# Patient Record
Sex: Male | Born: 1955 | Race: White | Hispanic: No | Smoking: Former smoker
Health system: Southern US, Community
[De-identification: ages and names within clinical notes are randomized; demographics above are authoritative.]

## PROBLEM LIST (undated history)

## (undated) DIAGNOSIS — E785 Hyperlipidemia, unspecified: Secondary | ICD-10-CM

## (undated) DIAGNOSIS — N200 Calculus of kidney: Secondary | ICD-10-CM

## (undated) DIAGNOSIS — I10 Essential (primary) hypertension: Secondary | ICD-10-CM

## (undated) DIAGNOSIS — K449 Diaphragmatic hernia without obstruction or gangrene: Secondary | ICD-10-CM

## (undated) DIAGNOSIS — I509 Heart failure, unspecified: Secondary | ICD-10-CM

## (undated) DIAGNOSIS — J939 Pneumothorax, unspecified: Secondary | ICD-10-CM

---

## 2008-06-29 ENCOUNTER — Emergency Department: Payer: Self-pay | Admitting: Emergency Medicine

## 2009-06-01 ENCOUNTER — Emergency Department: Payer: Self-pay | Admitting: Emergency Medicine

## 2009-07-06 ENCOUNTER — Ambulatory Visit: Payer: Self-pay | Admitting: Urology

## 2011-07-23 ENCOUNTER — Emergency Department: Payer: Self-pay | Admitting: Unknown Physician Specialty

## 2011-08-01 ENCOUNTER — Emergency Department: Payer: Self-pay | Admitting: Emergency Medicine

## 2012-06-28 IMAGING — CT CT HEAD WITHOUT CONTRAST
2 series · 16 of 30 positions shown, 20 images · non-contrast
Comparison: none

REASON FOR EXAM: fall
COMMENTS:

PROCEDURE:     CT  - CT HEAD WITHOUT CONTRAST  - August 01, 2011  [DATE]
RESULT:     Comparison:  06/01/2009
TECHNIQUE: Multiple axial images from the foramen magnum to the vertex were
obtained without IV contrast.

[Series 2: without · axial · non-contrast · 0.46mm/px · z∈[+328,+478]mm · 13 of 36 slices shown, 17 images]
[im 3/36  brain]
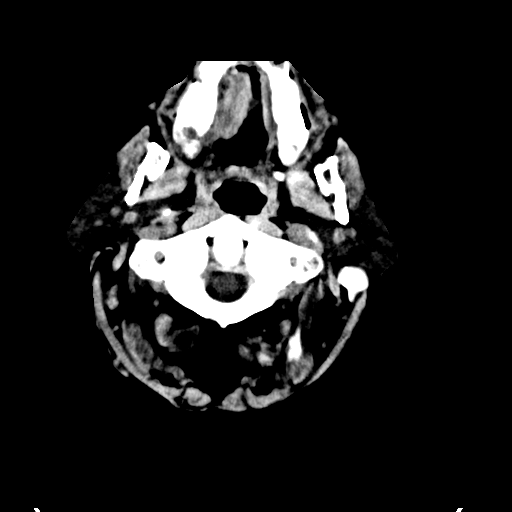
[im 3/36  bone]
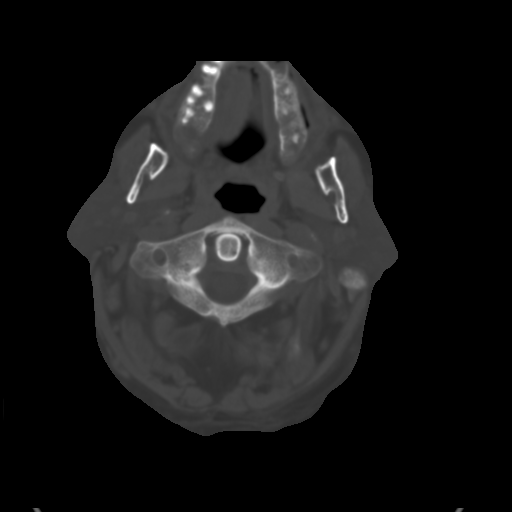
[im 6/36  brain]
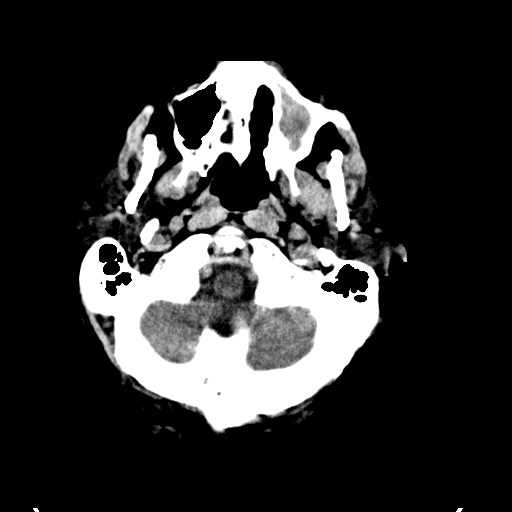
[im 8/36  brain]
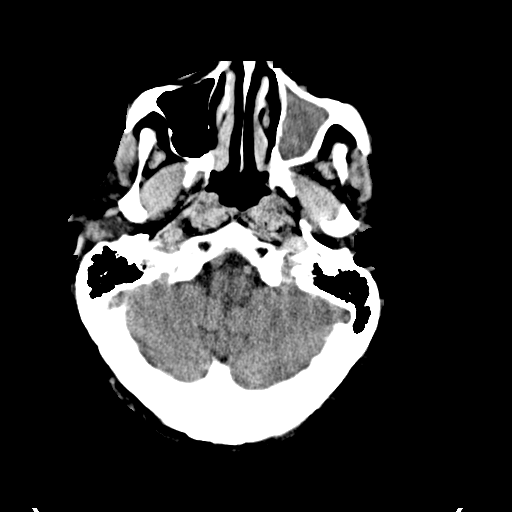
[im 11/36  brain]
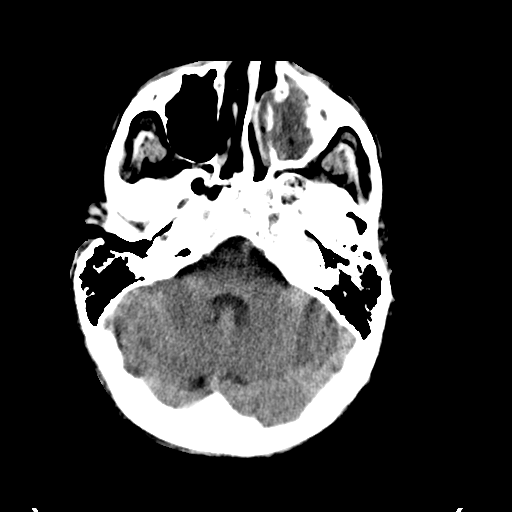
[im 13/36  brain]
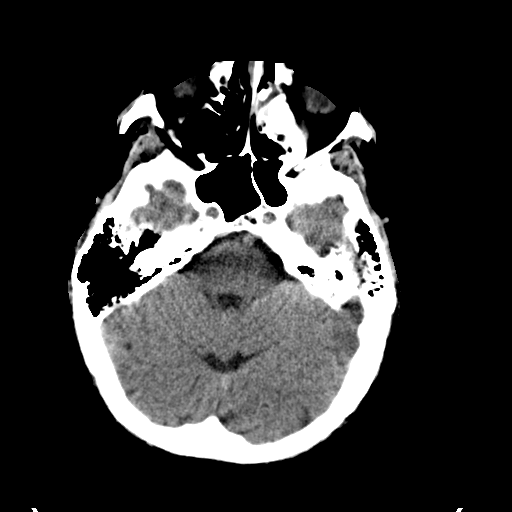
[im 13/36  bone]
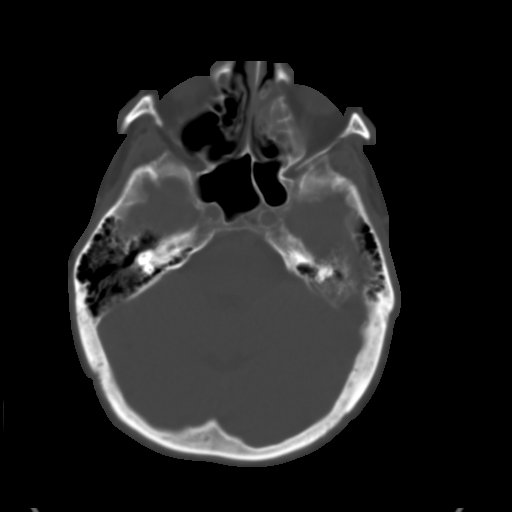
[im 16/36  brain]
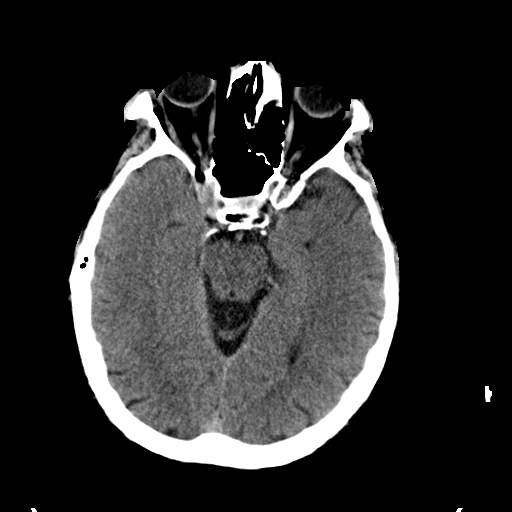
[im 18/36  brain]
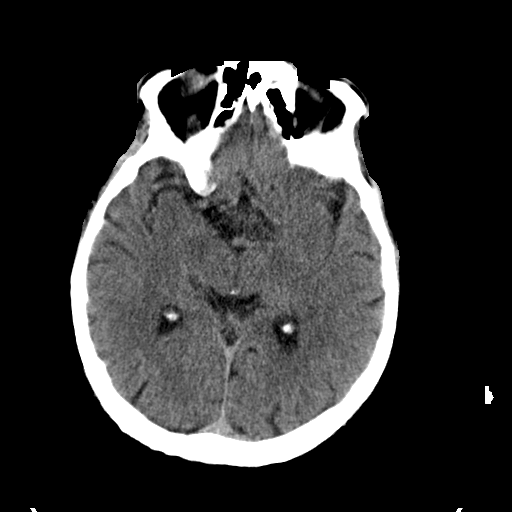
[im 21/36  brain]
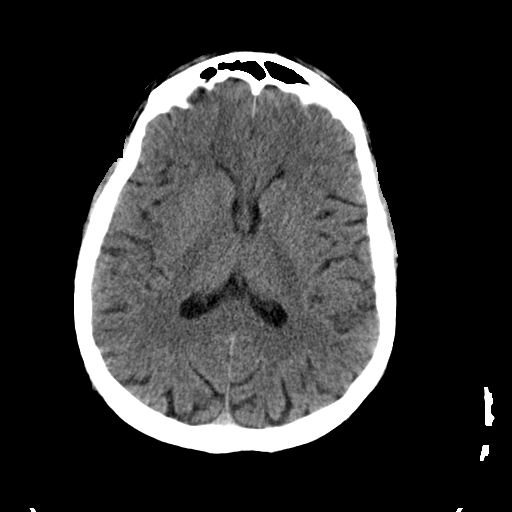
[im 23/36  brain]
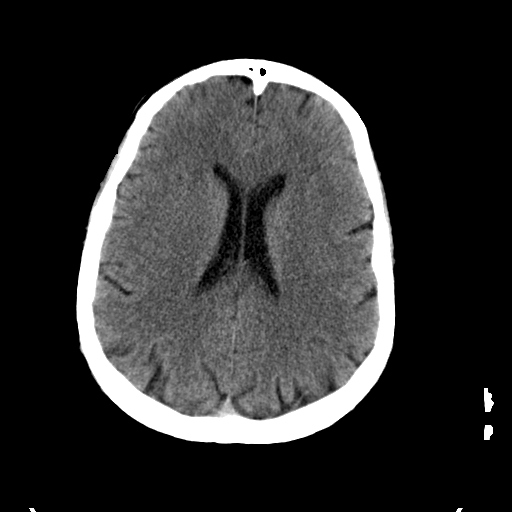
[im 23/36  bone]
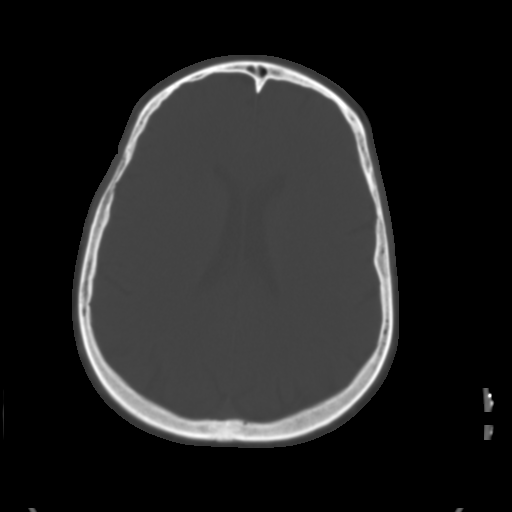
[im 26/36  brain]
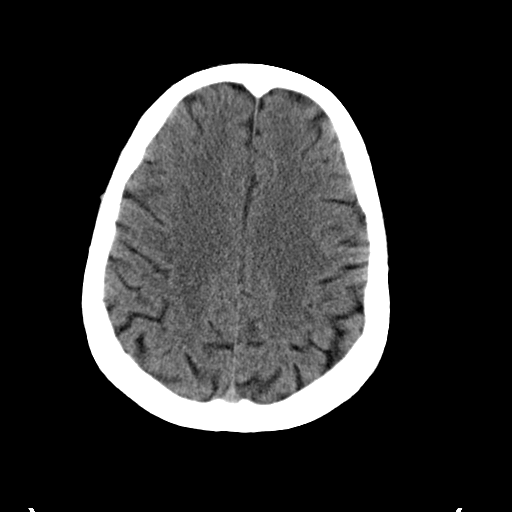
[im 28/36  brain]
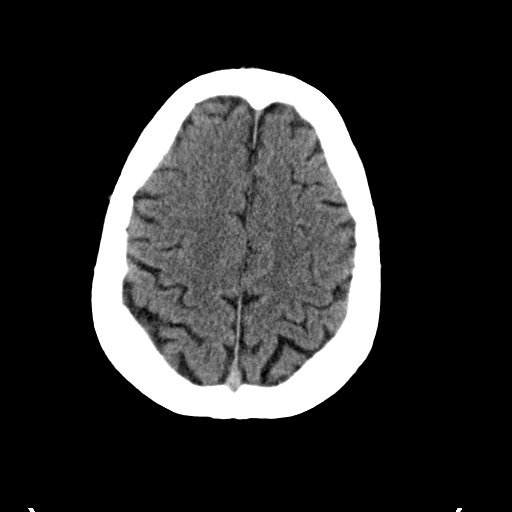
[im 31/36  brain]
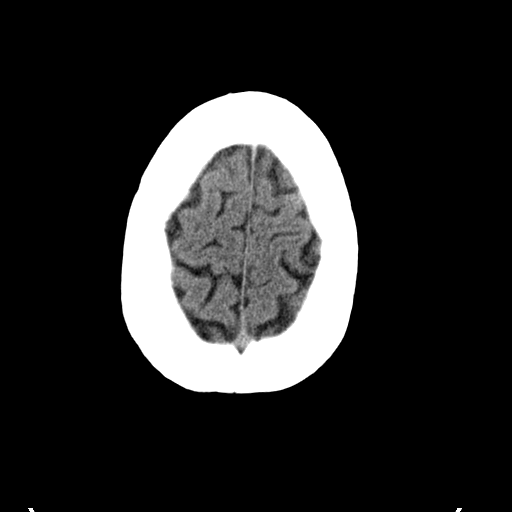
[im 33/36  brain]
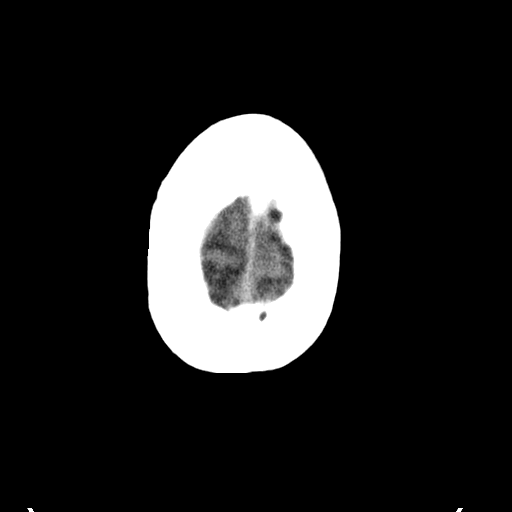
[im 33/36  bone]
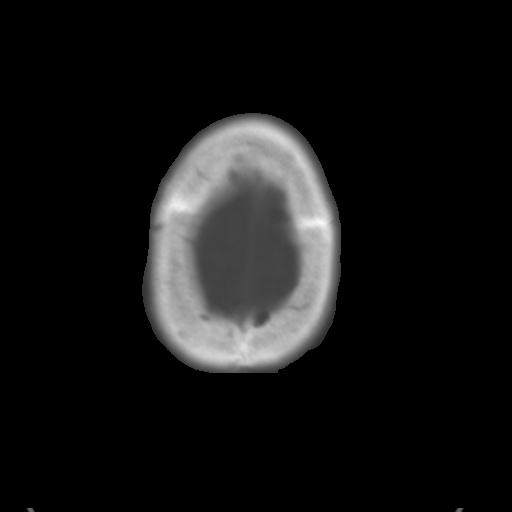

[Series 3: bone · axial · 0.46mm/px · z∈[+328,+378]mm · 3 of 36 slices shown]
[im 3/36  bone]
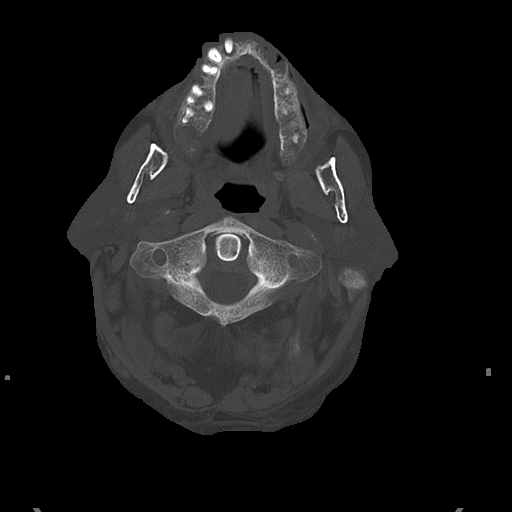
[im 8/36  bone]
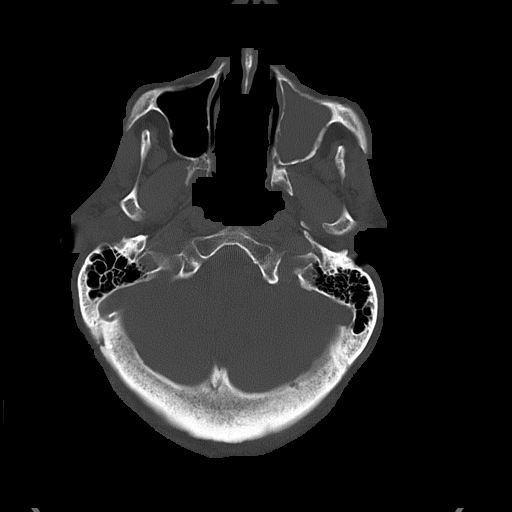
[im 13/36  bone]
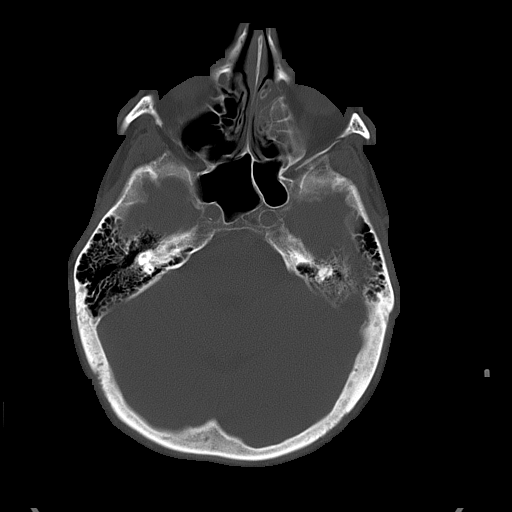

[16 of 30 positions shown; findings below may reference images not displayed]

FINDINGS: There is no evidence for mass effect, midline shift, or extra-axial fluid
collections. There is no evidence for space-occupying lesion, intracranial
hemorrhage, or cortical-based area of infarction.

There is complete opacification of the left maxillary sinus and mild
opacification of the left ethmoid air cells, which have increased from prior.

The osseous structures are unremarkable.
IMPRESSION: 1. No acute intracranial process.
2. Left paranasal sinus disease, increased from prior.

## 2020-07-01 ENCOUNTER — Emergency Department: Payer: Medicare Other

## 2020-07-01 ENCOUNTER — Other Ambulatory Visit: Payer: Self-pay

## 2020-07-01 ENCOUNTER — Encounter: Payer: Self-pay | Admitting: Emergency Medicine

## 2020-07-01 DIAGNOSIS — Y999 Unspecified external cause status: Secondary | ICD-10-CM | POA: Diagnosis not present

## 2020-07-01 DIAGNOSIS — W19XXXA Unspecified fall, initial encounter: Secondary | ICD-10-CM | POA: Diagnosis not present

## 2020-07-01 DIAGNOSIS — M79661 Pain in right lower leg: Secondary | ICD-10-CM | POA: Insufficient documentation

## 2020-07-01 DIAGNOSIS — Z5321 Procedure and treatment not carried out due to patient leaving prior to being seen by health care provider: Secondary | ICD-10-CM | POA: Insufficient documentation

## 2020-07-01 DIAGNOSIS — Y939 Activity, unspecified: Secondary | ICD-10-CM | POA: Insufficient documentation

## 2020-07-01 DIAGNOSIS — Y929 Unspecified place or not applicable: Secondary | ICD-10-CM | POA: Diagnosis not present

## 2020-07-01 NOTE — ED Triage Notes (Signed)
Pt fell yesterday, mechanical fall.  C/o pain in right calf described as cramping.  Varicose veins noted. Pain radiates from calf up leg when walking.  No hx dvt.  No redness. No swelling.

## 2020-07-02 ENCOUNTER — Emergency Department
Admission: EM | Admit: 2020-07-02 | Discharge: 2020-07-02 | Disposition: A | Discharge status: Home / Self Care | Payer: Medicare Other | Attending: Emergency Medicine | Admitting: Emergency Medicine

## 2020-07-02 HISTORY — DX: Calculus of kidney: N20.0

## 2021-05-29 IMAGING — US US EXTREM LOW VENOUS*R*
1 series · 14 of 24 positions shown · non-contrast
Comparison: None.

CLINICAL DATA: Pain

EXAM:
RIGHT LOWER EXTREMITY VENOUS DOPPLER ULTRASOUND
TECHNIQUE: Gray-scale sonography with compression, as well as color and duplex
ultrasound, were performed to evaluate the deep venous system(s)
from the level of the common femoral vein through the popliteal and
proximal calf veins.

[Series 1: us venous img lower uni right (dvt) · portal-venous · 14 of 34 slices shown]
[im 1/34]
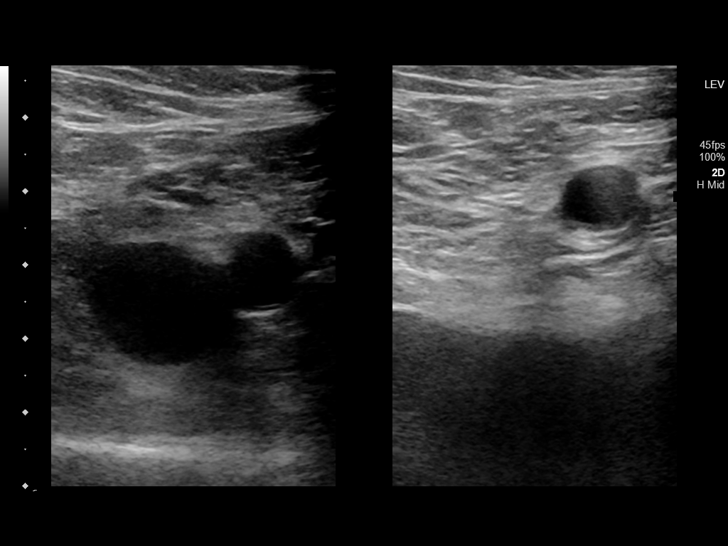
[im 3/34]
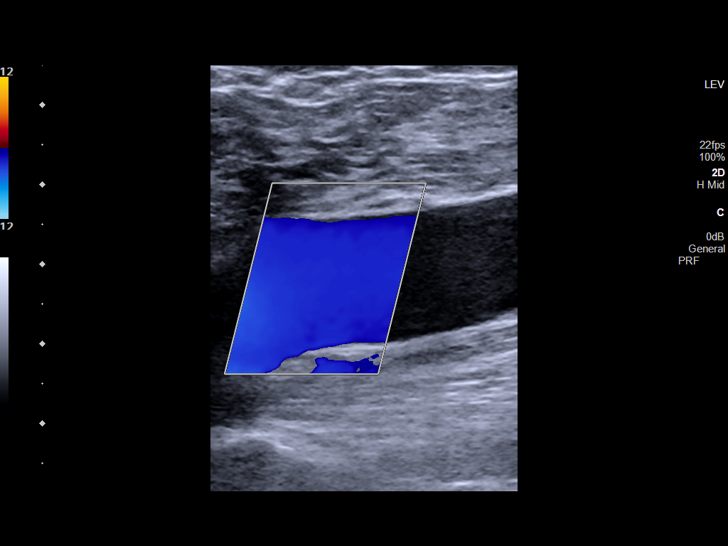
[im 6/34]
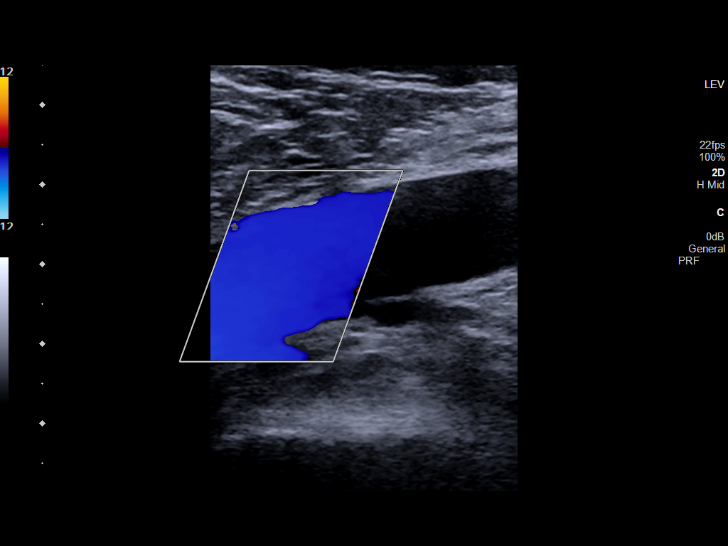
[im 9/34]
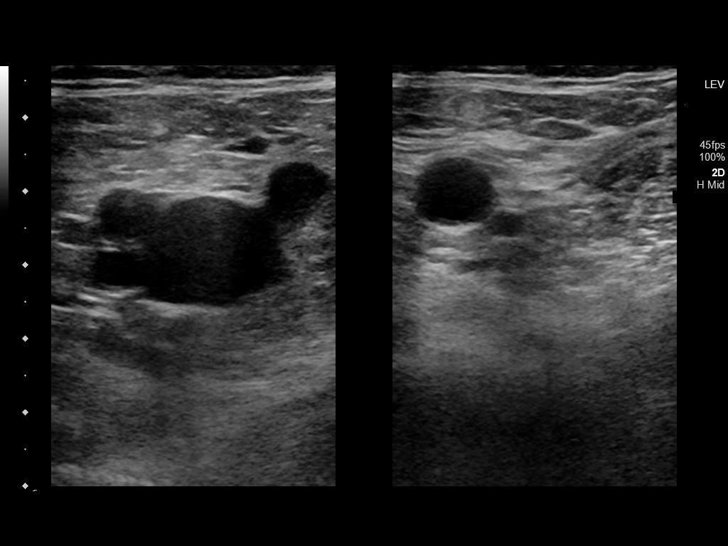
[im 11/34]
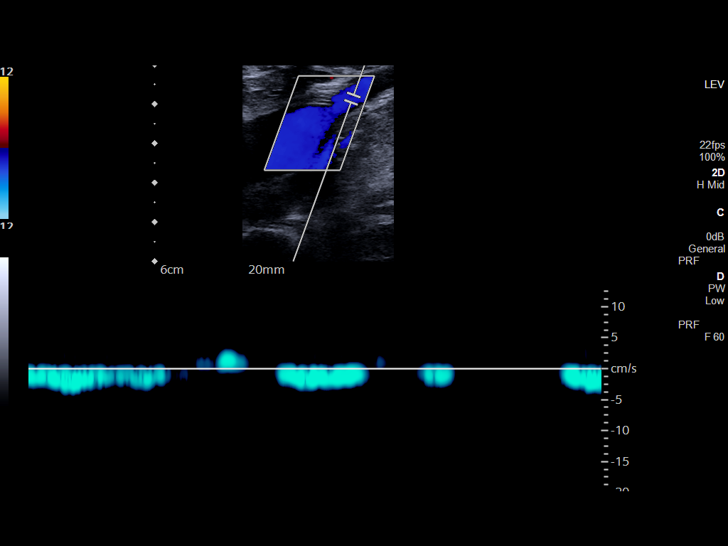
[im 13/34]
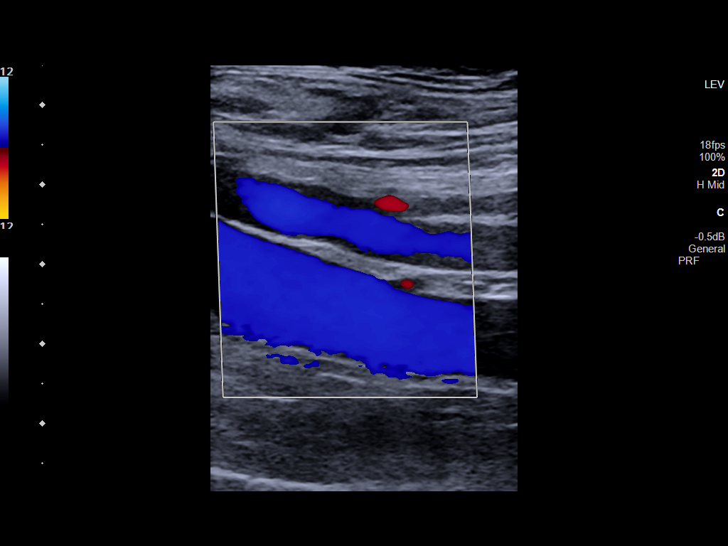
[im 16/34]
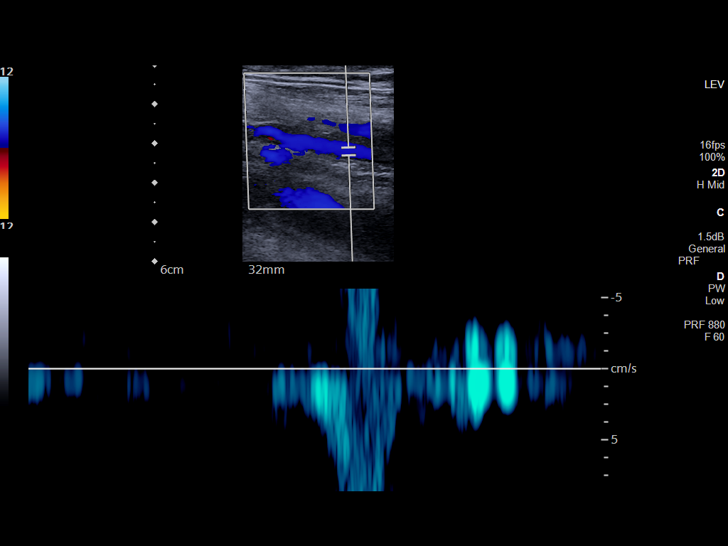
[im 18/34]
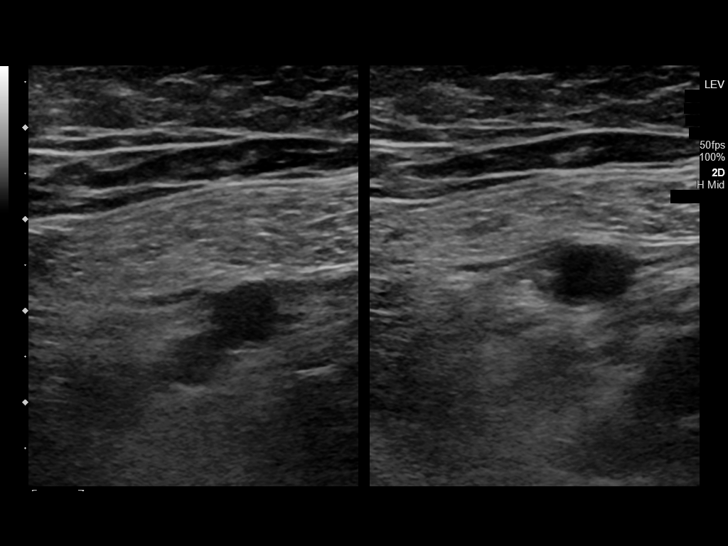
[im 21/34]
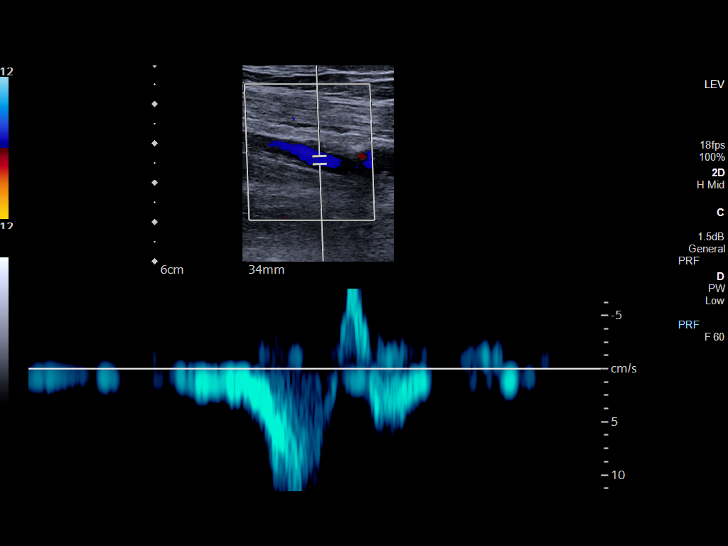
[im 23/34]
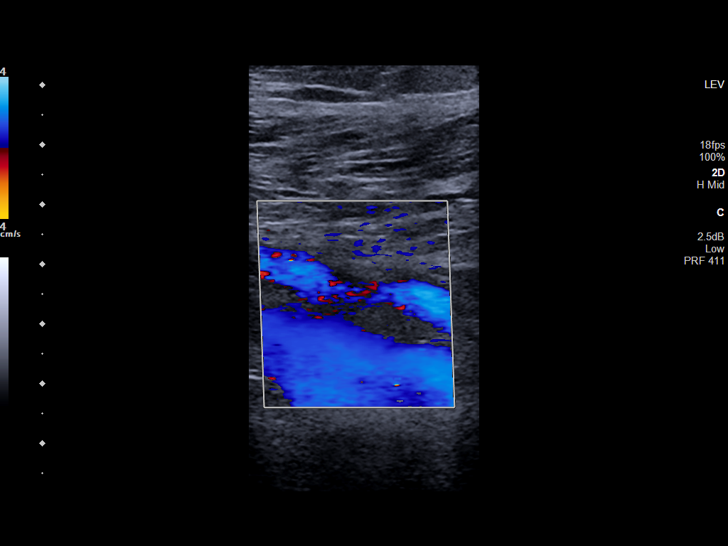
[im 26/34]
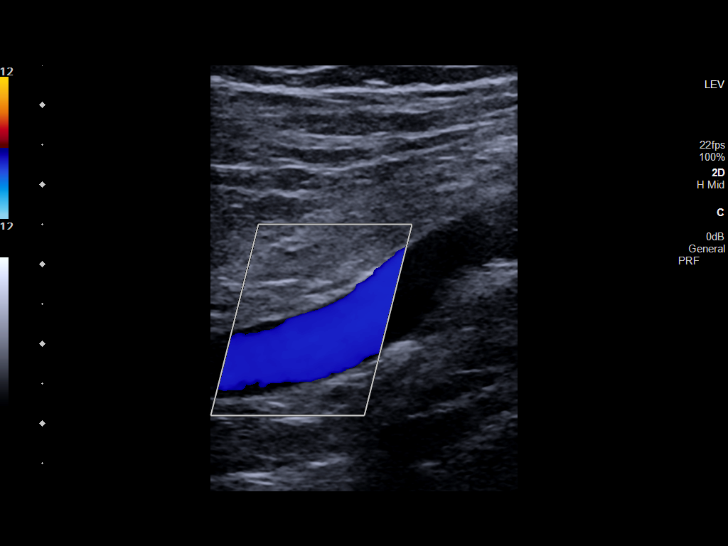
[im 28/34]
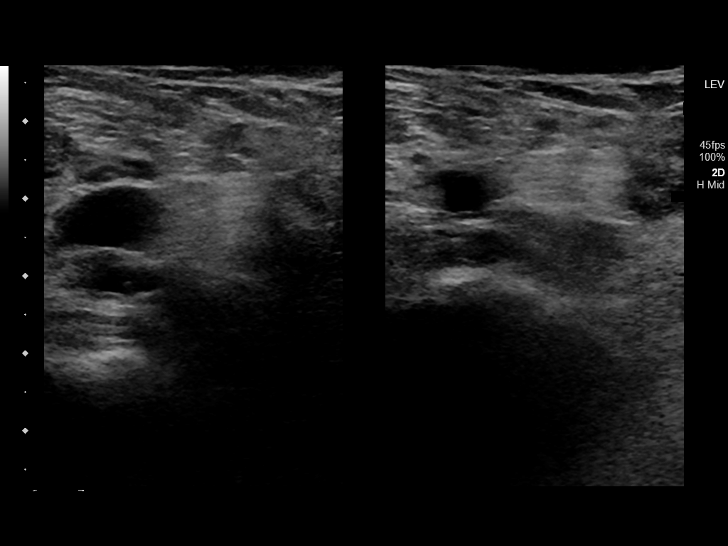
[im 31/34]
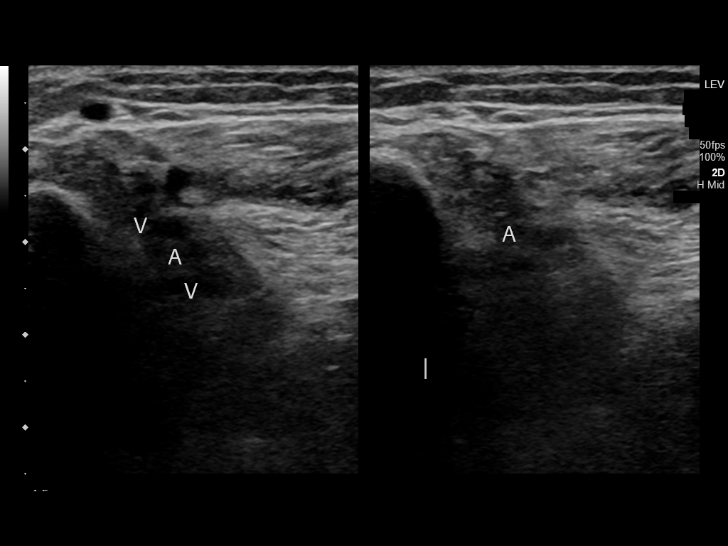
[im 34/34]
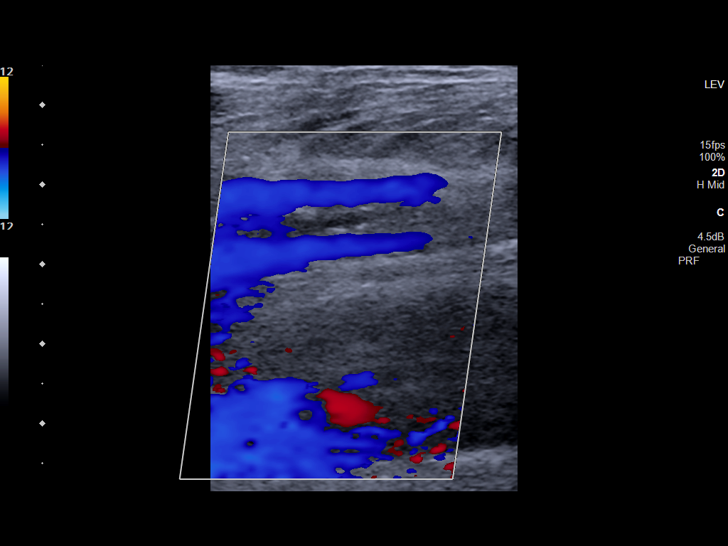

[14 of 24 positions shown; findings below may reference images not displayed]

FINDINGS: VENOUS

Normal compressibility of the common femoral, superficial femoral,
and popliteal veins, as well as the visualized calf veins.
Visualized portions of profunda femoral vein and great saphenous
vein unremarkable. No filling defects to suggest DVT on grayscale or
color Doppler imaging. Doppler waveforms show normal direction of
venous flow, normal respiratory plasticity and response to
augmentation.

Limited views of the contralateral common femoral vein are
unremarkable.

OTHER

None.

Limitations: none
IMPRESSION: No DVT of the RIGHT lower extremity.

## 2023-04-28 ENCOUNTER — Encounter: Payer: Self-pay | Admitting: Registered Nurse

## 2023-04-28 ENCOUNTER — Inpatient Hospital Stay
Admission: EM | Admit: 2023-04-28 | Discharge: 2023-05-06 | DRG: 321 | Disposition: A | Discharge status: Home / Self Care | Payer: Medicare HMO | Attending: Internal Medicine | Admitting: Internal Medicine

## 2023-04-28 ENCOUNTER — Emergency Department: Payer: Medicare HMO

## 2023-04-28 ENCOUNTER — Encounter
Admission: EM | Disposition: A | Discharge status: Home / Self Care | Payer: Self-pay | Source: Home / Self Care | Attending: Internal Medicine

## 2023-04-28 ENCOUNTER — Inpatient Hospital Stay: Payer: Medicare HMO

## 2023-04-28 DIAGNOSIS — I959 Hypotension, unspecified: Secondary | ICD-10-CM | POA: Diagnosis not present

## 2023-04-28 DIAGNOSIS — D649 Anemia, unspecified: Secondary | ICD-10-CM | POA: Diagnosis not present

## 2023-04-28 DIAGNOSIS — K8020 Calculus of gallbladder without cholecystitis without obstruction: Secondary | ICD-10-CM | POA: Diagnosis present

## 2023-04-28 DIAGNOSIS — I214 Non-ST elevation (NSTEMI) myocardial infarction: Secondary | ICD-10-CM

## 2023-04-28 DIAGNOSIS — I5023 Acute on chronic systolic (congestive) heart failure: Secondary | ICD-10-CM | POA: Diagnosis present

## 2023-04-28 DIAGNOSIS — I110 Hypertensive heart disease with heart failure: Secondary | ICD-10-CM | POA: Diagnosis present

## 2023-04-28 DIAGNOSIS — Z87440 Personal history of urinary (tract) infections: Secondary | ICD-10-CM

## 2023-04-28 DIAGNOSIS — R451 Restlessness and agitation: Secondary | ICD-10-CM | POA: Diagnosis not present

## 2023-04-28 DIAGNOSIS — R079 Chest pain, unspecified: Secondary | ICD-10-CM | POA: Diagnosis not present

## 2023-04-28 DIAGNOSIS — I340 Nonrheumatic mitral (valve) insufficiency: Secondary | ICD-10-CM | POA: Diagnosis present

## 2023-04-28 DIAGNOSIS — N1330 Unspecified hydronephrosis: Secondary | ICD-10-CM | POA: Diagnosis not present

## 2023-04-28 DIAGNOSIS — Z87442 Personal history of urinary calculi: Secondary | ICD-10-CM

## 2023-04-28 DIAGNOSIS — Z1152 Encounter for screening for COVID-19: Secondary | ICD-10-CM | POA: Diagnosis not present

## 2023-04-28 DIAGNOSIS — F419 Anxiety disorder, unspecified: Secondary | ICD-10-CM | POA: Diagnosis present

## 2023-04-28 DIAGNOSIS — K449 Diaphragmatic hernia without obstruction or gangrene: Secondary | ICD-10-CM | POA: Diagnosis present

## 2023-04-28 DIAGNOSIS — I509 Heart failure, unspecified: Secondary | ICD-10-CM | POA: Diagnosis not present

## 2023-04-28 DIAGNOSIS — Z79899 Other long term (current) drug therapy: Secondary | ICD-10-CM

## 2023-04-28 DIAGNOSIS — I2510 Atherosclerotic heart disease of native coronary artery without angina pectoris: Secondary | ICD-10-CM | POA: Diagnosis not present

## 2023-04-28 DIAGNOSIS — Z87891 Personal history of nicotine dependence: Secondary | ICD-10-CM

## 2023-04-28 DIAGNOSIS — T501X5A Adverse effect of loop [high-ceiling] diuretics, initial encounter: Secondary | ICD-10-CM | POA: Diagnosis not present

## 2023-04-28 DIAGNOSIS — I5021 Acute systolic (congestive) heart failure: Secondary | ICD-10-CM

## 2023-04-28 DIAGNOSIS — J159 Unspecified bacterial pneumonia: Secondary | ICD-10-CM | POA: Diagnosis present

## 2023-04-28 DIAGNOSIS — N179 Acute kidney failure, unspecified: Secondary | ICD-10-CM | POA: Diagnosis not present

## 2023-04-28 DIAGNOSIS — I452 Bifascicular block: Secondary | ICD-10-CM | POA: Diagnosis present

## 2023-04-28 DIAGNOSIS — J069 Acute upper respiratory infection, unspecified: Secondary | ICD-10-CM | POA: Diagnosis present

## 2023-04-28 DIAGNOSIS — R000 Tachycardia, unspecified: Secondary | ICD-10-CM | POA: Diagnosis present

## 2023-04-28 DIAGNOSIS — I10 Essential (primary) hypertension: Secondary | ICD-10-CM | POA: Diagnosis present

## 2023-04-28 DIAGNOSIS — E1165 Type 2 diabetes mellitus with hyperglycemia: Secondary | ICD-10-CM | POA: Diagnosis present

## 2023-04-28 DIAGNOSIS — N136 Pyonephrosis: Secondary | ICD-10-CM | POA: Diagnosis present

## 2023-04-28 DIAGNOSIS — I259 Chronic ischemic heart disease, unspecified: Secondary | ICD-10-CM | POA: Diagnosis present

## 2023-04-28 DIAGNOSIS — I255 Ischemic cardiomyopathy: Secondary | ICD-10-CM | POA: Diagnosis present

## 2023-04-28 DIAGNOSIS — E872 Acidosis, unspecified: Secondary | ICD-10-CM | POA: Diagnosis not present

## 2023-04-28 DIAGNOSIS — N200 Calculus of kidney: Secondary | ICD-10-CM | POA: Diagnosis not present

## 2023-04-28 DIAGNOSIS — R062 Wheezing: Secondary | ICD-10-CM | POA: Diagnosis present

## 2023-04-28 DIAGNOSIS — R739 Hyperglycemia, unspecified: Secondary | ICD-10-CM | POA: Diagnosis present

## 2023-04-28 DIAGNOSIS — R7989 Other specified abnormal findings of blood chemistry: Secondary | ICD-10-CM

## 2023-04-28 DIAGNOSIS — Z7982 Long term (current) use of aspirin: Secondary | ICD-10-CM

## 2023-04-28 DIAGNOSIS — Z7901 Long term (current) use of anticoagulants: Secondary | ICD-10-CM

## 2023-04-28 DIAGNOSIS — I25118 Atherosclerotic heart disease of native coronary artery with other forms of angina pectoris: Secondary | ICD-10-CM | POA: Diagnosis not present

## 2023-04-28 DIAGNOSIS — R0602 Shortness of breath: Secondary | ICD-10-CM | POA: Diagnosis present

## 2023-04-28 DIAGNOSIS — R072 Precordial pain: Secondary | ICD-10-CM | POA: Insufficient documentation

## 2023-04-28 DIAGNOSIS — J810 Acute pulmonary edema: Secondary | ICD-10-CM | POA: Diagnosis not present

## 2023-04-28 LAB — CBC
HCT: 39.8 % (ref 39.0–52.0)
Hemoglobin: 12.8 g/dL — ABNORMAL LOW (ref 13.0–17.0)
MCH: 32.2 pg (ref 26.0–34.0)
MCHC: 32.2 g/dL (ref 30.0–36.0)
MCV: 100 fL (ref 80.0–100.0)
Platelets: 255 10*3/uL (ref 150–400)
RBC: 3.98 MIL/uL — ABNORMAL LOW (ref 4.22–5.81)
RDW: 13.3 % (ref 11.5–15.5)
WBC: 14.4 10*3/uL — ABNORMAL HIGH (ref 4.0–10.5)
nRBC: 0 % (ref 0.0–0.2)

## 2023-04-28 LAB — BASIC METABOLIC PANEL
Anion gap: 12 (ref 5–15)
BUN: 30 mg/dL — ABNORMAL HIGH (ref 8–23)
CO2: 22 mmol/L (ref 22–32)
Calcium: 9.8 mg/dL (ref 8.9–10.3)
Chloride: 104 mmol/L (ref 98–111)
Creatinine, Ser: 1.21 mg/dL (ref 0.61–1.24)
GFR, Estimated: >60 mL/min (ref >60)
Glucose, Bld: 293 mg/dL — ABNORMAL HIGH (ref 70–99)
Potassium: 4.1 mmol/L (ref 3.5–5.1)
Sodium: 138 mmol/L (ref 135–145)

## 2023-04-28 LAB — RESPIRATORY PANEL BY PCR

## 2023-04-28 LAB — HEPATIC FUNCTION PANEL
ALT: 22 U/L (ref 0–44)
AST: 32 U/L (ref 15–41)
Albumin: 4.2 g/dL (ref 3.5–5.0)
Alkaline Phosphatase: 101 U/L (ref 38–126)
Bilirubin, Direct: 0.1 mg/dL (ref 0.0–0.2)
Indirect Bilirubin: 0.7 mg/dL (ref 0.3–0.9)
Total Bilirubin: 0.8 mg/dL (ref 0.3–1.2)
Total Protein: 7.7 g/dL (ref 6.5–8.1)

## 2023-04-28 LAB — LACTIC ACID, PLASMA
Lactic Acid, Venous: 2.5 mmol/L (ref 0.5–1.9)
Lactic Acid, Venous: 2.6 mmol/L (ref 0.5–1.9)

## 2023-04-28 LAB — TSH: TSH: 4.483 u[IU]/mL (ref 0.350–4.500)

## 2023-04-28 LAB — GLUCOSE, CAPILLARY
Glucose-Capillary: 134 mg/dL — ABNORMAL HIGH (ref 70–99)
Glucose-Capillary: 132 mg/dL — ABNORMAL HIGH (ref 70–99)
Glucose-Capillary: 115 mg/dL — ABNORMAL HIGH (ref 70–99)

## 2023-04-28 LAB — BRAIN NATRIURETIC PEPTIDE: B Natriuretic Peptide: 530.9 pg/mL — ABNORMAL HIGH (ref 0.0–100.0)

## 2023-04-28 LAB — MRSA NEXT GEN BY PCR, NASAL: MRSA by PCR Next Gen: NOT DETECTED

## 2023-04-28 LAB — LIPASE, BLOOD: Lipase: 29 U/L (ref 11–51)

## 2023-04-28 LAB — TROPONIN I (HIGH SENSITIVITY)
Troponin I (High Sensitivity): 54 ng/L — ABNORMAL HIGH (ref <18)
Troponin I (High Sensitivity): 234 ng/L (ref <18)

## 2023-04-28 LAB — SARS CORONAVIRUS 2 BY RT PCR: SARS Coronavirus 2 by RT PCR: NEGATIVE

## 2023-04-28 SURGERY — CYSTOSCOPY, WITH STENT INSERTION
Anesthesia: General | Laterality: Left

## 2023-04-28 MED ORDER — IOHEXOL 350 MG/ML SOLN
75.0000 mL | Freq: Once | INTRAVENOUS | Status: AC | PRN
  Administered 2023-04-28: 75 mL via INTRAVENOUS

## 2023-04-28 MED ORDER — THIAMINE HCL 100 MG/ML IJ SOLN
100.0000 mg | Freq: Every day | INTRAMUSCULAR | Status: DC
  Administered 2023-04-28 – 2023-05-03 (×6): 100 mg via INTRAVENOUS
  Filled 2023-04-28 (×6): qty 2

## 2023-04-28 MED ORDER — SODIUM CHLORIDE 0.9 % IV SOLN
500.0000 mg | Freq: Once | INTRAVENOUS | Status: AC
  Administered 2023-04-28: 500 mg via INTRAVENOUS
  Filled 2023-04-28: qty 5

## 2023-04-28 MED ORDER — INSULIN ASPART 100 UNIT/ML IJ SOLN
0.0000 [IU] | INTRAMUSCULAR | Status: DC
  Administered 2023-04-28 – 2023-05-02 (×12): 2 [IU] via SUBCUTANEOUS
  Filled 2023-04-28 (×11): qty 1

## 2023-04-28 MED ORDER — SODIUM CHLORIDE 0.9 % IV BOLUS: 1000.0000 mL | Freq: Once | INTRAVENOUS | Status: DC

## 2023-04-28 MED ORDER — ONDANSETRON HCL 4 MG/2ML IJ SOLN: 4.0000 mg | Freq: Four times a day (QID) | INTRAMUSCULAR | Status: DC | PRN

## 2023-04-28 MED ORDER — SODIUM CHLORIDE 0.9 % IV SOLN
1.0000 g | Freq: Once | INTRAVENOUS | Status: AC
  Administered 2023-04-28: 1 g via INTRAVENOUS
  Filled 2023-04-28: qty 10

## 2023-04-28 MED ORDER — METOPROLOL TARTRATE 25 MG PO TABS
25.0000 mg | ORAL_TABLET | Freq: Four times a day (QID) | ORAL | Status: DC
  Administered 2023-04-28 – 2023-04-29 (×3): 25 mg via ORAL
  Filled 2023-04-28 (×4): qty 1

## 2023-04-28 MED ORDER — INSULIN DETEMIR 100 UNIT/ML ~~LOC~~ SOLN
5.0000 [IU] | Freq: Every day | SUBCUTANEOUS | Status: DC
  Administered 2023-04-28 – 2023-05-01 (×4): 5 [IU] via SUBCUTANEOUS
  Filled 2023-04-28 (×5): qty 0.05

## 2023-04-28 MED ORDER — FENTANYL CITRATE PF 50 MCG/ML IJ SOSY
12.5000 ug | PREFILLED_SYRINGE | INTRAMUSCULAR | Status: DC | PRN
  Administered 2023-04-28 – 2023-05-03 (×18): 50 ug via INTRAVENOUS
  Filled 2023-04-28 (×18): qty 1

## 2023-04-28 MED ORDER — POLYETHYLENE GLYCOL 3350 17 G PO PACK
17.0000 g | PACK | Freq: Every day | ORAL | Status: DC | PRN
  Administered 2023-05-04 – 2023-05-05 (×2): 17 g via ORAL
  Filled 2023-04-28 (×2): qty 1

## 2023-04-28 MED ORDER — ACETAMINOPHEN 325 MG PO TABS
650.0000 mg | ORAL_TABLET | Freq: Four times a day (QID) | ORAL | Status: DC | PRN
  Administered 2023-04-28 – 2023-05-06 (×5): 650 mg via ORAL
  Filled 2023-04-28 (×5): qty 2

## 2023-04-28 MED ORDER — SODIUM CHLORIDE 0.9 % IV BOLUS
2000.0000 mL | Freq: Once | INTRAVENOUS | Status: AC
  Administered 2023-04-28: 2000 mL via INTRAVENOUS

## 2023-04-28 MED ORDER — HEPARIN (PORCINE) 25000 UT/250ML-% IV SOLN
1600.0000 [IU]/h | INTRAVENOUS | Status: DC
  Administered 2023-04-28: 1100 [IU]/h via INTRAVENOUS
  Administered 2023-04-30 (×2): 1600 [IU]/h via INTRAVENOUS
  Filled 2023-04-28 (×4): qty 250

## 2023-04-28 MED ORDER — SODIUM CHLORIDE 0.9 % IV BOLUS: 500.0000 mL | Freq: Once | INTRAVENOUS | Status: DC

## 2023-04-28 MED ORDER — AMLODIPINE BESYLATE 5 MG PO TABS
5.0000 mg | ORAL_TABLET | Freq: Two times a day (BID) | ORAL | Status: DC
  Administered 2023-04-28 – 2023-04-29 (×2): 5 mg via ORAL
  Filled 2023-04-28 (×2): qty 1

## 2023-04-28 MED ORDER — CHLORHEXIDINE GLUCONATE CLOTH 2 % EX PADS
6.0000 | MEDICATED_PAD | Freq: Every day | CUTANEOUS | Status: DC
  Administered 2023-04-28 – 2023-05-02 (×5): 6 via TOPICAL

## 2023-04-28 MED ORDER — FOLIC ACID 5 MG/ML IJ SOLN
1.0000 mg | Freq: Every day | INTRAMUSCULAR | Status: DC
  Administered 2023-04-29 – 2023-05-03 (×5): 1 mg via INTRAVENOUS
  Filled 2023-04-28 (×6): qty 0.2

## 2023-04-28 MED ORDER — ALPRAZOLAM 0.25 MG PO TABS
0.2500 mg | ORAL_TABLET | Freq: Once | ORAL | Status: AC
  Administered 2023-04-28: 0.25 mg via ORAL
  Filled 2023-04-28: qty 1

## 2023-04-28 MED ORDER — ACETAMINOPHEN 650 MG RE SUPP: 650.0000 mg | Freq: Four times a day (QID) | RECTAL | Status: DC | PRN

## 2023-04-28 MED ORDER — SODIUM CHLORIDE 0.9 % IV SOLN: INTRAVENOUS | Status: DC

## 2023-04-28 MED ORDER — IPRATROPIUM BROMIDE 0.02 % IN SOLN
0.5000 mg | Freq: Four times a day (QID) | RESPIRATORY_TRACT | Status: DC
  Administered 2023-04-28: 0.5 mg via RESPIRATORY_TRACT
  Filled 2023-04-28: qty 2.5

## 2023-04-28 MED ORDER — ASPIRIN 81 MG PO CHEW
324.0000 mg | CHEWABLE_TABLET | Freq: Once | ORAL | Status: AC
  Administered 2023-04-28: 324 mg via ORAL
  Filled 2023-04-28: qty 4

## 2023-04-28 MED ORDER — HEPARIN BOLUS VIA INFUSION
4000.0000 [IU] | Freq: Once | INTRAVENOUS | Status: AC
  Administered 2023-04-28: 4000 [IU] via INTRAVENOUS
  Filled 2023-04-28: qty 4000

## 2023-04-28 MED ORDER — MORPHINE SULFATE (PF) 4 MG/ML IV SOLN
4.0000 mg | Freq: Once | INTRAVENOUS | Status: AC
  Administered 2023-04-28: 4 mg via INTRAVENOUS
  Filled 2023-04-28: qty 1

## 2023-04-28 MED ORDER — SODIUM CHLORIDE 0.9% FLUSH
3.0000 mL | Freq: Two times a day (BID) | INTRAVENOUS | Status: DC
  Administered 2023-04-29 – 2023-04-30 (×3): 3 mL via INTRAVENOUS

## 2023-04-28 MED ORDER — OXYCODONE HCL 5 MG PO TABS
5.0000 mg | ORAL_TABLET | ORAL | Status: DC | PRN
  Administered 2023-04-28 – 2023-05-05 (×20): 5 mg via ORAL
  Filled 2023-04-28 (×20): qty 1

## 2023-04-28 MED ORDER — METOPROLOL TARTRATE 5 MG/5ML IV SOLN
5.0000 mg | Freq: Four times a day (QID) | INTRAVENOUS | Status: DC | PRN
  Filled 2023-04-28 (×2): qty 5

## 2023-04-28 MED ORDER — ONDANSETRON HCL 4 MG PO TABS
4.0000 mg | ORAL_TABLET | Freq: Four times a day (QID) | ORAL | Status: DC | PRN
  Administered 2023-05-05: 4 mg via ORAL
  Filled 2023-04-28: qty 1

## 2023-04-28 SURGICAL SUPPLY — 20 items
BAG DRAIN SIEMENS DORNER NS (MISCELLANEOUS) ×1 IMPLANT
BRUSH SCRUB EZ 1% IODOPHOR (MISCELLANEOUS) IMPLANT
CATH URETL OPEN 5X70 (CATHETERS) ×1 IMPLANT
GLOVE BIOGEL PI IND STRL 7.5 (GLOVE) ×1 IMPLANT
GOWN STRL REUS W/ TWL LRG LVL3 (GOWN DISPOSABLE) ×1 IMPLANT
GOWN STRL REUS W/ TWL XL LVL3 (GOWN DISPOSABLE) ×1 IMPLANT
GOWN STRL REUS W/TWL LRG LVL3 (GOWN DISPOSABLE) ×1
GOWN STRL REUS W/TWL XL LVL3 (GOWN DISPOSABLE) ×1
GUIDEWIRE STR DUAL SENSOR (WIRE) ×1 IMPLANT
IV NS IRRIG 3000ML ARTHROMATIC (IV SOLUTION) ×1 IMPLANT
KIT TURNOVER CYSTO (KITS) ×1 IMPLANT
PACK CYSTO AR (MISCELLANEOUS) ×1 IMPLANT
SET CYSTO W/LG BORE CLAMP LF (SET/KITS/TRAYS/PACK) ×1 IMPLANT
STENT URET 6FRX24 CONTOUR (STENTS) IMPLANT
STENT URET 6FRX26 CONTOUR (STENTS) IMPLANT
SURGILUBE 2OZ TUBE FLIPTOP (MISCELLANEOUS) ×1 IMPLANT
SYR TOOMEY IRRIG 70ML (MISCELLANEOUS)
SYRINGE TOOMEY IRRIG 70ML (MISCELLANEOUS) IMPLANT
WATER STERILE IRR 1000ML POUR (IV SOLUTION) ×1 IMPLANT
WATER STERILE IRR 500ML POUR (IV SOLUTION) ×1 IMPLANT

## 2023-04-28 NOTE — Assessment & Plan Note (Addendum)
Oxygen requirement questionably related to pulmonary edema versus community-acquired pneumonia versus COPD versus all of the above IV Rocephin and azithromycin Respiratory cultures pending O2 as needed Albuterol as needed

## 2023-04-28 NOTE — ED Notes (Signed)
Patient transported to CT 

## 2023-04-28 NOTE — Assessment & Plan Note (Signed)
Begin metoprolol for rate control as well as BP control

## 2023-04-28 NOTE — Consult Note (Signed)
ANTICOAGULATION CONSULT NOTE - Initial Consult  Pharmacy Consult for Heparin Infusion  Indication: chest pain/ACS  No Known Allergies  Patient Measurements: Height: 5\' 9"  (175.3 cm) Weight: 95.3 kg (210 lb) IBW/kg (Calculated) : 70.7 Heparin Dosing Weight: 90.4 kg  Vital Signs: Temp: 97.7 F (36.5 C) (06/07 1608) Temp Source: Oral (06/07 1608) BP: 137/81 (06/07 1609) Pulse Rate: 101 (06/07 1619)  Labs: Recent Labs    04/28/23 1130 04/28/23 1347  HGB 12.8*  --   HCT 39.8  --   PLT 255  --   CREATININE 1.21  --   TROPONINIHS 54* 234*    Estimated Creatinine Clearance: 67.5 mL/min (by C-G formula based on SCr of 1.21 mg/dL).   Medical History: Past Medical History:  Diagnosis Date   Kidney stones     Medications:  Scheduled:   Chlorhexidine Gluconate Cloth  6 each Topical Daily   folic acid  1 mg Intravenous Daily   insulin aspart  0-15 Units Subcutaneous Q4H   insulin detemir  5 Units Subcutaneous QHS   ipratropium  0.5 mg Nebulization Q6H   metoprolol tartrate  25 mg Oral Q6H   sodium chloride flush  3 mL Intravenous Q12H   thiamine (VITAMIN B1) injection  100 mg Intravenous Daily   Infusions:   sodium chloride 75 mL/hr at 04/28/23 1711   PRN: acetaminophen **OR** acetaminophen, fentaNYL (SUBLIMAZE) injection, metoprolol tartrate, ondansetron **OR** ondansetron (ZOFRAN) IV, oxyCODONE, polyethylene glycol  Assessment: Ronnie Mann is a 67 y.o. male presenting with chest pain. PMH significant for T2DM, HTN. Patient was not on [facility] PTA per chart review. Pharmacy has been consulted to initiate and manage heparin infusion.   Baseline Labs: Hgb 12.8, Hct 39.8, Plt 255  Baseline aPTT, PT, INR not taken prior to the start of the infusion  Goal of Therapy:  Heparin level 0.3-0.7 units/ml Monitor platelets by anticoagulation protocol: Yes   Plan:  Give 4000 units bolus x 1 Start heparin infusion at 1100 units/hr Check HL in 6 hours  Continue to monitor H&H  and platelets daily while on heparin infusion   Celene Squibb, PharmD PGY1 Pharmacy Resident 04/28/2023 5:36 PM

## 2023-04-28 NOTE — Assessment & Plan Note (Signed)
Per the patient had ongoing URI congestion, runny nose the week prior to admission.

## 2023-04-28 NOTE — Anesthesia Preprocedure Evaluation (Deleted)
Anesthesia Evaluation  Patient identified by MRN, date of birth, ID band Patient awake    Reviewed: Allergy & Precautions, H&P , NPO status , Patient's Chart, lab work & pertinent test results  Airway Mallampati: II  TM Distance: >3 FB Neck ROM: full    Dental no notable dental hx.    Pulmonary neg pulmonary ROS   Pulmonary exam normal        Cardiovascular hypertension, +CHF  Normal cardiovascular exam  Trop 234<54  EKG: Sinus tachycardia Right bundle branch block Left anterior fascicular block Bifascicular block   Neuro/Psych negative neurological ROS  negative psych ROS   GI/Hepatic Neg liver ROS, hiatal hernia (Large),,,  Endo/Other  diabetes    Renal/GU Renal disease (L stone)     Musculoskeletal   Abdominal   Peds  Hematology negative hematology ROS (+)   Anesthesia Other Findings Past Medical History: No date: Kidney stones  No past surgical history on file.  BMI    Body Mass Index: 31.01 kg/m      Reproductive/Obstetrics negative OB ROS                              Anesthesia Physical Anesthesia Plan  ASA:   Anesthesia Plan: General ETT   Post-op Pain Management:    Induction:   PONV Risk Score and Plan: 2 and Ondansetron, Dexamethasone and Midazolam  Airway Management Planned:   Additional Equipment:   Intra-op Plan:   Post-operative Plan:   Informed Consent:      Dental Advisory Given  Plan Discussed with: CRNA and Surgeon  Anesthesia Plan Comments:          Anesthesia Quick Evaluation

## 2023-04-28 NOTE — ED Notes (Signed)
Report given to ICU Rn Harvie Heck

## 2023-04-28 NOTE — Progress Notes (Signed)
       CROSS COVER NOTE  NAME: Ronnie Mann MRN: [id number] DOB : 09-Mar-1956    Concern as stated by nurse / staff   Anxiety restlessness     Pertinent findings on chart review:   Assessment and  Interventions   Assessment: Patient endorses feeling anxious and uncomfortable with laying in bed Plan: Trial dose of xanax - plan discussed with patient       Donnie Mesa NP [facility] Cross Cover 7pm-7am - check amion for availability Pager [phone]

## 2023-04-28 NOTE — Assessment & Plan Note (Addendum)
Cardiology to follow I suspect demand ischemia related to respiratory problems and are not planning  cardiac cath.

## 2023-04-28 NOTE — Assessment & Plan Note (Signed)
Likely quite chronic Urology consult Patient declined stent placement

## 2023-04-28 NOTE — ED Notes (Addendum)
Report given to Or RN Jeanella Flattery, This Rn Just notified that procedure cancelled for this patient, due to the need for cardiac clearance.  Procedure was not canceled due to cardiac clearance issues.  Patient refused ureteral stent, and symptoms are more consistent with cardiac etiology, suspect left ureteral stone is chronic.  Please see my note for details  Legrand Rams, MD 04/28/2023

## 2023-04-28 NOTE — ED Provider Notes (Signed)
[facility] Provider Note    Event Date/Time   First MD Initiated Contact with Patient 04/28/23 1127     (approximate)   History   Shortness of Breath and Chest Pain   HPI  Ronnie Mann is a 67 y.o. male   Past medical history of kidney stones who presents to the emergency department with left-sided chest pain shortness of breath radiating to left arm that started this morning.  He states that he has had cold-like symptoms mild cough and congestion for the last several days.  He had a similar occurrence of left-sided invasive ago that resolved and he was never worked up.  No history of VTE or CAD.  He denies any trauma.  He awoke with some swelling to his right ankle.       Physical Exam   Triage Vital Signs: ED Triage Vitals  Enc Vitals Group     BP 04/28/23 1113 (!) 161/90     Pulse Rate 04/28/23 1113 (!) 120     Resp 04/28/23 1113 (!) 22     Temp 04/28/23 1113 98.3 F (36.8 C)     Temp Source 04/28/23 1113 Oral     SpO2 04/28/23 1113 95 %     Weight 04/28/23 1112 210 lb (95.3 kg)     Height --      Head Circumference --      Peak Flow --      Pain Score 04/28/23 1112 9     Pain Loc --      Pain Edu? --      Excl. in GC? --     Most recent vital signs: Vitals:   04/28/23 1155 04/28/23 1200  BP:  (!) 154/92  Pulse: (!) 117 (!) 113  Resp: (!) 29 (!) 21  Temp:    SpO2: 94% 95%    General: Awake, appears to be in pain, breathing 20 times a minute CV:  Good peripheral perfusion.  Resp:  Tachypnea but clear lungs bilaterally without focality or wheezing Abd:  No distention.  Abdomen nontender Other:  Is significant for hypertension, tachycardia and tachypnea but no fever.  Clear lung sounds.  Pulses strong in the affected left upper extremity and equal compared to R.  Very mild swelling to the right ankle compared to left.  No significant peripheral edema swelling or pain in the lower extremities otherwise.  Oxygen saturation  reported by nurse to be 90% on room air placed on small amount of nasal cannula with correction of O2 saturation to 95.   ED Results / Procedures / Treatments   Labs (all labs ordered are listed, but only abnormal results are displayed) Labs Reviewed  BASIC METABOLIC PANEL - Abnormal; Notable for the following components:      Result Value   Glucose, Bld 293 (*)    BUN 30 (*)    All other components within normal limits  CBC - Abnormal; Notable for the following components:   WBC 14.4 (*)    RBC 3.98 (*)    Hemoglobin 12.8 (*)    All other components within normal limits  LACTIC ACID, PLASMA - Abnormal; Notable for the following components:   Lactic Acid, Venous 2.5 (*)    All other components within normal limits  BRAIN NATRIURETIC PEPTIDE - Abnormal; Notable for the following components:   B Natriuretic Peptide 530.9 (*)    All other components within normal limits  TROPONIN I (HIGH SENSITIVITY) - Abnormal;  Notable for the following components:   Troponin I (High Sensitivity) 54 (*)    All other components within normal limits  SARS CORONAVIRUS 2 BY RT PCR  CULTURE, BLOOD (ROUTINE X 2)  CULTURE, BLOOD (ROUTINE X 2)  RESPIRATORY PANEL BY PCR  HEPATIC FUNCTION PANEL  LIPASE, BLOOD  LACTIC ACID, PLASMA  TROPONIN I (HIGH SENSITIVITY)     I ordered and reviewed the above labs they are notable for lactic acidosis and WBC elevation, Trop 50s initially  EKG  ED ECG REPORT I, Pilar Jarvis, the attending physician, personally viewed and interpreted this ECG.   Date: 04/28/2023  EKG Time: 1115  Rate: 119  Rhythm: sinus tachycardia  Intervals: Bifascicular block, left anterior fascicular block, RBBB  ST&T Change: no stemi    RADIOLOGY I independently reviewed and interpreted x-rays to bilateral opacities concerning for pulmonary edema infection   PROCEDURES:  Critical Care performed: Yes, see critical care procedure note(s)  .Critical Care  Performed by: Pilar Jarvis, MD Authorized by: Pilar Jarvis, MD   Critical care provider statement:    Critical care time (minutes):  30   Critical care was time spent personally by me on the following activities:  Development of treatment plan with patient or surrogate, discussions with consultants, evaluation of patient's response to treatment, examination of patient, ordering and review of laboratory studies, ordering and review of radiographic studies, ordering and performing treatments and interventions, pulse oximetry, re-evaluation of patient's condition and review of old charts    MEDICATIONS ORDERED IN ED: Medications  azithromycin (ZITHROMAX) 500 mg in sodium chloride 0.9 % 250 mL IVPB (500 mg Intravenous New Bag/Given 04/28/23 1257)  morphine (PF) 4 MG/ML injection 4 mg (4 mg Intravenous Given 04/28/23 1149)  aspirin chewable tablet 324 mg (324 mg Oral Given 04/28/23 1147)  sodium chloride 0.9 % bolus 2,000 mL (2,000 mLs Intravenous Bolus from Bag 04/28/23 1152)  cefTRIAXone (ROCEPHIN) 1 g in sodium chloride 0.9 % 100 mL IVPB (1 g Intravenous New Bag/Given 04/28/23 1201)  iohexol (OMNIPAQUE) 350 MG/ML injection 75 mL (75 mLs Intravenous Contrast Given 04/28/23 1243)  morphine (PF) 4 MG/ML injection 4 mg (4 mg Intravenous Given 04/28/23 1321)    External physician / consultants:  I spoke with hospitalist Dr Shawnie Pons and Mariah Milling of cardiology regarding care plan for this patient.   IMPRESSION / MDM / ASSESSMENT AND PLAN / ED COURSE  I reviewed the triage vital signs and the nursing notes.                                Patient's presentation is most consistent with acute presentation with potential threat to life or bodily function.  Differential diagnosis includes, but is not limited to, ACS, PE, dissection, bacterial pneumonia, sepsis, pneumothorax, viral URI   The patient is on the cardiac monitor to evaluate for evidence of arrhythmia and/or significant heart rate changes.  MDM:    Chest pain shortness of  breath after viral URI symptoms of nasal congestion mild cough in the preceding days.  Concerned you have a bacterial pneumonia and meet sepsis criteria with tachycardia and increased respiratory, will obtain labs and blood cultures, initial IV fluid bolus will give 30 cc/kg ideal body weight, obtain chest x-ray.  Will give ceftriaxone and azithromycin for presumed respiratory infectious cause.   Also considered ACS with chest pain to L arm.  Patient shows bifascicular block.  No STEMI, will trend  troponins and give aspirin.  Consider PE  with shortness of breath, new oxygen requirement, check a CT angiogram of the chest.   ---  Patient continues to have pain in the left chest rating down the left arm, repeat EKG is nonischemic.  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  I wonder if this may be referred pain from renal colic, so I ordered a CT renal scan without contrast.  He continues to have pain and tachycardia now on 110's, did increase to the 150s upon arrival back to the room from CT, reviewed today's EKG is in clinical presentation with cardiology, does not meet STEMI criteria.  Will try to further rate control my first controlling his pain. Trop initial 50 may be driven by demand/tachycardia, will con't to trend.   I still have concern for sepsis given his viral illness preceding days leading up to today's presentation, w +WBC and lactic, but after 1 L fluids CXR resulted with pulmonary edema and his heart rate increased so I held further fluids for the time being for risk of fluid overload in case there was an element of pulmonary edema coexisting.   Hospital admission.       FINAL CLINICAL IMPRESSION(S) / ED DIAGNOSES   Final diagnoses:  Shortness of breath  Chest pain, unspecified type  Viral URI     Rx / DC Orders   ED  Discharge Orders     None        Note:  This document was prepared using Dragon voice recognition software and may include unintentional dictation errors.    Pilar Jarvis, MD 04/28/23 1340

## 2023-04-28 NOTE — H&P (Addendum)
History and Physical    Patient: Ronnie Mann:[id number] DOB: 1956-10-04 DOA: 04/28/2023 DOS: the patient was seen and examined on 04/28/2023 PCP: Pcp, No  Patient coming from: Home where he lives with his son  Chief Complaint:  Chief Complaint  Patient presents with   Shortness of Breath   Chest Pain   HPI: Ronnie Mann is a 67 y.o. male with medical history significant of  nephrolithiasis requiring multiple surgeries and lithotripsies in the past.  He reports right-sided kidney pain that has been ongoing and related to that for a long time.  He also reports URI congestion and runny nose for the last week prior to admission.  He reports new onset of substernal "vice like" substernal chest pain that radiated to his left arm.  He reports ongoing shortness of breath associated with this.  He also is awoken from sleep with this last night and had broken out into a cold sweat.  In the ED he was noted to have an elevated lactic acid and elevated WBC at 14.4.  He was given a liter of IV fluids.  Chest x-ray was done and showed pulmonary edema and his BNP said came back at 530.  He was not given further IV fluid boluses.  His initial troponin was 54 rose to 234.  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  Additionally patient was noted to have a CBG of 298 and did report polyuria and polydipsia at home. Review of Systems: As mentioned in the history of present illness. All other systems reviewed and are negative. Past Medical History:  Diagnosis Date   Kidney stones    No past surgical history on file. Social History:  reports that he has never smoked. He has never used smokeless tobacco. He reports that he does not drink alcohol  and does not use drugs.  No Known Allergies  No family history on file.  Prior to Admission medications   Not on File    Physical Exam: Vitals:   04/28/23 1430 04/28/23 1445 04/28/23 1455 04/28/23 1500  BP:   (!) 161/98 (!) 148/88  Pulse: (!) 124 (!) 118 (!) 123 (!) 122  Resp: (!) 24 (!) 21 (!) 28 (!) 21  Temp:      TempSrc:      SpO2: (!) 89% 92% 93% 95%  Weight:       Physical Examination: General appearance - in mild to moderate distress and ill-appearing Neck - supple, no significant adenopathy Chest - wheezing noted throughout Heart -tachycardic Back-right CVA tenderness Abdomen - soft, nontender, nondistended, no masses or organomegaly Extremities - no pedal edema noted Skin -petechiae noted about the ankles  Data Reviewed: Results for orders placed or performed during the hospital encounter of 04/28/23 (from the past 24 hour(s))  Basic metabolic panel     Status: Abnormal   Collection Time: 04/28/23 11:30 AM  Result Value Ref Range   Sodium 138 135 - 145 mmol/L   Potassium 4.1 3.5 - 5.1 mmol/L   Chloride 104 98 - 111 mmol/L   CO2 22 22 - 32 mmol/L   Glucose, Bld 293 (H) 70 - 99 mg/dL   BUN 30 (H) 8 - 23 mg/dL   Creatinine, Ser  1.21 0.61 - 1.24 mg/dL   Calcium 9.8 8.9 - 16.1 mg/dL   GFR, Estimated >09 >60 mL/min   Anion gap 12 5 - 15  CBC     Status: Abnormal   Collection Time: 04/28/23 11:30 AM  Result Value Ref Range   WBC 14.4 (H) 4.0 - 10.5 K/uL   RBC 3.98 (L) 4.22 - 5.81 MIL/uL   Hemoglobin 12.8 (L) 13.0 - 17.0 g/dL   HCT 45.4 09.8 - 11.9 %   MCV 100.0 80.0 - 100.0 fL   MCH 32.2 26.0 - 34.0 pg   MCHC 32.2 30.0 - 36.0 g/dL   RDW 14.7 82.9 - 56.2 %   Platelets 255 150 - 400 K/uL   nRBC 0.0 0.0 - 0.2 %  Troponin I (High Sensitivity)     Status: Abnormal   Collection Time: 04/28/23 11:30 AM  Result Value Ref Range   Troponin I (High Sensitivity) 54 (H) <18 ng/L  Hepatic function panel     Status: None   Collection Time: 04/28/23 11:30 AM  Result  Value Ref Range   Total Protein 7.7 6.5 - 8.1 g/dL   Albumin 4.2 3.5 - 5.0 g/dL   AST 32 15 - 41 U/L   ALT 22 0 - 44 U/L   Alkaline Phosphatase 101 38 - 126 U/L   Total Bilirubin 0.8 0.3 - 1.2 mg/dL   Bilirubin, Direct 0.1 0.0 - 0.2 mg/dL   Indirect Bilirubin 0.7 0.3 - 0.9 mg/dL  Lipase, blood     Status: None   Collection Time: 04/28/23 11:30 AM  Result Value Ref Range   Lipase 29 11 - 51 U/L  Lactic acid, plasma     Status: Abnormal   Collection Time: 04/28/23 11:30 AM  Result Value Ref Range   Lactic Acid, Venous 2.5 (HH) 0.5 - 1.9 mmol/L  Brain natriuretic peptide     Status: Abnormal   Collection Time: 04/28/23 11:30 AM  Result Value Ref Range   B Natriuretic Peptide 530.9 (H) 0.0 - 100.0 pg/mL  TSH     Status: None   Collection Time: 04/28/23 11:30 AM  Result Value Ref Range   TSH 4.483 0.350 - 4.500 uIU/mL  SARS Coronavirus 2 by RT PCR (hospital order, performed in [facility] hospital lab) *cepheid single result test* Anterior Nasal Swab     Status: None   Collection Time: 04/28/23 12:09 PM   Specimen: Anterior Nasal Swab  Result Value Ref Range   SARS Coronavirus 2 by RT PCR NEGATIVE NEGATIVE  Respiratory (~20 pathogens) panel by PCR     Status: None   Collection Time: 04/28/23  1:24 PM   Specimen: Nasopharyngeal Swab; Respiratory  Result Value Ref Range   Adenovirus NOT DETECTED NOT DETECTED   Coronavirus 229E NOT DETECTED NOT DETECTED   Coronavirus HKU1 NOT DETECTED NOT DETECTED   Coronavirus NL63 NOT DETECTED NOT DETECTED   Coronavirus OC43 NOT DETECTED NOT DETECTED   Metapneumovirus NOT DETECTED NOT DETECTED   Rhinovirus / Enterovirus NOT DETECTED NOT DETECTED   Influenza A NOT DETECTED NOT DETECTED   Influenza B NOT DETECTED NOT DETECTED   Parainfluenza Virus 1 NOT DETECTED NOT DETECTED   Parainfluenza Virus 2 NOT DETECTED NOT DETECTED   Parainfluenza Virus 3 NOT DETECTED NOT DETECTED   Parainfluenza Virus 4 NOT DETECTED NOT DETECTED   Respiratory  Syncytial Virus NOT DETECTED NOT DETECTED   Bordetella pertussis NOT DETECTED NOT DETECTED   Bordetella Parapertussis NOT DETECTED NOT DETECTED  Chlamydophila pneumoniae NOT DETECTED NOT DETECTED   Mycoplasma pneumoniae NOT DETECTED NOT DETECTED  Lactic acid, plasma     Status: Abnormal   Collection Time: 04/28/23  1:47 PM  Result Value Ref Range   Lactic Acid, Venous 2.6 (HH) 0.5 - 1.9 mmol/L  Troponin I (High Sensitivity)     Status: Abnormal   Collection Time: 04/28/23  1:47 PM  Result Value Ref Range   Troponin I (High Sensitivity) 234 (HH) <18 ng/L  Glucose, capillary     Status: Abnormal   Collection Time: 04/28/23  4:59 PM  Result Value Ref Range   Glucose-Capillary 134 (H) 70 - 99 mg/dL  MRSA Next Gen by PCR, Nasal     Status: None   Collection Time: 04/28/23  5:07 PM   Specimen: Nasal Mucosa; Nasal Swab  Result Value Ref Range   MRSA by PCR Next Gen NOT DETECTED NOT DETECTED   CT Renal Stone Study  Result Date: 04/28/2023 CLINICAL DATA:  CTA of the chest performed for left-sided chest pain demonstrated evidence of a staghorn calculus in the visualized upper right kidney and evidence of probable significant hydronephrosis of the visualized upper left kidney. EXAM: CT ABDOMEN AND PELVIS WITHOUT CONTRAST TECHNIQUE: Multidetector CT imaging of the abdomen and pelvis was performed following the standard protocol without IV contrast. RADIATION DOSE REDUCTION: This exam was performed according to the departmental dose-optimization program which includes automated exposure control, adjustment of the mA and/or kV according to patient size and/or use of iterative reconstruction technique. COMPARISON:  CT a of the chest earlier today. Prior CT of the abdomen and pelvis without contrast on 07/23/2011. FINDINGS: Lower chest: Scattered scarring and atelectasis at both lung bases. Large hiatal hernia. Hepatobiliary: The liver does not appear overtly cirrhotic by abdominal CT. At least 1 gallstone  is identified in the gallbladder lumen. Gallbladder is decompressed with potentially some fundal wall thickening which may implicate some chronic inflammation. No biliary ductal dilatation. Pancreas: Unremarkable. No pancreatic ductal dilatation or surrounding inflammatory changes. Spleen: Normal in size without focal abnormality. Adrenals/Urinary Tract: Evaluation of the kidneys is somewhat challenging due to the administration of IV contrast for CTA of the chest over 1 hour prior to the abdominal and pelvic CT. There is evidence of a staghorn calculus within the upper pole collecting system of the right kidney as well as an additional calculus in the lower pole. Some excretion of contrast is present surrounding calculi as well as in the interpolar kidney and contrast is also seen in the right ureter. The right renal pelvis appears thickened and stenotic. This also appeared thickened in 2012 and may reflect chronic inflammation. Ultimately correlation with ureteroscopy may be beneficial in evaluation for potential underlying urothelial neoplasm. The left kidney demonstrates significant hydronephrosis with some partial excretion of contrast in the lower pole, a partial staghorn calculus in the lower pole measuring up to approximately 2.3 cm in greatest diameter and under obstructing calculus in the ureter at the juncture of proximal and mid thirds measuring 16 mm in height and 8 x 8 mm in transverse dimensions. Cortical thinning of the left kidney implicates a chronic obstruction. The bladder is filled with excreted contrast and is unremarkable in appearance. Stomach/Bowel: Bowel shows no evidence of obstruction, ileus, inflammation or lesion. The appendix is normal. No free intraperitoneal air. Vascular/Lymphatic: Atherosclerosis of the abdominal aorta without evidence of aneurysm. No lymphadenopathy identified. Reproductive: Prostate is unremarkable. Other: Small left inguinal hernia containing fat. No  abdominopelvic ascites. Musculoskeletal:  No acute or significant osseous findings. IMPRESSION: 1. 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 Partial staghorn calculus of the lower pole of the left kidney. 2. There is evidence of significant hydronephrosis of the left kidney with cortical thinning and an obstructing calculus in the ureter at the juncture of proximal and mid thirds measuring 16 mm in height and 8 x 8 mm in transverse dimensions. Cortical thinning implicates a chronic obstruction. 3. The right renal pelvis appears thickened and stenotic. This also appeared thickened in 2012 and may reflect chronic inflammation. Ultimately correlation with ureteroscopy may be beneficial in evaluation for potential underlying urothelial neoplasm. 4. Cholelithiasis with decompressed gallbladder and potentially some fundal wall thickening which may implicate some chronic inflammation. 5. Large hiatal hernia. 6. Small left inguinal hernia containing fat. 7. Aortic atherosclerosis. Aortic Atherosclerosis (ICD10-I70.0). Electronically Signed   By: Irish Lack M.D.   On: 04/28/2023 14:54   CT Angio Chest PE W/Cm &/Or Wo Cm  Result Date: 04/28/2023 CLINICAL DATA:  Clinical concern for pulmonary embolism. EXAM: CT ANGIOGRAPHY CHEST WITH CONTRAST TECHNIQUE: Multidetector CT imaging of the chest was performed using the standard protocol during bolus administration of intravenous contrast. Multiplanar CT image reconstructions and MIPs were obtained to evaluate the vascular anatomy. RADIATION DOSE REDUCTION: This exam was performed according to the departmental dose-optimization program which includes automated exposure control, adjustment of the mA and/or kV according to patient size and/or use of iterative reconstruction technique. CONTRAST:  75mL OMNIPAQUE IOHEXOL 350 MG/ML SOLN COMPARISON:  None Available. FINDINGS:  Cardiovascular: Heart is enlarged. No substantial pericardial effusion. Ascending thoracic aorta measures up to 4.2 cm diameter. Enlargement of the pulmonary outflow tract/main pulmonary arteries suggests pulmonary arterial hypertension. There is no filling defect within the opacified pulmonary arteries to suggest the presence of an acute pulmonary embolus. Mediastinum/Nodes: Scattered upper normal mediastinal lymph nodes identified with 14 mm short axis precarinal node on 52/4. There is no hilar lymphadenopathy. Large hiatal hernia. The esophagus has normal imaging features. There is no axillary lymphadenopathy. Lungs/Pleura: Diffuse interlobular septal thickening evident with scattered areas of peripheral in central nodular/patchy ground-glass opacity. 19 mm ground-glass nodule identified superior segment right lower lobe on 65/5. No dense focal airspace consolidation. Trace bilateral pleural effusions. Upper Abdomen: Nodular liver contour raises concern for cirrhosis. 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 Left kidney incompletely visualized but demonstrates hydronephrosis with overlying cortical thinning. Exophytic lesion upper pole right kidney measures 2.8 cm in approaches water density but has been incompletely visualized. 19 mm left adrenal adenoma. No followup imaging is recommended. Musculoskeletal: No worrisome lytic or sclerotic osseous abnormality. Review of the MIP images confirms the above findings. IMPRESSION: 1. No CT evidence for acute pulmonary embolus. 2. Diffuse interlobular septal thickening with scattered areas of peripheral and central nodular/patchy ground-glass opacity. Imaging features are most suggestive of pulmonary edema. 3. 19 mm ground-glass nodule superior segment right lower lobe. Potentially related to the edema. Non-contrast chest CT at 3-6 months is recommended. If the ground glass opacities persist, subsequent  management will be based upon the most suspicious findings. This recommendation follows the consensus statement: Guidelines for Management of Incidental Pulmonary Nodules Detected on CT Images: From the Fleischner Society 2017; Radiology 2017; 281: 228-243. 4. Trace bilateral pleural effusions. 5. Nodular liver contour raises concern for cirrhosis. 6. Very large staghorn calculus in  the upper pole right kidney fills the upper pole collecting system. Left kidney incompletely visualized but demonstrates hydronephrosis with overlying cortical thinning. Urology consultation recommended. 7. 19 mm left adrenal adenoma. No followup imaging is recommended. Electronically Signed   By: Kennith Center M.D.   On: 04/28/2023 13:19   DG Chest 1 View  Result Date: 04/28/2023 CLINICAL DATA:  Shortness of breath and chest pain. EXAM: CHEST  1 VIEW COMPARISON:  06/01/2009 FINDINGS: Stable top-normal heart size and visible large hiatal hernia. Diffuse pulmonary edema most likely reflects congestive heart failure. No significant pleural fluid identified at by frontal chest radiograph. No pneumothorax or focal airspace consolidation. IMPRESSION: Diffuse pulmonary edema most likely reflecting congestive heart failure. Large hiatal hernia. Electronically Signed   By: Irish Lack M.D.   On: 04/28/2023 12:16    EKG shows sinus tach, RBBB, bifascicular block  Assessment and Plan: * CHF (congestive heart failure) (HCC) New onset with elevated BNP and pulm edema on chest x-ray Cardiology consult Check echo Holding diuresis due to elevated lactic acid but will likely need  Viral URI Per the patient had ongoing URI congestion, runny nose the week prior to admission.  Shortness of breath Oxygen requirement questionably related to pulmonary edema versus community-acquired pneumonia versus COPD versus all of the above IV Rocephin and azithromycin Respiratory cultures pending O2 as needed Albuterol as  needed  Cholelithiasis Seen on to CT scan Unclear if this is causing any of his symptoms currently.  Essential hypertension Begin metoprolol for rate control as well as BP control  Uncontrolled type 2 diabetes mellitus with hyperglycemia, without long-term current use of insulin (HCC) New onset with random CBG of 298 today.  He does report polyuria and polydipsia at home Diabetes education Sliding scale insulin Can consider starting metformin for him in a few days  Wheezing Patient quit smoking a while ago but does have wheezing. Albuterol as needed  Hydronephrosis of left kidney Again he could have an obstructive uropathy with infection behind that.  He declined stent placement today with urology Continue to monitor  AKI (acute kidney injury) (HCC) Versus chronic, he does not have enough care to say specifically.  CT reveals chronic renal changes in the left kidney as well as bilateral staghorn calculi.  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  Chest pain-resolved as of 04/28/2023 Substernal and concerning for ACS Cardiology following      Advance Care Planning:   Code Status: Not on file full  Consults: Cardiology, urology  Family Communication: Patient at bedside  Severity of Illness: The appropriate patient status for this patient is INPATIENT. Inpatient status is judged to be reasonable and necessary in order to provide the required intensity of service to ensure the patient's safety. The patient's presenting  symptoms, physical exam findings, and initial radiographic and laboratory data in the context of their chronic comorbidities is felt to place them at high risk for further clinical deterioration. Furthermore, it is not anticipated that the patient will be medically stable for discharge from the hospital within 2 midnights of admission.   * I certify that at the point of admission it  is my clinical judgment that the patient will require inpatient hospital care spanning beyond 2 midnights from the point of admission due to high intensity of service, high risk for further deterioration and high frequency of surveillance required.*  Author: Reva Bores, MD 04/28/2023 3:12 PM  For on call review [email or web address].

## 2023-04-28 NOTE — Assessment & Plan Note (Signed)
Substernal and concerning for ACS Cardiology following

## 2023-04-28 NOTE — Consult Note (Signed)
Urology Consult   I have been asked to see the patient by Dr. Shawnie Pons, for evaluation and management of left proximal ureteral stone, right staghorn stone.  Chief Complaint: Left shoulder and left arm pain  HPI:  Ronnie Mann is a 67 y.o. male with reported extensive prior nephrolithiasis who presents with 1 day of worsening left shoulder and left arm pain and pressure, as well as shortness of breath.  He reports he has had numerous stone surgeries before, including multiple open surgeries on the right side, he was previously followed by Dr. Evelene Croon, and those records are not available to me.  He really denies any left-sided flank pain.  He has chronic right-sided flank pain, he endorses ongoing left arm and left shoulder pain and pressure, as well as shortness of breath.  Chest CT was performed to evaluate for possible PE, and showed pulmonary edema but no evidence of PE, incidental finding of large staghorn calculus in the upper right pole with no right-sided hydronephrosis, and atrophic left kidney with left hydronephrosis.  This prompted a CT stone protocol, which showed right upper pole staghorn stone with no hydronephrosis, contrast drained from the right kidney, left 1.5 cm proximal ureteral stone with upstream hydronephrosis and left renal atrophy/cortical thinning likely indicating a chronic process for the left proximal ureteral stone.  Urinalysis has not been sent.  He reports he has had some cloudy urine but denies any dysuria or urgency/frequency.   Labs notable for elevated troponin of 234, lactate 2.6, normal renal function with creatinine 1.2, EGFR greater than 60, mild leukocytosis of 14k.  PMH: Past Medical History:  Diagnosis Date   Kidney stones     Allergies: No Known Allergies  Social History:  reports that he has never smoked. He has never used smokeless tobacco. He reports that he does not drink alcohol and does not use drugs.  Physical Exam: BP 137/81   Pulse  (!) 101   Temp 97.7 F (36.5 C) (Oral)   Resp 19   Wt 95.3 kg   SpO2 92%   BMI 31.01 kg/m    Constitutional: Conversational, clutching his left shoulder and arm Cardiovascular: Tachycardic GI: Abdomen is soft, nontender, nondistended, no abdominal masses GU: Large well-healed scar on right flank   Laboratory Data: Reviewed in epic, see HPI  Pertinent Imaging: I have personally reviewed the CT abdomen and pelvis with contrast from prior CT chest PE protocol showing right kidney with right upper pole partial staghorn stone, no right hydronephrosis, contrast drains from the right kidney, atrophic left kidney with cortical thinning and likely chronic 1.5 cm left proximal ureteral stone with mild hydronephrosis, large left lower pole partial staghorn stone.  Assessment & Plan:   Complex 67 year old male who presents with 1 day of worsening chest pain, left shoulder and arm pain, and shortness of breath.  Troponin rising, most recently 234.  CT scan of the abdomen and pelvis shows likely incidental finding of a right upper pole partial staghorn kidney stone, no right-sided hydronephrosis, 1.5 cm left proximal ureteral stone with mild left hydronephrosis, left renal atrophy and cortical thinning, left lower pole partial staghorn stone.  Suspect this proximal left ureteral stone is chronic in nature, and his clinical symptoms are not consistent with renal colic.  Urinalysis has not been sent.  I had a long conversation with the patient about possible etiologies of his symptoms from cardiac to renal colic to infectious with pyelonephritis.  We reviewed the imaging findings.  We discussed options including observation with further cardiac workup and consideration of outpatient management of his significant bilateral renal stone burden that is likely chronic, I also offered him left ureteral stent placement tonight  with the possibility that some of his left-sided pain could be from renal colic, or he  could potentially have upstream infection in the left kidney that would warrant drainage.  He adamantly refused ureteral stent placement, and apparently has had severe pain from ureteral stents previously that was worse than his kidney stone events.  Risks and benefits discussed extensively.  We also discussed alternatives like a left-sided nephrostomy tube for drainage and he also deferred.  Okay for anticoagulation from a urology perspective, if fevers or infected urine concerning for pyelonephritis, will re-offer left ureteral stent or left-sided nephrostomy tube, would not need to hold anticoagulation for ureteral stent placement if patient ultimately opts for intervention.  Recommendations:  -Appreciate hospitalist and cardiology involvement and recommendations -Okay for anticoagulation from a urology perspective -Suspect left proximal ureteral stone is chronic in nature based on the cortical thinning/renal atrophy, and absence of left flank or groin pain.  Renal function is normal.  Staghorn stones are not typically symptomatic in the absence of hydronephrosis, and his right-sided flank pain is chronic in nature -Urinalysis pending, patient refused left ureteral stent this evening in the setting of questionable infection with elevated lactate and leukocytosis  Sondra Come, MD  Total time spent on the floor was 80 minutes, with greater than 50% spent in counseling and coordination of care with the patient regarding chest pain, left shoulder pain, extensive nephrolithiasis found on CT, possible infection, risks and benefits of ureteral stent or nephrostomy tube, and coordination with hospitalist and cardiology team.  [facility] [address], [address] [place], [place] [address] [phone]

## 2023-04-28 NOTE — Consult Note (Signed)
Cardiology Consultation   Patient ID: TEAK BAKA MRN: [id number]; DOB: [birth year]  Admit date: 04/28/2023 Date of Consult: 04/28/2023  PCP:  Oneita Hurt No   [facility] Providers Cardiologist:  None      New consult done by Dr Mariah Milling  Patient Profile:   Ronnie Mann is a 67 y.o. male with a hx of kidney stones who is being seen 04/28/2023 for the evaluation of chest pain and shortness of breath at the request of Dr. Modesto Charon.  History of Present Illness:   Mr. Reddish presented to the [facility] emergency department today with complaints of shortness of breath and chest pain that started yesterday and has waxed and waned.  He stated the pain came back would not ease off today was to get his visit to the emergency department.  He states the pain was radiating down to his left arm with associated symptoms of nausea and shortness of breath.  He also endorsed that he had cold-like symptoms with mild cough and congestion for the last several months. A similar occurrence of left-sided chest pain sometime ago that resolved but he never had workup.  He also noted some swelling to his right ankle upon waking this morning.  He has had multiple surgeries related to his extensive history of kidney stones.  Initial vital signs: Blood pressure 161/90, pulse of 120, respirations of 22, temperature 98.3  Pertinent labs: Blood glucose of 293, BUN of 30, WBCs of 14.4, hemoglobin of 12.8, lactic acid of 2.5, BNP of 530.9, high-sensitivity troponin of 54, respiratory panel negative for COVID, extended respiratory panel still pending  Imaging: Chest x-ray revealed diffuse pulmonary edema most likely reflecting congestive heart failure, and a large hiatal hernia; CTA of the chest no evidence for acute pulmonary embolus, diffuse interlobar septal thickening with scattered areas of peripheral and central nodular/patchy groundglass opacity, imaging most suggestive of pulmonary edema, 19 mm groundglass nodule superior  segment to the right lower pole potentially related to edema, trace bilateral pleural effusions, nodular liver contour raises concern for cirrhosis, 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   Past Medical History:  Diagnosis Date   Kidney stones     No past surgical history on file.   Home Medications:  Prior to Admission medications   Not on File    Inpatient Medications: Scheduled Meds:  Continuous Infusions:  PRN Meds:   Allergies:   No Known Allergies  Social History:   Social History   Socioeconomic History   Marital status: Single    Spouse name: Not on file   Number of children: Not on file   Years of education: Not on file   Highest education level: Not on file  Occupational History   Not on file  Tobacco Use   Smoking status: Never   Smokeless tobacco: Never  Substance and Sexual Activity   Alcohol use: Never   Drug use: Never   Sexual activity: Not on file  Other Topics Concern   Not on file  Social History Narrative   Not on  file   Social Determinants of Health   Financial Resource Strain: Not on file  Food Insecurity: Not on file  Transportation Needs: Not on file  Physical Activity: Not on file  Stress: Not on file  Social Connections: Not on file  Intimate Partner Violence: Not on file    Family History:   No family history on file.   ROS:  Please see the history of present illness.  Review of Systems  Constitutional:  Positive for fever and  malaise/fatigue.  HENT:  Positive for congestion.   Respiratory:  Positive for cough and shortness of breath.   Cardiovascular:  Positive for chest pain and leg swelling.  Gastrointestinal:  Positive for nausea.  Genitourinary:  Positive for flank pain.  Neurological:  Positive for weakness.    All other ROS reviewed and negative.     Physical Exam/Data:   Vitals:   04/28/23 1300 04/28/23 1315 04/28/23 1330 04/28/23 1345  BP: (!) 175/101  (!) 148/109   Pulse: (!) 137 (!) 135 (!) 129 (!) 131  Resp: (!) 38 11    Temp:      TempSrc:      SpO2: 91% 92% (!) 89% 92%  Weight:       No intake or output data in the 24 hours ending 04/28/23 1424    04/28/2023   11:12 AM 07/01/2020    4:02 PM  Last 3 Weights  Weight (lbs) 210 lb 230 lb  Weight (kg) 95.255 kg 104.327 kg     Body mass index is 31.01 kg/m.  General:  Well nourished, well developed, ill-appearing, visibly uncomfortable HEENT: normal Neck: no JVD Vascular: No carotid bruits; Distal pulses 2+ bilaterally Cardiac:  normal S1, S2; RRR; tachycardic, no murmur  Lungs:  coarse with expiratory wheezing to auscultation bilaterally, respirations are unlabored on 3L of O2 via [place] Abd: soft, nontender, no hepatomegaly  Ext: no edema Musculoskeletal:  No deformities, BUE and BLE strength normal and equal Skin: warm and dry, scabbed areas noted to the bilateral ankles resembling a contact dermatitis versus possible bug bites Neuro:  CNs 2-12 intact, no focal abnormalities noted Psych:  Normal affect   EKG:  The EKG was personally reviewed and demonstrates: Sinus tach rate of 119, right bundle branch block, left anterior fascicular block, LVH, possible septal infarct Telemetry:  Telemetry was personally reviewed and demonstrates:  sinus tachycardia 124  Relevant CV Studies: Echocardiogram is ordered and pending  Laboratory Data:  High Sensitivity Troponin:   Recent Labs  Lab 04/28/23 1130  TROPONINIHS 54*      Chemistry Recent Labs  Lab 04/28/23 1130  NA 138  K 4.1  CL 104  CO2 22  GLUCOSE 293*  BUN 30*  CREATININE 1.21  CALCIUM 9.8  GFRNONAA >60  ANIONGAP 12    Recent Labs  Lab 04/28/23 1130  PROT 7.7  ALBUMIN 4.2  AST 32  ALT 22  ALKPHOS 101  BILITOT 0.8   Lipids No results for input(s): "CHOL", "TRIG", "HDL", "LABVLDL", "LDLCALC", "CHOLHDL" in the last 168 hours.  Hematology Recent Labs  Lab 04/28/23 1130  WBC 14.4*  RBC 3.98*  HGB 12.8*  HCT 39.8  MCV 100.0  MCH 32.2  MCHC 32.2  RDW 13.3  PLT 255   Thyroid No results for input(s): "TSH", "FREET4" in the last 168 hours.  BNP Recent Labs  Lab 04/28/23 1130  BNP 530.9*    DDimer No results for input(s): "DDIMER" in the last 168 hours.  Radiology/Studies:  CT Angio Chest PE W/Cm &/Or Wo Cm  Result Date: 04/28/2023 CLINICAL DATA:  Clinical concern for pulmonary embolism. EXAM: CT ANGIOGRAPHY CHEST WITH CONTRAST TECHNIQUE: Multidetector CT imaging of the chest was performed using the standard protocol during bolus administration of intravenous contrast. Multiplanar CT image reconstructions and MIPs were obtained to evaluate the vascular anatomy. RADIATION DOSE REDUCTION: This exam was performed according to the departmental dose-optimization program which includes automated exposure control, adjustment of the mA and/or kV according to patient size and/or use of iterative reconstruction technique. CONTRAST:  75mL OMNIPAQUE IOHEXOL 350 MG/ML SOLN COMPARISON:  None Available. FINDINGS: Cardiovascular: Heart is enlarged. No substantial pericardial effusion. Ascending thoracic aorta measures up to 4.2 cm diameter. Enlargement of the pulmonary outflow tract/main pulmonary arteries suggests pulmonary arterial hypertension. There is no filling defect within the opacified pulmonary arteries to suggest the presence of an acute pulmonary embolus. Mediastinum/Nodes: Scattered upper normal mediastinal lymph nodes identified with 14  mm short axis precarinal node on 52/4. There is no hilar lymphadenopathy. Large hiatal hernia. The esophagus has normal imaging features. There is no axillary lymphadenopathy. Lungs/Pleura: Diffuse interlobular septal thickening evident with scattered areas of peripheral in central nodular/patchy ground-glass opacity. 19 mm ground-glass nodule identified superior segment right lower lobe on 65/5. No dense focal airspace consolidation. Trace bilateral pleural effusions. Upper Abdomen: Nodular liver contour raises concern for cirrhosis. 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 Left kidney incompletely visualized but demonstrates hydronephrosis with overlying cortical thinning. Exophytic lesion upper pole right kidney measures 2.8 cm in approaches water density but has been incompletely visualized. 19 mm left adrenal adenoma. No followup imaging is recommended. Musculoskeletal: No worrisome lytic or sclerotic osseous abnormality. Review of the MIP images confirms the above findings. IMPRESSION: 1. No CT evidence for acute pulmonary embolus. 2. Diffuse interlobular septal thickening with scattered areas of peripheral and central nodular/patchy ground-glass opacity. Imaging features are most suggestive of pulmonary edema. 3. 19 mm ground-glass nodule superior segment right lower lobe. Potentially related to the edema. Non-contrast chest CT at 3-6 months is recommended. If the ground glass opacities persist, subsequent management will be based upon the most suspicious findings. This recommendation follows the consensus statement: Guidelines for Management of Incidental Pulmonary Nodules Detected on CT Images: From the Fleischner Society 2017; Radiology 2017; 281: 228-243. 4. Trace bilateral pleural effusions. 5. Nodular liver contour raises concern for cirrhosis. 6. 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  Left kidney incompletely visualized but demonstrates hydronephrosis with overlying cortical thinning. Urology consultation recommended. 7. 19 mm left adrenal adenoma. No followup imaging is recommended. Electronically Signed   By: Kennith Center M.D.   On: 04/28/2023 13:19   DG Chest 1 View  Result Date: 04/28/2023 CLINICAL DATA:  Shortness of breath and chest pain. EXAM: CHEST  1 VIEW COMPARISON:  06/01/2009 FINDINGS: Stable top-normal heart size and visible large hiatal hernia. Diffuse pulmonary edema most likely reflects congestive heart failure. No significant pleural fluid identified at by frontal chest radiograph. No pneumothorax or focal airspace consolidation. IMPRESSION: Diffuse pulmonary edema most likely reflecting congestive heart failure. Large hiatal hernia. Electronically Signed   By: Irish Lack M.D.   On: 04/28/2023 12:16     Assessment and Plan:   Chest pain with elevated high-sensitivity troponin -Patient presented with chest/abdominal/and right-sided discomfort with radiation down the left arm -Likely secondary to demand ischemia from pain,  shortness of breath, viral upper respiratory infection, and sinus tachycardia -Denies any prior history of coronary artery disease -High-sensitivity troponin resulted at 54, continue to trend -EKG with sinus tachycardia and bifascicular block with questionable septal infarct -Continue with aspirin 81 mg daily -Will hold off on starting heparin infusion for now with large urologic problems going on with patient -EKG as needed for pain or changes  Shortness of breath with elevated BNP -Patient states he has been having shortness of breath for the last several months likely related to a viral upper respiratory infection -He did receive 2 L of IV fluid -Maintaining oxygen saturation on 2 L of O2 -BNP 530.9 -Echocardiogram ordered and pending further recommendations to follow  Elevated blood pressures without the diagnoses of  hypertension -Blood pressures 130s to 170s over 80s to 100 -Patient has never been diagnosed with hypertension -Patient states he takes no medications at home -Could be a combination of pain, shortness of breath, and upper respiratory symptoms -Started on metoprolol 25 mg every 6 hours for better rate control -Vital signs per unit protocol  Sinus tachycardia with bifascicular block -EKG reveals sinus tachycardia as well as telemetry in the room 100-120s -Started on metoprolol to tartrate 25 mg every 6 hours -Continue with telemetry monitoring -Likely component of shortness of breath, chest discomfort, viral upper respiratory infection -Will continue to monitor  Kidney stones bilaterally with left kidney hydronephrosis -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 and evidence of probable significant hydronephrosis of the upper left kidney via CT angio of the chest -Renal CT reveals bilateral nephrolithiasis with staghorn calculus and bilateral kidneys -Confirmation of significant hydronephrosis of the left kidney with an obstructing calculus in the ureter at the junction of the proximal and mid thirds measuring 16 mm in height and 8 by millimeters in transverse dimensions -Urology consultation recommended by radiology after scans -Monitor urine output -Continued management per urology and IM   Risk Assessment/Risk Scores:     TIMI Risk Score for Unstable Angina or Non-ST Elevation MI:   The patient's TIMI risk score is 5, which indicates a 26% risk of all cause mortality, new or recurrent myocardial infarction or need for urgent revascularization in the next 14 days.  New York Heart Association (NYHA) Functional Class NYHA Class II        For questions or updates, please contact [facility] Please consult [email or web address] for contact info under    Signed, Leroy Trim, NP  04/28/2023 2:24 PM

## 2023-04-28 NOTE — Assessment & Plan Note (Signed)
Again he could have an obstructive uropathy with infection behind that.  He declined stent placement today with urology Continue to monitor

## 2023-04-28 NOTE — Hospital Course (Signed)
67 year old with past medical history significant for nephrolithiasis requiring multiple surgeries and lithotripsies in the past.  He reports right-sided kidney pain that has been ongoing and related to that for a long time.  He also reports URI congestion and runny nose for the last week prior to admission.  He reports new onset of substernal "vice like" substernal chest pain that radiated to his left arm.  He reports ongoing shortness of breath associated with this.  He also is awoken from sleep with this last night and had broken out into a cold sweat.  In the ED he was noted to have an elevated lactic acid and elevated WBC at 14.4.  He was given a liter of IV fluids.  Chest x-ray was done and showed pulmonary edema and his BNP said came back at 530.  He was not given further IV fluid boluses.  His initial troponin was 54 rose to 234.  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  Additionally patient was noted to have a CBG of 298 and did report polyuria and polydipsia at home.

## 2023-04-28 NOTE — Assessment & Plan Note (Addendum)
New onset with elevated BNP and pulm edema on chest x-ray Cardiology consult Check echo Holding diuresis due to elevated lactic acid but will likely need

## 2023-04-28 NOTE — ED Triage Notes (Signed)
Pt presents to the ED due to SOB and CP that started yesterday and eased off. Pt states the pain came back and would not ease off. Pt states the pain is radiating down his L arm. Pt also c/o nausea. Pt A&Ox4

## 2023-04-28 NOTE — Assessment & Plan Note (Signed)
Patient quit smoking a while ago but does have wheezing. Albuterol as needed

## 2023-04-28 NOTE — ED Notes (Signed)
Care Update given to patients son.

## 2023-04-28 NOTE — Assessment & Plan Note (Signed)
New onset with random CBG of 298 today.  He does report polyuria and polydipsia at home Diabetes education Sliding scale insulin Can consider starting metformin for him in a few days

## 2023-04-28 NOTE — Assessment & Plan Note (Signed)
Seen on to CT scan Unclear if this is causing any of his symptoms currently.

## 2023-04-28 NOTE — Assessment & Plan Note (Addendum)
Question obstructive uropathy with possible sepsis related to this versus community-acquired pneumonia IV antibiotics to cover both Cultures pending

## 2023-04-28 NOTE — Assessment & Plan Note (Signed)
Versus chronic, he does not have enough care to say specifically.  CT reveals chronic renal changes in the left kidney as well as bilateral staghorn calculi.

## 2023-04-28 NOTE — Assessment & Plan Note (Addendum)
Question [facility] to follow Beta-blocker Heparin GTT per cardiology

## 2023-04-29 DIAGNOSIS — N135 Crossing vessel and stricture of ureter without hydronephrosis: Secondary | ICD-10-CM

## 2023-04-29 DIAGNOSIS — I509 Heart failure, unspecified: Secondary | ICD-10-CM | POA: Diagnosis not present

## 2023-04-29 DIAGNOSIS — N390 Urinary tract infection, site not specified: Secondary | ICD-10-CM

## 2023-04-29 DIAGNOSIS — N200 Calculus of kidney: Secondary | ICD-10-CM

## 2023-04-29 DIAGNOSIS — I214 Non-ST elevation (NSTEMI) myocardial infarction: Secondary | ICD-10-CM

## 2023-04-29 DIAGNOSIS — E1165 Type 2 diabetes mellitus with hyperglycemia: Secondary | ICD-10-CM

## 2023-04-29 LAB — APTT: aPTT: 51 seconds — ABNORMAL HIGH (ref 24–36)

## 2023-04-29 LAB — HEPARIN LEVEL (UNFRACTIONATED)
Heparin Unfractionated: 0.11 IU/mL — ABNORMAL LOW (ref 0.30–0.70)
Heparin Unfractionated: 0.16 IU/mL — ABNORMAL LOW (ref 0.30–0.70)
Heparin Unfractionated: 0.31 IU/mL (ref 0.30–0.70)

## 2023-04-29 LAB — COMPREHENSIVE METABOLIC PANEL
ALT: 30 U/L (ref 0–44)
AST: 122 U/L — ABNORMAL HIGH (ref 15–41)
Albumin: 4.1 g/dL (ref 3.5–5.0)
Alkaline Phosphatase: 93 U/L (ref 38–126)
Anion gap: 9 (ref 5–15)
BUN: 27 mg/dL — ABNORMAL HIGH (ref 8–23)
CO2: 22 mmol/L (ref 22–32)
Calcium: 8.8 mg/dL — ABNORMAL LOW (ref 8.9–10.3)
Chloride: 108 mmol/L (ref 98–111)
Creatinine, Ser: 0.93 mg/dL (ref 0.61–1.24)
GFR, Estimated: >60 mL/min (ref >60)
Glucose, Bld: 146 mg/dL — ABNORMAL HIGH (ref 70–99)
Potassium: 4.4 mmol/L (ref 3.5–5.1)
Sodium: 139 mmol/L (ref 135–145)
Total Bilirubin: 1.1 mg/dL (ref 0.3–1.2)
Total Protein: 7.1 g/dL (ref 6.5–8.1)

## 2023-04-29 LAB — PROTIME-INR
INR: 1.1 (ref 0.8–1.2)
Prothrombin Time: 14.8 seconds (ref 11.4–15.2)

## 2023-04-29 LAB — CULTURE, BLOOD (ROUTINE X 2): Culture: NO GROWTH

## 2023-04-29 LAB — GLUCOSE, CAPILLARY
Glucose-Capillary: 106 mg/dL — ABNORMAL HIGH (ref 70–99)
Glucose-Capillary: 118 mg/dL — ABNORMAL HIGH (ref 70–99)
Glucose-Capillary: 124 mg/dL — ABNORMAL HIGH (ref 70–99)
Glucose-Capillary: 129 mg/dL — ABNORMAL HIGH (ref 70–99)
Glucose-Capillary: 144 mg/dL — ABNORMAL HIGH (ref 70–99)
Glucose-Capillary: 145 mg/dL — ABNORMAL HIGH (ref 70–99)

## 2023-04-29 LAB — URINALYSIS, W/ REFLEX TO CULTURE (INFECTION SUSPECTED)
Bilirubin Urine: NEGATIVE
Glucose, UA: NEGATIVE mg/dL
Ketones, ur: NEGATIVE mg/dL
Nitrite: POSITIVE — AB
Protein, ur: 100 mg/dL — AB
RBC / HPF: >50 RBC/hpf (ref 0–5)
Specific Gravity, Urine: 1.023 (ref 1.005–1.030)
WBC, UA: >50 WBC/hpf (ref 0–5)
pH: 6 (ref 5.0–8.0)

## 2023-04-29 LAB — TROPONIN I (HIGH SENSITIVITY): Troponin I (High Sensitivity): 23432 ng/L (ref <18)

## 2023-04-29 LAB — CBC
HCT: 35.3 % — ABNORMAL LOW (ref 39.0–52.0)
Hemoglobin: 11.6 g/dL — ABNORMAL LOW (ref 13.0–17.0)
MCH: 32.4 pg (ref 26.0–34.0)
MCHC: 32.9 g/dL (ref 30.0–36.0)
MCV: 98.6 fL (ref 80.0–100.0)
Platelets: 182 10*3/uL (ref 150–400)
RBC: 3.58 MIL/uL — ABNORMAL LOW (ref 4.22–5.81)
RDW: 13.5 % (ref 11.5–15.5)
WBC: 10.9 10*3/uL — ABNORMAL HIGH (ref 4.0–10.5)
nRBC: 0 % (ref 0.0–0.2)

## 2023-04-29 LAB — HIV ANTIBODY (ROUTINE TESTING W REFLEX): HIV Screen 4th Generation wRfx: NONREACTIVE

## 2023-04-29 LAB — HEMOGLOBIN A1C
Hgb A1c MFr Bld: 5.5 % (ref 4.8–5.6)
Mean Plasma Glucose: 111.15 mg/dL

## 2023-04-29 MED ORDER — AMLODIPINE BESYLATE 5 MG PO TABS
5.0000 mg | ORAL_TABLET | Freq: Every day | ORAL | Status: DC
  Administered 2023-05-01: 5 mg via ORAL
  Filled 2023-04-29: qty 1

## 2023-04-29 MED ORDER — SODIUM CHLORIDE 0.9 % IV SOLN
500.0000 mg | INTRAVENOUS | Status: AC
  Administered 2023-04-29 – 2023-05-02 (×4): 500 mg via INTRAVENOUS
  Filled 2023-04-29 (×4): qty 5

## 2023-04-29 MED ORDER — FUROSEMIDE 10 MG/ML IJ SOLN
INTRAMUSCULAR | Status: AC
  Administered 2023-04-29: 20 mg
  Filled 2023-04-29: qty 2

## 2023-04-29 MED ORDER — IPRATROPIUM BROMIDE 0.02 % IN SOLN: 0.5000 mg | Freq: Four times a day (QID) | RESPIRATORY_TRACT | Status: DC | PRN

## 2023-04-29 MED ORDER — ATORVASTATIN CALCIUM 20 MG PO TABS
40.0000 mg | ORAL_TABLET | Freq: Every day | ORAL | Status: DC
  Administered 2023-04-30 – 2023-05-05 (×7): 40 mg via ORAL
  Filled 2023-04-29 (×7): qty 2

## 2023-04-29 MED ORDER — ALPRAZOLAM 0.25 MG PO TABS
0.2500 mg | ORAL_TABLET | Freq: Once | ORAL | Status: AC
  Administered 2023-04-29: 0.25 mg via ORAL
  Filled 2023-04-29: qty 1

## 2023-04-29 MED ORDER — SODIUM CHLORIDE 0.9 % IV SOLN
2.0000 g | INTRAVENOUS | Status: AC
  Administered 2023-04-29 – 2023-05-02 (×4): 2 g via INTRAVENOUS
  Filled 2023-04-29 (×4): qty 20

## 2023-04-29 MED ORDER — ASPIRIN 81 MG PO TBEC
81.0000 mg | DELAYED_RELEASE_TABLET | Freq: Every day | ORAL | Status: DC
  Administered 2023-04-29 – 2023-05-06 (×7): 81 mg via ORAL
  Filled 2023-04-29 (×8): qty 1

## 2023-04-29 MED ORDER — METOPROLOL TARTRATE 25 MG PO TABS
25.0000 mg | ORAL_TABLET | Freq: Two times a day (BID) | ORAL | Status: DC
  Administered 2023-04-30 (×3): 25 mg via ORAL
  Filled 2023-04-29 (×3): qty 1

## 2023-04-29 MED ORDER — FUROSEMIDE 10 MG/ML IJ SOLN
20.0000 mg | Freq: Once | INTRAMUSCULAR | Status: AC
  Administered 2023-04-29: 20 mg via INTRAVENOUS

## 2023-04-29 MED ORDER — HEPARIN BOLUS VIA INFUSION
2700.0000 [IU] | Freq: Once | INTRAVENOUS | Status: AC
  Administered 2023-04-29: 2700 [IU] via INTRAVENOUS
  Filled 2023-04-29: qty 2700

## 2023-04-29 NOTE — Progress Notes (Signed)
1 Day Post-Op  Subjective: Carles continues to have flank/back pain, left > right.  He has no fever or chills.  WBC count is falling.  He is afeb with stable VS.  Cr is normal.  Urine culture is pending.  His troponin is up to 23K and he will be having coronary angiography at the first of the week.   ROS:  Review of Systems  Constitutional:  Negative for chills and fever.  Gastrointestinal:  Positive for abdominal pain.  Genitourinary:  Positive for flank pain.    Anti-infectives: Anti-infectives (From admission, onward)    Start     Dose/Rate Route Frequency Ordered Stop   04/29/23 1200  cefTRIAXone (ROCEPHIN) 2 g in sodium chloride 0.9 % 100 mL IVPB        2 g 200 mL/hr over 30 Minutes Intravenous Every 24 hours 04/29/23 0738 05/03/23 1159   04/29/23 1000  azithromycin (ZITHROMAX) 500 mg in sodium chloride 0.9 % 250 mL IVPB        500 mg 250 mL/hr over 60 Minutes Intravenous Every 24 hours 04/29/23 0738 05/03/23 0959   04/28/23 1200  cefTRIAXone (ROCEPHIN) 1 g in sodium chloride 0.9 % 100 mL IVPB        1 g 200 mL/hr over 30 Minutes Intravenous  Once 04/28/23 1148 04/28/23 1353   04/28/23 1200  azithromycin (ZITHROMAX) 500 mg in sodium chloride 0.9 % 250 mL IVPB        500 mg 250 mL/hr over 60 Minutes Intravenous  Once 04/28/23 1148 04/28/23 1409       Current Facility-Administered Medications  Medication Dose Route Frequency Provider Last Rate Last Admin   acetaminophen (TYLENOL) tablet 650 mg  650 mg Oral Q6H PRN Reva Bores, MD   650 mg at 04/28/23 1948   Or   acetaminophen (TYLENOL) suppository 650 mg  650 mg Rectal Q6H PRN Reva Bores, MD       [START ON 04/30/2023] amLODipine (NORVASC) tablet 5 mg  5 mg Oral Daily Gollan, Tollie Pizza, MD       azithromycin (ZITHROMAX) 500 mg in sodium chloride 0.9 % 250 mL IVPB  500 mg Intravenous Q24H Reva Bores, MD 250 mL/hr at 04/29/23 1011 500 mg at 04/29/23 1011   cefTRIAXone (ROCEPHIN) 2 g in sodium chloride 0.9 % 100 mL IVPB  2  g Intravenous Q24H Reva Bores, MD 200 mL/hr at 04/29/23 1153 2 g at 04/29/23 1153   Chlorhexidine Gluconate Cloth 2 % PADS 6 each  6 each Topical Daily Reva Bores, MD   6 each at 04/29/23 1004   fentaNYL (SUBLIMAZE) injection 12.5-50 mcg  12.5-50 mcg Intravenous Q2H PRN Reva Bores, MD   50 mcg at 04/29/23 1357   folic acid injection 1 mg  1 mg Intravenous Daily Reva Bores, MD   1 mg at 04/29/23 1008   heparin ADULT infusion 100 units/mL (25000 units/214mL)  1,400 Units/hr Intravenous Continuous Mila Merry A, RPH 14 mL/hr at 04/29/23 1155 1,400 Units/hr at 04/29/23 1155   insulin aspart (novoLOG) injection 0-15 Units  0-15 Units Subcutaneous Q4H Reva Bores, MD   2 Units at 04/29/23 1154   insulin detemir (LEVEMIR) injection 5 Units  5 Units Subcutaneous QHS Reva Bores, MD   5 Units at 04/28/23 2252   ipratropium (ATROVENT) nebulizer solution 0.5 mg  0.5 mg Nebulization Q6H PRN Agbata, Tochukwu, MD       metoprolol tartrate (LOPRESSOR) injection 5  mg  5 mg Intravenous Q6H PRN Reva Bores, MD       metoprolol tartrate (LOPRESSOR) tablet 25 mg  25 mg Oral BID Agbata, Tochukwu, MD       ondansetron (ZOFRAN) tablet 4 mg  4 mg Oral Q6H PRN Reva Bores, MD       Or   ondansetron [facility]) injection 4 mg  4 mg Intravenous Q6H PRN Reva Bores, MD       oxyCODONE (Oxy IR/ROXICODONE) immediate release tablet 5 mg  5 mg Oral Q4H PRN Reva Bores, MD   5 mg at 04/28/23 1948   polyethylene glycol (MIRALAX / GLYCOLAX) packet 17 g  17 g Oral Daily PRN Reva Bores, MD       sodium chloride flush (NS) 0.9 % injection 3 mL  3 mL Intravenous Q12H Reva Bores, MD   3 mL at 04/29/23 1007   thiamine (VITAMIN B1) injection 100 mg  100 mg Intravenous Daily Reva Bores, MD   100 mg at 04/29/23 1008     Objective: Vital signs in last 24 hours: Temp:  [97.7 F (36.5 C)-98.3 F (36.8 C)] 97.8 F (36.6 C) (06/08 1200) Pulse Rate:  [65-122] 81 (06/08 1200) Resp:  [14-39] 20  (06/08 1000) BP: (96-157)/(63-106) 113/71 (06/08 1200) SpO2:  [77 %-98 %] 96 % (06/08 1200)  Intake/Output from previous day: 06/07 0701 - 06/08 0700 In: -  Out: 400 [Urine:400] Intake/Output this shift: Total I/O In: -  Out: 1000 [Urine:1000]   Physical Exam Vitals reviewed.  Constitutional:      Appearance: He is well-developed.  Abdominal:     Palpations: Abdomen is soft.     Tenderness: There is abdominal tenderness (bilateral flank, left > right).     Comments: Right flank incisional hernia/laxity  Neurological:     Mental Status: He is alert.     Lab Results:  Recent Labs    04/28/23 1130 04/29/23 0507  WBC 14.4* 10.9*  HGB 12.8* 11.6*  HCT 39.8 35.3*  PLT 255 182   BMET Recent Labs    04/28/23 1130 04/29/23 0507  NA 138 139  K 4.1 4.4  CL 104 108  CO2 22 22  GLUCOSE 293* 146*  BUN 30* 27*  CREATININE 1.21 0.93  CALCIUM 9.8 8.8*   PT/INR Recent Labs    04/29/23 0507  LABPROT 14.8  INR 1.1   ABG No results for input(s): "PHART", "HCO3" in the last 72 hours.  Invalid input(s): "PCO2", "PO2"  Studies/Results: CT Renal Stone Study  Result Date: 04/28/2023 CLINICAL DATA:  CTA of the chest performed for left-sided chest pain demonstrated evidence of a staghorn calculus in the visualized upper right kidney and evidence of probable significant hydronephrosis of the visualized upper left kidney. EXAM: CT ABDOMEN AND PELVIS WITHOUT CONTRAST TECHNIQUE: Multidetector CT imaging of the abdomen and pelvis was performed following the standard protocol without IV contrast. RADIATION DOSE REDUCTION: This exam was performed according to the departmental dose-optimization program which includes automated exposure control, adjustment of the mA and/or kV according to patient size and/or use of iterative reconstruction technique. COMPARISON:  CT a of the chest earlier today. Prior CT of the abdomen and pelvis without contrast on 07/23/2011. FINDINGS: Lower chest:  Scattered scarring and atelectasis at both lung bases. Large hiatal hernia. Hepatobiliary: The liver does not appear overtly cirrhotic by abdominal CT. At least 1 gallstone is identified in the gallbladder lumen. Gallbladder is decompressed with  potentially some fundal wall thickening which may implicate some chronic inflammation. No biliary ductal dilatation. Pancreas: Unremarkable. No pancreatic ductal dilatation or surrounding inflammatory changes. Spleen: Normal in size without focal abnormality. Adrenals/Urinary Tract: Evaluation of the kidneys is somewhat challenging due to the administration of IV contrast for CTA of the chest over 1 hour prior to the abdominal and pelvic CT. There is evidence of a staghorn calculus within the upper pole collecting system of the right kidney as well as an additional calculus in the lower pole. Some excretion of contrast is present surrounding calculi as well as in the interpolar kidney and contrast is also seen in the right ureter. The right renal pelvis appears thickened and stenotic. This also appeared thickened in 2012 and may reflect chronic inflammation. Ultimately correlation with ureteroscopy may be beneficial in evaluation for potential underlying urothelial neoplasm. The left kidney demonstrates significant hydronephrosis with some partial excretion of contrast in the lower pole, a partial staghorn calculus in the lower pole measuring up to approximately 2.3 cm in greatest diameter and under obstructing calculus in the ureter at the juncture of proximal and mid thirds measuring 16 mm in height and 8 x 8 mm in transverse dimensions. Cortical thinning of the left kidney implicates a chronic obstruction. The bladder is filled with excreted contrast and is unremarkable in appearance. Stomach/Bowel: Bowel shows no evidence of obstruction, ileus, inflammation or lesion. The appendix is normal. No free intraperitoneal air. Vascular/Lymphatic: Atherosclerosis of the  abdominal aorta without evidence of aneurysm. No lymphadenopathy identified. Reproductive: Prostate is unremarkable. Other: Small left inguinal hernia containing fat. No abdominopelvic ascites. Musculoskeletal: No acute or significant osseous findings. IMPRESSION: 1. 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 Partial staghorn calculus of the lower pole of the left kidney. 2. There is evidence of significant hydronephrosis of the left kidney with cortical thinning and an obstructing calculus in the ureter at the juncture of proximal and mid thirds measuring 16 mm in height and 8 x 8 mm in transverse dimensions. Cortical thinning implicates a chronic obstruction. 3. The right renal pelvis appears thickened and stenotic. This also appeared thickened in 2012 and may reflect chronic inflammation. Ultimately correlation with ureteroscopy may be beneficial in evaluation for potential underlying urothelial neoplasm. 4. Cholelithiasis with decompressed gallbladder and potentially some fundal wall thickening which may implicate some chronic inflammation. 5. Large hiatal hernia. 6. Small left inguinal hernia containing fat. 7. Aortic atherosclerosis. Aortic Atherosclerosis (ICD10-I70.0). Electronically Signed   By: Irish Lack M.D.   On: 04/28/2023 14:54   CT Angio Chest PE W/Cm &/Or Wo Cm  Result Date: 04/28/2023 CLINICAL DATA:  Clinical concern for pulmonary embolism. EXAM: CT ANGIOGRAPHY CHEST WITH CONTRAST TECHNIQUE: Multidetector CT imaging of the chest was performed using the standard protocol during bolus administration of intravenous contrast. Multiplanar CT image reconstructions and MIPs were obtained to evaluate the vascular anatomy. RADIATION DOSE REDUCTION: This exam was performed according to the departmental dose-optimization program which includes automated exposure control, adjustment of the mA  and/or kV according to patient size and/or use of iterative reconstruction technique. CONTRAST:  75mL OMNIPAQUE IOHEXOL 350 MG/ML SOLN COMPARISON:  None Available. FINDINGS: Cardiovascular: Heart is enlarged. No substantial pericardial effusion. Ascending thoracic aorta measures up to 4.2 cm diameter. Enlargement of the pulmonary outflow tract/main pulmonary arteries suggests pulmonary arterial hypertension. There is no filling defect within the opacified pulmonary arteries to suggest  the presence of an acute pulmonary embolus. Mediastinum/Nodes: Scattered upper normal mediastinal lymph nodes identified with 14 mm short axis precarinal node on 52/4. There is no hilar lymphadenopathy. Large hiatal hernia. The esophagus has normal imaging features. There is no axillary lymphadenopathy. Lungs/Pleura: Diffuse interlobular septal thickening evident with scattered areas of peripheral in central nodular/patchy ground-glass opacity. 19 mm ground-glass nodule identified superior segment right lower lobe on 65/5. No dense focal airspace consolidation. Trace bilateral pleural effusions. Upper Abdomen: Nodular liver contour raises concern for cirrhosis. 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 Left kidney incompletely visualized but demonstrates hydronephrosis with overlying cortical thinning. Exophytic lesion upper pole right kidney measures 2.8 cm in approaches water density but has been incompletely visualized. 19 mm left adrenal adenoma. No followup imaging is recommended. Musculoskeletal: No worrisome lytic or sclerotic osseous abnormality. Review of the MIP images confirms the above findings. IMPRESSION: 1. No CT evidence for acute pulmonary embolus. 2. Diffuse interlobular septal thickening with scattered areas of peripheral and central nodular/patchy ground-glass opacity. Imaging features are most suggestive of pulmonary edema. 3. 19 mm ground-glass nodule  superior segment right lower lobe. Potentially related to the edema. Non-contrast chest CT at 3-6 months is recommended. If the ground glass opacities persist, subsequent management will be based upon the most suspicious findings. This recommendation follows the consensus statement: Guidelines for Management of Incidental Pulmonary Nodules Detected on CT Images: From the Fleischner Society 2017; Radiology 2017; 281: 228-243. 4. Trace bilateral pleural effusions. 5. Nodular liver contour raises concern for cirrhosis. 6. 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 Left kidney incompletely visualized but demonstrates hydronephrosis with overlying cortical thinning. Urology consultation recommended. 7. 19 mm left adrenal adenoma. No followup imaging is recommended. Electronically Signed   By: Kennith Center M.D.   On: 04/28/2023 13:19   DG Chest 1 View  Result Date: 04/28/2023 CLINICAL DATA:  Shortness of breath and chest pain. EXAM: CHEST  1 VIEW COMPARISON:  06/01/2009 FINDINGS: Stable top-normal heart size and visible large hiatal hernia. Diffuse pulmonary edema most likely reflects congestive heart failure. No significant pleural fluid identified at by frontal chest radiograph. No pneumothorax or focal airspace consolidation. IMPRESSION: Diffuse pulmonary edema most likely reflecting congestive heart failure. Large hiatal hernia. Electronically Signed   By: Irish Lack M.D.   On: 04/28/2023 12:16     Assessment and Plan: Left proximal ureteral obstruction with pain.   I discussed a stent vs nephrostomy tube with him again and I think he is more willing to consider a stent but that will need to wait until he is cleared by cardiology for anesthesia.  If he does need a stent and antiplatelet therapy, he would still be a candidate for a stent and subsequent ureteroscopy.  Bilateral renal stones.   The right renal stone will eventually need PCNL but that  kidney is not obstructed so it can be done electively.  There is a smaller stone burden on the left which could be possibly managed ureteroscopically.   Possible UTI.   He is afebrile with stable VS and a normal Cr on current therapy.   The culture is pending.  His WBC is declining.       LOS: 1 day    Bjorn Pippin 6/8/2024Patient ID: Ronnie Mann, male   DOB: 17-Nov-1956, 67 y.o.   MRN: [id number]

## 2023-04-29 NOTE — Progress Notes (Incomplete)
1 Day Post-Op  Subjective: Ronnie Mann continues to have bilateral flank pain.  The right is chronic.  He is afebrile with stable VS and a declining WBC.  Urine culture is pending.   His troponin [place] 234[address] and he is felt to need coronary angiography with possible stent insertion.    ROS:  Review of Systems  Constitutional:  Negative for chills and fever.  Cardiovascular:  Negative for chest pain.  Gastrointestinal:  Negative for nausea.  Genitourinary:  Positive for flank pain.    Anti-infectives: Anti-infectives (From admission, onward)    Start     Dose/Rate Route Frequency Ordered Stop   04/29/23 1200  cefTRIAXone (ROCEPHIN) 2 g in sodium chloride 0.9 % 100 mL IVPB        2 g 200 mL/hr over 30 Minutes Intravenous Every 24 hours 04/29/23 0738 05/03/23 1159   04/29/23 1000  azithromycin (ZITHROMAX) 500 mg in sodium chloride 0.9 % 250 mL IVPB        500 mg 250 mL/hr over 60 Minutes Intravenous Every 24 hours 04/29/23 0738 05/03/23 0959   04/28/23 1200  cefTRIAXone (ROCEPHIN) 1 g in sodium chloride 0.9 % 100 mL IVPB        1 g 200 mL/hr over 30 Minutes Intravenous  Once 04/28/23 1148 04/28/23 1353   04/28/23 1200  azithromycin (ZITHROMAX) 500 mg in sodium chloride 0.9 % 250 mL IVPB        500 mg 250 mL/hr over 60 Minutes Intravenous  Once 04/28/23 1148 04/28/23 1409       Current Facility-Administered Medications  Medication Dose Route Frequency Provider Last Rate Last Admin  . 0.9 %  sodium chloride infusion   Intravenous Continuous Reva Bores, MD 75 mL/hr at 04/28/23 1711 New Bag at 04/28/23 1711  . acetaminophen (TYLENOL) tablet 650 mg  650 mg Oral Q6H PRN Reva Bores, MD   650 mg at 04/28/23 1948   Or  . acetaminophen (TYLENOL) suppository 650 mg  650 mg Rectal Q6H PRN Reva Bores, MD      . Melene Muller ON 04/30/2023] amLODipine (NORVASC) tablet 5 mg  5 mg Oral Daily Gollan, Tollie Pizza, MD      . azithromycin (ZITHROMAX) 500 mg in sodium chloride 0.9 % 250 mL IVPB  500 mg  Intravenous Q24H Reva Bores, MD 250 mL/hr at 04/29/23 1011 500 mg at 04/29/23 1011  . cefTRIAXone (ROCEPHIN) 2 g in sodium chloride 0.9 % 100 mL IVPB  2 g Intravenous Q24H Reva Bores, MD      . Chlorhexidine Gluconate Cloth 2 % PADS 6 each  6 each Topical Daily Reva Bores, MD   6 each at 04/29/23 1004  . fentaNYL (SUBLIMAZE) injection 12.5-50 mcg  12.5-50 mcg Intravenous Q2H PRN Reva Bores, MD   50 mcg at 04/29/23 1020  . folic acid injection 1 mg  1 mg Intravenous Daily Reva Bores, MD   1 mg at 04/29/23 1008  . heparin ADULT infusion 100 units/mL (25000 units/221mL)  1,400 Units/hr Intravenous Continuous Mila Merry A, RPH 11 mL/hr at 04/28/23 1755 1,100 Units/hr at 04/28/23 1755  . heparin bolus via infusion 2,700 Units  2,700 Units Intravenous Once Mila Merry A, RPH      . insulin aspart (novoLOG) injection 0-15 Units  0-15 Units Subcutaneous Q4H Reva Bores, MD   2 Units at 04/29/23 1004  . insulin detemir (LEVEMIR) injection 5 Units  5 Units Subcutaneous QHS Shawnie Pons,  Shelbie Proctor, MD   5 Units at 04/28/23 2252  . ipratropium (ATROVENT) nebulizer solution 0.5 mg  0.5 mg Nebulization Q6H PRN Agbata, Tochukwu, MD      . metoprolol tartrate (LOPRESSOR) injection 5 mg  5 mg Intravenous Q6H PRN Reva Bores, MD      . metoprolol tartrate (LOPRESSOR) tablet 25 mg  25 mg Oral Q6H Reva Bores, MD   25 mg at 04/28/23 2251  . ondansetron (ZOFRAN) tablet 4 mg  4 mg Oral Q6H PRN Reva Bores, MD       Or  . ondansetron [facility]) injection 4 mg  4 mg Intravenous Q6H PRN Reva Bores, MD      . oxyCODONE (Oxy IR/ROXICODONE) immediate release tablet 5 mg  5 mg Oral Q4H PRN Reva Bores, MD   5 mg at 04/28/23 1948  . polyethylene glycol (MIRALAX / GLYCOLAX) packet 17 g  17 g Oral Daily PRN Reva Bores, MD      . sodium chloride flush (NS) 0.9 % injection 3 mL  3 mL Intravenous Q12H Reva Bores, MD   3 mL at 04/29/23 1007  . thiamine (VITAMIN B1) injection 100 mg  100 mg  Intravenous Daily Reva Bores, MD   100 mg at 04/29/23 1008     Objective: Vital signs in last 24 hours: Temp:  [97.7 F (36.5 C)-98.3 F (36.8 C)] 98 F (36.7 C) (06/08 0800) Pulse Rate:  [65-137] 81 (06/08 1100) Resp:  [11-39] 20 (06/08 1000) BP: (96-175)/(63-109) 108/77 (06/08 1100) SpO2:  [77 %-98 %] 96 % (06/08 1100)  Intake/Output from previous day: 06/07 0701 - 06/08 0700 In: -  Out: 400 [Urine:400] Intake/Output this shift: No intake/output data recorded.   Physical Exam Vitals reviewed.  Constitutional:      Appearance: He is well-developed.  Abdominal:     Palpations: Abdomen is soft.     Tenderness: There is abdominal tenderness (bilateral CVAT, left > right.).     Comments: Right flank incisional hernia.   Neurological:     Mental Status: He is alert.     Lab Results:  Recent Labs    04/28/23 1130 04/29/23 0507  WBC 14.4* 10.9*  HGB 12.8* 11.6*  HCT 39.8 35.3*  PLT 255 182   BMET Recent Labs    04/28/23 1130 04/29/23 0507  NA 138 139  K 4.1 4.4  CL 104 108  CO2 22 22  GLUCOSE 293* 146*  BUN 30* 27*  CREATININE 1.21 0.93  CALCIUM 9.8 8.8*   PT/INR Recent Labs    04/29/23 0507  LABPROT 14.8  INR 1.1   ABG No results for input(s): "PHART", "HCO3" in the last 72 hours.  Invalid input(s): "PCO2", "PO2"  Studies/Results: CT Renal Stone Study  Result Date: 04/28/2023 CLINICAL DATA:  CTA of the chest performed for left-sided chest pain demonstrated evidence of a staghorn calculus in the visualized upper right kidney and evidence of probable significant hydronephrosis of the visualized upper left kidney. EXAM: CT ABDOMEN AND PELVIS WITHOUT CONTRAST TECHNIQUE: Multidetector CT imaging of the abdomen and pelvis was performed following the standard protocol without IV contrast. RADIATION DOSE REDUCTION: This exam was performed according to the departmental dose-optimization program which includes automated exposure control, adjustment of  the mA and/or kV according to patient size and/or use of iterative reconstruction technique. COMPARISON:  CT a of the chest earlier today. Prior CT of the abdomen and pelvis without contrast on 07/23/2011.  FINDINGS: Lower chest: Scattered scarring and atelectasis at both lung bases. Large hiatal hernia. Hepatobiliary: The liver does not appear overtly cirrhotic by abdominal CT. At least 1 gallstone is identified in the gallbladder lumen. Gallbladder is decompressed with potentially some fundal wall thickening which may implicate some chronic inflammation. No biliary ductal dilatation. Pancreas: Unremarkable. No pancreatic ductal dilatation or surrounding inflammatory changes. Spleen: Normal in size without focal abnormality. Adrenals/Urinary Tract: Evaluation of the kidneys is somewhat challenging due to the administration of IV contrast for CTA of the chest over 1 hour prior to the abdominal and pelvic CT. There is evidence of a staghorn calculus within the upper pole collecting system of the right kidney as well as an additional calculus in the lower pole. Some excretion of contrast is present surrounding calculi as well as in the interpolar kidney and contrast is also seen in the right ureter. The right renal pelvis appears thickened and stenotic. This also appeared thickened in 2012 and may reflect chronic inflammation. Ultimately correlation with ureteroscopy may be beneficial in evaluation for potential underlying urothelial neoplasm. The left kidney demonstrates significant hydronephrosis with some partial excretion of contrast in the lower pole, a partial staghorn calculus in the lower pole measuring up to approximately 2.3 cm in greatest diameter and under obstructing calculus in the ureter at the juncture of proximal and mid thirds measuring 16 mm in height and 8 x 8 mm in transverse dimensions. Cortical thinning of the left kidney implicates a chronic obstruction. The bladder is filled with excreted  contrast and is unremarkable in appearance. Stomach/Bowel: Bowel shows no evidence of obstruction, ileus, inflammation or lesion. The appendix is normal. No free intraperitoneal air. Vascular/Lymphatic: Atherosclerosis of the abdominal aorta without evidence of aneurysm. No lymphadenopathy identified. Reproductive: Prostate is unremarkable. Other: Small left inguinal hernia containing fat. No abdominopelvic ascites. Musculoskeletal: No acute or significant osseous findings. IMPRESSION: 1. 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 Partial staghorn calculus of the lower pole of the left kidney. 2. There is evidence of significant hydronephrosis of the left kidney with cortical thinning and an obstructing calculus in the ureter at the juncture of proximal and mid thirds measuring 16 mm in height and 8 x 8 mm in transverse dimensions. Cortical thinning implicates a chronic obstruction. 3. The right renal pelvis appears thickened and stenotic. This also appeared thickened in 2012 and may reflect chronic inflammation. Ultimately correlation with ureteroscopy may be beneficial in evaluation for potential underlying urothelial neoplasm. 4. Cholelithiasis with decompressed gallbladder and potentially some fundal wall thickening which may implicate some chronic inflammation. 5. Large hiatal hernia. 6. Small left inguinal hernia containing fat. 7. Aortic atherosclerosis. Aortic Atherosclerosis (ICD10-I70.0). Electronically Signed   By: Irish Lack M.D.   On: 04/28/2023 14:54   CT Angio Chest PE W/Cm &/Or Wo Cm  Result Date: 04/28/2023 CLINICAL DATA:  Clinical concern for pulmonary embolism. EXAM: CT ANGIOGRAPHY CHEST WITH CONTRAST TECHNIQUE: Multidetector CT imaging of the chest was performed using the standard protocol during bolus administration of intravenous contrast. Multiplanar CT image reconstructions and  MIPs were obtained to evaluate the vascular anatomy. RADIATION DOSE REDUCTION: This exam was performed according to the departmental dose-optimization program which includes automated exposure control, adjustment of the mA and/or kV according to patient size and/or use of iterative reconstruction technique. CONTRAST:  75mL OMNIPAQUE IOHEXOL 350 MG/ML SOLN COMPARISON:  None Available. FINDINGS: Cardiovascular: Heart  is enlarged. No substantial pericardial effusion. Ascending thoracic aorta measures up to 4.2 cm diameter. Enlargement of the pulmonary outflow tract/main pulmonary arteries suggests pulmonary arterial hypertension. There is no filling defect within the opacified pulmonary arteries to suggest the presence of an acute pulmonary embolus. Mediastinum/Nodes: Scattered upper normal mediastinal lymph nodes identified with 14 mm short axis precarinal node on 52/4. There is no hilar lymphadenopathy. Large hiatal hernia. The esophagus has normal imaging features. There is no axillary lymphadenopathy. Lungs/Pleura: Diffuse interlobular septal thickening evident with scattered areas of peripheral in central nodular/patchy ground-glass opacity. 19 mm ground-glass nodule identified superior segment right lower lobe on 65/5. No dense focal airspace consolidation. Trace bilateral pleural effusions. Upper Abdomen: Nodular liver contour raises concern for cirrhosis. 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 Left kidney incompletely visualized but demonstrates hydronephrosis with overlying cortical thinning. Exophytic lesion upper pole right kidney measures 2.8 cm in approaches water density but has been incompletely visualized. 19 mm left adrenal adenoma. No followup imaging is recommended. Musculoskeletal: No worrisome lytic or sclerotic osseous abnormality. Review of the MIP images confirms the above findings. IMPRESSION: 1. No CT evidence for acute pulmonary  embolus. 2. Diffuse interlobular septal thickening with scattered areas of peripheral and central nodular/patchy ground-glass opacity. Imaging features are most suggestive of pulmonary edema. 3. 19 mm ground-glass nodule superior segment right lower lobe. Potentially related to the edema. Non-contrast chest CT at 3-6 months is recommended. If the ground glass opacities persist, subsequent management will be based upon the most suspicious findings. This recommendation follows the consensus statement: Guidelines for Management of Incidental Pulmonary Nodules Detected on CT Images: From the Fleischner Society 2017; Radiology 2017; 281: 228-243. 4. Trace bilateral pleural effusions. 5. Nodular liver contour raises concern for cirrhosis. 6. 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 Left kidney incompletely visualized but demonstrates hydronephrosis with overlying cortical thinning. Urology consultation recommended. 7. 19 mm left adrenal adenoma. No followup imaging is recommended. Electronically Signed   By: Kennith Center M.D.   On: 04/28/2023 13:19   DG Chest 1 View  Result Date: 04/28/2023 CLINICAL DATA:  Shortness of breath and chest pain. EXAM: CHEST  1 VIEW COMPARISON:  06/01/2009 FINDINGS: Stable top-normal heart size and visible large hiatal hernia. Diffuse pulmonary edema most likely reflects congestive heart failure. No significant pleural fluid identified at by frontal chest radiograph. No pneumothorax or focal airspace consolidation. IMPRESSION: Diffuse pulmonary edema most likely reflecting congestive heart failure. Large hiatal hernia. Electronically Signed   By: Irish Lack M.D.   On: 04/28/2023 12:16     Assessment and Plan: Left proximal ureteral stone with hydro and flank pain.   I discussed his options again including a stent or a nephrostomy tube.   He is more agreeable to a stent than he was yesterday and that could be done on  anticoagulation/antiplatelet therapy.     Renal stones  Elevated troponin with CHF.   I will have cardiology comment on whether he should have the coronary cath prior to a ureteral stent or not.    Possible UTI.  Culture is pending. He is afebrile with stable VS and a declining WBC.      LOS: 1 day    Ronnie Mann 6/8/2024Patient ID: Ronnie Mann, male   DOB: August 10, 1956, 67 y.o.   MRN: [id number]

## 2023-04-29 NOTE — Progress Notes (Addendum)
Rounding Note    Patient Name: Ronnie Mann Date of Encounter: 04/29/2023  [facility] Cardiologist: [facility]  Subjective   Reports some positional right flank and back pain, symptoms improved sitting up, worse when lying back Poor sleep last night, anxious Left arm pain and left side chest pain has resolved around midnight Significant increase in troponin from 200 now 23,000 Was started on heparin infusion yesterday  Inpatient Medications    Scheduled Meds:  amLODipine  5 mg Oral BID   Chlorhexidine Gluconate Cloth  6 each Topical Daily   folic acid  1 mg Intravenous Daily   insulin aspart  0-15 Units Subcutaneous Q4H   insulin detemir  5 Units Subcutaneous QHS   metoprolol tartrate  25 mg Oral Q6H   sodium chloride flush  3 mL Intravenous Q12H   thiamine (VITAMIN B1) injection  100 mg Intravenous Daily   Continuous Infusions:  sodium chloride 75 mL/hr at 04/28/23 1711   azithromycin 500 mg (04/29/23 1011)   cefTRIAXone (ROCEPHIN)  IV     heparin 1,100 Units/hr (04/28/23 1755)   PRN Meds: acetaminophen **OR** acetaminophen, fentaNYL (SUBLIMAZE) injection, ipratropium, metoprolol tartrate, ondansetron **OR** ondansetron (ZOFRAN) IV, oxyCODONE, polyethylene glycol   Vital Signs    Vitals:   04/29/23 0700 04/29/23 0800 04/29/23 1000 04/29/23 1100  BP: 119/65 111/65 103/66 108/77  Pulse: 79 84 86 81  Resp: 17 17 20    Temp:  98 F (36.7 C)    TempSrc:  Oral    SpO2: 96% 95% 96% 96%  Weight:      Height:        Intake/Output Summary (Last 24 hours) at 04/29/2023 1120 Last data filed at 04/28/2023 2300 Gross per 24 hour  Intake --  Output 400 ml  Net -400 ml      04/28/2023   11:12 AM 07/01/2020    4:02 PM  Last 3 Weights  Weight (lbs) 210 lb 230 lb  Weight (kg) 95.255 kg 104.327 kg      Telemetry    Normal sinus rhythm- Personally Reviewed  ECG     - Personally Reviewed  Physical Exam   GEN: No acute distress.   Neck: No JVD Cardiac: RRR,  no murmurs, rubs, or gallops.  Respiratory: Clear to auscultation bilaterally. GI: Soft, nontender, non-distended  MS: No edema; No deformity. Neuro:  Nonfocal  Psych: Normal affect   Labs    High Sensitivity Troponin:   Recent Labs  Lab 04/28/23 1130 04/28/23 1347 04/29/23 0504  TROPONINIHS 54* 234* 23,432*     Chemistry Recent Labs  Lab 04/28/23 1130 04/29/23 0507  NA 138 139  K 4.1 4.4  CL 104 108  CO2 22 22  GLUCOSE 293* 146*  BUN 30* 27*  CREATININE 1.21 0.93  CALCIUM 9.8 8.8*  PROT 7.7 7.1  ALBUMIN 4.2 4.1  AST 32 122*  ALT 22 30  ALKPHOS 101 93  BILITOT 0.8 1.1  GFRNONAA >60 >60  ANIONGAP 12 9    Lipids No results for input(s): "CHOL", "TRIG", "HDL", "LABVLDL", "LDLCALC", "CHOLHDL" in the last 168 hours.  Hematology Recent Labs  Lab 04/28/23 1130 04/29/23 0507  WBC 14.4* 10.9*  RBC 3.98* 3.58*  HGB 12.8* 11.6*  HCT 39.8 35.3*  MCV 100.0 98.6  MCH 32.2 32.4  MCHC 32.2 32.9  RDW 13.3 13.5  PLT 255 182   Thyroid  Recent Labs  Lab 04/28/23 1130  TSH 4.483    BNP Recent Labs  Lab 04/28/23 1130  BNP 530.9*    DDimer No results for input(s): "DDIMER" in the last 168 hours.   Radiology    CT Renal Stone Study  Result Date: 04/28/2023 CLINICAL DATA:  CTA of the chest performed for left-sided chest pain demonstrated evidence of a staghorn calculus in the visualized upper right kidney and evidence of probable significant hydronephrosis of the visualized upper left kidney. EXAM: CT ABDOMEN AND PELVIS WITHOUT CONTRAST TECHNIQUE: Multidetector CT imaging of the abdomen and pelvis was performed following the standard protocol without IV contrast. RADIATION DOSE REDUCTION: This exam was performed according to the departmental dose-optimization program which includes automated exposure control, adjustment of the mA and/or kV according to patient size and/or use of iterative reconstruction technique. COMPARISON:  CT a of the chest earlier today. Prior CT  of the abdomen and pelvis without contrast on 07/23/2011. FINDINGS: Lower chest: Scattered scarring and atelectasis at both lung bases. Large hiatal hernia. Hepatobiliary: The liver does not appear overtly cirrhotic by abdominal CT. At least 1 gallstone is identified in the gallbladder lumen. Gallbladder is decompressed with potentially some fundal wall thickening which may implicate some chronic inflammation. No biliary ductal dilatation. Pancreas: Unremarkable. No pancreatic ductal dilatation or surrounding inflammatory changes. Spleen: Normal in size without focal abnormality. Adrenals/Urinary Tract: Evaluation of the kidneys is somewhat challenging due to the administration of IV contrast for CTA of the chest over 1 hour prior to the abdominal and pelvic CT. There is evidence of a staghorn calculus within the upper pole collecting system of the right kidney as well as an additional calculus in the lower pole. Some excretion of contrast is present surrounding calculi as well as in the interpolar kidney and contrast is also seen in the right ureter. The right renal pelvis appears thickened and stenotic. This also appeared thickened in 2012 and may reflect chronic inflammation. Ultimately correlation with ureteroscopy may be beneficial in evaluation for potential underlying urothelial neoplasm. The left kidney demonstrates significant hydronephrosis with some partial excretion of contrast in the lower pole, a partial staghorn calculus in the lower pole measuring up to approximately 2.3 cm in greatest diameter and under obstructing calculus in the ureter at the juncture of proximal and mid thirds measuring 16 mm in height and 8 x 8 mm in transverse dimensions. Cortical thinning of the left kidney implicates a chronic obstruction. The bladder is filled with excreted contrast and is unremarkable in appearance. Stomach/Bowel: Bowel shows no evidence of obstruction, ileus, inflammation or lesion. The appendix is normal.  No free intraperitoneal air. Vascular/Lymphatic: Atherosclerosis of the abdominal aorta without evidence of aneurysm. No lymphadenopathy identified. Reproductive: Prostate is unremarkable. Other: Small left inguinal hernia containing fat. No abdominopelvic ascites. Musculoskeletal: No acute or significant osseous findings. IMPRESSION: 1. 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 Partial staghorn calculus of the lower pole of the left kidney. 2. There is evidence of significant hydronephrosis of the left kidney with cortical thinning and an obstructing calculus in the ureter at the juncture of proximal and mid thirds measuring 16 mm in height and 8 x 8 mm in transverse dimensions. Cortical thinning implicates a chronic obstruction. 3. The right renal pelvis appears thickened and stenotic. This also appeared thickened in 2012 and may reflect chronic inflammation. Ultimately correlation with ureteroscopy may be beneficial in evaluation for potential underlying urothelial neoplasm. 4. Cholelithiasis with decompressed gallbladder and potentially some  fundal wall thickening which may implicate some chronic inflammation. 5. Large hiatal hernia. 6. Small left inguinal hernia containing fat. 7. Aortic atherosclerosis. Aortic Atherosclerosis (ICD10-I70.0). Electronically Signed   By: Irish Lack M.D.   On: 04/28/2023 14:54   CT Angio Chest PE W/Cm &/Or Wo Cm  Result Date: 04/28/2023 CLINICAL DATA:  Clinical concern for pulmonary embolism. EXAM: CT ANGIOGRAPHY CHEST WITH CONTRAST TECHNIQUE: Multidetector CT imaging of the chest was performed using the standard protocol during bolus administration of intravenous contrast. Multiplanar CT image reconstructions and MIPs were obtained to evaluate the vascular anatomy. RADIATION DOSE REDUCTION: This exam was performed according to the departmental dose-optimization  program which includes automated exposure control, adjustment of the mA and/or kV according to patient size and/or use of iterative reconstruction technique. CONTRAST:  75mL OMNIPAQUE IOHEXOL 350 MG/ML SOLN COMPARISON:  None Available. FINDINGS: Cardiovascular: Heart is enlarged. No substantial pericardial effusion. Ascending thoracic aorta measures up to 4.2 cm diameter. Enlargement of the pulmonary outflow tract/main pulmonary arteries suggests pulmonary arterial hypertension. There is no filling defect within the opacified pulmonary arteries to suggest the presence of an acute pulmonary embolus. Mediastinum/Nodes: Scattered upper normal mediastinal lymph nodes identified with 14 mm short axis precarinal node on 52/4. There is no hilar lymphadenopathy. Large hiatal hernia. The esophagus has normal imaging features. There is no axillary lymphadenopathy. Lungs/Pleura: Diffuse interlobular septal thickening evident with scattered areas of peripheral in central nodular/patchy ground-glass opacity. 19 mm ground-glass nodule identified superior segment right lower lobe on 65/5. No dense focal airspace consolidation. Trace bilateral pleural effusions. Upper Abdomen: Nodular liver contour raises concern for cirrhosis. 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 Left kidney incompletely visualized but demonstrates hydronephrosis with overlying cortical thinning. Exophytic lesion upper pole right kidney measures 2.8 cm in approaches water density but has been incompletely visualized. 19 mm left adrenal adenoma. No followup imaging is recommended. Musculoskeletal: No worrisome lytic or sclerotic osseous abnormality. Review of the MIP images confirms the above findings. IMPRESSION: 1. No CT evidence for acute pulmonary embolus. 2. Diffuse interlobular septal thickening with scattered areas of peripheral and central nodular/patchy ground-glass opacity. Imaging features  are most suggestive of pulmonary edema. 3. 19 mm ground-glass nodule superior segment right lower lobe. Potentially related to the edema. Non-contrast chest CT at 3-6 months is recommended. If the ground glass opacities persist, subsequent management will be based upon the most suspicious findings. This recommendation follows the consensus statement: Guidelines for Management of Incidental Pulmonary Nodules Detected on CT Images: From the Fleischner Society 2017; Radiology 2017; 281: 228-243. 4. Trace bilateral pleural effusions. 5. Nodular liver contour raises concern for cirrhosis. 6. 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 Left kidney incompletely visualized but demonstrates hydronephrosis with overlying cortical thinning. Urology consultation recommended. 7. 19 mm left adrenal adenoma. No followup imaging is recommended. Electronically Signed   By: Kennith Center M.D.   On: 04/28/2023 13:19   DG Chest 1 View  Result Date: 04/28/2023 CLINICAL DATA:  Shortness of breath and chest pain. EXAM: CHEST  1 VIEW COMPARISON:  06/01/2009 FINDINGS: Stable top-normal heart size and visible large hiatal hernia. Diffuse pulmonary edema most likely reflects congestive heart failure. No significant pleural fluid identified at by frontal chest radiograph. No pneumothorax or focal airspace consolidation. IMPRESSION: Diffuse pulmonary edema most likely reflecting congestive heart failure. Large hiatal hernia. Electronically Signed   By: Irish Lack  M.D.   On: 04/28/2023 12:16    Cardiac Studies     Patient Profile     Mr. Arash Heap is a 67 year old gentleman with long history of kidney stones, presenting April 28 2023 to the emergency room with left-sided chest pain radiating to left arm, chronic shortness of breath for more than 1 month with chest congestion   Assessment & Plan    Non-STEMI Presenting with left chest, left arm pain Initial troponin 50,  repeat 240, up to 23,000 Echocardiogram pending Started on heparin infusion yesterday Plan for cardiac catheterization Monday if stable from a urologic perspective   Sinus tachycardia Continue metoprolol tartrate every 6hrs   Essential hypertension Continue metoprolol tartrate as above amlodipine to 5 mg daily   Kidney stones with hydronephrosis/urinary tract infection Evaluated by urology Patient declining ureteral stent as of yesterday On antibiotics for UTI, positional pain better sitting up worse laying back   Shortness of breath, respiratory distress COVID-negative, respiratory panel negative CT scan with groundglass opacities noted concerning for infection Started on Z-Pak and ceftriaxone    Total encounter time more than 50 minutes  Greater than 50% was spent in counseling and coordination of care with the patient   For questions or updates, please contact [facility] Please consult [email or web address] for contact info under        Signed, Julien Nordmann, MD  04/29/2023, 11:20 AM

## 2023-04-29 NOTE — Consult Note (Signed)
ANTICOAGULATION CONSULT NOTE  Pharmacy Consult for Heparin Infusion  Indication: chest pain/ACS  No Known Allergies  Patient Measurements: Height: 5\' 9"  (175.3 cm) Weight: 95.3 kg (210 lb) IBW/kg (Calculated) : 70.7 Heparin Dosing Weight: 90.4 kg  Vital Signs: Temp: 97.8 F (36.6 C) (06/08 1200) Temp Source: Oral (06/08 0800) BP: 113/71 (06/08 1200) Pulse Rate: 81 (06/08 1200)  Labs: Recent Labs    04/28/23 1130 04/28/23 1347 04/29/23 0015 04/29/23 0504 04/29/23 0507 04/29/23 1101  HGB 12.8*  --   --   --  11.6*  --   HCT 39.8  --   --   --  35.3*  --   PLT 255  --   --   --  182  --   APTT  --   --   --   --  51*  --   LABPROT  --   --   --   --  14.8  --   INR  --   --   --   --  1.1  --   HEPARINUNFRC  --   --  0.16*  --   --  0.11*  CREATININE 1.21  --   --   --  0.93  --   TROPONINIHS 54* 234*  --  23,432*  --   --      Estimated Creatinine Clearance: 87.8 mL/min (by C-G formula based on SCr of 0.93 mg/dL).   Medical History: Past Medical History:  Diagnosis Date   Kidney stones     Medications:  Scheduled:   [START ON 04/30/2023] amLODipine  5 mg Oral Daily   aspirin EC  81 mg Oral Daily   atorvastatin  40 mg Oral Daily   Chlorhexidine Gluconate Cloth  6 each Topical Daily   folic acid  1 mg Intravenous Daily   insulin aspart  0-15 Units Subcutaneous Q4H   insulin detemir  5 Units Subcutaneous QHS   metoprolol tartrate  25 mg Oral BID   sodium chloride flush  3 mL Intravenous Q12H   thiamine (VITAMIN B1) injection  100 mg Intravenous Daily   Infusions:   azithromycin 500 mg (04/29/23 1011)   cefTRIAXone (ROCEPHIN)  IV 2 g (04/29/23 1153)   heparin 1,400 Units/hr (04/29/23 1155)   PRN: acetaminophen **OR** acetaminophen, fentaNYL (SUBLIMAZE) injection, ipratropium, metoprolol tartrate, ondansetron **OR** ondansetron (ZOFRAN) IV, oxyCODONE, polyethylene glycol  Assessment: Ronnie Mann is a 67 y.o. male presenting with chest pain. PMH  significant for T2DM, HTN. Patient was not on [facility] PTA per chart review. Pharmacy has been consulted to initiate and manage heparin infusion.   Baseline Labs: Hgb 12.8, Hct 39.8, Plt 255  Baseline aPTT, PT, INR not taken prior to the start of the infusion  Goal of Therapy:  Heparin level 0.3-0.7 units/ml Monitor platelets by anticoagulation protocol: Yes   Date Time HL Rate/Comment  6/8 0015 0.16 1100/subtherapeutic  6/8 1101 0.11 1100/subtherapeutic 6/8 1757 0.31 1400/therapeutic x1  Plan:  Continue heparin infusion at 1400 units/hr Check HL in 6 hours  Continue to monitor H&H and platelets daily while on heparin infusion   Celene Squibb, PharmD PGY1 Pharmacy Resident 04/29/2023 6:12 PM

## 2023-04-29 NOTE — Progress Notes (Signed)
Progress Note   Patient: Ronnie Mann JXB:[id number] DOB: 1956-07-25 DOA: 04/28/2023     1 DOS: the patient was seen and examined on 04/29/2023   Brief hospital course: ADEL BURCH is a 67 y.o. male with medical history significant of  nephrolithiasis requiring multiple surgeries and lithotripsies in the past.  He reports right-sided kidney pain that has been ongoing and related to that for a long time.  He also reports URI congestion and runny nose for the last week prior to admission.  He reports new onset of substernal "vice like" substernal chest pain that radiated to his left arm.  He reports ongoing shortness of breath associated with this.  He also was awoken from sleep with this last night and had broken out into a cold sweat.  In the ED he was noted to have an elevated lactic acid and elevated WBC at 14.4.  He was given a liter of IV fluids.  Chest x-ray was done and showed pulmonary edema and his BNP said came back at 530.  He was not given further IV fluid boluses.  His initial troponin was 54 rose to 234.  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  Additionally patient was noted to have a CBG of 298 and did report polyuria and polydipsia at home.    Assessment and Plan:  * CHF (congestive heart failure) (HCC) Unclear etiology.  2D echocardiogram results pending New onset with elevated BNP and pulm edema on chest x-ray Appreciate cardiology input Will give a one-time dose of Lasix 20 mg IV    Acute non-ST elevation MI Patient presented for evaluation of chest pain over the left anterior chest wall with radiation to the left arm Has an uptrending troponin level Currently chest pain-free Continue Heparin drip,aspirin,  statins and metoprolol    History of nephrolithiasis/left hydronephrosis/staghorn calculi Sepsis from a urinary source Patient noted to have significant pyuria, tachycardia, tachypnea, lactic acidosis and leukocytosis Continue empiric antibiotic therapy with Rocephin Follow-up results of urine culture Appreciate urology input, plan is for ureteroscopy and stent placement after his cardiac intervention planned for 05/01/23   Community-acquired pneumonia Continue Rocephin and Zithromax     Uncontrolled type 2 diabetes mellitus with hyperglycemia, without long-term current use of insulin (HCC) New onset with random CBG of 298 today.   He does report polyuria and polydipsia at home Maintain consistent carbohydrate diet Glycemic control with sliding scale insulin Diabetes education Follow-up results of hemoglobin A1c        Subjective: Patient is seen and examined at the bedside.  Chest pain has improved  Physical Exam: Vitals:   04/29/23 0800 04/29/23 1000 04/29/23 1100 04/29/23 1200  BP: 111/65 103/66 108/77 113/71  Pulse: 84 86 81 81  Resp: 17 20    Temp: 98 F (36.7 C)   97.8 F (36.6 C)  TempSrc: Oral     SpO2: 95% 96% 96% 96%  Weight:      Height:       General appearance - in mild to moderate distress and chronically ill-appearing Neck - supple, no significant adenopathy Chest -scattered wheezes Heart - regular rate and rhythm S1-S2 Back-right CVA tenderness Abdomen - soft, nontender, nondistended, no masses or organomegaly Extremities -trace  pedal edema noted Skin -petechiae noted about the ankles   Data Reviewed: Labs reviewed.  Troponin peaked at 24,000 There are no new results to review at this time.  Family Communication: Greater than 50% of time was spent discussing patient's condition and plan of care with him at the bedside.  All questions and concerns have been addressed.  He verbalizes understanding and agrees with the  plan.  Disposition: Status is: Inpatient Remains inpatient appropriate because: Treatment for non-ST elevation MI  Planned Discharge Destination: Home    Time spent: 40 minutes  Author: Lucile Shutters, MD 04/29/2023 3:26 PM  For on call review [email or web address].

## 2023-04-30 ENCOUNTER — Inpatient Hospital Stay (HOSPITAL_COMMUNITY)
Admit: 2023-04-30 | Discharge: 2023-04-30 | Disposition: A | Discharge status: Home / Self Care | Payer: Medicare HMO | Attending: Family Medicine | Admitting: Family Medicine

## 2023-04-30 DIAGNOSIS — I5021 Acute systolic (congestive) heart failure: Secondary | ICD-10-CM | POA: Diagnosis not present

## 2023-04-30 DIAGNOSIS — R079 Chest pain, unspecified: Secondary | ICD-10-CM | POA: Diagnosis not present

## 2023-04-30 LAB — BASIC METABOLIC PANEL
Anion gap: 10 (ref 5–15)
BUN: 27 mg/dL — ABNORMAL HIGH (ref 8–23)
CO2: 24 mmol/L (ref 22–32)
Calcium: 8.6 mg/dL — ABNORMAL LOW (ref 8.9–10.3)
Chloride: 108 mmol/L (ref 98–111)
Creatinine, Ser: 0.96 mg/dL (ref 0.61–1.24)
GFR, Estimated: >60 mL/min (ref >60)
Glucose, Bld: 124 mg/dL — ABNORMAL HIGH (ref 70–99)
Potassium: 4.2 mmol/L (ref 3.5–5.1)
Sodium: 142 mmol/L (ref 135–145)

## 2023-04-30 LAB — ECHOCARDIOGRAM COMPLETE
AR max vel: 2.26 cm2
AV Area VTI: 1.91 cm2
AV Area mean vel: 2.15 cm2
AV Mean grad: 3 mmHg
AV Peak grad: 4.9 mmHg
Ao pk vel: 1.11 m/s
Area-P 1/2: 5.13 cm2
Calc EF: 35.9 %
Height: 69 in
MV M vel: 5.24 m/s
MV Peak grad: 109.8 mmHg
MV VTI: 1.33 cm2
Radius: 0.6 cm
S' Lateral: 5.4 cm
Single Plane A2C EF: 24.1 %
Single Plane A4C EF: 47.6 %
Weight: 3360 oz

## 2023-04-30 LAB — PHOSPHORUS: Phosphorus: 2.7 mg/dL (ref 2.5–4.6)

## 2023-04-30 LAB — MAGNESIUM: Magnesium: 2 mg/dL (ref 1.7–2.4)

## 2023-04-30 LAB — CBC
HCT: 31.6 % — ABNORMAL LOW (ref 39.0–52.0)
Hemoglobin: 10.5 g/dL — ABNORMAL LOW (ref 13.0–17.0)
MCH: 32.5 pg (ref 26.0–34.0)
MCHC: 33.2 g/dL (ref 30.0–36.0)
MCV: 97.8 fL (ref 80.0–100.0)
Platelets: 164 10*3/uL (ref 150–400)
RBC: 3.23 MIL/uL — ABNORMAL LOW (ref 4.22–5.81)
RDW: 13.7 % (ref 11.5–15.5)
WBC: 7.6 10*3/uL (ref 4.0–10.5)
nRBC: 0 % (ref 0.0–0.2)

## 2023-04-30 LAB — GLUCOSE, CAPILLARY
Glucose-Capillary: 119 mg/dL — ABNORMAL HIGH (ref 70–99)
Glucose-Capillary: 121 mg/dL — ABNORMAL HIGH (ref 70–99)
Glucose-Capillary: 101 mg/dL — ABNORMAL HIGH (ref 70–99)
Glucose-Capillary: 132 mg/dL — ABNORMAL HIGH (ref 70–99)
Glucose-Capillary: 87 mg/dL (ref 70–99)
Glucose-Capillary: 122 mg/dL — ABNORMAL HIGH (ref 70–99)

## 2023-04-30 LAB — HEPARIN LEVEL (UNFRACTIONATED)
Heparin Unfractionated: 0.23 IU/mL — ABNORMAL LOW (ref 0.30–0.70)
Heparin Unfractionated: 0.41 IU/mL (ref 0.30–0.70)
Heparin Unfractionated: 0.43 IU/mL (ref 0.30–0.70)

## 2023-04-30 LAB — CULTURE, BLOOD (ROUTINE X 2)

## 2023-04-30 MED ORDER — PERFLUTREN LIPID MICROSPHERE
1.0000 mL | INTRAVENOUS | Status: AC | PRN
  Administered 2023-04-30: 3 mL via INTRAVENOUS

## 2023-04-30 MED ORDER — SODIUM CHLORIDE 0.9 % IV SOLN: INTRAVENOUS | Status: DC

## 2023-04-30 MED ORDER — ASPIRIN 81 MG PO CHEW
81.0000 mg | CHEWABLE_TABLET | ORAL | Status: AC
  Filled 2023-04-30: qty 1

## 2023-04-30 MED ORDER — SODIUM CHLORIDE 0.9% FLUSH
3.0000 mL | Freq: Two times a day (BID) | INTRAVENOUS | Status: DC
  Administered 2023-04-30 – 2023-05-06 (×9): 3 mL via INTRAVENOUS

## 2023-04-30 MED ORDER — SODIUM CHLORIDE 0.9 % WEIGHT BASED INFUSION
1.0000 mL/kg/h | INTRAVENOUS | Status: DC
  Administered 2023-05-01: 1 mL/kg/h via INTRAVENOUS

## 2023-04-30 MED ORDER — HEPARIN BOLUS VIA INFUSION
1350.0000 [IU] | Freq: Once | INTRAVENOUS | Status: AC
  Administered 2023-04-30: 1350 [IU] via INTRAVENOUS
  Filled 2023-04-30: qty 1350

## 2023-04-30 MED ORDER — SODIUM CHLORIDE 0.9 % IV SOLN: 250.0000 mL | INTRAVENOUS | Status: DC | PRN

## 2023-04-30 MED ORDER — LIVING WELL WITH DIABETES BOOK
Freq: Once | Status: AC
  Filled 2023-04-30: qty 1

## 2023-04-30 MED ORDER — ASPIRIN 81 MG PO CHEW
81.0000 mg | CHEWABLE_TABLET | ORAL | Status: AC
  Administered 2023-05-01: 81 mg via ORAL

## 2023-04-30 MED ORDER — SODIUM CHLORIDE 0.9 % WEIGHT BASED INFUSION
3.0000 mL/kg/h | INTRAVENOUS | Status: DC
  Administered 2023-05-01: 3 mL/kg/h via INTRAVENOUS

## 2023-04-30 MED ORDER — SODIUM CHLORIDE 0.9% FLUSH: 3.0000 mL | INTRAVENOUS | Status: DC | PRN

## 2023-04-30 MED ORDER — FUROSEMIDE 10 MG/ML IJ SOLN
20.0000 mg | Freq: Once | INTRAMUSCULAR | Status: AC
  Administered 2023-04-30: 20 mg via INTRAVENOUS
  Filled 2023-04-30: qty 2

## 2023-04-30 MED ORDER — SODIUM CHLORIDE 0.9% FLUSH
3.0000 mL | Freq: Two times a day (BID) | INTRAVENOUS | Status: DC
  Administered 2023-04-30 (×2): 3 mL via INTRAVENOUS

## 2023-04-30 NOTE — Progress Notes (Addendum)
04/29/23 1910 patient alert x4 able to make all needs known on 2l [place] up in chair   04/29/23 2000 patient back in bed   04/29/23 2300 patient pulled IV out on accident it was already leaking  04/30/23 0000 new IV placed for ABT, increase to heparin gtt with bolus   0545 patient had V-RUN bp 116/63 80 patient noted to have back pain having back pain all night

## 2023-04-30 NOTE — Consult Note (Signed)
ANTICOAGULATION CONSULT NOTE  Pharmacy Consult for Heparin Infusion  Indication: chest pain/ACS  No Known Allergies  Patient Measurements: Height: 5\' 9"  (175.3 cm) Weight: 95.3 kg (210 lb) IBW/kg (Calculated) : 70.7 Heparin Dosing Weight: 90.4 kg  Vital Signs: Temp: 97.7 F (36.5 C) (06/09 0357) Temp Source: Oral (06/09 0357) BP: 92/54 (06/09 0600) Pulse Rate: 75 (06/09 0600)  Labs: Recent Labs    04/28/23 1130 04/28/23 1347 04/29/23 0015 04/29/23 0504 04/29/23 0507 04/29/23 1101 04/29/23 1757 04/29/23 2354 04/30/23 0615  HGB 12.8*  --   --   --  11.6*  --   --   --  10.5*  HCT 39.8  --   --   --  35.3*  --   --   --  31.6*  PLT 255  --   --   --  182  --   --   --  164  APTT  --   --   --   --  51*  --   --   --   --   LABPROT  --   --   --   --  14.8  --   --   --   --   INR  --   --   --   --  1.1  --   --   --   --   HEPARINUNFRC  --   --    < >  --   --    < > 0.31 0.23* 0.43  CREATININE 1.21  --   --   --  0.93  --   --   --   --   TROPONINIHS 54* 234*  --  23,432*  --   --   --   --   --    < > = values in this interval not displayed.     Estimated Creatinine Clearance: 87.8 mL/min (by C-G formula based on SCr of 0.93 mg/dL).   Medical History: Past Medical History:  Diagnosis Date   Kidney stones     Medications:  Scheduled:   amLODipine  5 mg Oral Daily   aspirin EC  81 mg Oral Daily   atorvastatin  40 mg Oral Daily   Chlorhexidine Gluconate Cloth  6 each Topical Daily   folic acid  1 mg Intravenous Daily   insulin aspart  0-15 Units Subcutaneous Q4H   insulin detemir  5 Units Subcutaneous QHS   metoprolol tartrate  25 mg Oral BID   sodium chloride flush  3 mL Intravenous Q12H   thiamine (VITAMIN B1) injection  100 mg Intravenous Daily   Infusions:   azithromycin Stopped (04/29/23 1111)   cefTRIAXone (ROCEPHIN)  IV Stopped (04/29/23 1223)   heparin 1,600 Units/hr (04/30/23 0600)   PRN: acetaminophen **OR** acetaminophen, fentaNYL  (SUBLIMAZE) injection, ipratropium, metoprolol tartrate, ondansetron **OR** ondansetron (ZOFRAN) IV, oxyCODONE, polyethylene glycol  Assessment: Ronnie Mann is a 67 y.o. male presenting with chest pain. PMH significant for T2DM, HTN. Patient was not on [facility] PTA per chart review. Pharmacy has been consulted to initiate and manage heparin infusion.   Baseline Labs: Hgb 12.8, Hct 39.8, Plt 255  Baseline aPTT, PT, INR not taken prior to the start of the infusion  Goal of Therapy:  Heparin level 0.3-0.7 units/ml Monitor platelets by anticoagulation protocol: Yes   Date Time HL Rate/Comment  6/8 0015 0.16 1100/subtherapeutic  6/8 1101 0.11 1100/subtherapeutic 6/8 1757 0.31 1400/therapeutic x1 6/8  2354    0.23    1400/SUBtherapeutic 6/9       0615    0.43    1600/Therapeutic X 1   Plan:  6/9:  HL @ 0615 = 0.43, therapeutic X 1 - Will recheck HL in 6 hrs on 6/9 @ 1200.  Continue to monitor H&H and platelets daily while on heparin infusion   Adayah Arocho D 04/30/2023 6:52 AM

## 2023-04-30 NOTE — H&P (View-Only) (Signed)
 Rounding Note    Patient Name: Ronnie Mann Date of Encounter: 04/30/2023  [facility] Cardiologist: [facility]  Subjective   Improving right flank and back pain, still mild, positional, worse when lying back Poor sleep last night, anxious Denies significant left chest left arm pain Significant increase in troponin from 200 now 23,000 Continues on heparin infusion with plan for catheterization tomorrow  Continues to have thick purulent appearing urine Reports discomfort has moved,  now more in the suprapubic area, less in the flank  Inpatient Medications    Scheduled Meds:  amLODipine  5 mg Oral Daily   [START ON 05/01/2023] aspirin  81 mg Oral Pre-Cath   aspirin EC  81 mg Oral Daily   atorvastatin  40 mg Oral Daily   Chlorhexidine Gluconate Cloth  6 each Topical Daily   folic acid  1 mg Intravenous Daily   insulin aspart  0-15 Units Subcutaneous Q4H   insulin detemir  5 Units Subcutaneous QHS   metoprolol tartrate  25 mg Oral BID   sodium chloride flush  3 mL Intravenous Q12H   sodium chloride flush  3 mL Intravenous Q12H   thiamine (VITAMIN B1) injection  100 mg Intravenous Daily   Continuous Infusions:  sodium chloride     [START ON 05/01/2023] sodium chloride     Followed by   [START ON 05/01/2023] sodium chloride     azithromycin 500 mg (04/30/23 1027)   cefTRIAXone (ROCEPHIN)  IV Stopped (04/29/23 1223)   heparin 1,600 Units/hr (04/30/23 0700)   PRN Meds: sodium chloride, acetaminophen **OR** acetaminophen, fentaNYL (SUBLIMAZE) injection, ipratropium, metoprolol tartrate, ondansetron **OR** ondansetron (ZOFRAN) IV, oxyCODONE, polyethylene glycol, sodium chloride flush   Vital Signs    Vitals:   04/30/23 0700 04/30/23 0800 04/30/23 0900 04/30/23 1019  BP: 122/75 133/73 (!) 110/58 127/76  Pulse: 80 77 64 95  Resp:  20 15   Temp:  98.9 F (37.2 C)    TempSrc:  Oral    SpO2: 98% 95% 94%   Weight:      Height:        Intake/Output Summary (Last 24  hours) at 04/30/2023 1114 Last data filed at 04/30/2023 1033 Gross per 24 hour  Intake 812.69 ml  Output 3850 ml  Net -3037.31 ml      04/28/2023   11:12 AM 07/01/2020    4:02 PM  Last 3 Weights  Weight (lbs) 210 lb 230 lb  Weight (kg) 95.255 kg 104.327 kg      Telemetry    Normal sinus rhythm- Personally Reviewed  ECG     - Personally Reviewed  Physical Exam   Constitutional:  oriented to person, place, and time. No distress.  HENT:  Head: Grossly normal Eyes:  no discharge. No scleral icterus.  Neck: No JVD, no carotid bruits  Cardiovascular: Regular rate and rhythm, no murmurs appreciated Pulmonary/Chest: Clear to auscultation bilaterally, no wheezes or rails Abdominal: Soft.  no distension.  no tenderness.  Musculoskeletal: Normal range of motion Neurological:  normal muscle tone. Coordination normal. No atrophy Skin: Skin warm and dry Psychiatric: normal affect, pleasant   Labs    High Sensitivity Troponin:   Recent Labs  Lab 04/28/23 1130 04/28/23 1347 04/29/23 0504  TROPONINIHS 54* 234* 23,432*     Chemistry Recent Labs  Lab 04/28/23 1130 04/29/23 0507 04/30/23 0615  NA 138 139  --   K 4.1 4.4  --   CL 104 108  --   CO2   22 22  --   GLUCOSE 293* 146*  --   BUN 30* 27*  --   CREATININE 1.21 0.93  --   CALCIUM 9.8 8.8*  --   MG  --   --  2.0  PROT 7.7 7.1  --   ALBUMIN 4.2 4.1  --   AST 32 122*  --   ALT 22 30  --   ALKPHOS 101 93  --   BILITOT 0.8 1.1  --   GFRNONAA >60 >60  --   ANIONGAP 12 9  --     Lipids No results for input(s): "CHOL", "TRIG", "HDL", "LABVLDL", "LDLCALC", "CHOLHDL" in the last 168 hours.  Hematology Recent Labs  Lab 04/28/23 1130 04/29/23 0507 04/30/23 0615  WBC 14.4* 10.9* 7.6  RBC 3.98* 3.58* 3.23*  HGB 12.8* 11.6* 10.5*  HCT 39.8 35.3* 31.6*  MCV 100.0 98.6 97.8  MCH 32.2 32.4 32.5  MCHC 32.2 32.9 33.2  RDW 13.3 13.5 13.7  PLT 255 182 164   Thyroid  Recent Labs  Lab 04/28/23 1130  TSH 4.483     BNP Recent Labs  Lab 04/28/23 1130  BNP 530.9*    DDimer No results for input(s): "DDIMER" in the last 168 hours.   Radiology    CT Renal Stone Study  Result Date: 04/28/2023 CLINICAL DATA:  CTA of the chest performed for left-sided chest pain demonstrated evidence of a staghorn calculus in the visualized upper right kidney and evidence of probable significant hydronephrosis of the visualized upper left kidney. EXAM: CT ABDOMEN AND PELVIS WITHOUT CONTRAST TECHNIQUE: Multidetector CT imaging of the abdomen and pelvis was performed following the standard protocol without IV contrast. RADIATION DOSE REDUCTION: This exam was performed according to the departmental dose-optimization program which includes automated exposure control, adjustment of the mA and/or kV according to patient size and/or use of iterative reconstruction technique. COMPARISON:  CT a of the chest earlier today. Prior CT of the abdomen and pelvis without contrast on 07/23/2011. FINDINGS: Lower chest: Scattered scarring and atelectasis at both lung bases. Large hiatal hernia. Hepatobiliary: The liver does not appear overtly cirrhotic by abdominal CT. At least 1 gallstone is identified in the gallbladder lumen. Gallbladder is decompressed with potentially some fundal wall thickening which may implicate some chronic inflammation. No biliary ductal dilatation. Pancreas: Unremarkable. No pancreatic ductal dilatation or surrounding inflammatory changes. Spleen: Normal in size without focal abnormality. Adrenals/Urinary Tract: Evaluation of the kidneys is somewhat challenging due to the administration of IV contrast for CTA of the chest over 1 hour prior to the abdominal and pelvic CT. There is evidence of a staghorn calculus within the upper pole collecting system of the right kidney as well as an additional calculus in the lower pole. Some excretion of contrast is present surrounding calculi as well as in the interpolar kidney and contrast is  also seen in the right ureter. The right renal pelvis appears thickened and stenotic. This also appeared thickened in 2012 and may reflect chronic inflammation. Ultimately correlation with ureteroscopy may be beneficial in evaluation for potential underlying urothelial neoplasm. The left kidney demonstrates significant hydronephrosis with some partial excretion of contrast in the lower pole, a partial staghorn calculus in the lower pole measuring up to approximately 2.3 cm in greatest diameter and under obstructing calculus in the ureter at the juncture of proximal and mid thirds measuring 16 mm in height and 8 x 8 mm in transverse dimensions. Cortical thinning of the left kidney implicates a   chronic obstruction. The bladder is filled with excreted contrast and is unremarkable in appearance. Stomach/Bowel: Bowel shows no evidence of obstruction, ileus, inflammation or lesion. The appendix is normal. No free intraperitoneal air. Vascular/Lymphatic: Atherosclerosis of the abdominal aorta without evidence of aneurysm. No lymphadenopathy identified. Reproductive: Prostate is unremarkable. Other: Small left inguinal hernia containing fat. No abdominopelvic ascites. Musculoskeletal: No acute or significant osseous findings. IMPRESSION: 1. 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 Partial staghorn calculus of the lower pole of the left kidney. 2. There is evidence of significant hydronephrosis of the left kidney with cortical thinning and an obstructing calculus in the ureter at the juncture of proximal and mid thirds measuring 16 mm in height and 8 x 8 mm in transverse dimensions. Cortical thinning implicates a chronic obstruction. 3. The right renal pelvis appears thickened and stenotic. This also appeared thickened in 2012 and may reflect chronic inflammation. Ultimately correlation with ureteroscopy may be  beneficial in evaluation for potential underlying urothelial neoplasm. 4. Cholelithiasis with decompressed gallbladder and potentially some fundal wall thickening which may implicate some chronic inflammation. 5. Large hiatal hernia. 6. Small left inguinal hernia containing fat. 7. Aortic atherosclerosis. Aortic Atherosclerosis (ICD10-I70.0). Electronically Signed   By: Glenn  Yamagata M.D.   On: 04/28/2023 14:54   CT Angio Chest PE W/Cm &/Or Wo Cm  Result Date: 04/28/2023 CLINICAL DATA:  Clinical concern for pulmonary embolism. EXAM: CT ANGIOGRAPHY CHEST WITH CONTRAST TECHNIQUE: Multidetector CT imaging of the chest was performed using the standard protocol during bolus administration of intravenous contrast. Multiplanar CT image reconstructions and MIPs were obtained to evaluate the vascular anatomy. RADIATION DOSE REDUCTION: This exam was performed according to the departmental dose-optimization program which includes automated exposure control, adjustment of the mA and/or kV according to patient size and/or use of iterative reconstruction technique. CONTRAST:  75mL OMNIPAQUE IOHEXOL 350 MG/ML SOLN COMPARISON:  None Available. FINDINGS: Cardiovascular: Heart is enlarged. No substantial pericardial effusion. Ascending thoracic aorta measures up to 4.2 cm diameter. Enlargement of the pulmonary outflow tract/main pulmonary arteries suggests pulmonary arterial hypertension. There is no filling defect within the opacified pulmonary arteries to suggest the presence of an acute pulmonary embolus. Mediastinum/Nodes: Scattered upper normal mediastinal lymph nodes identified with 14 mm short axis precarinal node on 52/4. There is no hilar lymphadenopathy. Large hiatal hernia. The esophagus has normal imaging features. There is no axillary lymphadenopathy. Lungs/Pleura: Diffuse interlobular septal thickening evident with scattered areas of peripheral in central nodular/patchy ground-glass opacity. 19 mm ground-glass  nodule identified superior segment right lower lobe on 65/5. No dense focal airspace consolidation. Trace bilateral pleural effusions. Upper Abdomen: Nodular liver contour raises concern for cirrhosis. 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 Left kidney incompletely visualized but demonstrates hydronephrosis with overlying cortical thinning. Exophytic lesion upper pole right kidney measures 2.8 cm in approaches water density but has been incompletely visualized. 19 mm left adrenal adenoma. No followup imaging is recommended. Musculoskeletal: No worrisome lytic or sclerotic osseous abnormality. Review of the MIP images confirms the above findings. IMPRESSION: 1. No CT evidence for acute pulmonary embolus. 2. Diffuse interlobular septal thickening with scattered areas of peripheral and central nodular/patchy ground-glass opacity. Imaging features are most suggestive of pulmonary edema. 3. 19 mm ground-glass nodule superior segment right lower lobe. Potentially related to the edema. Non-contrast chest CT at 3-6 months is recommended.   If the ground glass opacities persist, subsequent management will be based upon the most suspicious findings. This recommendation follows the consensus statement: Guidelines for Management of Incidental Pulmonary Nodules Detected on CT Images: From the Fleischner Society 2017; Radiology 2017; 281: 228-243. 4. Trace bilateral pleural effusions. 5. Nodular liver contour raises concern for cirrhosis. 6. 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 Left kidney incompletely visualized but demonstrates hydronephrosis with overlying cortical thinning. Urology consultation recommended. 7. 19 mm left adrenal adenoma. No followup imaging is recommended. Electronically Signed   By: Eric  Mansell M.D.   On: 04/28/2023 13:19   DG Chest 1 View  Result Date: 04/28/2023 CLINICAL DATA:   Shortness of breath and chest pain. EXAM: CHEST  1 VIEW COMPARISON:  06/01/2009 FINDINGS: Stable top-normal heart size and visible large hiatal hernia. Diffuse pulmonary edema most likely reflects congestive heart failure. No significant pleural fluid identified at by frontal chest radiograph. No pneumothorax or focal airspace consolidation. IMPRESSION: Diffuse pulmonary edema most likely reflecting congestive heart failure. Large hiatal hernia. Electronically Signed   By: Glenn  Yamagata M.D.   On: 04/28/2023 12:16    Cardiac Studies     Patient Profile     Mr. Cortland Bluitt is a 67-year-old gentleman with long history of kidney stones, presenting April 28 2023 to the emergency room with left-sided chest pain radiating to left arm, chronic shortness of breath for more than 1 month with chest congestion   Assessment & Plan    Non-STEMI Presenting with left chest, left arm pain Initial troponin 50, repeat 240, up to 23,000 Started on heparin infusion yesterday Echocardiogram pending Plan for cardiac catheterization Monday if stable from a urologic perspective I have reviewed the risks, indications, and alternatives to cardiac catheterization, possible angioplasty, and stenting with the patient. Risks include but are not limited to bleeding, infection, vascular injury, stroke, myocardial infection, arrhythmia, kidney injury, radiation-related injury in the case of prolonged fluoroscopy use, emergency cardiac surgery, and death. The patient understands the risks of serious complication is 1-2 in 1000 with diagnostic cardiac cath and 1-2% or less with angioplasty/stenting.   Sinus tachycardia Continue metoprolol tartrate 25 twice daily   Essential hypertension Continue metoprolol tartrate 25 twice daily amlodipine  5 mg daily with hold parameters   Kidney stones with hydronephrosis/urinary tract infection Evaluated by urology, Left proximal ureteral obstruction with pain, Bilateral renal stone .   On antibiotics for UTI, purulent looking urine Slow improvement in pain Plan to consider stent following cardiac catheterization  Shortness of breath, respiratory distress COVID-negative, respiratory panel negative CT scan with groundglass opacities noted concerning for infection  on Z-Pak and ceftriaxone Symptoms improving    Total encounter time more than 50 minutes  Greater than 50% was spent in counseling and coordination of care with the patient   For questions or updates, please contact [facility] Please consult [email or web address] for contact info under        Signed, Keelyn Fjelstad, MD  04/30/2023, 11:14 AM    

## 2023-04-30 NOTE — Progress Notes (Signed)
Rounding Note    Patient Name: Ronnie Mann Date of Encounter: 04/30/2023  [facility] Cardiologist: [facility]  Subjective   Improving right flank and back pain, still mild, positional, worse when lying back Poor sleep last night, anxious Denies significant left chest left arm pain Significant increase in troponin from 200 now 23,000 Continues on heparin infusion with plan for catheterization tomorrow  Continues to have thick purulent appearing urine Reports discomfort has moved,  now more in the suprapubic area, less in the flank  Inpatient Medications    Scheduled Meds:  amLODipine  5 mg Oral Daily   [START ON 05/01/2023] aspirin  81 mg Oral Pre-Cath   aspirin EC  81 mg Oral Daily   atorvastatin  40 mg Oral Daily   Chlorhexidine Gluconate Cloth  6 each Topical Daily   folic acid  1 mg Intravenous Daily   insulin aspart  0-15 Units Subcutaneous Q4H   insulin detemir  5 Units Subcutaneous QHS   metoprolol tartrate  25 mg Oral BID   sodium chloride flush  3 mL Intravenous Q12H   sodium chloride flush  3 mL Intravenous Q12H   thiamine (VITAMIN B1) injection  100 mg Intravenous Daily   Continuous Infusions:  sodium chloride     [START ON 05/01/2023] sodium chloride     Followed by   Melene Muller ON 05/01/2023] sodium chloride     azithromycin 500 mg (04/30/23 1027)   cefTRIAXone (ROCEPHIN)  IV Stopped (04/29/23 1223)   heparin 1,600 Units/hr (04/30/23 0700)   PRN Meds: sodium chloride, acetaminophen **OR** acetaminophen, fentaNYL (SUBLIMAZE) injection, ipratropium, metoprolol tartrate, ondansetron **OR** ondansetron (ZOFRAN) IV, oxyCODONE, polyethylene glycol, sodium chloride flush   Vital Signs    Vitals:   04/30/23 0700 04/30/23 0800 04/30/23 0900 04/30/23 1019  BP: 122/75 133/73 (!) 110/58 127/76  Pulse: 80 77 64 95  Resp:  20 15   Temp:  98.9 F (37.2 C)    TempSrc:  Oral    SpO2: 98% 95% 94%   Weight:      Height:        Intake/Output Summary (Last 24  hours) at 04/30/2023 1114 Last data filed at 04/30/2023 1033 Gross per 24 hour  Intake 812.69 ml  Output 3850 ml  Net -3037.31 ml      04/28/2023   11:12 AM 07/01/2020    4:02 PM  Last 3 Weights  Weight (lbs) 210 lb 230 lb  Weight (kg) 95.255 kg 104.327 kg      Telemetry    Normal sinus rhythm- Personally Reviewed  ECG     - Personally Reviewed  Physical Exam   Constitutional:  oriented to person, place, and time. No distress.  HENT:  Head: Grossly normal Eyes:  no discharge. No scleral icterus.  Neck: No JVD, no carotid bruits  Cardiovascular: Regular rate and rhythm, no murmurs appreciated Pulmonary/Chest: Clear to auscultation bilaterally, no wheezes or rails Abdominal: Soft.  no distension.  no tenderness.  Musculoskeletal: Normal range of motion Neurological:  normal muscle tone. Coordination normal. No atrophy Skin: Skin warm and dry Psychiatric: normal affect, pleasant   Labs    High Sensitivity Troponin:   Recent Labs  Lab 04/28/23 1130 04/28/23 1347 04/29/23 0504  TROPONINIHS 54* 234* 23,432*     Chemistry Recent Labs  Lab 04/28/23 1130 04/29/23 0507 04/30/23 0615  NA 138 139  --   K 4.1 4.4  --   CL 104 108  --   CO2  22 22  --   GLUCOSE 293* 146*  --   BUN 30* 27*  --   CREATININE 1.21 0.93  --   CALCIUM 9.8 8.8*  --   MG  --   --  2.0  PROT 7.7 7.1  --   ALBUMIN 4.2 4.1  --   AST 32 122*  --   ALT 22 30  --   ALKPHOS 101 93  --   BILITOT 0.8 1.1  --   GFRNONAA >60 >60  --   ANIONGAP 12 9  --     Lipids No results for input(s): "CHOL", "TRIG", "HDL", "LABVLDL", "LDLCALC", "CHOLHDL" in the last 168 hours.  Hematology Recent Labs  Lab 04/28/23 1130 04/29/23 0507 04/30/23 0615  WBC 14.4* 10.9* 7.6  RBC 3.98* 3.58* 3.23*  HGB 12.8* 11.6* 10.5*  HCT 39.8 35.3* 31.6*  MCV 100.0 98.6 97.8  MCH 32.2 32.4 32.5  MCHC 32.2 32.9 33.2  RDW 13.3 13.5 13.7  PLT 255 182 164   Thyroid  Recent Labs  Lab 04/28/23 1130  TSH 4.483     BNP Recent Labs  Lab 04/28/23 1130  BNP 530.9*    DDimer No results for input(s): "DDIMER" in the last 168 hours.   Radiology    CT Renal Stone Study  Result Date: 04/28/2023 CLINICAL DATA:  CTA of the chest performed for left-sided chest pain demonstrated evidence of a staghorn calculus in the visualized upper right kidney and evidence of probable significant hydronephrosis of the visualized upper left kidney. EXAM: CT ABDOMEN AND PELVIS WITHOUT CONTRAST TECHNIQUE: Multidetector CT imaging of the abdomen and pelvis was performed following the standard protocol without IV contrast. RADIATION DOSE REDUCTION: This exam was performed according to the departmental dose-optimization program which includes automated exposure control, adjustment of the mA and/or kV according to patient size and/or use of iterative reconstruction technique. COMPARISON:  CT a of the chest earlier today. Prior CT of the abdomen and pelvis without contrast on 07/23/2011. FINDINGS: Lower chest: Scattered scarring and atelectasis at both lung bases. Large hiatal hernia. Hepatobiliary: The liver does not appear overtly cirrhotic by abdominal CT. At least 1 gallstone is identified in the gallbladder lumen. Gallbladder is decompressed with potentially some fundal wall thickening which may implicate some chronic inflammation. No biliary ductal dilatation. Pancreas: Unremarkable. No pancreatic ductal dilatation or surrounding inflammatory changes. Spleen: Normal in size without focal abnormality. Adrenals/Urinary Tract: Evaluation of the kidneys is somewhat challenging due to the administration of IV contrast for CTA of the chest over 1 hour prior to the abdominal and pelvic CT. There is evidence of a staghorn calculus within the upper pole collecting system of the right kidney as well as an additional calculus in the lower pole. Some excretion of contrast is present surrounding calculi as well as in the interpolar kidney and contrast is  also seen in the right ureter. The right renal pelvis appears thickened and stenotic. This also appeared thickened in 2012 and may reflect chronic inflammation. Ultimately correlation with ureteroscopy may be beneficial in evaluation for potential underlying urothelial neoplasm. The left kidney demonstrates significant hydronephrosis with some partial excretion of contrast in the lower pole, a partial staghorn calculus in the lower pole measuring up to approximately 2.3 cm in greatest diameter and under obstructing calculus in the ureter at the juncture of proximal and mid thirds measuring 16 mm in height and 8 x 8 mm in transverse dimensions. Cortical thinning of the left kidney implicates a  chronic obstruction. The bladder is filled with excreted contrast and is unremarkable in appearance. Stomach/Bowel: Bowel shows no evidence of obstruction, ileus, inflammation or lesion. The appendix is normal. No free intraperitoneal air. Vascular/Lymphatic: Atherosclerosis of the abdominal aorta without evidence of aneurysm. No lymphadenopathy identified. Reproductive: Prostate is unremarkable. Other: Small left inguinal hernia containing fat. No abdominopelvic ascites. Musculoskeletal: No acute or significant osseous findings. IMPRESSION: 1. 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 Partial staghorn calculus of the lower pole of the left kidney. 2. There is evidence of significant hydronephrosis of the left kidney with cortical thinning and an obstructing calculus in the ureter at the juncture of proximal and mid thirds measuring 16 mm in height and 8 x 8 mm in transverse dimensions. Cortical thinning implicates a chronic obstruction. 3. The right renal pelvis appears thickened and stenotic. This also appeared thickened in 2012 and may reflect chronic inflammation. Ultimately correlation with ureteroscopy may be  beneficial in evaluation for potential underlying urothelial neoplasm. 4. Cholelithiasis with decompressed gallbladder and potentially some fundal wall thickening which may implicate some chronic inflammation. 5. Large hiatal hernia. 6. Small left inguinal hernia containing fat. 7. Aortic atherosclerosis. Aortic Atherosclerosis (ICD10-I70.0). Electronically Signed   By: Irish Lack M.D.   On: 04/28/2023 14:54   CT Angio Chest PE W/Cm &/Or Wo Cm  Result Date: 04/28/2023 CLINICAL DATA:  Clinical concern for pulmonary embolism. EXAM: CT ANGIOGRAPHY CHEST WITH CONTRAST TECHNIQUE: Multidetector CT imaging of the chest was performed using the standard protocol during bolus administration of intravenous contrast. Multiplanar CT image reconstructions and MIPs were obtained to evaluate the vascular anatomy. RADIATION DOSE REDUCTION: This exam was performed according to the departmental dose-optimization program which includes automated exposure control, adjustment of the mA and/or kV according to patient size and/or use of iterative reconstruction technique. CONTRAST:  75mL OMNIPAQUE IOHEXOL 350 MG/ML SOLN COMPARISON:  None Available. FINDINGS: Cardiovascular: Heart is enlarged. No substantial pericardial effusion. Ascending thoracic aorta measures up to 4.2 cm diameter. Enlargement of the pulmonary outflow tract/main pulmonary arteries suggests pulmonary arterial hypertension. There is no filling defect within the opacified pulmonary arteries to suggest the presence of an acute pulmonary embolus. Mediastinum/Nodes: Scattered upper normal mediastinal lymph nodes identified with 14 mm short axis precarinal node on 52/4. There is no hilar lymphadenopathy. Large hiatal hernia. The esophagus has normal imaging features. There is no axillary lymphadenopathy. Lungs/Pleura: Diffuse interlobular septal thickening evident with scattered areas of peripheral in central nodular/patchy ground-glass opacity. 19 mm ground-glass  nodule identified superior segment right lower lobe on 65/5. No dense focal airspace consolidation. Trace bilateral pleural effusions. Upper Abdomen: Nodular liver contour raises concern for cirrhosis. 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 Left kidney incompletely visualized but demonstrates hydronephrosis with overlying cortical thinning. Exophytic lesion upper pole right kidney measures 2.8 cm in approaches water density but has been incompletely visualized. 19 mm left adrenal adenoma. No followup imaging is recommended. Musculoskeletal: No worrisome lytic or sclerotic osseous abnormality. Review of the MIP images confirms the above findings. IMPRESSION: 1. No CT evidence for acute pulmonary embolus. 2. Diffuse interlobular septal thickening with scattered areas of peripheral and central nodular/patchy ground-glass opacity. Imaging features are most suggestive of pulmonary edema. 3. 19 mm ground-glass nodule superior segment right lower lobe. Potentially related to the edema. Non-contrast chest CT at 3-6 months is recommended.  If the ground glass opacities persist, subsequent management will be based upon the most suspicious findings. This recommendation follows the consensus statement: Guidelines for Management of Incidental Pulmonary Nodules Detected on CT Images: From the Fleischner Society 2017; Radiology 2017; 281: 228-243. 4. Trace bilateral pleural effusions. 5. Nodular liver contour raises concern for cirrhosis. 6. 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 Left kidney incompletely visualized but demonstrates hydronephrosis with overlying cortical thinning. Urology consultation recommended. 7. 19 mm left adrenal adenoma. No followup imaging is recommended. Electronically Signed   By: Kennith Center M.D.   On: 04/28/2023 13:19   DG Chest 1 View  Result Date: 04/28/2023 CLINICAL DATA:   Shortness of breath and chest pain. EXAM: CHEST  1 VIEW COMPARISON:  06/01/2009 FINDINGS: Stable top-normal heart size and visible large hiatal hernia. Diffuse pulmonary edema most likely reflects congestive heart failure. No significant pleural fluid identified at by frontal chest radiograph. No pneumothorax or focal airspace consolidation. IMPRESSION: Diffuse pulmonary edema most likely reflecting congestive heart failure. Large hiatal hernia. Electronically Signed   By: Irish Lack M.D.   On: 04/28/2023 12:16    Cardiac Studies     Patient Profile     Mr. Ronnie Mann is a 67 year old gentleman with long history of kidney stones, presenting April 28 2023 to the emergency room with left-sided chest pain radiating to left arm, chronic shortness of breath for more than 1 month with chest congestion   Assessment & Plan    Non-STEMI Presenting with left chest, left arm pain Initial troponin 50, repeat 240, up to 23,000 Started on heparin infusion yesterday Echocardiogram pending Plan for cardiac catheterization Monday if stable from a urologic perspective I have reviewed the risks, indications, and alternatives to cardiac catheterization, possible angioplasty, and stenting with the patient. Risks include but are not limited to bleeding, infection, vascular injury, stroke, myocardial infection, arrhythmia, kidney injury, radiation-related injury in the case of prolonged fluoroscopy use, emergency cardiac surgery, and death. The patient understands the risks of serious complication is 1-2 in 1000 with diagnostic cardiac cath and 1-2% or less with angioplasty/stenting.   Sinus tachycardia Continue metoprolol tartrate 25 twice daily   Essential hypertension Continue metoprolol tartrate 25 twice daily amlodipine  5 mg daily with hold parameters   Kidney stones with hydronephrosis/urinary tract infection Evaluated by urology, Left proximal ureteral obstruction with pain, Bilateral renal stone .   On antibiotics for UTI, purulent looking urine Slow improvement in pain Plan to consider stent following cardiac catheterization  Shortness of breath, respiratory distress COVID-negative, respiratory panel negative CT scan with groundglass opacities noted concerning for infection  on Z-Pak and ceftriaxone Symptoms improving    Total encounter time more than 50 minutes  Greater than 50% was spent in counseling and coordination of care with the patient   For questions or updates, please contact [facility] Please consult [email or web address] for contact info under        Signed, Julien Nordmann, MD  04/30/2023, 11:14 AM

## 2023-04-30 NOTE — Consult Note (Signed)
ANTICOAGULATION CONSULT NOTE  Pharmacy Consult for Heparin Infusion  Indication: chest pain/ACS  No Known Allergies  Patient Measurements: Height: 5\' 9"  (175.3 cm) Weight: 95.3 kg (210 lb) IBW/kg (Calculated) : 70.7 Heparin Dosing Weight: 90.4 kg  Vital Signs: Temp: 98.9 F (37.2 C) (06/09 0800) Temp Source: Oral (06/09 0800) BP: 127/76 (06/09 1019) Pulse Rate: 95 (06/09 1019)  Labs: Recent Labs    04/28/23 1130 04/28/23 1347 04/29/23 0015 04/29/23 0504 04/29/23 0507 04/29/23 1101 04/29/23 2354 04/30/23 0615 04/30/23 1154  HGB 12.8*  --   --   --  11.6*  --   --  10.5*  --   HCT 39.8  --   --   --  35.3*  --   --  31.6*  --   PLT 255  --   --   --  182  --   --  164  --   APTT  --   --   --   --  51*  --   --   --   --   LABPROT  --   --   --   --  14.8  --   --   --   --   INR  --   --   --   --  1.1  --   --   --   --   HEPARINUNFRC  --   --    < >  --   --    < > 0.23* 0.43 0.41  CREATININE 1.21  --   --   --  0.93  --   --   --  0.96  TROPONINIHS 54* 234*  --  23,432*  --   --   --   --   --    < > = values in this interval not displayed.     Estimated Creatinine Clearance: 85 mL/min (by C-G formula based on SCr of 0.96 mg/dL).   Medical History: Past Medical History:  Diagnosis Date   Kidney stones     Medications:  Scheduled:   amLODipine  5 mg Oral Daily   [START ON 05/01/2023] aspirin  81 mg Oral Pre-Cath   aspirin EC  81 mg Oral Daily   atorvastatin  40 mg Oral Daily   Chlorhexidine Gluconate Cloth  6 each Topical Daily   folic acid  1 mg Intravenous Daily   insulin aspart  0-15 Units Subcutaneous Q4H   insulin detemir  5 Units Subcutaneous QHS   metoprolol tartrate  25 mg Oral BID   sodium chloride flush  3 mL Intravenous Q12H   sodium chloride flush  3 mL Intravenous Q12H   thiamine (VITAMIN B1) injection  100 mg Intravenous Daily   Infusions:   sodium chloride     [START ON 05/01/2023] sodium chloride     Followed by   Melene Muller ON  05/01/2023] sodium chloride     azithromycin 500 mg (04/30/23 1027)   cefTRIAXone (ROCEPHIN)  IV 2 g (04/30/23 1137)   heparin 1,600 Units/hr (04/30/23 0700)   PRN: sodium chloride, acetaminophen **OR** acetaminophen, fentaNYL (SUBLIMAZE) injection, ipratropium, metoprolol tartrate, ondansetron **OR** ondansetron (ZOFRAN) IV, oxyCODONE, polyethylene glycol, sodium chloride flush  Assessment: Ronnie Mann is a 68 y.o. male presenting with chest pain. PMH significant for T2DM, HTN. Patient was not on [facility] PTA per chart review. Pharmacy has been consulted to initiate and manage heparin infusion.   Baseline Labs: Hgb 12.8, Hct 39.8, Plt  255  Baseline aPTT, PT, INR not taken prior to the start of the infusion  Goal of Therapy:  Heparin level 0.3-0.7 units/ml Monitor platelets by anticoagulation protocol: Yes   Date Time HL Rate/Comment  6/8 0015 0.16 1100/subtherapeutic  6/8 1101 0.11 1100/subtherapeutic 6/8 1757 0.31 1400/therapeutic x1 6/8 2354 0.23 1400/subtherapeutic 6/9 0615 0.43 1600/therapeutic x1  6/9 1154 0.41 1600/therapeutic x2  Plan:  Continue heparin infusion at 1600 units/hr Check HL in daily while on heparin infusion  Continue to monitor H&H and platelets daily while on heparin infusion   Celene Squibb, PharmD PGY1 Pharmacy Resident 04/30/2023 12:20 PM

## 2023-04-30 NOTE — Consult Note (Signed)
ANTICOAGULATION CONSULT NOTE  Pharmacy Consult for Heparin Infusion  Indication: chest pain/ACS  No Known Allergies  Patient Measurements: Height: 5\' 9"  (175.3 cm) Weight: 95.3 kg (210 lb) IBW/kg (Calculated) : 70.7 Heparin Dosing Weight: 90.4 kg  Vital Signs: Temp: 98.2 F (36.8 C) (06/08 1948) Temp Source: Oral (06/09 0000) BP: 144/82 (06/09 0000) Pulse Rate: 108 (06/09 0000)  Labs: Recent Labs    04/28/23 1130 04/28/23 1347 04/29/23 0015 04/29/23 0504 04/29/23 0507 04/29/23 1101 04/29/23 1757 04/29/23 2354  HGB 12.8*  --   --   --  11.6*  --   --   --   HCT 39.8  --   --   --  35.3*  --   --   --   PLT 255  --   --   --  182  --   --   --   APTT  --   --   --   --  51*  --   --   --   LABPROT  --   --   --   --  14.8  --   --   --   INR  --   --   --   --  1.1  --   --   --   HEPARINUNFRC  --   --    < >  --   --  0.11* 0.31 0.23*  CREATININE 1.21  --   --   --  0.93  --   --   --   TROPONINIHS 54* 234*  --  23,432*  --   --   --   --    < > = values in this interval not displayed.     Estimated Creatinine Clearance: 87.8 mL/min (by C-G formula based on SCr of 0.93 mg/dL).   Medical History: Past Medical History:  Diagnosis Date   Kidney stones     Medications:  Scheduled:   amLODipine  5 mg Oral Daily   aspirin EC  81 mg Oral Daily   atorvastatin  40 mg Oral Daily   Chlorhexidine Gluconate Cloth  6 each Topical Daily   folic acid  1 mg Intravenous Daily   heparin  1,350 Units Intravenous Once   insulin aspart  0-15 Units Subcutaneous Q4H   insulin detemir  5 Units Subcutaneous QHS   metoprolol tartrate  25 mg Oral BID   sodium chloride flush  3 mL Intravenous Q12H   thiamine (VITAMIN B1) injection  100 mg Intravenous Daily   Infusions:   azithromycin Stopped (04/29/23 1111)   cefTRIAXone (ROCEPHIN)  IV Stopped (04/29/23 1223)   heparin 1,400 Units/hr (04/29/23 2000)   PRN: acetaminophen **OR** acetaminophen, fentaNYL (SUBLIMAZE) injection,  ipratropium, metoprolol tartrate, ondansetron **OR** ondansetron (ZOFRAN) IV, oxyCODONE, polyethylene glycol  Assessment: Ronnie Mann is a 67 y.o. male presenting with chest pain. PMH significant for T2DM, HTN. Patient was not on [facility] PTA per chart review. Pharmacy has been consulted to initiate and manage heparin infusion.   Baseline Labs: Hgb 12.8, Hct 39.8, Plt 255  Baseline aPTT, PT, INR not taken prior to the start of the infusion  Goal of Therapy:  Heparin level 0.3-0.7 units/ml Monitor platelets by anticoagulation protocol: Yes   Date Time HL Rate/Comment  6/8 0015 0.16 1100/subtherapeutic  6/8 1101 0.11 1100/subtherapeutic 6/8 1757 0.31 1400/therapeutic x1 6/8       2354    0.23    1400/SUBtherapeutic  Plan:  6/8:  HL @ 2354 = 0.23, SUBtherapeutic - Will order heparin 1350 units IV X 1 bolus and increase drip rate to 1600 units/hr. - Will recheck HL 6 hrs after rate change Continue to monitor H&H and platelets daily while on heparin infusion   Hazell Siwik D 04/30/2023 12:24 AM

## 2023-04-30 NOTE — Plan of Care (Signed)
  Problem: Education: Goal: Knowledge of General Education information will improve Description: Including pain rating scale, medication(s)/side effects and non-pharmacologic comfort measures Outcome: Progressing   Problem: Health Behavior/Discharge Planning: Goal: Ability to manage health-related needs will improve Outcome: Progressing   Problem: Clinical Measurements: Goal: Ability to maintain clinical measurements within normal limits will improve Outcome: Progressing Goal: Will remain free from infection Outcome: Not Progressing Goal: Diagnostic test results will improve Outcome: Not Progressing Goal: Respiratory complications will improve Outcome: Progressing Goal: Cardiovascular complication will be avoided Outcome: Not Progressing   Problem: Activity: Goal: Risk for activity intolerance will decrease Outcome: Not Progressing   Problem: Nutrition: Goal: Adequate nutrition will be maintained Outcome: Not Progressing   Problem: Coping: Goal: Level of anxiety will decrease Outcome: Not Progressing   Problem: Elimination: Goal: Will not experience complications related to bowel motility Outcome: Not Progressing Goal: Will not experience complications related to urinary retention Outcome: Not Progressing   Problem: Pain Managment: Goal: General experience of comfort will improve Outcome: Progressing   Problem: Safety: Goal: Ability to remain free from injury will improve Outcome: Progressing   Problem: Education: Goal: Will demonstrate proper wound care and an understanding of methods to prevent future damage Outcome: Not Progressing Goal: Knowledge of disease or condition will improve Outcome: Not Progressing Goal: Knowledge of the prescribed therapeutic regimen will improve Outcome: Not Progressing Goal: Individualized Educational Video(s) Outcome: Not Progressing   Problem: Activity: Goal: Risk for activity intolerance will decrease Outcome: Not  Progressing   Problem: Cardiac: Goal: Will achieve and/or maintain hemodynamic stability Outcome: Not Progressing   Problem: Clinical Measurements: Goal: Postoperative complications will be avoided or minimized Outcome: Not Progressing   Problem: Respiratory: Goal: Respiratory status will improve Outcome: Not Progressing   Problem: Skin Integrity: Goal: Wound healing without signs and symptoms of infection Outcome: Not Progressing Goal: Risk for impaired skin integrity will decrease Outcome: Not Progressing   Problem: Urinary Elimination: Goal: Ability to achieve and maintain adequate renal perfusion and functioning will improve Outcome: Not Progressing

## 2023-04-30 NOTE — Progress Notes (Signed)
Progress Note   Patient: Ronnie Mann:[id number] DOB: 02-Aug-1956 DOA: 04/28/2023     2 DOS: the patient was seen and examined on 04/30/2023   Brief hospital course: Ronnie Mann is a 67 y.o. male with medical history significant of  nephrolithiasis requiring multiple surgeries and lithotripsies in the past.  He reports right-sided kidney pain that has been ongoing and related to that for a long time.  He also reports URI congestion and runny nose for the last week prior to admission.  He reports new onset of substernal "vice like" substernal chest pain that radiated to his left arm.  He reports ongoing shortness of breath associated with this.  He also was awoken from sleep with this last night and had broken out into a cold sweat.  In the ED he was noted to have an elevated lactic acid and elevated WBC at 14.4.  He was given a liter of IV fluids.  Chest x-ray was done and showed pulmonary edema and his BNP said came back at 530.  He was not given further IV fluid boluses.  His initial troponin was 54 rose to 234.  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  Additionally patient was noted to have a CBG of 298 and did report polyuria and polydipsia at home.   Assessment and Plan:  *Acute systolic CHF (congestive heart failure) (HCC) 2D echocardiogram showed an LVEF of 30 to 35% with decreased LV systolic function.  Patient noted to have left ventricular global hypokinesis with severe hypokinesis of the anterior, anteroseptal and apical region with grade 2 diastolic dysfunction. Continue furosemide and metoprolol       Acute non-ST elevation MI Patient presented for evaluation of chest pain over the left anterior chest wall with  radiation to the left arm Noted to have significantly elevated troponin level and 2D echocardiogram shows evidence of regional wall motion abnormality Currently chest pain-free Continue Heparin drip,aspirin, statins and metoprolol For left heart cath in a.m.       History of nephrolithiasis/left hydronephrosis/staghorn calculi Sepsis from a urinary source Patient noted to have significant pyuria, tachycardia, tachypnea, lactic acidosis and leukocytosis Continue empiric antibiotic therapy with Rocephin Follow-up results of urine culture Appreciate urology input, plan is for ureteroscopy and stent placement after his cardiac intervention planned for 05/01/23     Community-acquired pneumonia Continue Rocephin and Zithromax         Uncontrolled type 2 diabetes mellitus with hyperglycemia, without long-term current use of insulin (HCC) New onset with random CBG of 298 today.   He does report polyuria and polydipsia at home Maintain consistent carbohydrate diet Glycemic control with sliding scale insulin Diabetes education Follow-up results of hemoglobin A1c               Subjective: Patient is seen and examined at the bedside.  Continues to complain of flank and groin pain but improved from admission.  Physical Exam: Vitals:   04/30/23 0700 04/30/23 0800 04/30/23 0900 04/30/23 1019  BP: 122/75 133/73 (!) 110/58 127/76  Pulse: 80 77 64 95  Resp:  20 15   Temp:  98.9 F (37.2 C)    TempSrc:  Oral    SpO2: 98% 95% 94%  Weight:      Height:       General appearance - in mild to moderate distress and chronically ill-appearing Neck - supple, no significant adenopathy Chest -scattered wheezes Heart - regular rate and rhythm S1-S2 Back-right CVA tenderness Abdomen - soft, nontender, nondistended, no masses or organomegaly Extremities -trace pedal edema noted Skin -petechiae noted about the ankles  Data Reviewed: Labs reviewed There are no new results to review at  this time.  Family Communication: Greater than 50% of time was spent discussing plan of care with patient at the bedside.  All questions and concerns have been addressed.  He verbalizes understanding and agrees with the plan  Disposition: Status is: Inpatient Remains inpatient appropriate because: Left heart cath in a.m.  Planned Discharge Destination: Home    Time spent: 35 minutes Author: Lucile Shutters, MD 04/30/2023 1:35 PM  For on call review [email or web address].

## 2023-05-01 ENCOUNTER — Other Ambulatory Visit (HOSPITAL_COMMUNITY): Payer: Self-pay

## 2023-05-01 ENCOUNTER — Encounter
Admission: EM | Disposition: A | Discharge status: Home / Self Care | Payer: Self-pay | Source: Home / Self Care | Attending: Internal Medicine

## 2023-05-01 ENCOUNTER — Encounter: Payer: Self-pay | Admitting: Family Medicine

## 2023-05-01 DIAGNOSIS — I2510 Atherosclerotic heart disease of native coronary artery without angina pectoris: Secondary | ICD-10-CM

## 2023-05-01 DIAGNOSIS — I214 Non-ST elevation (NSTEMI) myocardial infarction: Secondary | ICD-10-CM

## 2023-05-01 DIAGNOSIS — I5021 Acute systolic (congestive) heart failure: Secondary | ICD-10-CM

## 2023-05-01 DIAGNOSIS — I255 Ischemic cardiomyopathy: Secondary | ICD-10-CM

## 2023-05-01 DIAGNOSIS — J810 Acute pulmonary edema: Secondary | ICD-10-CM

## 2023-05-01 DIAGNOSIS — I5020 Unspecified systolic (congestive) heart failure: Secondary | ICD-10-CM

## 2023-05-01 HISTORY — DX: Atherosclerotic heart disease of native coronary artery without angina pectoris: I25.10

## 2023-05-01 HISTORY — PX: CORONARY STENT INTERVENTION: CATH118234

## 2023-05-01 HISTORY — DX: Ischemic cardiomyopathy: I25.5

## 2023-05-01 HISTORY — PX: LEFT HEART CATH AND CORONARY ANGIOGRAPHY: CATH118249

## 2023-05-01 HISTORY — DX: Unspecified systolic (congestive) heart failure: I50.20

## 2023-05-01 LAB — CBC
HCT: 31.8 % — ABNORMAL LOW (ref 39.0–52.0)
Hemoglobin: 10.3 g/dL — ABNORMAL LOW (ref 13.0–17.0)
MCH: 32.3 pg (ref 26.0–34.0)
MCHC: 32.4 g/dL (ref 30.0–36.0)
MCV: 99.7 fL (ref 80.0–100.0)
Platelets: 160 10*3/uL (ref 150–400)
RBC: 3.19 MIL/uL — ABNORMAL LOW (ref 4.22–5.81)
RDW: 13.5 % (ref 11.5–15.5)
WBC: 7.3 10*3/uL (ref 4.0–10.5)
nRBC: 0 % (ref 0.0–0.2)

## 2023-05-01 LAB — GLUCOSE, CAPILLARY
Glucose-Capillary: 87 mg/dL (ref 70–99)
Glucose-Capillary: 112 mg/dL — ABNORMAL HIGH (ref 70–99)
Glucose-Capillary: 130 mg/dL — ABNORMAL HIGH (ref 70–99)
Glucose-Capillary: 66 mg/dL — ABNORMAL LOW (ref 70–99)
Glucose-Capillary: 129 mg/dL — ABNORMAL HIGH (ref 70–99)
Glucose-Capillary: 142 mg/dL — ABNORMAL HIGH (ref 70–99)

## 2023-05-01 LAB — CREATININE, SERUM
Creatinine, Ser: 0.95 mg/dL (ref 0.61–1.24)
GFR, Estimated: >60 mL/min (ref >60)

## 2023-05-01 LAB — CARDIAC CATHETERIZATION: Cath EF Quantitative: 25 %

## 2023-05-01 LAB — POCT ACTIVATED CLOTTING TIME
Activated Clotting Time: 391 seconds
Activated Clotting Time: 244 seconds

## 2023-05-01 LAB — URINE CULTURE: Culture: NO GROWTH

## 2023-05-01 LAB — HEPARIN LEVEL (UNFRACTIONATED): Heparin Unfractionated: 0.37 IU/mL (ref 0.30–0.70)

## 2023-05-01 SURGERY — LEFT HEART CATH AND CORONARY ANGIOGRAPHY
Anesthesia: Moderate Sedation

## 2023-05-01 MED ORDER — HEPARIN (PORCINE) IN NACL 2000-0.9 UNIT/L-% IV SOLN
INTRAVENOUS | Status: DC | PRN
  Administered 2023-05-01: 1000 mL

## 2023-05-01 MED ORDER — FUROSEMIDE 10 MG/ML IJ SOLN: 40.0000 mg | Freq: Every day | INTRAMUSCULAR | Status: DC

## 2023-05-01 MED ORDER — FUROSEMIDE 10 MG/ML IJ SOLN
40.0000 mg | Freq: Once | INTRAMUSCULAR | Status: DC
  Filled 2023-05-01: qty 4

## 2023-05-01 MED ORDER — SODIUM CHLORIDE 0.9 % IV SOLN: 250.0000 mL | INTRAVENOUS | Status: DC | PRN

## 2023-05-01 MED ORDER — CARVEDILOL 3.125 MG PO TABS
3.1250 mg | ORAL_TABLET | Freq: Two times a day (BID) | ORAL | Status: DC
  Administered 2023-05-01 – 2023-05-04 (×7): 3.125 mg via ORAL
  Filled 2023-05-01 (×7): qty 1

## 2023-05-01 MED ORDER — SPIRONOLACTONE 12.5 MG HALF TABLET
12.5000 mg | ORAL_TABLET | Freq: Every day | ORAL | Status: DC
  Administered 2023-05-01 – 2023-05-05 (×5): 12.5 mg via ORAL
  Filled 2023-05-01 (×5): qty 1

## 2023-05-01 MED ORDER — IOHEXOL 300 MG/ML  SOLN
INTRAMUSCULAR | Status: DC | PRN
  Administered 2023-05-01: 178 mL

## 2023-05-01 MED ORDER — TICAGRELOR 90 MG PO TABS
ORAL_TABLET | ORAL | Status: AC
  Filled 2023-05-01: qty 2

## 2023-05-01 MED ORDER — SODIUM CHLORIDE 0.9% FLUSH: 3.0000 mL | INTRAVENOUS | Status: DC | PRN

## 2023-05-01 MED ORDER — VERAPAMIL HCL 2.5 MG/ML IV SOLN
INTRAVENOUS | Status: DC | PRN
  Administered 2023-05-01: 2.5 mg via INTRAVENOUS

## 2023-05-01 MED ORDER — ENOXAPARIN SODIUM 40 MG/0.4ML IJ SOSY
40.0000 mg | PREFILLED_SYRINGE | INTRAMUSCULAR | Status: DC
  Administered 2023-05-02 – 2023-05-06 (×5): 40 mg via SUBCUTANEOUS
  Filled 2023-05-01 (×5): qty 0.4

## 2023-05-01 MED ORDER — FENTANYL CITRATE (PF) 100 MCG/2ML IJ SOLN
INTRAMUSCULAR | Status: AC
  Filled 2023-05-01: qty 2

## 2023-05-01 MED ORDER — HEPARIN SODIUM (PORCINE) 1000 UNIT/ML IJ SOLN
INTRAMUSCULAR | Status: AC
  Filled 2023-05-01: qty 10

## 2023-05-01 MED ORDER — FENTANYL CITRATE (PF) 100 MCG/2ML IJ SOLN
INTRAMUSCULAR | Status: DC | PRN
  Administered 2023-05-01 (×2): 25 ug via INTRAVENOUS

## 2023-05-01 MED ORDER — NITROGLYCERIN IN D5W 200-5 MCG/ML-% IV SOLN
INTRAVENOUS | Status: AC
  Filled 2023-05-01: qty 250

## 2023-05-01 MED ORDER — HEPARIN (PORCINE) IN NACL 1000-0.9 UT/500ML-% IV SOLN
INTRAVENOUS | Status: AC
  Filled 2023-05-01: qty 1000

## 2023-05-01 MED ORDER — LORAZEPAM 2 MG/ML IJ SOLN
1.0000 mg | Freq: Once | INTRAMUSCULAR | Status: DC
  Administered 2023-05-01: 1 mg via INTRAVENOUS

## 2023-05-01 MED ORDER — SODIUM CHLORIDE 0.9% FLUSH: 3.0000 mL | Freq: Two times a day (BID) | INTRAVENOUS | Status: DC

## 2023-05-01 MED ORDER — LORAZEPAM 1 MG PO TABS
1.0000 mg | ORAL_TABLET | Freq: Once | ORAL | Status: AC
  Administered 2023-05-01: 1 mg via ORAL
  Filled 2023-05-01: qty 1

## 2023-05-01 MED ORDER — VERAPAMIL HCL 2.5 MG/ML IV SOLN
INTRAVENOUS | Status: AC
  Filled 2023-05-01: qty 2

## 2023-05-01 MED ORDER — LORAZEPAM 2 MG/ML IJ SOLN
INTRAMUSCULAR | Status: AC
  Filled 2023-05-01: qty 1

## 2023-05-01 MED ORDER — TICAGRELOR 90 MG PO TABS
90.0000 mg | ORAL_TABLET | Freq: Two times a day (BID) | ORAL | Status: DC
  Administered 2023-05-01 – 2023-05-06 (×10): 90 mg via ORAL
  Filled 2023-05-01 (×10): qty 1

## 2023-05-01 MED ORDER — MIDAZOLAM HCL 2 MG/2ML IJ SOLN
INTRAMUSCULAR | Status: DC | PRN
  Administered 2023-05-01: 1 mg via INTRAVENOUS

## 2023-05-01 MED ORDER — NITROGLYCERIN 1 MG/10 ML FOR IR/CATH LAB
INTRA_ARTERIAL | Status: AC
  Filled 2023-05-01: qty 10

## 2023-05-01 MED ORDER — MIDAZOLAM HCL 2 MG/2ML IJ SOLN
INTRAMUSCULAR | Status: AC
  Filled 2023-05-01: qty 2

## 2023-05-01 MED ORDER — LIDOCAINE HCL (PF) 1 % IJ SOLN
INTRAMUSCULAR | Status: DC | PRN
  Administered 2023-05-01: 5 mL

## 2023-05-01 MED ORDER — FUROSEMIDE 10 MG/ML IJ SOLN
INTRAMUSCULAR | Status: DC | PRN
  Administered 2023-05-01: 40 mg via INTRAVENOUS

## 2023-05-01 MED ORDER — FUROSEMIDE 10 MG/ML IJ SOLN
INTRAMUSCULAR | Status: AC
  Filled 2023-05-01: qty 4

## 2023-05-01 MED ORDER — NITROGLYCERIN IN D5W 200-5 MCG/ML-% IV SOLN
INTRAVENOUS | Status: DC | PRN
  Administered 2023-05-01: 10 ug/min via INTRAVENOUS

## 2023-05-01 MED ORDER — FUROSEMIDE 10 MG/ML IJ SOLN
40.0000 mg | Freq: Two times a day (BID) | INTRAMUSCULAR | Status: DC
  Administered 2023-05-02: 40 mg via INTRAVENOUS
  Filled 2023-05-01: qty 4

## 2023-05-01 MED ORDER — HEPARIN SODIUM (PORCINE) 1000 UNIT/ML IJ SOLN
INTRAMUSCULAR | Status: DC | PRN
  Administered 2023-05-01: 6000 [IU] via INTRAVENOUS
  Administered 2023-05-01: 4000 [IU] via INTRAVENOUS
  Administered 2023-05-01 (×2): 2000 [IU] via INTRAVENOUS

## 2023-05-01 MED ORDER — NITROGLYCERIN 1 MG/10 ML FOR IR/CATH LAB
INTRA_ARTERIAL | Status: DC | PRN
  Administered 2023-05-01: 200 ug

## 2023-05-01 MED ORDER — NITROGLYCERIN IN D5W 200-5 MCG/ML-% IV SOLN: 0.0000 ug/min | INTRAVENOUS | Status: DC

## 2023-05-01 MED ORDER — TICAGRELOR 90 MG PO TABS
ORAL_TABLET | ORAL | Status: DC | PRN
  Administered 2023-05-01: 180 mg via ORAL

## 2023-05-01 SURGICAL SUPPLY — 22 items
BALLN TREK RX 2.5X15 (BALLOONS) ×1
BALLN ~~LOC~~ EUPHORA RX 2.75X20 (BALLOONS) ×1
BALLOON TREK RX 2.5X15 (BALLOONS) IMPLANT
BALLOON ~~LOC~~ EUPHORA RX 2.75X20 (BALLOONS) IMPLANT
CATH INFINITI 5FR JK (CATHETERS) IMPLANT
CATH LAUNCHER 6FR EBU3.5 (CATHETERS) IMPLANT
CATH LAUNCHER 6FR JR4 (CATHETERS) IMPLANT
DEVICE RAD TR BAND REGULAR (VASCULAR PRODUCTS) IMPLANT
DRAPE BRACHIAL (DRAPES) IMPLANT
DRAPE FEMORAL ANGIO W/ POUCH (DRAPES) IMPLANT
GLIDESHEATH SLEND SS 6F .021 (SHEATH) IMPLANT
GUIDEWIRE INQWIRE 1.5J.035X260 (WIRE) IMPLANT
INQWIRE 1.5J .035X260CM (WIRE) ×1
KIT ENCORE 26 ADVANTAGE (KITS) IMPLANT
KIT SYRINGE INJ CVI SPIKEX1 (MISCELLANEOUS) IMPLANT
PACK CARDIAC CATH (CUSTOM PROCEDURE TRAY) ×1 IMPLANT
PROTECTION STATION PRESSURIZED (MISCELLANEOUS) ×1
SET ATX-X65L (MISCELLANEOUS) IMPLANT
STATION PROTECTION PRESSURIZED (MISCELLANEOUS) IMPLANT
STENT ONYX FRONTIER 4.0X15 (Permanent Stent) IMPLANT
TUBING CIL FLEX 10 FLL-RA (TUBING) IMPLANT
WIRE RUNTHROUGH .014X180CM (WIRE) IMPLANT

## 2023-05-01 NOTE — Consult Note (Signed)
Advanced Heart Failure Team Consult Note   Primary Physician: Pcp, No PCP-Cardiologist:  None  Reason for Consultation: Heart Failure   HPI:    Ronnie Mann is seen today for evaluation of heart failure  at the request of Dr Kennith Maes .   Ronnie Mann is a 67 year old with a history of kideny stones, previous ureteral stents, former smoker (quit 5 years ago) and no previous coronary disease.   Presented to [facility] with increased shortness of breath and chest pain.  Admitted Acute HFrEF/UTI.  CTA negative PE, large staghorn calculus in the upper pole right kidney fills the upper pole collecting system. Left kidney incompletely visualized but demonstrates hydronephrosis . Urology consulted. He was offered l ureteral stent but he refused stent but now open to possible stent.   Pertinent labs WBC 14, lactic acid 2.5, BNP 530, HST Trop 40>[id number]. Started on heparin drip.   Taken for cath today. 2 vessel disease with balloon angioplasty to proximal LAD and DES to RCA. Developed flash pulmonary edema. Started on bipap , NTG drip, and given IV lasix.  On arrival to ICU he removed Bipap. Currently on NTG 3 mcg. Placed on [place] with stable sats.   Denies chest pain.   Imaging:  CXR - pulmonary edema.  CTA - negative for PE, 19 mm ground glass RLL, very large staghorn calculus in the upper pole right kidney fills the upper pole collecting system. Left kidney incompletely visualized but demonstrates hydronephrosis with overlying cortical thinning, and left adrenal adenoma.   CT Renal Stone-  Bilateral nephrolithiasis with staghorn calculus in the upper pole of the right kidney and additional calculus in the lower pole of the right kidney. Partial staghorn calculus of the lower pole of the left kidney. 2. There is evidence of significant hydronephrosis of the left kidney with cortical thinning and an obstructing calculus in the ureter at the juncture of proximal and mid thirds measuring 16 mm  in height and 8 x 8 mm in transverse dimensions. Cortical thinning implicates a chronic obstruction  LHC LVEDP 34 Prox LAD to Mid LAD lesion is 99% stenosed.   Mid LAD lesion is 60% stenosed.   1st Diag lesion is 70% stenosed.   Prox RCA lesion is 95% stenosed.   Mid RCA lesion is 30% stenosed.   RPDA lesion is 60% stenosed.   Dist LAD lesion is 99% stenosed.  Severe two-vessel coronary artery disease with subtotal occlusion of the proximal LAD, diffuse moderate mid LAD disease and severe distal LAD disease.  In addition, there is 95% in the proximal right coronary artery with faint right to left collaterals to the LAD.  The left circumflex has mild nonobstructive disease and OM 3 is large and reaches the apex. 2.  Severely reduced LV systolic function with an EF of 25%.  Severely elevated left ventricular end-diastolic pressure at 34 mmHg. 3.  Successful balloon angioplasty to the proximal LAD and drug-eluting stent placement to the proximal right coronary artery.  Echo  EF 30-35%  RV nomral. Moderate Ronnie   Review of Systems: [y] = yes, [ ]  = no   General: Weight gain [ ] ; Weight loss [ ] ; Anorexia [ ] ; Fatigue [ ] ; Fever [ ] ; Chills [ ] ; Weakness [ ]   Cardiac: Chest pain/pressure [ ] ; Resting SOB [ ] ; Exertional SOB [Y ]; Orthopnea [ ] ; Pedal Edema [ ] ; Palpitations [ ] ; Syncope [ ] ; Presyncope [ ] ; Paroxysmal nocturnal dyspnea[ ]   Pulmonary:  Cough [ ] ; Wheezing[ ] ; Hemoptysis[ ] ; Sputum [ ] ; Snoring [ ]   GI: Vomiting[ ] ; Dysphagia[ ] ; Melena[ ] ; Hematochezia [ ] ; Heartburn[ ] ; Abdominal pain [ ] ; Constipation [ ] ; Diarrhea [ ] ; BRBPR [ ]   GU: Hematuria[ ] ; Dysuria [ ] ; Nocturia[ ]   Vascular: Pain in legs with walking [ ] ; Pain in feet with lying flat [ ] ; Non-healing sores [ ] ; Stroke [ ] ; TIA [ ] ; Slurred speech [ ] ;  Neuro: Headaches[ ] ; Vertigo[ ] ; Seizures[ ] ; Paresthesias[ ] ;Blurred vision [ ] ; Diplopia [ ] ; Vision changes [ ]   Ortho/Skin: Arthritis [ ] ; Joint pain [Y ]; Muscle  pain [ ] ; Joint swelling [ ] ; Back Pain [ ] ; Rash [ ]   Psych: Depression[ ] ; Anxiety[ ]   Heme: Bleeding problems [ ] ; Clotting disorders [ ] ; Anemia [ ]   Endocrine: Diabetes [ ] ; Thyroid dysfunction[ ]   Home Medications Prior to Admission medications   Not on File    Past Medical History: Past Medical History:  Diagnosis Date   Kidney stones     Past Surgical History: History reviewed. No pertinent surgical history.  Family History: History reviewed. No pertinent family history.  Social History: Social History   Socioeconomic History   Marital status: Single    Spouse name: Not on file   Number of children: Not on file   Years of education: Not on file   Highest education level: Not on file  Occupational History   Not on file  Tobacco Use   Smoking status: Never   Smokeless tobacco: Never  Substance and Sexual Activity   Alcohol use: Never   Drug use: Never   Sexual activity: Not on file  Other Topics Concern   Not on file  Social History Narrative   Not on file   Social Determinants of Health   Financial Resource Strain: Not on file  Food Insecurity: Not on file  Transportation Needs: Not on file  Physical Activity: Not on file  Stress: Not on file  Social Connections: Not on file    Allergies:  No Known Allergies  Objective:    Vital Signs:   Temp:  [97.5 F (36.4 C)-98.4 F (36.9 C)] 97.8 F (36.6 C) (06/10 0722) Pulse Rate:  [62-108] 80 (06/10 0945) Resp:  [11-32] 21 (06/10 0945) BP: (112-156)/(55-100) 130/83 (06/10 0945) SpO2:  [79 %-100 %] 95 % (06/10 0945) FiO2 (%):  [40 %] 40 % (06/10 0945) Last BM Date : 04/30/23  Weight change: Filed Weights   04/28/23 1112  Weight: 95.3 kg    Intake/Output:   Intake/Output Summary (Last 24 hours) at 05/01/2023 1035 Last data filed at 05/01/2023 1000 Gross per 24 hour  Intake 1004.54 ml  Output 3125 ml  Net -2120.46 ml      Physical Exam    General:   No resp difficulty HEENT:  normal Neck: supple. JVP 9-10 . Carotids 2+ bilat; no bruits. No lymphadenopathy or thyromegaly appreciated. Cor: PMI nondisplaced. Regular rate & rhythm. No rubs, gallops or murmurs. Lungs: clear Abdomen: soft, nontender, nondistended. No hepatosplenomegaly. No bruits or masses. Good bowel sounds. Extremities: no cyanosis, clubbing, rash, edema Neuro: alert & orientedx3, cranial nerves grossly intact. moves all 4 extremities w/o difficulty. Affect pleasant   Telemetry   SR 90s   EKG      Labs   Basic Metabolic Panel: Recent Labs  Lab 04/28/23 1130 04/29/23 0507 04/30/23 0615 04/30/23 1154 05/01/23 0434  NA 138 139  --  142  --  K 4.1 4.4  --  4.2  --   CL 104 108  --  108  --   CO2 22 22  --  24  --   GLUCOSE 293* 146*  --  124*  --   BUN 30* 27*  --  27*  --   CREATININE 1.21 0.93  --  0.96 0.95  CALCIUM 9.8 8.8*  --  8.6*  --   MG  --   --  2.0  --   --   PHOS  --   --  2.7  --   --     Liver Function Tests: Recent Labs  Lab 04/28/23 1130 04/29/23 0507  AST 32 122*  ALT 22 30  ALKPHOS 101 93  BILITOT 0.8 1.1  PROT 7.7 7.1  ALBUMIN 4.2 4.1   Recent Labs  Lab 04/28/23 1130  LIPASE 29   No results for input(s): "AMMONIA" in the last 168 hours.  CBC: Recent Labs  Lab 04/28/23 1130 04/29/23 0507 04/30/23 0615 05/01/23 0435  WBC 14.4* 10.9* 7.6 7.3  HGB 12.8* 11.6* 10.5* 10.3*  HCT 39.8 35.3* 31.6* 31.8*  MCV 100.0 98.6 97.8 99.7  PLT 255 182 164 160    Cardiac Enzymes: No results for input(s): "CKTOTAL", "CKMB", "CKMBINDEX", "TROPONINI" in the last 168 hours.  BNP: BNP (last 3 results) Recent Labs    04/28/23 1130  BNP 530.9*    ProBNP (last 3 results) No results for input(s): "PROBNP" in the last 8760 hours.   CBG: Recent Labs  Lab 04/30/23 1119 04/30/23 1614 04/30/23 1931 04/30/23 2322 05/01/23 0347  GLUCAP 101* 132* 87 122* 87    Coagulation Studies: Recent Labs    04/29/23 0507  LABPROT 14.8  INR 1.1      Imaging   CARDIAC CATHETERIZATION  Result Date: 05/01/2023   Prox LAD to Mid LAD lesion is 99% stenosed.   Mid LAD lesion is 60% stenosed.   1st Diag lesion is 70% stenosed.   Prox RCA lesion is 95% stenosed.   Mid RCA lesion is 30% stenosed.   RPDA lesion is 60% stenosed.   Dist LAD lesion is 99% stenosed.   A drug-eluting stent was successfully placed using a STENT ONYX FRONTIER 4.0X15.   Balloon angioplasty was performed using a BALLN [place] EUPHORA RX 2.75X20.   Post intervention, there is a 20% residual stenosis.   Post intervention, there is a 0% residual stenosis.   There is severe left ventricular systolic dysfunction.   LV end diastolic pressure is severely elevated.   There is moderate (3+) mitral regurgitation. 1.  Severe two-vessel coronary artery disease with subtotal occlusion of the proximal LAD, diffuse moderate mid LAD disease and severe distal LAD disease.  In addition, there is 95% in the proximal right coronary artery with faint right to left collaterals to the LAD.  The left circumflex has mild nonobstructive disease and OM 3 is large and reaches the apex. 2.  Severely reduced LV systolic function with an EF of 25%.  Severely elevated left ventricular end-diastolic pressure at 34 mmHg. 3.  Successful balloon angioplasty to the proximal LAD and drug-eluting stent placement to the proximal right coronary artery. Recommendations: The patient developed progressive pulmonary edema as the case progressed.  He was given 1 dose of IV furosemide and was started on nitroglycerin drip.  He will be started on BiPAP. I elected not to place a stent in the LAD given poor distal flow and diffuse disease  overall.  In addition, placing a stent would risk closing the first diagonal. Recommend optimizing his heart failure and volume overload and consider relook angiography in the near future once he is optimized. I consulted advanced heart failure.   ECHOCARDIOGRAM COMPLETE  Result Date: 04/30/2023     ECHOCARDIOGRAM REPORT   Patient Name:   Ronnie Mann Date of Exam: 04/30/2023 Medical Rec #:  [id number]    Height:       69.0 in Accession #:    [id number]   Weight:       210.0 lb Date of Birth:  [birth year]    BSA:          2.109 m Patient Age:    58 years     BP:           103/76 mmHg Patient Gender: M            HR:           75 bpm. Exam Location:  [facility] Procedure: 2D Echo, Color Doppler, Cardiac Doppler and Intracardiac            Opacification Agent Indications:     R07.9 Chest Pain  History:         Patient has no prior history of Echocardiogram examinations. No                  cardiac history on file.  Sonographer:     L. Thornton-Maynard Referring Phys:  [id number] Shelbie Proctor PRATT Diagnosing Phys: Julien Nordmann MD  Sonographer Comments: Suboptimal apical window. IMPRESSIONS  1. Left ventricular ejection fraction, by estimation, is 30 to 35%. Left ventricular ejection fraction by PLAX is 32 %. The left ventricle has moderately decreased function. The left ventricle demonstrates global hypokinesis with severe hypokinesis of the anterior, anteroseptal and apical region. The left ventricular internal cavity size was mildly dilated. There is mild left ventricular hypertrophy. Left ventricular diastolic parameters are consistent with Grade II diastolic dysfunction (pseudonormalization).  2. Right ventricular systolic function is normal. The right ventricular size is normal. There is normal pulmonary artery systolic pressure.  3. The mitral valve is normal in structure. Moderate mitral valve regurgitation. No evidence of mitral stenosis.  4. The aortic valve is normal in structure. Aortic valve regurgitation is not visualized. No aortic stenosis is present.  5. There is mild dilatation of the ascending aorta, measuring 42 mm.  6. The inferior vena cava is normal in size with <50% respiratory variability, suggesting right atrial pressure of 8 mmHg. FINDINGS  Left Ventricle: Left ventricular ejection fraction, by estimation,  is 30 to 35%. Left ventricular ejection fraction by PLAX is 32 %. The left ventricle has moderately decreased function. The left ventricle demonstrates global hypokinesis. Definity contrast agent was given IV to delineate the left ventricular endocardial borders. The left ventricular internal cavity size was mildly dilated. There is mild left ventricular hypertrophy. Left ventricular diastolic parameters are consistent with Grade II diastolic dysfunction (pseudonormalization). Right Ventricle: The right ventricular size is normal. No increase in right ventricular wall thickness. Right ventricular systolic function is normal. There is normal pulmonary artery systolic pressure. The tricuspid regurgitant velocity is 2.00 m/s, and  with an assumed right atrial pressure of 3 mmHg, the estimated right ventricular systolic pressure is 19.0 mmHg. Left Atrium: Left atrial size was normal in size. Right Atrium: Right atrial size was normal in size. Pericardium: There is no evidence of pericardial effusion. Mitral Valve: The mitral valve is normal  in structure. There is mild thickening of the mitral valve leaflet(s). Moderate mitral valve regurgitation. No evidence of mitral valve stenosis. MV peak gradient, 5.9 mmHg. The mean mitral valve gradient is 3.0 mmHg. Tricuspid Valve: The tricuspid valve is normal in structure. Tricuspid valve regurgitation is not demonstrated. No evidence of tricuspid stenosis. Aortic Valve: The aortic valve is normal in structure. Aortic valve regurgitation is not visualized. No aortic stenosis is present. Aortic valve mean gradient measures 3.0 mmHg. Aortic valve peak gradient measures 4.9 mmHg. Aortic valve area, by VTI measures 1.91 cm. Pulmonic Valve: The pulmonic valve was normal in structure. Pulmonic valve regurgitation is not visualized. No evidence of pulmonic stenosis. Aorta: The aortic root is normal in size and structure. There is mild dilatation of the ascending aorta, measuring 42  mm. Venous: The inferior vena cava is normal in size with less than 50% respiratory variability, suggesting right atrial pressure of 8 mmHg. IAS/Shunts: No atrial level shunt detected by color flow Doppler.  LEFT VENTRICLE PLAX 2D LV EF:         Left            Diastology                ventricular     LV e' medial:    6.53 cm/s                ejection        LV E/e' medial:  16.8                fraction by     LV e' lateral:   6.74 cm/s                PLAX is 32      LV E/e' lateral: 16.3                %. LVIDd:         6.40 cm LVIDs:         5.40 cm LV PW:         1.30 cm LV IVS:        1.30 cm LVOT diam:     2.03 cm LV SV:         38 LV SV Index:   18 LVOT Area:     3.25 cm  LV Volumes (MOD) LV vol d, MOD    191.0 ml A2C: LV vol d, MOD    206.0 ml A4C: LV vol s, MOD    145.0 ml A2C: LV vol s, MOD    108.0 ml A4C: LV SV MOD A2C:   46.0 ml LV SV MOD A4C:   206.0 ml LV SV MOD BP:    72.7 ml RIGHT VENTRICLE             IVC RV Basal diam:  3.50 cm     IVC diam: 2.20 cm RV S prime:     10.30 cm/s TAPSE (M-mode): 2.6 cm LEFT ATRIUM           Index        RIGHT ATRIUM           Index LA diam:      4.30 cm 2.04 cm/m   RA Area:     17.70 cm LA Vol (A4C): 91.7 ml 43.47 ml/m  RA Volume:   43.90 ml  20.81 ml/m  AORTIC VALVE  PULMONIC VALVE AV Area (Vmax):    2.26 cm     PV Vmax:       0.85 m/s AV Area (Vmean):   2.15 cm     PV Peak grad:  2.9 mmHg AV Area (VTI):     1.91 cm AV Vmax:           110.50 cm/s AV Vmean:          76.100 cm/s AV VTI:            0.200 m AV Peak Grad:      4.9 mmHg AV Mean Grad:      3.0 mmHg LVOT Vmax:         77.00 cm/s LVOT Vmean:        50.300 cm/s LVOT VTI:          0.118 m LVOT/AV VTI ratio: 0.59  AORTA Ao Root diam: 4.15 cm Ao Asc diam:  4.20 cm MITRAL VALVE                  TRICUSPID VALVE MV Area (PHT): 5.13 cm       TR Peak grad:   16.0 mmHg MV Area VTI:   1.33 cm       TR Vmax:        200.00 cm/s MV Peak grad:  5.9 mmHg MV Mean grad:  3.0 mmHg       SHUNTS MV  Vmax:       1.21 m/s       Systemic VTI:  0.12 m MV Vmean:      77.7 cm/s      Systemic Diam: 2.03 cm MV Decel Time: 148 msec Ronnie Peak grad:    109.8 mmHg Ronnie Mean grad:    68.0 mmHg Ronnie Vmax:         524.00 cm/s Ronnie Vmean:        385.0 cm/s Ronnie PISA:         2.26 cm Ronnie PISA Eff ROA: 16 mm Ronnie PISA Radius:  0.60 cm MV E velocity: 110.00 cm/s MV A velocity: 61.80 cm/s MV E/A ratio:  1.78 Julien Nordmann MD Electronically signed by Julien Nordmann MD Signature Date/Time: 04/30/2023/1:02:34 PM    Final      Medications:     Current Medications:  amLODipine  5 mg Oral Daily   aspirin EC  81 mg Oral Daily   atorvastatin  40 mg Oral Daily   carvedilol  3.125 mg Oral BID WC   Chlorhexidine Gluconate Cloth  6 each Topical Daily   [START ON 05/02/2023] enoxaparin (LOVENOX) injection  40 mg Subcutaneous Q24H   folic acid  1 mg Intravenous Daily   insulin aspart  0-15 Units Subcutaneous Q4H   insulin detemir  5 Units Subcutaneous QHS   metoprolol tartrate  25 mg Oral BID   sodium chloride flush  3 mL Intravenous Q12H   sodium chloride flush  3 mL Intravenous Q12H   sodium chloride flush  3 mL Intravenous Q12H   sodium chloride flush  3 mL Intravenous Q12H   thiamine (VITAMIN B1) injection  100 mg Intravenous Daily   ticagrelor  90 mg Oral BID    Infusions:  sodium chloride     azithromycin Stopped (04/30/23 1127)   cefTRIAXone (ROCEPHIN)  IV Stopped (04/30/23 1207)   heparin Stopped (05/01/23 0725)   nitroGLYCERIN 10 mcg/min (05/01/23 1007)      Patient Profile   Ronnie Mann is a  67 year old with a history of kideny stones, previous ureteral stents, former smoker (quit 5 years ago) and no previous coronary disease.   Admitted with chest pain-->NSTEMI, Acute HFrEF, and UTI/left Hydronephrosis.    Assessment/Plan   1. NTEMI HS Trop 54>234 >23000 LHC today  with severe 2 vessel disease subtotal proximal, moderate mid LAD and severe distal LAD. 95% RCA 95%. Ballon angioplasty proximal  LAD with  DES to RCA.  No chest pain.  Stop NTG.  On brillinta + aspirin+ atorvastatin.   2. Acute HFrEF , ICM  Echo EF 30-35% RV normal Moderate Ronnie . No previous Echo  LVEPD - 34. Given IV  lasix in cath lab.  So far 650 cc urine output./  - Carefully add GDMT with kidney stones.  - Continue low dose coreg 3.125 mg twice a day - Add 12.5 mg spironolactone daily   - Hold off on SGLT2i with UTI - Plan to repeat ECHO 3 months after HF meds optimzed.    3.Acute Respiratory Failure -->pulmonary edema Placed on Bipap during cath. Now on [place].  Continue diurese.   4.  HTN Stable.   5 L Proximal Ureteral Obstruction/ Stones/ UTI  Urology following. Initially refused stent but willing to reconsider.   -On antibiotics  - WBC 14>10    Length of Stay: 3  Kristyna Bradstreet, NP  05/01/2023, 10:35 AM  Advanced Heart Failure Team Pager [phone] (M-F; 7a - 5p)  Please contact [facility] for night-coverage after hours (4p -7a ) and weekends on [email or web address]

## 2023-05-01 NOTE — Progress Notes (Signed)
Progress Note   Patient: Ronnie Mann ZOX:[id number] DOB: 03/21/1956 DOA: 04/28/2023     3 DOS: the patient was seen and examined on 05/01/2023   Brief hospital course: Ronnie Mann is a 67 y.o. male with medical history significant of  nephrolithiasis requiring multiple surgeries and lithotripsies in the past.  He reports right-sided kidney pain that has been ongoing and related to that for a long time.  He also reports URI congestion and runny nose for the last week prior to admission.  He reports new onset of substernal "vice like" substernal chest pain that radiated to his left arm.  He reports ongoing shortness of breath associated with this.  He also was awoken from sleep with this last night and had broken out into a cold sweat.  In the ED he was noted to have an elevated lactic acid and elevated WBC at 14.4.  He was given a liter of IV fluids.  Chest x-ray was done and showed pulmonary edema and his BNP said came back at 530.  He was not given further IV fluid boluses.  His initial troponin was 54 rose to 234.  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  Additionally patient was noted to have a CBG of 298 and did report polyuria and polydipsia at home.   Assessment and Plan:  *Acute systolic CHF (congestive heart failure) (HCC) Secondary to ischemic cardiomyopathy 2D echocardiogram showed an LVEF of 30 to 35% with decreased LV systolic function.  Patient noted to have left ventricular global hypokinesis with severe hypokinesis of the anterior, anteroseptal and apical region with grade 2 diastolic dysfunction. Developed pulmonary edema during left heart cath Continue nitroglycerin drip to reduce preload. Continue furosemide,  carvedilol and spironolactone Awaiting consult from advanced heart failure physician       Acute non-ST elevation MI Patient presented for evaluation of chest pain over the left anterior chest wall with radiation to the left arm Noted to have significantly elevated troponin level and 2D echocardiogram shows evidence of regional wall motion abnormality Currently chest pain-free Status post left heart cath which showed severe two-vessel coronary artery disease with subtotal occlusion of the proximal LAD, diffuse moderate mid LAD disease and severe distal LAD disease.  In addition, there is 95% in the proximal right coronary artery with faint right to left collaterals to the LAD.  The left circumflex has mild nonobstructive disease and OM 3 is large and reaches the apex. Severely reduced LV systolic function with an EF of 25%.  Severely elevated left ventricular end-diastolic pressure at 34 mmHg. Status post successful balloon angioplasty to the proximal LAD and drug-eluting stent placement to the proximal right coronary artery. Continue aspirin, high intensity statin, carvedilol and Brilinta       History of nephrolithiasis/left hydronephrosis/staghorn calculi Sepsis from a urinary source Patient noted to have significant pyuria, tachycardia, tachypnea, lactic acidosis and leukocytosis Continue empiric antibiotic therapy with Rocephin Follow-up results of urine culture Appreciate urology input, plan is for ureteroscopy and stent placement after his cardiac intervention planned for 05/01/23 Will discuss with urology about when procedure will be scheduled for     Community-acquired pneumonia Continue Rocephin and Zithromax         Uncontrolled type  2 diabetes mellitus with hyperglycemia, without long-term current use of insulin (HCC) New onset with random CBG of 298 today.   He does report polyuria and polydipsia at home Maintain consistent carbohydrate diet Glycemic control with  sliding scale insulin Diabetes education                        Subjective: Patient is seen and examined at the bedside.  He appears very upset and tearful and wants to be discharged home  Physical Exam: Vitals:   05/01/23 0923 05/01/23 0925 05/01/23 0930 05/01/23 0945  BP:   128/79 130/83  Pulse:  95 85 80  Resp:  (!) 21 15 (!) 21  Temp:      TempSrc:      SpO2: 97% 97% 96% 95%  Weight:      Height:       (General appearance - in mild to moderate distress and chronically ill-appearing Neck - supple, no significant adenopathy Chest -scattered wheezes Heart - regular rate and rhythm S1-S2 Back-right CVA tenderness Abdomen - soft, nontender, nondistended, no masses or organomegaly Extremities -trace pedal edema noted Skin -petechiae noted about the ankles  Data Reviewed: Labs reviewed.  Serum creatinine 0.95, hemoglobin 10.3 There are no new results to review at this time.  Family Communication: Greater than 50% of time was spent discussing patient's condition and plan of care with him at the bedside.  All questions and concerns have been addressed.  He verbalizes understanding and agrees with the plan.  Disposition: Status is: Inpatient Remains inpatient appropriate because: Observation following stent angioplasty.  Treatment for acute pulmonary edema  Planned Discharge Destination: Home    Time spent: 40 minutes  Author: Lucile Shutters, MD 05/01/2023 2:40 PM  For on call review [email or web address].

## 2023-05-01 NOTE — Interval H&P Note (Signed)
History and Physical Interval Note:  05/01/2023 7:55 AM  Ronnie Mann  has presented today for surgery, with the diagnosis of Non-ST elevated myocardial infarction.  The various methods of treatment have been discussed with the patient and family. After consideration of risks, benefits and other options for treatment, the patient has consented to  Procedure(s): LEFT HEART CATH AND CORONARY ANGIOGRAPHY (N/A) as a surgical intervention.  The patient's history has been reviewed, patient examined, no change in status, stable for surgery.  I have reviewed the patient's chart and labs.  Questions were answered to the patient's satisfaction.     Lorine Bears

## 2023-05-01 NOTE — TOC Benefit Eligibility Note (Signed)
Patient Product/process development scientist completed.    The patient is currently admitted and upon discharge could be taking Brilinta 90 mg.  The current 30 day co-pay is $0.00.   The patient is insured through [employer] Part D   This test claim was processed through [facility]- copay amounts may vary at other pharmacies due to pharmacy/plan contracts, or as the patient moves through the different stages of their insurance plan.  Roland Earl, CPHT Pharmacy Patient Advocate Specialist [facility] Patient Advocate Team Direct Number: [phone]  Fax: [phone]

## 2023-05-01 NOTE — Consult Note (Signed)
ANTICOAGULATION CONSULT NOTE  Pharmacy Consult for Heparin Infusion  Indication: chest pain/ACS  No Known Allergies  Patient Measurements: Height: 5\' 9"  (175.3 cm) Weight: 95.3 kg (210 lb) IBW/kg (Calculated) : 70.7 Heparin Dosing Weight: 90.4 kg  Vital Signs: Temp: 98.3 F (36.8 C) (06/10 0200) Temp Source: Oral (06/10 0200) BP: 118/64 (06/10 0500) Pulse Rate: 69 (06/10 0500)  Labs: Recent Labs    04/28/23 1130 04/28/23 1347 04/29/23 0015 04/29/23 0504 04/29/23 0507 04/29/23 1101 04/30/23 0615 04/30/23 1154 05/01/23 0435  HGB 12.8*  --   --   --  11.6*  --  10.5*  --  10.3*  HCT 39.8  --   --   --  35.3*  --  31.6*  --  31.8*  PLT 255  --   --   --  182  --  164  --  160  APTT  --   --   --   --  51*  --   --   --   --   LABPROT  --   --   --   --  14.8  --   --   --   --   INR  --   --   --   --  1.1  --   --   --   --   HEPARINUNFRC  --   --    < >  --   --    < > 0.43 0.41 0.37  CREATININE 1.21  --   --   --  0.93  --   --  0.96  --   TROPONINIHS 54* 234*  --  23,432*  --   --   --   --   --    < > = values in this interval not displayed.     Estimated Creatinine Clearance: 85 mL/min (by C-G formula based on SCr of 0.96 mg/dL).   Medical History: Past Medical History:  Diagnosis Date   Kidney stones     Medications:  Scheduled:   amLODipine  5 mg Oral Daily   aspirin  81 mg Oral Pre-Cath   aspirin  81 mg Oral Pre-Cath   aspirin EC  81 mg Oral Daily   atorvastatin  40 mg Oral Daily   Chlorhexidine Gluconate Cloth  6 each Topical Daily   folic acid  1 mg Intravenous Daily   insulin aspart  0-15 Units Subcutaneous Q4H   insulin detemir  5 Units Subcutaneous QHS   metoprolol tartrate  25 mg Oral BID   sodium chloride flush  3 mL Intravenous Q12H   sodium chloride flush  3 mL Intravenous Q12H   sodium chloride flush  3 mL Intravenous Q12H   thiamine (VITAMIN B1) injection  100 mg Intravenous Daily   Infusions:   sodium chloride     sodium  chloride     sodium chloride Stopped (05/01/23 0518)   sodium chloride 1 mL/kg/hr (05/01/23 0436)   azithromycin Stopped (04/30/23 1127)   cefTRIAXone (ROCEPHIN)  IV Stopped (04/30/23 1207)   heparin 1,600 Units/hr (05/01/23 0400)   PRN: sodium chloride, sodium chloride, acetaminophen **OR** acetaminophen, fentaNYL (SUBLIMAZE) injection, ipratropium, metoprolol tartrate, ondansetron **OR** ondansetron (ZOFRAN) IV, oxyCODONE, polyethylene glycol, sodium chloride flush, sodium chloride flush  Assessment: Ronnie Mann is a 67 y.o. male presenting with chest pain. PMH significant for T2DM, HTN. Patient was not on [facility] PTA per chart review. Pharmacy has been consulted to initiate and manage  heparin infusion.   Baseline Labs: Hgb 12.8, Hct 39.8, Plt 255  Baseline aPTT, PT, INR not taken prior to the start of the infusion  Goal of Therapy:  Heparin level 0.3-0.7 units/ml Monitor platelets by anticoagulation protocol: Yes   Date Time HL Rate/Comment  6/8 0015 0.16 1100/subtherapeutic  6/8 1101 0.11 1100/subtherapeutic 6/8 1757 0.31 1400/therapeutic x1 6/8 2354 0.23 1400/subtherapeutic 6/9 0615 0.43 1600/therapeutic x1  6/9 1154 0.41 1600/therapeutic x2 6/10     0435    0.37    1600/therapeutic X 3   Plan:  Continue heparin infusion at 1600 units/hr Check HL in daily while on heparin infusion  Continue to monitor H&H and platelets daily while on heparin infusion   Argenis Kumari D 05/01/2023 5:24 AM

## 2023-05-02 DIAGNOSIS — I5021 Acute systolic (congestive) heart failure: Secondary | ICD-10-CM | POA: Diagnosis not present

## 2023-05-02 DIAGNOSIS — R739 Hyperglycemia, unspecified: Secondary | ICD-10-CM | POA: Diagnosis present

## 2023-05-02 DIAGNOSIS — N202 Calculus of kidney with calculus of ureter: Secondary | ICD-10-CM

## 2023-05-02 LAB — CBC
HCT: 28.8 % — ABNORMAL LOW (ref 39.0–52.0)
Hemoglobin: 9.7 g/dL — ABNORMAL LOW (ref 13.0–17.0)
MCH: 33.2 pg (ref 26.0–34.0)
MCHC: 33.7 g/dL (ref 30.0–36.0)
MCV: 98.6 fL (ref 80.0–100.0)
Platelets: 157 10*3/uL (ref 150–400)
RBC: 2.92 MIL/uL — ABNORMAL LOW (ref 4.22–5.81)
RDW: 13.5 % (ref 11.5–15.5)
WBC: 6.5 10*3/uL (ref 4.0–10.5)
nRBC: 0 % (ref 0.0–0.2)

## 2023-05-02 LAB — BASIC METABOLIC PANEL
Anion gap: 8 (ref 5–15)
BUN: 28 mg/dL — ABNORMAL HIGH (ref 8–23)
CO2: 28 mmol/L (ref 22–32)
Calcium: 8.6 mg/dL — ABNORMAL LOW (ref 8.9–10.3)
Chloride: 107 mmol/L (ref 98–111)
Creatinine, Ser: 1 mg/dL (ref 0.61–1.24)
GFR, Estimated: >60 mL/min (ref >60)
Glucose, Bld: 120 mg/dL — ABNORMAL HIGH (ref 70–99)
Potassium: 3.7 mmol/L (ref 3.5–5.1)
Sodium: 143 mmol/L (ref 135–145)

## 2023-05-02 LAB — GLUCOSE, CAPILLARY
Glucose-Capillary: 108 mg/dL — ABNORMAL HIGH (ref 70–99)
Glucose-Capillary: 121 mg/dL — ABNORMAL HIGH (ref 70–99)
Glucose-Capillary: 216 mg/dL — ABNORMAL HIGH (ref 70–99)
Glucose-Capillary: 168 mg/dL — ABNORMAL HIGH (ref 70–99)

## 2023-05-02 LAB — CULTURE, BLOOD (ROUTINE X 2): Special Requests: ADEQUATE

## 2023-05-02 MED ORDER — FUROSEMIDE 10 MG/ML IJ SOLN: 40.0000 mg | Freq: Every day | INTRAMUSCULAR | Status: DC

## 2023-05-02 MED ORDER — LORAZEPAM 0.5 MG PO TABS
0.5000 mg | ORAL_TABLET | Freq: Two times a day (BID) | ORAL | Status: DC | PRN
  Administered 2023-05-02 – 2023-05-03 (×2): 0.5 mg via ORAL
  Filled 2023-05-02 (×2): qty 1

## 2023-05-02 MED ORDER — LOSARTAN POTASSIUM 50 MG PO TABS
25.0000 mg | ORAL_TABLET | Freq: Every day | ORAL | Status: DC
  Administered 2023-05-02: 25 mg via ORAL
  Filled 2023-05-02: qty 1

## 2023-05-02 MED ORDER — MELATONIN 5 MG PO TABS
5.0000 mg | ORAL_TABLET | Freq: Every evening | ORAL | Status: DC | PRN
  Administered 2023-05-02 – 2023-05-05 (×3): 5 mg via ORAL
  Filled 2023-05-02 (×3): qty 1

## 2023-05-02 NOTE — Progress Notes (Signed)
Progress Note   Patient: Ronnie Mann DOB: 19-Jan-1956 DOA: 04/28/2023     4 DOS: the patient was seen and examined on 05/02/2023   Brief hospital course: Ronnie Mann is a 67 y.o. male with medical history significant of  nephrolithiasis requiring multiple surgeries and lithotripsies in the past.  He reports right-sided kidney pain that has been ongoing and related to that for a long time.  He also reports URI congestion and runny nose for the last week prior to admission.  He reports new onset of substernal "vice like" substernal chest pain that radiated to his left arm.  He reports ongoing shortness of breath associated with this.  He also was awoken from sleep with this last night and had broken out into a cold sweat.  In the ED he was noted to have an elevated lactic acid and elevated WBC at 14.4.  He was given a liter of IV fluids.  Chest x-ray was done and showed pulmonary edema and his BNP said came back at 530.  He was not given further IV fluid boluses.  His initial troponin was 54 rose to 234.  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  Additionally patient was noted to have a CBG of 298 and did report polyuria and polydipsia at home.   Assessment and Plan:  *Acute systolic CHF (congestive heart failure) (HCC) Secondary to ischemic cardiomyopathy 2D echocardiogram showed an LVEF of 30 to 35% with decreased LV systolic function.  Patient noted to have left ventricular global hypokinesis with severe hypokinesis of the anterior, anteroseptal and apical region with grade 2 diastolic dysfunction. Developed flash pulmonary edema during left heart cath 06/10 and was treated with nitroglycerin drip to reduce  afterload Continue furosemide, carvedilol and spironolactone Appreciate cardiology input, patient requires diuresis for the next 24 to 48 hours prior to discharge       Acute non-ST elevation MI Patient presented for evaluation of chest pain over the left anterior chest wall with radiation to the left arm Noted to have significantly elevated troponin level and 2D echocardiogram shows evidence of regional wall motion abnormality Currently chest pain-free Status post left heart cath which showed severe two-vessel coronary artery disease with subtotal occlusion of the proximal LAD, diffuse moderate mid LAD disease and severe distal LAD disease.  In addition, there is 95% in the proximal right coronary artery with faint right to left collaterals to the LAD.  The left circumflex has mild nonobstructive disease and OM 3 is large and reaches the apex. Severely reduced LV systolic function with an EF of 25%.  Severely elevated left ventricular end-diastolic pressure at 34 mmHg. Status post successful balloon angioplasty to the proximal LAD and drug-eluting stent placement to the proximal right coronary artery. Continue aspirin, high intensity statin, carvedilol and Brilinta       History of nephrolithiasis/left hydronephrosis/staghorn calculi Sepsis from a urinary source Patient noted to have significant pyuria, tachycardia, tachypnea, lactic acidosis and leukocytosis Urine culture is sterile Patient was treated empirically with Rocephin Discussed with urology, initial plan was for ureteroscopy and stent placement after his cardiac intervention but at this time no further procedures planned for this patient. Follow-up with urology as an outpatient  Community-acquired pneumonia Completed IV Rocephin and Zithromax         Hyperglycemia in the setting of acute non-ST elevation MI No known history of diabetes mellitus Random CBG of 298 on admission Started on insulin Hemoglobin A1c is  5.5 Will discontinue insulin due to increased risk for hypoglycemia Maintain consistent carbohydrate diet Will discharge on metformin              Subjective: Patient is seen and examined at the bedside.  Sitting up in a chair and feels better.  Physical Exam: Vitals:   05/02/23 0400 05/02/23 0500 05/02/23 0600 05/02/23 0757  BP: 102/68 108/74 111/65   Pulse: 66 81 69   Resp: 20     Temp: 98.4 F (36.9 C)     TempSrc: Oral     SpO2: 100% 99% 100% 98%  Weight:      Height:       General appearance - in mild to moderate distress and chronically ill-appearing Neck - supple, no significant adenopathy Chest -scattered wheezes Heart - regular rate and rhythm S1-S2 Back-right CVA tenderness Abdomen - soft, nontender, nondistended, no masses or organomegaly Extremities -trace pedal edema noted Skin -petechiae noted about the ankles Data Reviewed: Labs reviewed.  Creatinine 1.0 There are no new results to review at this time.  Family Communication: Greater than 50% of time was spent discussing patient's condition and plan of care with patient at the bedside.  All questions and concerns have been addressed.  He verbalizes understanding and agrees with the plan.  Disposition: Status is: Inpatient Remains inpatient appropriate because: Requires diuresis for the next 24 to 48 hours  Planned Discharge Destination: Home    Time spent: 35 minutes  Author: Lucile Shutters, MD 05/02/2023 9:17 AM  For on call review [email or web address].

## 2023-05-02 NOTE — Inpatient Diabetes Management (Signed)
Inpatient Diabetes Program Recommendations  AACE/ADA: New Consensus Statement on Inpatient Glycemic Control  Target Ranges:  Prepandial:   less than 140 mg/dL      Peak postprandial:   less than 180 mg/dL (1-2 hours)      Critically ill patients:  140 - 180 mg/dL    Latest Reference Range & Units 05/01/23 03:47 05/01/23 11:35 05/01/23 16:21 05/01/23 20:15 05/01/23 20:48 05/01/23 23:09 05/02/23 03:28 05/02/23 08:03  Glucose-Capillary 70 - 99 mg/dL 87 161 (H) 096 (H) 66 (L) 129 (H) 142 (H) 108 (H) 121 (H)    Latest Reference Range & Units 04/28/23 11:30 04/28/23 17:12 04/29/23 05:07 04/30/23 11:54 05/02/23 04:13  Glucose 70 - 99 mg/dL 045 (H)  409 (H) 811 (H) 120 (H)  Hemoglobin A1C 4.8 - 5.6 %  5.5      Review of Glycemic Control  Diabetes history: NO Outpatient Diabetes medications: NA Current orders for Inpatient glycemic control: Levemir 5 units QHS, Novolog 0-15 units Q4H  Inpatient Diabetes Program Recommendations:    Insulin: CBG 66 mg/dl at 91:47 on 07/20/55.  Please discontinue Levemir.   HbgA1C:  A1C 5.5% on 04/28/23 indicating an average glucose of 111 mg/dl over the past 2-3 months.  Thanks, Orlando Penner, RN, MSN, CDCES Diabetes Coordinator Inpatient Diabetes Program [phone] (Team Pager from 8am to 5pm)

## 2023-05-02 NOTE — Progress Notes (Signed)
Rounding Note    Patient Name: Ronnie Mann Date of Encounter: 05/02/2023  [facility] Cardiologist: [facility]  Subjective   He underwent left heart catheterization yesterday which showed severe two-vessel coronary artery disease with subtotal occlusion of the proximal LAD and diffuse mid to distal LAD disease as well as 95% stenosis in the proximal right coronary artery.  His ejection fraction was 25% with an LVEDP of 34 mmHg.  I performed successful balloon angioplasty to proximal LAD and drug-eluting stent placement to the proximal right coronary artery.  He developed flash pulmonary edema and was started on IV nitroglycerin, IV Lasix and BiPAP.  He had significant urine output and he is -8.4 L for the admission.  He is no longer hypoxic and feels significantly better.  He was weaned off nitroglycerin drip.  Inpatient Medications    Scheduled Meds:  aspirin EC  81 mg Oral Daily   atorvastatin  40 mg Oral Daily   carvedilol  3.125 mg Oral BID WC   Chlorhexidine Gluconate Cloth  6 each Topical Daily   enoxaparin (LOVENOX) injection  40 mg Subcutaneous Q24H   folic acid  1 mg Intravenous Daily   furosemide  40 mg Intravenous Once   [START ON 05/03/2023] furosemide  40 mg Intravenous Daily   sodium chloride flush  3 mL Intravenous Q12H   sodium chloride flush  3 mL Intravenous Q12H   sodium chloride flush  3 mL Intravenous Q12H   sodium chloride flush  3 mL Intravenous Q12H   spironolactone  12.5 mg Oral Daily   thiamine (VITAMIN B1) injection  100 mg Intravenous Daily   ticagrelor  90 mg Oral BID   Continuous Infusions:  sodium chloride     azithromycin Stopped (05/01/23 1309)   cefTRIAXone (ROCEPHIN)  IV Stopped (05/01/23 1425)   PRN Meds: sodium chloride, acetaminophen **OR** acetaminophen, fentaNYL (SUBLIMAZE) injection, ipratropium, ondansetron **OR** ondansetron (ZOFRAN) IV, oxyCODONE, polyethylene glycol, sodium chloride flush   Vital Signs    Vitals:    05/02/23 0400 05/02/23 0500 05/02/23 0600 05/02/23 0757  BP: 102/68 108/74 111/65   Pulse: 66 81 69   Resp: 20     Temp: 98.4 F (36.9 C)     TempSrc: Oral     SpO2: 100% 99% 100% 98%  Weight:      Height:        Intake/Output Summary (Last 24 hours) at 05/02/2023 0934 Last data filed at 05/02/2023 0757 Gross per 24 hour  Intake 494.78 ml  Output 4025 ml  Net -3530.22 ml       04/28/2023   11:12 AM 07/01/2020    4:02 PM  Last 3 Weights  Weight (lbs) 210 lb 230 lb  Weight (kg) 95.255 kg 104.327 kg      Telemetry    Normal sinus rhythm- Personally Reviewed  ECG     - Personally Reviewed  Physical Exam   Constitutional:  oriented to person, place, and time. No distress.  HENT:  Head: Grossly normal Eyes:  no discharge. No scleral icterus.  Neck: No JVD, no carotid bruits  Cardiovascular: Regular rate and rhythm, no murmurs appreciated Pulmonary/Chest: Clear to auscultation bilaterally, no wheezes or rails Abdominal: Soft.  no distension.  no tenderness.  Musculoskeletal: Normal range of motion Neurological:  normal muscle tone. Coordination normal. No atrophy Skin: Skin warm and dry Psychiatric: normal affect, pleasant   Labs    High Sensitivity Troponin:   Recent Labs  Lab 04/28/23 1130  04/28/23 1347 04/29/23 0504  TROPONINIHS 54* 234* 23,432*      Chemistry Recent Labs  Lab 04/28/23 1130 04/29/23 0507 04/30/23 0615 04/30/23 1154 05/01/23 0434 05/02/23 0413  NA 138 139  --  142  --  143  K 4.1 4.4  --  4.2  --  3.7  CL 104 108  --  108  --  107  CO2 22 22  --  24  --  28  GLUCOSE 293* 146*  --  124*  --  120*  BUN 30* 27*  --  27*  --  28*  CREATININE 1.21 0.93  --  0.96 0.95 1.00  CALCIUM 9.8 8.8*  --  8.6*  --  8.6*  MG  --   --  2.0  --   --   --   PROT 7.7 7.1  --   --   --   --   ALBUMIN 4.2 4.1  --   --   --   --   AST 32 122*  --   --   --   --   ALT 22 30  --   --   --   --   ALKPHOS 101 93  --   --   --   --   BILITOT 0.8 1.1   --   --   --   --   GFRNONAA >60 >60  --  >60 >60 >60  ANIONGAP 12 9  --  10  --  8     Lipids No results for input(s): "CHOL", "TRIG", "HDL", "LABVLDL", "LDLCALC", "CHOLHDL" in the last 168 hours.  Hematology Recent Labs  Lab 04/30/23 0615 05/01/23 0435 05/02/23 0413  WBC 7.6 7.3 6.5  RBC 3.23* 3.19* 2.92*  HGB 10.5* 10.3* 9.7*  HCT 31.6* 31.8* 28.8*  MCV 97.8 99.7 98.6  MCH 32.5 32.3 33.2  MCHC 33.2 32.4 33.7  RDW 13.7 13.5 13.5  PLT 164 160 157    Thyroid  Recent Labs  Lab 04/28/23 1130  TSH 4.483     BNP Recent Labs  Lab 04/28/23 1130  BNP 530.9*     DDimer No results for input(s): "DDIMER" in the last 168 hours.   Radiology    CARDIAC CATHETERIZATION  Result Date: 05/01/2023   Prox LAD to Mid LAD lesion is 99% stenosed.   Mid LAD lesion is 60% stenosed.   1st Diag lesion is 70% stenosed.   Prox RCA lesion is 95% stenosed.   Mid RCA lesion is 30% stenosed.   RPDA lesion is 60% stenosed.   Dist LAD lesion is 99% stenosed.   A drug-eluting stent was successfully placed using a STENT ONYX FRONTIER 4.0X15.   Balloon angioplasty was performed using a BALLN  EUPHORA RX 2.75X20.   Post intervention, there is a 20% residual stenosis.   Post intervention, there is a 0% residual stenosis.   There is severe left ventricular systolic dysfunction.   LV end diastolic pressure is severely elevated.   There is moderate (3+) mitral regurgitation. 1.  Severe two-vessel coronary artery disease with subtotal occlusion of the proximal LAD, diffuse moderate mid LAD disease and severe distal LAD disease.  In addition, there is 95% in the proximal right coronary artery with faint right to left collaterals to the LAD.  The left circumflex has mild nonobstructive disease and OM 3 is large and reaches the apex. 2.  Severely reduced LV systolic function with an EF of 25%.  Severely elevated left ventricular end-diastolic pressure at 34 mmHg. 3.  Successful balloon angioplasty to the proximal LAD  and drug-eluting stent placement to the proximal right coronary artery. Recommendations: The patient developed progressive pulmonary edema as the case progressed.  He was given 1 dose of IV furosemide and was started on nitroglycerin drip.  He will be started on BiPAP. I elected not to place a stent in the LAD given poor distal flow and diffuse disease overall.  In addition, placing a stent would risk closing the first diagonal. Recommend optimizing his heart failure and volume overload and consider relook angiography in the near future once he is optimized. I consulted advanced heart failure.   ECHOCARDIOGRAM COMPLETE  Result Date: 04/30/2023    ECHOCARDIOGRAM REPORT   Patient Name:   Ronnie Mann Date of Exam: 04/30/2023 Medical Rec #:  [id number]    Height:       69.0 in Accession #:    [id number]   Weight:       210.0 lb Date of Birth:  September 30, 1956    BSA:          2.109 m Patient Age:    67 years     BP:           103/76 mmHg Patient Gender: M            HR:           75 bpm. Exam Location:  [facility] Procedure: 2D Echo, Color Doppler, Cardiac Doppler and Intracardiac            Opacification Agent Indications:     R07.9 Chest Pain  History:         Patient has no prior history of Echocardiogram examinations. No                  cardiac history on file.  Sonographer:     L. Thornton-Maynard Referring Phys:  [id number] Shelbie Proctor PRATT Diagnosing Phys: Julien Nordmann MD  Sonographer Comments: Suboptimal apical window. IMPRESSIONS  1. Left ventricular ejection fraction, by estimation, is 30 to 35%. Left ventricular ejection fraction by PLAX is 32 %. The left ventricle has moderately decreased function. The left ventricle demonstrates global hypokinesis with severe hypokinesis of the anterior, anteroseptal and apical region. The left ventricular internal cavity size was mildly dilated. There is mild left ventricular hypertrophy. Left ventricular diastolic parameters are consistent with Grade II diastolic dysfunction  (pseudonormalization).  2. Right ventricular systolic function is normal. The right ventricular size is normal. There is normal pulmonary artery systolic pressure.  3. The mitral valve is normal in structure. Moderate mitral valve regurgitation. No evidence of mitral stenosis.  4. The aortic valve is normal in structure. Aortic valve regurgitation is not visualized. No aortic stenosis is present.  5. There is mild dilatation of the ascending aorta, measuring 42 mm.  6. The inferior vena cava is normal in size with <50% respiratory variability, suggesting right atrial pressure of 8 mmHg. FINDINGS  Left Ventricle: Left ventricular ejection fraction, by estimation, is 30 to 35%. Left ventricular ejection fraction by PLAX is 32 %. The left ventricle has moderately decreased function. The left ventricle demonstrates global hypokinesis. Definity contrast agent was given IV to delineate the left ventricular endocardial borders. The left ventricular internal cavity size was mildly dilated. There is mild left ventricular hypertrophy. Left ventricular diastolic parameters are consistent with Grade II diastolic dysfunction (pseudonormalization). Right Ventricle: The right ventricular size is normal. No increase  in right ventricular wall thickness. Right ventricular systolic function is normal. There is normal pulmonary artery systolic pressure. The tricuspid regurgitant velocity is 2.00 m/s, and  with an assumed right atrial pressure of 3 mmHg, the estimated right ventricular systolic pressure is 19.0 mmHg. Left Atrium: Left atrial size was normal in size. Right Atrium: Right atrial size was normal in size. Pericardium: There is no evidence of pericardial effusion. Mitral Valve: The mitral valve is normal in structure. There is mild thickening of the mitral valve leaflet(s). Moderate mitral valve regurgitation. No evidence of mitral valve stenosis. MV peak gradient, 5.9 mmHg. The mean mitral valve gradient is 3.0 mmHg.  Tricuspid Valve: The tricuspid valve is normal in structure. Tricuspid valve regurgitation is not demonstrated. No evidence of tricuspid stenosis. Aortic Valve: The aortic valve is normal in structure. Aortic valve regurgitation is not visualized. No aortic stenosis is present. Aortic valve mean gradient measures 3.0 mmHg. Aortic valve peak gradient measures 4.9 mmHg. Aortic valve area, by VTI measures 1.91 cm. Pulmonic Valve: The pulmonic valve was normal in structure. Pulmonic valve regurgitation is not visualized. No evidence of pulmonic stenosis. Aorta: The aortic root is normal in size and structure. There is mild dilatation of the ascending aorta, measuring 42 mm. Venous: The inferior vena cava is normal in size with less than 50% respiratory variability, suggesting right atrial pressure of 8 mmHg. IAS/Shunts: No atrial level shunt detected by color flow Doppler.  LEFT VENTRICLE PLAX 2D LV EF:         Left            Diastology                ventricular     LV e' medial:    6.53 cm/s                ejection        LV E/e' medial:  16.8                fraction by     LV e' lateral:   6.74 cm/s                PLAX is 32      LV E/e' lateral: 16.3                %. LVIDd:         6.40 cm LVIDs:         5.40 cm LV PW:         1.30 cm LV IVS:        1.30 cm LVOT diam:     2.03 cm LV SV:         38 LV SV Index:   18 LVOT Area:     3.25 cm  LV Volumes (MOD) LV vol d, MOD    191.0 ml A2C: LV vol d, MOD    206.0 ml A4C: LV vol s, MOD    145.0 ml A2C: LV vol s, MOD    108.0 ml A4C: LV SV MOD A2C:   46.0 ml LV SV MOD A4C:   206.0 ml LV SV MOD BP:    72.7 ml RIGHT VENTRICLE             IVC RV Basal diam:  3.50 cm     IVC diam: 2.20 cm RV S prime:     10.30 cm/s TAPSE (M-mode): 2.6 cm LEFT ATRIUM  Index        RIGHT ATRIUM           Index LA diam:      4.30 cm 2.04 cm/m   RA Area:     17.70 cm LA Vol (A4C): 91.7 ml 43.47 ml/m  RA Volume:   43.90 ml  20.81 ml/m  AORTIC VALVE                    PULMONIC VALVE  AV Area (Vmax):    2.26 cm     PV Vmax:       0.85 m/s AV Area (Vmean):   2.15 cm     PV Peak grad:  2.9 mmHg AV Area (VTI):     1.91 cm AV Vmax:           110.50 cm/s AV Vmean:          76.100 cm/s AV VTI:            0.200 m AV Peak Grad:      4.9 mmHg AV Mean Grad:      3.0 mmHg LVOT Vmax:         77.00 cm/s LVOT Vmean:        50.300 cm/s LVOT VTI:          0.118 m LVOT/AV VTI ratio: 0.59  AORTA Ao Root diam: 4.15 cm Ao Asc diam:  4.20 cm MITRAL VALVE                  TRICUSPID VALVE MV Area (PHT): 5.13 cm       TR Peak grad:   16.0 mmHg MV Area VTI:   1.33 cm       TR Vmax:        200.00 cm/s MV Peak grad:  5.9 mmHg MV Mean grad:  3.0 mmHg       SHUNTS MV Vmax:       1.21 m/s       Systemic VTI:  0.12 m MV Vmean:      77.7 cm/s      Systemic Diam: 2.03 cm MV Decel Time: 148 msec MR Peak grad:    109.8 mmHg MR Mean grad:    68.0 mmHg MR Vmax:         524.00 cm/s MR Vmean:        385.0 cm/s MR PISA:         2.26 cm MR PISA Eff ROA: 16 mm MR PISA Radius:  0.60 cm MV E velocity: 110.00 cm/s MV A velocity: 61.80 cm/s MV E/A ratio:  1.78 Julien Nordmann MD Electronically signed by Julien Nordmann MD Signature Date/Time: 04/30/2023/1:02:34 PM    Final     Cardiac Studies     Patient Profile     Mr. Ronnie Mann is a 67 year old gentleman with long history of kidney stones, presenting April 28 2023 to the emergency room with left-sided chest pain radiating to left arm, chronic shortness of breath for more than 1 month with chest congestion   Assessment & Plan     1.  Non-ST elevation myocardial infarction: Status post balloon angioplasty of proximal LAD and drug-eluting stent placement to the proximal right coronary artery yesterday. The LAD was not stented due to diffuse disease in the mid and distal segment as well as sluggish flow distally likely due to decompensated heart failure. Continue dual antiplatelet therapy with aspirin and ticagrelor. The LAD might ultimately require stenting.  Recommend  relook angiography in the near future likely in few weeks as an outpatient.  This was discussed with the patient today.  2.  Acute systolic heart failure: This is due to ischemic cardiomyopathy.  Severely elevated LVEDP yesterday and flash pulmonary edema likely precipitated by worsening ischemia during PCI as well as worsening mitral regurgitation. Significant urine output since yesterday.  He appears to be euvolemic now and I am switching him to oral furosemide. Continue small dose carvedilol and spironolactone.  Will go ahead and add losartan with consideration to switch to [facility] before hospital discharge. No SGLT2 inhibitor due to urinary tract infection and kidney stones.  3.  Essential hypertension: Blood pressure is improving and he is off nitroglycerin drip.  4.  Moderate to severe mitral regurgitation: Likely ischemic and I suspect gradual improvement in this.  5.  Kidney stones with hydronephrosis and UTI.  Seen by urology with plans for conservative treatment at this time and consideration for outpatient ureteral stent placement.   The patient can be transferred to 2A.  Total encounter time more than 50 minutes Greater than 50% was spent in counseling and coordination of care with the patient   For questions or updates, please contact [facility] Please consult [email or web address] for contact info under        Signed, Lorine Bears, MD  05/02/2023, 9:34 AM

## 2023-05-02 NOTE — Progress Notes (Signed)
Urology Inpatient Progress Note  Subjective: Cardiac catheterization yesterday and developed flash pulmonary edema, now being diuresed. Urine culture dated 04/29/2023 has finalized with no growth.  Admission blood culture pending with no growth at 4 days.  On antibiotics as below. Renal function remains normal with creatinine 1.00 this morning. This morning he reports some improvement in his chest and upper extremity pain with no other significant changes.  Anti-infectives: Anti-infectives (From admission, onward)    Start     Dose/Rate Route Frequency Ordered Stop   04/29/23 1200  cefTRIAXone (ROCEPHIN) 2 g in sodium chloride 0.9 % 100 mL IVPB        2 g 200 mL/hr over 30 Minutes Intravenous Every 24 hours 04/29/23 0738 05/03/23 1159   04/29/23 1000  azithromycin (ZITHROMAX) 500 mg in sodium chloride 0.9 % 250 mL IVPB        500 mg 250 mL/hr over 60 Minutes Intravenous Every 24 hours 04/29/23 0738 05/03/23 0959   04/28/23 1200  cefTRIAXone (ROCEPHIN) 1 g in sodium chloride 0.9 % 100 mL IVPB        1 g 200 mL/hr over 30 Minutes Intravenous  Once 04/28/23 1148 04/28/23 1353   04/28/23 1200  azithromycin (ZITHROMAX) 500 mg in sodium chloride 0.9 % 250 mL IVPB        500 mg 250 mL/hr over 60 Minutes Intravenous  Once 04/28/23 1148 04/28/23 1409       Current Facility-Administered Medications  Medication Dose Route Frequency Provider Last Rate Last Admin   0.9 %  sodium chloride infusion  250 mL Intravenous PRN Iran Ouch, MD       acetaminophen (TYLENOL) tablet 650 mg  650 mg Oral Q6H PRN Iran Ouch, MD   650 mg at 04/30/23 1032   Or   acetaminophen (TYLENOL) suppository 650 mg  650 mg Rectal Q6H PRN Iran Ouch, MD       aspirin EC tablet 81 mg  81 mg Oral Daily Lorine Bears A, MD   81 mg at 04/30/23 1020   atorvastatin (LIPITOR) tablet 40 mg  40 mg Oral Daily Lorine Bears A, MD   40 mg at 05/01/23 2312   azithromycin (ZITHROMAX) 500 mg in sodium chloride 0.9  % 250 mL IVPB  500 mg Intravenous Q24H Iran Ouch, MD   Stopped at 05/01/23 1309   carvedilol (COREG) tablet 3.125 mg  3.125 mg Oral BID WC Lorine Bears A, MD   3.125 mg at 05/02/23 0807   cefTRIAXone (ROCEPHIN) 2 g in sodium chloride 0.9 % 100 mL IVPB  2 g Intravenous Q24H Iran Ouch, MD   Stopped at 05/01/23 1425   Chlorhexidine Gluconate Cloth 2 % PADS 6 each  6 each Topical Daily Lorine Bears A, MD   6 each at 05/01/23 1200   enoxaparin (LOVENOX) injection 40 mg  40 mg Subcutaneous Q24H Lorine Bears A, MD   40 mg at 05/02/23 0805   fentaNYL (SUBLIMAZE) injection 12.5-50 mcg  12.5-50 mcg Intravenous Q2H PRN Iran Ouch, MD   50 mcg at 05/02/23 0047   folic acid injection 1 mg  1 mg Intravenous Daily Lorine Bears A, MD   1 mg at 05/01/23 1203   furosemide (LASIX) injection 40 mg  40 mg Intravenous Once Clegg, Amy D, NP       [START ON 05/03/2023] furosemide (LASIX) injection 40 mg  40 mg Intravenous Daily Agbata, Tochukwu, MD       ipratropium (  ATROVENT) nebulizer solution 0.5 mg  0.5 mg Nebulization Q6H PRN Lorine Bears A, MD       nitroGLYCERIN 50 mg in dextrose 5 % 250 mL (0.2 mg/mL) infusion  0-200 mcg/min Intravenous Titrated Iran Ouch, MD 3 mL/hr at 05/01/23 1800 10 mcg/min at 05/01/23 1800   ondansetron (ZOFRAN) tablet 4 mg  4 mg Oral Q6H PRN Iran Ouch, MD       Or   ondansetron (ZOFRAN) injection 4 mg  4 mg Intravenous Q6H PRN Iran Ouch, MD       oxyCODONE (Oxy IR/ROXICODONE) immediate release tablet 5 mg  5 mg Oral Q4H PRN Iran Ouch, MD   5 mg at 05/02/23 0806   polyethylene glycol (MIRALAX / GLYCOLAX) packet 17 g  17 g Oral Daily PRN Lorine Bears A, MD       sodium chloride flush (NS) 0.9 % injection 3 mL  3 mL Intravenous Q12H Lorine Bears A, MD   3 mL at 04/30/23 1033   sodium chloride flush (NS) 0.9 % injection 3 mL  3 mL Intravenous Q12H Lorine Bears A, MD   3 mL at 04/30/23 2117   sodium chloride flush (NS)  0.9 % injection 3 mL  3 mL Intravenous Q12H Lorine Bears A, MD   3 mL at 04/30/23 2117   sodium chloride flush (NS) 0.9 % injection 3 mL  3 mL Intravenous Q12H Lorine Bears A, MD       sodium chloride flush (NS) 0.9 % injection 3 mL  3 mL Intravenous PRN Iran Ouch, MD       spironolactone (ALDACTONE) tablet 12.5 mg  12.5 mg Oral Daily Clegg, Amy D, NP   12.5 mg at 05/01/23 1710   thiamine (VITAMIN B1) injection 100 mg  100 mg Intravenous Daily Lorine Bears A, MD   100 mg at 05/01/23 1159   ticagrelor (BRILINTA) tablet 90 mg  90 mg Oral BID Lorine Bears A, MD   90 mg at 05/01/23 2312   Objective: Vital signs in last 24 hours: Temp:  [98 F (36.7 C)-98.7 F (37.1 C)] 98.4 F (36.9 C) (06/11 0400) Pulse Rate:  [66-118] 69 (06/11 0600) Resp:  [13-31] 20 (06/11 0400) BP: (85-149)/(55-88) 111/65 (06/11 0600) SpO2:  [83 %-100 %] 98 % (06/11 0757) FiO2 (%):  [40 %] 40 % (06/10 0945)  Intake/Output from previous day: 06/10 0701 - 06/11 0700 In: 494.8 [P.O.:120; I.V.:24.8; IV Piggyback:350] Out: 3725 [Urine:3725] Intake/Output this shift: Total I/O In: -  Out: 300 [Urine:300]  Physical Exam Vitals and nursing note reviewed.  Constitutional:      General: He is not in acute distress.    Appearance: He is not ill-appearing, toxic-appearing or diaphoretic.  HENT:     Head: Normocephalic and atraumatic.  Pulmonary:     Effort: Pulmonary effort is normal. No respiratory distress.  Skin:    General: Skin is warm and dry.  Neurological:     Mental Status: He is alert and oriented to person, place, and time.  Psychiatric:        Mood and Affect: Mood normal.        Behavior: Behavior normal.    Lab Results:  Recent Labs    05/01/23 0435 05/02/23 0413  WBC 7.3 6.5  HGB 10.3* 9.7*  HCT 31.8* 28.8*  PLT 160 157   BMET Recent Labs    04/30/23 1154 05/01/23 0434 05/02/23 0413  NA 142  --  143  K 4.2  --  3.7  CL 108  --  107  CO2 24  --  28  GLUCOSE 124*   --  120*  BUN 27*  --  28*  CREATININE 0.96 0.95 1.00  CALCIUM 8.6*  --  8.6*   Studies/Results: CARDIAC CATHETERIZATION  Result Date: 05/01/2023   Prox LAD to Mid LAD lesion is 99% stenosed.   Mid LAD lesion is 60% stenosed.   1st Diag lesion is 70% stenosed.   Prox RCA lesion is 95% stenosed.   Mid RCA lesion is 30% stenosed.   RPDA lesion is 60% stenosed.   Dist LAD lesion is 99% stenosed.   A drug-eluting stent was successfully placed using a STENT ONYX FRONTIER 4.0X15.   Balloon angioplasty was performed using a BALLN [place] EUPHORA RX 2.75X20.   Post intervention, there is a 20% residual stenosis.   Post intervention, there is a 0% residual stenosis.   There is severe left ventricular systolic dysfunction.   LV end diastolic pressure is severely elevated.   There is moderate (3+) mitral regurgitation. 1.  Severe two-vessel coronary artery disease with subtotal occlusion of the proximal LAD, diffuse moderate mid LAD disease and severe distal LAD disease.  In addition, there is 95% in the proximal right coronary artery with faint right to left collaterals to the LAD.  The left circumflex has mild nonobstructive disease and OM 3 is large and reaches the apex. 2.  Severely reduced LV systolic function with an EF of 25%.  Severely elevated left ventricular end-diastolic pressure at 34 mmHg. 3.  Successful balloon angioplasty to the proximal LAD and drug-eluting stent placement to the proximal right coronary artery. Recommendations: The patient developed progressive pulmonary edema as the case progressed.  He was given 1 dose of IV furosemide and was started on nitroglycerin drip.  He will be started on BiPAP. I elected not to place a stent in the LAD given poor distal flow and diffuse disease overall.  In addition, placing a stent would risk closing the first diagonal. Recommend optimizing his heart failure and volume overload and consider relook angiography in the near future once he is optimized. I consulted  advanced heart failure.   ECHOCARDIOGRAM COMPLETE  Result Date: 04/30/2023    ECHOCARDIOGRAM REPORT   Patient Name:   Ronnie Mann Date of Exam: 04/30/2023 Medical Rec #:  [id number]    Height:       69.0 in Accession #:    [id number]   Weight:       210.0 lb Date of Birth:  1956/05/10    BSA:          2.109 m Patient Age:    11 years     BP:           103/76 mmHg Patient Gender: M            HR:           75 bpm. Exam Location:  [facility] Procedure: 2D Echo, Color Doppler, Cardiac Doppler and Intracardiac            Opacification Agent Indications:     R07.9 Chest Pain  History:         Patient has no prior history of Echocardiogram examinations. No                  cardiac history on file.  Sonographer:     L. Thornton-Maynard Referring Phys:  [id number] Shelbie Proctor PRATT Diagnosing  Phys: Julien Nordmann MD  Sonographer Comments: Suboptimal apical window. IMPRESSIONS  1. Left ventricular ejection fraction, by estimation, is 30 to 35%. Left ventricular ejection fraction by PLAX is 32 %. The left ventricle has moderately decreased function. The left ventricle demonstrates global hypokinesis with severe hypokinesis of the anterior, anteroseptal and apical region. The left ventricular internal cavity size was mildly dilated. There is mild left ventricular hypertrophy. Left ventricular diastolic parameters are consistent with Grade II diastolic dysfunction (pseudonormalization).  2. Right ventricular systolic function is normal. The right ventricular size is normal. There is normal pulmonary artery systolic pressure.  3. The mitral valve is normal in structure. Moderate mitral valve regurgitation. No evidence of mitral stenosis.  4. The aortic valve is normal in structure. Aortic valve regurgitation is not visualized. No aortic stenosis is present.  5. There is mild dilatation of the ascending aorta, measuring 42 mm.  6. The inferior vena cava is normal in size with <50% respiratory variability, suggesting right atrial pressure of 8  mmHg. FINDINGS  Left Ventricle: Left ventricular ejection fraction, by estimation, is 30 to 35%. Left ventricular ejection fraction by PLAX is 32 %. The left ventricle has moderately decreased function. The left ventricle demonstrates global hypokinesis. Definity contrast agent was given IV to delineate the left ventricular endocardial borders. The left ventricular internal cavity size was mildly dilated. There is mild left ventricular hypertrophy. Left ventricular diastolic parameters are consistent with Grade II diastolic dysfunction (pseudonormalization). Right Ventricle: The right ventricular size is normal. No increase in right ventricular wall thickness. Right ventricular systolic function is normal. There is normal pulmonary artery systolic pressure. The tricuspid regurgitant velocity is 2.00 m/s, and  with an assumed right atrial pressure of 3 mmHg, the estimated right ventricular systolic pressure is 19.0 mmHg. Left Atrium: Left atrial size was normal in size. Right Atrium: Right atrial size was normal in size. Pericardium: There is no evidence of pericardial effusion. Mitral Valve: The mitral valve is normal in structure. There is mild thickening of the mitral valve leaflet(s). Moderate mitral valve regurgitation. No evidence of mitral valve stenosis. MV peak gradient, 5.9 mmHg. The mean mitral valve gradient is 3.0 mmHg. Tricuspid Valve: The tricuspid valve is normal in structure. Tricuspid valve regurgitation is not demonstrated. No evidence of tricuspid stenosis. Aortic Valve: The aortic valve is normal in structure. Aortic valve regurgitation is not visualized. No aortic stenosis is present. Aortic valve mean gradient measures 3.0 mmHg. Aortic valve peak gradient measures 4.9 mmHg. Aortic valve area, by VTI measures 1.91 cm. Pulmonic Valve: The pulmonic valve was normal in structure. Pulmonic valve regurgitation is not visualized. No evidence of pulmonic stenosis. Aorta: The aortic root is normal in  size and structure. There is mild dilatation of the ascending aorta, measuring 42 mm. Venous: The inferior vena cava is normal in size with less than 50% respiratory variability, suggesting right atrial pressure of 8 mmHg. IAS/Shunts: No atrial level shunt detected by color flow Doppler.  LEFT VENTRICLE PLAX 2D LV EF:         Left            Diastology                ventricular     LV e' medial:    6.53 cm/s                ejection        LV E/e' medial:  16.8  fraction by     LV e' lateral:   6.74 cm/s                PLAX is 32      LV E/e' lateral: 16.3                %. LVIDd:         6.40 cm LVIDs:         5.40 cm LV PW:         1.30 cm LV IVS:        1.30 cm LVOT diam:     2.03 cm LV SV:         38 LV SV Index:   18 LVOT Area:     3.25 cm  LV Volumes (MOD) LV vol d, MOD    191.0 ml A2C: LV vol d, MOD    206.0 ml A4C: LV vol s, MOD    145.0 ml A2C: LV vol s, MOD    108.0 ml A4C: LV SV MOD A2C:   46.0 ml LV SV MOD A4C:   206.0 ml LV SV MOD BP:    72.7 ml RIGHT VENTRICLE             IVC RV Basal diam:  3.50 cm     IVC diam: 2.20 cm RV S prime:     10.30 cm/s TAPSE (M-mode): 2.6 cm LEFT ATRIUM           Index        RIGHT ATRIUM           Index LA diam:      4.30 cm 2.04 cm/m   RA Area:     17.70 cm LA Vol (A4C): 91.7 ml 43.47 ml/m  RA Volume:   43.90 ml  20.81 ml/m  AORTIC VALVE                    PULMONIC VALVE AV Area (Vmax):    2.26 cm     PV Vmax:       0.85 m/s AV Area (Vmean):   2.15 cm     PV Peak grad:  2.9 mmHg AV Area (VTI):     1.91 cm AV Vmax:           110.50 cm/s AV Vmean:          76.100 cm/s AV VTI:            0.200 m AV Peak Grad:      4.9 mmHg AV Mean Grad:      3.0 mmHg LVOT Vmax:         77.00 cm/s LVOT Vmean:        50.300 cm/s LVOT VTI:          0.118 m LVOT/AV VTI ratio: 0.59  AORTA Ao Root diam: 4.15 cm Ao Asc diam:  4.20 cm MITRAL VALVE                  TRICUSPID VALVE MV Area (PHT): 5.13 cm       TR Peak grad:   16.0 mmHg MV Area VTI:   1.33 cm       TR Vmax:         200.00 cm/s MV Peak grad:  5.9 mmHg MV Mean grad:  3.0 mmHg       SHUNTS MV Vmax:       1.21 m/s  Systemic VTI:  0.12 m MV Vmean:      77.7 cm/s      Systemic Diam: 2.03 cm MV Decel Time: 148 msec MR Peak grad:    109.8 mmHg MR Mean grad:    68.0 mmHg MR Vmax:         524.00 cm/s MR Vmean:        385.0 cm/s MR PISA:         2.26 cm MR PISA Eff ROA: 16 mm MR PISA Radius:  0.60 cm MV E velocity: 110.00 cm/s MV A velocity: 61.80 cm/s MV E/A ratio:  1.78 Julien Nordmann MD Electronically signed by Julien Nordmann MD Signature Date/Time: 04/30/2023/1:02:34 PM    Final     Assessment & Plan: 67 year old male with history of nephrolithiasis who admitted with acute systolic CHF and NSTEMI s/p cardiac catheterization yesterday with 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  In light of negative urine culture and stable renal function, we will plan to defer ureteral stenting during his current admission.  No plans for urologic procedure at this time.  Okay to continue antibiotics per primary team if they feel this is appropriate.  I will schedule him for outpatient follow-up with Dr. Richardo Hanks to discuss definitive stone management.  Carman Ching, PA-C 05/02/2023

## 2023-05-03 DIAGNOSIS — I255 Ischemic cardiomyopathy: Secondary | ICD-10-CM

## 2023-05-03 DIAGNOSIS — I5021 Acute systolic (congestive) heart failure: Secondary | ICD-10-CM | POA: Diagnosis not present

## 2023-05-03 LAB — GLUCOSE, CAPILLARY
Glucose-Capillary: 114 mg/dL — ABNORMAL HIGH (ref 70–99)
Glucose-Capillary: 173 mg/dL — ABNORMAL HIGH (ref 70–99)
Glucose-Capillary: 173 mg/dL — ABNORMAL HIGH (ref 70–99)

## 2023-05-03 LAB — CULTURE, BLOOD (ROUTINE X 2): Culture: NO GROWTH

## 2023-05-03 LAB — LIPOPROTEIN A (LPA): Lipoprotein (a): 11.4 nmol/L (ref <75.0)

## 2023-05-03 MED ORDER — FUROSEMIDE 10 MG/ML IJ SOLN
40.0000 mg | Freq: Once | INTRAMUSCULAR | Status: AC
  Administered 2023-05-03: 40 mg via INTRAVENOUS
  Filled 2023-05-03: qty 4

## 2023-05-03 MED ORDER — TAMSULOSIN HCL 0.4 MG PO CAPS
0.4000 mg | ORAL_CAPSULE | Freq: Every day | ORAL | Status: DC
  Administered 2023-05-03 – 2023-05-06 (×4): 0.4 mg via ORAL
  Filled 2023-05-03 (×4): qty 1

## 2023-05-03 MED ORDER — SACUBITRIL-VALSARTAN 24-26 MG PO TABS
1.0000 | ORAL_TABLET | Freq: Two times a day (BID) | ORAL | Status: DC
  Administered 2023-05-03 – 2023-05-04 (×3): 1 via ORAL
  Filled 2023-05-03 (×3): qty 1

## 2023-05-03 MED ORDER — POTASSIUM CHLORIDE CRYS ER 20 MEQ PO TBCR
40.0000 meq | EXTENDED_RELEASE_TABLET | Freq: Once | ORAL | Status: AC
  Administered 2023-05-03: 40 meq via ORAL
  Filled 2023-05-03: qty 2

## 2023-05-03 MED ORDER — ALPRAZOLAM 0.5 MG PO TABS
0.5000 mg | ORAL_TABLET | Freq: Three times a day (TID) | ORAL | Status: DC | PRN
  Administered 2023-05-03 – 2023-05-04 (×2): 0.5 mg via ORAL
  Filled 2023-05-03 (×2): qty 1

## 2023-05-03 MED ORDER — DAPAGLIFLOZIN PROPANEDIOL 10 MG PO TABS: 10.0000 mg | ORAL_TABLET | Freq: Every day | ORAL | Status: DC

## 2023-05-03 NOTE — Progress Notes (Signed)
Progress Note  Patient Name: Ronnie Mann Date of Encounter: 05/03/2023  Primary Cardiologist: New - consult by Gollan  Subjective   No chest pain or dyspnea. Left arm pain resolved. Still with some flank pain. Weaned to room air.   Inpatient Medications    Scheduled Meds:  aspirin EC  81 mg Oral Daily   atorvastatin  40 mg Oral Daily   carvedilol  3.125 mg Oral BID WC   Chlorhexidine Gluconate Cloth  6 each Topical Daily   enoxaparin (LOVENOX) injection  40 mg Subcutaneous Q24H   folic acid  1 mg Intravenous Daily   furosemide  40 mg Intravenous Once   furosemide  40 mg Intravenous Once   potassium chloride  40 mEq Oral Once   sacubitril-valsartan  1 tablet Oral BID   sodium chloride flush  3 mL Intravenous Q12H   spironolactone  12.5 mg Oral Daily   tamsulosin  0.4 mg Oral QPC breakfast   thiamine (VITAMIN B1) injection  100 mg Intravenous Daily   ticagrelor  90 mg Oral BID   Continuous Infusions:  PRN Meds: acetaminophen **OR** acetaminophen, fentaNYL (SUBLIMAZE) injection, ipratropium, LORazepam, melatonin, ondansetron **OR** ondansetron (ZOFRAN) IV, oxyCODONE, polyethylene glycol   Vital Signs    Vitals:   05/03/23 0600 05/03/23 0700 05/03/23 0759 05/03/23 0800  BP:   (!) 145/72 (!) 137/105  Pulse: 66 74 87 84  Resp: 20 20    Temp:    98 F (36.7 C)  TempSrc:      SpO2: 100% 95%  99%  Weight:      Height:        Intake/Output Summary (Last 24 hours) at 05/03/2023 0903 Last data filed at 05/03/2023 0523 Gross per 24 hour  Intake 646 ml  Output 750 ml  Net -104 ml   Filed Weights   04/28/23 1112  Weight: 95.3 kg    Telemetry    SR - Personally Reviewed  ECG    No new tracings - Personally Reviewed  Physical Exam   GEN: No acute distress.   Neck: No JVD. Cardiac: RRR, no murmurs, rubs, or gallops.  Respiratory: Faint crackles along the bases bilaterally.  GI: Soft, nontender, non-distended.   MS: No edema; No deformity. Neuro:  Alert  and oriented x 3; Nonfocal.  Psych: Normal affect.  Labs    Chemistry Recent Labs  Lab 04/28/23 1130 04/29/23 0507 04/30/23 1154 05/01/23 0434 05/02/23 0413  NA 138 139 142  --  143  K 4.1 4.4 4.2  --  3.7  CL 104 108 108  --  107  CO2 22 22 24   --  28  GLUCOSE 293* 146* 124*  --  120*  BUN 30* 27* 27*  --  28*  CREATININE 1.21 0.93 0.96 0.95 1.00  CALCIUM 9.8 8.8* 8.6*  --  8.6*  PROT 7.7 7.1  --   --   --   ALBUMIN 4.2 4.1  --   --   --   AST 32 122*  --   --   --   ALT 22 30  --   --   --   ALKPHOS 101 93  --   --   --   BILITOT 0.8 1.1  --   --   --   GFRNONAA >60 >60 >60 >60 >60  ANIONGAP 12 9 10   --  8     Hematology Recent Labs  Lab 04/30/23 0615 05/01/23 0435 05/02/23 0413  WBC 7.6 7.3 6.5  RBC 3.23* 3.19* 2.92*  HGB 10.5* 10.3* 9.7*  HCT 31.6* 31.8* 28.8*  MCV 97.8 99.7 98.6  MCH 32.5 32.3 33.2  MCHC 33.2 32.4 33.7  RDW 13.7 13.5 13.5  PLT 164 160 157    Cardiac EnzymesNo results for input(s): "TROPONINI" in the last 168 hours. No results for input(s): "TROPIPOC" in the last 168 hours.   BNP Recent Labs  Lab 04/28/23 1130  BNP 530.9*     DDimer No results for input(s): "DDIMER" in the last 168 hours.   Radiology    No results found.  Cardiac Studies   LHC 05/01/2023:   Prox LAD to Mid LAD lesion is 99% stenosed.   Mid LAD lesion is 60% stenosed.   1st Diag lesion is 70% stenosed.   Prox RCA lesion is 95% stenosed.   Mid RCA lesion is 30% stenosed.   RPDA lesion is 60% stenosed.   Dist LAD lesion is 99% stenosed.   A drug-eluting stent was successfully placed using a STENT ONYX FRONTIER 4.0X15.   Balloon angioplasty was performed using a BALLN [place] EUPHORA RX 2.75X20.   Post intervention, there is a 20% residual stenosis.   Post intervention, there is a 0% residual stenosis.   There is severe left ventricular systolic dysfunction.   LV end diastolic pressure is severely elevated.   There is moderate (3+) mitral regurgitation.   1.   Severe two-vessel coronary artery disease with subtotal occlusion of the proximal LAD, diffuse moderate mid LAD disease and severe distal LAD disease.  In addition, there is 95% in the proximal right coronary artery with faint right to left collaterals to the LAD.  The left circumflex has mild nonobstructive disease and OM 3 is large and reaches the apex. 2.  Severely reduced LV systolic function with an EF of 25%.  Severely elevated left ventricular end-diastolic pressure at 34 mmHg. 3.  Successful balloon angioplasty to the proximal LAD and drug-eluting stent placement to the proximal right coronary artery.   Recommendations: The patient developed progressive pulmonary edema as the case progressed.  He was given 1 dose of IV furosemide and was started on nitroglycerin drip.  He will be started on BiPAP. I elected not to place a stent in the LAD given poor distal flow and diffuse disease overall.  In addition, placing a stent would risk closing the first diagonal. Recommend optimizing his heart failure and volume overload and consider relook angiography in the near future once he is optimized. I consulted advanced heart failure. __________  2D echo 04/30/2023: 1. Left ventricular ejection fraction, by estimation, is 30 to 35%. Left  ventricular ejection fraction by PLAX is 32 %. The left ventricle has  moderately decreased function. The left ventricle demonstrates global  hypokinesis with severe hypokinesis of  the anterior, anteroseptal and apical region. The left ventricular  internal cavity size was mildly dilated. There is mild left ventricular  hypertrophy. Left ventricular diastolic parameters are consistent with  Grade II diastolic dysfunction  (pseudonormalization).   2. Right ventricular systolic function is normal. The right ventricular  size is normal. There is normal pulmonary artery systolic pressure.   3. The mitral valve is normal in structure. Moderate mitral valve   regurgitation. No evidence of mitral stenosis.   4. The aortic valve is normal in structure. Aortic valve regurgitation is  not visualized. No aortic stenosis is present.   5. There is mild dilatation of the ascending aorta, measuring 42  mm.   6. The inferior vena cava is normal in size with <50% respiratory  variability, suggesting right atrial pressure of 8 mmHg.   Patient Profile     67 y.o. male with history of long history of kidney stones, presenting April 28 2023 to the emergency room with left-sided chest pain radiating to left arm, chronic shortness of breath for more than 1 month with chest congestion and found to have acute HFrEF, NSTEMI, and moderate to severe mitral regurgitation.   Assessment & Plan    1. Non-ST elevation myocardial infarction: Status post balloon angioplasty of proximal LAD and drug-eluting stent placement to the proximal right coronary artery 6/10.  The LAD was not stented due to diffuse disease in the mid and distal segment as well as sluggish flow distally likely due to decompensated heart failure.  Continue dual antiplatelet therapy with aspirin and ticagrelor.  The LAD might ultimately require stenting.  Recommend relook angiography in the near future likely in few weeks as an outpatient.  Post cath instructions.  Cardiac rehab.   2.  Acute systolic heart failure: This is due to ischemic cardiomyopathy.  Severely elevated LVEDP 6/10 and flash pulmonary edema likely precipitated by worsening ischemia during PCI as well as worsening mitral regurgitation.  Significant urine output with improvement in dyspnea, now on room air.  Transition losartan to Entresto.  Give a one-time dose of IV furosemide.  Continue small dose carvedilol and spironolactone.  No SGLT2 inhibitor due to urinary tract infection and kidney stones.   3.  Essential hypertension: Blood pressure remains elevated.  Medical therapy as outlined above.   4.  Moderate to severe mitral regurgitation:  Likely ischemic, suspected to have gradual improvement in this.   5.  Kidney stones with hydronephrosis and UTI.  Seen by urology with plans for conservative treatment at this time and consideration for outpatient ureteral stent placement.    For questions or updates, please contact [facility] Please consult [email or web address] for contact info under Cardiology/STEMI.    Signed, Eula Listen, PA-C [facility] Pager: [phone] 05/03/2023, 9:03 AM

## 2023-05-03 NOTE — Progress Notes (Addendum)
Attending Note Patient seen and examined, agree with detailed note above,   Patient presentation and plan discussed on rounds.    EKG lab work, chest x-ray, echocardiogram reviewed independently by myself  Patient seen on rounds this morning,  Resting comfortably in bed, no complaints, feels he still has a little bit of fluid in the lungs Denies significant leg swelling, no chest pain, slight right flank sacroiliac pain Discussed recent cardiac catheterization details  On examination : alert oriented, JVD 8, lungs disease, heart sounds regular normal S1-S2 no murmurs appreciated, abdomen soft nontender no significant lower extremity edema.  Musculoskeletal exam with good range of motion, neurologic exam grossly nonfocal     Latest Ref Rng & Units 05/02/2023    4:13 AM 05/01/2023    4:34 AM 04/30/2023   11:54 AM  BMP  Glucose 70 - 99 mg/dL 161   096   BUN 8 - 23 mg/dL 28   27   Creatinine 0.45 - 1.24 mg/dL 4.09  8.11  9.14   Sodium 135 - 145 mmol/L 143   142   Potassium 3.5 - 5.1 mmol/L 3.7   4.2   Chloride 98 - 111 mmol/L 107   108   CO2 22 - 32 mmol/L 28   24   Calcium 8.9 - 10.3 mg/dL 8.6   8.6       Latest Ref Rng & Units 05/02/2023    4:13 AM 05/01/2023    4:35 AM 04/30/2023    6:15 AM  CBC  WBC 4.0 - 10.5 K/uL 6.5  7.3  7.6   Hemoglobin 13.0 - 17.0 g/dL 9.7  78.2  95.6   Hematocrit 39.0 - 52.0 % 28.8  31.8  31.6   Platelets 150 - 400 K/uL 157  160  164    A/P: Non-STEMI Catheterization performed May 01, 2023 with subtotal occlusion of the proximal LAD and diffuse mid to distal LAD disease as well as 95% stenosis in the proximal right coronary artery. His ejection fraction was 25% with an LVEDP of 34 mmHg.  -->successful balloon angioplasty to proximal LAD and drug-eluting stent placement to the proximal right coronary artery.  -- developed flash pulmonary edema in the catheterization lab and was started on IV nitroglycerin, IV Lasix and BiPAP.  -He has responded to IV Lasix,  -8.8 L Additional dose IV Lasix today -Transition from losartan to Entresto 24/26 mg twice daily Continue Coreg 3.125 twice daily Continue spironolactone 12.5 daily SGLT2 inhibitor on hold in the setting of urinary tract infection Advanced heart failure team consulted  Ischemic cardiomyopathy Prior to cardiac catheterization, ejection fraction estimated 30 to 35% with moderate MR Medication changes as above  Kidney stones with hydronephrosis, UTI Outpatient follow-up with urology for consideration of stenting Treatment of UTI  Essential hypertension Consider titration up on Entresto if blood pressure permits tomorrow  Signed, Dossie Arbour, MD, Ph.D [facility]

## 2023-05-03 NOTE — Progress Notes (Addendum)
0800 Patient very excited. Stated he slept well last night. No complains of pain or discomfort. 1000 States he has flank pain bilaterally. Given prn Oxycodone for pain.  [id number] States he is having a panic attack. Oxygen saturation 100%. Respiratory rate 12. Also states he cannot breathe. Given prn Ativan for anxiety. 1552 Checked on patient. Still complaining of shortness of breath and anxiety. Given prn Fentanyl IVP. Patient crying because he is anxious. 1630 Checked on patient. Still states he is anxious.B/P 119/77 heart rate 90 Oxygen saturation is 98%. Informed he is well enough to move to 2A room 256. 1630 Report called to [employer] on 2A 1640 Transferred to 256 via wheelchair.

## 2023-05-03 NOTE — Progress Notes (Signed)
PROGRESS NOTE    Ronnie Mann  ZOX:[id number] DOB: [birth year] DOA: 04/28/2023 PCP: Oneita Hurt, No    Brief Narrative:  Ronnie Mann is a 67 y.o. male with medical history significant of  nephrolithiasis requiring multiple surgeries and lithotripsies in the past.  He reports right-sided kidney pain that has been ongoing and related to that for a long time.  He also reports URI congestion and runny nose for the last week prior to admission.  He reports new onset of substernal "vice like" substernal chest pain that radiated to his left arm.  He reports ongoing shortness of breath associated with this.  He also was awoken from sleep with this last night and had broken out into a cold sweat.  In the ED he was noted to have an elevated lactic acid and elevated WBC at 14.4.  He was given a liter of IV fluids.  Chest x-ray was done and showed pulmonary edema and his BNP said came back at 530.  He was not given further IV fluid boluses.  His initial troponin was 54 rose to 234.  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  Additionally patient was noted to have a CBG of 298 and did report polyuria and polydipsia at home.    Assessment & Plan:   Principal Problem:   CHF (congestive heart failure) (HCC) Active Problems:   Elevated troponin   Substernal chest pain   Staghorn calculus   Lactic acidosis   AKI (acute kidney injury) (HCC)   Hydronephrosis of left kidney   Wheezing   Essential hypertension   Hiatal hernia   Cholelithiasis   Shortness of breath   Viral URI   Non-ST elevation (NSTEMI) myocardial infarction (HCC)   Acute systolic heart failure (HCC)   Acute hyperglycemia   Ischemic cardiomyopathy  Acute systolic CHF (congestive  heart failure) (HCC) Secondary to ischemic cardiomyopathy Ejection fraction 30 to 35% Developed flash pulmonary edema during left heart cath 06/10 and was treated with nitroglycerin drip Plan: Continue IV diuresis.  Patient making good progress.  Hopeful to discharge 6/13  Acute non-ST elevation MI Patient presented for evaluation of chest pain over the left anterior chest wall with radiation to the left arm Status post successful balloon angioplasty to the proximal LAD and drug-eluting stent placement to the proximal right coronary artery. Plan: Continue aspirin, high intensity statin, carvedilol and Brilinta   History of nephrolithiasis/left hydronephrosis/staghorn calculi Sepsis from a urinary source Patient noted to have significant pyuria, tachycardia, tachypnea, lactic acidosis and leukocytosis Urine culture is sterile Patient was treated empirically with Rocephin No plans for inpatient urologic intervention Follow-up with urology as an outpatient   Community-acquired pneumonia Completed IV Rocephin and Zithromax   Hyperglycemia in the setting of acute non-ST elevation MI No known history of diabetes mellitus Random CBG of 298 on admission Started on insulin Hemoglobin A1c is 5.5 Will discontinue insulin due to increased risk for hypoglycemia Maintain consistent carbohydrate diet Will discharge on metformin   DVT prophylaxis: SQ Lovenox Code Status: Full Family Communication: None Disposition Plan: Status is: Inpatient Remains inpatient appropriate because: Decompensated heart failure   Level of care: Progressive  Consultants:  Heart failure/cardiology  Procedures:  Cath  Antimicrobials: None  Subjective: Seen and examined sitting up on bed.  Feeling well overall.  Objective: Vitals:   05/03/23 1430 05/03/23 1500 05/03/23 1530 05/03/23 1612  BP:    119/77  Pulse: (!) 101 82 (!) 110 (!) 110  Resp:  16 (!) 27   Temp:      TempSrc:      SpO2: 97%  97% 100%   Weight:      Height:        Intake/Output Summary (Last 24 hours) at 05/03/2023 1658 Last data filed at 05/03/2023 1645 Gross per 24 hour  Intake 260 ml  Output 2751 ml  Net -2491 ml   Filed Weights   04/28/23 1112  Weight: 95.3 kg    Examination:  General exam: Appears calm and comfortable  Respiratory system: Bibasilar rales.  Normal work of breathing.  Room air Cardiovascular system: S1-S2, RRR, no murmurs, trace pedal edema Gastrointestinal system: Soft, NT/ND, normal bowel sounds Central nervous system: Alert and oriented. No focal neurological deficits. Extremities: Symmetric 5 x 5 power. Skin: No rashes, lesions or ulcers Psychiatry: Judgement and insight appear normal. Mood & affect appropriate.     Data Reviewed: I have personally reviewed following labs and imaging studies  CBC: Recent Labs  Lab 04/28/23 1130 04/29/23 0507 04/30/23 0615 05/01/23 0435 05/02/23 0413  WBC 14.4* 10.9* 7.6 7.3 6.5  HGB 12.8* 11.6* 10.5* 10.3* 9.7*  HCT 39.8 35.3* 31.6* 31.8* 28.8*  MCV 100.0 98.6 97.8 99.7 98.6  PLT 255 182 164 160 157   Basic Metabolic Panel: Recent Labs  Lab 04/28/23 1130 04/29/23 0507 04/30/23 0615 04/30/23 1154 05/01/23 0434 05/02/23 0413  NA 138 139  --  142  --  143  K 4.1 4.4  --  4.2  --  3.7  CL 104 108  --  108  --  107  CO2 22 22  --  24  --  28  GLUCOSE 293* 146*  --  124*  --  120*  BUN 30* 27*  --  27*  --  28*  CREATININE 1.21 0.93  --  0.96 0.95 1.00  CALCIUM 9.8 8.8*  --  8.6*  --  8.6*  MG  --   --  2.0  --   --   --   PHOS  --   --  2.7  --   --   --    GFR: Estimated Creatinine Clearance: 81.6 mL/min (by C-G formula based on SCr of 1 mg/dL). Liver Function Tests: Recent Labs  Lab 04/28/23 1130 04/29/23 0507  AST 32 122*  ALT 22 30  ALKPHOS 101 93  BILITOT 0.8 1.1  PROT 7.7 7.1  ALBUMIN 4.2 4.1   Recent Labs  Lab 04/28/23 1130  LIPASE 29   No results for input(s): "AMMONIA" in the last 168  hours. Coagulation Profile: Recent Labs  Lab 04/29/23 0507  INR 1.1   Cardiac Enzymes: No results for input(s): "CKTOTAL", "CKMB", "CKMBINDEX", "TROPONINI" in the last 168 hours. BNP (last 3 results) No results for input(s): "PROBNP" in the last 8760 hours. HbA1C: No results for input(s): "HGBA1C" in the last 72 hours. CBG: Recent Labs  Lab 05/02/23 1121 05/02/23 1632 05/03/23 0723 05/03/23 1140 05/03/23 1623  GLUCAP 216* 168* 114* 173* 173*   Lipid Profile: No results for input(s): "CHOL", "HDL", "LDLCALC", "TRIG", "CHOLHDL", "LDLDIRECT" in the last 72 hours. Thyroid Function Tests: No results for input(s): "TSH", "T4TOTAL", "FREET4", "T3FREE", "THYROIDAB" in the last 72 hours. Anemia  Panel: No results for input(s): "VITAMINB12", "FOLATE", "FERRITIN", "TIBC", "IRON", "RETICCTPCT" in the last 72 hours. Sepsis Labs: Recent Labs  Lab 04/28/23 1130 04/28/23 1347  LATICACIDVEN 2.5* 2.6*    Recent Results (from the past 240 hour(s))  Blood culture (routine x 2)     Status: None   Collection Time: 04/28/23 11:30 AM   Specimen: BLOOD  Result Value Ref Range Status   Specimen Description BLOOD BLOOD LEFT FOREARM  Final   Special Requests   Final    BOTTLES DRAWN AEROBIC AND ANAEROBIC Blood Culture adequate volume   Culture   Final    NO GROWTH 5 DAYS Performed at [facility], [address] [place], [place] [address]    Report Status 05/03/2023 FINAL  Final  Blood culture (routine x 2)     Status: None   Collection Time: 04/28/23 11:30 AM   Specimen: BLOOD  Result Value Ref Range Status   Specimen Description BLOOD BLOOD RIGHT FOREARM  Final   Special Requests   Final    BOTTLES DRAWN AEROBIC AND ANAEROBIC Blood Culture results may not be optimal due to an excessive volume of blood received in culture bottles   Culture   Final    NO GROWTH 5 DAYS Performed at [facility], [address], [place], [place] [address]    Report Status 05/03/2023  FINAL  Final  SARS Coronavirus 2 by RT PCR (hospital order, performed in [facility] hospital lab) *cepheid single result test* Anterior Nasal Swab     Status: None   Collection Time: 04/28/23 12:09 PM   Specimen: Anterior Nasal Swab  Result Value Ref Range Status   SARS Coronavirus 2 by RT PCR NEGATIVE NEGATIVE Final    Comment: (NOTE) SARS-CoV-2 target nucleic acids are NOT DETECTED.  The SARS-CoV-2 RNA is generally detectable in upper and lower respiratory specimens during the acute phase of infection. The lowest concentration of SARS-CoV-2 viral copies this assay can detect is 250 copies / mL. A negative result does not preclude SARS-CoV-2 infection and should not be used as the sole basis for treatment or other patient management decisions.  A negative result may occur with improper specimen collection / handling, submission of specimen other than nasopharyngeal swab, presence of viral mutation(s) within the areas targeted by this assay, and inadequate number of viral copies (<250 copies / mL). A negative result must be combined with clinical observations, patient history, and epidemiological information.  Fact Sheet for Patients:   RoadLapTop.co.za  Fact Sheet for Healthcare Providers: http://kim-miller.com/  This test is not yet approved or  cleared by the [place] FDA and has been authorized for detection and/or diagnosis of SARS-CoV-2 by FDA under an Emergency Use Authorization (EUA).  This EUA will remain in effect (meaning this test can be used) for the duration of the COVID-19 declaration under Section 564(b)(1) of the Act, 21 U.S.C. section 360bbb-3(b)(1), unless the authorization is terminated or revoked sooner.  Performed at [facility], [address] [place], [place] [address]   Respiratory (~20 pathogens) panel by PCR     Status: None   Collection Time: 04/28/23  1:24 PM   Specimen: Nasopharyngeal  Swab; Respiratory  Result Value Ref Range Status   Adenovirus NOT DETECTED NOT DETECTED Final   Coronavirus 229E NOT DETECTED NOT DETECTED Final    Comment: (NOTE) The Coronavirus on the Respiratory Panel, DOES NOT test for the novel  Coronavirus (2019 nCoV)    Coronavirus HKU1 NOT  DETECTED NOT DETECTED Final   Coronavirus NL63 NOT DETECTED NOT DETECTED Final   Coronavirus OC43 NOT DETECTED NOT DETECTED Final   Metapneumovirus NOT DETECTED NOT DETECTED Final   Rhinovirus / Enterovirus NOT DETECTED NOT DETECTED Final   Influenza A NOT DETECTED NOT DETECTED Final   Influenza B NOT DETECTED NOT DETECTED Final   Parainfluenza Virus 1 NOT DETECTED NOT DETECTED Final   Parainfluenza Virus 2 NOT DETECTED NOT DETECTED Final   Parainfluenza Virus 3 NOT DETECTED NOT DETECTED Final   Parainfluenza Virus 4 NOT DETECTED NOT DETECTED Final   Respiratory Syncytial Virus NOT DETECTED NOT DETECTED Final   Bordetella pertussis NOT DETECTED NOT DETECTED Final   Bordetella Parapertussis NOT DETECTED NOT DETECTED Final   Chlamydophila pneumoniae NOT DETECTED NOT DETECTED Final   Mycoplasma pneumoniae NOT DETECTED NOT DETECTED Final    Comment: Performed at [facility], [address] [place], [place] [address]  MRSA Next Gen by PCR, Nasal     Status: None   Collection Time: 04/28/23  5:07 PM   Specimen: Nasal Mucosa; Nasal Swab  Result Value Ref Range Status   MRSA by PCR Next Gen NOT DETECTED NOT DETECTED Final    Comment: (NOTE) The GeneXpert MRSA Assay (FDA approved for NASAL specimens only), is one component of a comprehensive MRSA colonization surveillance program. It is not intended to diagnose MRSA infection nor to guide or monitor treatment for MRSA infections. Test performance is not FDA approved in patients less than 37 years old. Performed at [facility], [address], [place], [place] [address]   Urine Culture     Status: None   Collection Time: 04/29/23 10:00 AM    Specimen: Urine, Random  Result Value Ref Range Status   Specimen Description   Final    URINE, RANDOM Performed at [facility], [address] [place], [place] [address]    Special Requests   Final    NONE Reflexed from [id number] Performed at [facility], [address] [place], [place] [address]    Culture   Final    NO GROWTH Performed at [facility], [address] [place], [place] [address]    Report Status 05/01/2023 FINAL  Final         Radiology Studies: No results found.      Scheduled Meds:  aspirin EC  81 mg Oral Daily   atorvastatin  40 mg Oral Daily   carvedilol  3.125 mg Oral BID WC   Chlorhexidine Gluconate Cloth  6 each Topical Daily   enoxaparin (LOVENOX) injection  40 mg Subcutaneous Q24H   folic acid  1 mg Intravenous Daily   furosemide  40 mg Intravenous Once   sacubitril-valsartan  1 tablet Oral BID   sodium chloride flush  3 mL Intravenous Q12H   spironolactone  12.5 mg Oral Daily   tamsulosin  0.4 mg Oral QPC breakfast   thiamine (VITAMIN B1) injection  100 mg Intravenous Daily   ticagrelor  90 mg Oral BID   Continuous Infusions:   LOS: 5 days     Tresa Moore, MD [facility]   If 7PM-7AM, please contact night-coverage  05/03/2023, 4:58 PM

## 2023-05-03 NOTE — Plan of Care (Signed)
Problem: Education: Goal: Knowledge of General Education information will improve Description: Including pain rating scale, medication(s)/side effects and non-pharmacologic comfort measures 05/03/2023 0304 by Aretta Nip, RN Outcome: Progressing 05/03/2023 0304 by Aretta Nip, RN Outcome: Not Progressing 05/03/2023 0303 by Aretta Nip, RN Outcome: Progressing   Problem: Health Behavior/Discharge Planning: Goal: Ability to manage health-related needs will improve 05/03/2023 0304 by Aretta Nip, RN Outcome: Progressing 05/03/2023 0304 by Aretta Nip, RN Outcome: Not Progressing 05/03/2023 0303 by Aretta Nip, RN Outcome: Progressing   Problem: Clinical Measurements: Goal: Ability to maintain clinical measurements within normal limits will improve 05/03/2023 0304 by Aretta Nip, RN Outcome: Progressing 05/03/2023 0304 by Aretta Nip, RN Outcome: Not Progressing 05/03/2023 0303 by Aretta Nip, RN Outcome: Progressing Goal: Will remain free from infection 05/03/2023 0304 by Aretta Nip, RN Outcome: Progressing 05/03/2023 0304 by Aretta Nip, RN Outcome: Not Progressing 05/03/2023 0303 by Aretta Nip, RN Outcome: Progressing Goal: Diagnostic test results will improve 05/03/2023 0304 by Aretta Nip, RN Outcome: Progressing 05/03/2023 0304 by Aretta Nip, RN Outcome: Not Progressing 05/03/2023 0303 by Aretta Nip, RN Outcome: Progressing Goal: Respiratory complications will improve 05/03/2023 0304 by Aretta Nip, RN Outcome: Progressing 05/03/2023 0304 by Aretta Nip, RN Outcome: Not Progressing 05/03/2023 0303 by Aretta Nip, RN Outcome: Progressing Goal: Cardiovascular complication will be avoided 05/03/2023 0304 by Aretta Nip, RN Outcome: Progressing 05/03/2023 0304 by Aretta Nip, RN Outcome: Not Progressing 05/03/2023 0303 by Aretta Nip, RN Outcome: Progressing   Problem: Activity: Goal: Risk for  activity intolerance will decrease 05/03/2023 0304 by Aretta Nip, RN Outcome: Progressing 05/03/2023 0304 by Aretta Nip, RN Outcome: Not Progressing 05/03/2023 0303 by Aretta Nip, RN Outcome: Progressing   Problem: Nutrition: Goal: Adequate nutrition will be maintained 05/03/2023 0304 by Aretta Nip, RN Outcome: Progressing 05/03/2023 0304 by Aretta Nip, RN Outcome: Not Progressing 05/03/2023 0303 by Aretta Nip, RN Outcome: Progressing   Problem: Coping: Goal: Level of anxiety will decrease 05/03/2023 0304 by Aretta Nip, RN Outcome: Progressing 05/03/2023 0304 by Aretta Nip, RN Outcome: Not Progressing 05/03/2023 0303 by Aretta Nip, RN Outcome: Progressing   Problem: Elimination: Goal: Will not experience complications related to bowel motility 05/03/2023 0304 by Aretta Nip, RN Outcome: Progressing 05/03/2023 0304 by Aretta Nip, RN Outcome: Not Progressing 05/03/2023 0303 by Aretta Nip, RN Outcome: Progressing Goal: Will not experience complications related to urinary retention 05/03/2023 0304 by Aretta Nip, RN Outcome: Not Progressing 05/03/2023 0304 by Aretta Nip, RN Outcome: Not Progressing 05/03/2023 0303 by Aretta Nip, RN Outcome: Not Progressing Note: Patient has pain with urination as well as retention   Problem: Pain Managment: Goal: General experience of comfort will improve 05/03/2023 0304 by Aretta Nip, RN Outcome: Progressing 05/03/2023 0304 by Aretta Nip, RN Outcome: Not Progressing 05/03/2023 0303 by Aretta Nip, RN Outcome: Progressing   Problem: Safety: Goal: Ability to remain free from injury will improve 05/03/2023 0304 by Aretta Nip, RN Outcome: Progressing 05/03/2023 0304 by Aretta Nip, RN Outcome: Not Progressing 05/03/2023 0303 by Aretta Nip, RN Outcome: Progressing   Problem: Skin Integrity: Goal: Risk for impaired skin integrity will  decrease 05/03/2023 0304 by Aretta Nip, RN Outcome: Progressing 05/03/2023 0304 by Aretta Nip, RN Outcome: Not Progressing 05/03/2023 0303 by Aretta Nip, RN Outcome: Progressing  Problem: Education: Goal: Will demonstrate proper wound care and an understanding of methods to prevent future damage 05/03/2023 0304 by Aretta Nip, RN Outcome: Progressing 05/03/2023 0304 by Aretta Nip, RN Outcome: Not Progressing 05/03/2023 0303 by Aretta Nip, RN Outcome: Progressing Goal: Knowledge of disease or condition will improve 05/03/2023 0304 by Aretta Nip, RN Outcome: Progressing 05/03/2023 0304 by Aretta Nip, RN Outcome: Not Progressing 05/03/2023 0303 by Aretta Nip, RN Outcome: Progressing Goal: Knowledge of the prescribed therapeutic regimen will improve 05/03/2023 0304 by Aretta Nip, RN Outcome: Progressing 05/03/2023 0304 by Aretta Nip, RN Outcome: Not Progressing 05/03/2023 0303 by Aretta Nip, RN Outcome: Progressing Goal: Individualized Educational Video(s) 05/03/2023 0304 by Aretta Nip, RN Outcome: Progressing 05/03/2023 0304 by Aretta Nip, RN Outcome: Not Progressing 05/03/2023 0303 by Aretta Nip, RN Outcome: Progressing   Problem: Activity: Goal: Risk for activity intolerance will decrease 05/03/2023 0304 by Aretta Nip, RN Outcome: Progressing 05/03/2023 0304 by Aretta Nip, RN Outcome: Not Progressing 05/03/2023 0303 by Aretta Nip, RN Outcome: Progressing   Problem: Cardiac: Goal: Will achieve and/or maintain hemodynamic stability 05/03/2023 0304 by Aretta Nip, RN Outcome: Progressing 05/03/2023 0304 by Aretta Nip, RN Outcome: Not Progressing 05/03/2023 0303 by Aretta Nip, RN Outcome: Progressing   Problem: Clinical Measurements: Goal: Postoperative complications will be avoided or minimized 05/03/2023 0304 by Aretta Nip, RN Outcome: Progressing 05/03/2023 0304 by Aretta Nip, RN Outcome: Not Progressing 05/03/2023 0303 by Aretta Nip, RN Outcome: Progressing   Problem: Respiratory: Goal: Respiratory status will improve 05/03/2023 0304 by Aretta Nip, RN Outcome: Progressing 05/03/2023 0304 by Aretta Nip, RN Outcome: Not Progressing 05/03/2023 0303 by Aretta Nip, RN Outcome: Progressing   Problem: Skin Integrity: Goal: Wound healing without signs and symptoms of infection 05/03/2023 0304 by Aretta Nip, RN Outcome: Progressing 05/03/2023 0304 by Aretta Nip, RN Outcome: Not Progressing 05/03/2023 0303 by Aretta Nip, RN Outcome: Progressing Goal: Risk for impaired skin integrity will decrease 05/03/2023 0304 by Aretta Nip, RN Outcome: Progressing 05/03/2023 0304 by Aretta Nip, RN Outcome: Not Progressing 05/03/2023 0303 by Aretta Nip, RN Outcome: Progressing   Problem: Urinary Elimination: Goal: Ability to achieve and maintain adequate renal perfusion and functioning will improve 05/03/2023 0304 by Aretta Nip, RN Outcome: Progressing 05/03/2023 0304 by Aretta Nip, RN Outcome: Not Progressing 05/03/2023 0303 by Aretta Nip, RN Outcome: Progressing   Problem: Education: Goal: Understanding of CV disease, CV risk reduction, and recovery process will improve 05/03/2023 0304 by Aretta Nip, RN Outcome: Progressing 05/03/2023 0304 by Aretta Nip, RN Outcome: Not Progressing 05/03/2023 0303 by Aretta Nip, RN Outcome: Progressing Goal: Individualized Educational Video(s) 05/03/2023 0304 by Aretta Nip, RN Outcome: Progressing 05/03/2023 0304 by Aretta Nip, RN Outcome: Not Progressing 05/03/2023 0303 by Aretta Nip, RN Outcome: Progressing   Problem: Activity: Goal: Ability to return to baseline activity level will improve 05/03/2023 0304 by Aretta Nip, RN Outcome: Progressing 05/03/2023 0304 by Aretta Nip, RN Outcome: Not Progressing 05/03/2023 0303 by  Aretta Nip, RN Outcome: Progressing   Problem: Cardiovascular: Goal: Ability to achieve and maintain adequate cardiovascular perfusion will improve 05/03/2023 0304 by Aretta Nip, RN Outcome: Progressing 05/03/2023 0304 by Aretta Nip, RN Outcome: Not Progressing 05/03/2023 0303 by Aretta Nip, RN Outcome: Progressing Goal: Vascular access site(s) Level 0-1  will be maintained 05/03/2023 0304 by Aretta Nip, RN Outcome: Progressing 05/03/2023 0304 by Aretta Nip, RN Outcome: Not Progressing 05/03/2023 0303 by Aretta Nip, RN Outcome: Progressing   Problem: Health Behavior/Discharge Planning: Goal: Ability to safely manage health-related needs after discharge will improve 05/03/2023 0304 by Aretta Nip, RN Outcome: Progressing 05/03/2023 0304 by Aretta Nip, RN Outcome: Not Progressing 05/03/2023 0303 by Aretta Nip, RN Outcome: Progressing

## 2023-05-03 NOTE — Progress Notes (Signed)
2050 took over patient care patient alert x4 able to make all needs known call light in reach patient stood to side of bed to use urinal on 2l [place] took 02 off sp02 was 99% with 02 on. No complaints of pain at this time. Urine dark tea colored

## 2023-05-03 NOTE — Consult Note (Signed)
Advanced Heart Failure Team Consult Note   Primary Physician: Pcp, No PCP-Cardiologist:  None  Reason for Consultation: Heart Failure  HF: Dr. Gasper Lloyd  HPI:   Ronnie Mann is a 67 year old with a history of kideny stones, previous ureteral stents, former smoker (quit 5 years ago) and no previous coronary disease. Presented to [facility] with increased shortness of breath and chest pain.  Admitted Acute HFrEF/UTI.  CTA negative PE, large staghorn calculus in the upper pole right kidney fills the upper pole collecting system. . Urology consulted.  LHC with 2 vessel disease with balloon angioplasty to proximal LAD and DES to RCA. Developed flash pulmonary edema. Started on bipap , NTG drip, and given IV lasix.  On arrival to ICU he removed Bipap. Currently on NTG 3 mcg. Placed on Loop with stable sats.   Interval hx:  - Significant improvement in symptoms.  - 1L urine output over the past 24h - Euvolemic to mildly hypervolemic on exam today   Objective:    Vital Signs:   Temp:  [97.7 F (36.5 C)-98 F (36.7 C)] 98 F (36.7 C) (06/12 0800) Pulse Rate:  [64-87] 84 (06/12 0800) Resp:  [11-21] 20 (06/12 0700) BP: (102-145)/(49-107) 137/105 (06/12 0800) SpO2:  [92 %-100 %] 99 % (06/12 0800) Last BM Date : 05/01/23  Weight change: Filed Weights   04/28/23 1112  Weight: 95.3 kg    Intake/Output:   Intake/Output Summary (Last 24 hours) at 05/03/2023 1005 Last data filed at 05/03/2023 0523 Gross per 24 hour  Intake 646 ml  Output 750 ml  Net -104 ml       Physical Exam    General:   No resp difficulty HEENT: normal Neck: supple. JVP 8-9. Cor: PMI nondisplaced. Regular rate & rhythm. No rubs, gallops or murmurs. Lungs: clear Abdomen: soft, nontender, nondistended. No hepatosplenomegaly. No bruits or masses. Good bowel sounds. Extremities: no cyanosis, clubbing, rash, edema Neuro: alert & orientedx3, cranial nerves grossly intact. moves all 4 extremities w/o difficulty. Affect  pleasant   Telemetry   SR 90s   EKG      Labs   Basic Metabolic Panel: Recent Labs  Lab 04/28/23 1130 04/29/23 0507 04/30/23 0615 04/30/23 1154 05/01/23 0434 05/02/23 0413  NA 138 139  --  142  --  143  K 4.1 4.4  --  4.2  --  3.7  CL 104 108  --  108  --  107  CO2 22 22  --  24  --  28  GLUCOSE 293* 146*  --  124*  --  120*  BUN 30* 27*  --  27*  --  28*  CREATININE 1.21 0.93  --  0.96 0.95 1.00  CALCIUM 9.8 8.8*  --  8.6*  --  8.6*  MG  --   --  2.0  --   --   --   PHOS  --   --  2.7  --   --   --      Liver Function Tests: Recent Labs  Lab 04/28/23 1130 04/29/23 0507  AST 32 122*  ALT 22 30  ALKPHOS 101 93  BILITOT 0.8 1.1  PROT 7.7 7.1  ALBUMIN 4.2 4.1    Recent Labs  Lab 04/28/23 1130  LIPASE 29    No results for input(s): "AMMONIA" in the last 168 hours.  CBC: Recent Labs  Lab 04/28/23 1130 04/29/23 0507 04/30/23 0615 05/01/23 0435 05/02/23 0413  WBC 14.4*  10.9* 7.6 7.3 6.5  HGB 12.8* 11.6* 10.5* 10.3* 9.7*  HCT 39.8 35.3* 31.6* 31.8* 28.8*  MCV 100.0 98.6 97.8 99.7 98.6  PLT 255 182 164 160 157     Cardiac Enzymes: No results for input(s): "CKTOTAL", "CKMB", "CKMBINDEX", "TROPONINI" in the last 168 hours.  BNP: BNP (last 3 results) Recent Labs    04/28/23 1130  BNP 530.9*     ProBNP (last 3 results) No results for input(s): "PROBNP" in the last 8760 hours.   CBG: Recent Labs  Lab 05/02/23 0328 05/02/23 0803 05/02/23 1121 05/02/23 1632 05/03/23 0723  GLUCAP 108* 121* 216* 168* 114*     Coagulation Studies: No results for input(s): "LABPROT", "INR" in the last 72 hours.    Imaging   No results found.   Medications:     Current Medications:  aspirin EC  81 mg Oral Daily   atorvastatin  40 mg Oral Daily   carvedilol  3.125 mg Oral BID WC   Chlorhexidine Gluconate Cloth  6 each Topical Daily   enoxaparin (LOVENOX) injection  40 mg Subcutaneous Q24H   folic acid  1 mg Intravenous Daily    furosemide  40 mg Intravenous Once   furosemide  40 mg Intravenous Once   potassium chloride  40 mEq Oral Once   sacubitril-valsartan  1 tablet Oral BID   sodium chloride flush  3 mL Intravenous Q12H   spironolactone  12.5 mg Oral Daily   tamsulosin  0.4 mg Oral QPC breakfast   thiamine (VITAMIN B1) injection  100 mg Intravenous Daily   ticagrelor  90 mg Oral BID    Infusions:      Patient Profile   Ronnie Mann is a 67 year old with a history of kideny stones, previous ureteral stents, former smoker (quit 5 years ago) and no previous coronary disease.   Admitted with chest pain-->NSTEMI, Acute HFrEF, and UTI/left Hydronephrosis.    Assessment/Plan   1. NTEMI HS Trop 54>234 >23000 LHC with severe 2 vessel disease subtotal proximal, moderate mid LAD and severe distal LAD. 95% RCA 95%. Ballon angioplasty proximal  LAD with DES to RCA.  No chest pain.  Stop NTG.  On brillinta + aspirin+ atorvastatin.   2. Acute HFrEF , ICM  Echo EF 30-35% RV normal Moderate Ronnie . No previous Echo  LVEPD - 34. Given IV  lasix in cath lab.  So far 650 cc urine output./  - Agree with transition to Entresto 24/26mg  BID; with underlying severe Ronnie will want to aggressively titrate afterload reduction. Plan to increase to 49/51mg  BID tomorrow.  - Can transition to lasix PO 40mg  tomorrow - coreg 3.125mg  BID - Holding SGT2I due to need for ureteral stents and frequent UTI - spironolactone 12.5mg  daily  - Can D/C tomorrow. Will follow outpatient.    3.Acute Respiratory Failure -->pulmonary edema Off [place] today.  Resolved.   4.  HTN See above; increase Entresto to 49/51mg  BID tomorrow if SBP allows.   5 L Proximal Ureteral Obstruction/ Stones/ UTI  Urology following. Initially refused stent but willing to reconsider.   -WBC ct 6.5 today  Length of Stay: 5  Frederick Klinger, DO  05/03/2023, 10:05 AM  Advanced Heart Failure Team Pager [phone] (M-F; 7a - 5p)  Please contact [facility] for  night-coverage after hours (4p -7a ) and weekends on [email or web address]

## 2023-05-04 ENCOUNTER — Other Ambulatory Visit (HOSPITAL_COMMUNITY): Payer: Self-pay

## 2023-05-04 DIAGNOSIS — I255 Ischemic cardiomyopathy: Secondary | ICD-10-CM

## 2023-05-04 DIAGNOSIS — N179 Acute kidney failure, unspecified: Secondary | ICD-10-CM

## 2023-05-04 DIAGNOSIS — I5021 Acute systolic (congestive) heart failure: Secondary | ICD-10-CM | POA: Diagnosis not present

## 2023-05-04 DIAGNOSIS — E872 Acidosis, unspecified: Secondary | ICD-10-CM

## 2023-05-04 DIAGNOSIS — I25118 Atherosclerotic heart disease of native coronary artery with other forms of angina pectoris: Secondary | ICD-10-CM

## 2023-05-04 DIAGNOSIS — J069 Acute upper respiratory infection, unspecified: Secondary | ICD-10-CM

## 2023-05-04 LAB — BASIC METABOLIC PANEL
Anion gap: 12 (ref 5–15)
BUN: 69 mg/dL — ABNORMAL HIGH (ref 8–23)
CO2: 23 mmol/L (ref 22–32)
Calcium: 9.6 mg/dL (ref 8.9–10.3)
Chloride: 103 mmol/L (ref 98–111)
Creatinine, Ser: 1.14 mg/dL (ref 0.61–1.24)
GFR, Estimated: >60 mL/min (ref >60)
Glucose, Bld: 245 mg/dL — ABNORMAL HIGH (ref 70–99)
Potassium: 3.9 mmol/L (ref 3.5–5.1)
Sodium: 138 mmol/L (ref 135–145)

## 2023-05-04 LAB — GLUCOSE, CAPILLARY
Glucose-Capillary: 146 mg/dL — ABNORMAL HIGH (ref 70–99)
Glucose-Capillary: 178 mg/dL — ABNORMAL HIGH (ref 70–99)
Glucose-Capillary: 174 mg/dL — ABNORMAL HIGH (ref 70–99)
Glucose-Capillary: 162 mg/dL — ABNORMAL HIGH (ref 70–99)

## 2023-05-04 MED ORDER — MIDODRINE HCL 5 MG PO TABS
10.0000 mg | ORAL_TABLET | Freq: Once | ORAL | Status: AC
  Administered 2023-05-04: 10 mg via ORAL
  Filled 2023-05-04: qty 2

## 2023-05-04 MED ORDER — FUROSEMIDE 40 MG PO TABS
40.0000 mg | ORAL_TABLET | Freq: Every day | ORAL | Status: DC
  Administered 2023-05-04: 40 mg via ORAL
  Filled 2023-05-04: qty 1

## 2023-05-04 MED ORDER — SODIUM CHLORIDE 0.9 % IV BOLUS
150.0000 mL | Freq: Once | INTRAVENOUS | Status: AC
  Administered 2023-05-04: 150 mL via INTRAVENOUS

## 2023-05-04 NOTE — Progress Notes (Signed)
       CROSS COVER NOTE  NAME: ZYMIERE TROSTLE MRN: [id number] DOB : 1956-06-03    Concern as stated by nurse / staff   Received via chat "patients blood pressure 51/40 HR 95. I have the patient in trendelenburg"     Pertinent findings on chart review: Admitted with NSTEMI and CHf exacerbation, kidney stone with hydronephrosis and suspected sepsis from UTI (culture without growth) Significant findings on Echo EF 30-35% moderate to severe mitral regurg, ischemic cardiomyopathy.  Received xanax and oxycodone 1612 pm and 1453 pm respectively. With BP 85/51 at 1625 pm Lasix stopped today per notes but did receive dose early this am K 6/13 3.9, Mag 6/9 2.0  Assessment and  Interventions   Assessment: Denies chest pain; endorses symptoms of feeling dizzy especially with sitting up.  No diaphoresis, still slightly anxious with concerns of why he feels bad  CBG 162 EKG reviewed by me. SR with PVCs rate (# NO ST elevation/ischemic changes Plan: NS 150 nl bolus IV Hold oxycodone and xanax Midodrine 10 mg x1 Mag added to AM labs       Donnie Mesa NP [facility] Cross Cover 7pm-7am - check amion for availability Pager [phone]

## 2023-05-04 NOTE — TOC Benefit Eligibility Note (Signed)
Pharmacy Patient Advocate Encounter  Insurance verification completed.    The patient is insured through [employer] test claim for [place] and the current 30 day co-pay is $0.00.   This test claim was processed through [facility]- copay amounts may vary at other pharmacies due to pharmacy/plan contracts, or as the patient moves through the different stages of their insurance plan.

## 2023-05-04 NOTE — Progress Notes (Signed)
PROGRESS NOTE    Ronnie Mann  WGN:[id number] DOB: [birth year] DOA: 04/28/2023 PCP: Oneita Hurt, No    Brief Narrative:  Ronnie Mann is a 67 y.o. male with medical history significant of  nephrolithiasis requiring multiple surgeries and lithotripsies in the past.  He reports right-sided kidney pain that has been ongoing and related to that for a long time.  He also reports URI congestion and runny nose for the last week prior to admission.  He reports new onset of substernal "vice like" substernal chest pain that radiated to his left arm.  He reports ongoing shortness of breath associated with this.  He also was awoken from sleep with this last night and had broken out into a cold sweat.  In the ED he was noted to have an elevated lactic acid and elevated WBC at 14.4.  He was given a liter of IV fluids.  Chest x-ray was done and showed pulmonary edema and his BNP said came back at 530.  He was not given further IV fluid boluses.  His initial troponin was 54 rose to 234.  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  Additionally patient was noted to have a CBG of 298 and did report polyuria and polydipsia at home.    Assessment & Plan:   Principal Problem:   CHF (congestive heart failure) (HCC) Active Problems:   Elevated troponin   Substernal chest pain   Staghorn calculus   Lactic acidosis   AKI (acute kidney injury) (HCC)   Hydronephrosis of left kidney   Wheezing   Essential hypertension   Hiatal hernia   Cholelithiasis   Shortness of breath   Viral URI   Non-ST elevation (NSTEMI) myocardial infarction (HCC)   Acute systolic heart failure (HCC)   Acute hyperglycemia   Ischemic cardiomyopathy   Coronary artery disease of  native artery of native heart with stable angina pectoris (HCC)  Acute systolic CHF (congestive heart failure) (HCC) Secondary to ischemic cardiomyopathy Ejection fraction 30 to 35% Developed flash pulmonary edema during left heart cath 06/10 and was treated with nitroglycerin drip Plan: IV diuresis on hold.  Patient making good progress overall.  Received 40 mg of p.o. Lasix today.  Recheck kidney function in a.m.  Will touch base with cardiology regarding diuretic recommendations for DC  Acute non-ST elevation MI Patient presented for evaluation of chest pain over the left anterior chest wall with radiation to the left arm Status post successful balloon angioplasty to the proximal LAD and drug-eluting stent placement to the proximal right coronary artery. Plan: Continue aspirin, high intensity statin, carvedilol and Brilinta   History of nephrolithiasis/left hydronephrosis/staghorn calculi Sepsis from a urinary source Patient noted to have significant pyuria, tachycardia, tachypnea, lactic acidosis and leukocytosis Urine culture is sterile Patient was treated empirically with Rocephin No plans for inpatient urologic intervention Follow-up with urology as an outpatient   Community-acquired pneumonia Completed IV Rocephin and Zithromax No further antibiotics Monitor vitals and fever curve   Hyperglycemia in the setting of acute non-ST elevation MI No known history of diabetes mellitus Random CBG of 298 on admission Started on insulin Hemoglobin A1c is 5.5 Will discontinue insulin due to increased risk for hypoglycemia Maintain consistent carbohydrate diet Consider SGLT2  inhibitor on DC   DVT prophylaxis: SQ Lovenox Code Status: Full Family Communication: None Disposition Plan: Status is: Inpatient Remains inpatient appropriate because: Decompensated heart failure   Level of care: Progressive  Consultants:  Heart failure/cardiology  Procedures:  Left heart  catheterization  Antimicrobials: None   Subjective: Seen and examined sitting up on bed.  Feeling well overall.  Slept better last night  Objective: Vitals:   05/04/23 0500 05/04/23 0804 05/04/23 0845 05/04/23 1215  BP: (!) 92/56 (!) 98/55  112/83  Pulse: 77 100  92  Resp: 17 20  18   Temp:  (!) 97.5 F (36.4 C)  (!) 97.3 F (36.3 C)  TempSrc:      SpO2:  (!) 84% 100% 100%  Weight:      Height:        Intake/Output Summary (Last 24 hours) at 05/04/2023 1244 Last data filed at 05/04/2023 0940 Gross per 24 hour  Intake 240 ml  Output 2450 ml  Net -2210 ml   Filed Weights   04/28/23 1112  Weight: 95.3 kg    Examination:  General exam: NAD Respiratory system: Lungs clear.  Normal work of breathing.  Room air Cardiovascular system: S1-S2, RRR, no murmurs, trace pedal edema Gastrointestinal system: Soft, NT/ND, normal bowel sounds Central nervous system: Alert and oriented. No focal neurological deficits. Extremities: Symmetric 5 x 5 power. Skin: No rashes, lesions or ulcers Psychiatry: Judgement and insight appear normal. Mood & affect appropriate.     Data Reviewed: I have personally reviewed following labs and imaging studies  CBC: Recent Labs  Lab 04/28/23 1130 04/29/23 0507 04/30/23 0615 05/01/23 0435 05/02/23 0413  WBC 14.4* 10.9* 7.6 7.3 6.5  HGB 12.8* 11.6* 10.5* 10.3* 9.7*  HCT 39.8 35.3* 31.6* 31.8* 28.8*  MCV 100.0 98.6 97.8 99.7 98.6  PLT 255 182 164 160 157   Basic Metabolic Panel: Recent Labs  Lab 04/28/23 1130 04/29/23 0507 04/30/23 0615 04/30/23 1154 05/01/23 0434 05/02/23 0413 05/04/23 1136  NA 138 139  --  142  --  143 138  K 4.1 4.4  --  4.2  --  3.7 3.9  CL 104 108  --  108  --  107 103  CO2 22 22  --  24  --  28 23  GLUCOSE 293* 146*  --  124*  --  120* 245*  BUN 30* 27*  --  27*  --  28* 69*  CREATININE 1.21 0.93  --  0.96 0.95 1.00 1.14  CALCIUM 9.8 8.8*  --  8.6*  --  8.6* 9.6  MG  --   --  2.0  --   --   --   --    PHOS  --   --  2.7  --   --   --   --    GFR: Estimated Creatinine Clearance: 71.6 mL/min (by C-G formula based on SCr of 1.14 mg/dL). Liver Function Tests: Recent Labs  Lab 04/28/23 1130 04/29/23 0507  AST 32 122*  ALT 22 30  ALKPHOS 101 93  BILITOT 0.8 1.1  PROT 7.7 7.1  ALBUMIN 4.2 4.1   Recent Labs  Lab 04/28/23 1130  LIPASE 29   No results for input(s): "AMMONIA" in the last 168 hours. Coagulation Profile: Recent Labs  Lab 04/29/23 0507  INR 1.1   Cardiac Enzymes: No results for input(s): "CKTOTAL", "CKMB", "CKMBINDEX", "TROPONINI" in the last 168 hours. BNP (last 3 results) No results for input(s): "PROBNP" in the  last 8760 hours. HbA1C: No results for input(s): "HGBA1C" in the last 72 hours. CBG: Recent Labs  Lab 05/03/23 0723 05/03/23 1140 05/03/23 1623 05/04/23 0806 05/04/23 1221  GLUCAP 114* 173* 173* 146* 178*   Lipid Profile: No results for input(s): "CHOL", "HDL", "LDLCALC", "TRIG", "CHOLHDL", "LDLDIRECT" in the last 72 hours. Thyroid Function Tests: No results for input(s): "TSH", "T4TOTAL", "FREET4", "T3FREE", "THYROIDAB" in the last 72 hours. Anemia Panel: No results for input(s): "VITAMINB12", "FOLATE", "FERRITIN", "TIBC", "IRON", "RETICCTPCT" in the last 72 hours. Sepsis Labs: Recent Labs  Lab 04/28/23 1130 04/28/23 1347  LATICACIDVEN 2.5* 2.6*    Recent Results (from the past 240 hour(s))  Blood culture (routine x 2)     Status: None   Collection Time: 04/28/23 11:30 AM   Specimen: BLOOD  Result Value Ref Range Status   Specimen Description BLOOD BLOOD LEFT FOREARM  Final   Special Requests   Final    BOTTLES DRAWN AEROBIC AND ANAEROBIC Blood Culture adequate volume   Culture   Final    NO GROWTH 5 DAYS Performed at [facility], [address] [place], [place] [address]    Report Status 05/03/2023 FINAL  Final  Blood culture (routine x 2)     Status: None   Collection Time: 04/28/23 11:30 AM   Specimen: BLOOD   Result Value Ref Range Status   Specimen Description BLOOD BLOOD RIGHT FOREARM  Final   Special Requests   Final    BOTTLES DRAWN AEROBIC AND ANAEROBIC Blood Culture results may not be optimal due to an excessive volume of blood received in culture bottles   Culture   Final    NO GROWTH 5 DAYS Performed at [facility], [address], [place], [place] [address]    Report Status 05/03/2023 FINAL  Final  SARS Coronavirus 2 by RT PCR (hospital order, performed in [facility] [place] [facility] hospital lab) *cepheid single result test* Anterior Nasal Swab     Status: None   Collection Time: 04/28/23 12:09 PM   Specimen: Anterior Nasal Swab  Result Value Ref Range Status   SARS Coronavirus 2 by RT PCR NEGATIVE NEGATIVE Final    Comment: (NOTE) SARS-CoV-2 target nucleic acids are NOT DETECTED.  The SARS-CoV-2 RNA is generally detectable in upper and lower respiratory specimens during the acute phase of infection. The lowest concentration of SARS-CoV-2 viral copies this assay can detect is 250 copies / mL. A negative result does not preclude SARS-CoV-2 infection and should not be used as the sole basis for treatment or other patient management decisions.  A negative result may occur with improper specimen collection / handling, submission of specimen other than nasopharyngeal swab, presence of viral mutation(s) within the areas targeted by this assay, and inadequate number of viral copies (<250 copies / mL). A negative result must be combined with clinical observations, patient history, and epidemiological information.  Fact Sheet for Patients:   RoadLapTop.co.za  Fact Sheet for Healthcare Providers: http://kim-miller.com/  This test is not yet approved or  cleared by the [place] FDA and has been authorized for detection and/or diagnosis of SARS-CoV-2 by FDA under an Emergency Use Authorization (EUA).  This EUA will remain in effect (meaning  this test can be used) for the duration of the COVID-19 declaration under Section 564(b)(1) of the Act, 21 U.S.C. section 360bbb-3(b)(1), unless the authorization is terminated or revoked sooner.  Performed at [facility], [address], [place], [place] [address]   Respiratory (~20 pathogens)  panel by PCR     Status: None   Collection Time: 04/28/23  1:24 PM   Specimen: Nasopharyngeal Swab; Respiratory  Result Value Ref Range Status   Adenovirus NOT DETECTED NOT DETECTED Final   Coronavirus 229E NOT DETECTED NOT DETECTED Final    Comment: (NOTE) The Coronavirus on the Respiratory Panel, DOES NOT test for the novel  Coronavirus (2019 nCoV)    Coronavirus HKU1 NOT DETECTED NOT DETECTED Final   Coronavirus NL63 NOT DETECTED NOT DETECTED Final   Coronavirus OC43 NOT DETECTED NOT DETECTED Final   Metapneumovirus NOT DETECTED NOT DETECTED Final   Rhinovirus / Enterovirus NOT DETECTED NOT DETECTED Final   Influenza A NOT DETECTED NOT DETECTED Final   Influenza B NOT DETECTED NOT DETECTED Final   Parainfluenza Virus 1 NOT DETECTED NOT DETECTED Final   Parainfluenza Virus 2 NOT DETECTED NOT DETECTED Final   Parainfluenza Virus 3 NOT DETECTED NOT DETECTED Final   Parainfluenza Virus 4 NOT DETECTED NOT DETECTED Final   Respiratory Syncytial Virus NOT DETECTED NOT DETECTED Final   Bordetella pertussis NOT DETECTED NOT DETECTED Final   Bordetella Parapertussis NOT DETECTED NOT DETECTED Final   Chlamydophila pneumoniae NOT DETECTED NOT DETECTED Final   Mycoplasma pneumoniae NOT DETECTED NOT DETECTED Final    Comment: Performed at [facility], [address] [place], [place] [address]  MRSA Next Gen by PCR, Nasal     Status: None   Collection Time: 04/28/23  5:07 PM   Specimen: Nasal Mucosa; Nasal Swab  Result Value Ref Range Status   MRSA by PCR Next Gen NOT DETECTED NOT DETECTED Final    Comment: (NOTE) The GeneXpert MRSA Assay (FDA approved for NASAL specimens  only), is one component of a comprehensive MRSA colonization surveillance program. It is not intended to diagnose MRSA infection nor to guide or monitor treatment for MRSA infections. Test performance is not FDA approved in patients less than 66 years old. Performed at [facility], [address] [place], [place] [address]   Urine Culture     Status: None   Collection Time: 04/29/23 10:00 AM   Specimen: Urine, Random  Result Value Ref Range Status   Specimen Description   Final    URINE, RANDOM Performed at [facility], [address], [place], [place] [address]    Special Requests   Final    NONE Reflexed from [id number] Performed at [facility], [address], [place], [place] [address]    Culture   Final    NO GROWTH Performed at [facility], [address] [place], [place] [address]    Report Status 05/01/2023 FINAL  Final         Radiology Studies: No results found.      Scheduled Meds:  aspirin EC  81 mg Oral Daily   atorvastatin  40 mg Oral Daily   carvedilol  3.125 mg Oral BID WC   enoxaparin (LOVENOX) injection  40 mg Subcutaneous Q24H   sacubitril-valsartan  1 tablet Oral BID   sodium chloride flush  3 mL Intravenous Q12H   spironolactone  12.5 mg Oral Daily   tamsulosin  0.4 mg Oral QPC breakfast   ticagrelor  90 mg Oral BID   Continuous Infusions:   LOS: 6 days     Tresa Moore, MD [facility]   If 7PM-7AM, please contact night-coverage  05/04/2023, 12:44 PM

## 2023-05-04 NOTE — Progress Notes (Signed)
Rounding Note    Patient Name: Ronnie Mann Date of Encounter: 05/04/2023  [facility] Cardiologist: [facility]An[facility] Distler  Subjective   Reports feeling anxious last night, better with melatonin and benzodiazepine, was able to sleep 8 hours Denies significant shortness of breath, no chest pain left arm pain Ambulating around the room, no lightheadedness, orthostasis symptoms Received dose of IV Lasix yesterday, Received Lasix 40 p.o. today BMP pending  Inpatient Medications    Scheduled Meds:  aspirin EC  81 mg Oral Daily   atorvastatin  40 mg Oral Daily   carvedilol  3.125 mg Oral BID WC   enoxaparin (LOVENOX) injection  40 mg Subcutaneous Q24H   furosemide  40 mg Oral Daily   sacubitril-valsartan  1 tablet Oral BID   sodium chloride flush  3 mL Intravenous Q12H   spironolactone  12.5 mg Oral Daily   tamsulosin  0.4 mg Oral QPC breakfast   ticagrelor  90 mg Oral BID   Continuous Infusions:  PRN Meds: acetaminophen **OR** acetaminophen, ALPRAZolam, ipratropium, melatonin, ondansetron **OR** ondansetron (ZOFRAN) IV, oxyCODONE, polyethylene glycol   Vital Signs    Vitals:   05/04/23 0424 05/04/23 0500 05/04/23 0804 05/04/23 0845  BP: (!) 78/62 (!) 92/56 (!) 98/55   Pulse: 97 77 100   Resp: 18 17 20    Temp: 98.2 F (36.8 C)  (!) 97.5 F (36.4 C)   TempSrc:      SpO2: 94%  (!) 84% 100%  Weight:      Height:        Intake/Output Summary (Last 24 hours) at 05/04/2023 1119 Last data filed at 05/04/2023 0940 Gross per 24 hour  Intake 240 ml  Output 2450 ml  Net -2210 ml      04/28/2023   11:12 AM 07/01/2020    4:02 PM  Last 3 Weights  Weight (lbs) 210 lb 230 lb  Weight (kg) 95.255 kg 104.327 kg      Telemetry    NSR - Personally Reviewed  ECG     - Personally Reviewed  Physical Exam   GEN: No acute distress.   Neck: No JVD Cardiac: RRR, no murmurs, rubs, or gallops.  Respiratory: Clear to auscultation bilaterally. GI: Soft, nontender,  non-distended  MS: No edema; No deformity. Neuro:  Nonfocal  Psych: Normal affect   Labs    High Sensitivity Troponin:   Recent Labs  Lab 04/28/23 1130 04/28/23 1347 04/29/23 0504  TROPONINIHS 54* 234* 23,432*     Chemistry Recent Labs  Lab 04/28/23 1130 04/29/23 0507 04/30/23 0615 04/30/23 1154 05/01/23 0434 05/02/23 0413  NA 138 139  --  142  --  143  K 4.1 4.4  --  4.2  --  3.7  CL 104 108  --  108  --  107  CO2 22 22  --  24  --  28  GLUCOSE 293* 146*  --  124*  --  120*  BUN 30* 27*  --  27*  --  28*  CREATININE 1.21 0.93  --  0.96 0.95 1.00  CALCIUM 9.8 8.8*  --  8.6*  --  8.6*  MG  --   --  2.0  --   --   --   PROT 7.7 7.1  --   --   --   --   ALBUMIN 4.2 4.1  --   --   --   --   AST 32 122*  --   --   --   --  ALT 22 30  --   --   --   --   ALKPHOS 101 93  --   --   --   --   BILITOT 0.8 1.1  --   --   --   --   GFRNONAA >60 >60  --  >60 >60 >60  ANIONGAP 12 9  --  10  --  8    Lipids No results for input(s): "CHOL", "TRIG", "HDL", "LABVLDL", "LDLCALC", "CHOLHDL" in the last 168 hours.  Hematology Recent Labs  Lab 04/30/23 0615 05/01/23 0435 05/02/23 0413  WBC 7.6 7.3 6.5  RBC 3.23* 3.19* 2.92*  HGB 10.5* 10.3* 9.7*  HCT 31.6* 31.8* 28.8*  MCV 97.8 99.7 98.6  MCH 32.5 32.3 33.2  MCHC 33.2 32.4 33.7  RDW 13.7 13.5 13.5  PLT 164 160 157   Thyroid  Recent Labs  Lab 04/28/23 1130  TSH 4.483    BNP Recent Labs  Lab 04/28/23 1130  BNP 530.9*    DDimer No results for input(s): "DDIMER" in the last 168 hours.   Radiology    No results found.  Cardiac Studies   Echo 1. Left ventricular ejection fraction, by estimation, is 30 to 35%. Left  ventricular ejection fraction by PLAX is 32 %. The left ventricle has  moderately decreased function. The left ventricle demonstrates global  hypokinesis with severe hypokinesis of  the anterior, anteroseptal and apical region. The left ventricular  internal cavity size was mildly dilated. There is  mild left ventricular  hypertrophy. Left ventricular diastolic parameters are consistent with  Grade II diastolic dysfunction  (pseudonormalization).   2. Right ventricular systolic function is normal. The right ventricular  size is normal. There is normal pulmonary artery systolic pressure.   3. The mitral valve is normal in structure. Moderate mitral valve  regurgitation. No evidence of mitral stenosis.   4. The aortic valve is normal in structure. Aortic valve regurgitation is  not visualized. No aortic stenosis is present.   5. There is mild dilatation of the ascending aorta, measuring 42 mm.   6. The inferior vena cava is normal in size with <50% respiratory  variability, suggesting right atrial pressure of 8 mmHg.   Patient Profile     67 y.o. male with history of long history of kidney stones, presenting April 28 2023 to the emergency room with left-sided chest pain radiating to left arm, chronic shortness of breath for more than 1 month with chest congestion and found to have acute HFrEF, NSTEMI, and moderate to severe mitral regurgitation.   Assessment & Plan    Non-STEMI Catheterization performed May 01, 2023 with subtotal occlusion of the proximal LAD and diffuse mid to distal LAD disease as well as 95% stenosis in the proximal right coronary artery. His ejection fraction was 25% with an LVEDP of 34 mmHg.  -->successful balloon angioplasty to proximal LAD and drug-eluting stent placement to the proximal right coronary artery.  -- developed flash pulmonary edema in the catheterization lab and was started on IV nitroglycerin, IV Lasix and BiPAP.  -He has responded to IV Lasix, -11 L Received oral Lasix 40 daily this morning BMP pending (will hold Lasix for tomorrow until results available) -Blood pressure low, no symptoms of orthostasis  Will continue Entresto 24/26 mg twice daily Coreg 3.125 twice daily spironolactone 12.5 daily SGLT2 inhibitor on hold in the setting of urinary  tract infection Advanced heart failure team following   Ischemic cardiomyopathy Prior to cardiac  catheterization, ejection fraction estimated 30 to 35% with moderate MR Medication changes as above Repeat echocardiogram as outpatient on medical management following PCI   Kidney stones with hydronephrosis, UTI Outpatient follow-up with urology for consideration of stenting Treatment of UTI   Essential hypertension Continue medications above, unable to titrate higher given hypotension    For questions or updates, please contact [facility] Please consult [email or web address] for contact info under        Signed, Julien Nordmann, MD  05/04/2023, 11:19 AM

## 2023-05-04 NOTE — Care Management Important Message (Signed)
Important Message  Patient Details  Name: FREDERIC TONES MRN: [id number] Date of Birth: 17-Dec-1955   Medicare Important Message Given:  Yes     [name] 05/04/2023, 2:46 PM

## 2023-05-05 ENCOUNTER — Other Ambulatory Visit: Payer: Self-pay

## 2023-05-05 DIAGNOSIS — I5021 Acute systolic (congestive) heart failure: Secondary | ICD-10-CM | POA: Diagnosis not present

## 2023-05-05 LAB — BASIC METABOLIC PANEL
Anion gap: 12 (ref 5–15)
Anion gap: 11 (ref 5–15)
BUN: 96 mg/dL — ABNORMAL HIGH (ref 8–23)
BUN: 97 mg/dL — ABNORMAL HIGH (ref 8–23)
CO2: 23 mmol/L (ref 22–32)
CO2: 25 mmol/L (ref 22–32)
Calcium: 9.1 mg/dL (ref 8.9–10.3)
Calcium: 9.1 mg/dL (ref 8.9–10.3)
Chloride: 100 mmol/L (ref 98–111)
Chloride: 99 mmol/L (ref 98–111)
Creatinine, Ser: 1.84 mg/dL — ABNORMAL HIGH (ref 0.61–1.24)
Creatinine, Ser: 1.7 mg/dL — ABNORMAL HIGH (ref 0.61–1.24)
GFR, Estimated: 40 mL/min — ABNORMAL LOW (ref >60)
GFR, Estimated: 44 mL/min — ABNORMAL LOW (ref >60)
Glucose, Bld: 199 mg/dL — ABNORMAL HIGH (ref 70–99)
Glucose, Bld: 168 mg/dL — ABNORMAL HIGH (ref 70–99)
Potassium: 3.9 mmol/L (ref 3.5–5.1)
Potassium: 3.9 mmol/L (ref 3.5–5.1)
Sodium: 135 mmol/L (ref 135–145)
Sodium: 135 mmol/L (ref 135–145)

## 2023-05-05 LAB — GLUCOSE, CAPILLARY
Glucose-Capillary: 198 mg/dL — ABNORMAL HIGH (ref 70–99)
Glucose-Capillary: 233 mg/dL — ABNORMAL HIGH (ref 70–99)
Glucose-Capillary: 241 mg/dL — ABNORMAL HIGH (ref 70–99)

## 2023-05-05 LAB — MAGNESIUM: Magnesium: 2.3 mg/dL (ref 1.7–2.4)

## 2023-05-05 MED ORDER — SODIUM CHLORIDE 0.9 % IV BOLUS
500.0000 mL | Freq: Once | INTRAVENOUS | Status: AC
  Administered 2023-05-05: 500 mL via INTRAVENOUS

## 2023-05-05 MED ORDER — BISACODYL 10 MG RE SUPP: 10.0000 mg | Freq: Every day | RECTAL | Status: DC | PRN

## 2023-05-05 NOTE — Progress Notes (Signed)
Rounding Note    Patient Name: Ronnie Mann Date of Encounter: 05/05/2023  [facility] Cardiologist: [facility]  Subjective   Reports feeling well this morning, no complaints Denies shortness of breath, no chest pain, no left arm pain, right flank pain improving Urine more clear  Inpatient Medications    Scheduled Meds:  aspirin EC  81 mg Oral Daily   atorvastatin  40 mg Oral Daily   enoxaparin (LOVENOX) injection  40 mg Subcutaneous Q24H   sodium chloride flush  3 mL Intravenous Q12H   tamsulosin  0.4 mg Oral QPC breakfast   ticagrelor  90 mg Oral BID   Continuous Infusions:  PRN Meds: acetaminophen **OR** acetaminophen, ALPRAZolam, ipratropium, melatonin, ondansetron **OR** ondansetron (ZOFRAN) IV, oxyCODONE, polyethylene glycol   Vital Signs    Vitals:   05/04/23 2132 05/04/23 2354 05/05/23 0451 05/05/23 0945  BP: (!) 94/53 (!) 82/63 96/79 (!) 82/52  Pulse:   74 89  Resp:  18 19 16   Temp:  97.7 F (36.5 C) (!) 97.5 F (36.4 C)   TempSrc:  Axillary Axillary   SpO2:  100% 100% 100%  Weight:      Height:        Intake/Output Summary (Last 24 hours) at 05/05/2023 1140 Last data filed at 05/05/2023 0915 Gross per 24 hour  Intake 590 ml  Output 1270 ml  Net -680 ml      04/28/2023   11:12 AM 07/01/2020    4:02 PM  Last 3 Weights  Weight (lbs) 210 lb 230 lb  Weight (kg) 95.255 kg 104.327 kg      Telemetry    NSR - Personally Reviewed  ECG     - Personally Reviewed  Physical Exam   Constitutional:  oriented to person, place, and time. No distress.  HENT:  Head: Grossly normal Eyes:  no discharge. No scleral icterus.  Neck: No JVD, no carotid bruits  Cardiovascular: Regular rate and rhythm, no murmurs appreciated Pulmonary/Chest: Clear to auscultation bilaterally, no wheezes or rails Abdominal: Soft.  no distension.  no tenderness.  Musculoskeletal: Normal range of motion Neurological:  normal muscle tone. Coordination normal. No  atrophy Skin: Skin warm and dry Psychiatric: normal affect, pleasant  Labs    High Sensitivity Troponin:   Recent Labs  Lab 04/28/23 1130 04/28/23 1347 04/29/23 0504  TROPONINIHS 54* 234* 23,432*     Chemistry Recent Labs  Lab 04/29/23 0507 04/30/23 0615 04/30/23 1154 05/02/23 0413 05/04/23 1136 05/05/23 0701  NA 139  --    < > 143 138 135  K 4.4  --    < > 3.7 3.9 3.9  CL 108  --    < > 107 103 100  CO2 22  --    < > 28 23 23   GLUCOSE 146*  --    < > 120* 245* 199*  BUN 27*  --    < > 28* 69* 96*  CREATININE 0.93  --    < > 1.00 1.14 1.84*  CALCIUM 8.8*  --    < > 8.6* 9.6 9.1  MG  --  2.0  --   --   --  2.3  PROT 7.1  --   --   --   --   --   ALBUMIN 4.1  --   --   --   --   --   AST 122*  --   --   --   --   --  ALT 30  --   --   --   --   --   ALKPHOS 93  --   --   --   --   --   BILITOT 1.1  --   --   --   --   --   GFRNONAA >60  --    < > >60 >60 40*  ANIONGAP 9  --    < > 8 12 12    < > = values in this interval not displayed.    Lipids No results for input(s): "CHOL", "TRIG", "HDL", "LABVLDL", "LDLCALC", "CHOLHDL" in the last 168 hours.  Hematology Recent Labs  Lab 04/30/23 0615 05/01/23 0435 05/02/23 0413  WBC 7.6 7.3 6.5  RBC 3.23* 3.19* 2.92*  HGB 10.5* 10.3* 9.7*  HCT 31.6* 31.8* 28.8*  MCV 97.8 99.7 98.6  MCH 32.5 32.3 33.2  MCHC 33.2 32.4 33.7  RDW 13.7 13.5 13.5  PLT 164 160 157   Thyroid  No results for input(s): "TSH", "FREET4" in the last 168 hours.   BNP No results for input(s): "BNP", "PROBNP" in the last 168 hours.   DDimer No results for input(s): "DDIMER" in the last 168 hours.   Radiology    No results found.  Cardiac Studies   Echo 1. Left ventricular ejection fraction, by estimation, is 30 to 35%. Left  ventricular ejection fraction by PLAX is 32 %. The left ventricle has  moderately decreased function. The left ventricle demonstrates global  hypokinesis with severe hypokinesis of  the anterior, anteroseptal and  apical region. The left ventricular  internal cavity size was mildly dilated. There is mild left ventricular  hypertrophy. Left ventricular diastolic parameters are consistent with  Grade II diastolic dysfunction  (pseudonormalization).   2. Right ventricular systolic function is normal. The right ventricular  size is normal. There is normal pulmonary artery systolic pressure.   3. The mitral valve is normal in structure. Moderate mitral valve  regurgitation. No evidence of mitral stenosis.   4. The aortic valve is normal in structure. Aortic valve regurgitation is  not visualized. No aortic stenosis is present.   5. There is mild dilatation of the ascending aorta, measuring 42 mm.   6. The inferior vena cava is normal in size with <50% respiratory  variability, suggesting right atrial pressure of 8 mmHg.   Patient Profile     67 y.o. male with history of long history of kidney stones, presenting April 28 2023 to the emergency room with left-sided chest pain radiating to left arm, chronic shortness of breath for more than 1 month with chest congestion and found to have acute HFrEF, NSTEMI, and moderate to severe mitral regurgitation.   Assessment & Plan    Non-STEMI Catheterization performed May 01, 2023 with subtotal occlusion of the proximal LAD and diffuse mid to distal LAD disease as well as 95% stenosis in the proximal right coronary artery. His ejection fraction was 25% with an LVEDP of 34 mmHg.  -->successful balloon angioplasty to proximal LAD and drug-eluting stent placement to the proximal right coronary artery.  -- developed flash pulmonary edema in the catheterization lab and was started on IV nitroglycerin, IV Lasix and BiPAP.  -Continue diuresis this admission, -11 L with improved respiratory status Received Lasix yesterday before he could be seen on rounds, since held -Also received Entresto 24/26 mg twice daily Coreg 3.125 twice daily spironolactone 12.5 daily SGLT2  inhibitor on hold in the setting of urinary tract infection -  With climbing BUN and creatinine, medications above have been held   Ischemic cardiomyopathy Prior to cardiac catheterization, ejection fraction estimated 30 to 35% with moderate MR -Entresto, Coreg, spironolactone on hold given creatinine higher -Normal saline 500 IV bolus given this morning, encourage p.o. fluid intake today -Will look to restart Entresto, Coreg, spironolactone as renal function normalizes   Kidney stones with hydronephrosis, UTI Outpatient follow-up with urology for consideration of stenting Treatment of UTI   Essential hypertension Blood pressure low following medications above and treatment with Lasix -Medications above held until renal function improves back to baseline   Total encounter time more than 50 minutes  Greater than 50% was spent in counseling and coordination of care with the patient   For questions or updates, please contact [facility] Please consult [email or web address] for contact info under        Signed, Julien Nordmann, MD  05/05/2023, 11:40 AM

## 2023-05-05 NOTE — Plan of Care (Signed)
Patient is participating with goals of care to meet goals for discharge.  Terrilyn Saver, RN     Problem: Education: Goal: Knowledge of General Education information will improve Description: Including pain rating scale, medication(s)/side effects and non-pharmacologic comfort measures Outcome: Progressing   Problem: Health Behavior/Discharge Planning: Goal: Ability to manage health-related needs will improve Outcome: Progressing   Problem: Clinical Measurements: Goal: Ability to maintain clinical measurements within normal limits will improve Outcome: Progressing Goal: Will remain free from infection Outcome: Progressing Goal: Diagnostic test results will improve Outcome: Progressing Goal: Respiratory complications will improve Outcome: Progressing Goal: Cardiovascular complication will be avoided Outcome: Progressing   Problem: Activity: Goal: Risk for activity intolerance will decrease Outcome: Progressing   Problem: Nutrition: Goal: Adequate nutrition will be maintained Outcome: Progressing   Problem: Coping: Goal: Level of anxiety will decrease Outcome: Progressing   Problem: Elimination: Goal: Will not experience complications related to bowel motility Outcome: Progressing Goal: Will not experience complications related to urinary retention Outcome: Progressing   Problem: Pain Managment: Goal: General experience of comfort will improve Outcome: Progressing   Problem: Safety: Goal: Ability to remain free from injury will improve Outcome: Progressing   Problem: Skin Integrity: Goal: Risk for impaired skin integrity will decrease Outcome: Progressing   Problem: Education: Goal: Will demonstrate proper wound care and an understanding of methods to prevent future damage Outcome: Progressing Goal: Knowledge of disease or condition will improve Outcome: Progressing Goal: Knowledge of the prescribed therapeutic regimen will improve Outcome:  Progressing Goal: Individualized Educational Video(s) Outcome: Progressing   Problem: Activity: Goal: Risk for activity intolerance will decrease Outcome: Progressing   Problem: Cardiac: Goal: Will achieve and/or maintain hemodynamic stability Outcome: Progressing   Problem: Clinical Measurements: Goal: Postoperative complications will be avoided or minimized Outcome: Progressing   Problem: Respiratory: Goal: Respiratory status will improve Outcome: Progressing   Problem: Skin Integrity: Goal: Wound healing without signs and symptoms of infection Outcome: Progressing Goal: Risk for impaired skin integrity will decrease Outcome: Progressing   Problem: Urinary Elimination: Goal: Ability to achieve and maintain adequate renal perfusion and functioning will improve Outcome: Progressing   Problem: Education: Goal: Understanding of CV disease, CV risk reduction, and recovery process will improve Outcome: Progressing Goal: Individualized Educational Video(s) Outcome: Progressing   Problem: Activity: Goal: Ability to return to baseline activity level will improve Outcome: Progressing   Problem: Cardiovascular: Goal: Ability to achieve and maintain adequate cardiovascular perfusion will improve Outcome: Progressing Goal: Vascular access site(s) Level 0-1 will be maintained Outcome: Progressing   Problem: Health Behavior/Discharge Planning: Goal: Ability to safely manage health-related needs after discharge will improve Outcome: Progressing

## 2023-05-05 NOTE — Progress Notes (Signed)
\ PROGRESS NOTE    Ronnie Mann  BMW:[id number] DOB: 03-17-1956 DOA: 04/28/2023 PCP: Oneita Hurt, No    Brief Narrative:  Ronnie Mann is a 67 y.o. male with medical history significant of  nephrolithiasis requiring multiple surgeries and lithotripsies in the past.  He reports right-sided kidney pain that has been ongoing and related to that for a long time.  He also reports URI congestion and runny nose for the last week prior to admission.  He reports new onset of substernal "vice like" substernal chest pain that radiated to his left arm.  He reports ongoing shortness of breath associated with this.  He also was awoken from sleep with this last night and had broken out into a cold sweat.  In the ED he was noted to have an elevated lactic acid and elevated WBC at 14.4.  He was given a liter of IV fluids.  Chest x-ray was done and showed pulmonary edema and his BNP said came back at 530.  He was not given further IV fluid boluses.  His initial troponin was 54 rose to 234.  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  Additionally patient was noted to have a CBG of 298 and did report polyuria and polydipsia at home.    Assessment & Plan:   Principal Problem:   CHF (congestive heart failure) (HCC) Active Problems:   Elevated troponin   Substernal chest pain   Staghorn calculus   Lactic acidosis   AKI (acute kidney injury) (HCC)   Hydronephrosis of left kidney   Wheezing   Essential hypertension   Hiatal hernia   Cholelithiasis   Shortness of breath   Viral URI   Non-ST elevation (NSTEMI) myocardial infarction (HCC)   Acute systolic heart failure (HCC)   Acute hyperglycemia   Ischemic cardiomyopathy   Coronary artery disease of  native artery of native heart with stable angina pectoris (HCC)  Acute systolic CHF (congestive heart failure) (HCC) Secondary to ischemic cardiomyopathy Ejection fraction 30 to 35% Developed flash pulmonary edema during left heart cath 06/10 and was treated with nitroglycerin drip Plan: Diuresis on hold in the setting of deteriorating kidney function.  Is markedly net negative.  Recheck creatinine in a.m.  If kidney function back to baseline can consider discharge.  AKI Likely in the setting of aggressive diuresis.  Lasix given 6/13 AM.  None since.  Diuretics on hold.  Received 500 cc bolus.  Recheck creatinine in a.m.  Consider discharge 6/15 if creatinine back to baseline  Acute non-ST elevation MI Patient presented for evaluation of chest pain over the left anterior chest wall with radiation to the left arm Status post successful balloon angioplasty to the proximal LAD and drug-eluting stent placement to the proximal right coronary artery. Plan: Continue aspirin, high intensity statin, carvedilol and Brilinta   History of nephrolithiasis/left hydronephrosis/staghorn calculi Sepsis from a urinary source Patient noted to have significant pyuria, tachycardia, tachypnea, lactic acidosis and leukocytosis Urine culture is sterile Patient was treated empirically with Rocephin No plans for inpatient urologic intervention Follow-up with urology as an outpatient   Community-acquired pneumonia Completed IV Rocephin and Zithromax No further antibiotics Monitor vitals and fever curve   Hyperglycemia in the setting of acute non-ST  elevation MI No known history of diabetes mellitus Random CBG of 298 on admission Started on insulin Hemoglobin A1c is 5.5 Will discontinue insulin due to increased risk for hypoglycemia Maintain consistent carbohydrate diet Consider SGLT2 inhibitor on DC     DVT prophylaxis: SQ Lovenox Code Status: Full Family Communication: None Disposition Plan:  Status is: Inpatient Remains inpatient appropriate because: Decompensated heart failure   Level of care: Progressive  Consultants:  Heart failure/cardiology  Procedures:  Left heart catheterization  Antimicrobials: None   Subjective: Seen and examined.  Sitting up in bed.  Overall feels well.  Had some episode of hypotension of unclear etiology last night. Objective: Vitals:   05/04/23 2132 05/04/23 2354 05/05/23 0451 05/05/23 0945  BP: (!) 94/53 (!) 82/63 96/79 (!) 82/52  Pulse:   74 89  Resp:  18 19 16   Temp:  97.7 F (36.5 C) (!) 97.5 F (36.4 C)   TempSrc:  Axillary Axillary   SpO2:  100% 100% 100%  Weight:      Height:        Intake/Output Summary (Last 24 hours) at 05/05/2023 1156 Last data filed at 05/05/2023 0915 Gross per 24 hour  Intake 590 ml  Output 1270 ml  Net -680 ml   Filed Weights   04/28/23 1112  Weight: 95.3 kg    Examination:  General exam: No acute distress Respiratory system: Lungs clear.  Normal work of breathing.  Room air Cardiovascular system: S1-S2, RRR, no murmurs, trace pedal edema Gastrointestinal system: Soft, NT/ND, normal bowel sounds Central nervous system: Alert and oriented. No focal neurological deficits. Extremities: Symmetric 5 x 5 power. Skin: No rashes, lesions or ulcers Psychiatry: Judgement and insight appear normal. Mood & affect appropriate.     Data Reviewed: I have personally reviewed following labs and imaging studies  CBC: Recent Labs  Lab 04/29/23 0507 04/30/23 0615 05/01/23 0435 05/02/23 0413  WBC 10.9* 7.6 7.3 6.5  HGB 11.6* 10.5* 10.3* 9.7*  HCT 35.3* 31.6* 31.8* 28.8*  MCV 98.6 97.8 99.7 98.6  PLT 182 164 160 157   Basic Metabolic Panel: Recent Labs  Lab 04/29/23 0507 04/30/23 0615 04/30/23 1154 05/01/23 0434 05/02/23 0413 05/04/23 1136 05/05/23 0701  NA 139  --  142  --  143 138 135  K 4.4  --  4.2  --  3.7 3.9 3.9  CL 108  --  108  --  107 103 100  CO2 22  --  24  --  28 23 23    GLUCOSE 146*  --  124*  --  120* 245* 199*  BUN 27*  --  27*  --  28* 69* 96*  CREATININE 0.93  --  0.96 0.95 1.00 1.14 1.84*  CALCIUM 8.8*  --  8.6*  --  8.6* 9.6 9.1  MG  --  2.0  --   --   --   --  2.3  PHOS  --  2.7  --   --   --   --   --    GFR: Estimated Creatinine Clearance: 44.4 mL/min (A) (by C-G formula based on SCr of 1.84 mg/dL (H)). Liver Function Tests: Recent Labs  Lab 04/29/23 0507  AST 122*  ALT 30  ALKPHOS 93  BILITOT 1.1  PROT 7.1  ALBUMIN 4.1   No results for input(s): "LIPASE", "AMYLASE" in the last 168 hours.  No results for input(s): "AMMONIA" in the last 168 hours. Coagulation Profile: Recent Labs  Lab 04/29/23 0507  INR 1.1   Cardiac Enzymes: No results for input(s): "CKTOTAL", "CKMB", "CKMBINDEX", "TROPONINI" in the last 168 hours. BNP (last 3 results) No results for input(s): "PROBNP" in the last 8760 hours. HbA1C: No results for input(s): "HGBA1C" in the last 72 hours. CBG: Recent Labs  Lab 05/04/23 0806 05/04/23 1221 05/04/23 1625 05/04/23 2021 05/05/23 0940  GLUCAP 146* 178* 174* 162* 198*   Lipid Profile: No results for input(s): "CHOL", "HDL", "LDLCALC", "TRIG", "CHOLHDL", "LDLDIRECT" in the last 72 hours. Thyroid Function Tests: No results for input(s): "TSH", "T4TOTAL", "FREET4", "T3FREE", "THYROIDAB" in the last 72 hours. Anemia Panel: No results for input(s): "VITAMINB12", "FOLATE", "FERRITIN", "TIBC", "IRON", "RETICCTPCT" in the last 72 hours. Sepsis Labs: Recent Labs  Lab 04/28/23 1347  LATICACIDVEN 2.6*    Recent Results (from the past 240 hour(s))  Blood culture (routine x 2)     Status: None   Collection Time: 04/28/23 11:30 AM   Specimen: BLOOD  Result Value Ref Range Status   Specimen Description BLOOD BLOOD LEFT FOREARM  Final   Special Requests   Final    BOTTLES DRAWN AEROBIC AND ANAEROBIC Blood Culture adequate volume   Culture   Final    NO GROWTH 5 DAYS Performed at [facility], [address] [place], [place] [address]    Report Status 05/03/2023 FINAL  Final  Blood culture (routine x 2)     Status: None   Collection Time: 04/28/23 11:30 AM   Specimen: BLOOD  Result Value Ref Range Status   Specimen Description BLOOD BLOOD RIGHT FOREARM  Final   Special Requests   Final    BOTTLES DRAWN AEROBIC AND ANAEROBIC Blood Culture results may not be optimal due to an excessive volume of blood received in culture bottles   Culture   Final    NO GROWTH 5 DAYS Performed at [facility], [address] [place], [place] [address]    Report Status 05/03/2023 FINAL  Final  SARS Coronavirus 2 by RT PCR (hospital order, performed in [facility] hospital lab) *cepheid single result test* Anterior Nasal Swab     Status: None   Collection Time: 04/28/23 12:09 PM   Specimen: Anterior Nasal Swab  Result Value Ref Range Status   SARS Coronavirus 2 by RT PCR NEGATIVE NEGATIVE Final    Comment: (NOTE) SARS-CoV-2 target nucleic acids are NOT DETECTED.  The SARS-CoV-2 RNA is generally detectable in upper and lower respiratory specimens during the acute phase of infection. The lowest concentration of SARS-CoV-2 viral copies this assay can detect is 250 copies / mL. A negative result does not preclude SARS-CoV-2 infection and should not be used as the sole basis for treatment or other patient management decisions.  A negative result may occur with improper specimen collection / handling, submission of specimen other than nasopharyngeal swab, presence of viral mutation(s) within the areas targeted by this assay, and inadequate number of viral copies (<250 copies / mL). A negative result must be combined with clinical observations, patient history, and epidemiological information.  Fact Sheet for Patients:   RoadLapTop.co.za  Fact Sheet for Healthcare Providers: http://kim-miller.com/  This test is not yet approved or  cleared by  the [place] FDA and has been authorized for detection and/or diagnosis of SARS-CoV-2 by FDA under an Emergency Use Authorization (EUA).  This EUA will remain in effect (meaning this test can be used) for the duration of the COVID-19 declaration under Section 564(b)(1) of the Act, 21 U.S.C. section  360bbb-3(b)(1), unless the authorization is terminated or revoked sooner.  Performed at [facility], [address] [place], [place] [address]   Respiratory (~20 pathogens) panel by PCR     Status: None   Collection Time: 04/28/23  1:24 PM   Specimen: Nasopharyngeal Swab; Respiratory  Result Value Ref Range Status   Adenovirus NOT DETECTED NOT DETECTED Final   Coronavirus 229E NOT DETECTED NOT DETECTED Final    Comment: (NOTE) The Coronavirus on the Respiratory Panel, DOES NOT test for the novel  Coronavirus (2019 nCoV)    Coronavirus HKU1 NOT DETECTED NOT DETECTED Final   Coronavirus NL63 NOT DETECTED NOT DETECTED Final   Coronavirus OC43 NOT DETECTED NOT DETECTED Final   Metapneumovirus NOT DETECTED NOT DETECTED Final   Rhinovirus / Enterovirus NOT DETECTED NOT DETECTED Final   Influenza A NOT DETECTED NOT DETECTED Final   Influenza B NOT DETECTED NOT DETECTED Final   Parainfluenza Virus 1 NOT DETECTED NOT DETECTED Final   Parainfluenza Virus 2 NOT DETECTED NOT DETECTED Final   Parainfluenza Virus 3 NOT DETECTED NOT DETECTED Final   Parainfluenza Virus 4 NOT DETECTED NOT DETECTED Final   Respiratory Syncytial Virus NOT DETECTED NOT DETECTED Final   Bordetella pertussis NOT DETECTED NOT DETECTED Final   Bordetella Parapertussis NOT DETECTED NOT DETECTED Final   Chlamydophila pneumoniae NOT DETECTED NOT DETECTED Final   Mycoplasma pneumoniae NOT DETECTED NOT DETECTED Final    Comment: Performed at [facility], [address], [place], [place] [address]  MRSA Next Gen by PCR, Nasal     Status: None   Collection Time: 04/28/23  5:07 PM   Specimen: Nasal Mucosa;  Nasal Swab  Result Value Ref Range Status   MRSA by PCR Next Gen NOT DETECTED NOT DETECTED Final    Comment: (NOTE) The GeneXpert MRSA Assay (FDA approved for NASAL specimens only), is one component of a comprehensive MRSA colonization surveillance program. It is not intended to diagnose MRSA infection nor to guide or monitor treatment for MRSA infections. Test performance is not FDA approved in patients less than 74 years old. Performed at [facility], [address], [place], [place] [address]   Urine Culture     Status: None   Collection Time: 04/29/23 10:00 AM   Specimen: Urine, Random  Result Value Ref Range Status   Specimen Description   Final    URINE, RANDOM Performed at [facility], [address] [place], [place] [address]    Special Requests   Final    NONE Reflexed from [id number] Performed at [facility], [address] [place], [place] [address]    Culture   Final    NO GROWTH Performed at [facility], [address], [place], [place] [address]    Report Status 05/01/2023 FINAL  Final         Radiology Studies: No results found.      Scheduled Meds:  aspirin EC  81 mg Oral Daily   atorvastatin  40 mg Oral Daily   enoxaparin (LOVENOX) injection  40 mg Subcutaneous Q24H   sodium chloride flush  3 mL Intravenous Q12H   tamsulosin  0.4 mg Oral QPC breakfast   ticagrelor  90 mg Oral BID   Continuous Infusions:   LOS: 7 days     Tresa Moore, MD [facility]   If 7PM-7AM, please contact night-coverage  05/05/2023, 11:56 AM

## 2023-05-05 NOTE — Progress Notes (Signed)
Transition of Care [facility]) - Inpatient Brief Assessment   Patient Details  Name: Ronnie Mann MRN: [id number] Date of Birth: 12-Feb-1956  Transition of Care [facility]) CM/SW Contact:    Truddie Hidden, RN Phone Number: 05/05/2023, 2:03 PM   Clinical Narrative: TOC continues to provide ongoing  assessment for needs and discharge planning.   Transition of Care Asessment: Insurance and Status: Insurance coverage has been reviewed Patient has primary care physician: Yes Home environment has been reviewed: Return to home Prior level of function:: independent Prior/Current Home Services: No current home services Social Determinants of Health Reivew: SDOH reviewed no interventions necessary Readmission risk has been reviewed: Yes Transition of care needs: no transition of care needs at this time

## 2023-05-06 DIAGNOSIS — I5021 Acute systolic (congestive) heart failure: Secondary | ICD-10-CM | POA: Diagnosis not present

## 2023-05-06 LAB — URINALYSIS, ROUTINE W REFLEX MICROSCOPIC
Bilirubin Urine: NEGATIVE
Glucose, UA: NEGATIVE mg/dL
Ketones, ur: NEGATIVE mg/dL
Nitrite: NEGATIVE
Protein, ur: 100 mg/dL — AB
RBC / HPF: >50 RBC/hpf (ref 0–5)
Specific Gravity, Urine: 1.012 (ref 1.005–1.030)
WBC, UA: >50 WBC/hpf (ref 0–5)
pH: 6 (ref 5.0–8.0)

## 2023-05-06 LAB — CBC WITH DIFFERENTIAL/PLATELET
Abs Immature Granulocytes: 0.03 10*3/uL (ref 0.00–0.07)
Basophils Absolute: 0 10*3/uL (ref 0.0–0.1)
Basophils Relative: 1 %
Eosinophils Absolute: 0.3 10*3/uL (ref 0.0–0.5)
Eosinophils Relative: 4 %
HCT: 24.1 % — ABNORMAL LOW (ref 39.0–52.0)
Hemoglobin: 8.1 g/dL — ABNORMAL LOW (ref 13.0–17.0)
Immature Granulocytes: 0 %
Lymphocytes Relative: 19 %
Lymphs Abs: 1.4 10*3/uL (ref 0.7–4.0)
MCH: 33.1 pg (ref 26.0–34.0)
MCHC: 33.6 g/dL (ref 30.0–36.0)
MCV: 98.4 fL (ref 80.0–100.0)
Monocytes Absolute: 0.6 10*3/uL (ref 0.1–1.0)
Monocytes Relative: 8 %
Neutro Abs: 5.3 10*3/uL (ref 1.7–7.7)
Neutrophils Relative %: 68 %
Platelets: 179 10*3/uL (ref 150–400)
RBC: 2.45 MIL/uL — ABNORMAL LOW (ref 4.22–5.81)
RDW: 14.1 % (ref 11.5–15.5)
WBC: 7.7 10*3/uL (ref 4.0–10.5)
nRBC: 0 % (ref 0.0–0.2)

## 2023-05-06 LAB — LIPID PANEL
Cholesterol: 141 mg/dL (ref 0–200)
HDL: 24 mg/dL — ABNORMAL LOW (ref >40)
LDL Cholesterol: 72 mg/dL (ref 0–99)
Total CHOL/HDL Ratio: 5.9 RATIO
Triglycerides: 227 mg/dL — ABNORMAL HIGH (ref <150)
VLDL: 45 mg/dL — ABNORMAL HIGH (ref 0–40)

## 2023-05-06 LAB — BASIC METABOLIC PANEL
Anion gap: 10 (ref 5–15)
BUN: 85 mg/dL — ABNORMAL HIGH (ref 8–23)
CO2: 23 mmol/L (ref 22–32)
Calcium: 8.9 mg/dL (ref 8.9–10.3)
Chloride: 102 mmol/L (ref 98–111)
Creatinine, Ser: 1.55 mg/dL — ABNORMAL HIGH (ref 0.61–1.24)
GFR, Estimated: 49 mL/min — ABNORMAL LOW (ref >60)
Glucose, Bld: 150 mg/dL — ABNORMAL HIGH (ref 70–99)
Potassium: 4.1 mmol/L (ref 3.5–5.1)
Sodium: 135 mmol/L (ref 135–145)

## 2023-05-06 LAB — PREPARE RBC (CROSSMATCH)

## 2023-05-06 LAB — TYPE AND SCREEN: ABO/RH(D): A POS

## 2023-05-06 LAB — BPAM RBC
Blood Product Expiration Date: 202407162359
ISSUE DATE / TIME: 202406151437

## 2023-05-06 LAB — ABO/RH: ABO/RH(D): A POS

## 2023-05-06 LAB — GLUCOSE, CAPILLARY
Glucose-Capillary: 158 mg/dL — ABNORMAL HIGH (ref 70–99)
Glucose-Capillary: 161 mg/dL — ABNORMAL HIGH (ref 70–99)
Glucose-Capillary: 217 mg/dL — ABNORMAL HIGH (ref 70–99)

## 2023-05-06 MED ORDER — ATORVASTATIN CALCIUM 40 MG PO TABS: 40.0000 mg | ORAL_TABLET | Freq: Every day | ORAL | 0 refills | Status: DC

## 2023-05-06 MED ORDER — TICAGRELOR 90 MG PO TABS: 90.0000 mg | ORAL_TABLET | Freq: Two times a day (BID) | ORAL | 0 refills | Status: DC

## 2023-05-06 MED ORDER — ALPRAZOLAM 0.5 MG PO TABS: 0.5000 mg | ORAL_TABLET | Freq: Three times a day (TID) | ORAL | 0 refills | Status: DC | PRN

## 2023-05-06 MED ORDER — ASPIRIN 81 MG PO TBEC: 81.0000 mg | DELAYED_RELEASE_TABLET | Freq: Every day | ORAL | 12 refills | Status: DC

## 2023-05-06 MED ORDER — SODIUM CHLORIDE 0.9% IV SOLUTION: Freq: Once | INTRAVENOUS | Status: AC

## 2023-05-06 MED ORDER — TAMSULOSIN HCL 0.4 MG PO CAPS: 0.4000 mg | ORAL_CAPSULE | Freq: Every day | ORAL | 0 refills | Status: DC

## 2023-05-06 NOTE — TOC Transition Note (Signed)
Transition of Care [facility]) - CM/SW Discharge Note   Patient Details  Name: Ronnie Mann MRN: [id number] Date of Birth: 1956/05/03  [facility]) CM/SW Contact:  Bing Quarry, RN Phone Number: 05/06/2023, 3:19 PM   Clinical Narrative: 6/15: Discharge orders in. Noted no PCP listed. PCP list added to AVS per TOC patient instructions. Gabriel Cirri RN CM      Final next level of care: Home/Self Care     Patient Goals and CMS Choice      Discharge Placement                         Discharge Plan and Services Additional resources added to the After Visit Summary for                                       Social Determinants of Health (SDOH) Interventions SDOH Screenings   Food Insecurity: No Food Insecurity (05/05/2023)  Housing: Low Risk  (05/05/2023)  Transportation Needs: No Transportation Needs (05/05/2023)  Utilities: Not At Risk (05/05/2023)  Tobacco Use: Low Risk  (05/01/2023)     Readmission Risk Interventions     No data to display

## 2023-05-06 NOTE — Discharge Summary (Signed)
Physician Discharge Summary  RASHARD SHUTT ZOX:[id number] DOB: [birth year] DOA: 04/28/2023  PCP: Oneita Hurt, No  Admit date: 04/28/2023 Discharge date: 05/06/2023  Admitted From: Home Disposition:  Home  Recommendations for Outpatient Follow-up:  Follow up with PCP in 1-2 weeks Follow up with cardiology on 6/25  Home Health:No Equipment/Devices:none   Discharge Condition:Stable  CODE STATUS:FULL  Diet recommendation: Heart  Brief/Interim Summary: BERWYN RIM is a 67 y.o. male with medical history significant of  nephrolithiasis requiring multiple surgeries and lithotripsies in the past.  He reports right-sided kidney pain that has been ongoing and related to that for a long time.  He also reports URI congestion and runny nose for the last week prior to admission.  He reports new onset of substernal "vice like" substernal chest pain that radiated to his left arm.  He reports ongoing shortness of breath associated with this.  He also was awoken from sleep with this last night and had broken out into a cold sweat.  In the ED he was noted to have an elevated lactic acid and elevated WBC at 14.4.  He was given a liter of IV fluids.  Chest x-ray was done and showed pulmonary edema and his BNP said came back at 530.  He was not given further IV fluid boluses.  His initial troponin was 54 rose to 234.  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  Additionally patient was noted to have a CBG of 298 and did report polyuria and polydipsia at home.     Discharge Diagnoses:  Principal Problem:   CHF (congestive heart failure) (HCC) Active Problems:   Elevated troponin   Substernal chest pain   Staghorn calculus   Lactic acidosis   AKI (acute  kidney injury) (HCC)   Hydronephrosis of left kidney   Wheezing   Essential hypertension   Hiatal hernia   Cholelithiasis   Shortness of breath   Viral URI   Non-ST elevation (NSTEMI) myocardial infarction (HCC)   Acute systolic heart failure (HCC)   Acute hyperglycemia   Ischemic cardiomyopathy   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 heart with stable angina pectoris (HCC)  Acute systolic CHF (congestive heart failure) (HCC) Secondary to ischemic cardiomyopathy Ejection fraction 30 to 35% Developed flash pulmonary edema during left heart cath 06/10 and was treated with nitroglycerin drip Plan: Diuresis on hold in the setting of deteriorating kidney function.  Is markedly net negative.  Creat improving.  No diuretics on dc.  Consider diuretic therapy after cardiology office visit on 6/25   AKI Likely in the setting of aggressive diuresis.  Lasix given 6/13 AM.  None since.  Diuretics on hold.  Entresto on hold.  Cr improving   Acute non-ST elevation MI Patient presented for evaluation of chest pain over the left anterior chest wall with radiation to the left arm Status post successful balloon angioplasty to the proximal LAD and drug-eluting stent placement to the proximal right coronary artery. Plan: Continue aspirin, high intensity statin, Brilinta Coreg not able to start due to hypotension   History of nephrolithiasis/left hydronephrosis/staghorn calculi Sepsis from a urinary source Patient noted to have significant pyuria, tachycardia, tachypnea, lactic acidosis and leukocytosis Urine culture is sterile Patient was treated  empirically with Rocephin No plans for inpatient urologic intervention Follow-up with urology as an outpatient   Community-acquired pneumonia Completed IV Rocephin and Zithromax No further antibiotics Monitor vitals and fever curve   Hyperglycemia in the setting of acute non-ST elevation MI No known history of diabetes mellitus Random  CBG of 298 on admission Started on insulin Hemoglobin A1c is 5.5 Consider SGLT2 inhibitor as outpatient  Discharge Instructions  Discharge Instructions     AMB Referral to Cardiac Rehabilitation - Phase II   Complete by: As directed    Diagnosis:  NSTEMI Coronary Stents     After initial evaluation and assessments completed: Virtual Based Care may be provided alone or in conjunction with Phase 2 Cardiac Rehab based on patient barriers.: Yes   Intensive Cardiac Rehabilitation (ICR) [facility] location only OR Traditional Cardiac Rehabilitation (TCR) *If criteria for ICR are not met will enroll in TCR [facility] only): Yes   Diet - low sodium heart healthy   Complete by: As directed    Increase activity slowly   Complete by: As directed       Allergies as of 05/06/2023   No Known Allergies      Medication List     TAKE these medications    ALPRAZolam 0.5 MG tablet Commonly known as: XANAX Take 1 tablet (0.5 mg total) by mouth 3 (three) times daily as needed for anxiety.   aspirin EC 81 MG tablet Take 1 tablet (81 mg total) by mouth daily. Swallow whole. Start taking on: May 07, 2023   atorvastatin 40 MG tablet Commonly known as: LIPITOR Take 1 tablet (40 mg total) by mouth daily.   tamsulosin 0.4 MG Caps capsule Commonly known as: FLOMAX Take 1 capsule (0.4 mg total) by mouth daily after breakfast. Start taking on: May 07, 2023   ticagrelor 90 MG Tabs tablet Commonly known as: BRILINTA Take 1 tablet (90 mg total) by mouth 2 (two) times daily.        No Known Allergies  Consultations: Cardiology   Procedures/Studies: CARDIAC CATHETERIZATION  Result Date: 05/01/2023   Prox LAD to Mid LAD lesion is 99% stenosed.   Mid LAD lesion is 60% stenosed.   1st Diag lesion is 70% stenosed.   Prox RCA lesion is 95% stenosed.   Mid RCA lesion is 30% stenosed.   RPDA lesion is 60% stenosed.   Dist LAD lesion is 99% stenosed.   A drug-eluting stent was successfully placed using a  STENT ONYX FRONTIER [id number].   Balloon angioplasty was performed using a BALLN [place] EUPHORA RX [id number].   Post intervention, there is a 20% residual stenosis.   Post intervention, there is a 0% residual stenosis.   There is severe left ventricular systolic dysfunction.   LV end diastolic pressure is severely elevated.   There is moderate (3+) mitral regurgitation. 1.  Severe two-vessel coronary artery disease with subtotal occlusion of the proximal LAD, diffuse moderate mid LAD disease and severe distal LAD disease.  In addition, there is 95% in the proximal right coronary artery with faint right to left collaterals to the LAD.  The left circumflex has mild nonobstructive disease and OM 3 is large and reaches the apex. 2.  Severely reduced LV systolic function with an EF of 25%.  Severely elevated left ventricular end-diastolic pressure at 34 mmHg. 3.  Successful balloon angioplasty to the proximal LAD and drug-eluting stent placement to the proximal right coronary artery. Recommendations: The patient developed progressive pulmonary edema as the  case progressed.  He was given 1 dose of IV furosemide and was started on nitroglycerin drip.  He will be started on BiPAP. I elected not to place a stent in the LAD given poor distal flow and diffuse disease overall.  In addition, placing a stent would risk closing the first diagonal. Recommend optimizing his heart failure and volume overload and consider relook angiography in the near future once he is optimized. I consulted advanced heart failure.   ECHOCARDIOGRAM COMPLETE  Result Date: 04/30/2023    ECHOCARDIOGRAM REPORT   Patient Name:   RAJINDER DOBBERSTEIN Date of Exam: 04/30/2023 Medical Rec #:  [id number]    Height:       69.0 in Accession #:    [id number]   Weight:       210.0 lb Date of Birth:  June 08, 1956    BSA:          2.109 m Patient Age:    63 years     BP:           103/76 mmHg Patient Gender: M            HR:           75 bpm. Exam Location:  [facility] Procedure: 2D Echo,  Color Doppler, Cardiac Doppler and Intracardiac            Opacification Agent Indications:     R07.9 Chest Pain  History:         Patient has no prior history of Echocardiogram examinations. No                  cardiac history on file.  Sonographer:     L. Thornton-Maynard Referring Phys:  [id number] Shelbie Proctor PRATT Diagnosing Phys: Julien Nordmann MD  Sonographer Comments: Suboptimal apical window. IMPRESSIONS  1. Left ventricular ejection fraction, by estimation, is 30 to 35%. Left ventricular ejection fraction by PLAX is 32 %. The left ventricle has moderately decreased function. The left ventricle demonstrates global hypokinesis with severe hypokinesis of the anterior, anteroseptal and apical region. The left ventricular internal cavity size was mildly dilated. There is mild left ventricular hypertrophy. Left ventricular diastolic parameters are consistent with Grade II diastolic dysfunction (pseudonormalization).  2. Right ventricular systolic function is normal. The right ventricular size is normal. There is normal pulmonary artery systolic pressure.  3. The mitral valve is normal in structure. Moderate mitral valve regurgitation. No evidence of mitral stenosis.  4. The aortic valve is normal in structure. Aortic valve regurgitation is not visualized. No aortic stenosis is present.  5. There is mild dilatation of the ascending aorta, measuring 42 mm.  6. The inferior vena cava is normal in size with <50% respiratory variability, suggesting right atrial pressure of 8 mmHg. FINDINGS  Left Ventricle: Left ventricular ejection fraction, by estimation, is 30 to 35%. Left ventricular ejection fraction by PLAX is 32 %. The left ventricle has moderately decreased function. The left ventricle demonstrates global hypokinesis. Definity contrast agent was given IV to delineate the left ventricular endocardial borders. The left ventricular internal cavity size was mildly dilated. There is mild left ventricular hypertrophy. Left  ventricular diastolic parameters are consistent with Grade II diastolic dysfunction (pseudonormalization). Right Ventricle: The right ventricular size is normal. No increase in right ventricular wall thickness. Right ventricular systolic function is normal. There is normal pulmonary artery systolic pressure. The tricuspid regurgitant velocity is 2.00 m/s, and  with an assumed right atrial pressure of 3 mmHg, the  estimated right ventricular systolic pressure is 19.0 mmHg. Left Atrium: Left atrial size was normal in size. Right Atrium: Right atrial size was normal in size. Pericardium: There is no evidence of pericardial effusion. Mitral Valve: The mitral valve is normal in structure. There is mild thickening of the mitral valve leaflet(s). Moderate mitral valve regurgitation. No evidence of mitral valve stenosis. MV peak gradient, 5.9 mmHg. The mean mitral valve gradient is 3.0 mmHg. Tricuspid Valve: The tricuspid valve is normal in structure. Tricuspid valve regurgitation is not demonstrated. No evidence of tricuspid stenosis. Aortic Valve: The aortic valve is normal in structure. Aortic valve regurgitation is not visualized. No aortic stenosis is present. Aortic valve mean gradient measures 3.0 mmHg. Aortic valve peak gradient measures 4.9 mmHg. Aortic valve area, by VTI measures 1.91 cm. Pulmonic Valve: The pulmonic valve was normal in structure. Pulmonic valve regurgitation is not visualized. No evidence of pulmonic stenosis. Aorta: The aortic root is normal in size and structure. There is mild dilatation of the ascending aorta, measuring 42 mm. Venous: The inferior vena cava is normal in size with less than 50% respiratory variability, suggesting right atrial pressure of 8 mmHg. IAS/Shunts: No atrial level shunt detected by color flow Doppler.  LEFT VENTRICLE PLAX 2D LV EF:         Left            Diastology                ventricular     LV e' medial:    6.53 cm/s                ejection        LV E/e' medial:   16.8                fraction by     LV e' lateral:   6.74 cm/s                PLAX is 32      LV E/e' lateral: 16.3                %. LVIDd:         6.40 cm LVIDs:         5.40 cm LV PW:         1.30 cm LV IVS:        1.30 cm LVOT diam:     2.03 cm LV SV:         38 LV SV Index:   18 LVOT Area:     3.25 cm  LV Volumes (MOD) LV vol d, MOD    191.0 ml A2C: LV vol d, MOD    206.0 ml A4C: LV vol s, MOD    145.0 ml A2C: LV vol s, MOD    108.0 ml A4C: LV SV MOD A2C:   46.0 ml LV SV MOD A4C:   206.0 ml LV SV MOD BP:    72.7 ml RIGHT VENTRICLE             IVC RV Basal diam:  3.50 cm     IVC diam: 2.20 cm RV S prime:     10.30 cm/s TAPSE (M-mode): 2.6 cm LEFT ATRIUM           Index        RIGHT ATRIUM           Index LA diam:      4.30 cm 2.04 cm/m  RA Area:     17.70 cm LA Vol (A4C): 91.7 ml 43.47 ml/m  RA Volume:   43.90 ml  20.81 ml/m  AORTIC VALVE                    PULMONIC VALVE AV Area (Vmax):    2.26 cm     PV Vmax:       0.85 m/s AV Area (Vmean):   2.15 cm     PV Peak grad:  2.9 mmHg AV Area (VTI):     1.91 cm AV Vmax:           110.50 cm/s AV Vmean:          76.100 cm/s AV VTI:            0.200 m AV Peak Grad:      4.9 mmHg AV Mean Grad:      3.0 mmHg LVOT Vmax:         77.00 cm/s LVOT Vmean:        50.300 cm/s LVOT VTI:          0.118 m LVOT/AV VTI ratio: 0.59  AORTA Ao Root diam: 4.15 cm Ao Asc diam:  4.20 cm MITRAL VALVE                  TRICUSPID VALVE MV Area (PHT): 5.13 cm       TR Peak grad:   16.0 mmHg MV Area VTI:   1.33 cm       TR Vmax:        200.00 cm/s MV Peak grad:  5.9 mmHg MV Mean grad:  3.0 mmHg       SHUNTS MV Vmax:       1.21 m/s       Systemic VTI:  0.12 m MV Vmean:      77.7 cm/s      Systemic Diam: 2.03 cm MV Decel Time: 148 msec MR Peak grad:    109.8 mmHg MR Mean grad:    68.0 mmHg MR Vmax:         524.00 cm/s MR Vmean:        385.0 cm/s MR PISA:         2.26 cm MR PISA Eff ROA: 16 mm MR PISA Radius:  0.60 cm MV E velocity: 110.00 cm/s MV A velocity: 61.80 cm/s MV E/A ratio:   1.78 Julien Nordmann MD Electronically signed by Julien Nordmann MD Signature Date/Time: 04/30/2023/1:02:34 PM    Final    CT Renal Stone Study  Result Date: 04/28/2023 CLINICAL DATA:  CTA of the chest performed for left-sided chest pain demonstrated evidence of a staghorn calculus in the visualized upper right kidney and evidence of probable significant hydronephrosis of the visualized upper left kidney. EXAM: CT ABDOMEN AND PELVIS WITHOUT CONTRAST TECHNIQUE: Multidetector CT imaging of the abdomen and pelvis was performed following the standard protocol without IV contrast. RADIATION DOSE REDUCTION: This exam was performed according to the departmental dose-optimization program which includes automated exposure control, adjustment of the mA and/or kV according to patient size and/or use of iterative reconstruction technique. COMPARISON:  CT a of the chest earlier today. Prior CT of the abdomen and pelvis without contrast on 07/23/2011. FINDINGS: Lower chest: Scattered scarring and atelectasis at both lung bases. Large hiatal hernia. Hepatobiliary: The liver does not appear overtly cirrhotic by abdominal CT. At least 1 gallstone is identified in the gallbladder lumen. Gallbladder is decompressed with potentially  some fundal wall thickening which may implicate some chronic inflammation. No biliary ductal dilatation. Pancreas: Unremarkable. No pancreatic ductal dilatation or surrounding inflammatory changes. Spleen: Normal in size without focal abnormality. Adrenals/Urinary Tract: Evaluation of the kidneys is somewhat challenging due to the administration of IV contrast for CTA of the chest over 1 hour prior to the abdominal and pelvic CT. There is evidence of a staghorn calculus within the upper pole collecting system of the right kidney as well as an additional calculus in the lower pole. Some excretion of contrast is present surrounding calculi as well as in the interpolar kidney and contrast is also seen in the  right ureter. The right renal pelvis appears thickened and stenotic. This also appeared thickened in 2012 and may reflect chronic inflammation. Ultimately correlation with ureteroscopy may be beneficial in evaluation for potential underlying urothelial neoplasm. The left kidney demonstrates significant hydronephrosis with some partial excretion of contrast in the lower pole, a partial staghorn calculus in the lower pole measuring up to approximately 2.3 cm in greatest diameter and under obstructing calculus in the ureter at the juncture of proximal and mid thirds measuring 16 mm in height and 8 x 8 mm in transverse dimensions. Cortical thinning of the left kidney implicates a chronic obstruction. The bladder is filled with excreted contrast and is unremarkable in appearance. Stomach/Bowel: Bowel shows no evidence of obstruction, ileus, inflammation or lesion. The appendix is normal. No free intraperitoneal air. Vascular/Lymphatic: Atherosclerosis of the abdominal aorta without evidence of aneurysm. No lymphadenopathy identified. Reproductive: Prostate is unremarkable. Other: Small left inguinal hernia containing fat. No abdominopelvic ascites. Musculoskeletal: No acute or significant osseous findings. IMPRESSION: 1. 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 Partial staghorn calculus of the lower pole of the left kidney. 2. There is evidence of significant hydronephrosis of the left kidney with cortical thinning and an obstructing calculus in the ureter at the juncture of proximal and mid thirds measuring 16 mm in height and 8 x 8 mm in transverse dimensions. Cortical thinning implicates a chronic obstruction. 3. The right renal pelvis appears thickened and stenotic. This also appeared thickened in 2012 and may reflect chronic inflammation. Ultimately correlation with ureteroscopy may be beneficial in  evaluation for potential underlying urothelial neoplasm. 4. Cholelithiasis with decompressed gallbladder and potentially some fundal wall thickening which may implicate some chronic inflammation. 5. Large hiatal hernia. 6. Small left inguinal hernia containing fat. 7. Aortic atherosclerosis. Aortic Atherosclerosis (ICD10-I70.0). Electronically Signed   By: Irish Lack M.D.   On: 04/28/2023 14:54   CT Angio Chest PE W/Cm &/Or Wo Cm  Result Date: 04/28/2023 CLINICAL DATA:  Clinical concern for pulmonary embolism. EXAM: CT ANGIOGRAPHY CHEST WITH CONTRAST TECHNIQUE: Multidetector CT imaging of the chest was performed using the standard protocol during bolus administration of intravenous contrast. Multiplanar CT image reconstructions and MIPs were obtained to evaluate the vascular anatomy. RADIATION DOSE REDUCTION: This exam was performed according to the departmental dose-optimization program which includes automated exposure control, adjustment of the mA and/or kV according to patient size and/or use of iterative reconstruction technique. CONTRAST:  75mL OMNIPAQUE IOHEXOL 350 MG/ML SOLN COMPARISON:  None Available. FINDINGS: Cardiovascular: Heart is enlarged. No substantial pericardial effusion. Ascending thoracic aorta measures up to 4.2 cm diameter. Enlargement of the pulmonary outflow tract/main pulmonary arteries suggests pulmonary arterial hypertension. There is no filling defect within the opacified pulmonary arteries to suggest the  presence of an acute pulmonary embolus. Mediastinum/Nodes: Scattered upper normal mediastinal lymph nodes identified with 14 mm short axis precarinal node on 52/4. There is no hilar lymphadenopathy. Large hiatal hernia. The esophagus has normal imaging features. There is no axillary lymphadenopathy. Lungs/Pleura: Diffuse interlobular septal thickening evident with scattered areas of peripheral in central nodular/patchy ground-glass opacity. 19 mm ground-glass nodule identified  superior segment right lower lobe on 65/5. No dense focal airspace consolidation. Trace bilateral pleural effusions. Upper Abdomen: Nodular liver contour raises concern for cirrhosis. 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 Left kidney incompletely visualized but demonstrates hydronephrosis with overlying cortical thinning. Exophytic lesion upper pole right kidney measures 2.8 cm in approaches water density but has been incompletely visualized. 19 mm left adrenal adenoma. No followup imaging is recommended. Musculoskeletal: No worrisome lytic or sclerotic osseous abnormality. Review of the MIP images confirms the above findings. IMPRESSION: 1. No CT evidence for acute pulmonary embolus. 2. Diffuse interlobular septal thickening with scattered areas of peripheral and central nodular/patchy ground-glass opacity. Imaging features are most suggestive of pulmonary edema. 3. 19 mm ground-glass nodule superior segment right lower lobe. Potentially related to the edema. Non-contrast chest CT at 3-6 months is recommended. If the ground glass opacities persist, subsequent management will be based upon the most suspicious findings. This recommendation follows the consensus statement: Guidelines for Management of Incidental Pulmonary Nodules Detected on CT Images: From the Fleischner Society 2017; Radiology 2017; 281: 228-243. 4. Trace bilateral pleural effusions. 5. Nodular liver contour raises concern for cirrhosis. 6. 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 Left kidney incompletely visualized but demonstrates hydronephrosis with overlying cortical thinning. Urology consultation recommended. 7. 19 mm left adrenal adenoma. No followup imaging is recommended. Electronically Signed   By: Kennith Center M.D.   On: 04/28/2023 13:19   DG Chest 1 View  Result Date: 04/28/2023 CLINICAL DATA:  Shortness of breath  and chest pain. EXAM: CHEST  1 VIEW COMPARISON:  06/01/2009 FINDINGS: Stable top-normal heart size and visible large hiatal hernia. Diffuse pulmonary edema most likely reflects congestive heart failure. No significant pleural fluid identified at by frontal chest radiograph. No pneumothorax or focal airspace consolidation. IMPRESSION: Diffuse pulmonary edema most likely reflecting congestive heart failure. Large hiatal hernia. Electronically Signed   By: Irish Lack M.D.   On: 04/28/2023 12:16      Subjective: Seen and examined on the day of discharge.  Stable, no distress.  CP free  Discharge Exam: Vitals:   05/06/23 1432 05/06/23 1458  BP: (!) 104/56 92/68  Pulse: 76 86  Resp:  18  Temp:  97.9 F (36.6 C)  SpO2: 100% 100%   Vitals:   05/06/23 1315 05/06/23 1430 05/06/23 1432 05/06/23 1458  BP:  (!) 87/54 (!) 104/56 92/68  Pulse:  92 76 86  Resp:  16  18  Temp:  97.7 F (36.5 C)  97.9 F (36.6 C)  TempSrc:  Oral  Oral  SpO2: 100% 100% 100% 100%  Weight:      Height:        General: Pt is alert, awake, not in acute distress Cardiovascular: RRR, S1/S2 +, no rubs, no gallops Respiratory: CTA bilaterally, no wheezing, no rhonchi Abdominal: Soft, NT, ND, bowel sounds + Extremities: no edema, no cyanosis    The results of significant diagnostics from this hospitalization (including imaging, microbiology, ancillary and laboratory) are listed below  for reference.     Microbiology: Recent Results (from the past 240 hour(s))  Blood culture (routine x 2)     Status: None   Collection Time: 04/28/23 11:30 AM   Specimen: BLOOD  Result Value Ref Range Status   Specimen Description BLOOD BLOOD LEFT FOREARM  Final   Special Requests   Final    BOTTLES DRAWN AEROBIC AND ANAEROBIC Blood Culture adequate volume   Culture   Final    NO GROWTH 5 DAYS Performed at [facility], [address] [place], [place] [address]    Report Status 05/03/2023 FINAL  Final  Blood  culture (routine x 2)     Status: None   Collection Time: 04/28/23 11:30 AM   Specimen: BLOOD  Result Value Ref Range Status   Specimen Description BLOOD BLOOD RIGHT FOREARM  Final   Special Requests   Final    BOTTLES DRAWN AEROBIC AND ANAEROBIC Blood Culture results may not be optimal due to an excessive volume of blood received in culture bottles   Culture   Final    NO GROWTH 5 DAYS Performed at [facility], [address] [place], [place] [address]    Report Status 05/03/2023 FINAL  Final  SARS Coronavirus 2 by RT PCR (hospital order, performed in [facility] hospital lab) *cepheid single result test* Anterior Nasal Swab     Status: None   Collection Time: 04/28/23 12:09 PM   Specimen: Anterior Nasal Swab  Result Value Ref Range Status   SARS Coronavirus 2 by RT PCR NEGATIVE NEGATIVE Final    Comment: (NOTE) SARS-CoV-2 target nucleic acids are NOT DETECTED.  The SARS-CoV-2 RNA is generally detectable in upper and lower respiratory specimens during the acute phase of infection. The lowest concentration of SARS-CoV-2 viral copies this assay can detect is 250 copies / mL. A negative result does not preclude SARS-CoV-2 infection and should not be used as the sole basis for treatment or other patient management decisions.  A negative result may occur with improper specimen collection / handling, submission of specimen other than nasopharyngeal swab, presence of viral mutation(s) within the areas targeted by this assay, and inadequate number of viral copies (<250 copies / mL). A negative result must be combined with clinical observations, patient history, and epidemiological information.  Fact Sheet for Patients:   RoadLapTop.co.za  Fact Sheet for Healthcare Providers: http://kim-miller.com/  This test is not yet approved or  cleared by the [place] FDA and has been authorized for detection and/or diagnosis of  SARS-CoV-2 by FDA under an Emergency Use Authorization (EUA).  This EUA will remain in effect (meaning this test can be used) for the duration of the COVID-19 declaration under Section 564(b)(1) of the Act, 21 U.S.C. section 360bbb-3(b)(1), unless the authorization is terminated or revoked sooner.  Performed at [facility], [address] [place], [place] [address]   Respiratory (~20 pathogens) panel by PCR     Status: None   Collection Time: 04/28/23  1:24 PM   Specimen: Nasopharyngeal Swab; Respiratory  Result Value Ref Range Status   Adenovirus NOT DETECTED NOT DETECTED Final   Coronavirus 229E NOT DETECTED NOT DETECTED Final    Comment: (NOTE) The Coronavirus on the Respiratory Panel, DOES NOT test for the novel  Coronavirus (2019 nCoV)    Coronavirus HKU1 NOT DETECTED NOT DETECTED Final   Coronavirus NL63 NOT DETECTED NOT DETECTED Final   Coronavirus OC43 NOT DETECTED NOT DETECTED Final   Metapneumovirus NOT  DETECTED NOT DETECTED Final   Rhinovirus / Enterovirus NOT DETECTED NOT DETECTED Final   Influenza A NOT DETECTED NOT DETECTED Final   Influenza B NOT DETECTED NOT DETECTED Final   Parainfluenza Virus 1 NOT DETECTED NOT DETECTED Final   Parainfluenza Virus 2 NOT DETECTED NOT DETECTED Final   Parainfluenza Virus 3 NOT DETECTED NOT DETECTED Final   Parainfluenza Virus 4 NOT DETECTED NOT DETECTED Final   Respiratory Syncytial Virus NOT DETECTED NOT DETECTED Final   Bordetella pertussis NOT DETECTED NOT DETECTED Final   Bordetella Parapertussis NOT DETECTED NOT DETECTED Final   Chlamydophila pneumoniae NOT DETECTED NOT DETECTED Final   Mycoplasma pneumoniae NOT DETECTED NOT DETECTED Final    Comment: Performed at [facility], [address], [place], [place] [address]  MRSA Next Gen by PCR, Nasal     Status: None   Collection Time: 04/28/23  5:07 PM   Specimen: Nasal Mucosa; Nasal Swab  Result Value Ref Range Status   MRSA by PCR Next Gen NOT DETECTED  NOT DETECTED Final    Comment: (NOTE) The GeneXpert MRSA Assay (FDA approved for NASAL specimens only), is one component of a comprehensive MRSA colonization surveillance program. It is not intended to diagnose MRSA infection nor to guide or monitor treatment for MRSA infections. Test performance is not FDA approved in patients less than 77 years old. Performed at [facility], [address], [place], [place] [address]   Urine Culture     Status: None   Collection Time: 04/29/23 10:00 AM   Specimen: Urine, Random  Result Value Ref Range Status   Specimen Description   Final    URINE, RANDOM Performed at [facility], [address] [place], [place] [address]    Special Requests   Final    NONE Reflexed from [id number] Performed at [facility], [address] [place], [place] [address]    Culture   Final    NO GROWTH Performed at [facility], [address], [place], [place] [address]    Report Status 05/01/2023 FINAL  Final     Labs: BNP (last 3 results) Recent Labs    04/28/23 1130  BNP 530.9*   Basic Metabolic Panel: Recent Labs  Lab 04/30/23 0615 04/30/23 1154 05/02/23 0413 05/04/23 1136 05/05/23 0701 05/05/23 1616 05/06/23 0820  NA  --    < > 143 138 135 135 135  K  --    < > 3.7 3.9 3.9 3.9 4.1  CL  --    < > 107 103 100 99 102  CO2  --    < > 28 23 23 25 23   GLUCOSE  --    < > 120* 245* 199* 168* 150*  BUN  --    < > 28* 69* 96* 97* 85*  CREATININE  --    < > 1.00 1.14 1.84* 1.70* 1.55*  CALCIUM  --    < > 8.6* 9.6 9.1 9.1 8.9  MG 2.0  --   --   --  2.3  --   --   PHOS 2.7  --   --   --   --   --   --    < > = values in this interval not displayed.   Liver Function Tests: No results for input(s): "AST", "ALT", "ALKPHOS", "BILITOT", "PROT", "ALBUMIN" in the last 168 hours. No results for input(s): "LIPASE", "AMYLASE" in the last 168 hours. No results  for input(s): "AMMONIA" in the last 168 hours. CBC: Recent Labs   Lab 04/30/23 0615 05/01/23 0435 05/02/23 0413 05/06/23 0820  WBC 7.6 7.3 6.5 7.7  NEUTROABS  --   --   --  5.3  HGB 10.5* 10.3* 9.7* 8.1*  HCT 31.6* 31.8* 28.8* 24.1*  MCV 97.8 99.7 98.6 98.4  PLT 164 160 157 179   Cardiac Enzymes: No results for input(s): "CKTOTAL", "CKMB", "CKMBINDEX", "TROPONINI" in the last 168 hours. BNP: Invalid input(s): "POCBNP" CBG: Recent Labs  Lab 05/05/23 0940 05/05/23 1314 05/05/23 1832 05/06/23 0829 05/06/23 1208  GLUCAP 198* 233* 241* 158* 161*   D-Dimer No results for input(s): "DDIMER" in the last 72 hours. Hgb A1c No results for input(s): "HGBA1C" in the last 72 hours. Lipid Profile Recent Labs    05/06/23 0820  CHOL 141  HDL 24*  LDLCALC 72  TRIG 161*  CHOLHDL 5.9   Thyroid function studies No results for input(s): "TSH", "T4TOTAL", "T3FREE", "THYROIDAB" in the last 72 hours.  Invalid input(s): "FREET3" Anemia work up No results for input(s): "VITAMINB12", "FOLATE", "FERRITIN", "TIBC", "IRON", "RETICCTPCT" in the last 72 hours. Urinalysis    Component Value Date/Time   COLORURINE AMBER (A) 05/06/2023 0857   APPEARANCEUR CLOUDY (A) 05/06/2023 0857   LABSPEC 1.012 05/06/2023 0857   PHURINE 6.0 05/06/2023 0857   GLUCOSEU NEGATIVE 05/06/2023 0857   HGBUR LARGE (A) 05/06/2023 0857   BILIRUBINUR NEGATIVE 05/06/2023 0857   KETONESUR NEGATIVE 05/06/2023 0857   PROTEINUR 100 (A) 05/06/2023 0857   NITRITE NEGATIVE 05/06/2023 0857   LEUKOCYTESUR LARGE (A) 05/06/2023 0857   Sepsis Labs Recent Labs  Lab 04/30/23 0615 05/01/23 0435 05/02/23 0413 05/06/23 0820  WBC 7.6 7.3 6.5 7.7   Microbiology Recent Results (from the past 240 hour(s))  Blood culture (routine x 2)     Status: None   Collection Time: 04/28/23 11:30 AM   Specimen: BLOOD  Result Value Ref Range Status   Specimen Description BLOOD BLOOD LEFT FOREARM  Final   Special Requests   Final    BOTTLES DRAWN AEROBIC AND ANAEROBIC Blood Culture adequate volume    Culture   Final    NO GROWTH 5 DAYS Performed at [facility], [address] [place], [place] [address]    Report Status 05/03/2023 FINAL  Final  Blood culture (routine x 2)     Status: None   Collection Time: 04/28/23 11:30 AM   Specimen: BLOOD  Result Value Ref Range Status   Specimen Description BLOOD BLOOD RIGHT FOREARM  Final   Special Requests   Final    BOTTLES DRAWN AEROBIC AND ANAEROBIC Blood Culture results may not be optimal due to an excessive volume of blood received in culture bottles   Culture   Final    NO GROWTH 5 DAYS Performed at [facility], [address], [place], [place] [address]    Report Status 05/03/2023 FINAL  Final  SARS Coronavirus 2 by RT PCR (hospital order, performed in [facility] hospital lab) *cepheid single result test* Anterior Nasal Swab     Status: None   Collection Time: 04/28/23 12:09 PM   Specimen: Anterior Nasal Swab  Result Value Ref Range Status   SARS Coronavirus 2 by RT PCR NEGATIVE NEGATIVE Final    Comment: (NOTE) SARS-CoV-2 target nucleic acids are NOT DETECTED.  The SARS-CoV-2 RNA is generally detectable in upper and lower respiratory specimens during the acute phase of infection. The lowest concentration of SARS-CoV-2 viral  copies this assay can detect is 250 copies / mL. A negative result does not preclude SARS-CoV-2 infection and should not be used as the sole basis for treatment or other patient management decisions.  A negative result may occur with improper specimen collection / handling, submission of specimen other than nasopharyngeal swab, presence of viral mutation(s) within the areas targeted by this assay, and inadequate number of viral copies (<250 copies / mL). A negative result must be combined with clinical observations, patient history, and epidemiological information.  Fact Sheet for Patients:   RoadLapTop.co.za  Fact Sheet for Healthcare  Providers: http://kim-miller.com/  This test is not yet approved or  cleared by the [place] FDA and has been authorized for detection and/or diagnosis of SARS-CoV-2 by FDA under an Emergency Use Authorization (EUA).  This EUA will remain in effect (meaning this test can be used) for the duration of the COVID-19 declaration under Section 564(b)(1) of the Act, 21 U.S.C. section 360bbb-3(b)(1), unless the authorization is terminated or revoked sooner.  Performed at [facility], [address] [place], [place] [address]   Respiratory (~20 pathogens) panel by PCR     Status: None   Collection Time: 04/28/23  1:24 PM   Specimen: Nasopharyngeal Swab; Respiratory  Result Value Ref Range Status   Adenovirus NOT DETECTED NOT DETECTED Final   Coronavirus 229E NOT DETECTED NOT DETECTED Final    Comment: (NOTE) The Coronavirus on the Respiratory Panel, DOES NOT test for the novel  Coronavirus (2019 nCoV)    Coronavirus HKU1 NOT DETECTED NOT DETECTED Final   Coronavirus NL63 NOT DETECTED NOT DETECTED Final   Coronavirus OC43 NOT DETECTED NOT DETECTED Final   Metapneumovirus NOT DETECTED NOT DETECTED Final   Rhinovirus / Enterovirus NOT DETECTED NOT DETECTED Final   Influenza A NOT DETECTED NOT DETECTED Final   Influenza B NOT DETECTED NOT DETECTED Final   Parainfluenza Virus 1 NOT DETECTED NOT DETECTED Final   Parainfluenza Virus 2 NOT DETECTED NOT DETECTED Final   Parainfluenza Virus 3 NOT DETECTED NOT DETECTED Final   Parainfluenza Virus 4 NOT DETECTED NOT DETECTED Final   Respiratory Syncytial Virus NOT DETECTED NOT DETECTED Final   Bordetella pertussis NOT DETECTED NOT DETECTED Final   Bordetella Parapertussis NOT DETECTED NOT DETECTED Final   Chlamydophila pneumoniae NOT DETECTED NOT DETECTED Final   Mycoplasma pneumoniae NOT DETECTED NOT DETECTED Final    Comment: Performed at [facility], [address], [place], [place] [address]  MRSA  Next Gen by PCR, Nasal     Status: None   Collection Time: 04/28/23  5:07 PM   Specimen: Nasal Mucosa; Nasal Swab  Result Value Ref Range Status   MRSA by PCR Next Gen NOT DETECTED NOT DETECTED Final    Comment: (NOTE) The GeneXpert MRSA Assay (FDA approved for NASAL specimens only), is one component of a comprehensive MRSA colonization surveillance program. It is not intended to diagnose MRSA infection nor to guide or monitor treatment for MRSA infections. Test performance is not FDA approved in patients less than 62 years old. Performed at [facility], [address] [place], [place] [address]   Urine Culture     Status: None   Collection Time: 04/29/23 10:00 AM   Specimen: Urine, Random  Result Value Ref Range Status   Specimen Description   Final    URINE, RANDOM Performed at [facility], [address] [place], [place] [address]    Special Requests  Final    NONE Reflexed from [id number] Performed at [facility], [address], [place], [place] [address]    Culture   Final    NO GROWTH Performed at [facility], [address] [place], [place] [address]    Report Status 05/01/2023 FINAL  Final     Time coordinating discharge: Over 30 minutes  SIGNED:   Tresa Moore, MD  [facility] 05/06/2023, 3:15 PM Pager   If 7PM-7AM, please contact night-coverage

## 2023-05-06 NOTE — Progress Notes (Addendum)
Cardiology Progress Note   Patient Name: Ronnie Mann Date of Encounter: 05/06/2023  Primary Cardiologist: Julien Nordmann, MD  Subjective   No chest pain or sob.  Still w/ hematuria.  BPs soft throughout the day yesterday/this AM, though denies presyncope.  Hoping to go home.  Inpatient Medications    Scheduled Meds:  aspirin EC  81 mg Oral Daily   atorvastatin  40 mg Oral Daily   enoxaparin (LOVENOX) injection  40 mg Subcutaneous Q24H   sodium chloride flush  3 mL Intravenous Q12H   tamsulosin  0.4 mg Oral QPC breakfast   ticagrelor  90 mg Oral BID   Continuous Infusions:  PRN Meds: acetaminophen **OR** acetaminophen, ALPRAZolam, bisacodyl, ipratropium, melatonin, ondansetron **OR** ondansetron (ZOFRAN) IV, oxyCODONE, polyethylene glycol   Vital Signs    Vitals:   05/05/23 2105 05/05/23 2308 05/06/23 0419 05/06/23 0832  BP: 112/86 (!) 94/50 (!) 86/71 (!) 92/54  Pulse: 88 71 85 (!) 39  Resp: 18 18 18 20   Temp: 98 F (36.7 C) 98 F (36.7 C) 98 F (36.7 C) 98.9 F (37.2 C)  TempSrc:      SpO2: 100% 100% 100% 100%  Weight:      Height:        Intake/Output Summary (Last 24 hours) at 05/06/2023 1013 Last data filed at 05/06/2023 0800 Gross per 24 hour  Intake 1498.33 ml  Output 1925 ml  Net -426.67 ml   Filed Weights   04/28/23 1112  Weight: 95.3 kg    Physical Exam   GEN: Well nourished, well developed, in no acute distress.  HEENT: Grossly normal.  Neck: Supple, no JVD, carotid bruits, or masses. Cardiac: RRR, no murmurs, rubs, or gallops. No clubbing, cyanosis, edema.  Radials 2+, DP/PT 2+ and equal bilaterally.  Respiratory:  Respirations regular and unlabored, clear to auscultation bilaterally. GI: Soft, nontender, nondistended, BS + x 4. MS: no deformity or atrophy. Skin: warm and dry, no rash. Neuro:  Strength and sensation are intact. Psych: AAOx3.  Normal affect.  Labs    Chemistry Recent Labs  Lab 05/05/23 0701 05/05/23 1616  05/06/23 0820  NA 135 135 135  K 3.9 3.9 4.1  CL 100 99 102  CO2 23 25 23   GLUCOSE 199* 168* 150*  BUN 96* 97* 85*  CREATININE 1.84* 1.70* 1.55*  CALCIUM 9.1 9.1 8.9  GFRNONAA 40* 44* 49*  ANIONGAP 12 11 10      Hematology Recent Labs  Lab 05/01/23 0435 05/02/23 0413 05/06/23 0820  WBC 7.3 6.5 7.7  RBC 3.19* 2.92* 2.45*  HGB 10.3* 9.7* 8.1*  HCT 31.8* 28.8* 24.1*  MCV 99.7 98.6 98.4  MCH 32.3 33.2 33.1  MCHC 32.4 33.7 33.6  RDW 13.5 13.5 14.1  PLT 160 157 179    Cardiac Enzymes  Recent Labs  Lab 04/28/23 1130 04/28/23 1347 04/29/23 0504  TROPONINIHS 54* 234* 23,432*      BNP    Component Value Date/Time   BNP 530.9 (H) 04/28/2023 1130   HbA1c  Lab Results  Component Value Date   HGBA1C 5.5 04/28/2023    Radiology    No results found.  Telemetry    RSR, PVCs - Personally Reviewed  Cardiac Studies   2D Echocardiogram 6.9.2024   1. Left ventricular ejection fraction, by estimation, is 30 to 35%. Left  ventricular ejection fraction by PLAX is 32 %. The left ventricle has  moderately decreased function. The left ventricle demonstrates global  hypokinesis with severe  hypokinesis of  the anterior, anteroseptal and apical region. The left ventricular  internal cavity size was mildly dilated. There is mild left ventricular  hypertrophy. Left ventricular diastolic parameters are consistent with  Grade II diastolic dysfunction  (pseudonormalization).   2. Right ventricular systolic function is normal. The right ventricular  size is normal. There is normal pulmonary artery systolic pressure.   3. The mitral valve is normal in structure. Moderate mitral valve  regurgitation. No evidence of mitral stenosis.   4. The aortic valve is normal in structure. Aortic valve regurgitation is  not visualized. No aortic stenosis is present.   5. There is mild dilatation of the ascending aorta, measuring 42 mm.   6. The inferior vena cava is normal in size with <50%  respiratory  variability, suggesting right atrial pressure of 8 mmHg.  _____________   Cardiac Catheterization and Percutaneous Coronary Intervention 6.11.2024  Diagnostic Dominance: Right  Intervention   EF is 25%   1.  Severe two-vessel coronary artery disease with subtotal occlusion of the proximal LAD, diffuse moderate mid LAD disease and severe distal LAD disease.  In addition, there is 95% in the proximal right coronary artery with faint right to left collaterals to the LAD.  The left circumflex has mild nonobstructive disease and OM 3 is large and reaches the apex. 2.  Severely reduced LV systolic function with an EF of 25%.  Severely elevated left ventricular end-diastolic pressure at 34 mmHg. 3.  Successful balloon angioplasty to the proximal LAD and drug-eluting stent placement to the proximal right coronary artery.   Recommendations: The patient developed progressive pulmonary edema as the case progressed.  He was given 1 dose of IV furosemide and was started on nitroglycerin drip.  He will be started on BiPAP. I elected not to place a stent in the LAD given poor distal flow and diffuse disease overall.  In addition, placing a stent would risk closing the first diagonal. Recommend optimizing his heart failure and volume overload and consider relook angiography in the near future once he is optimized. _____________   Patient Profile     67 y.o. male with history of long history of kidney stones, presenting April 28 2023 to the emergency room with left-sided chest pain radiating to left arm, chronic shortness of breath for more than 1 month with chest congestion and found to have acute HFrEF, NSTEMI, and moderate to severe mitral regurgitation.   Assessment & Plan    1.  NSTEMI/CAD:  s/p PCI/DES to the RCA and PTCA to prox LAD.  Residual dLAD dzs w/ R  L collats w/ rec for consideration of relook angiography in the future.  No c/p or dyspnea.  Cont asa, statin, brilinta.  No ?  blocker in setting of relative hypotension.  Will see Dr. Mariah Milling on 6/25 @ 1:40.  Outpt cardiac rehab.  2.  Acute HFrEF/ICM:  EF 30-35% by echo.  Euvolemic on exam. GDMT limited by hypotension and AKI, thus no ? blocker, acei/arb/arni/mra.  Pressures remain soft. Creat trending down over past 2 days - 1.55 this AM.  No sglt2i in setting of UTI earlier this admission.  Planning early outpt follow up and hope to get him back on GDMT as tolerated.  3.  AKI:  Improving off of entresto/MRA.  Creat trending down over past 2 days - 1.55 this AM.   4.  HTN/Hypotension:  meds on hold.  Soft but stable. Asymptomatic.  5.  HL:  Lipids not checked -  will order.  Cont high potency statin rx.  6.  Nephrolithiasis/UTI: s/p abx.  Ongoing hematuria, though pt notes that it has improved. Seen by Janetta Hora w/ plan for outpt follow up.  7.  Normocytic anemia: in setting of hematuria.  8.1/24.1 this AM.  With recent ACS/PCI and residual dzs, would plan to transfuse if he drops lower.  8.  Ischemic MR:  Moderate MR by echo/LV gram.  Felt to be ischemic.  Euvolemic, asymptomatic. Plan for follow up echo follow up echo in 90 days.  Signed, Nicolasa Ducking, NP  05/06/2023, 10:13 AM    For questions or updates, please contact   Please consult [email or web address] for contact info under Cardiology/STEMI.   Cardiology attending  Patient seen and examined.  Agree with the findings as noted above by Mr. Brion Aliment, NP-C.  The patient is much improved.  He would like to go home.  He denies chest pain or shortness of breath.  He still has hematuria.  On exam he is a pleasant well-appearing 67 year old man, looks older than stated age, no acute distress.  Cardiovascular exam reveals regular rate and rhythm with a soft 2 out of 6 systolic murmur at the base.  Lungs reveal scattered rales but no increased work of breathing.  Extremities were warm with no edema.  Telemetry demonstrates sinus rhythm with occasional PVC.  Assessment and  plan   NSTEMI-he is status post PCI to the RCA and to the LAD.  From a cardiac perspective he is certainly stable for discharge at this point.  No plans for possible outpatient relook angiography in the future but this is currently not indicated at the present. Acute on chronic systolic heart failure -he appears euvolemic.  Repeat echo will be required in 3 months.  If he has persistent LV dysfunction then consideration for ICD insertion for primary prevention would be a consideration. Acute kidney injury -this has improved off Entresto. Low blood pressure -his decreased blood pressure will make it difficult for guideline directed medical therapy.  He is scheduled to see Dr. Mariah Milling in approximately 10 days.  Outpatient initiation of medical therapy will be a consideration at that time. Anemia -his hemoglobin has drifted down 2 g in the last couple of days.  I suspect a transfusion would be beneficial prior to discharge.  Lewayne Bunting, MD

## 2023-05-06 NOTE — Discharge Instructions (Signed)
Some PCP options in [place] area- not a comprehensive list  [facility]- [phone] [place]- [phone] [facility]- [phone] [facility]- [phone] [facility]- [phone] [facility]- [phone]  or [facility]erral Line [phone]  

## 2023-05-07 LAB — TYPE AND SCREEN
Antibody Screen: NEGATIVE
Unit division: 0

## 2023-05-07 LAB — BPAM RBC: Unit Type and Rh: 6200

## 2023-05-08 ENCOUNTER — Other Ambulatory Visit: Payer: Self-pay

## 2023-05-08 ENCOUNTER — Encounter: Payer: Self-pay | Admitting: Cardiovascular Disease

## 2023-05-13 ENCOUNTER — Emergency Department
Admission: EM | Admit: 2023-05-13 | Discharge: 2023-05-14 | Disposition: A | Discharge status: Home / Self Care | Payer: Medicare HMO | Source: Home / Self Care | Attending: Emergency Medicine | Admitting: Emergency Medicine

## 2023-05-13 ENCOUNTER — Encounter: Payer: Self-pay | Admitting: Cardiovascular Disease

## 2023-05-13 ENCOUNTER — Emergency Department: Payer: Medicare HMO

## 2023-05-13 ENCOUNTER — Other Ambulatory Visit: Payer: Self-pay

## 2023-05-13 DIAGNOSIS — R079 Chest pain, unspecified: Secondary | ICD-10-CM | POA: Insufficient documentation

## 2023-05-13 DIAGNOSIS — R1030 Lower abdominal pain, unspecified: Secondary | ICD-10-CM | POA: Insufficient documentation

## 2023-05-13 DIAGNOSIS — R5383 Other fatigue: Secondary | ICD-10-CM | POA: Insufficient documentation

## 2023-05-13 DIAGNOSIS — R319 Hematuria, unspecified: Secondary | ICD-10-CM | POA: Insufficient documentation

## 2023-05-13 DIAGNOSIS — N200 Calculus of kidney: Secondary | ICD-10-CM

## 2023-05-13 DIAGNOSIS — N202 Calculus of kidney with calculus of ureter: Secondary | ICD-10-CM | POA: Diagnosis not present

## 2023-05-13 DIAGNOSIS — R310 Gross hematuria: Secondary | ICD-10-CM | POA: Diagnosis not present

## 2023-05-13 LAB — COMPREHENSIVE METABOLIC PANEL
ALT: 24 U/L (ref 0–44)
AST: 27 U/L (ref 15–41)
Albumin: 3.9 g/dL (ref 3.5–5.0)
Alkaline Phosphatase: 101 U/L (ref 38–126)
Anion gap: 9 (ref 5–15)
BUN: 28 mg/dL — ABNORMAL HIGH (ref 8–23)
CO2: 19 mmol/L — ABNORMAL LOW (ref 22–32)
Calcium: 9 mg/dL (ref 8.9–10.3)
Chloride: 101 mmol/L (ref 98–111)
Creatinine, Ser: 1.1 mg/dL (ref 0.61–1.24)
GFR, Estimated: >60 mL/min (ref >60)
Glucose, Bld: 162 mg/dL — ABNORMAL HIGH (ref 70–99)
Potassium: 4.4 mmol/L (ref 3.5–5.1)
Sodium: 129 mmol/L — ABNORMAL LOW (ref 135–145)
Total Bilirubin: 0.9 mg/dL (ref 0.3–1.2)
Total Protein: 7.3 g/dL (ref 6.5–8.1)

## 2023-05-13 LAB — CBC
HCT: 26 % — ABNORMAL LOW (ref 39.0–52.0)
Hemoglobin: 8.5 g/dL — ABNORMAL LOW (ref 13.0–17.0)
MCH: 32 pg (ref 26.0–34.0)
MCHC: 32.7 g/dL (ref 30.0–36.0)
MCV: 97.7 fL (ref 80.0–100.0)
Platelets: 288 10*3/uL (ref 150–400)
RBC: 2.66 MIL/uL — ABNORMAL LOW (ref 4.22–5.81)
RDW: 14.5 % (ref 11.5–15.5)
WBC: 8.5 10*3/uL (ref 4.0–10.5)
nRBC: 0 % (ref 0.0–0.2)

## 2023-05-13 LAB — PROTIME-INR
INR: 1.1 (ref 0.8–1.2)
Prothrombin Time: 14.2 seconds (ref 11.4–15.2)

## 2023-05-13 LAB — URINALYSIS, ROUTINE W REFLEX MICROSCOPIC: Specific Gravity, Urine: 1.013 (ref 1.005–1.030)

## 2023-05-13 LAB — TYPE AND SCREEN
ABO/RH(D): A POS
Antibody Screen: NEGATIVE

## 2023-05-13 LAB — TROPONIN I (HIGH SENSITIVITY)
Troponin I (High Sensitivity): 29 ng/L — ABNORMAL HIGH (ref <18)
Troponin I (High Sensitivity): 31 ng/L — ABNORMAL HIGH (ref <18)

## 2023-05-13 LAB — LIPASE, BLOOD: Lipase: 40 U/L (ref 11–51)

## 2023-05-13 MED ORDER — KETOROLAC TROMETHAMINE 30 MG/ML IJ SOLN
30.0000 mg | Freq: Once | INTRAMUSCULAR | Status: AC
  Administered 2023-05-13: 30 mg via INTRAMUSCULAR
  Filled 2023-05-13: qty 1

## 2023-05-13 NOTE — ED Provider Notes (Signed)
[facility] Provider Note    Event Date/Time   First MD Initiated Contact with Patient 05/13/23 2149     (approximate)   History   multiple complaints   HPI  Ronnie Mann is a 67 y.o. male who presents to the emergency department today with multiple medical complaints.  Primary complaint however is for bilateral flank and suprapubic pain.  Patient states he has a long history of kidney stones.  He has recently been passing small flecks of stone.  He says that the pain is severe.  He has also noticed bloody urine.  The patient had recent admission for cardiac issues.  He did have some recent chest pain since discharge.  Additionally complaining of generalized fatigue.     Physical Exam   Triage Vital Signs: ED Triage Vitals [05/13/23 2105]  Enc Vitals Group     BP (!) 140/86     Pulse Rate (!) 101     Resp 19     Temp 98.6 F (37 C)     Temp Source Oral     SpO2 100 %     Weight 200 lb (90.7 kg)     Height 5\' 9"  (1.753 m)     Head Circumference      Peak Flow      Pain Score 4     Pain Loc      Pain Edu?      Excl. in GC?     Most recent vital signs: Vitals:   05/13/23 2105  BP: (!) 140/86  Pulse: (!) 101  Resp: 19  Temp: 98.6 F (37 C)  SpO2: 100%   General: Awake, alert, oriented. CV:  Good peripheral perfusion. Regular rate and rhythm. Resp:  Normal effort. Lungs clear. Abd:  No distention. Minimally tender in lower abdomen.   ED Results / Procedures / Treatments   Labs (all labs ordered are listed, but only abnormal results are displayed) Labs Reviewed  CBC - Abnormal; Notable for the following components:      Result Value   RBC 2.66 (*)    Hemoglobin 8.5 (*)    HCT 26.0 (*)    All other components within normal limits  COMPREHENSIVE METABOLIC PANEL - Abnormal; Notable for the following components:   Sodium 129 (*)    CO2 19 (*)    Glucose, Bld 162 (*)    BUN 28 (*)    All other components within normal limits   URINALYSIS, ROUTINE W REFLEX MICROSCOPIC - Abnormal; Notable for the following components:   Color, Urine RED (*)    APPearance CLOUDY (*)    Glucose, UA   (*)    Value: TEST NOT REPORTED DUE TO COLOR INTERFERENCE OF URINE PIGMENT   Hgb urine dipstick   (*)    Value: TEST NOT REPORTED DUE TO COLOR INTERFERENCE OF URINE PIGMENT   Bilirubin Urine   (*)    Value: TEST NOT REPORTED DUE TO COLOR INTERFERENCE OF URINE PIGMENT   Ketones, ur   (*)    Value: TEST NOT REPORTED DUE TO COLOR INTERFERENCE OF URINE PIGMENT   Protein, ur   (*)    Value: TEST NOT REPORTED DUE TO COLOR INTERFERENCE OF URINE PIGMENT   Nitrite   (*)    Value: TEST NOT REPORTED DUE TO COLOR INTERFERENCE OF URINE PIGMENT   Leukocytes,Ua   (*)    Value: TEST NOT REPORTED DUE TO COLOR INTERFERENCE OF URINE PIGMENT  All other components within normal limits  TROPONIN I (HIGH SENSITIVITY) - Abnormal; Notable for the following components:   Troponin I (High Sensitivity) 29 (*)    All other components within normal limits  PROTIME-INR  LIPASE, BLOOD  TYPE AND SCREEN  TROPONIN I (HIGH SENSITIVITY)     EKG  I, Phineas Semen, attending physician, personally viewed and interpreted this EKG  EKG Time: 2113 Rate: 94 Rhythm: sinus rhythm with PVC Axis: left axis deviation Intervals: qtc 457 QRS: RBBB, LAFB ST changes: no st elevation Impression: abnormal ekg   RADIOLOGY I independently interpreted and visualized the CXR. My interpretation: No pneumonia Radiology interpretation:  IMPRESSION:  1. Mild residual pulmonary edema, with significant improvement in  volume status since prior study.  2. Small left pleural effusion.  3. Stable hiatal hernia.    I independently interpreted and visualized the CT renal. My interpretation: Bilateral renal stones Radiology interpretation:  IMPRESSION:  1. Stable chronic severe left-sided hydronephrosis with marked left  renal cortical thinning, due to an obstructing 8 mm  proximal left  ureteral calculus. No change in position of the calculus since prior  study.  2. 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 Mucosal  thickening of the right renal pelvis is unchanged since prior exam,  likely representing chronic inflammation. Ureteroscopy may be useful  if further evaluation is desired.  3. Nonobstructing left renal calculi.  4. Cholelithiasis, with persistent wall thickening of the  gallbladder fundus most consistent with chronic cholecystitis. No  acute inflammatory changes.  5.  Aortic Atherosclerosis (ICD10-I70.0).  6. Large hiatal hernia.     PROCEDURES:  Critical Care performed: No    MEDICATIONS ORDERED IN ED: Medications  ketorolac (TORADOL) 30 MG/ML injection 30 mg (30 mg Intramuscular Given 05/13/23 2306)     IMPRESSION / MDM / ASSESSMENT AND PLAN / ED COURSE  I reviewed the triage vital signs and the nursing notes.                              Differential diagnosis includes, but is not limited to, kidney stone, UTI  Patient's presentation is most consistent with acute presentation with potential threat to life or bodily function.   Patient presented to the emergency department with multiple medical complaints.  On exam patient is afebrile.  Blood pressure is appropriate.  Did have blood in his urine.  CT renal was obtained.  This does not show any significant change from previous CTs per radiology read.  Blood work without concerning leukocytosis is evident to suggest infection.  Given recent cardiac hospitalization will check second troponin given chest pain.      FINAL CLINICAL IMPRESSION(S) / ED DIAGNOSES   Bloody Urine Flank Pain Chest pain    Note:  This document was prepared using Dragon voice recognition software and may include unintentional dictation errors.    Phineas Semen, MD 05/13/23 [phone]

## 2023-05-13 NOTE — ED Triage Notes (Signed)
Pt reports weakness with SOB. Reports intermittent tingling in arms as well. Pt recently treated for kidney stones and MI. Pt also reports hematuria and black diarrhea. Pt alert and oriented on arrival. Breathing unlabored speaking in full sentences with symmetric chest rise and fall.

## 2023-05-14 MED ORDER — OXYCODONE-ACETAMINOPHEN 5-325 MG PO TABS: 1.0000 | ORAL_TABLET | Freq: Four times a day (QID) | ORAL | 0 refills | Status: DC | PRN

## 2023-05-14 NOTE — Discharge Instructions (Signed)
Your tests today were all reassuring. Continue to take your medications and follow up with your doctor.

## 2023-05-16 ENCOUNTER — Emergency Department: Payer: Medicare HMO

## 2023-05-16 ENCOUNTER — Inpatient Hospital Stay
Admission: EM | Admit: 2023-05-16 | Discharge: 2023-05-27 | DRG: 693 | Disposition: A | Discharge status: Home / Self Care | Payer: Medicare HMO | Attending: Internal Medicine | Admitting: Internal Medicine

## 2023-05-16 ENCOUNTER — Other Ambulatory Visit: Payer: Self-pay

## 2023-05-16 ENCOUNTER — Inpatient Hospital Stay: Payer: Medicare HMO

## 2023-05-16 ENCOUNTER — Ambulatory Visit: Payer: Medicare HMO | Attending: Cardiovascular Disease | Admitting: Cardiovascular Disease

## 2023-05-16 DIAGNOSIS — T8385XA Stenosis of genitourinary prosthetic devices, implants and grafts, initial encounter: Secondary | ICD-10-CM | POA: Diagnosis not present

## 2023-05-16 DIAGNOSIS — N400 Enlarged prostate without lower urinary tract symptoms: Secondary | ICD-10-CM | POA: Diagnosis present

## 2023-05-16 DIAGNOSIS — R310 Gross hematuria: Secondary | ICD-10-CM | POA: Diagnosis not present

## 2023-05-16 DIAGNOSIS — Z7902 Long term (current) use of antithrombotics/antiplatelets: Secondary | ICD-10-CM | POA: Diagnosis not present

## 2023-05-16 DIAGNOSIS — N179 Acute kidney failure, unspecified: Secondary | ICD-10-CM | POA: Diagnosis present

## 2023-05-16 DIAGNOSIS — I2510 Atherosclerotic heart disease of native coronary artery without angina pectoris: Secondary | ICD-10-CM | POA: Insufficient documentation

## 2023-05-16 DIAGNOSIS — N200 Calculus of kidney: Secondary | ICD-10-CM | POA: Diagnosis not present

## 2023-05-16 DIAGNOSIS — Y846 Urinary catheterization as the cause of abnormal reaction of the patient, or of later complication, without mention of misadventure at the time of the procedure: Secondary | ICD-10-CM | POA: Diagnosis not present

## 2023-05-16 DIAGNOSIS — Z7982 Long term (current) use of aspirin: Secondary | ICD-10-CM

## 2023-05-16 DIAGNOSIS — K449 Diaphragmatic hernia without obstruction or gangrene: Secondary | ICD-10-CM | POA: Diagnosis present

## 2023-05-16 DIAGNOSIS — D62 Acute posthemorrhagic anemia: Secondary | ICD-10-CM | POA: Diagnosis present

## 2023-05-16 DIAGNOSIS — F410 Panic disorder [episodic paroxysmal anxiety] without agoraphobia: Secondary | ICD-10-CM | POA: Diagnosis present

## 2023-05-16 DIAGNOSIS — Z955 Presence of coronary angioplasty implant and graft: Secondary | ICD-10-CM | POA: Diagnosis not present

## 2023-05-16 DIAGNOSIS — E872 Acidosis, unspecified: Secondary | ICD-10-CM | POA: Diagnosis present

## 2023-05-16 DIAGNOSIS — J189 Pneumonia, unspecified organism: Secondary | ICD-10-CM | POA: Diagnosis present

## 2023-05-16 DIAGNOSIS — I255 Ischemic cardiomyopathy: Secondary | ICD-10-CM | POA: Diagnosis present

## 2023-05-16 DIAGNOSIS — D631 Anemia in chronic kidney disease: Secondary | ICD-10-CM | POA: Diagnosis present

## 2023-05-16 DIAGNOSIS — R399 Unspecified symptoms and signs involving the genitourinary system: Secondary | ICD-10-CM | POA: Diagnosis not present

## 2023-05-16 DIAGNOSIS — E871 Hypo-osmolality and hyponatremia: Secondary | ICD-10-CM | POA: Diagnosis present

## 2023-05-16 DIAGNOSIS — N136 Pyonephrosis: Secondary | ICD-10-CM | POA: Diagnosis present

## 2023-05-16 DIAGNOSIS — I959 Hypotension, unspecified: Secondary | ICD-10-CM | POA: Diagnosis not present

## 2023-05-16 DIAGNOSIS — R319 Hematuria, unspecified: Secondary | ICD-10-CM

## 2023-05-16 DIAGNOSIS — N1330 Unspecified hydronephrosis: Secondary | ICD-10-CM | POA: Diagnosis present

## 2023-05-16 DIAGNOSIS — R739 Hyperglycemia, unspecified: Secondary | ICD-10-CM | POA: Diagnosis present

## 2023-05-16 DIAGNOSIS — N202 Calculus of kidney with calculus of ureter: Secondary | ICD-10-CM | POA: Diagnosis present

## 2023-05-16 DIAGNOSIS — R7989 Other specified abnormal findings of blood chemistry: Secondary | ICD-10-CM | POA: Diagnosis present

## 2023-05-16 DIAGNOSIS — I5042 Chronic combined systolic (congestive) and diastolic (congestive) heart failure: Secondary | ICD-10-CM

## 2023-05-16 DIAGNOSIS — I452 Bifascicular block: Secondary | ICD-10-CM | POA: Diagnosis present

## 2023-05-16 DIAGNOSIS — I429 Cardiomyopathy, unspecified: Secondary | ICD-10-CM | POA: Diagnosis not present

## 2023-05-16 DIAGNOSIS — I214 Non-ST elevation (NSTEMI) myocardial infarction: Secondary | ICD-10-CM | POA: Diagnosis present

## 2023-05-16 DIAGNOSIS — D649 Anemia, unspecified: Secondary | ICD-10-CM | POA: Diagnosis not present

## 2023-05-16 DIAGNOSIS — I5022 Chronic systolic (congestive) heart failure: Secondary | ICD-10-CM | POA: Diagnosis present

## 2023-05-16 DIAGNOSIS — Z79899 Other long term (current) drug therapy: Secondary | ICD-10-CM

## 2023-05-16 DIAGNOSIS — N261 Atrophy of kidney (terminal): Secondary | ICD-10-CM | POA: Diagnosis present

## 2023-05-16 HISTORY — DX: Diaphragmatic hernia without obstruction or gangrene: K44.9

## 2023-05-16 LAB — BPAM RBC
Blood Product Expiration Date: 202407232359
Unit Type and Rh: 6200

## 2023-05-16 LAB — BASIC METABOLIC PANEL
Anion gap: 11 (ref 5–15)
BUN: 34 mg/dL — ABNORMAL HIGH (ref 8–23)
CO2: 22 mmol/L (ref 22–32)
Calcium: 9.5 mg/dL (ref 8.9–10.3)
Chloride: 103 mmol/L (ref 98–111)
Creatinine, Ser: 1.19 mg/dL (ref 0.61–1.24)
GFR, Estimated: >60 mL/min (ref >60)
Glucose, Bld: 232 mg/dL — ABNORMAL HIGH (ref 70–99)
Potassium: 3.9 mmol/L (ref 3.5–5.1)
Sodium: 136 mmol/L (ref 135–145)

## 2023-05-16 LAB — CBC WITH DIFFERENTIAL/PLATELET
Abs Immature Granulocytes: 0.01 10*3/uL (ref 0.00–0.07)
Basophils Absolute: 0 10*3/uL (ref 0.0–0.1)
Basophils Relative: 0 %
Eosinophils Absolute: 0.1 10*3/uL (ref 0.0–0.5)
Eosinophils Relative: 1 %
HCT: 22.9 % — ABNORMAL LOW (ref 39.0–52.0)
Hemoglobin: 7.5 g/dL — ABNORMAL LOW (ref 13.0–17.0)
Immature Granulocytes: 0 %
Lymphocytes Relative: 11 %
Lymphs Abs: 0.7 10*3/uL (ref 0.7–4.0)
MCH: 32.1 pg (ref 26.0–34.0)
MCHC: 32.8 g/dL (ref 30.0–36.0)
MCV: 97.9 fL (ref 80.0–100.0)
Monocytes Absolute: 0.4 10*3/uL (ref 0.1–1.0)
Monocytes Relative: 6 %
Neutro Abs: 5.5 10*3/uL (ref 1.7–7.7)
Neutrophils Relative %: 82 %
Platelets: 272 10*3/uL (ref 150–400)
RBC: 2.34 MIL/uL — ABNORMAL LOW (ref 4.22–5.81)
RDW: 14.1 % (ref 11.5–15.5)
WBC: 6.8 10*3/uL (ref 4.0–10.5)
nRBC: 0 % (ref 0.0–0.2)

## 2023-05-16 LAB — URINALYSIS, ROUTINE W REFLEX MICROSCOPIC
RBC / HPF: >50 RBC/hpf (ref 0–5)
Squamous Epithelial / HPF: NONE SEEN /HPF (ref 0–5)
WBC, UA: >50 WBC/hpf (ref 0–5)

## 2023-05-16 LAB — PREPARE RBC (CROSSMATCH)

## 2023-05-16 LAB — HEPATIC FUNCTION PANEL
ALT: 22 U/L (ref 0–44)
AST: 35 U/L (ref 15–41)
Albumin: 3.8 g/dL (ref 3.5–5.0)
Alkaline Phosphatase: 91 U/L (ref 38–126)
Bilirubin, Direct: 0.1 mg/dL (ref 0.0–0.2)
Indirect Bilirubin: 0.6 mg/dL (ref 0.3–0.9)
Total Bilirubin: 0.7 mg/dL (ref 0.3–1.2)
Total Protein: 7 g/dL (ref 6.5–8.1)

## 2023-05-16 LAB — HEMOGLOBIN: Hemoglobin: 8.3 g/dL — ABNORMAL LOW (ref 13.0–17.0)

## 2023-05-16 LAB — BRAIN NATRIURETIC PEPTIDE: B Natriuretic Peptide: 245.5 pg/mL — ABNORMAL HIGH (ref 0.0–100.0)

## 2023-05-16 LAB — TYPE AND SCREEN

## 2023-05-16 LAB — TROPONIN I (HIGH SENSITIVITY): Troponin I (High Sensitivity): 65 ng/L — ABNORMAL HIGH (ref <18)

## 2023-05-16 MED ORDER — ALPRAZOLAM 0.5 MG PO TABS
0.5000 mg | ORAL_TABLET | Freq: Three times a day (TID) | ORAL | Status: DC | PRN
  Administered 2023-05-16 – 2023-05-27 (×12): 0.5 mg via ORAL
  Filled 2023-05-16 (×13): qty 1

## 2023-05-16 MED ORDER — MELATONIN 5 MG PO TABS
10.0000 mg | ORAL_TABLET | Freq: Every day | ORAL | Status: DC
  Administered 2023-05-16 – 2023-05-26 (×11): 10 mg via ORAL
  Filled 2023-05-16 (×11): qty 2

## 2023-05-16 MED ORDER — ATORVASTATIN CALCIUM 20 MG PO TABS
40.0000 mg | ORAL_TABLET | Freq: Every day | ORAL | Status: DC
  Administered 2023-05-16 – 2023-05-27 (×12): 40 mg via ORAL
  Filled 2023-05-16 (×12): qty 2

## 2023-05-16 MED ORDER — SODIUM CHLORIDE 0.9 % IV SOLN
1.0000 g | Freq: Once | INTRAVENOUS | Status: AC
  Administered 2023-05-16: 1 g via INTRAVENOUS
  Filled 2023-05-16: qty 10

## 2023-05-16 MED ORDER — SODIUM CHLORIDE 0.9 % IV SOLN: Freq: Once | INTRAVENOUS | Status: AC

## 2023-05-16 MED ORDER — MORPHINE SULFATE (PF) 4 MG/ML IV SOLN
4.0000 mg | Freq: Once | INTRAVENOUS | Status: AC
  Administered 2023-05-16: 4 mg via INTRAVENOUS
  Filled 2023-05-16: qty 1

## 2023-05-16 MED ORDER — OXYCODONE-ACETAMINOPHEN 5-325 MG PO TABS
1.0000 | ORAL_TABLET | Freq: Four times a day (QID) | ORAL | Status: DC | PRN
  Administered 2023-05-16 – 2023-05-21 (×13): 1 via ORAL
  Filled 2023-05-16 (×13): qty 1

## 2023-05-16 MED ORDER — TAMSULOSIN HCL 0.4 MG PO CAPS
0.4000 mg | ORAL_CAPSULE | Freq: Every day | ORAL | Status: DC
  Administered 2023-05-17 – 2023-05-27 (×11): 0.4 mg via ORAL
  Filled 2023-05-16 (×11): qty 1

## 2023-05-16 MED ORDER — POLYETHYLENE GLYCOL 3350 17 G PO PACK
17.0000 g | PACK | Freq: Every day | ORAL | Status: DC | PRN
  Administered 2023-05-17 – 2023-05-25 (×4): 17 g via ORAL
  Filled 2023-05-16 (×5): qty 1

## 2023-05-16 MED ORDER — ACETAMINOPHEN 325 MG PO TABS
650.0000 mg | ORAL_TABLET | Freq: Four times a day (QID) | ORAL | Status: DC | PRN
  Administered 2023-05-16 – 2023-05-25 (×2): 650 mg via ORAL
  Filled 2023-05-16 (×2): qty 2

## 2023-05-16 MED ORDER — ONDANSETRON HCL 4 MG PO TABS: 4.0000 mg | ORAL_TABLET | Freq: Four times a day (QID) | ORAL | Status: DC | PRN

## 2023-05-16 MED ORDER — ACETAMINOPHEN 650 MG RE SUPP: 650.0000 mg | Freq: Four times a day (QID) | RECTAL | Status: DC | PRN

## 2023-05-16 MED ORDER — HYDROMORPHONE HCL 1 MG/ML IJ SOLN
1.0000 mg | INTRAMUSCULAR | Status: DC | PRN
  Administered 2023-05-16 – 2023-05-21 (×15): 1 mg via INTRAVENOUS
  Filled 2023-05-16 (×16): qty 1

## 2023-05-16 MED ORDER — ONDANSETRON HCL 4 MG/2ML IJ SOLN: 4.0000 mg | Freq: Four times a day (QID) | INTRAMUSCULAR | Status: DC | PRN

## 2023-05-16 NOTE — Assessment & Plan Note (Signed)
Patient has a chronic stable left renal hydronephrosis with marked renal cortical thinning due to an obstructing proximal left ureteral calculus. -Urology was consulted

## 2023-05-16 NOTE — Assessment & Plan Note (Signed)
Recent diagnosis of acute systolic heart failure with EF of 30 to 35% with NSTEMI.  S/p cardiac cath and PCI to LAD and RCA. -Cardiology consult for aspirin and Brilinta with gross hematuria. -Continue statin and carvedilol

## 2023-05-16 NOTE — Assessment & Plan Note (Addendum)
Patient with history of recent NSTEMI, s/p cardiac catheterization and PCI to proximal LAD and RCA on 05/01/2023. Slowly rising troponin 29>>65 -Cardiology was consulted and they switched Brilinta with Plavix, would like to continue DAPT due to recent stent placement unless there is a life-threatening bleeding. -Keep hemoglobin above 8

## 2023-05-16 NOTE — H&P (Addendum)
History and Physical    Patient: Ronnie Mann IEP:[id number] DOB: 01-15-1956 DOA: 05/16/2023 DOS: the patient was seen and examined on 05/16/2023 PCP: Pcp, No  Patient coming from: Home  Chief Complaint:  Chief Complaint  Patient presents with   Shortness of Breath   HPI: Ronnie Mann is a 67 y.o. male with medical history significant of recent NSTEMI, s/p PCI to proximal LAD and RCA on 05/01/23, HFrEF(EF 30 to 35%), and nephrolithiasis presented to ED with shortness of breath, bilateral flank pain radiating towards lower abdomen, more on right than left, dysuria and gross hematuria.  Per patient he is having hematuria since his most recent admission on 6/7 till 6/15 when he was diagnosed with NSTEMI and was placed on DAPT with aspirin and Brilinta. He developed worsening bilateral flank pain for the past 2 days, radiating to the lower abdomen, associated dysuria and worsening hematuria.  He was also experiencing increased urinary frequency 2 days ago, having some difficulty micturition today.  Having some chills but no fever.  Patient denies any chest pain but having exertional dyspnea, orthopnea and PND.  No lower extremity edema.  His appetite remained poor since most recent NSTEMI.  Seems like having some panic attacks regarding his recent heart attack.  Patient was found to have bilateral nephrolithiasis with a right staghorn stone and a left obstructing stone with hydronephrosis on recent CT scan done on 05/13/2023.  He was advised to follow-up with urology but has not seen them yet.  ED course.  Vital stable.  Labs pertinent for hemoglobin of 7.5, gradually decreasing from 12,  2 weeks ago.  Blood glucose of 232, troponin 29>>65.  UA with gross hematuria and many bacteria.Urine cultures were sent.  2 Units of PRBC's ordered.  Urology and Cardiology was consulted. Holding tonight's dose of Brilinta, cardiology might switch to Plavix from tomorrow.  They would like to continue with DAPT  with a 54-week old stent, unless life-threatening bleeding.  Patient was also given a dose of ceftriaxone in ED for concern of UTI.  Review of Systems: As mentioned in the history of present illness. All other systems reviewed and are negative. Past Medical History:  Diagnosis Date   Kidney stones    Past Surgical History:  Procedure Laterality Date   CORONARY STENT INTERVENTION N/A 05/01/2023   Procedure: CORONARY STENT INTERVENTION;  Surgeon: Iran Ouch, MD;  Location: [facility] INVASIVE CV LAB;  Service: Cardiovascular;  Laterality: N/A;   LEFT HEART CATH AND CORONARY ANGIOGRAPHY N/A 05/01/2023   Procedure: LEFT HEART CATH AND CORONARY ANGIOGRAPHY;  Surgeon: Iran Ouch, MD;  Location: [facility] INVASIVE CV LAB;  Service: Cardiovascular;  Laterality: N/A;   Social History:  reports that he has never smoked. He has never used smokeless tobacco. He reports that he does not drink alcohol and does not use drugs.  No Known Allergies  History reviewed. No pertinent family history.  Prior to Admission medications   Medication Sig Start Date End Date Taking? Authorizing Provider  ALPRAZolam Prudy Feeler) 0.5 MG tablet Take 1 tablet (0.5 mg total) by mouth 3 (three) times daily as needed for anxiety. 05/06/23   Tresa Moore, MD  aspirin EC 81 MG tablet Take 1 tablet (81 mg total) by mouth daily. Swallow whole. 05/07/23   Tresa Moore, MD  atorvastatin (LIPITOR) 40 MG tablet Take 1 tablet (40 mg total) by mouth daily. 05/06/23 06/05/23  Tresa Moore, MD  oxyCODONE-acetaminophen (PERCOCET) 5-325 MG tablet Take 1  tablet by mouth every 6 (six) hours as needed for up to 3 days for severe pain. 05/14/23 05/17/23  Sharman Cheek, MD  tamsulosin [facility]) 0.4 MG CAPS capsule Take 1 capsule (0.4 mg total) by mouth daily after breakfast. 05/07/23 06/06/23  Tresa Moore, MD  ticagrelor (BRILINTA) 90 MG TABS tablet Take 1 tablet (90 mg total) by mouth 2 (two) times daily. 05/06/23 06/05/23   Tresa Moore, MD    Physical Exam: Vitals:   05/16/23 1330 05/16/23 1430 05/16/23 1500 05/16/23 1600  BP: 95/76 129/85 (!) 133/55   Pulse: 88 85 87 90  Resp: 20 18 20 18   Temp:    98.2 F (36.8 C)  TempSrc:    Oral  SpO2: 100% 99% 100% 98%    General: Vital signs reviewed.  Patient is well-developed and well-nourished, in no acute distress and cooperative with exam.  Head: Normocephalic and atraumatic. Eyes: EOMI, conjunctivae normal, no scleral icterus.  Neck: Supple, trachea midline, normal ROM, no JVD, masses, thyromegaly, or carotid bruit present.  Cardiovascular: RRR, S1 normal, S2 normal, no murmurs, gallops, or rubs. Pulmonary/Chest: Clear to auscultation bilaterally, no wheezes, rales, or rhonchi. Abdominal: Soft, non-tender, non-distended, BS +, Extremities: No lower extremity edema bilaterally,  pulses symmetric and intact bilaterally. No cyanosis or clubbing. Neurological: A&O x3, Strength is normal and symmetric bilaterally, cranial nerve II-XII are grossly intact, no focal motor deficit, sensory intact to light touch bilaterally.  Skin: Warm, dry and intact. No rashes or erythema. Psychiatric: Normal mood and affect.   Data Reviewed: Prior data reviewed as mentioned above  Assessment and Plan: * Gross hematuria Symptomatic anemia. Patient with history of chronic nephrolithiasis and most recent CT abdomen showing staghorn and obstructing stones.  Symptoms are  consistent with renal colic.  Recently placed on DAPT with aspirin and Brilinta.  Hemoglobin at 7.5. -Urology consult -Holding Brilinta for tonight, already took aspirin this morning -2 unit of PRBC-keep hemoglobin above 8 with recent NSTEMI. -Renal ultrasound -Pain management  Staghorn calculus Patient was found to have right-sided staghorn calculus and left-sided hydronephrosis with a obstructing stone on most recent CT renal stone study done on 05/13/2023.  Might be contributory to gross  hematuria. -Urology consult  Hydronephrosis of left kidney Patient has a chronic stable left renal hydronephrosis with marked renal cortical thinning due to an obstructing proximal left ureteral calculus. -Urology was consulted  Elevated troponin Patient with history of recent NSTEMI, s/p cardiac catheterization and PCI to proximal LAD and RCA on 05/01/2023. Slowly rising troponin 29>>65 -Cardiology consult as patient need to be on DAPT due to recent PCI and now presenting with gross hematuria  Ischemic cardiomyopathy Recent diagnosis of acute systolic heart failure with EF of 30 to 35% with NSTEMI.  S/p cardiac cath and PCI to LAD and RCA. -Cardiology consult for aspirin and Brilinta with gross hematuria. -Continue statin and carvedilol  Acute hyperglycemia Patient was found to have blood glucose above 200, also noted to have elevated blood glucose level during most recent admissions and A1c was checked and it was 5.5. Patient was placed on SGL 2 inhibitor on discharge. -Continue to monitor  UTI symptoms UA with bacteriuria.  Patient has bilateral renal colic.  Recent UA was also positive for pyuria and bacteriuria but urine cultures were sterile. No leukocytosis.  Received a dose of ceftriaxone in ED and urine cultures were sent. -Holding further antibiotics -Follow-up urine cultures   Advance Care Planning:   Code Status: Full Code  Consults: Urology.  Cardiology  Family Communication: Discussed with patient  Severity of Illness: The appropriate patient status for this patient is INPATIENT. Inpatient status is judged to be reasonable and necessary in order to provide the required intensity of service to ensure the patient's safety. The patient's presenting symptoms, physical exam findings, and initial radiographic and laboratory data in the context of their chronic comorbidities is felt to place them at high risk for further clinical deterioration. Furthermore, it is not  anticipated that the patient will be medically stable for discharge from the hospital within 2 midnights of admission.   * I certify that at the point of admission it is my clinical judgment that the patient will require inpatient hospital care spanning beyond 2 midnights from the point of admission due to high intensity of service, high risk for further deterioration and high frequency of surveillance required.*  This record has been created using Conservation officer, historic buildings. Errors have been sought and corrected,but may not always be located. Such creation errors do not reflect on the standard of care.   Author: Arnetha Courser, MD 05/16/2023 5:57 PM  For on call review [email or web address].

## 2023-05-16 NOTE — Assessment & Plan Note (Addendum)
Patient was found to have right-sided staghorn calculus and left-sided hydronephrosis with a obstructing stone on most recent CT renal stone study done on 05/13/2023.  Might be contributory to gross hematuria. -Urology consult

## 2023-05-16 NOTE — ED Triage Notes (Signed)
Pt presents to ED with /co of SOB, pt states "I'm back again". Pt states he checked in because his son is here. Pt has multiple complaints at this time. Pt mentions "kidney stone pain". Pt endorses L arm pain as well.

## 2023-05-16 NOTE — ED Provider Notes (Signed)
-----------------------------------------   4:48 PM on 05/16/2023 ----------------------------------------- Patient care assumed from Dr. Sidney Ace.  Patient's labs have resulted showing significant hematuria as well as greater than 50 white blood cells and many bacteria likely indicating acute urinary tract infection.  Nurse reports gross hematuria on urine sample.  Patient's hemoglobin is down to 7.5 which is down trended from greater than 12 2 weeks ago.  Rectal exam was negative per Dr. Sidney Ace.  Patient continues to complain of right flank pain.  2 weeks ago patient was admitted to the hospital for an NSTEMI, at that time was also complaining of right flank pain found to have staghorn calculus.  Given the patient's continued hematuria with downtrending hemoglobin and signs of infection as well as slightly elevated troponin compared to the last check we will admit to the hospital service for further workup and treatment.   Minna Antis, MD 05/16/23 1651

## 2023-05-16 NOTE — Assessment & Plan Note (Signed)
Patient was found to have blood glucose above 200, also noted to have elevated blood glucose level during most recent admissions and A1c was checked and it was 5.5. Patient was placed on SGL 2 inhibitor on discharge. -Continue to monitor

## 2023-05-16 NOTE — ED Provider Notes (Signed)
[facility] Provider Note    Event Date/Time   First MD Initiated Contact with Patient 05/16/23 1323     (approximate)   History   Shortness of Breath   HPI  Ronnie Mann is a 67 y.o. male with past medical history of kidney stones requiring lithotripsy, heart failure with reduced ejection fraction, coronary disease status post recent NSTEMI who presents because of flank pain.  Patient tells me that he has been having bilateral flank pain since leaving the hospital.  Today he was going to cardiology appointment when he had a "spell".  He started having some chest pressure and pain on the left arm which is similar to how he felt when he had a heart attack.  He is also having ongoing hematuria and is passing what he thinks are stones.  Denies fevers or chills.  Had 1 episode of blood in his stool several days ago when he left the hospital but none recently.  On aspirin and Brilinta.     Past Medical History:  Diagnosis Date   Kidney stones     Patient Active Problem List   Diagnosis Date Noted   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 heart with stable angina pectoris (HCC) 05/04/2023   Ischemic cardiomyopathy 05/03/2023   Acute hyperglycemia 05/02/2023   Acute systolic heart failure (HCC) 05/01/2023   Non-ST elevation (NSTEMI) myocardial infarction (HCC) 04/29/2023   CHF (congestive heart failure) (HCC) 04/28/2023   Elevated troponin 04/28/2023   Substernal chest pain 04/28/2023   Staghorn calculus 04/28/2023   Lactic acidosis 04/28/2023   AKI (acute kidney injury) (HCC) 04/28/2023   Hydronephrosis of left kidney 04/28/2023   Wheezing 04/28/2023   Uncontrolled type 2 diabetes mellitus with hyperglycemia, without long-term current use of insulin (HCC) 04/28/2023   Essential hypertension 04/28/2023   Hiatal hernia 04/28/2023   Cholelithiasis 04/28/2023   Shortness of breath 04/28/2023   Viral URI 04/28/2023     Physical Exam  Triage  Vital Signs: ED Triage Vitals [05/16/23 1147]  Enc Vitals Group     BP (!) 146/76     Pulse Rate 98     Resp (!) 22     Temp 98.5 F (36.9 C)     Temp Source Oral     SpO2 100 %     Weight      Height      Head Circumference      Peak Flow      Pain Score 10     Pain Loc      Pain Edu?      Excl. in GC?     Most recent vital signs: Vitals:   05/16/23 1430 05/16/23 1500  BP: 129/85 (!) 133/55  Pulse: 85 87  Resp: 18 20  Temp:    SpO2: 99% 100%     General: Awake, no distress.  CV:  Good peripheral perfusion.  Resp:  Normal effort.  Abd:  No distention.  Abdomen is soft nontender throughout Neuro:             Awake, Alert, Oriented x 3  Other:  Brown stool on rectal exam   ED Results / Procedures / Treatments  Labs (all labs ordered are listed, but only abnormal results are displayed) Labs Reviewed  URINALYSIS, ROUTINE W REFLEX MICROSCOPIC - Abnormal; Notable for the following components:      Result Value   Color, Urine RED (*)    APPearance TURBID (*)  Glucose, UA   (*)    Value: TEST NOT REPORTED DUE TO COLOR INTERFERENCE OF URINE PIGMENT   Hgb urine dipstick   (*)    Value: TEST NOT REPORTED DUE TO COLOR INTERFERENCE OF URINE PIGMENT   Bilirubin Urine   (*)    Value: TEST NOT REPORTED DUE TO COLOR INTERFERENCE OF URINE PIGMENT   Ketones, ur   (*)    Value: TEST NOT REPORTED DUE TO COLOR INTERFERENCE OF URINE PIGMENT   Protein, ur   (*)    Value: TEST NOT REPORTED DUE TO COLOR INTERFERENCE OF URINE PIGMENT   Nitrite   (*)    Value: TEST NOT REPORTED DUE TO COLOR INTERFERENCE OF URINE PIGMENT   Leukocytes,Ua   (*)    Value: TEST NOT REPORTED DUE TO COLOR INTERFERENCE OF URINE PIGMENT   Bacteria, UA MANY (*)    All other components within normal limits  BASIC METABOLIC PANEL - Abnormal; Notable for the following components:   Glucose, Bld 232 (*)    BUN 34 (*)    All other components within normal limits  CBC WITH DIFFERENTIAL/PLATELET - Abnormal;  Notable for the following components:   RBC 2.34 (*)    Hemoglobin 7.5 (*)    HCT 22.9 (*)    All other components within normal limits  TROPONIN I (HIGH SENSITIVITY) - Abnormal; Notable for the following components:   Troponin I (High Sensitivity) 65 (*)    All other components within normal limits  HEPATIC FUNCTION PANEL  TYPE AND SCREEN     EKG  I reviewed interpreted patient's EKG which shows sinus rhythm with a right bundle branch block no acute ischemic changes   RADIOLOGY I reviewed and interpreted the CXR which does not show any acute cardiopulmonary process   PROCEDURES:  Critical Care performed: Yes, see critical care procedure note(s)  Procedures  The patient is on the cardiac monitor to evaluate for evidence of arrhythmia and/or significant heart rate changes.   MEDICATIONS ORDERED IN ED: Medications  morphine (PF) 4 MG/ML injection 4 mg (4 mg Intravenous Given 05/16/23 1512)     IMPRESSION / MDM / ASSESSMENT AND PLAN / ED COURSE  I reviewed the triage vital signs and the nursing notes.                              Patient's presentation is most consistent with acute presentation with potential threat to life or bodily function.  Differential diagnosis includes, but is not limited to, symptomatic anemia, CHF exacerbation, ACS/NSTEMI  Patient is a 67 year old male with multiple medical comorbidities including CHF with EF of 30 to 35%, recent NSTEMI and known ureteral stones he presents today because of shortness of breath chest pain and ongoing flank pain and hematuria.  Patient was recently mated for NSTEMI.  Did receive a unit of blood during that admission.  He has had ongoing flank pain and hematuria since discharge but no fevers.  Today he was at an outpatient cardiology appointment was not actually seen but developed chest pain and dyspnea so was transported to the ED.  Patient's vital signs are reassuring.  On exam he does look pale but nontoxic.  No  peripheral edema abdominal exam is benign.  Patient's EKG is nonischemic with a right bundle branch block.  Troponin is mildly elevated at 65.  Notably patient's hemoglobin has down trended significantly over the next and is now 7.5.  Was 8.5 just 3 days ago when patient presented to the ED with ongoing flank pain.  I am concerned that patient's dyspnea and chest pain could be result of his symptomatic anemia and with his comorbidities he is bordering needing a blood transfusion.  Since you are does have greater than 50 red cells and white cells.  Will send for culture but will defer antibiotics at this time as last culture did not grow any bacteria.  Will admit for symptomatic anemia.       FINAL CLINICAL IMPRESSION(S) / ED DIAGNOSES   Final diagnoses:  Symptomatic anemia     Rx / DC Orders   ED Discharge Orders     None        Note:  This document was prepared using Dragon voice recognition software and may include unintentional dictation errors.   Georga Hacking, MD 05/16/23 [phone]

## 2023-05-16 NOTE — Assessment & Plan Note (Addendum)
Symptomatic anemia. Patient with history of chronic nephrolithiasis and most recent CT abdomen showing staghorn and obstructing stones.  Symptoms are  consistent with renal colic.  Recently placed on DAPT with aspirin and Brilinta.  Hemoglobin at 7.5. -Urology consult -Holding Brilinta for tonight, already took aspirin this morning -2 unit of PRBC-keep hemoglobin above 8 with recent NSTEMI. -Renal ultrasound -Pain management

## 2023-05-16 NOTE — Hospital Course (Signed)
Ronnie Mann is a 66 y.o. male with medical history significant of recent NSTEMI, s/p PCI to proximal LAD and RCA on 05/01/23, HFrEF(EF 30 to 35%), and nephrolithiasis presented to ED with shortness of breath, bilateral flank pain radiating towards lower abdomen, more on right than left, dysuria and gross hematuria.  Patient with history of bilateral nephrolithiasis, symptoms consistent with symptomatic anemia and renal colic.  Hemoglobin 7.5.  Urology was consulted and 2 unit of PRBC ordered

## 2023-05-16 NOTE — Assessment & Plan Note (Addendum)
UA with bacteriuria.  Patient has bilateral renal colic.  Recent UA was also positive for pyuria and bacteriuria but urine cultures were sterile. No leukocytosis.  Received a dose of ceftriaxone in ED and urine cultures were sent.  Repeat good urine cultures with no growth -No need for antibiotics

## 2023-05-17 ENCOUNTER — Encounter: Payer: Self-pay | Admitting: Internal Medicine

## 2023-05-17 ENCOUNTER — Encounter: Payer: Self-pay | Admitting: Cardiovascular Disease

## 2023-05-17 DIAGNOSIS — N200 Calculus of kidney: Secondary | ICD-10-CM | POA: Diagnosis not present

## 2023-05-17 DIAGNOSIS — R310 Gross hematuria: Secondary | ICD-10-CM | POA: Diagnosis not present

## 2023-05-17 DIAGNOSIS — I5022 Chronic systolic (congestive) heart failure: Secondary | ICD-10-CM

## 2023-05-17 DIAGNOSIS — D649 Anemia, unspecified: Secondary | ICD-10-CM | POA: Diagnosis not present

## 2023-05-17 DIAGNOSIS — I5042 Chronic combined systolic (congestive) and diastolic (congestive) heart failure: Secondary | ICD-10-CM

## 2023-05-17 DIAGNOSIS — N1330 Unspecified hydronephrosis: Secondary | ICD-10-CM | POA: Diagnosis not present

## 2023-05-17 LAB — URINE CULTURE: Culture: NO GROWTH

## 2023-05-17 LAB — BASIC METABOLIC PANEL
Anion gap: 9 (ref 5–15)
BUN: 35 mg/dL — ABNORMAL HIGH (ref 8–23)
CO2: 23 mmol/L (ref 22–32)
Calcium: 8.9 mg/dL (ref 8.9–10.3)
Chloride: 102 mmol/L (ref 98–111)
Creatinine, Ser: 1.05 mg/dL (ref 0.61–1.24)
GFR, Estimated: >60 mL/min (ref >60)
Glucose, Bld: 126 mg/dL — ABNORMAL HIGH (ref 70–99)
Potassium: 4.2 mmol/L (ref 3.5–5.1)
Sodium: 134 mmol/L — ABNORMAL LOW (ref 135–145)

## 2023-05-17 LAB — CBC
HCT: 24.6 % — ABNORMAL LOW (ref 39.0–52.0)
Hemoglobin: 7.9 g/dL — ABNORMAL LOW (ref 13.0–17.0)
MCH: 31.1 pg (ref 26.0–34.0)
MCHC: 32.1 g/dL (ref 30.0–36.0)
MCV: 96.9 fL (ref 80.0–100.0)
Platelets: 245 10*3/uL (ref 150–400)
RBC: 2.54 MIL/uL — ABNORMAL LOW (ref 4.22–5.81)
RDW: 15.1 % (ref 11.5–15.5)
WBC: 6.5 10*3/uL (ref 4.0–10.5)
nRBC: 0 % (ref 0.0–0.2)

## 2023-05-17 LAB — PREPARE RBC (CROSSMATCH)

## 2023-05-17 LAB — BPAM RBC: ISSUE DATE / TIME: 202406261149

## 2023-05-17 LAB — HEMOGLOBIN AND HEMATOCRIT, BLOOD
HCT: 27.6 % — ABNORMAL LOW (ref 39.0–52.0)
Hemoglobin: 9.1 g/dL — ABNORMAL LOW (ref 13.0–17.0)

## 2023-05-17 LAB — TYPE AND SCREEN: Unit division: 0

## 2023-05-17 LAB — GLUCOSE, CAPILLARY: Glucose-Capillary: 137 mg/dL — ABNORMAL HIGH (ref 70–99)

## 2023-05-17 MED ORDER — CLOPIDOGREL BISULFATE 75 MG PO TABS
300.0000 mg | ORAL_TABLET | Freq: Every day | ORAL | Status: DC
  Administered 2023-05-17: 300 mg via ORAL
  Filled 2023-05-17: qty 4

## 2023-05-17 MED ORDER — SODIUM CHLORIDE 0.9% IV SOLUTION: Freq: Once | INTRAVENOUS | Status: AC

## 2023-05-17 MED ORDER — ASPIRIN 81 MG PO TBEC
81.0000 mg | DELAYED_RELEASE_TABLET | Freq: Every day | ORAL | Status: DC
  Administered 2023-05-17 – 2023-05-22 (×6): 81 mg via ORAL
  Filled 2023-05-17 (×6): qty 1

## 2023-05-17 MED ORDER — FE FUM-VIT C-VIT B12-FA 460-60-0.01-1 MG PO CAPS
1.0000 | ORAL_CAPSULE | Freq: Two times a day (BID) | ORAL | Status: DC
  Administered 2023-05-17 – 2023-05-27 (×20): 1 via ORAL
  Filled 2023-05-17 (×20): qty 1

## 2023-05-17 MED ORDER — METOPROLOL SUCCINATE ER 25 MG PO TB24
12.5000 mg | ORAL_TABLET | Freq: Every day | ORAL | Status: DC
  Administered 2023-05-17 – 2023-05-27 (×10): 12.5 mg via ORAL
  Filled 2023-05-17 (×11): qty 1

## 2023-05-17 MED ORDER — CLOPIDOGREL BISULFATE 75 MG PO TABS
75.0000 mg | ORAL_TABLET | Freq: Every day | ORAL | Status: DC
  Administered 2023-05-18 – 2023-05-27 (×10): 75 mg via ORAL
  Filled 2023-05-17 (×10): qty 1

## 2023-05-17 NOTE — Progress Notes (Signed)
Progress Note   Patient: Ronnie Mann:[id number] DOB: 11-Oct-1956 DOA: 05/16/2023     1 DOS: the patient was seen and examined on 05/17/2023   Brief hospital course: Ronnie Mann is a 66 y.o. male with medical history significant of recent NSTEMI, s/p PCI to proximal LAD and RCA on 05/01/23, HFrEF(EF 30 to 35%), and nephrolithiasis presented to ED with shortness of breath, bilateral flank pain radiating towards lower abdomen, more on right than left, dysuria and gross hematuria.  Patient with history of bilateral nephrolithiasis, symptoms consistent with symptomatic anemia and renal colic.  Hemoglobin 7.5.  Patient did received a dose of ceftriaxone in ED which was not continued.  Less likely UTI.  Urology was consulted and 2 unit of PRBC ordered.  6/26: Vital stable.  Continue to have gross hematuria.  Cardiology replace Brilinta with Plavix and he will continue with DAPT for now.  Apparently received just 1 unit due to blood bank protocol, hemoglobin at 7.9 this morning.  Ordered second unit.  Goal hemoglobin above 8 due to recent NSTEMI.  Also starting on iron supplement. Urology is recommending close outpatient follow-up with a possible cystoscope and ureteral stent placement which they will arrange as outpatient.   Assessment and Plan: * Gross hematuria Symptomatic anemia. Patient with history of chronic nephrolithiasis and most recent CT abdomen showing staghorn and obstructing stones.  Symptoms are  consistent with renal colic.  Recently placed on DAPT with aspirin and Brilinta.  Hemoglobin at 7.5, on admission which improved to 7.9 after getting 1 unit of PRBC.  Apparently did not receive the second unit due to blood bank protocol. -Urology consult-recommending close outpatient follow-up for possible cystoscopy and ureteral stent placement. -Ordered 1 more unit of PRBC -keep hemoglobin above 8 with recent NSTEMI. -Renal ultrasound-with persistent severe left hydronephrosis with  chronic renal cortical scarring/thinning.  Nonobstructive right staghorn calculus -Pain management  Staghorn calculus Patient was found to have right-sided staghorn calculus and left-sided hydronephrosis with a obstructing stone on most recent CT renal stone study done on 05/13/2023.  Might be contributory to gross hematuria.  Renal ultrasound confirmed. -Urology consult-recommending close outpatient follow-up.  Will be high risk for definitive treatment of this type of stones due to being on DAPT and recent NSTEMI  Hydronephrosis of left kidney Patient has a chronic stable left renal hydronephrosis with marked renal cortical thinning due to an obstructing proximal left ureteral calculus. -Urology was consulted  Elevated troponin Patient with history of recent NSTEMI, s/p cardiac catheterization and PCI to proximal LAD and RCA on 05/01/2023. Slowly rising troponin 29>>65 -Cardiology was consulted and they switched Brilinta with Plavix, would like to continue DAPT due to recent stent placement unless there is a life-threatening bleeding. -Keep hemoglobin above 8  Ischemic cardiomyopathy Recent diagnosis of acute systolic heart failure with EF of 30 to 35% with NSTEMI.  S/p cardiac cath and PCI to LAD and RCA. -Cardiology consult -switched Brilinta with Plavix. -Continue statin and carvedilol -Daily weight and BMP -Strict intake and output  Acute hyperglycemia Patient was found to have blood glucose above 200, also noted to have elevated blood glucose level during most recent admissions and A1c was checked and it was 5.5. Patient was placed on SGL 2 inhibitor on discharge. -Continue to monitor  UTI symptoms UA with bacteriuria.  Patient has bilateral renal colic.  Recent UA was also positive for pyuria and bacteriuria but urine cultures were sterile. No leukocytosis.  Received a dose of ceftriaxone in  ED and urine cultures were sent.  Repeat good urine cultures with no growth -No need for  antibiotics   Subjective: Patient was seen and examined in with urology today.  Pain seems little improved than yesterday.  Continues to have gross hematuria.  Physical Exam: Vitals:   05/17/23 1209 05/17/23 1215 05/17/23 1231 05/17/23 1420  BP: (!) 147/80  126/72 123/72  Pulse: 85  68 70  Resp:  13  18  Temp: 98 F (36.7 C)  98 F (36.7 C) (!) 97.5 F (36.4 C)  TempSrc: Oral  Oral Oral  SpO2: 98%  99% 100%  Weight:      Height:       General.  Well-developed gentleman, in no acute distress. Pulmonary.  Lungs clear bilaterally, normal respiratory effort. CV.  Regular rate and rhythm, no JVD, rub or murmur. Abdomen.  Soft, nontender, nondistended, BS positive. CNS.  Alert and oriented .  No focal neurologic deficit. Extremities.  No edema, no cyanosis, pulses intact and symmetrical. Psychiatry.  Judgment and insight appears normal.   Data Reviewed: Prior data reviewed  Family Communication: Talked with son on phone.  Disposition: Status is: Inpatient Remains inpatient appropriate because: Severity of illness  Planned Discharge Destination: Home  DVT prophylaxis.  SCDs Time spent: 50 minutes  This record has been created using Conservation officer, historic buildings. Errors have been sought and corrected,but may not always be located. Such creation errors do not reflect on the standard of care.   Author: Arnetha Courser, MD 05/17/2023 3:01 PM  For on call review [email or web address].

## 2023-05-17 NOTE — Consult Note (Signed)
Urology Consult  I have been asked to see the patient by Dr. Nelson Chimes, for evaluation and management of gross hematuria.  Chief Complaint: Bilateral flank pain, chest pressure and LUE pain, gross hematuria  History of Present Illness: Ronnie Mann is a 67 y.o. year old male with recent NSTEMI s/p PCI to proximal LAD and RCA on 05/01/2023 on aspirin and Brilinta, HFrEF, and recurrent nephrolithiasis with bilateral partial staghorn stones and chronic left renal atrophy and hydronephrosis readmitted overnight with gross hematuria and symptomatic anemia.  Admission labs notable for UA with red color, >50 RBC/hpf, >50 WBC/hpf, and many bacteria, stable compared to prior; creatinine 1.19 (baseline 1.10); WBC count 6.8; and hemoglobin 7.5, 11.6 on 04/29/2023.    He has received 1 unit PRBCs.  This morning, hemoglobin is stable to slightly increased at 7.9 and creatinine has returned to baseline at 1.05.  Urine culture pending, he received a single dose of antibiotics in the ED as below. Brilinta has been transitioned to Plavix.  CT stone study dated 05/13/2023 showed stable urolithiasis and chronic left hydronephrosis with left renal atrophy.  Only mild enlargement of the prostate.  Renal ultrasound yesterday with no evidence of urinary retention or clot retention.  Stones and hydronephrosis remained stable in appearance.  Today he reports feeling much better than on arrival with significant improvement in flank pain. He has no acute concerns today. He continues to void wine-colored urine without clots.  We consulted on him during his recent admission due to CT findings of extensive stone burden including a left upper pole partial staghorn stone, left lower pole partial staghorn stone, and 1.5 cm proximal left ureteral stone, which was felt to be chronic.  He was clinically stable and has a history of poorly tolerating stents, so we elected to manage him conservatively and defer ureteral stent placement  pending outpatient follow-up with Dr. Richardo Hanks next month to discuss definitive stone management.  Anti-infectives (From admission, onward)    Start     Dose/Rate Route Frequency Ordered Stop   05/16/23 1700  cefTRIAXone (ROCEPHIN) 1 g in sodium chloride 0.9 % 100 mL IVPB        1 g 200 mL/hr over 30 Minutes Intravenous  Once 05/16/23 1652 05/16/23 1814        Past Medical History:  Diagnosis Date   Kidney stones     Past Surgical History:  Procedure Laterality Date   CORONARY STENT INTERVENTION N/A 05/01/2023   Procedure: CORONARY STENT INTERVENTION;  Surgeon: Iran Ouch, MD;  Location: [facility] INVASIVE CV LAB;  Service: Cardiovascular;  Laterality: N/A;   LEFT HEART CATH AND CORONARY ANGIOGRAPHY N/A 05/01/2023   Procedure: LEFT HEART CATH AND CORONARY ANGIOGRAPHY;  Surgeon: Iran Ouch, MD;  Location: [facility] INVASIVE CV LAB;  Service: Cardiovascular;  Laterality: N/A;    Home Medications:  Current Meds  Medication Sig   ALPRAZolam (XANAX) 0.5 MG tablet Take 1 tablet (0.5 mg total) by mouth 3 (three) times daily as needed for anxiety.   aspirin EC 81 MG tablet Take 1 tablet (81 mg total) by mouth daily. Swallow whole.   atorvastatin (LIPITOR) 40 MG tablet Take 1 tablet (40 mg total) by mouth daily.   oxyCODONE-acetaminophen (PERCOCET) 5-325 MG tablet Take 1 tablet by mouth every 6 (six) hours as needed for up to 3 days for severe pain.   tamsulosin (FLOMAX) 0.4 MG CAPS capsule Take 1 capsule (0.4 mg total) by mouth daily after breakfast.   ticagrelor (BRILINTA)  90 MG TABS tablet Take 1 tablet (90 mg total) by mouth 2 (two) times daily.    Allergies: No Known Allergies  History reviewed. No pertinent family history.  Social History:  reports that he has never smoked. He has never used smokeless tobacco. He reports that he does not drink alcohol and does not use drugs.  ROS: A complete review of systems was performed.  All systems are negative except for pertinent  findings as noted.  Physical Exam:  Vital signs in last 24 hours: Temp:  [97.5 F (36.4 C)-98.7 F (37.1 C)] 97.5 F (36.4 C) (06/26 0403) Pulse Rate:  [72-98] 76 (06/26 0403) Resp:  [12-22] 16 (06/26 0403) BP: (95-161)/(55-86) 129/74 (06/26 0403) SpO2:  [96 %-100 %] 100 % (06/26 0403) Weight:  [89.8 kg-91 kg] 89.8 kg (06/26 0435) Constitutional:  Alert and oriented, no acute distress HEENT: [place] AT, moist mucus membranes Cardiovascular: No clubbing, cyanosis, or edema Respiratory: Normal respiratory effort Skin: No rashes, bruises or suspicious lesions Neurologic: Grossly intact, no focal deficits, moving all 4 extremities Psychiatric: Normal mood and affect  Laboratory Data:  Recent Labs    05/16/23 1156 05/16/23 2259 05/17/23 0502  WBC 6.8  --  6.5  HGB 7.5* 8.3* 7.9*  HCT 22.9*  --  24.6*   Recent Labs    05/16/23 1156 05/17/23 0502  NA 136 134*  K 3.9 4.2  CL 103 102  CO2 22 23  GLUCOSE 232* 126*  BUN 34* 35*  CREATININE 1.19 1.05  CALCIUM 9.5 8.9   Urinalysis    Component Value Date/Time   COLORURINE RED (A) 05/16/2023 1156   APPEARANCEUR TURBID (A) 05/16/2023 1156   LABSPEC  05/16/2023 1156    TEST NOT REPORTED DUE TO COLOR INTERFERENCE OF URINE PIGMENT   PHURINE  05/16/2023 1156    TEST NOT REPORTED DUE TO COLOR INTERFERENCE OF URINE PIGMENT   GLUCOSEU (A) 05/16/2023 1156    TEST NOT REPORTED DUE TO COLOR INTERFERENCE OF URINE PIGMENT   HGBUR (A) 05/16/2023 1156    TEST NOT REPORTED DUE TO COLOR INTERFERENCE OF URINE PIGMENT   BILIRUBINUR (A) 05/16/2023 1156    TEST NOT REPORTED DUE TO COLOR INTERFERENCE OF URINE PIGMENT   KETONESUR (A) 05/16/2023 1156    TEST NOT REPORTED DUE TO COLOR INTERFERENCE OF URINE PIGMENT   PROTEINUR (A) 05/16/2023 1156    TEST NOT REPORTED DUE TO COLOR INTERFERENCE OF URINE PIGMENT   NITRITE (A) 05/16/2023 1156    TEST NOT REPORTED DUE TO COLOR INTERFERENCE OF URINE PIGMENT   LEUKOCYTESUR (A) 05/16/2023 1156    TEST  NOT REPORTED DUE TO COLOR INTERFERENCE OF URINE PIGMENT   Results for orders placed or performed during the hospital encounter of 04/28/23  Blood culture (routine x 2)     Status: None   Collection Time: 04/28/23 11:30 AM   Specimen: BLOOD  Result Value Ref Range Status   Specimen Description BLOOD BLOOD LEFT FOREARM  Final   Special Requests   Final    BOTTLES DRAWN AEROBIC AND ANAEROBIC Blood Culture adequate volume   Culture   Final    NO GROWTH 5 DAYS Performed at [facility], [address] [place], [place] [address]    Report Status 05/03/2023 FINAL  Final  Blood culture (routine x 2)     Status: None   Collection Time: 04/28/23 11:30 AM   Specimen: BLOOD  Result Value Ref Range Status   Specimen Description BLOOD BLOOD  RIGHT FOREARM  Final   Special Requests   Final    BOTTLES DRAWN AEROBIC AND ANAEROBIC Blood Culture results may not be optimal due to an excessive volume of blood received in culture bottles   Culture   Final    NO GROWTH 5 DAYS Performed at [facility], [address], [place], [place] [address]    Report Status 05/03/2023 FINAL  Final  SARS Coronavirus 2 by RT PCR (hospital order, performed in [facility] hospital lab) *cepheid single result test* Anterior Nasal Swab     Status: None   Collection Time: 04/28/23 12:09 PM   Specimen: Anterior Nasal Swab  Result Value Ref Range Status   SARS Coronavirus 2 by RT PCR NEGATIVE NEGATIVE Final    Comment: (NOTE) SARS-CoV-2 target nucleic acids are NOT DETECTED.  The SARS-CoV-2 RNA is generally detectable in upper and lower respiratory specimens during the acute phase of infection. The lowest concentration of SARS-CoV-2 viral copies this assay can detect is 250 copies / mL. A negative result does not preclude SARS-CoV-2 infection and should not be used as the sole basis for treatment or other patient management decisions.  A negative result may occur with improper specimen collection /  handling, submission of specimen other than nasopharyngeal swab, presence of viral mutation(s) within the areas targeted by this assay, and inadequate number of viral copies (<250 copies / mL). A negative result must be combined with clinical observations, patient history, and epidemiological information.  Fact Sheet for Patients:   RoadLapTop.co.za  Fact Sheet for Healthcare Providers: http://kim-miller.com/  This test is not yet approved or  cleared by the [place] FDA and has been authorized for detection and/or diagnosis of SARS-CoV-2 by FDA under an Emergency Use Authorization (EUA).  This EUA will remain in effect (meaning this test can be used) for the duration of the COVID-19 declaration under Section 564(b)(1) of the Act, 21 U.S.C. section 360bbb-3(b)(1), unless the authorization is terminated or revoked sooner.  Performed at [facility], [address] [place], [place] [address]   Respiratory (~20 pathogens) panel by PCR     Status: None   Collection Time: 04/28/23  1:24 PM   Specimen: Nasopharyngeal Swab; Respiratory  Result Value Ref Range Status   Adenovirus NOT DETECTED NOT DETECTED Final   Coronavirus 229E NOT DETECTED NOT DETECTED Final    Comment: (NOTE) The Coronavirus on the Respiratory Panel, DOES NOT test for the novel  Coronavirus (2019 nCoV)    Coronavirus HKU1 NOT DETECTED NOT DETECTED Final   Coronavirus NL63 NOT DETECTED NOT DETECTED Final   Coronavirus OC43 NOT DETECTED NOT DETECTED Final   Metapneumovirus NOT DETECTED NOT DETECTED Final   Rhinovirus / Enterovirus NOT DETECTED NOT DETECTED Final   Influenza A NOT DETECTED NOT DETECTED Final   Influenza B NOT DETECTED NOT DETECTED Final   Parainfluenza Virus 1 NOT DETECTED NOT DETECTED Final   Parainfluenza Virus 2 NOT DETECTED NOT DETECTED Final   Parainfluenza Virus 3 NOT DETECTED NOT DETECTED Final   Parainfluenza Virus 4 NOT DETECTED  NOT DETECTED Final   Respiratory Syncytial Virus NOT DETECTED NOT DETECTED Final   Bordetella pertussis NOT DETECTED NOT DETECTED Final   Bordetella Parapertussis NOT DETECTED NOT DETECTED Final   Chlamydophila pneumoniae NOT DETECTED NOT DETECTED Final   Mycoplasma pneumoniae NOT DETECTED NOT DETECTED Final    Comment: Performed at [facility], [address], [place], [place] [address]  MRSA Next Gen by PCR,  Nasal     Status: None   Collection Time: 04/28/23  5:07 PM   Specimen: Nasal Mucosa; Nasal Swab  Result Value Ref Range Status   MRSA by PCR Next Gen NOT DETECTED NOT DETECTED Final    Comment: (NOTE) The GeneXpert MRSA Assay (FDA approved for NASAL specimens only), is one component of a comprehensive MRSA colonization surveillance program. It is not intended to diagnose MRSA infection nor to guide or monitor treatment for MRSA infections. Test performance is not FDA approved in patients less than 60 years old. Performed at [facility], [address], [place], [place] [address]   Urine Culture     Status: None   Collection Time: 04/29/23 10:00 AM   Specimen: Urine, Random  Result Value Ref Range Status   Specimen Description   Final    URINE, RANDOM Performed at [facility], [address], [place], [place] [address]    Special Requests   Final    NONE Reflexed from [id number] Performed at [facility], [address], [place], [place] [address]    Culture   Final    NO GROWTH Performed at [facility], [address] [place], [place] [address]    Report Status 05/01/2023 FINAL  Final    Radiologic Imaging: US RENAL  Result Date: 05/16/2023 CLINICAL DATA:  Renal colic. EXAM: RENAL / URINARY TRACT ULTRASOUND COMPLETE COMPARISON:  CT renal 05/13/2023. FINDINGS: Right Kidney: Renal measurements: 11.7 x 6.2 x 8 cm = volume: 303 mL. Echogenicity within normal limits. Staghorn calculus measuring up to least 4.5 cm. No mass or  hydronephrosis visualized. Left Kidney: Renal measurements: 17.3 x 7.3 x 7.7 cm = volume: 510 mL. Marked renal cortical scarring/thinning. Echogenicity within normal limits. At least 2.1 cm left staghorn calculus. No mass. Persistent severe hydronephrosis visualized. Urinary bladder: Appears normal for degree of bladder distention. Other: None. IMPRESSION: 1. Persistent severe left hydronephrosis with chronic renal cortical scarring/thinning. 2. Left 2.1 cm staghorn calculus. 3. Nonobstructive right at least 4.5 cm staghorn calculus. 4. Findings better evaluated on CT renal 05/13/2023. Electronically Signed   By: Tish Frederickson M.D.   On: 05/16/2023 18:37   DG Chest 2 View  Result Date: 05/16/2023 CLINICAL DATA:  Shortness of breath. EXAM: CHEST - 2 VIEW COMPARISON:  May 13, 2023. FINDINGS: Stable cardiomediastinal silhouette. Large hiatal hernia is again noted. Both lungs are clear. The visualized skeletal structures are unremarkable. IMPRESSION: No active cardiopulmonary disease.  Large hiatal hernia. Electronically Signed   By: Lupita Raider M.D.   On: 05/16/2023 12:21    Assessment & Plan:  67 year old comorbid male with recent NSTEMI s/p PCI to proximal LAD and RCA on DAPT with aspirin and Plavix, HFrEF, and nephrolithiasis with bilateral partial staghorn stones and chronic left ureteral stone with left hydronephrosis and left renal atrophy now with symptomatic anemia due to gross hematuria.  I had a lengthy conversation with the patient and Dr. Nelson Chimes at the bedside today.  We discussed that we are at a very challenging position right now with managing his gross hematuria and extensive stone burden in the setting of recent NSTEMI and need for DAPT.  No evidence of clot retention on RUS on arrival; will defer CBI due to low expectation of clinical benefit.  With no leukocytosis, recent sterile urine culture, and normal renal function, no indication for urgent left ureteral stent placement at this  time.  Furthermore, a ureteral stent is likely  to worsen his bleeding and I do not think it is in his best interest.  He will be a poor surgical candidate for PCNL for management of his staghorn stones given the medical necessity of DAPT in the setting of recent NSTEMI.  I will work with my team to get his outpatient follow-up with Dr. Richardo Hanks moved up sooner to discuss definitive stone management, likely with staged ureteroscopy. In the meantime, will defer to primary team and cardiology to manage symptomatic anemia and DAPT. I agree that he would benefit from close outpatient follow up and hemoglobin monitoring with consideration of outpatient transfusion as a temporizing measure while he awaits further urologic intervention.  Thank you for involving me in this patient's care, please page with any further questions or concerns.  Carman Ching, PA-C 05/17/2023 8:43 AM

## 2023-05-17 NOTE — Consult Note (Signed)
Cardiology Consultation   Patient ID: Ronnie Mann MRN: [id number]; DOB: 12/07/1955  Admit date: 05/16/2023 Date of Consult: 05/17/2023  PCP:  Ronnie Mann, No   [facility] Providers Cardiologist:  Ronnie Nordmann, MD        Patient Profile:   Ronnie Mann is a 67 y.o. male with a hx of kidney stones, coronary artery disease status post recent NSTEMI with PCI to the pLAD and PCI/DES to the RCA (05/01/23), HFrEF (30-35%), ischemic cardiomyopathy, hiatal hernia, and nephrolithiasis, who is being seen 05/17/2023 for the evaluation of hematuria at the request of Dr. Nelson Mann.  History of Present Illness:   Ronnie Mann is a significant history of nephrolithiasis requiring multiple surgeries and lithotripsies in the past.  He arrived to [facility] emergency department 04/28/23 reporting right kidney pain that had been ongoing related but extubated time.  He reported upper respiratory infection with congestion and runny nose for the week prior to his admission.  He also had new onset of substernal vise like substernal chest pain that radiated to his left arm.  He also reported ongoing shortness of breath associated with this he was woken from sleep in the program to a cold sweat.  In the ED he was noted to have an elevated lactic acid and elevated WBCs of 14.4.  He was given a liter of IV fluids at this time.  Chest x-ray was done which showed pulmonary edema and his BNP came back at 538.  IV fluid boluses were discontinued at that time initial troponin was 54 progressed to 34.  CT angiogram was negative for potential staghorn calculus and left hydronephrosis.  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  EKG shows sinus tach with right bundle branch block bifascicular block and no real acute ST-T wave changes.  Echocardiogram revealed an LVEF of 30-35%, he developed flash pulmonary edema during his left heart catheterization on 6/10 and  was treated with nitro drip.  Diuretics were held in the setting of deteriorating kidney function.  He was noted during his last hospitalization of AKI in the setting of aggressive diuretics.  Lasix was given 6/13 in the a.m. and was held since that time Ronnie Mann was on hold with improving creatinine.  He underwent heart catheterization and had successful balloon angioplasty to the proximal LAD with DES stent placement to the proximal right coronary artery.  He was to be continued on aspirin, high intensity statin, and Brilinta.  He was unable to be started on carvedilol due to hypotension.  He was also evaluated by urology during his hospitalization but he refused to repeat the stent placement and would need to be seen outpatient and was empirically treated with Rocephin.  He was also noted to have community-acquired pneumonia and finished his IV Rocephin and the Zithromax.  He was considered stable for discharge and discharged from the facility on 05/06/2023.  Patient presented to the [facility] emergency department on 05/16/2023 with complaints of shortness of breath and left kidney stone pain.  He stated he had been having several spells or episodes of chest pressure and pain to his left eye which is similar I felt prior to his heart attack.  He describes the pain as a tight pressure that does radiate into the left arm he states it is short-lived but cannot be with rest or exertion.  He has noted no aggravating or alleviating factors.  Unfortunately has not taken any  nitro as he states he was not discharged from the facility with any.  He stated he also been having ongoing hematuria and is passing what he thought to be kidney stones.  He stated his most recent spell happened when he was trying to get his son out of the bathroom for EMS to bring him to the [facility].  He stated EMS and checked him out at that time as well because he was having some shortness of breath but all of his vital signs were stable.  He did note  that he may have missed a dose of Brilinta.  He also noted 1 day where he had noted blood in his stool.   Initial vital signs: Blood pressure 146/76, pulse of 98, respirations 22, temperature 98.5  Pertinent lab: Blood glucose of 232, BUN 34, hemoglobin 7.5, high-sensitivity troponin 65  Imaging: Chest x-ray does not reveal any acute cardiopulmonary process  Medications administered in the emergency department: 4 mg of pain IVP  Cardiology was consulted today for assistance with stopping aspirin and Brilinta due to gross hematuria status post NSTEMI with PCI   Past Medical History:  Diagnosis Date   Coronary artery disease 05/01/2023   PCI/DES   HFrEF (heart failure with reduced ejection fraction) (HCC) 05/01/2023   Hiatal hernia    Ischemic cardiomyopathy 05/01/2023   LVEF 30-35%   Kidney stones     Past Surgical History:  Procedure Laterality Date   CORONARY STENT INTERVENTION N/A 05/01/2023   Procedure: CORONARY STENT INTERVENTION;  Surgeon: Ronnie Ouch, MD;  Location: [facility] INVASIVE CV LAB;  Service: Cardiovascular;  Laterality: N/A;   LEFT HEART CATH AND CORONARY ANGIOGRAPHY N/A 05/01/2023   Procedure: LEFT HEART CATH AND CORONARY ANGIOGRAPHY;  Surgeon: Ronnie Ouch, MD;  Location: [facility] INVASIVE CV LAB;  Service: Cardiovascular;  Laterality: N/A;     Home Medications:  Prior to Admission medications   Medication Sig Start Date End Date Taking? Authorizing Provider  ALPRAZolam Prudy Feeler) 0.5 MG tablet Take 1 tablet (0.5 mg total) by mouth 3 (three) times daily as needed for anxiety. 05/06/23  Yes Mann, Ronnie B, MD  aspirin EC 81 MG tablet Take 1 tablet (81 mg total) by mouth daily. Swallow whole. 05/07/23  Yes Mann, Ronnie B, MD  atorvastatin (LIPITOR) 40 MG tablet Take 1 tablet (40 mg total) by mouth daily. 05/06/23 06/05/23 Yes Mann, Ronnie Sports, MD  oxyCODONE-acetaminophen (PERCOCET) 5-325 MG tablet Take 1 tablet by mouth every 6 (six) hours as needed for up  to 3 days for severe pain. 05/14/23 05/17/23 Yes Ronnie Cheek, MD  tamsulosin [facility]) 0.4 MG CAPS capsule Take 1 capsule (0.4 mg total) by mouth daily after breakfast. 05/07/23 06/06/23 Yes Mann, Ronnie Sports, MD  ticagrelor (BRILINTA) 90 MG TABS tablet Take 1 tablet (90 mg total) by mouth 2 (two) times daily. 05/06/23 06/05/23 Yes Tresa Moore, MD    Inpatient Medications: Scheduled Meds:  aspirin EC  81 mg Oral Daily   atorvastatin  40 mg Oral Daily   clopidogrel  300 mg Oral Daily   [START ON 05/18/2023] clopidogrel  75 mg Oral Daily   melatonin  10 mg Oral QHS   metoprolol succinate  12.5 mg Oral Daily   tamsulosin  0.4 mg Oral QPC breakfast   Continuous Infusions:  sodium chloride Stopped (05/16/23 1959)   PRN Meds: acetaminophen **OR** acetaminophen, ALPRAZolam, HYDROmorphone (DILAUDID) injection, ondansetron **OR** ondansetron (ZOFRAN) IV, oxyCODONE-acetaminophen, polyethylene glycol  Allergies:   No Known Allergies  Social History:   Social History   Socioeconomic History   Marital status: Divorced    Spouse name: Not on file   Number of children: Not on file   Years of education: Not on file   Highest education level: Not on file  Occupational History   Not on file  Tobacco Use   Smoking status: Never   Smokeless tobacco: Never  Substance and Sexual Activity   Alcohol use: Never   Drug use: Never   Sexual activity: Not on file  Other Topics Concern   Not on file  Social History Narrative   Not on file   Social Determinants of Health   Financial Resource Strain: Not on file  Food Insecurity: No Food Insecurity (05/16/2023)   Hunger Vital Sign    Worried About Running Out of Food in the Last Year: Never true    Ran Out of Food in the Last Year: Never true  Transportation Needs: No Transportation Needs (05/16/2023)   PRAPARE - Administrator, Civil Service (Medical): No    Lack of Transportation (Non-Medical): No  Physical Activity: Not  on file  Stress: Not on file  Social Connections: Not on file  Intimate Partner Violence: Not At Risk (05/16/2023)   Humiliation, Afraid, Rape, and Kick questionnaire    Fear of Current or Ex-Partner: No    Emotionally Abused: No    Physically Abused: No    Sexually Abused: No    Family History:   History reviewed. No pertinent family history.   ROS:  Please see the history of present illness.  Review of Systems  Respiratory:  Positive for shortness of breath.   Cardiovascular:  Positive for chest pain and leg swelling.  Gastrointestinal:  Positive for blood in stool.  Genitourinary:  Positive for hematuria.  Neurological:  Positive for weakness.    All other ROS reviewed and negative.     Physical Exam/Data:   Vitals:   05/17/23 0435 05/17/23 1209 05/17/23 1215 05/17/23 1231  BP:  (!) 147/80  126/72  Pulse:  85  68  Resp:   13   Temp:  98 F (36.7 C)  98 F (36.7 C)  TempSrc:  Oral  Oral  SpO2:  98%  99%  Weight: 89.8 kg     Height:        Intake/Output Summary (Last 24 hours) at 05/17/2023 1338 Last data filed at 05/17/2023 1108 Gross per 24 hour  Intake 986.58 ml  Output 500 ml  Net 486.58 ml      05/17/2023    4:35 AM 05/16/2023    9:04 PM 05/13/2023    9:05 PM  Last 3 Weights  Weight (lbs) 197 lb 14.4 oz 200 lb 9.9 oz 200 lb  Weight (kg) 89.767 kg 91 kg 90.719 kg     Body mass index is 29.22 kg/m.  General:  Well nourished, well developed, in no acute distress HEENT: normal Neck: no JVD Vascular: No carotid bruits; Distal pulses 2+ bilaterally Cardiac:  normal S1, S2; RRR; tachycardic no murmur  Lungs:  clear to auscultation bilaterally, no wheezing, rhonchi or rales  Abd: soft, nontender, no hepatomegaly  Ext: no edema Musculoskeletal:  No deformities, BUE and BLE strength normal and equal Skin: warm and dry  Neuro:  CNs 2-12 intact, no focal abnormalities noted Psych:  Normal affect   EKG:  The EKG was personally reviewed and demonstrates:  Sinus tach rate of 100 with chronic right bundle branch  block and left anterior fascicular block Telemetry:  Telemetry was personally reviewed and demonstrates: Rhythm with PACs and PVCs rates of 70-80  Relevant CV Studies: LHC 05/01/23   Prox LAD to Mid LAD lesion is 99% stenosed.   Mid LAD lesion is 60% stenosed.   1st Diag lesion is 70% stenosed.   Prox RCA lesion is 95% stenosed.   Mid RCA lesion is 30% stenosed.   RPDA lesion is 60% stenosed.   Dist LAD lesion is 99% stenosed.   A drug-eluting stent was successfully placed using a STENT ONYX FRONTIER [id number].   Balloon angioplasty was performed using a BALLN [place] EUPHORA RX [id number].   Post intervention, there is a 20% residual stenosis.   Post intervention, there is a 0% residual stenosis.   There is severe left ventricular systolic dysfunction.   LV end diastolic pressure is severely elevated.   There is moderate (3+) mitral regurgitation.   1.  Severe two-vessel coronary artery disease with subtotal occlusion of the proximal LAD, diffuse moderate mid LAD disease and severe distal LAD disease.  In addition, there is 95% in the proximal right coronary artery with faint right to left collaterals to the LAD.  The left circumflex has mild nonobstructive disease and OM 3 is large and reaches the apex. 2.  Severely reduced LV systolic function with an EF of 25%.  Severely elevated left ventricular end-diastolic pressure at 34 mmHg. 3.  Successful balloon angioplasty to the proximal LAD and drug-eluting stent placement to the proximal right coronary artery.  TTE 04/30/23 1. Left ventricular ejection fraction, by estimation, is 30 to 35%. Left  ventricular ejection fraction by PLAX is 32 %. The left ventricle has  moderately decreased function. The left ventricle demonstrates global  hypokinesis with severe hypokinesis of  the anterior, anteroseptal and apical region. The left ventricular  internal cavity size was mildly dilated. There is mild  left ventricular  hypertrophy. Left ventricular diastolic parameters are consistent with  Grade II diastolic dysfunction  (pseudonormalization).   2. Right ventricular systolic function is normal. The right ventricular  size is normal. There is normal pulmonary artery systolic pressure.   3. The mitral valve is normal in structure. Moderate mitral valve  regurgitation. No evidence of mitral stenosis.   4. The aortic valve is normal in structure. Aortic valve regurgitation is  not visualized. No aortic stenosis is present.   5. There is mild dilatation of the ascending aorta, measuring 42 mm.   6. The inferior vena cava is normal in size with <50% respiratory  variability, suggesting right atrial pressure of 8 mmHg.   Laboratory Data:  High Sensitivity Troponin:   Recent Labs  Lab 04/28/23 1347 04/29/23 0504 05/13/23 2116 05/13/23 2308 05/16/23 1156  TROPONINIHS 234* 23,432* 29* 31* 65*     Chemistry Recent Labs  Lab 05/13/23 2116 05/16/23 1156 05/17/23 0502  NA 129* 136 134*  K 4.4 3.9 4.2  CL 101 103 102  CO2 19* 22 23  GLUCOSE 162* 232* 126*  BUN 28* 34* 35*  CREATININE 1.10 1.19 1.05  CALCIUM 9.0 9.5 8.9  GFRNONAA >60 >60 >60  ANIONGAP 9 11 9     Recent Labs  Lab 05/13/23 2116 05/16/23 1156  PROT 7.3 7.0  ALBUMIN 3.9 3.8  AST 27 35  ALT 24 22  ALKPHOS 101 91  BILITOT 0.9 0.7   Lipids No results for input(s): "CHOL", "TRIG", "HDL", "LABVLDL", "LDLCALC", "CHOLHDL" in the last 168 hours.  Hematology Recent  Labs  Lab 05/13/23 2116 2023/05/22 1156 2023/05/22 2259 05/17/23 0502  WBC 8.5 6.8  --  6.5  RBC 2.66* 2.34*  --  2.54*  HGB 8.5* 7.5* 8.3* 7.9*  HCT 26.0* 22.9*  --  24.6*  MCV 97.7 97.9  --  96.9  MCH 32.0 32.1  --  31.1  MCHC 32.7 32.8  --  32.1  RDW 14.5 14.1  --  15.1  PLT 288 272  --  245   Thyroid No results for input(s): "TSH", "FREET4" in the last 168 hours.  BNP Recent Labs  Lab 05/22/23 1156  BNP 245.5*    DDimer No results for  input(s): "DDIMER" in the last 168 hours.   Radiology/Studies:  US RENAL  Result Date: 05/22/23 CLINICAL DATA:  Renal colic. EXAM: RENAL / URINARY TRACT ULTRASOUND COMPLETE COMPARISON:  CT renal 05/13/2023. FINDINGS: Right Kidney: Renal measurements: 11.7 x 6.2 x 8 cm = volume: 303 mL. Echogenicity within normal limits. Staghorn calculus measuring up to least 4.5 cm. No mass or hydronephrosis visualized. Left Kidney: Renal measurements: 17.3 x 7.3 x 7.7 cm = volume: 510 mL. Marked renal cortical scarring/thinning. Echogenicity within normal limits. At least 2.1 cm left staghorn calculus. No mass. Persistent severe hydronephrosis visualized. Urinary bladder: Appears normal for degree of bladder distention. Other: None. IMPRESSION: 1. Persistent severe left hydronephrosis with chronic renal cortical scarring/thinning. 2. Left 2.1 cm staghorn calculus. 3. Nonobstructive right at least 4.5 cm staghorn calculus. 4. Findings better evaluated on CT renal 05/13/2023. Electronically Signed   By: Tish Frederickson M.D.   On: 05-22-23 18:37   DG Chest 2 View  Result Date: 2023/05/22 CLINICAL DATA:  Shortness of breath. EXAM: CHEST - 2 VIEW COMPARISON:  May 13, 2023. FINDINGS: Stable cardiomediastinal silhouette. Large hiatal hernia is again noted. Both lungs are clear. The visualized skeletal structures are unremarkable. IMPRESSION: No active cardiopulmonary disease.  Large hiatal hernia. Electronically Signed   By: Lupita Raider M.D.   On: May 22, 2023 12:21   CT Renal Stone Study  Result Date: 05/13/2023 CLINICAL DATA:  Flank pain, weakness, shortness of breath EXAM: CT ABDOMEN AND PELVIS WITHOUT CONTRAST TECHNIQUE: Multidetector CT imaging of the abdomen and pelvis was performed following the standard protocol without IV contrast. RADIATION DOSE REDUCTION: This exam was performed according to the departmental dose-optimization program which includes automated exposure control, adjustment of the mA and/or kV  according to patient size and/or use of iterative reconstruction technique. COMPARISON:  04/28/2023 FINDINGS: Lower chest: No acute pleural or parenchymal lung disease. Stable hiatal hernia. Hepatobiliary: Stable cholelithiasis with evidence of chronic gallbladder wall thickening at the fundus. No pericholecystic fat stranding to suggest acute cholecystitis. Unremarkable unenhanced appearance of the liver. Pancreas: Unremarkable unenhanced appearance. Spleen: Unremarkable unenhanced appearance. Adrenals/Urinary Tract: Large staghorn type calculi are seen within the right kidney, largest measuring 4.1 x 5.1 cm, unchanged since prior exam. No right-sided obstruction. There are multiple large left renal calculi, largest in the lower pole measuring up to 2.1 cm. An obstructing 8 mm proximal left ureteral calculus reference image 51/2 is unchanged. Stable left-sided hydronephrosis, likely chronic given extensive left renal cortical thinning. Stable left renal cortical cysts do not require specific imaging follow-up. Stable 1.5 cm left adrenal adenoma. The right adrenal is stable. Bladder is unremarkable. Stomach/Bowel: No bowel obstruction or ileus. Normal appendix right lower quadrant. No bowel wall thickening or inflammatory change. Vascular/Lymphatic: Aortic atherosclerosis. No enlarged abdominal or pelvic lymph nodes. Reproductive: Stable mild enlargement of the prostate.  Other: No free fluid or free intraperitoneal gas. No abdominal wall hernia. Musculoskeletal: No acute or destructive bony abnormalities. Reconstructed images demonstrate no additional findings. IMPRESSION: 1. Stable chronic severe left-sided hydronephrosis with marked left renal cortical thinning, due to an obstructing 8 mm proximal left ureteral calculus. No change in position of the calculus since prior study. 2. 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 Mucosal thickening of the right renal pelvis is unchanged since prior exam, likely  representing chronic inflammation. Ureteroscopy may be useful if further evaluation is desired. 3. Nonobstructing left renal calculi. 4. Cholelithiasis, with persistent wall thickening of the gallbladder fundus most consistent with chronic cholecystitis. No acute inflammatory changes. 5.  Aortic Atherosclerosis (ICD10-I70.0). 6. Large hiatal hernia. Electronically Signed   By: Sharlet Salina M.D.   On: 05/13/2023 22:39   DG Chest Port 1 View  Result Date: 05/13/2023 CLINICAL DATA:  Short of breath EXAM: PORTABLE CHEST 1 VIEW COMPARISON:  04/28/2023 FINDINGS: Single frontal view of the chest demonstrates stable enlargement of the cardiac silhouette. Hiatal hernia again noted. Improved volume status since prior study, with resolution of the interstitial and ground-glass opacities throughout the majority of the lungs. Minimal densities remain at the lung bases. There may be a small left pleural effusion. No pneumothorax. IMPRESSION: 1. Mild residual pulmonary edema, with significant improvement in volume status since prior study. 2. Small left pleural effusion. 3. Stable hiatal hernia. Electronically Signed   By: Sharlet Salina M.D.   On: 05/13/2023 21:50     Assessment and Plan:   Symptomatic anemia with gross hematuria -Hemoglobin 7.5, 7.9 repeat cbc this morning -2 units packed RBCs was ordered -Continued with shortness of breath with some occasional chest discomfort -Continues to have gross hematuria -UTI symptoms with urine culture sent previously -Recommend maintaining hemoglobin greater than 8 -FOBT negative  -daily CBC -Urology consulted -Continued management by neurology  Elevated high-sensitivity troponin with recent NSTEMI/coronary artery disease -Previously had episode of chest discomfort -High-sensitivity troponin trended 29- 65, likely secondary to symptomatic anemia -Status post PCI/DES 2 weeks prior to the proximal LAD PCI and PCI/DES to RCA on 05/01/2023 -Brilinta discontinued  and patient loaded with 300 mg of clopidogrel will start 75 mg clopidogrel daily -Restarted on aspirin therapy of 81 mg daily -Continued on statin therapy -Recommend keeping hemoglobin greater than 8  Ischemic cardiomyopathy/HFrEF -Shortness of breath of the last 2 weeks likely related to symptomatic anemia -LVEF 30-35% previous admission -Continued on Toprol-XL 12.5 mg daily -Currently maintaining oxygen saturations on room air -Continue to escalate GDMT as tolerated -Not ideal candidate for SGLT2 inhibitor due to UTI and hematuria -Continue with heart failure education -Daily weights, I's and O's, low-sodium diet  Hydronephrosis of the left kidney/staghorn calculus -Continues to have complaints of flank pain -Previous scans done last hospitalization revealed stable findings with nonobstructing stones -Continued management by urology and IM    Risk Assessment/Risk Scores:          For questions or updates, please contact [facility] Please consult [email or web address] for contact info under    Signed, Amandamarie Feggins, NP  05/17/2023 1:38 PM

## 2023-05-18 DIAGNOSIS — I5022 Chronic systolic (congestive) heart failure: Secondary | ICD-10-CM | POA: Diagnosis not present

## 2023-05-18 DIAGNOSIS — R310 Gross hematuria: Secondary | ICD-10-CM | POA: Diagnosis not present

## 2023-05-18 LAB — CBC
HCT: 24.9 % — ABNORMAL LOW (ref 39.0–52.0)
Hemoglobin: 8.1 g/dL — ABNORMAL LOW (ref 13.0–17.0)
MCH: 31.8 pg (ref 26.0–34.0)
MCHC: 32.5 g/dL (ref 30.0–36.0)
MCV: 97.6 fL (ref 80.0–100.0)
Platelets: 203 10*3/uL (ref 150–400)
RBC: 2.55 MIL/uL — ABNORMAL LOW (ref 4.22–5.81)
RDW: 15.6 % — ABNORMAL HIGH (ref 11.5–15.5)
WBC: 5.9 10*3/uL (ref 4.0–10.5)
nRBC: 0 % (ref 0.0–0.2)

## 2023-05-18 LAB — BPAM RBC
Blood Product Expiration Date: 202407072359
ISSUE DATE / TIME: 202406251823

## 2023-05-18 LAB — BASIC METABOLIC PANEL
Anion gap: 3 — ABNORMAL LOW (ref 5–15)
BUN: 25 mg/dL — ABNORMAL HIGH (ref 8–23)
CO2: 26 mmol/L (ref 22–32)
Calcium: 8.7 mg/dL — ABNORMAL LOW (ref 8.9–10.3)
Chloride: 106 mmol/L (ref 98–111)
Creatinine, Ser: 0.99 mg/dL (ref 0.61–1.24)
GFR, Estimated: >60 mL/min (ref >60)
Glucose, Bld: 122 mg/dL — ABNORMAL HIGH (ref 70–99)
Potassium: 4.4 mmol/L (ref 3.5–5.1)
Sodium: 135 mmol/L (ref 135–145)

## 2023-05-18 LAB — HEMOGLOBIN AND HEMATOCRIT, BLOOD
HCT: 28 % — ABNORMAL LOW (ref 39.0–52.0)
Hemoglobin: 9.1 g/dL — ABNORMAL LOW (ref 13.0–17.0)

## 2023-05-18 LAB — TYPE AND SCREEN: Antibody Screen: NEGATIVE

## 2023-05-18 LAB — PREPARE RBC (CROSSMATCH)

## 2023-05-18 LAB — GLUCOSE, CAPILLARY: Glucose-Capillary: 126 mg/dL — ABNORMAL HIGH (ref 70–99)

## 2023-05-18 MED ORDER — SODIUM CHLORIDE 0.9% IV SOLUTION: Freq: Once | INTRAVENOUS | Status: AC

## 2023-05-18 MED ORDER — LOSARTAN POTASSIUM 25 MG PO TABS
12.5000 mg | ORAL_TABLET | Freq: Every day | ORAL | Status: DC
  Administered 2023-05-18 – 2023-05-21 (×4): 12.5 mg via ORAL
  Filled 2023-05-18 (×4): qty 1

## 2023-05-18 NOTE — TOC Initial Note (Signed)
Transition of Care [facility]) - Initial/Assessment Note    Patient Details  Name: Ronnie Mann MRN: [id number] Date of Birth: 1956/10/12  Transition of Care [facility]) CM/SW Contact:    Margarito Liner, LCSW Phone Number: 05/18/2023, 10:31 AM  Clinical Narrative:  CSW met with patient. No supports at bedside. CSW introduced role and explained that discharge planning would be discussed. Patient does not have a PCP at this time. He was supposed to go to [facility] but started having medical issues. He has their contact information at home to call them and schedule initial appointment. Patient can drive himself to appointments but if needed can use transportation through [place]. Pharmacy is [facility] [address]. No issues obtaining medications. He lives home with his son. No home health or DME use prior to admission but he does have a SPC at home if needed. Patient confirmed he has a scale at home but was not weighing himself daily prior to admission. He plans to do so once he returns home. No further concerns. CSW encouraged patient to contact CSW as needed. CSW will continue to follow patient for support and facilitate return home once stable. Son will transport him at discharge.                Expected Discharge Plan: Home/Self Care Barriers to Discharge: Continued Medical Work up   Patient Goals and CMS Choice            Expected Discharge Plan and Services     Post Acute Care Choice: NA Living arrangements for the past 2 months: Single Family Home                                      Prior Living Arrangements/Services Living arrangements for the past 2 months: Single Family Home Lives with:: Adult Children Patient language and need for interpreter reviewed:: Yes Do you feel safe going back to the place where you live?: Yes      Need for Family Participation in Patient Care: Yes (Comment) Care giver support system in place?: Yes (comment)   Criminal Activity/Legal  Involvement Pertinent to Current Situation/Hospitalization: No - Comment as needed  Activities of Daily Living Home Assistive Devices/Equipment: None ADL Screening (condition at time of admission) Patient's cognitive ability adequate to safely complete daily activities?: Yes Is the patient deaf or have difficulty hearing?: No Does the patient have difficulty seeing, even when wearing glasses/contacts?: No Does the patient have difficulty concentrating, remembering, or making decisions?: No Patient able to express need for assistance with ADLs?: Yes Does the patient have difficulty dressing or bathing?: Yes Independently performs ADLs?: Yes (appropriate for developmental age) Does the patient have difficulty walking or climbing stairs?: No Weakness of Legs: None Weakness of Arms/Hands: None  Permission Sought/Granted                  Emotional Assessment Appearance:: Appears stated age Attitude/Demeanor/Rapport: Engaged, Gracious Affect (typically observed): Accepting, Appropriate, Calm, Pleasant Orientation: : Oriented to Self, Oriented to Place, Oriented to  Time, Oriented to Situation Alcohol / Substance Use: Not Applicable Psych Involvement: No (comment)  Admission diagnosis:  Gross hematuria [R31.0] Symptomatic anemia [D64.9] Hematuria, unspecified type [R31.9] Patient Active Problem List   Diagnosis Date Noted   Chronic HFrEF (heart failure with reduced ejection fraction) (HCC) 05/17/2023   Gross hematuria 05/16/2023   UTI symptoms 05/16/2023   Symptomatic  anemia 05/16/2023   Hematuria 05/16/2023   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 heart with stable angina pectoris (HCC) 05/04/2023   Ischemic cardiomyopathy 05/03/2023   Acute hyperglycemia 05/02/2023   Acute systolic heart failure (HCC) 05/01/2023   Non-ST elevation (NSTEMI) myocardial infarction (HCC) 04/29/2023   CHF (congestive heart failure) (HCC) 04/28/2023   Elevated troponin 04/28/2023    Substernal chest pain 04/28/2023   Staghorn calculus 04/28/2023   Lactic acidosis 04/28/2023   AKI (acute kidney injury) (HCC) 04/28/2023   Hydronephrosis of left kidney 04/28/2023   Wheezing 04/28/2023   Uncontrolled type 2 diabetes mellitus with hyperglycemia, without long-term current use of insulin (HCC) 04/28/2023   Essential hypertension 04/28/2023   Hiatal hernia 04/28/2023   Cholelithiasis 04/28/2023   Shortness of breath 04/28/2023   Viral URI 04/28/2023   PCP:  Pcp, No Pharmacy:   [facility] [address] [place] [address] Phone: [phone] Fax: [phone]  [facility] [address] [place], [place] - [address] [place] [address] Phone: [phone] Fax: [phone]     Social Determinants of Health (SDOH) Social History: SDOH Screenings   Food Insecurity: No Food Insecurity (05/16/2023)  Housing: Low Risk  (05/16/2023)  Transportation Needs: No Transportation Needs (05/16/2023)  Utilities: Not At Risk (05/16/2023)  Tobacco Use: Low Risk  (05/17/2023)   SDOH Interventions:     Readmission Risk Interventions     No data to display

## 2023-05-18 NOTE — Assessment & Plan Note (Signed)
Symptomatic anemia. Patient with history of chronic nephrolithiasis and most recent CT abdomen showing staghorn and obstructing stones.  Symptoms are  consistent with renal colic.  Recently placed on DAPT with aspirin and Brilinta.   -Urology consult-recommending close outpatient follow-up for possible cystoscopy and ureteral stent placement. -Ordered 1 more unit of PRBC-total of 3 units so far -keep hemoglobin above 8 with recent NSTEMI. -Renal ultrasound-with persistent severe left hydronephrosis with chronic renal cortical scarring/thinning.  Nonobstructive right staghorn calculus -Pain management

## 2023-05-18 NOTE — Assessment & Plan Note (Signed)
UA with bacteriuria.  Patient has bilateral renal colic.  Recent UA was also positive for pyuria and bacteriuria but urine cultures were sterile. No leukocytosis.  Received a dose of ceftriaxone in ED and urine cultures were sent.  Repeat  urine cultures with no growth -No need for antibiotics

## 2023-05-18 NOTE — Progress Notes (Signed)
Progress Note   Patient: Ronnie Mann YQI:[id number] DOB: 1956/04/18 DOA: 05/16/2023     2 DOS: the patient was seen and examined on 05/18/2023   Brief hospital course: Ronnie Mann is a 67 y.o. male with medical history significant of recent NSTEMI, s/p PCI to proximal LAD and RCA on 05/01/23, HFrEF(EF 30 to 35%), and nephrolithiasis presented to ED with shortness of breath, bilateral flank pain radiating towards lower abdomen, more on right than left, dysuria and gross hematuria.  Patient with history of bilateral nephrolithiasis, symptoms consistent with symptomatic anemia and renal colic.  Hemoglobin 7.5.  Patient did received a dose of ceftriaxone in ED which was not continued.  Less likely UTI.  Urology was consulted and 2 unit of PRBC ordered.  6/26: Vital stable.  Continue to have gross hematuria.  Cardiology replace Brilinta with Plavix and he will continue with DAPT for now.  Apparently received just 1 unit due to blood bank protocol, hemoglobin at 7.9 this morning.  Ordered second unit.  Goal hemoglobin above 8 due to recent NSTEMI.  Also starting on iron supplement. Urology is recommending close outpatient follow-up with a possible cystoscope and ureteral stent placement which they will arrange as outpatient.  6/27: Continue to have significant hematuria, hemoglobin decreased to 8.1 this morning after increasing to 9.1 after getting a unit of blood yesterday.  Ordered 1 more unit.  Cardiology would like to continue DAPT for at least 1 month.   Assessment and Plan: * Gross hematuria Symptomatic anemia. Patient with history of chronic nephrolithiasis and most recent CT abdomen showing staghorn and obstructing stones.  Symptoms are  consistent with renal colic.  Recently placed on DAPT with aspirin and Brilinta.   -Urology consult-recommending close outpatient follow-up for possible cystoscopy and ureteral stent placement. -Ordered 1 more unit of PRBC-total of 3 units so far -keep  hemoglobin above 8 with recent NSTEMI. -Renal ultrasound-with persistent severe left hydronephrosis with chronic renal cortical scarring/thinning.  Nonobstructive right staghorn calculus -Pain management  Staghorn calculus Patient was found to have right-sided staghorn calculus and left-sided hydronephrosis with a obstructing stone on most recent CT renal stone study done on 05/13/2023.  Might be contributory to gross hematuria.  Renal ultrasound confirmed. -Urology consult-recommending close outpatient follow-up.  Will be high risk for definitive treatment of this type of stones due to being on DAPT and recent NSTEMI  Hydronephrosis of left kidney Patient has a chronic stable left renal hydronephrosis with marked renal cortical thinning due to an obstructing proximal left ureteral calculus. -Urology was consulted  Elevated troponin Patient with history of recent NSTEMI, s/p cardiac catheterization and PCI to proximal LAD and RCA on 05/01/2023. Slowly rising troponin 29>>65 -Cardiology was consulted and they switched Brilinta with Plavix, would like to continue DAPT due to recent stent placement unless there is a life-threatening bleeding. -Keep hemoglobin above 8  Ischemic cardiomyopathy Recent diagnosis of acute systolic heart failure with EF of 30 to 35% with NSTEMI.  S/p cardiac cath and PCI to LAD and RCA. -Cardiology consult -switched Brilinta with Plavix. -Continue statin and carvedilol -Daily weight and BMP -Strict intake and output  Acute hyperglycemia Patient was found to have blood glucose above 200, also noted to have elevated blood glucose level during most recent admissions and A1c was checked and it was 5.5. Patient was placed on SGL 2 inhibitor on discharge. -Continue to monitor  UTI symptoms UA with bacteriuria.  Patient has bilateral renal colic.  Recent UA was also  positive for pyuria and bacteriuria but urine cultures were sterile. No leukocytosis.  Received a dose  of ceftriaxone in ED and urine cultures were sent.  Repeat  urine cultures with no growth -No need for antibiotics   Subjective: Patient continued to have significant gross hematuria.  Pain improving.  Wants to go home.  Physical Exam: Vitals:   05/18/23 1308 05/18/23 1516 05/18/23 1531 05/18/23 1606  BP: 117/81 127/84 119/85 117/78  Pulse: 77 83 80 77  Resp: 20 14 16 18   Temp: (!) 97.4 F (36.3 C) (!) 97.5 F (36.4 C) 98 F (36.7 C) (!) 97.5 F (36.4 C)  TempSrc:  Axillary Oral   SpO2: 100% 98% 99% 95%  Weight:      Height:       General.  Well-developed gentleman, in no acute distress. Pulmonary.  Lungs clear bilaterally, normal respiratory effort. CV.  Regular rate and rhythm, no JVD, rub or murmur. Abdomen.  Soft, nontender, nondistended, BS positive. CNS.  Alert and oriented .  No focal neurologic deficit. Extremities.  No edema, no cyanosis, pulses intact and symmetrical. Psychiatry.  Judgment and insight appears normal.   Data Reviewed: Prior data reviewed  Family Communication: Talked with son on phone.  Disposition: Status is: Inpatient Remains inpatient appropriate because: Severity of illness  Planned Discharge Destination: Home  DVT prophylaxis.  SCDs Time spent: 45 minutes  This record has been created using Conservation officer, historic buildings. Errors have been sought and corrected,but may not always be located. Such creation errors do not reflect on the standard of care.   Author: Arnetha Courser, MD 05/18/2023 4:15 PM  For on call review [email or web address].

## 2023-05-18 NOTE — Progress Notes (Addendum)
Rounding Note    Patient Name: Ronnie Mann Date of Encounter: 05/18/2023  [facility] Cardiologist: Julien Nordmann, MD   Subjective   Patient reports persistent blood in urine, but improved. He is wanting to go home. He denies chest pain of SOB. Hgb 8.2  Inpatient Medications    Scheduled Meds:  aspirin EC  81 mg Oral Daily   atorvastatin  40 mg Oral Daily   clopidogrel  75 mg Oral Daily   Fe Fum-Vit C-Vit B12-FA  1 capsule Oral BID   melatonin  10 mg Oral QHS   metoprolol succinate  12.5 mg Oral Daily   tamsulosin  0.4 mg Oral QPC breakfast   Continuous Infusions:  sodium chloride Stopped (05/16/23 1959)   PRN Meds: acetaminophen **OR** acetaminophen, ALPRAZolam, HYDROmorphone (DILAUDID) injection, ondansetron **OR** ondansetron (ZOFRAN) IV, oxyCODONE-acetaminophen, polyethylene glycol   Vital Signs    Vitals:   05/17/23 2344 05/17/23 2350 05/18/23 0500 05/18/23 0819  BP:  130/62 121/76 138/72  Pulse:  80 78 84  Resp: 16 18 18 16   Temp:  97.8 F (36.6 C) 97.8 F (36.6 C)   TempSrc:  Oral Oral   SpO2:  98% 100% 99%  Weight:      Height:        Intake/Output Summary (Last 24 hours) at 05/18/2023 1044 Last data filed at 05/18/2023 1610 Gross per 24 hour  Intake 1030 ml  Output 650 ml  Net 380 ml      05/17/2023    4:35 AM 05/16/2023    9:04 PM 05/13/2023    9:05 PM  Last 3 Weights  Weight (lbs) 197 lb 14.4 oz 200 lb 9.9 oz 200 lb  Weight (kg) 89.767 kg 91 kg 90.719 kg      Telemetry    NSR HR 60-70s - Personally Reviewed  ECG    No new - Personally Reviewed  Physical Exam   GEN: No acute distress.   Neck: No JVD Cardiac: RRR, no murmurs, rubs, or gallops.  Respiratory: Clear to auscultation bilaterally. GI: Soft, nontender, non-distended  MS: No edema; No deformity. Neuro:  Nonfocal  Psych: Normal affect   Labs    High Sensitivity Troponin:   Recent Labs  Lab 04/28/23 1347 04/29/23 0504 05/13/23 2116 05/13/23 2308  05/16/23 1156  TROPONINIHS 234* 23,432* 29* 31* 65*     Chemistry Recent Labs  Lab 05/13/23 2116 05/16/23 1156 05/17/23 0502 05/18/23 0449  NA 129* 136 134* 135  K 4.4 3.9 4.2 4.4  CL 101 103 102 106  CO2 19* 22 23 26   GLUCOSE 162* 232* 126* 122*  BUN 28* 34* 35* 25*  CREATININE 1.10 1.19 1.05 0.99  CALCIUM 9.0 9.5 8.9 8.7*  PROT 7.3 7.0  --   --   ALBUMIN 3.9 3.8  --   --   AST 27 35  --   --   ALT 24 22  --   --   ALKPHOS 101 91  --   --   BILITOT 0.9 0.7  --   --   GFRNONAA >60 >60 >60 >60  ANIONGAP 9 11 9  3*    Lipids No results for input(s): "CHOL", "TRIG", "HDL", "LABVLDL", "LDLCALC", "CHOLHDL" in the last 168 hours.  Hematology Recent Labs  Lab 05/16/23 1156 05/16/23 2259 05/17/23 0502 05/17/23 1752 05/18/23 0449  WBC 6.8  --  6.5  --  5.9  RBC 2.34*  --  2.54*  --  2.55*  HGB  7.5*   < > 7.9* 9.1* 8.1*  HCT 22.9*  --  24.6* 27.6* 24.9*  MCV 97.9  --  96.9  --  97.6  MCH 32.1  --  31.1  --  31.8  MCHC 32.8  --  32.1  --  32.5  RDW 14.1  --  15.1  --  15.6*  PLT 272  --  245  --  203   < > = values in this interval not displayed.   Thyroid No results for input(s): "TSH", "FREET4" in the last 168 hours.  BNP Recent Labs  Lab 05/16/23 1156  BNP 245.5*    DDimer No results for input(s): "DDIMER" in the last 168 hours.   Radiology    US RENAL  Result Date: 05/16/2023 CLINICAL DATA:  Renal colic. EXAM: RENAL / URINARY TRACT ULTRASOUND COMPLETE COMPARISON:  CT renal 05/13/2023. FINDINGS: Right Kidney: Renal measurements: 11.7 x 6.2 x 8 cm = volume: 303 mL. Echogenicity within normal limits. Staghorn calculus measuring up to least 4.5 cm. No mass or hydronephrosis visualized. Left Kidney: Renal measurements: 17.3 x 7.3 x 7.7 cm = volume: 510 mL. Marked renal cortical scarring/thinning. Echogenicity within normal limits. At least 2.1 cm left staghorn calculus. No mass. Persistent severe hydronephrosis visualized. Urinary bladder: Appears normal for degree of  bladder distention. Other: None. IMPRESSION: 1. Persistent severe left hydronephrosis with chronic renal cortical scarring/thinning. 2. Left 2.1 cm staghorn calculus. 3. Nonobstructive right at least 4.5 cm staghorn calculus. 4. Findings better evaluated on CT renal 05/13/2023. Electronically Signed   By: Tish Frederickson M.D.   On: 05/16/2023 18:37   DG Chest 2 View  Result Date: 05/16/2023 CLINICAL DATA:  Shortness of breath. EXAM: CHEST - 2 VIEW COMPARISON:  May 13, 2023. FINDINGS: Stable cardiomediastinal silhouette. Large hiatal hernia is again noted. Both lungs are clear. The visualized skeletal structures are unremarkable. IMPRESSION: No active cardiopulmonary disease.  Large hiatal hernia. Electronically Signed   By: Lupita Raider M.D.   On: 05/16/2023 12:21    Cardiac Studies   LHC 05/01/23   Prox LAD to Mid LAD lesion is 99% stenosed.   Mid LAD lesion is 60% stenosed.   1st Diag lesion is 70% stenosed.   Prox RCA lesion is 95% stenosed.   Mid RCA lesion is 30% stenosed.   RPDA lesion is 60% stenosed.   Dist LAD lesion is 99% stenosed.   A drug-eluting stent was successfully placed using a STENT ONYX FRONTIER [id number].   Balloon angioplasty was performed using a BALLN [place] EUPHORA RX [id number].   Post intervention, there is a 20% residual stenosis.   Post intervention, there is a 0% residual stenosis.   There is severe left ventricular systolic dysfunction.   LV end diastolic pressure is severely elevated.   There is moderate (3+) mitral regurgitation.   1.  Severe two-vessel coronary artery disease with subtotal occlusion of the proximal LAD, diffuse moderate mid LAD disease and severe distal LAD disease.  In addition, there is 95% in the proximal right coronary artery with faint right to left collaterals to the LAD.  The left circumflex has mild nonobstructive disease and OM 3 is large and reaches the apex. 2.  Severely reduced LV systolic function with an EF of 25%.  Severely elevated  left ventricular end-diastolic pressure at 34 mmHg. 3.  Successful balloon angioplasty to the proximal LAD and drug-eluting stent placement to the proximal right coronary artery.   TTE 04/30/23 1. Left  ventricular ejection fraction, by estimation, is 30 to 35%. Left  ventricular ejection fraction by PLAX is 32 %. The left ventricle has  moderately decreased function. The left ventricle demonstrates global  hypokinesis with severe hypokinesis of  the anterior, anteroseptal and apical region. The left ventricular  internal cavity size was mildly dilated. There is mild left ventricular  hypertrophy. Left ventricular diastolic parameters are consistent with  Grade II diastolic dysfunction  (pseudonormalization).   2. Right ventricular systolic function is normal. The right ventricular  size is normal. There is normal pulmonary artery systolic pressure.   3. The mitral valve is normal in structure. Moderate mitral valve  regurgitation. No evidence of mitral stenosis.   4. The aortic valve is normal in structure. Aortic valve regurgitation is  not visualized. No aortic stenosis is present.   5. There is mild dilatation of the ascending aorta, measuring 42 mm.   6. The inferior vena cava is normal in size with <50% respiratory  variability, suggesting right atrial pressure of 8 mmHg.   Patient Profile     67 y.o. male with a hx of kidney stones, coronary artery disease status post recent NSTEMI with PCI to the pLAD and PCI/DES to the RCA (05/01/23), HFrEF (30-35%), ischemic cardiomyopathy, hiatal hernia, and nephrolithiasis, who is being seen 05/17/2023 for the evaluation of hematuria   Assessment & Plan    Symptomatic Anemia with gross hematuria - Hgb 7.5>9.1>8.1 - s/p 2 units - continues to have gross hematuria, but he says it's improving - FOBT negative - daily CBC - urology following  Elevated Troponin Recent NSTEMI/CAD - previous episode of chest discomfort - HS trop 29>65 -  suspect supply demand mismatch in the setting of anemia - s/p PCI/DES 2 weeks ago to the pLAD PCI and PCI/DES to RCA 05/01/2023 - Brilnta discontinued and started on Plavix - continue ASA 81mg  daily - continue statin therapy - Keep Hgb >8  HFrEF ICM - SOB the last 2 weeks suspected from symptomatic anemia - LVEF 30-35% on previous admission - continue Toprol 12.5mg  daily - no SGLT21 due to UTI and hematuria - start Losartan 12.5mg  daily - continue optimization of GDMT as able  Hydronephrosis of the left kidney/staghorn calculus - Urology saw and plan for outpatient follow-up with no urgent stent placement  For questions or updates, please contact [facility] Please consult [email or web address] for contact info under        Signed, Cadence David Stall, PA-C  05/18/2023, 10:44 AM       I have seen and examined the patient. I agree with the above note with the addition of : He reports no chest pain since recent PCI.  He does have mild exertional dyspnea but overall much better than when he presented with non-STEMI and acute systolic heart failure. Hematuria is persistent but seems to be improving.  By physical exam, heart is regular with no murmurs.  Lungs are clear to auscultation with no significant leg edema.  Recommendations: Symptomatic anemia with gross hematuria requiring transfusion in the setting of treatment with dual antiplatelet therapy after recent PCI. Brilinta was switched to clopidogrel.  I think we should continue with low-dose aspirin and clopidogrel at this time as the risk of stent thrombosis is too high with interruption.  Chronic systolic heart failure: Continue Toprol and small dose losartan.  Medications are limited by low blood pressure.  Coronary artery disease: Status post recent drug-eluting stent placement to proximal RCA and balloon angioplasty of the  proximal LAD.  No stent was placed in the LAD due to diffuse disease distally and bifurcation stenosis  with a large diagonal branch.  At some point, he will require relook angiography in the outpatient setting but likely not able to do in the near future considering his bleeding issues.  Lorine Bears MD, [facility] 05/18/2023 3:51 PM

## 2023-05-19 ENCOUNTER — Encounter: Payer: Medicare HMO | Admitting: Family

## 2023-05-19 DIAGNOSIS — D649 Anemia, unspecified: Secondary | ICD-10-CM | POA: Diagnosis not present

## 2023-05-19 DIAGNOSIS — R310 Gross hematuria: Secondary | ICD-10-CM | POA: Diagnosis not present

## 2023-05-19 DIAGNOSIS — I5022 Chronic systolic (congestive) heart failure: Secondary | ICD-10-CM | POA: Diagnosis not present

## 2023-05-19 DIAGNOSIS — I214 Non-ST elevation (NSTEMI) myocardial infarction: Secondary | ICD-10-CM

## 2023-05-19 LAB — TYPE AND SCREEN
ABO/RH(D): A POS
Unit division: 0
Unit division: 0

## 2023-05-19 LAB — BASIC METABOLIC PANEL
Anion gap: 8 (ref 5–15)
BUN: 19 mg/dL (ref 8–23)
CO2: 25 mmol/L (ref 22–32)
Calcium: 8.9 mg/dL (ref 8.9–10.3)
Chloride: 105 mmol/L (ref 98–111)
Creatinine, Ser: 0.93 mg/dL (ref 0.61–1.24)
GFR, Estimated: >60 mL/min (ref >60)
Glucose, Bld: 100 mg/dL — ABNORMAL HIGH (ref 70–99)
Potassium: 4.3 mmol/L (ref 3.5–5.1)
Sodium: 138 mmol/L (ref 135–145)

## 2023-05-19 LAB — CBC
HCT: 28.8 % — ABNORMAL LOW (ref 39.0–52.0)
Hemoglobin: 9.6 g/dL — ABNORMAL LOW (ref 13.0–17.0)
MCH: 30.9 pg (ref 26.0–34.0)
MCHC: 33.3 g/dL (ref 30.0–36.0)
MCV: 92.6 fL (ref 80.0–100.0)
Platelets: 221 10*3/uL (ref 150–400)
RBC: 3.11 MIL/uL — ABNORMAL LOW (ref 4.22–5.81)
RDW: 16.9 % — ABNORMAL HIGH (ref 11.5–15.5)
WBC: 6.5 10*3/uL (ref 4.0–10.5)
nRBC: 0 % (ref 0.0–0.2)

## 2023-05-19 LAB — VITAMIN B12: Vitamin B-12: 614 pg/mL (ref 180–914)

## 2023-05-19 LAB — BPAM RBC
Blood Product Expiration Date: 202407092359
ISSUE DATE / TIME: 202406271449
Unit Type and Rh: 600
Unit Type and Rh: 600

## 2023-05-19 LAB — IRON AND TIBC
Iron: 194 ug/dL — ABNORMAL HIGH (ref 45–182)
Saturation Ratios: 46 % — ABNORMAL HIGH (ref 17.9–39.5)
TIBC: 424 ug/dL (ref 250–450)
UIBC: 230 ug/dL

## 2023-05-19 LAB — GLUCOSE, CAPILLARY: Glucose-Capillary: 99 mg/dL (ref 70–99)

## 2023-05-19 LAB — FOLATE: Folate: >40 ng/mL (ref >5.9)

## 2023-05-19 NOTE — Plan of Care (Signed)
  Problem: Education: Goal: Understanding of CV disease, CV risk reduction, and recovery process will improve Outcome: Progressing   Problem: Activity: Goal: Ability to return to baseline activity level will improve Outcome: Progressing   

## 2023-05-19 NOTE — Progress Notes (Signed)
   Patient Name: Ronnie Mann Date of Encounter: 05/19/2023 [facility] Cardiologist: Julien Nordmann, MD   Interval Summary  .    No chest pain or shortness of breath.  Mild flank and suprapubic pain still present.  Gross hematuria continues.  Vital Signs .    Vitals:   05/18/23 2333 05/19/23 0340 05/19/23 0723 05/19/23 1340  BP: 113/70 130/73 127/72 127/76  Pulse: 78 77 78 76  Resp: 19 19 18 18   Temp: 98 F (36.7 C) 98.1 F (36.7 C) 98.4 F (36.9 C) (!) 97.5 F (36.4 C)  TempSrc: Oral Oral    SpO2: 100% 100% 100% 99%  Weight:  91.1 kg    Height:        Intake/Output Summary (Last 24 hours) at 05/19/2023 1607 Last data filed at 05/19/2023 1433 Gross per 24 hour  Intake 1067.92 ml  Output 400 ml  Net 667.92 ml      05/19/2023    3:40 AM 05/17/2023    4:35 AM 05/16/2023    9:04 PM  Last 3 Weights  Weight (lbs) 200 lb 13.4 oz 197 lb 14.4 oz 200 lb 9.9 oz  Weight (kg) 91.1 kg 89.767 kg 91 kg      Telemetry/ECG    Normal sinus rhythm with PACs and PVCs.- Personally Reviewed  Physical Exam .   GEN: No acute distress.   Neck: No JVD Cardiac: RRR, no murmurs, rubs, or gallops.  Respiratory: Mildly diminished breath sounds throughout without wheezes or crackles. GI: Soft, nontender, non-distended  MS: No edema  Assessment & Plan .     Coronary artery disease and recent NSTEMI: No angina reported.  Given NSTEMI with PTCA of LAD and stenting of RCA less than a month ago, dual antiplatelet therapy needs to be continued for at least a month from the intervention.  We will plan to continue aspirin and clopidogrel for now.  Continue secondary prevention with atorvastatin and low-dose metoprolol.  Continue to trend hemoglobin with PRBC transfusion for symptoms or hemoglobin less than 8.  Chronic HFrEF: Mr. Chilton Si appears euvolemic on exam and is without symptoms of decompensated heart failure.  Continue current doses of losartan and metoprolol.  Judicious escalation  of these medications can be considered as blood pressure allows.  Gross hematuria and symptomatic anemia: Unfortunately, gross hematuria persists with intermittent flank and suprapubic pain.  Urology has recommended close outpatient follow-up and intervention if necessary, though if hematuria persists and Mr. Chilton Si requires additional PRBC transfusions, I think inpatient intervention to control bleeding will need to be entertained again.  For questions or updates, please contact  [facility] Please consult [email or web address] for contact info under [facility].     Signed, Yvonne Kendall, MD

## 2023-05-19 NOTE — Care Management Important Message (Signed)
Important Message  Patient Details  Name: Ronnie Mann MRN: [id number] Date of Birth: 01/16/1956   Medicare Important Message Given:  N/A - LOS <3 / Initial given by admissions     [name] 05/19/2023, 8:48 AM

## 2023-05-19 NOTE — Progress Notes (Signed)
[facility] Progress Note  Patient: Ronnie Mann    ZOX:[id number]  DOA: 05/16/2023     Date of Service: the patient was seen and examined on 05/19/2023  Chief Complaint  Patient presents with   Shortness of Breath   Brief hospital course: AXETON MURE is a 67 y.o. male with medical history significant of recent NSTEMI, s/p PCI to proximal LAD and RCA on 05/01/23, HFrEF(EF 30 to 35%), and nephrolithiasis presented to ED with shortness of breath, bilateral flank pain radiating towards lower abdomen, more on right than left, dysuria and gross hematuria.   Patient with history of bilateral nephrolithiasis, symptoms consistent with symptomatic anemia and renal colic.  Hemoglobin 7.5.   Patient did received a dose of ceftriaxone in ED which was not continued.  Less likely UTI.   Urology was consulted and 2 unit of PRBC ordered.   Assessment and Plan:  Gross hematuria Symptomatic anemia due to acute blood loss/hematuria Iron profile, folate and B12 within normal range Patient with history of chronic nephrolithiasis and most recent CT abdomen showing staghorn and obstructing stones.  Symptoms are  consistent with renal colic.  Recently placed on DAPT with aspirin and Brilinta.   -Urology consult-recommending close outpatient follow-up for possible cystoscopy and ureteral stent placement. -s/p Total 3 units of PRBC transfusion -keep hemoglobin above 8 with recent NSTEMI. -Renal ultrasound-with persistent severe left hydronephrosis with chronic renal cortical scarring/thinning.  Nonobstructive right staghorn calculus -Pain management Follow bladder scan to rule out urinary retention Hb 9.6 today  Staghorn calculus Patient was found to have right-sided staghorn calculus and left-sided hydronephrosis with a obstructing stone on most recent CT renal stone study done on 05/13/2023.  Might be contributory to gross hematuria.  Renal ultrasound confirmed. -Urology consult-recommending close  outpatient follow-up.  Will be high risk for definitive treatment of this type of stones due to being on DAPT and recent NSTEMI   Hydronephrosis of left kidney Patient has a chronic stable left renal hydronephrosis with marked renal cortical thinning due to an obstructing proximal left ureteral calculus. -Urology was consulted   Elevated troponin Patient with history of recent NSTEMI, s/p cardiac catheterization and PCI to proximal LAD and RCA on 05/01/2023. Slowly rising troponin 29>>65 -Cardiology was consulted and they switched Brilinta with Plavix, would like to continue DAPT due to recent stent placement unless there is a life-threatening bleeding. -Keep hemoglobin above 8   Ischemic cardiomyopathy Recent diagnosis of acute systolic heart failure with EF of 30 to 35% with NSTEMI.  S/p cardiac cath and PCI to LAD and RCA. -Cardiology consult -switched Brilinta with Plavix. -Continue statin and carvedilol -Daily weight and BMP -Strict intake and output   Acute hyperglycemia Patient was found to have blood glucose above 200, also noted to have elevated blood glucose level during most recent admissions and A1c was checked and it was 5.5. Patient was placed on SGL 2 inhibitor on discharge. -Continue to monitor   UTI symptoms UA with bacteriuria.  Patient has bilateral renal colic.  Recent UA was also positive for pyuria and bacteriuria but urine cultures were sterile. No leukocytosis.  Received a dose of ceftriaxone in ED and urine cultures were sent.  Repeat  urine cultures with no growth -No need for antibiotics   Body mass index is 29.66 kg/m.  Interventions:  Diet: Heart healthy diet DVT Prophylaxis: SCD, pharmacological prophylaxis contraindicated due to hematuria    Advance goals of care discussion: Full code  Family Communication: family was  not present at bedside, at the time of interview.  The pt provided permission to discuss medical plan with the family. Opportunity  was given to ask question and all questions were answered satisfactorily.   Disposition:  Pt is from home, admitted with gross hematuria, still has hematuria, which precludes a safe discharge. Discharge to home, when clinically stable.  Subjective: No significant events overnight, patient still has hematuria, intermittent mild pain during voiding.  Denies any abdominal pain, no chest pain or palpitation, no any other active issues.  Physical Exam: General: NAD, lying comfortably Appear in no distress, affect appropriate Eyes: PERRLA ENT: Oral Mucosa Clear, moist  Neck: no JVD,  Cardiovascular: S1 and S2 Present, no Murmur,  Respiratory: good respiratory effort, Bilateral Air entry equal and Decreased, no Crackles, no wheezes Abdomen: Bowel Sound present, Soft and no tenderness,  Skin: no rashes Extremities: no Pedal edema, no calf tenderness Neurologic: without any new focal findings Gait not checked due to patient safety concerns  Vitals:   05/18/23 2333 05/19/23 0340 05/19/23 0723 05/19/23 1340  BP: 113/70 130/73 127/72 127/76  Pulse: 78 77 78 76  Resp: 19 19 18 18   Temp: 98 F (36.7 C) 98.1 F (36.7 C) 98.4 F (36.9 C) (!) 97.5 F (36.4 C)  TempSrc: Oral Oral    SpO2: 100% 100% 100% 99%  Weight:  91.1 kg    Height:        Intake/Output Summary (Last 24 hours) at 05/19/2023 1508 Last data filed at 05/19/2023 1433 Gross per 24 hour  Intake 1067.92 ml  Output 575 ml  Net 492.92 ml   Filed Weights   05/16/23 2104 05/17/23 0435 05/19/23 0340  Weight: 91 kg 89.8 kg 91.1 kg    Data Reviewed: I have personally reviewed and interpreted daily labs, tele strips, imagings as discussed above. I reviewed all nursing notes, pharmacy notes, vitals, pertinent old records I have discussed plan of care as described above with RN and patient/family.  CBC: Recent Labs  Lab 05/13/23 2116 05/16/23 1156 05/16/23 2259 05/17/23 0502 05/17/23 1752 05/18/23 0449 05/18/23 1904  05/19/23 0638  WBC 8.5 6.8  --  6.5  --  5.9  --  6.5  NEUTROABS  --  5.5  --   --   --   --   --   --   HGB 8.5* 7.5*   < > 7.9* 9.1* 8.1* 9.1* 9.6*  HCT 26.0* 22.9*  --  24.6* 27.6* 24.9* 28.0* 28.8*  MCV 97.7 97.9  --  96.9  --  97.6  --  92.6  PLT 288 272  --  245  --  203  --  221   < > = values in this interval not displayed.   Basic Metabolic Panel: Recent Labs  Lab 05/13/23 2116 05/16/23 1156 05/17/23 0502 05/18/23 0449 05/19/23 0638  NA 129* 136 134* 135 138  K 4.4 3.9 4.2 4.4 4.3  CL 101 103 102 106 105  CO2 19* 22 23 26 25   GLUCOSE 162* 232* 126* 122* 100*  BUN 28* 34* 35* 25* 19  CREATININE 1.10 1.19 1.05 0.99 0.93  CALCIUM 9.0 9.5 8.9 8.7* 8.9    Studies: No results found.  Scheduled Meds:  aspirin EC  81 mg Oral Daily   atorvastatin  40 mg Oral Daily   clopidogrel  75 mg Oral Daily   Fe Fum-Vit C-Vit B12-FA  1 capsule Oral BID   losartan  12.5 mg Oral Daily  melatonin  10 mg Oral QHS   metoprolol succinate  12.5 mg Oral Daily   tamsulosin  0.4 mg Oral QPC breakfast   Continuous Infusions:  sodium chloride Stopped (05/16/23 1959)   PRN Meds: acetaminophen **OR** acetaminophen, ALPRAZolam, HYDROmorphone (DILAUDID) injection, ondansetron **OR** ondansetron (ZOFRAN) IV, oxyCODONE-acetaminophen, polyethylene glycol  Time spent: 35 minutes  Author: Gillis Santa. MD [facility] 05/19/2023 3:08 PM  To reach On-call, see care teams to locate the attending and reach out to them via [email or web address]. If 7PM-7AM, please contact night-coverage If you still have difficulty reaching the attending provider, please page the [facility] (Director on Call) for [facility] on amion for assistance.

## 2023-05-19 NOTE — Plan of Care (Signed)
  Problem: Education: Goal: Understanding of CV disease, CV risk reduction, and recovery process will improve Outcome: Progressing   Problem: Activity: Goal: Ability to return to baseline activity level will improve Outcome: Progressing   Problem: Elimination: Goal: Will not experience complications related to urinary retention Outcome: Progressing   Problem: Pain Managment: Goal: General experience of comfort will improve Outcome: Progressing   Problem: Safety: Goal: Ability to remain free from injury will improve Outcome: Progressing

## 2023-05-20 DIAGNOSIS — I429 Cardiomyopathy, unspecified: Secondary | ICD-10-CM

## 2023-05-20 DIAGNOSIS — R310 Gross hematuria: Secondary | ICD-10-CM | POA: Diagnosis not present

## 2023-05-20 DIAGNOSIS — I2510 Atherosclerotic heart disease of native coronary artery without angina pectoris: Secondary | ICD-10-CM | POA: Diagnosis not present

## 2023-05-20 LAB — CBC
HCT: 27.5 % — ABNORMAL LOW (ref 39.0–52.0)
Hemoglobin: 8.8 g/dL — ABNORMAL LOW (ref 13.0–17.0)
MCH: 30.1 pg (ref 26.0–34.0)
MCHC: 32 g/dL (ref 30.0–36.0)
MCV: 94.2 fL (ref 80.0–100.0)
Platelets: 207 10*3/uL (ref 150–400)
RBC: 2.92 MIL/uL — ABNORMAL LOW (ref 4.22–5.81)
RDW: 16 % — ABNORMAL HIGH (ref 11.5–15.5)
WBC: 6.7 10*3/uL (ref 4.0–10.5)
nRBC: 0 % (ref 0.0–0.2)

## 2023-05-20 NOTE — Plan of Care (Signed)
  Problem: Education: Goal: Understanding of CV disease, CV risk reduction, and recovery process will improve Outcome: Progressing Goal: Individualized Educational Video(s) Outcome: Progressing   Problem: Activity: Goal: Ability to return to baseline activity level will improve Outcome: Progressing   Problem: Cardiovascular: Goal: Ability to achieve and maintain adequate cardiovascular perfusion will improve Outcome: Progressing Goal: Vascular access site(s) Level 0-1 will be maintained Outcome: Progressing   Problem: Health Behavior/Discharge Planning: Goal: Ability to safely manage health-related needs after discharge will improve Outcome: Progressing   Problem: Education: Goal: Ability to demonstrate management of disease process will improve Outcome: Progressing Goal: Ability to verbalize understanding of medication therapies will improve Outcome: Progressing Goal: Individualized Educational Video(s) Outcome: Progressing   Problem: Activity: Goal: Capacity to carry out activities will improve Outcome: Progressing   Problem: Cardiac: Goal: Ability to achieve and maintain adequate cardiopulmonary perfusion will improve Outcome: Progressing   Problem: Education: Goal: Knowledge of General Education information will improve Description: Including pain rating scale, medication(s)/side effects and non-pharmacologic comfort measures Outcome: Progressing   Problem: Health Behavior/Discharge Planning: Goal: Ability to manage health-related needs will improve Outcome: Progressing   Problem: Clinical Measurements: Goal: Ability to maintain clinical measurements within normal limits will improve Outcome: Progressing Goal: Will remain free from infection Outcome: Progressing Goal: Diagnostic test results will improve Outcome: Progressing Goal: Respiratory complications will improve Outcome: Progressing Goal: Cardiovascular complication will be avoided Outcome:  Progressing   Problem: Activity: Goal: Risk for activity intolerance will decrease Outcome: Progressing   Problem: Nutrition: Goal: Adequate nutrition will be maintained Outcome: Progressing   Problem: Coping: Goal: Level of anxiety will decrease Outcome: Progressing   Problem: Elimination: Goal: Will not experience complications related to bowel motility Outcome: Progressing Goal: Will not experience complications related to urinary retention Outcome: Progressing   Problem: Pain Managment: Goal: General experience of comfort will improve Outcome: Progressing   Problem: Safety: Goal: Ability to remain free from injury will improve Outcome: Progressing   Problem: Skin Integrity: Goal: Risk for impaired skin integrity will decrease Outcome: Progressing   

## 2023-05-20 NOTE — Progress Notes (Signed)
Rounding Note    Patient Name: Ronnie Mann Date of Encounter: 05/20/2023  [facility] Cardiologist: Julien Nordmann, MD   Subjective   Hgb 8.8. The patient reports unchanged hematuria. He denies chest pain or SOB.   Inpatient Medications    Scheduled Meds:  aspirin EC  81 mg Oral Daily   atorvastatin  40 mg Oral Daily   clopidogrel  75 mg Oral Daily   Fe Fum-Vit C-Vit B12-FA  1 capsule Oral BID   losartan  12.5 mg Oral Daily   melatonin  10 mg Oral QHS   metoprolol succinate  12.5 mg Oral Daily   tamsulosin  0.4 mg Oral QPC breakfast   Continuous Infusions:  sodium chloride Stopped (05/16/23 1959)   PRN Meds: acetaminophen **OR** acetaminophen, ALPRAZolam, HYDROmorphone (DILAUDID) injection, ondansetron **OR** ondansetron (ZOFRAN) IV, oxyCODONE-acetaminophen, polyethylene glycol   Vital Signs    Vitals:   05/19/23 2009 05/20/23 0007 05/20/23 0227 05/20/23 0420  BP: 121/76 (!) 113/59 135/78 108/60  Pulse: 79 75 81 74  Resp: 17 15 20 19   Temp: 98.2 F (36.8 C) 98.3 F (36.8 C)  97.8 F (36.6 C)  TempSrc: Oral Oral  Oral  SpO2: 100% 100% 99% 100%  Weight:    88.8 kg  Height:        Intake/Output Summary (Last 24 hours) at 05/20/2023 0719 Last data filed at 05/20/2023 0500 Gross per 24 hour  Intake 840 ml  Output 1750 ml  Net -910 ml      05/20/2023    4:20 AM 05/19/2023    3:40 AM 05/17/2023    4:35 AM  Last 3 Weights  Weight (lbs) 195 lb 12.8 oz 200 lb 13.4 oz 197 lb 14.4 oz  Weight (kg) 88.814 kg 91.1 kg 89.767 kg      Telemetry    NSR HR 70-80s, PVCs - Personally Reviewed  ECG    No new - Personally Reviewed  Physical Exam   GEN: No acute distress.   Neck: No JVD Cardiac: RRR, no murmurs, rubs, or gallops.  Respiratory: Clear to auscultation bilaterally. GI: Soft, nontender, non-distended  MS: No edema; No deformity. Neuro:  Nonfocal  Psych: Normal affect   Labs    High Sensitivity Troponin:   Recent Labs  Lab  04/28/23 1347 04/29/23 0504 05/13/23 2116 05/13/23 2308 05/16/23 1156  TROPONINIHS 234* 23,432* 29* 31* 65*     Chemistry Recent Labs  Lab 05/13/23 2116 05/16/23 1156 05/17/23 0502 05/18/23 0449 05/19/23 0638  NA 129* 136 134* 135 138  K 4.4 3.9 4.2 4.4 4.3  CL 101 103 102 106 105  CO2 19* 22 23 26 25   GLUCOSE 162* 232* 126* 122* 100*  BUN 28* 34* 35* 25* 19  CREATININE 1.10 1.19 1.05 0.99 0.93  CALCIUM 9.0 9.5 8.9 8.7* 8.9  PROT 7.3 7.0  --   --   --   ALBUMIN 3.9 3.8  --   --   --   AST 27 35  --   --   --   ALT 24 22  --   --   --   ALKPHOS 101 91  --   --   --   BILITOT 0.9 0.7  --   --   --   GFRNONAA >60 >60 >60 >60 >60  ANIONGAP 9 11 9  3* 8    Lipids No results for input(s): "CHOL", "TRIG", "HDL", "LABVLDL", "LDLCALC", "CHOLHDL" in the last 168 hours.  Hematology  Recent Labs  Lab 05/18/23 0449 05/18/23 1904 05/19/23 0638 05/20/23 0629  WBC 5.9  --  6.5 6.7  RBC 2.55*  --  3.11* 2.92*  HGB 8.1* 9.1* 9.6* 8.8*  HCT 24.9* 28.0* 28.8* 27.5*  MCV 97.6  --  92.6 94.2  MCH 31.8  --  30.9 30.1  MCHC 32.5  --  33.3 32.0  RDW 15.6*  --  16.9* 16.0*  PLT 203  --  221 207   Thyroid No results for input(s): "TSH", "FREET4" in the last 168 hours.  BNP Recent Labs  Lab 05/16/23 1156  BNP 245.5*    DDimer No results for input(s): "DDIMER" in the last 168 hours.   Radiology    No results found.  Cardiac Studies   LHC 05/01/23   Prox LAD to Mid LAD lesion is 99% stenosed.   Mid LAD lesion is 60% stenosed.   1st Diag lesion is 70% stenosed.   Prox RCA lesion is 95% stenosed.   Mid RCA lesion is 30% stenosed.   RPDA lesion is 60% stenosed.   Dist LAD lesion is 99% stenosed.   A drug-eluting stent was successfully placed using a STENT ONYX FRONTIER [id number].   Balloon angioplasty was performed using a BALLN [place] EUPHORA RX 2.75X20.   Post intervention, there is a 20% residual stenosis.   Post intervention, there is a 0% residual stenosis.   There is severe  left ventricular systolic dysfunction.   LV end diastolic pressure is severely elevated.   There is moderate (3+) mitral regurgitation.   1.  Severe two-vessel coronary artery disease with subtotal occlusion of the proximal LAD, diffuse moderate mid LAD disease and severe distal LAD disease.  In addition, there is 95% in the proximal right coronary artery with faint right to left collaterals to the LAD.  The left circumflex has mild nonobstructive disease and OM 3 is large and reaches the apex. 2.  Severely reduced LV systolic function with an EF of 25%.  Severely elevated left ventricular end-diastolic pressure at 34 mmHg. 3.  Successful balloon angioplasty to the proximal LAD and drug-eluting stent placement to the proximal right coronary artery.   TTE 04/30/23 1. Left ventricular ejection fraction, by estimation, is 30 to 35%. Left  ventricular ejection fraction by PLAX is 32 %. The left ventricle has  moderately decreased function. The left ventricle demonstrates global  hypokinesis with severe hypokinesis of  the anterior, anteroseptal and apical region. The left ventricular  internal cavity size was mildly dilated. There is mild left ventricular  hypertrophy. Left ventricular diastolic parameters are consistent with  Grade II diastolic dysfunction  (pseudonormalization).   2. Right ventricular systolic function is normal. The right ventricular  size is normal. There is normal pulmonary artery systolic pressure.   3. The mitral valve is normal in structure. Moderate mitral valve  regurgitation. No evidence of mitral stenosis.   4. The aortic valve is normal in structure. Aortic valve regurgitation is  not visualized. No aortic stenosis is present.   5. There is mild dilatation of the ascending aorta, measuring 42 mm.   6. The inferior vena cava is normal in size with <50% respiratory  variability, suggesting right atrial pressure of 8 mmHg.   Patient Profile     67 y.o. male  with a  hx of kidney stones, coronary artery disease status post recent NSTEMI with PCI to the pLAD and PCI/DES to the RCA (05/01/23), HFrEF (30-35%), ischemic cardiomyopathy, hiatal hernia, and  nephrolithiasis, who is being seen 05/17/2023 for the evaluation of hematuria   Assessment & Plan    Symptomatic Anemia with gross hematuria - Hgb 7.5>>>9.6>8.8 - s/p 3 units - continues to have gross hematuria - FOBT negative - daily CBC - urology following   Elevated Troponin Recent NSTEMI/CAD - previous episode of chest discomfort - HS trop 29>65 - suspect supply demand mismatch in the setting of anemia - s/p PCI/DES 2 weeks ago to the pLAD PCI and PCI/DES to RCA 05/01/2023 - Brilnta discontinued and started on Plavix - continue ASA 81mg  daily, Plavix, Toprol and statin therapy - Keep Hgb >8   HFrEF ICM - SOB the last 2 weeks suspected from symptomatic anemia - LVEF 30-35% on previous admission - continue Toprol 12.5mg  daily and Losartan 12.5mg  daily - no SGLT21 due to UTI and hematuria - continue optimization of GDMT as able   Hydronephrosis of the left kidney/staghorn calculus - Urology saw and plan for outpatient follow-up with no urgent stent placement  For questions or updates, please contact [facility] Please consult [email or web address] for contact info under        Signed, Garyn Arlotta David Stall, PA-C  05/20/2023, 7:19 AM

## 2023-05-20 NOTE — Progress Notes (Signed)
[facility] Progress Note  Patient: Ronnie Mann    QIO:[id number]  DOA: 05/16/2023     Date of Service: the patient was seen and examined on 05/20/2023  Chief Complaint  Patient presents with   Shortness of Breath   Brief hospital course: Ronnie Mann is a 67 y.o. male with medical history significant of recent NSTEMI, s/p PCI to proximal LAD and RCA on 05/01/23, HFrEF(EF 30 to 35%), and nephrolithiasis presented to ED with shortness of breath, bilateral flank pain radiating towards lower abdomen, more on right than left, dysuria and gross hematuria.   Patient with history of bilateral nephrolithiasis, symptoms consistent with symptomatic anemia and renal colic.  Hemoglobin 7.5.   Patient did received a dose of ceftriaxone in ED which was not continued.  Less likely UTI.   Urology was consulted and 2 unit of PRBC ordered.   Assessment and Plan:  Gross hematuria Symptomatic anemia due to acute blood loss/hematuria Iron profile, folate and B12 within normal range Patient with history of chronic nephrolithiasis and most recent CT abdomen showing staghorn and obstructing stones.  Symptoms are  consistent with renal colic.  Recently placed on DAPT with aspirin and Brilinta.   -Urology consult-recommending close outpatient follow-up for possible cystoscopy and ureteral stent placement. -s/p Total 3 units of PRBC transfusion -keep hemoglobin above 8 with recent NSTEMI. -Renal ultrasound-with persistent severe left hydronephrosis with chronic renal cortical scarring/thinning.  Nonobstructive right staghorn calculus -Pain management Bladder scan to rule out urinary retention Hb 8.8 today (goal 8.0)  Staghorn calculus Patient was found to have right-sided staghorn calculus and left-sided hydronephrosis with a obstructing stone on most recent CT renal stone study done on 05/13/2023.  Might be contributory to gross hematuria.  Renal ultrasound confirmed. -Urology consult-recommending close  outpatient follow-up.  Will be high risk for definitive treatment of this type of stones due to being on DAPT and recent NSTEMI   Hydronephrosis of left kidney Patient has a chronic stable left renal hydronephrosis with marked renal cortical thinning due to an obstructing proximal left ureteral calculus. -Urology was consulted   Elevated troponin Patient with history of recent NSTEMI, s/p cardiac catheterization and PCI to proximal LAD and RCA on 05/01/2023. Slowly rising troponin 29>>65 -Cardiology was consulted and they switched Brilinta with Plavix, would like to continue DAPT due to recent stent placement unless there is a life-threatening bleeding. -Keep hemoglobin above 8   Ischemic cardiomyopathy Recent diagnosis of acute systolic heart failure with EF of 30 to 35% with NSTEMI.  S/p cardiac cath and PCI to LAD and RCA. -Cardiology consult -switched Brilinta with Plavix. -Continue statin and carvedilol -Daily weight and BMP -Strict intake and output   Acute hyperglycemia Patient was found to have blood glucose above 200, also noted to have elevated blood glucose level during most recent admissions and A1c was checked and it was 5.5. Patient was placed on SGL 2 inhibitor on discharge. -Continue to monitor   UTI symptoms UA with bacteriuria.  Patient has bilateral renal colic.  Recent UA was also positive for pyuria and bacteriuria but urine cultures were sterile. No leukocytosis.  Received a dose of ceftriaxone in ED and urine cultures were sent.  Repeat  urine cultures with no growth -No need for antibiotics   Body mass index is 28.91 kg/m.  Interventions:  Diet: Heart healthy diet DVT Prophylaxis: SCD, pharmacological prophylaxis contraindicated due to hematuria    Advance goals of care discussion: Full code  Family Communication: family  was not present at bedside, at the time of interview.  The pt provided permission to discuss medical plan with the family. Opportunity  was given to ask question and all questions were answered satisfactorily.   Disposition:  Pt is from home, admitted with gross hematuria, still has hematuria, which precludes a safe discharge. Discharge to home, when clinically stable.  Subjective: No significant events overnight, patient still has hematuria, and dysuria which is controlled with pain medications.  Denies any chest pain or palpitation, no shortness of breath.     Physical Exam: General: NAD, lying comfortably Appear in no distress, affect appropriate Eyes: PERRLA ENT: Oral Mucosa Clear, moist  Neck: no JVD,  Cardiovascular: S1 and S2 Present, no Murmur,  Respiratory: good respiratory effort, Bilateral Air entry equal and Decreased, no Crackles, no wheezes Abdomen: Bowel Sound present, Soft and no tenderness,  Skin: no rashes Extremities: no Pedal edema, no calf tenderness Neurologic: without any new focal findings Gait not checked due to patient safety concerns  Vitals:   05/20/23 0227 05/20/23 0420 05/20/23 0903 05/20/23 1107  BP: 135/78 108/60 (!) 117/56 125/74  Pulse: 81 74 80 76  Resp: 20 19 15 15   Temp:  97.8 F (36.6 C) 97.7 F (36.5 C) 97.7 F (36.5 C)  TempSrc:  Oral    SpO2: 99% 100% 98% 97%  Weight:  88.8 kg    Height:        Intake/Output Summary (Last 24 hours) at 05/20/2023 1254 Last data filed at 05/20/2023 0500 Gross per 24 hour  Intake 600 ml  Output 1750 ml  Net -1150 ml   Filed Weights   05/17/23 0435 05/19/23 0340 05/20/23 0420  Weight: 89.8 kg 91.1 kg 88.8 kg    Data Reviewed: I have personally reviewed and interpreted daily labs, tele strips, imagings as discussed above. I reviewed all nursing notes, pharmacy notes, vitals, pertinent old records I have discussed plan of care as described above with RN and patient/family.  CBC: Recent Labs  Lab 05/16/23 1156 05/16/23 2259 05/17/23 0502 05/17/23 1752 05/18/23 0449 05/18/23 1904 05/19/23 0638 05/20/23 0629  WBC 6.8  --   6.5  --  5.9  --  6.5 6.7  NEUTROABS 5.5  --   --   --   --   --   --   --   HGB 7.5*   < > 7.9* 9.1* 8.1* 9.1* 9.6* 8.8*  HCT 22.9*  --  24.6* 27.6* 24.9* 28.0* 28.8* 27.5*  MCV 97.9  --  96.9  --  97.6  --  92.6 94.2  PLT 272  --  245  --  203  --  221 207   < > = values in this interval not displayed.   Basic Metabolic Panel: Recent Labs  Lab 05/13/23 2116 05/16/23 1156 05/17/23 0502 05/18/23 0449 05/19/23 0638  NA 129* 136 134* 135 138  K 4.4 3.9 4.2 4.4 4.3  CL 101 103 102 106 105  CO2 19* 22 23 26 25   GLUCOSE 162* 232* 126* 122* 100*  BUN 28* 34* 35* 25* 19  CREATININE 1.10 1.19 1.05 0.99 0.93  CALCIUM 9.0 9.5 8.9 8.7* 8.9    Studies: No results found.  Scheduled Meds:  aspirin EC  81 mg Oral Daily   atorvastatin  40 mg Oral Daily   clopidogrel  75 mg Oral Daily   Fe Fum-Vit C-Vit B12-FA  1 capsule Oral BID   losartan  12.5 mg Oral Daily  melatonin  10 mg Oral QHS   metoprolol succinate  12.5 mg Oral Daily   tamsulosin  0.4 mg Oral QPC breakfast   Continuous Infusions:  sodium chloride Stopped (05/16/23 1959)   PRN Meds: acetaminophen **OR** acetaminophen, ALPRAZolam, HYDROmorphone (DILAUDID) injection, ondansetron **OR** ondansetron (ZOFRAN) IV, oxyCODONE-acetaminophen, polyethylene glycol  Time spent: 35 minutes  Author: Gillis Santa. MD [facility] 05/20/2023 12:54 PM  To reach On-call, see care teams to locate the attending and reach out to them via [email or web address]. If 7PM-7AM, please contact night-coverage If you still have difficulty reaching the attending provider, please page the [facility] (Director on Call) for [facility] on amion for assistance.

## 2023-05-21 ENCOUNTER — Inpatient Hospital Stay: Payer: Medicare HMO

## 2023-05-21 DIAGNOSIS — R310 Gross hematuria: Secondary | ICD-10-CM | POA: Diagnosis not present

## 2023-05-21 DIAGNOSIS — I429 Cardiomyopathy, unspecified: Secondary | ICD-10-CM | POA: Diagnosis not present

## 2023-05-21 DIAGNOSIS — I2510 Atherosclerotic heart disease of native coronary artery without angina pectoris: Secondary | ICD-10-CM | POA: Diagnosis not present

## 2023-05-21 LAB — CBC
HCT: 28.5 % — ABNORMAL LOW (ref 39.0–52.0)
Hemoglobin: 9.3 g/dL — ABNORMAL LOW (ref 13.0–17.0)
MCH: 31 pg (ref 26.0–34.0)
MCHC: 32.6 g/dL (ref 30.0–36.0)
MCV: 95 fL (ref 80.0–100.0)
Platelets: 211 10*3/uL (ref 150–400)
RBC: 3 MIL/uL — ABNORMAL LOW (ref 4.22–5.81)
RDW: 15.8 % — ABNORMAL HIGH (ref 11.5–15.5)
WBC: 7.2 10*3/uL (ref 4.0–10.5)
nRBC: 0 % (ref 0.0–0.2)

## 2023-05-21 LAB — GLUCOSE, CAPILLARY: Glucose-Capillary: 127 mg/dL — ABNORMAL HIGH (ref 70–99)

## 2023-05-21 MED ORDER — NALOXONE HCL 0.4 MG/ML IJ SOLN: 0.4000 mg | INTRAMUSCULAR | Status: DC | PRN

## 2023-05-21 MED ORDER — METHOCARBAMOL 1000 MG/10ML IJ SOLN
500.0000 mg | Freq: Once | INTRAVENOUS | Status: AC
  Administered 2023-05-21: 500 mg via INTRAVENOUS
  Filled 2023-05-21: qty 5

## 2023-05-21 MED ORDER — LOSARTAN POTASSIUM 25 MG PO TABS
25.0000 mg | ORAL_TABLET | Freq: Every day | ORAL | Status: DC
  Administered 2023-05-22: 25 mg via ORAL
  Filled 2023-05-21 (×2): qty 1

## 2023-05-21 MED ORDER — OXYCODONE-ACETAMINOPHEN 5-325 MG PO TABS
1.0000 | ORAL_TABLET | ORAL | Status: DC | PRN
  Administered 2023-05-21 – 2023-05-27 (×19): 1 via ORAL
  Filled 2023-05-21 (×20): qty 1

## 2023-05-21 MED ORDER — IOHEXOL 300 MG/ML  SOLN
100.0000 mL | Freq: Once | INTRAMUSCULAR | Status: AC | PRN
  Administered 2023-05-21: 100 mL via INTRAVENOUS

## 2023-05-21 MED ORDER — MORPHINE SULFATE (PF) 2 MG/ML IV SOLN
2.0000 mg | INTRAVENOUS | Status: AC | PRN
  Administered 2023-05-21 – 2023-05-22 (×2): 2 mg via INTRAVENOUS
  Filled 2023-05-21 (×2): qty 1

## 2023-05-21 NOTE — Plan of Care (Signed)
  Problem: Education: Goal: Understanding of CV disease, CV risk reduction, and recovery process will improve Outcome: Progressing Goal: Individualized Educational Video(s) Outcome: Progressing   Problem: Activity: Goal: Ability to return to baseline activity level will improve Outcome: Progressing   Problem: Cardiovascular: Goal: Ability to achieve and maintain adequate cardiovascular perfusion will improve Outcome: Progressing Goal: Vascular access site(s) Level 0-1 will be maintained Outcome: Progressing   Problem: Health Behavior/Discharge Planning: Goal: Ability to safely manage health-related needs after discharge will improve Outcome: Progressing   Problem: Education: Goal: Ability to demonstrate management of disease process will improve Outcome: Progressing Goal: Ability to verbalize understanding of medication therapies will improve Outcome: Progressing Goal: Individualized Educational Video(s) Outcome: Progressing   Problem: Activity: Goal: Capacity to carry out activities will improve Outcome: Progressing   Problem: Cardiac: Goal: Ability to achieve and maintain adequate cardiopulmonary perfusion will improve Outcome: Progressing   Problem: Education: Goal: Knowledge of General Education information will improve Description: Including pain rating scale, medication(s)/side effects and non-pharmacologic comfort measures Outcome: Progressing   Problem: Health Behavior/Discharge Planning: Goal: Ability to manage health-related needs will improve Outcome: Progressing   Problem: Clinical Measurements: Goal: Ability to maintain clinical measurements within normal limits will improve Outcome: Progressing Goal: Will remain free from infection Outcome: Progressing Goal: Diagnostic test results will improve Outcome: Progressing Goal: Respiratory complications will improve Outcome: Progressing Goal: Cardiovascular complication will be avoided Outcome:  Progressing   Problem: Activity: Goal: Risk for activity intolerance will decrease Outcome: Progressing   Problem: Nutrition: Goal: Adequate nutrition will be maintained Outcome: Progressing   Problem: Coping: Goal: Level of anxiety will decrease Outcome: Progressing   Problem: Elimination: Goal: Will not experience complications related to bowel motility Outcome: Progressing Goal: Will not experience complications related to urinary retention Outcome: Progressing   Problem: Pain Managment: Goal: General experience of comfort will improve Outcome: Progressing   Problem: Safety: Goal: Ability to remain free from injury will improve Outcome: Progressing   Problem: Skin Integrity: Goal: Risk for impaired skin integrity will decrease Outcome: Progressing   

## 2023-05-21 NOTE — Progress Notes (Addendum)
Rounding Note    Patient Name: Ronnie Mann Date of Encounter: 05/21/2023  [facility] Cardiologist: Julien Nordmann, MD   Subjective   Hgb 9.3 today. Patient denies chest pain or SOB. Urine darker today. Reports lower abdominal and flank pain.  Inpatient Medications    Scheduled Meds:  aspirin EC  81 mg Oral Daily   atorvastatin  40 mg Oral Daily   clopidogrel  75 mg Oral Daily   Fe Fum-Vit C-Vit B12-FA  1 capsule Oral BID   losartan  12.5 mg Oral Daily   melatonin  10 mg Oral QHS   metoprolol succinate  12.5 mg Oral Daily   tamsulosin  0.4 mg Oral QPC breakfast   Continuous Infusions:  sodium chloride Stopped (05/16/23 1959)   PRN Meds: acetaminophen **OR** acetaminophen, ALPRAZolam, HYDROmorphone (DILAUDID) injection, ondansetron **OR** ondansetron (ZOFRAN) IV, oxyCODONE-acetaminophen, polyethylene glycol   Vital Signs    Vitals:   05/20/23 1750 05/20/23 2035 05/20/23 2347 05/21/23 0319  BP: 125/73 130/65 117/68 113/72  Pulse: 80 77 77 76  Resp: 16 (!) 21 18 18   Temp: 97.7 F (36.5 C) 98.7 F (37.1 C) 97.6 F (36.4 C) 98.1 F (36.7 C)  TempSrc:  Oral    SpO2: 99% 100% 99% 100%  Weight:    88.3 kg  Height:        Intake/Output Summary (Last 24 hours) at 05/21/2023 0731 Last data filed at 05/21/2023 0600 Gross per 24 hour  Intake 240 ml  Output 750 ml  Net -510 ml      05/21/2023    3:19 AM 05/20/2023    4:20 AM 05/19/2023    3:40 AM  Last 3 Weights  Weight (lbs) 194 lb 9.6 oz 195 lb 12.8 oz 200 lb 13.4 oz  Weight (kg) 88.27 kg 88.814 kg 91.1 kg      Telemetry    NSR HR 70s, PVCs - Personally Reviewed  ECG    No new - Personally Reviewed  Physical Exam   GEN: No acute distress.   Neck: No JVD Cardiac: RRR, no murmurs, rubs, or gallops.  Respiratory: Clear to auscultation bilaterally. GI: Soft, nontender, non-distended  MS: No edema; No deformity. Neuro:  Nonfocal  Psych: Normal affect   Labs    High Sensitivity Troponin:    Recent Labs  Lab 04/28/23 1347 04/29/23 0504 05/13/23 2116 05/13/23 2308 05/16/23 1156  TROPONINIHS 234* 23,432* 29* 31* 65*     Chemistry Recent Labs  Lab 05/16/23 1156 05/17/23 0502 05/18/23 0449 05/19/23 0638  NA 136 134* 135 138  K 3.9 4.2 4.4 4.3  CL 103 102 106 105  CO2 22 23 26 25   GLUCOSE 232* 126* 122* 100*  BUN 34* 35* 25* 19  CREATININE 1.19 1.05 0.99 0.93  CALCIUM 9.5 8.9 8.7* 8.9  PROT 7.0  --   --   --   ALBUMIN 3.8  --   --   --   AST 35  --   --   --   ALT 22  --   --   --   ALKPHOS 91  --   --   --   BILITOT 0.7  --   --   --   GFRNONAA >60 >60 >60 >60  ANIONGAP 11 9 3* 8    Lipids No results for input(s): "CHOL", "TRIG", "HDL", "LABVLDL", "LDLCALC", "CHOLHDL" in the last 168 hours.  Hematology Recent Labs  Lab 05/19/23 [phone] 05/20/23 9604 05/21/23 5409  WBC 6.5 6.7 7.2  RBC 3.11* 2.92* 3.00*  HGB 9.6* 8.8* 9.3*  HCT 28.8* 27.5* 28.5*  MCV 92.6 94.2 95.0  MCH 30.9 30.1 31.0  MCHC 33.3 32.0 32.6  RDW 16.9* 16.0* 15.8*  PLT 221 207 211   Thyroid No results for input(s): "TSH", "FREET4" in the last 168 hours.  BNP Recent Labs  Lab 05/16/23 1156  BNP 245.5*    DDimer No results for input(s): "DDIMER" in the last 168 hours.   Radiology    No results found.  Cardiac Studies   LHC 05/01/23   Prox LAD to Mid LAD lesion is 99% stenosed.   Mid LAD lesion is 60% stenosed.   1st Diag lesion is 70% stenosed.   Prox RCA lesion is 95% stenosed.   Mid RCA lesion is 30% stenosed.   RPDA lesion is 60% stenosed.   Dist LAD lesion is 99% stenosed.   A drug-eluting stent was successfully placed using a STENT ONYX FRONTIER 4.0X15.   Balloon angioplasty was performed using a BALLN Hansen EUPHORA RX 2.75X20.   Post intervention, there is a 20% residual stenosis.   Post intervention, there is a 0% residual stenosis.   There is severe left ventricular systolic dysfunction.   LV end diastolic pressure is severely elevated.   There is moderate (3+)  mitral regurgitation.   1.  Severe two-vessel coronary artery disease with subtotal occlusion of the proximal LAD, diffuse moderate mid LAD disease and severe distal LAD disease.  In addition, there is 95% in the proximal right coronary artery with faint right to left collaterals to the LAD.  The left circumflex has mild nonobstructive disease and OM 3 is large and reaches the apex. 2.  Severely reduced LV systolic function with an EF of 25%.  Severely elevated left ventricular end-diastolic pressure at 34 mmHg. 3.  Successful balloon angioplasty to the proximal LAD and drug-eluting stent placement to the proximal right coronary artery.   TTE 04/30/23 1. Left ventricular ejection fraction, by estimation, is 30 to 35%. Left  ventricular ejection fraction by PLAX is 32 %. The left ventricle has  moderately decreased function. The left ventricle demonstrates global  hypokinesis with severe hypokinesis of  the anterior, anteroseptal and apical region. The left ventricular  internal cavity size was mildly dilated. There is mild left ventricular  hypertrophy. Left ventricular diastolic parameters are consistent with  Grade II diastolic dysfunction  (pseudonormalization).   2. Right ventricular systolic function is normal. The right ventricular  size is normal. There is normal pulmonary artery systolic pressure.   3. The mitral valve is normal in structure. Moderate mitral valve  regurgitation. No evidence of mitral stenosis.   4. The aortic valve is normal in structure. Aortic valve regurgitation is  not visualized. No aortic stenosis is present.   5. There is mild dilatation of the ascending aorta, measuring 42 mm.   6. The inferior vena cava is normal in size with <50% respiratory  variability, suggesting right atrial pressure of 8 mmHg.   Patient Profile     67 y.o. male with a hx of kidney stones, coronary artery disease status post recent NSTEMI with PCI to the pLAD and PCI/DES to the RCA  (05/01/23), HFrEF (30-35%), ischemic cardiomyopathy, hiatal hernia, and nephrolithiasis, who is being seen 05/17/2023 for the evaluation of hematuria.  Assessment & Plan    Symptomatic Anemia with gross hematuria - Hgb 7.5>>>9.6>8.8>9.3 - s/p 3 units - continues to have hematuria - FOBT  negative - daily CBC - urology with no immediate plans   Elevated Troponin Recent NSTEMI/CAD - previous episode of chest discomfort - HS trop 29>65. Suspect supply demand mismatch in the setting of anemia - s/p PCI/DES 2 weeks ago to the pLAD PCI and PCI/DES to RCA 05/01/2023 - Brilnta discontinued and started on Plavix - continue Plavix and ASA for at least 1 month - continue ASA 81mg  daily, Plavix, Toprol and statin therapy - Keep Hgb >8   HFrEF ICM - SOB the last 2 weeks suspected from symptomatic anemia - LVEF 30-35% on previous admission - continue Toprol 12.5mg  daily and Losartan 12.5mg  daily - no SGLT21 due to UTI and hematuria - continue optimization of GDMT as able   Hydronephrosis of the left kidney/staghorn calculus - Urology saw and plan for outpatient follow-up with no urgent stent placement  For questions or updates, please contact [facility] Please consult [email or web address] for contact info under        Signed, Delynn Olvera David Stall, PA-C  05/21/2023, 7:31 AM

## 2023-05-21 NOTE — Progress Notes (Signed)
[facility] Progress Note  Patient: Ronnie Mann    ZOX:[id number]  DOA: 05/16/2023     Date of Service: the patient was seen and examined on 05/21/2023  Chief Complaint  Patient presents with   Shortness of Breath   Brief hospital course: Ronnie Mann is a 67 y.o. male with medical history significant of recent NSTEMI, s/p PCI to proximal LAD and RCA on 05/01/23, HFrEF(EF 30 to 35%), and nephrolithiasis presented to ED with shortness of breath, bilateral flank pain radiating towards lower abdomen, more on right than left, dysuria and gross hematuria.   Patient with history of bilateral nephrolithiasis, symptoms consistent with symptomatic anemia and renal colic.  Hemoglobin 7.5.   Patient did received a dose of ceftriaxone in ED which was not continued.  Less likely UTI.   Urology was consulted and 2 unit of PRBC ordered.   Assessment and Plan: # Gross hematuria # Symptomatic anemia due to acute blood loss/hematuria Iron profile, folate and B12 within normal range Patient with history of chronic nephrolithiasis and most recent CT abdomen showing staghorn and obstructing stones.  Symptoms are  consistent with renal colic.  Recently placed on DAPT with aspirin and Brilinta.   -Urology consult-recommending close outpatient follow-up for possible cystoscopy and ureteral stent placement. -s/p Total 3 units of PRBC transfusion -keep hemoglobin above 8 with recent NSTEMI. -Renal ultrasound-with persistent severe left hydronephrosis with chronic renal cortical scarring/thinning.  Nonobstructive right staghorn calculus -Pain management Bladder scan to rule out urinary retention Hb 9.3 today (goal >8.0)  # Staghorn calculus Patient was found to have right-sided staghorn calculus and left-sided hydronephrosis with a obstructing stone on most recent CT renal stone study done on 05/13/2023.  Might be contributory to gross hematuria.  Renal ultrasound confirmed. -Urology consult-recommending  close outpatient follow-up.  Will be high risk for definitive treatment of this type of stones due to being on DAPT and recent NSTEMI   # Hydronephrosis of left kidney Patient has a chronic stable left renal hydronephrosis with marked renal cortical thinning due to an obstructing proximal left ureteral calculus. -Urology was consulted   # Elevated troponin Patient with history of recent NSTEMI, s/p cardiac catheterization and PCI to proximal LAD and RCA on 05/01/2023. Slowly rising troponin 29>>65 -Cardiology was consulted and they switched Brilinta with Plavix, would like to continue DAPT due to recent stent placement unless there is a life-threatening bleeding. -Keep hemoglobin above 8   # Ischemic cardiomyopathy Recent diagnosis of acute systolic heart failure with EF of 30 to 35% with NSTEMI.  S/p cardiac cath and PCI to LAD and RCA. -Cardiology consult -switched Brilinta with Plavix. -Continue statin and carvedilol -Daily weight and BMP -Strict intake and output   # Acute hyperglycemia Patient was found to have blood glucose above 200, also noted to have elevated blood glucose level during most recent admissions and A1c was checked and it was 5.5. Patient was placed on SGL 2 inhibitor on discharge. -Continue to monitor   # UTI symptoms UA with bacteriuria.  Patient has bilateral renal colic.  Recent UA was also positive for pyuria and bacteriuria but urine cultures were sterile. No leukocytosis.  Received a dose of ceftriaxone in ED and urine cultures were sent.  Repeat  urine cultures with no growth -No need for antibiotics   Body mass index is 28.74 kg/m.  Interventions:  Diet: Heart healthy diet DVT Prophylaxis: SCD, pharmacological prophylaxis contraindicated due to hematuria    Advance goals of care  discussion: Full code  Family Communication: family was not present at bedside, at the time of interview.  The pt provided permission to discuss medical plan with the  family. Opportunity was given to ask question and all questions were answered satisfactorily.   Disposition:  Pt is from home, admitted with gross hematuria, still has hematuria, which precludes a safe discharge. Discharge to home, when clinically stable.  Subjective: No significant events overnight, patient's lower abdominal pain has been resolved, feels a little bit gas, still has hematuria but it seems improving.  H&H is stable.  Patient denies any chest pain or palpitation, no any other active issues.   Physical Exam: General: NAD, lying comfortably Appear in no distress, affect appropriate Eyes: PERRLA ENT: Oral Mucosa Clear, moist  Neck: no JVD,  Cardiovascular: S1 and S2 Present, no Murmur,  Respiratory: good respiratory effort, Bilateral Air entry equal and Decreased, no Crackles, no wheezes Abdomen: Bowel Sound present, Soft and no tenderness,  Skin: no rashes Extremities: no Pedal edema, no calf tenderness Neurologic: without any new focal findings Gait not checked due to patient safety concerns  Vitals:   05/20/23 2347 05/21/23 0319 05/21/23 0817 05/21/23 1201  BP: 117/68 113/72 126/71 (!) 114/93  Pulse: 77 76 75 74  Resp: 18 18 16 16   Temp: 97.6 F (36.4 C) 98.1 F (36.7 C) 98.2 F (36.8 C) 97.6 F (36.4 C)  TempSrc:      SpO2: 99% 100% 97% 99%  Weight:  88.3 kg    Height:        Intake/Output Summary (Last 24 hours) at 05/21/2023 1419 Last data filed at 05/21/2023 0600 Gross per 24 hour  Intake 240 ml  Output 750 ml  Net -510 ml   Filed Weights   05/19/23 0340 05/20/23 0420 05/21/23 0319  Weight: 91.1 kg 88.8 kg 88.3 kg    Data Reviewed: I have personally reviewed and interpreted daily labs, tele strips, imagings as discussed above. I reviewed all nursing notes, pharmacy notes, vitals, pertinent old records I have discussed plan of care as described above with RN and patient/family.  CBC: Recent Labs  Lab 05/16/23 1156 05/16/23 2259  05/17/23 0502 05/17/23 1752 05/18/23 0449 05/18/23 1904 05/19/23 0638 05/20/23 0629 05/21/23 0517  WBC 6.8  --  6.5  --  5.9  --  6.5 6.7 7.2  NEUTROABS 5.5  --   --   --   --   --   --   --   --   HGB 7.5*   < > 7.9*   < > 8.1* 9.1* 9.6* 8.8* 9.3*  HCT 22.9*  --  24.6*   < > 24.9* 28.0* 28.8* 27.5* 28.5*  MCV 97.9  --  96.9  --  97.6  --  92.6 94.2 95.0  PLT 272  --  245  --  203  --  221 207 211   < > = values in this interval not displayed.   Basic Metabolic Panel: Recent Labs  Lab 05/16/23 1156 05/17/23 0502 05/18/23 0449 05/19/23 0638  NA 136 134* 135 138  K 3.9 4.2 4.4 4.3  CL 103 102 106 105  CO2 22 23 26 25   GLUCOSE 232* 126* 122* 100*  BUN 34* 35* 25* 19  CREATININE 1.19 1.05 0.99 0.93  CALCIUM 9.5 8.9 8.7* 8.9    Studies: No results found.  Scheduled Meds:  aspirin EC  81 mg Oral Daily   atorvastatin  40 mg Oral Daily  clopidogrel  75 mg Oral Daily   Fe Fum-Vit C-Vit B12-FA  1 capsule Oral BID   [START ON 05/22/2023] losartan  25 mg Oral Daily   melatonin  10 mg Oral QHS   metoprolol succinate  12.5 mg Oral Daily   tamsulosin  0.4 mg Oral QPC breakfast   Continuous Infusions:  sodium chloride Stopped (05/16/23 1959)   PRN Meds: acetaminophen **OR** acetaminophen, ALPRAZolam, HYDROmorphone (DILAUDID) injection, ondansetron **OR** ondansetron (ZOFRAN) IV, oxyCODONE-acetaminophen, polyethylene glycol  Time spent: 35 minutes  Author: Gillis Santa. MD [facility] 05/21/2023 2:19 PM  To reach On-call, see care teams to locate the attending and reach out to them via [email or web address]. If 7PM-7AM, please contact night-coverage If you still have difficulty reaching the attending provider, please page the [facility] (Director on Call) for [facility] on amion for assistance.

## 2023-05-22 DIAGNOSIS — R310 Gross hematuria: Secondary | ICD-10-CM | POA: Diagnosis not present

## 2023-05-22 DIAGNOSIS — I5022 Chronic systolic (congestive) heart failure: Secondary | ICD-10-CM | POA: Diagnosis not present

## 2023-05-22 LAB — CBC
HCT: 29 % — ABNORMAL LOW (ref 39.0–52.0)
Hemoglobin: 9.5 g/dL — ABNORMAL LOW (ref 13.0–17.0)
MCH: 30.9 pg (ref 26.0–34.0)
MCHC: 32.8 g/dL (ref 30.0–36.0)
MCV: 94.5 fL (ref 80.0–100.0)
Platelets: 223 10*3/uL (ref 150–400)
RBC: 3.07 MIL/uL — ABNORMAL LOW (ref 4.22–5.81)
RDW: 16.3 % — ABNORMAL HIGH (ref 11.5–15.5)
WBC: 9.2 10*3/uL (ref 4.0–10.5)
nRBC: 0 % (ref 0.0–0.2)

## 2023-05-22 LAB — TROPONIN I (HIGH SENSITIVITY): Troponin I (High Sensitivity): 25 ng/L — ABNORMAL HIGH (ref <18)

## 2023-05-22 LAB — GLUCOSE, CAPILLARY: Glucose-Capillary: 133 mg/dL — ABNORMAL HIGH (ref 70–99)

## 2023-05-22 MED ORDER — CHLORHEXIDINE GLUCONATE CLOTH 2 % EX PADS
6.0000 | MEDICATED_PAD | Freq: Every day | CUTANEOUS | Status: DC
  Administered 2023-05-23 – 2023-05-24 (×2): 6 via TOPICAL

## 2023-05-22 MED ORDER — NITROGLYCERIN 0.4 MG SL SUBL
0.4000 mg | SUBLINGUAL_TABLET | SUBLINGUAL | Status: DC | PRN
  Administered 2023-05-22: 0.4 mg via SUBLINGUAL
  Filled 2023-05-22: qty 1

## 2023-05-22 MED ORDER — CYCLOBENZAPRINE HCL 10 MG PO TABS
10.0000 mg | ORAL_TABLET | Freq: Three times a day (TID) | ORAL | Status: DC | PRN
  Administered 2023-05-24 – 2023-05-27 (×4): 10 mg via ORAL
  Filled 2023-05-22 (×5): qty 1

## 2023-05-22 MED ORDER — HYDROMORPHONE HCL 1 MG/ML IJ SOLN
1.0000 mg | INTRAMUSCULAR | Status: DC | PRN
  Administered 2023-05-22 – 2023-05-24 (×9): 1 mg via INTRAVENOUS
  Filled 2023-05-22 (×9): qty 1

## 2023-05-22 MED ORDER — MORPHINE SULFATE (PF) 2 MG/ML IV SOLN
2.0000 mg | INTRAVENOUS | Status: DC | PRN
  Administered 2023-05-22 – 2023-05-23 (×2): 2 mg via INTRAVENOUS
  Filled 2023-05-22 (×2): qty 1

## 2023-05-22 NOTE — Progress Notes (Addendum)
   Subjective 41f foley placed yesterday by nursing for difficulty voiding Worsening right flank pain last night prompted CT showing new right hydro likely 2/2 blood product in ureter/collecting system, no right ureteral stones  Denies any complaints this AM, pain resolved  Physical Exam: BP (!) 141/79 (BP Location: Right Arm)   Pulse 84   Temp 97.7 F (36.5 C) (Oral)   Resp 18   Ht 5\' 9"  (1.753 m)   Wt 88.9 kg   SpO2 98%   BMI 28.94 kg/m    Constitutional:  Alert and oriented, No acute distress. 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 Evelene Croon.  He has a long history of recurrent nephrolithiasis, including multiple open surgeries on the right side, has not tolerated ureteral stents previously.  Admitted in early June 2024 with NSTEMI, RCA stent placed 05/01/2023, previously on Brilinta but held in the setting of gross hematuria requiring transfusion and transition to Plavix.  Also with cardiomyopathy with EF of 30%.  Developed difficulty urinating on 05/21/2023 and a 16 French Foley was placed by nursing with return of maroon-colored urine.  Also acute onset of right-sided flank pain evening of 05/21/2023 prompting a CT scan showing stable nephrolithiasis, but new blood products within the right collecting system and right proximal ureter causing mild  hydronephrosis, no new ureteral stones.  Symptoms have resolved this morning and he denies any pain.  Very challenging case-significant stone burden bilaterally, left kidney atrophic in the setting of likely chronic obstruction from a left proximal ureteral stone, hematuria on anticoagulation in the setting of recent NSTEMI likely secondary to bilateral stone disease.  Options limited regarding management.  Patient has deferred stent placement as he has not tolerated these previously, and would be unlikely to have a significant impact on either renal function or any abdominal pain in the setting of his left-sided atrophic kidney and chronic obstruction, right-sided symptoms have resolved, and would not recommend right ureteral stent placement at this time.  He is not a candidate for percutaneous nephrostomy tube on either side in the setting of anticoagulation.  -Continue Flomax, consider Foley removal and voiding trial tomorrow morning -Continue Plavix per cardiology recommendations in setting of recent NSTEMI and stent placement -Not candidate for percutaneous nephrolithotomy(PCNL) in the setting of anticoagulation -May need to consider outpatient staged ureteroscopy in the future or surveillance, ureteroscopy could be performed on anticoagulation if needed  I spent 50 total minutes on the floor with greater than 50% spent in counseling and coordination of the care with the patient regarding bilateral nephrolithiasis, anticoagulation in the setting of recent NSTEMI, CT showing blood product causing right-sided hydronephrosis that has since resolved, and plan for surveillance versus outpatient staged ureteroscopy in the future, versus consideration of PCNL after duration of mandatory anticoagulation per cardiology recommendations.  Sondra Come, MD

## 2023-05-22 NOTE — Care Management Important Message (Signed)
Important Message  Patient Details  Name: Ronnie Mann MRN: [id number] Date of Birth: 02-Oct-1956   Medicare Important Message Given:  Yes     [name] 05/22/2023, 12:16 PM

## 2023-05-22 NOTE — Progress Notes (Signed)
Progress Note   Patient: Ronnie Mann ZOX:[id number] DOB: [birth year] DOA: 05/16/2023     6 DOS: the patient was seen and examined on 05/22/2023     Subjective:  Patient seen and examined at bedside this morning Complains of hematuria Denies worsening abdominal pain chest pain nausea vomiting  Brief hospital course: MAURO BOUCH is a 67 y.o. male with medical history significant of recent NSTEMI, s/p PCI to proximal LAD and RCA on 05/01/23, HFrEF(EF 30 to 35%), and nephrolithiasis presented to ED with shortness of breath, bilateral flank pain radiating towards lower abdomen, more on right than left, dysuria and gross hematuria.   Patient with history of bilateral nephrolithiasis, symptoms consistent with symptomatic anemia and renal colic.  Hemoglobin 7.5.   Patient did received a dose of ceftriaxone in ED which was not continued.  Less likely UTI.   Urology was consulted and 2 unit of PRBC ordered.     Assessment and plan # Gross hematuria # Symptomatic anemia due to acute blood loss/hematuria Iron profile, folate and B12 within normal range Patient with history of chronic nephrolithiasis and most recent CT abdomen showing staghorn and obstructing stones.  Symptoms are  consistent with renal colic.  Recently placed on DAPT with aspirin and Brilinta.   -Urology consult-recommending close outpatient follow-up for possible cystoscopy and ureteral stent placement. -s/p Total 3 units of PRBC transfusion -keep hemoglobin above 8 with recent NSTEMI. -Renal ultrasound-with persistent severe left hydronephrosis with chronic renal cortical scarring/thinning.  Nonobstructive right staghorn calculus -Pain management Bladder scan to rule out urinary retention Hb 9.3 today (goal >8.0) I discussed the case with urologist Dr. Richardo Hanks Urologist recommends continuation of Flomax and to consider Foley removal and voiding trial tomorrow morning Continue Plavix per cardiologist recommendation in the  setting of recent NSTEMI and stent placement Not a candidate for percutaneous intervention in the setting of anticoagulation   # Staghorn calculus Patient was found to have right-sided staghorn calculus and left-sided hydronephrosis with a obstructing stone on most recent CT renal stone study done on 05/13/2023.  Might be contributory to gross hematuria.  Renal ultrasound confirmed. -Urology consult-recommending close outpatient follow-up.  Will be high risk for definitive treatment of this type of stones due to being on DAPT and recent NSTEMI   # Hydronephrosis of left kidney Patient has a chronic stable left renal hydronephrosis with marked renal cortical thinning due to an obstructing proximal left ureteral calculus. Urology is on board.  Appreciate input   # Elevated troponin Patient with history of recent NSTEMI, s/p cardiac catheterization and PCI to proximal LAD and RCA on 05/01/2023. Slowly rising troponin 29>>65 -Cardiology switch patient's Brilinta to Plavix Continue dual antiplatelet therapy with Plavix and aspirin Keep hemoglobin above 8   # Ischemic cardiomyopathy Recent diagnosis of acute systolic heart failure with EF of 30 to 35% with NSTEMI.  S/p cardiac cath and PCI to LAD and RCA. -Cardiology consult -switched Brilinta with Plavix. -Continue statin and carvedilol Strict input and output monitoring Monitor daily weights   # Acute hyperglycemia Patient was found to have blood glucose above 200, also noted to have elevated blood glucose level during most recent admissions and A1c was checked and it was 5.5. Patient was placed on SGL 2 inhibitor on discharge. -Continue to monitor   # UTI symptoms UA with bacteriuria.  Patient has bilateral renal colic.  Recent UA was also positive for pyuria and bacteriuria but urine cultures were sterile. No leukocytosis.  Received a dose  of ceftriaxone in ED and urine cultures were sent.  Repeat  urine cultures with no growth -No need  for antibiotics    Diet: Heart healthy diet DVT Prophylaxis: SCD, pharmacological prophylaxis contraindicated due to hematuria     Advance goals of care discussion: Full code    Physical Exam: General: NAD, lying comfortably Appear in no distress, affect appropriate Eyes: PERRLA ENT: Oral Mucosa Clear, moist  Neck: no JVD,  Cardiovascular: S1 and S2 Present, no Murmur,  Respiratory: good respiratory effort, Bilateral Air entry equal and Decreased, no Crackles, no wheezes Abdomen: Bowel Sound present, Soft and no tenderness,  Skin: no rashes Extremities: no Pedal edema, no calf tenderness Neurologic: without any new focal findings Gait not checked due to patient safety concerns  I reviewed the patient's lab results showing stabilization of hemoglobin  Vitals:   05/22/23 0626 05/22/23 0827 05/22/23 0929 05/22/23 1420  BP:  121/80 (!) 114/53 (!) 74/47  Pulse:  73 74 65  Resp: 18 20 18  (!) 25  Temp:  98.1 F (36.7 C) 97.8 F (36.6 C) 97.6 F (36.4 C)  TempSrc:  Oral    SpO2:  100% 100% 100%  Weight:      Height:        Author: Loyce Dys, MD 05/22/2023 3:08 PM  For on call review [email or web address].

## 2023-05-22 NOTE — Progress Notes (Signed)
Rounding Note    Patient Name: Ronnie Mann Date of Encounter: 05/22/2023  [facility] Cardiologist: Julien Nordmann, MD   Subjective   Patient seen on a.m. rounds.  Denies any chest pain or shortness of breath.  Hemoglobin remained stable at 9.5.  Continues to have Foley catheter in place.  Inpatient Medications    Scheduled Meds:  aspirin EC  81 mg Oral Daily   atorvastatin  40 mg Oral Daily   clopidogrel  75 mg Oral Daily   Fe Fum-Vit C-Vit B12-FA  1 capsule Oral BID   losartan  25 mg Oral Daily   melatonin  10 mg Oral QHS   metoprolol succinate  12.5 mg Oral Daily   tamsulosin  0.4 mg Oral QPC breakfast   Continuous Infusions:  sodium chloride Stopped (05/16/23 1959)   PRN Meds: acetaminophen **OR** acetaminophen, ALPRAZolam, HYDROmorphone, naLOXone (NARCAN)  injection, ondansetron **OR** ondansetron (ZOFRAN) IV, oxyCODONE-acetaminophen, polyethylene glycol   Vital Signs    Vitals:   05/21/23 2318 05/22/23 0320 05/22/23 0553 05/22/23 0626  BP: 131/67 (!) 141/79    Pulse: 85 84    Resp: 16 20  18   Temp: (!) 97.5 F (36.4 C) 97.7 F (36.5 C)    TempSrc:  Oral    SpO2: 100% 98%    Weight:   88.9 kg   Height:        Intake/Output Summary (Last 24 hours) at 05/22/2023 0750 Last data filed at 05/22/2023 0000 Gross per 24 hour  Intake 240 ml  Output 550 ml  Net -310 ml      05/22/2023    5:53 AM 05/21/2023    3:19 AM 05/20/2023    4:20 AM  Last 3 Weights  Weight (lbs) 196 lb 194 lb 9.6 oz 195 lb 12.8 oz  Weight (kg) 88.905 kg 88.27 kg 88.814 kg      Telemetry    Sinus rhythm with PACs rates in the 70s- Personally Reviewed  ECG    No new tracings- Personally Reviewed  Physical Exam   GEN: No acute distress.   Neck: No JVD Cardiac: RRR, no murmurs, rubs, or gallops.  Respiratory: Clear to auscultation bilaterally. GI: Soft, nontender, non-distended  MS: No edema; No deformity. Neuro:  Nonfocal  Psych: Normal affect   Labs    High  Sensitivity Troponin:   Recent Labs  Lab 04/28/23 1347 04/29/23 0504 05/13/23 2116 05/13/23 2308 05/16/23 1156  TROPONINIHS 234* 23,432* 29* 31* 65*     Chemistry Recent Labs  Lab 05/16/23 1156 05/17/23 0502 05/18/23 0449 05/19/23 0638  NA 136 134* 135 138  K 3.9 4.2 4.4 4.3  CL 103 102 106 105  CO2 22 23 26 25   GLUCOSE 232* 126* 122* 100*  BUN 34* 35* 25* 19  CREATININE 1.19 1.05 0.99 0.93  CALCIUM 9.5 8.9 8.7* 8.9  PROT 7.0  --   --   --   ALBUMIN 3.8  --   --   --   AST 35  --   --   --   ALT 22  --   --   --   ALKPHOS 91  --   --   --   BILITOT 0.7  --   --   --   GFRNONAA >60 >60 >60 >60  ANIONGAP 11 9 3* 8    Lipids No results for input(s): "CHOL", "TRIG", "HDL", "LABVLDL", "LDLCALC", "CHOLHDL" in the last 168 hours.  Hematology Recent Labs  Lab 05/20/23 0629 05/21/23 0517 05/22/23 0652  WBC 6.7 7.2 9.2  RBC 2.92* 3.00* 3.07*  HGB 8.8* 9.3* 9.5*  HCT 27.5* 28.5* 29.0*  MCV 94.2 95.0 94.5  MCH 30.1 31.0 30.9  MCHC 32.0 32.6 32.8  RDW 16.0* 15.8* 16.3*  PLT 207 211 223   Thyroid No results for input(s): "TSH", "FREET4" in the last 168 hours.  BNP Recent Labs  Lab 05/16/23 1156  BNP 245.5*    DDimer No results for input(s): "DDIMER" in the last 168 hours.   Radiology    CT ABDOMEN PELVIS W CONTRAST  Result Date: 05/21/2023 CLINICAL DATA:  Abdominal pain, acute, nonlocalized EXAM: CT ABDOMEN AND PELVIS WITH CONTRAST TECHNIQUE: Multidetector CT imaging of the abdomen and pelvis was performed using the standard protocol following bolus administration of intravenous contrast. RADIATION DOSE REDUCTION: This exam was performed according to the departmental dose-optimization program which includes automated exposure control, adjustment of the mA and/or kV according to patient size and/or use of iterative reconstruction technique. CONTRAST:  OMNIPAQUE IOHEXOL 300 MG/ML  SOLN COMPARISON:  05/13/2023 FINDINGS: Lower chest: The visualized lung bases are  clear. Mild coronary artery calcification. Large hiatal hernia. Hepatobiliary: Cholelithiasis again noted without superimposed acute pericholecystic inflammatory change. The liver is unremarkable. No intra or extrahepatic biliary ductal dilation. Pancreas: Atrophic but otherwise unremarkable Spleen: Mild splenomegaly with the spleen measuring 13.7 cm in greatest dimension. No intrasplenic lesions. The splenic vein is patent. No perisplenic fluid collections. Adrenals/Urinary Tract: The right adrenal gland is stable 13 mm right and 16 mm left adrenal adenoma, better characterized on a prior noncontrast examination. No follow-up imaging is recommended for these lesions. The kidneys are normal in position. Marked left renal cortical atrophy with only a thin rim of residual renal cortical parenchyma is again identified. Marked left hydronephrosis with an obstructing 8 x 17 mm calculus within the proximal left ureter is again seen and is unchanged. Superimposed staghorn nonobstructing calculus within the lower pole the left kidney is unchanged. Multiple simple cortical cysts are seen within the left kidney for which no follow-up imaging is recommended. Moderate right hydronephrosis has developed since prior examination. Additionally, there is hyperdense material seen within the dilated upper polar calyx adjacent to the dominant upper polar staghorn calculus which measures 4.1 x 4.2 x 3.3 cm. This hyperdense material is seen in intermittent fashion extending into the proximal right ureter and likely represents blood clot given its relatively rapid development since prior examination. No ureteral calculus identified. Distal right ureter is decompressed. Additional 2.6 cm nonobstructing calculus noted within the lower pole the right kidney. Mild right renal cortical atrophy again identified, unchanged. Foley catheter balloon is seen within a decompressed bladder lumen. Stomach/Bowel: Mild sigmoid diverticulosis. Moderate  colonic stool burden without evidence of obstruction. The small and large bowel are otherwise unremarkable. No free intraperitoneal gas or fluid. Vascular/Lymphatic: Aortic atherosclerosis. No enlarged abdominal or pelvic lymph nodes. Reproductive: Mild prostatic hypertrophy. Other: Tiny fat containing left inguinal hernia. No abdominopelvic ascites. Musculoskeletal: Degenerative changes are seen within the lumbar spine. No acute bone abnormality. No lytic or blastic bone lesion. IMPRESSION: 1. Interval development of moderate right hydronephrosis with hyperdense material seen within the dilated upper polar calyx adjacent to the dominant upper polar staghorn calculus. This likely represents blood clot given its relatively rapid development since prior examination. Hyperdense material extends in the proximal and mid right ureter. 2. Stable marked left hydronephrosis with obstructing 8 x 17 mm calculus within the proximal left  ureter. Superimposed staghorn calculus within the lower pole the left kidney is unchanged. 3. Stable mild right and marked left renal cortical atrophy. 4. Cholelithiasis. 5. Large hiatal hernia. 6. Stable mild splenomegaly. 7. Stable bilateral adrenal adenomas. No follow-up imaging is recommended for these lesions. Aortic Atherosclerosis (ICD10-I70.0). Electronically Signed   By: Helyn Numbers M.D.   On: 05/21/2023 18:13    Cardiac Studies   LHC 05/01/23   Prox LAD to Mid LAD lesion is 99% stenosed.   Mid LAD lesion is 60% stenosed.   1st Diag lesion is 70% stenosed.   Prox RCA lesion is 95% stenosed.   Mid RCA lesion is 30% stenosed.   RPDA lesion is 60% stenosed.   Dist LAD lesion is 99% stenosed.   A drug-eluting stent was successfully placed using a STENT ONYX FRONTIER 4.0X15.   Balloon angioplasty was performed using a BALLN [place] EUPHORA RX [id number].   Post intervention, there is a 20% residual stenosis.   Post intervention, there is a 0% residual stenosis.   There is severe  left ventricular systolic dysfunction.   LV end diastolic pressure is severely elevated.   There is moderate (3+) mitral regurgitation.   1.  Severe two-vessel coronary artery disease with subtotal occlusion of the proximal LAD, diffuse moderate mid LAD disease and severe distal LAD disease.  In addition, there is 95% in the proximal right coronary artery with faint right to left collaterals to the LAD.  The left circumflex has mild nonobstructive disease and OM 3 is large and reaches the apex. 2.  Severely reduced LV systolic function with an EF of 25%.  Severely elevated left ventricular end-diastolic pressure at 34 mmHg. 3.  Successful balloon angioplasty to the proximal LAD and drug-eluting stent placement to the proximal right coronary artery.   TTE 04/30/23 1. Left ventricular ejection fraction, by estimation, is 30 to 35%. Left  ventricular ejection fraction by PLAX is 32 %. The left ventricle has  moderately decreased function. The left ventricle demonstrates global  hypokinesis with severe hypokinesis of  the anterior, anteroseptal and apical region. The left ventricular  internal cavity size was mildly dilated. There is mild left ventricular  hypertrophy. Left ventricular diastolic parameters are consistent with  Grade II diastolic dysfunction  (pseudonormalization).   2. Right ventricular systolic function is normal. The right ventricular  size is normal. There is normal pulmonary artery systolic pressure.   3. The mitral valve is normal in structure. Moderate mitral valve  regurgitation. No evidence of mitral stenosis.   4. The aortic valve is normal in structure. Aortic valve regurgitation is  not visualized. No aortic stenosis is present.   5. There is mild dilatation of the ascending aorta, measuring 42 mm.   6. The inferior vena cava is normal in size with <50% respiratory  variability, suggesting right atrial pressure of 8 mmHg.   Patient Profile     67 y.o. male with  past medical history of kidney stones, coronary disease status post recent NSTEMI with PCI to the pLAD and PCI/DES to the RCA (05/01/2023), HFrEF 30-35%), ischemic cardiomyopathy, hiatal hernia, and nephrolithiasis, who has been seen and evaluated for gross hematuria.  Assessment & Plan    Symptomatic anemia with gross hematuria -Hemoglobin today 9.5, remained stable -Has received 3 units of blood via transfusion thus far -Was passing clots with Foley catheter inserted yesterday continues to have dark urine and clots noted in bag -Recommend to keep hemoglobin greater than 8 -Daily CBC -  FOBT negative on arrival -Urology with no immediate plans for stents Foley catheter placed yesterday  Elevated high-sensitivity troponin with recent NSTEMI/CAD -Remains chest pain-free -High-sensitivity troponins trended to 65 suspect supply demand mismatch -Status post PCI/DES 3 weeks ago PCI to pLAD PCI/DES to the RCA -Continued on clopidogrel, aspirin, Toprol, and statin therapy -Continue with telemetry monitoring -EKG as needed for pain or changes  HFrEF/ICM -Continued shortness of breath of the last 2X suspected from symptomatic anemia -LVEF on previous admission 30-25% -Continued on Toprol and losartan -No SGLT2 due to UTI and hematuria -Escalate GDMT as able -Daily weights, I's and O's, low-sodium diet  Hydronephrosis of the left kidney/staghorn calculus -Foley catheter intact -Continues to be followed by urology     For questions or updates, please contact [facility] Please consult [email or web address] for contact info under        Signed, Jarmar Rousseau, NP  05/22/2023, 7:50 AM

## 2023-05-22 NOTE — TOC Progression Note (Signed)
Transition of Care [facility]) - Progression Note    Patient Details  Name: Ronnie Mann MRN: [id number] Date of Birth: 05-Mar-1956  Transition of Care [facility] CM/SW Contact  Margarito Liner, LCSW Phone Number: 05/22/2023, 10:18 AM  Clinical Narrative:   TOC continues to follow for discharge needs.  Expected Discharge Plan: Home/Self Care Barriers to Discharge: Continued Medical Work up  Expected Discharge Plan and Services     Post Acute Care Choice: NA Living arrangements for the past 2 months: Single Family Home                                       Social Determinants of Health (SDOH) Interventions SDOH Screenings   Food Insecurity: No Food Insecurity (05/16/2023)  Housing: Low Risk  (05/16/2023)  Transportation Needs: No Transportation Needs (05/16/2023)  Utilities: Not At Risk (05/16/2023)  Tobacco Use: Low Risk  (05/17/2023)    Readmission Risk Interventions     No data to display

## 2023-05-23 DIAGNOSIS — R310 Gross hematuria: Secondary | ICD-10-CM | POA: Diagnosis not present

## 2023-05-23 DIAGNOSIS — I5022 Chronic systolic (congestive) heart failure: Secondary | ICD-10-CM | POA: Diagnosis not present

## 2023-05-23 DIAGNOSIS — D649 Anemia, unspecified: Secondary | ICD-10-CM | POA: Diagnosis not present

## 2023-05-23 DIAGNOSIS — I2510 Atherosclerotic heart disease of native coronary artery without angina pectoris: Secondary | ICD-10-CM | POA: Diagnosis not present

## 2023-05-23 LAB — CBC WITH DIFFERENTIAL/PLATELET
Abs Immature Granulocytes: 0.01 10*3/uL (ref 0.00–0.07)
Basophils Absolute: 0 10*3/uL (ref 0.0–0.1)
Basophils Relative: 1 %
Eosinophils Absolute: 0.3 10*3/uL (ref 0.0–0.5)
Eosinophils Relative: 5 %
HCT: 27.1 % — ABNORMAL LOW (ref 39.0–52.0)
Hemoglobin: 8.9 g/dL — ABNORMAL LOW (ref 13.0–17.0)
Immature Granulocytes: 0 %
Lymphocytes Relative: 15 %
Lymphs Abs: 1.1 10*3/uL (ref 0.7–4.0)
MCH: 31 pg (ref 26.0–34.0)
MCHC: 32.8 g/dL (ref 30.0–36.0)
MCV: 94.4 fL (ref 80.0–100.0)
Monocytes Absolute: 0.5 10*3/uL (ref 0.1–1.0)
Monocytes Relative: 7 %
Neutro Abs: 5.3 10*3/uL (ref 1.7–7.7)
Neutrophils Relative %: 72 %
Platelets: 201 10*3/uL (ref 150–400)
RBC: 2.87 MIL/uL — ABNORMAL LOW (ref 4.22–5.81)
RDW: 16.1 % — ABNORMAL HIGH (ref 11.5–15.5)
WBC: 7.3 10*3/uL (ref 4.0–10.5)
nRBC: 0 % (ref 0.0–0.2)

## 2023-05-23 LAB — GLUCOSE, CAPILLARY
Glucose-Capillary: 122 mg/dL — ABNORMAL HIGH (ref 70–99)
Glucose-Capillary: 169 mg/dL — ABNORMAL HIGH (ref 70–99)

## 2023-05-23 LAB — BASIC METABOLIC PANEL
Anion gap: 8 (ref 5–15)
BUN: 39 mg/dL — ABNORMAL HIGH (ref 8–23)
CO2: 20 mmol/L — ABNORMAL LOW (ref 22–32)
Calcium: 8.8 mg/dL — ABNORMAL LOW (ref 8.9–10.3)
Chloride: 102 mmol/L (ref 98–111)
Creatinine, Ser: 2.03 mg/dL — ABNORMAL HIGH (ref 0.61–1.24)
GFR, Estimated: 35 mL/min — ABNORMAL LOW (ref >60)
Glucose, Bld: 115 mg/dL — ABNORMAL HIGH (ref 70–99)
Potassium: 4 mmol/L (ref 3.5–5.1)
Sodium: 130 mmol/L — ABNORMAL LOW (ref 135–145)

## 2023-05-23 LAB — TROPONIN I (HIGH SENSITIVITY): Troponin I (High Sensitivity): 28 ng/L — ABNORMAL HIGH (ref <18)

## 2023-05-23 MED ORDER — SODIUM CHLORIDE 0.9 % IV SOLN
1.0000 g | INTRAVENOUS | Status: DC
  Administered 2023-05-23 – 2023-05-26 (×4): 1 g via INTRAVENOUS
  Filled 2023-05-23 (×5): qty 10

## 2023-05-23 MED ORDER — SODIUM CHLORIDE 0.9 % IV SOLN: INTRAVENOUS | Status: AC

## 2023-05-23 MED ORDER — MORPHINE SULFATE (PF) 2 MG/ML IV SOLN
2.0000 mg | INTRAVENOUS | Status: DC | PRN
  Administered 2023-05-23 – 2023-05-27 (×9): 2 mg via INTRAVENOUS
  Filled 2023-05-23 (×10): qty 1

## 2023-05-23 NOTE — Progress Notes (Signed)
Urology Inpatient Progress Note  Subjective: No acute events overnight.  He is afebrile and had an episode of hypotension this morning that has since resolved. Creatinine up today, 2.03.  Hemoglobin down, 8.9. Foley catheter in place draining red urine without clots. He reports bladder discomfort and is worried that his catheter is occluded with clot. He states his catheter has required irrigation multiple times overnight due to catheter occlusion.  Anti-infectives: Anti-infectives (From admission, onward)    Start     Dose/Rate Route Frequency Ordered Stop   05/16/23 1700  cefTRIAXone (ROCEPHIN) 1 g in sodium chloride 0.9 % 100 mL IVPB        1 g 200 mL/hr over 30 Minutes Intravenous  Once 05/16/23 1652 05/16/23 1814       Current Facility-Administered Medications  Medication Dose Route Frequency Provider Last Rate Last Admin   0.9 %  sodium chloride infusion   Intravenous Once Arnetha Courser, MD   Held at 05/16/23 1959   0.9 %  sodium chloride infusion   Intravenous Continuous Djan, Scarlette Calico, MD       acetaminophen (TYLENOL) tablet 650 mg  650 mg Oral Q6H PRN Arnetha Courser, MD   650 mg at 05/16/23 2122   Or   acetaminophen (TYLENOL) suppository 650 mg  650 mg Rectal Q6H PRN Arnetha Courser, MD       ALPRAZolam Prudy Feeler) tablet 0.5 mg  0.5 mg Oral TID PRN Arnetha Courser, MD   0.5 mg at 05/22/23 1821   atorvastatin (LIPITOR) tablet 40 mg  40 mg Oral Daily Arnetha Courser, MD   40 mg at 05/22/23 1610   Chlorhexidine Gluconate Cloth 2 % PADS 6 each  6 each Topical Daily End, Christopher, MD       clopidogrel (PLAVIX) tablet 75 mg  75 mg Oral Daily End, Christopher, MD   75 mg at 05/22/23 0827   cyclobenzaprine (FLEXERIL) tablet 10 mg  10 mg Oral TID PRN Loyce Dys, MD       Fe Fum-Vit C-Vit B12-FA (TRIGELS-F FORTE) capsule 1 capsule  1 capsule Oral BID Arnetha Courser, MD   1 capsule at 05/22/23 2102   HYDROmorphone (DILAUDID) injection 1 mg  1 mg Intravenous Q4H PRN Otelia Sergeant, RPH   1  mg at 05/22/23 1736   losartan (COZAAR) tablet 25 mg  25 mg Oral Daily Debbe Odea, MD   25 mg at 05/22/23 0826   melatonin tablet 10 mg  10 mg Oral QHS Lindajo Royal V, MD   10 mg at 05/22/23 2102   metoprolol succinate (TOPROL-XL) 24 hr tablet 12.5 mg  12.5 mg Oral Daily End, Christopher, MD   12.5 mg at 05/22/23 0827   morphine (PF) 2 MG/ML injection 2 mg  2 mg Intravenous Q2H PRN Mansy, Jan A, MD   2 mg at 05/23/23 9604   naloxone [facility]) injection 0.4 mg  0.4 mg Intravenous PRN Gillis Santa, MD       nitroGLYCERIN (NITROSTAT) SL tablet 0.4 mg  0.4 mg Sublingual Q5 min PRN Mansy, Jan A, MD   0.4 mg at 05/22/23 2318   ondansetron (ZOFRAN) tablet 4 mg  4 mg Oral Q6H PRN Arnetha Courser, MD       Or   ondansetron (ZOFRAN) injection 4 mg  4 mg Intravenous Q6H PRN Arnetha Courser, MD       oxyCODONE-acetaminophen (PERCOCET/ROXICET) 5-325 MG per tablet 1 tablet  1 tablet Oral Q4H PRN Gillis Santa, MD   1  tablet at 05/23/23 0542   polyethylene glycol (MIRALAX / GLYCOLAX) packet 17 g  17 g Oral Daily PRN Arnetha Courser, MD   17 g at 05/18/23 0816   tamsulosin (FLOMAX) capsule 0.4 mg  0.4 mg Oral QPC breakfast Arnetha Courser, MD   0.4 mg at 05/22/23 0826   Objective: Vital signs in last 24 hours: Temp:  [97.5 F (36.4 C)-98 F (36.7 C)] 97.8 F (36.6 C) (07/02 0733) Pulse Rate:  [47-79] 69 (07/02 0733) Resp:  [18-25] 18 (07/02 0733) BP: (74-137)/(47-92) 137/92 (07/02 0923) SpO2:  [97 %-100 %] 100 % (07/02 0733) Weight:  [89.7 kg] 89.7 kg (07/02 0359)  Intake/Output from previous day: 07/01 0701 - 07/02 0700 In: -  Out: 1500 [Urine:1500] Intake/Output this shift: No intake/output data recorded.  Physical Exam Vitals and nursing note reviewed.  Constitutional:      General: He is not in acute distress.    Appearance: He is not ill-appearing, toxic-appearing or diaphoretic.  HENT:     Head: Normocephalic and atraumatic.  Pulmonary:     Effort: Pulmonary effort is normal. No  respiratory distress.  Skin:    General: Skin is warm and dry.  Neurological:     Mental Status: He is alert and oriented to person, place, and time.  Psychiatric:        Mood and Affect: Mood normal.        Behavior: Behavior normal.    Lab Results:  Recent Labs    05/22/23 0652 05/23/23 0445  WBC 9.2 7.3  HGB 9.5* 8.9*  HCT 29.0* 27.1*  PLT 223 201   BMET Recent Labs    05/23/23 0445  NA 130*  K 4.0  CL 102  CO2 20*  GLUCOSE 115*  BUN 39*  CREATININE 2.03*  CALCIUM 8.8*   Studies/Results: CT ABDOMEN PELVIS W CONTRAST  Result Date: 05/21/2023 CLINICAL DATA:  Abdominal pain, acute, nonlocalized EXAM: CT ABDOMEN AND PELVIS WITH CONTRAST TECHNIQUE: Multidetector CT imaging of the abdomen and pelvis was performed using the standard protocol following bolus administration of intravenous contrast. RADIATION DOSE REDUCTION: This exam was performed according to the departmental dose-optimization program which includes automated exposure control, adjustment of the mA and/or kV according to patient size and/or use of iterative reconstruction technique. CONTRAST:  OMNIPAQUE IOHEXOL 300 MG/ML  SOLN COMPARISON:  05/13/2023 FINDINGS: Lower chest: The visualized lung bases are clear. Mild coronary artery calcification. Large hiatal hernia. Hepatobiliary: Cholelithiasis again noted without superimposed acute pericholecystic inflammatory change. The liver is unremarkable. No intra or extrahepatic biliary ductal dilation. Pancreas: Atrophic but otherwise unremarkable Spleen: Mild splenomegaly with the spleen measuring 13.7 cm in greatest dimension. No intrasplenic lesions. The splenic vein is patent. No perisplenic fluid collections. Adrenals/Urinary Tract: The right adrenal gland is stable 13 mm right and 16 mm left adrenal adenoma, better characterized on a prior noncontrast examination. No follow-up imaging is recommended for these lesions. The kidneys are normal in position. Marked left  renal cortical atrophy with only a thin rim of residual renal cortical parenchyma is again identified. Marked left hydronephrosis with an obstructing 8 x 17 mm calculus within the proximal left ureter is again seen and is unchanged. Superimposed staghorn nonobstructing calculus within the lower pole the left kidney is unchanged. Multiple simple cortical cysts are seen within the left kidney for which no follow-up imaging is recommended. Moderate right hydronephrosis has developed since prior examination. Additionally, there is hyperdense material seen within the dilated upper polar calyx adjacent  to the dominant upper polar staghorn calculus which measures 4.1 x 4.2 x 3.3 cm. This hyperdense material is seen in intermittent fashion extending into the proximal right ureter and likely represents blood clot given its relatively rapid development since prior examination. No ureteral calculus identified. Distal right ureter is decompressed. Additional 2.6 cm nonobstructing calculus noted within the lower pole the right kidney. Mild right renal cortical atrophy again identified, unchanged. Foley catheter balloon is seen within a decompressed bladder lumen. Stomach/Bowel: Mild sigmoid diverticulosis. Moderate colonic stool burden without evidence of obstruction. The small and large bowel are otherwise unremarkable. No free intraperitoneal gas or fluid. Vascular/Lymphatic: Aortic atherosclerosis. No enlarged abdominal or pelvic lymph nodes. Reproductive: Mild prostatic hypertrophy. Other: Tiny fat containing left inguinal hernia. No abdominopelvic ascites. Musculoskeletal: Degenerative changes are seen within the lumbar spine. No acute bone abnormality. No lytic or blastic bone lesion. IMPRESSION: 1. Interval development of moderate right hydronephrosis with hyperdense material seen within the dilated upper polar calyx adjacent to the dominant upper polar staghorn calculus. This likely represents blood clot given its  relatively rapid development since prior examination. Hyperdense material extends in the proximal and mid right ureter. 2. Stable marked left hydronephrosis with obstructing 8 x 17 mm calculus within the proximal left ureter. Superimposed staghorn calculus within the lower pole the left kidney is unchanged. 3. Stable mild right and marked left renal cortical atrophy. 4. Cholelithiasis. 5. Large hiatal hernia. 6. Stable mild splenomegaly. 7. Stable bilateral adrenal adenomas. No follow-up imaging is recommended for these lesions. Aortic Atherosclerosis (ICD10-I70.0). Electronically Signed   By: Helyn Numbers M.D.   On: 05/21/2023 18:13    Bladder Irrigation  Due to gross hematuria patient is present today for a bladder irrigation. Patient was cleaned and prepped in a sterile fashion. 180 mL of sterile saline was instilled into the bladder with a 70mL [place] syringe through the catheter in place.  of urine return was cleared from the bladder with evacuation of <3ccs of clot fragments. Efflux cleared from red to pink with the procedure and the catheter irrigated easily. Upon completion, the catheter was draining well. Patient tolerated well.   Performed by: Carman Ching, PA-C   Catheter Removal  Patient is present today for a catheter removal.  9ml of water was drained from the balloon. A 16FR foley cath was removed from the bladder, no complications were noted. Patient tolerated well.  Performed by: Carman Ching, PA-C   Assessment & Plan: 67 year old comorbid male with recent NSTEMI s/p PCI to proximal LAD and RCA on DAPT with aspirin and Plavix, HFrEF, and nephrolithiasis with bilateral partial staghorn stones and chronic proximal left ureteral stone with left hydronephrosis and left renal atrophy now admitted with symptomatic anemia due to gross hematuria.  Creatinine is up somewhat today, though anticipate this is likely secondary to intermittent hypotension and possible  catheter occlusion overnight.  I manually irrigated his Foley catheter at the bedside, then removed the catheter for an inpatient voiding trial today; see above.  Will place orders for postvoid bladder scan in at least 4 hours, or sooner if unable to void and having pain.  Would recommend 2 days of empiric IV antibiotics for UTI prevention in the setting of Foley catheter and irrigation today.  Recommendations: -Continue to trend CBC, BMP -2 days of empiric IV antibiotics for UTI prevention as above -Inpatient voiding trial today, bladder scan orders placed  [facility], PA-C 05/23/2023

## 2023-05-23 NOTE — Progress Notes (Signed)
Progress Note   Patient: Ronnie Mann NWG:[id number] DOB: 01/16/1956 DOA: 05/16/2023     7 DOS: the patient was seen and examined on 05/23/2023        Subjective:  Patient seen and examined at bedside this morning Hematuria has begun clearing up Foley catheter has been taken out by urologist today Voiding trial showed no significant urinary retention Urologist recommends 2 days of empiric IV antibiotics for UTI prevention Denies worsening abdominal pain chest pain nausea vomiting   Brief hospital course: Ronnie Mann is a 67 y.o. male with medical history significant of recent NSTEMI, s/p PCI to proximal LAD and RCA on 05/01/23, HFrEF(EF 30 to 35%), and nephrolithiasis presented to ED with shortness of breath, bilateral flank pain radiating towards lower abdomen, more on right than left, dysuria and gross hematuria.   Patient with history of bilateral nephrolithiasis, symptoms consistent with symptomatic anemia and renal colic.  Hemoglobin 7.5.   Patient did received a dose of ceftriaxone in ED which was not continued.  Less likely UTI.   Urology was consulted and 2 unit of PRBC ordered."     Assessment and plan # Gross hematuria # Symptomatic anemia due to acute blood loss/hematuria Iron profile, folate and B12 within normal range Patient with history of chronic nephrolithiasis and most recent CT abdomen showing staghorn and obstructing stones.  Symptoms are  consistent with renal colic.  Recently placed on DAPT with aspirin and Brilinta.   -Urology consult-recommending close outpatient follow-up for possible cystoscopy and ureteral stent placement. -s/p Total 3 units of PRBC transfusion -keep hemoglobin above 8 with recent NSTEMI. -Renal ultrasound-with persistent severe left hydronephrosis with chronic renal cortical scarring/thinning.  Nonobstructive right staghorn calculus Continue pain management I discussed the case with urologist Dr. Richardo Hanks Urologist recommends  continuation of Flomax  Foley catheter has been taken out by urologist Voiding trial showed no significant urinary retention Urologist recommends 2 days of empiric IV antibiotics for UTI prevention Have placed the patient on IV ceftriaxone  Continue Plavix per cardiologist recommendation in the setting of recent NSTEMI and stent placement Aspirin discontinued by cardiologist Not a candidate for percutaneous intervention in the setting of anticoagulation   # Staghorn calculus Patient was found to have right-sided staghorn calculus and left-sided hydronephrosis with a obstructing stone on most recent CT renal stone study done on 05/13/2023.  Might be contributory to gross hematuria.  Renal ultrasound confirmed. -Urology consult-recommending close outpatient follow-up.  Will be high risk for definitive treatment of this type of stones due to being on DAPT and recent NSTEMI We will continue management as above   # Hydronephrosis of left kidney Patient has a chronic stable left renal hydronephrosis with marked renal cortical thinning due to an obstructing proximal left ureteral calculus. Urology is on board.  Appreciate input   # Elevated troponin Patient with history of recent NSTEMI, s/p cardiac catheterization and PCI to proximal LAD and RCA on 05/01/2023. Slowly rising troponin 29>>65 -Cardiology switch patient's Brilinta to Plavix Continue on Plavix, Toprol as well as statin therapy Aspirin has been discontinued by cardiologist Continue to keep hemoglobin above   # Ischemic cardiomyopathy Recent diagnosis of acute systolic heart failure with EF of 30 to 35% with NSTEMI.  S/p cardiac cath and PCI to LAD and RCA. -Cardiology consult -switched Brilinta with Plavix. -Continue statin and carvedilol Strict input and output monitoring Monitor daily weights   # Acute hyperglycemia Patient was found to have blood glucose above 200, also noted  to have elevated blood glucose level during most  recent admissions and A1c was checked and it was 5.5. Patient was placed on SGL 2 inhibitor on discharge. Continue to monitor glucose level closely  # UTI symptoms UA with bacteriuria.  Patient has bilateral renal colic.  Recent UA was also positive for pyuria and bacteriuria but urine cultures were sterile. Urologist recommended two days IV antibiotics for UTI prevention     Diet: Heart healthy diet DVT Prophylaxis: SCD, pharmacological prophylaxis contraindicated due to hematuria     Advance goals of care discussion: Full code     Physical Exam: General: NAD, lying comfortably Appear in no distress, affect appropriate Eyes: PERRLA ENT: Oral Mucosa Clear, moist  Neck: no JVD,  Cardiovascular: S1 and S2 Present, no Murmur,  Respiratory: good respiratory effort, Bilateral Air entry equal and Decreased, no Crackles, no wheezes Abdomen: Bowel Sound present, Soft and no tenderness,  Skin: no rashes Extremities: no Pedal edema, no calf tenderness Neurologic: without any new focal findings Gait not checked due to patient safety concerns   Data reviewed: I reviewed documentation by cardiologist, I personally discussed the case with the urology team and reviewed patient's CBC as well as BMP   Vitals:   05/23/23 0923 05/23/23 1055 05/23/23 1356 05/23/23 1510  BP: (!) 137/92 (!) 117/59 116/88 135/70  Pulse:    77  Resp:    16  Temp:    97.8 F (36.6 C)  TempSrc:      SpO2:    100%  Weight:      Height:        Author: Loyce Dys, MD 05/23/2023 3:59 PM  For on call review [email or web address].

## 2023-05-23 NOTE — Progress Notes (Signed)
Earlier in the shift, this pt c/o 5/10 pain to left chest, same sore in character, non-radiating. 2nd pain site to left arm rated at 5/10 and throbbing in nature.   On call provided informed. EKG, Troponin X2, Morphine 2mg  IV Q2 PRN, Nitroglycerin 0.4mg  SL Q5 min X3 PRN ordered, same implemented as appropriate.   Pt voiced no pain post medication administration, remains asymptomatic.

## 2023-05-23 NOTE — Plan of Care (Signed)
  Problem: Education: Goal: Understanding of CV disease, CV risk reduction, and recovery process will improve Outcome: Progressing Goal: Individualized Educational Video(s) Outcome: Progressing   Problem: Activity: Goal: Ability to return to baseline activity level will improve Outcome: Progressing   Problem: Cardiovascular: Goal: Ability to achieve and maintain adequate cardiovascular perfusion will improve Outcome: Progressing Goal: Vascular access site(s) Level 0-1 will be maintained Outcome: Progressing   Problem: Health Behavior/Discharge Planning: Goal: Ability to safely manage health-related needs after discharge will improve Outcome: Progressing   Problem: Education: Goal: Ability to demonstrate management of disease process will improve Outcome: Progressing Goal: Ability to verbalize understanding of medication therapies will improve Outcome: Progressing Goal: Individualized Educational Video(s) Outcome: Progressing   Problem: Activity: Goal: Capacity to carry out activities will improve Outcome: Progressing   Problem: Cardiac: Goal: Ability to achieve and maintain adequate cardiopulmonary perfusion will improve Outcome: Progressing   Problem: Education: Goal: Knowledge of General Education information will improve Description: Including pain rating scale, medication(s)/side effects and non-pharmacologic comfort measures Outcome: Progressing   Problem: Health Behavior/Discharge Planning: Goal: Ability to manage health-related needs will improve Outcome: Progressing   Problem: Clinical Measurements: Goal: Ability to maintain clinical measurements within normal limits will improve Outcome: Progressing Goal: Will remain free from infection Outcome: Progressing Goal: Diagnostic test results will improve Outcome: Progressing Goal: Respiratory complications will improve Outcome: Progressing Goal: Cardiovascular complication will be avoided Outcome:  Progressing   Problem: Activity: Goal: Risk for activity intolerance will decrease Outcome: Progressing   Problem: Nutrition: Goal: Adequate nutrition will be maintained Outcome: Progressing   Problem: Coping: Goal: Level of anxiety will decrease Outcome: Progressing   Problem: Elimination: Goal: Will not experience complications related to bowel motility Outcome: Progressing Goal: Will not experience complications related to urinary retention Outcome: Progressing   Problem: Pain Managment: Goal: General experience of comfort will improve Outcome: Progressing   Problem: Safety: Goal: Ability to remain free from injury will improve Outcome: Progressing   Problem: Skin Integrity: Goal: Risk for impaired skin integrity will decrease Outcome: Progressing   

## 2023-05-23 NOTE — Progress Notes (Signed)
Rounding Note    Patient Name: Ronnie Mann Date of Encounter: 05/23/2023  [facility] Cardiologist: Julien Nordmann, MD   Subjective   Patient seen on a.m. rounds.  Continues to deny chest pain shortness of breath.  Hemoglobin has remained stable at 8.9 this morning.  He is -2 L output in the last 24 hours this continued to be gross hematuria.  He did have an episode last evening where he had he stated a panic attack and was in an extreme amount of pain he ended up having Nitrostat that was given as well and has as needed pain medication and his panic attack did resolve.  He states that he has off-and-on had lower abdominal discomfort as he has been unable to urinate as his catheter was becoming clogged with blood clots.  Inpatient Medications    Scheduled Meds:  atorvastatin  40 mg Oral Daily   Chlorhexidine Gluconate Cloth  6 each Topical Daily   clopidogrel  75 mg Oral Daily   Fe Fum-Vit C-Vit B12-FA  1 capsule Oral BID   losartan  25 mg Oral Daily   melatonin  10 mg Oral QHS   metoprolol succinate  12.5 mg Oral Daily   tamsulosin  0.4 mg Oral QPC breakfast   Continuous Infusions:  sodium chloride Stopped (05/16/23 1959)   sodium chloride 125 mL/hr at 05/23/23 0934   PRN Meds: acetaminophen **OR** acetaminophen, ALPRAZolam, cyclobenzaprine, HYDROmorphone, morphine injection, naLOXone (NARCAN)  injection, nitroGLYCERIN, ondansetron **OR** ondansetron (ZOFRAN) IV, oxyCODONE-acetaminophen, polyethylene glycol   Vital Signs    Vitals:   05/23/23 0923 05/23/23 1055 05/23/23 1356 05/23/23 1510  BP: (!) 137/92 (!) 117/59 116/88 135/70  Pulse:    77  Resp:    16  Temp:    97.8 F (36.6 C)  TempSrc:      SpO2:    100%  Weight:      Height:        Intake/Output Summary (Last 24 hours) at 05/23/2023 1530 Last data filed at 05/23/2023 1428 Gross per 24 hour  Intake 240 ml  Output 2250 ml  Net -2010 ml      05/23/2023    3:59 AM 05/22/2023    5:53 AM 05/21/2023     3:19 AM  Last 3 Weights  Weight (lbs) 197 lb 12 oz 196 lb 194 lb 9.6 oz  Weight (kg) 89.7 kg 88.905 kg 88.27 kg      Telemetry    Sinus rhythm with PACs rates in the 70s- Personally Reviewed  ECG    No new tracings- Personally Reviewed  Physical Exam   GEN: No acute distress.   Neck: No JVD Cardiac: RRR, no murmurs, rubs, or gallops.  Respiratory: Clear to auscultation bilaterally. GI: Soft, nontender, non-distended  MS: No edema; No deformity. Neuro:  Nonfocal  Psych: Normal affect   Labs    High Sensitivity Troponin:   Recent Labs  Lab 05/13/23 2116 05/13/23 2308 05/16/23 1156 05/22/23 2313 05/23/23 0053  TROPONINIHS 29* 31* 65* 25* 28*     Chemistry Recent Labs  Lab 05/18/23 0449 05/19/23 0638 05/23/23 0445  NA 135 138 130*  K 4.4 4.3 4.0  CL 106 105 102  CO2 26 25 20*  GLUCOSE 122* 100* 115*  BUN 25* 19 39*  CREATININE 0.99 0.93 2.03*  CALCIUM 8.7* 8.9 8.8*  GFRNONAA >60 >60 35*  ANIONGAP 3* 8 8    Lipids No results for input(s): "CHOL", "TRIG", "HDL", "LABVLDL", "LDLCALC", "  CHOLHDL" in the last 168 hours.  Hematology Recent Labs  Lab 05/21/23 0517 05/22/23 0652 05/23/23 0445  WBC 7.2 9.2 7.3  RBC 3.00* 3.07* 2.87*  HGB 9.3* 9.5* 8.9*  HCT 28.5* 29.0* 27.1*  MCV 95.0 94.5 94.4  MCH 31.0 30.9 31.0  MCHC 32.6 32.8 32.8  RDW 15.8* 16.3* 16.1*  PLT 211 223 201   Thyroid No results for input(s): "TSH", "FREET4" in the last 168 hours.  BNPNo results for input(s): "BNP", "PROBNP" in the last 168 hours.  DDimer No results for input(s): "DDIMER" in the last 168 hours.   Radiology    CT ABDOMEN PELVIS W CONTRAST  Result Date: 05/21/2023 CLINICAL DATA:  Abdominal pain, acute, nonlocalized EXAM: CT ABDOMEN AND PELVIS WITH CONTRAST TECHNIQUE: Multidetector CT imaging of the abdomen and pelvis was performed using the standard protocol following bolus administration of intravenous contrast. RADIATION DOSE REDUCTION: This exam was performed according  to the departmental dose-optimization program which includes automated exposure control, adjustment of the mA and/or kV according to patient size and/or use of iterative reconstruction technique. CONTRAST:  OMNIPAQUE IOHEXOL 300 MG/ML  SOLN COMPARISON:  05/13/2023 FINDINGS: Lower chest: The visualized lung bases are clear. Mild coronary artery calcification. Large hiatal hernia. Hepatobiliary: Cholelithiasis again noted without superimposed acute pericholecystic inflammatory change. The liver is unremarkable. No intra or extrahepatic biliary ductal dilation. Pancreas: Atrophic but otherwise unremarkable Spleen: Mild splenomegaly with the spleen measuring 13.7 cm in greatest dimension. No intrasplenic lesions. The splenic vein is patent. No perisplenic fluid collections. Adrenals/Urinary Tract: The right adrenal gland is stable 13 mm right and 16 mm left adrenal adenoma, better characterized on a prior noncontrast examination. No follow-up imaging is recommended for these lesions. The kidneys are normal in position. Marked left renal cortical atrophy with only a thin rim of residual renal cortical parenchyma is again identified. Marked left hydronephrosis with an obstructing 8 x 17 mm calculus within the proximal left ureter is again seen and is unchanged. Superimposed staghorn nonobstructing calculus within the lower pole the left kidney is unchanged. Multiple simple cortical cysts are seen within the left kidney for which no follow-up imaging is recommended. Moderate right hydronephrosis has developed since prior examination. Additionally, there is hyperdense material seen within the dilated upper polar calyx adjacent to the dominant upper polar staghorn calculus which measures 4.1 x 4.2 x 3.3 cm. This hyperdense material is seen in intermittent fashion extending into the proximal right ureter and likely represents blood clot given its relatively rapid development since prior examination. No ureteral calculus  identified. Distal right ureter is decompressed. Additional 2.6 cm nonobstructing calculus noted within the lower pole the right kidney. Mild right renal cortical atrophy again identified, unchanged. Foley catheter balloon is seen within a decompressed bladder lumen. Stomach/Bowel: Mild sigmoid diverticulosis. Moderate colonic stool burden without evidence of obstruction. The small and large bowel are otherwise unremarkable. No free intraperitoneal gas or fluid. Vascular/Lymphatic: Aortic atherosclerosis. No enlarged abdominal or pelvic lymph nodes. Reproductive: Mild prostatic hypertrophy. Other: Tiny fat containing left inguinal hernia. No abdominopelvic ascites. Musculoskeletal: Degenerative changes are seen within the lumbar spine. No acute bone abnormality. No lytic or blastic bone lesion. IMPRESSION: 1. Interval development of moderate right hydronephrosis with hyperdense material seen within the dilated upper polar calyx adjacent to the dominant upper polar staghorn calculus. This likely represents blood clot given its relatively rapid development since prior examination. Hyperdense material extends in the proximal and mid right ureter. 2. Stable marked left hydronephrosis with  obstructing 8 x 17 mm calculus within the proximal left ureter. Superimposed staghorn calculus within the lower pole the left kidney is unchanged. 3. Stable mild right and marked left renal cortical atrophy. 4. Cholelithiasis. 5. Large hiatal hernia. 6. Stable mild splenomegaly. 7. Stable bilateral adrenal adenomas. No follow-up imaging is recommended for these lesions. Aortic Atherosclerosis (ICD10-I70.0). Electronically Signed   By: Helyn Numbers M.D.   On: 05/21/2023 18:13    Cardiac Studies   LHC 05/01/23   Prox LAD to Mid LAD lesion is 99% stenosed.   Mid LAD lesion is 60% stenosed.   1st Diag lesion is 70% stenosed.   Prox RCA lesion is 95% stenosed.   Mid RCA lesion is 30% stenosed.   RPDA lesion is 60% stenosed.    Dist LAD lesion is 99% stenosed.   A drug-eluting stent was successfully placed using a STENT ONYX FRONTIER 4.0X15.   Balloon angioplasty was performed using a BALLN [place] EUPHORA RX 2.75X20.   Post intervention, there is a 20% residual stenosis.   Post intervention, there is a 0% residual stenosis.   There is severe left ventricular systolic dysfunction.   LV end diastolic pressure is severely elevated.   There is moderate (3+) mitral regurgitation.   1.  Severe two-vessel coronary artery disease with subtotal occlusion of the proximal LAD, diffuse moderate mid LAD disease and severe distal LAD disease.  In addition, there is 95% in the proximal right coronary artery with faint right to left collaterals to the LAD.  The left circumflex has mild nonobstructive disease and OM 3 is large and reaches the apex. 2.  Severely reduced LV systolic function with an EF of 25%.  Severely elevated left ventricular end-diastolic pressure at 34 mmHg. 3.  Successful balloon angioplasty to the proximal LAD and drug-eluting stent placement to the proximal right coronary artery.   TTE 04/30/23 1. Left ventricular ejection fraction, by estimation, is 30 to 35%. Left  ventricular ejection fraction by PLAX is 32 %. The left ventricle has  moderately decreased function. The left ventricle demonstrates global  hypokinesis with severe hypokinesis of  the anterior, anteroseptal and apical region. The left ventricular  internal cavity size was mildly dilated. There is mild left ventricular  hypertrophy. Left ventricular diastolic parameters are consistent with  Grade II diastolic dysfunction  (pseudonormalization).   2. Right ventricular systolic function is normal. The right ventricular  size is normal. There is normal pulmonary artery systolic pressure.   3. The mitral valve is normal in structure. Moderate mitral valve  regurgitation. No evidence of mitral stenosis.   4. The aortic valve is normal in structure.  Aortic valve regurgitation is  not visualized. No aortic stenosis is present.   5. There is mild dilatation of the ascending aorta, measuring 42 mm.   6. The inferior vena cava is normal in size with <50% respiratory  variability, suggesting right atrial pressure of 8 mmHg.   Patient Profile     67 y.o. male with a past medical history of kidney stones, coronary disease status post recent NSTEMI with PCI to the pLAD and PCI/DES to the RCA (05/01/2023), HFrEF (30-35%), ischemic cardiomyopathy, hiatal hernia, and nephrolithiasis, has been seen and evaluated for gross hematuria with the need for dual antiplatelet therapy status post NSTEMI with stent placement.  Assessment & Plan    Symptomatic anemia with continued gross hematuria -Hemoglobin stable at 8.9 -Has received 3 transfusions thus far -Continued to pass clots clogging his Foley catheter -  Catheter removed this morning continues to have gross hematuria with clots noted in the urinal -Daily CBC -Urology continues to follow with no immediate plans for stents  High-sensitivity troponin elevated with recent NSTEMI/CAD -Chest pain-free on exam -High-sensitivity troponins trended to 65 suspect supply demand mismatch -Status post PCI/DES 3 weeks ago PCI to the pLAD and PCI/DES to the RCA -Continued on clopidogrel Toprol and statin therapy -Aspirin discontinued yesterday for continued hematuria -Continue with telemetry monitoring -EKG as needed for pain or changes  HFrEF/ICM --2 L output in the last 24 hours -LVEF 30-35% on echocardiogram -Toprol and losartan held this a.m. due to low blood pressures -No SGLT2 inhibitor due to UTI and hematuria -Continue to escalate GDMT as able -Continue with heart failure education -Daily weights, I's and O's, low-sodium diet  Hydronephrosis of left kidney/staghorn calculus -Foley catheter removed today by urology after irrigation -Continues to be followed by urology     For questions or  updates, please contact [facility] Please consult [email or web address] for contact info under        Signed, Regina Ganci, NP  05/23/2023, 3:30 PM

## 2023-05-24 ENCOUNTER — Telehealth: Payer: Self-pay | Admitting: Urology

## 2023-05-24 ENCOUNTER — Ambulatory Visit: Payer: Self-pay | Admitting: Urology

## 2023-05-24 DIAGNOSIS — I2510 Atherosclerotic heart disease of native coronary artery without angina pectoris: Secondary | ICD-10-CM | POA: Diagnosis not present

## 2023-05-24 DIAGNOSIS — D649 Anemia, unspecified: Secondary | ICD-10-CM | POA: Diagnosis not present

## 2023-05-24 DIAGNOSIS — R319 Hematuria, unspecified: Secondary | ICD-10-CM | POA: Diagnosis not present

## 2023-05-24 DIAGNOSIS — R310 Gross hematuria: Secondary | ICD-10-CM | POA: Diagnosis not present

## 2023-05-24 LAB — CBC WITH DIFFERENTIAL/PLATELET
Abs Immature Granulocytes: 0.04 10*3/uL (ref 0.00–0.07)
Basophils Absolute: 0 10*3/uL (ref 0.0–0.1)
Basophils Relative: 0 %
Eosinophils Absolute: 0.1 10*3/uL (ref 0.0–0.5)
Eosinophils Relative: 1 %
HCT: 26.3 % — ABNORMAL LOW (ref 39.0–52.0)
Hemoglobin: 8.4 g/dL — ABNORMAL LOW (ref 13.0–17.0)
Immature Granulocytes: 0 %
Lymphocytes Relative: 5 %
Lymphs Abs: 0.4 10*3/uL — ABNORMAL LOW (ref 0.7–4.0)
MCH: 30.7 pg (ref 26.0–34.0)
MCHC: 31.9 g/dL (ref 30.0–36.0)
MCV: 96 fL (ref 80.0–100.0)
Monocytes Absolute: 0.5 10*3/uL (ref 0.1–1.0)
Monocytes Relative: 6 %
Neutro Abs: 8 10*3/uL — ABNORMAL HIGH (ref 1.7–7.7)
Neutrophils Relative %: 88 %
Platelets: 164 10*3/uL (ref 150–400)
RBC: 2.74 MIL/uL — ABNORMAL LOW (ref 4.22–5.81)
RDW: 15.9 % — ABNORMAL HIGH (ref 11.5–15.5)
WBC: 9.1 10*3/uL (ref 4.0–10.5)
nRBC: 0 % (ref 0.0–0.2)

## 2023-05-24 LAB — GLUCOSE, CAPILLARY: Glucose-Capillary: 110 mg/dL — ABNORMAL HIGH (ref 70–99)

## 2023-05-24 LAB — BASIC METABOLIC PANEL
Anion gap: 8 (ref 5–15)
BUN: 42 mg/dL — ABNORMAL HIGH (ref 8–23)
CO2: 20 mmol/L — ABNORMAL LOW (ref 22–32)
Calcium: 8.6 mg/dL — ABNORMAL LOW (ref 8.9–10.3)
Chloride: 104 mmol/L (ref 98–111)
Creatinine, Ser: 2.58 mg/dL — ABNORMAL HIGH (ref 0.61–1.24)
GFR, Estimated: 26 mL/min — ABNORMAL LOW (ref >60)
Glucose, Bld: 152 mg/dL — ABNORMAL HIGH (ref 70–99)
Potassium: 5.1 mmol/L (ref 3.5–5.1)
Sodium: 132 mmol/L — ABNORMAL LOW (ref 135–145)

## 2023-05-24 MED ORDER — SODIUM BICARBONATE 650 MG PO TABS
1300.0000 mg | ORAL_TABLET | Freq: Two times a day (BID) | ORAL | Status: DC
  Administered 2023-05-24 – 2023-05-27 (×6): 1300 mg via ORAL
  Filled 2023-05-24 (×6): qty 2

## 2023-05-24 NOTE — Telephone Encounter (Signed)
He will need an appointment early next week to discuss stone treatment.  He is currently in the hospital, but will likely be discharged before the weekend.

## 2023-05-24 NOTE — Progress Notes (Addendum)
Rounding Note    Patient Name: Ronnie Mann Date of Encounter: 05/24/2023  [facility] Cardiologist: Julien Nordmann, MD   Subjective   Reports having significant right groin, right lower epigastric pain last night, could not get enough pain medications for comfort Reports that he asked the nurses several times overnight  Notes from urology reviewed, he continues to decline stent placement Discussed with him today worsening renal function Again reports he is not ready for stent placement, " maybe next week" In the past he had significant pain following stent placement and inadequate pain control it would seem  Inpatient Medications    Scheduled Meds:  atorvastatin  40 mg Oral Daily   Chlorhexidine Gluconate Cloth  6 each Topical Daily   clopidogrel  75 mg Oral Daily   Fe Fum-Vit C-Vit B12-FA  1 capsule Oral BID   melatonin  10 mg Oral QHS   metoprolol succinate  12.5 mg Oral Daily   tamsulosin  0.4 mg Oral QPC breakfast   Continuous Infusions:  sodium chloride Stopped (05/16/23 1959)   sodium chloride 125 mL/hr at 05/24/23 0148   cefTRIAXone (ROCEPHIN)  IV 1 g (05/23/23 1714)   PRN Meds: acetaminophen **OR** acetaminophen, ALPRAZolam, cyclobenzaprine, morphine injection, naLOXone (NARCAN)  injection, nitroGLYCERIN, ondansetron **OR** ondansetron (ZOFRAN) IV, oxyCODONE-acetaminophen, polyethylene glycol   Vital Signs    Vitals:   05/24/23 0409 05/24/23 0457 05/24/23 0754 05/24/23 1549  BP: (!) 150/81  125/68 134/81  Pulse: 91  (!) 106 81  Resp: 20  18 20   Temp: 98 F (36.7 C)  98 F (36.7 C) 98 F (36.7 C)  TempSrc: Oral     SpO2: 96%  100% 99%  Weight:  90 kg    Height:        Intake/Output Summary (Last 24 hours) at 05/24/2023 1618 Last data filed at 05/24/2023 1051 Gross per 24 hour  Intake 2767.44 ml  Output 1150 ml  Net 1617.44 ml      05/24/2023    4:57 AM 05/23/2023    3:59 AM 05/22/2023    5:53 AM  Last 3 Weights  Weight (lbs) 198 lb 6.6 oz  197 lb 12 oz 196 lb  Weight (kg) 90 kg 89.7 kg 88.905 kg      Telemetry    Normal sinus rhythm- Personally Reviewed  ECG     - Personally Reviewed  Physical Exam   GEN: No acute distress.   Neck: No JVD Cardiac: RRR, no murmurs, rubs, or gallops.  Respiratory: Clear to auscultation bilaterally. GI: Soft, nontender, non-distended  MS: No edema; No deformity. Neuro:  Nonfocal  Psych: Normal affect   Labs    High Sensitivity Troponin:   Recent Labs  Lab 05/13/23 2116 05/13/23 2308 05/16/23 1156 05/22/23 2313 05/23/23 0053  TROPONINIHS 29* 31* 65* 25* 28*     Chemistry Recent Labs  Lab 05/19/23 0638 05/23/23 0445 05/24/23 0345  NA 138 130* 132*  K 4.3 4.0 5.1  CL 105 102 104  CO2 25 20* 20*  GLUCOSE 100* 115* 152*  BUN 19 39* 42*  CREATININE 0.93 2.03* 2.58*  CALCIUM 8.9 8.8* 8.6*  GFRNONAA >60 35* 26*  ANIONGAP 8 8 8     Lipids No results for input(s): "CHOL", "TRIG", "HDL", "LABVLDL", "LDLCALC", "CHOLHDL" in the last 168 hours.  Hematology Recent Labs  Lab 05/22/23 0652 05/23/23 0445 05/24/23 0345  WBC 9.2 7.3 9.1  RBC 3.07* 2.87* 2.74*  HGB 9.5* 8.9* 8.4*  HCT  29.0* 27.1* 26.3*  MCV 94.5 94.4 96.0  MCH 30.9 31.0 30.7  MCHC 32.8 32.8 31.9  RDW 16.3* 16.1* 15.9*  PLT 223 201 164   Thyroid No results for input(s): "TSH", "FREET4" in the last 168 hours.  BNPNo results for input(s): "BNP", "PROBNP" in the last 168 hours.  DDimer No results for input(s): "DDIMER" in the last 168 hours.   Radiology    No results found.  Cardiac Studies     Patient Profile     67 y.o. male with a past medical history of kidney stones, coronary disease status post recent NSTEMI with PCI to the pLAD and PCI/DES to the RCA (05/01/2023), HFrEF (30-35%), ischemic cardiomyopathy, hiatal hernia, and nephrolithiasis, has been seen and evaluated for gross hematuria with the need for dual antiplatelet therapy status post NSTEMI with stent placement.   Assessment & Plan     Symptomatic anemia with continued gross hematuria -Hemoglobin with slight drop 8.4, continues to have hematuria -Has received 3 transfusions thus far -Continued to pass clots clogging his Foley catheter He has been evaluated by urology, patient declining stent placement   High-sensitivity troponin elevated with recent NSTEMI/CAD -High-sensitivity troponins 65 suspect supply demand mismatch -Status post PCI/DES 3 weeks ago PCI to the pLAD and PCI/DES to the RCA -Continued on clopidogrel Toprol and statin therapy -Aspirin discontinued yesterday for continued hematuria No further cardiac intervention needed   HFrEF/ICM Appears euvolemic on exam -LVEF 30-35% on echocardiogram -Toprol and losartan held  due to low blood pressures Blood pressures possibly in the setting of pain medication -No SGLT2 inhibitor due to UTI and hematuria Not a blood pressure stabilized, will restart low-dose beta-blocker  ARB on hold in the setting of worsening renal failure   Hydronephrosis of left kidney/staghorn calculus/acute renal failure -Continues to be followed by urology Declining stent placement Will hold off on adding ARB   [facility] will sign off.   Medication Recommendations: No further medication changes Other recommendations (labs, testing, etc): No further cardiac testing Follow up as an outpatient: Will follow-up as outpatient   Long discussion with him, he is declining stent placement, Very concerned about adequate pain control  Total encounter time more than 50 minutes  Greater than 50% was spent in counseling and coordination of care with the patient    For questions or updates, please contact  [facility] Please consult [email or web address] for contact info under        Signed, Julien Nordmann, MD  05/24/2023, 4:18 PM

## 2023-05-24 NOTE — TOC Progression Note (Signed)
Transition of Care [facility]) - Progression Note    Patient Details  Name: Ronnie Mann MRN: [id number] Date of Birth: 02-04-1956  [facility] CM/SW Contact  Chapman Fitch, RN Phone Number: 05/24/2023, 2:00 PM  Clinical Narrative:     TOC continues to follow for discharge needs.   Expected Discharge Plan: Home/Self Care Barriers to Discharge: Continued Medical Work up  Expected Discharge Plan and Services     Post Acute Care Choice: NA Living arrangements for the past 2 months: Single Family Home                                       Social Determinants of Health (SDOH) Interventions SDOH Screenings   Food Insecurity: No Food Insecurity (05/16/2023)  Housing: Low Risk  (05/16/2023)  Transportation Needs: No Transportation Needs (05/16/2023)  Utilities: Not At Risk (05/16/2023)  Tobacco Use: Low Risk  (05/17/2023)    Readmission Risk Interventions     No data to display

## 2023-05-24 NOTE — Consult Note (Signed)
[facility]  CONSULT NOTE    Date: 05/24/2023                  Patient Name:  Ronnie Mann  MRN: [id number]  DOB: Jul 31, 1956  Age / Sex: 67 y.o., male         PCP: Pcp, No                 Service Requesting Consult: TRH                 Reason for Consult: Acute kidney injury            History of Present Illness: Mr. Ronnie Mann is a 67 y.o.  male with past medical conditions including NSTEMI, heart failure and nephrolithiasis, who was admitted to [facility] on 05/16/2023 for Gross hematuria [R31.0] Symptomatic anemia [D64.9] Hematuria, unspecified type [R31.9]  Patient presented to ED with shortness of breath, dysuria and hematuria.  CT abdomen pelvis shows a moderate right hydronephrosis within the upper pole or calyx.  Also reports a left hydronephrosis with stable calculus in the proximal left ureter.  Urology consulted.  Patient refuses stent placement.  Reports sharp abdominal and lower back pain.  States he prefer insist on being broken up versus stent placement.  Creatinine 2.03 on ED arrival found increased to 2.5 reports some urinary retention requiring in and out cath.   Medications: Outpatient medications: Medications Prior to Admission  Medication Sig Dispense Refill Last Dose   ALPRAZolam (XANAX) 0.5 MG tablet Take 1 tablet (0.5 mg total) by mouth 3 (three) times daily as needed for anxiety. 20 tablet 0 Past Week   aspirin EC 81 MG tablet Take 1 tablet (81 mg total) by mouth daily. Swallow whole. 30 tablet 12 Past Week   atorvastatin (LIPITOR) 40 MG tablet Take 1 tablet (40 mg total) by mouth daily. 30 tablet 0 Past Week   [EXPIRED] oxyCODONE-acetaminophen (PERCOCET) 5-325 MG tablet Take 1 tablet by mouth every 6 (six) hours as needed for up to 3 days for severe pain. 10 tablet 0 Past Week   tamsulosin (FLOMAX) 0.4 MG CAPS capsule Take 1 capsule (0.4 mg total) by mouth daily after breakfast. 30 capsule 0 Past Week   ticagrelor (BRILINTA) 90 MG TABS  tablet Take 1 tablet (90 mg total) by mouth 2 (two) times daily. 60 tablet 0 Past Week    Current medications: Current Facility-Administered Medications  Medication Dose Route Frequency Provider Last Rate Last Admin   0.9 %  sodium chloride infusion   Intravenous Once Arnetha Courser, MD   Held at 05/16/23 1959   0.9 %  sodium chloride infusion   Intravenous Continuous Loyce Dys, MD 125 mL/hr at 05/24/23 0148 New Bag at 05/24/23 0148   acetaminophen (TYLENOL) tablet 650 mg  650 mg Oral Q6H PRN Arnetha Courser, MD   650 mg at 05/16/23 2122   Or   acetaminophen (TYLENOL) suppository 650 mg  650 mg Rectal Q6H PRN Arnetha Courser, MD       ALPRAZolam Prudy Feeler) tablet 0.5 mg  0.5 mg Oral TID PRN Arnetha Courser, MD   0.5 mg at 05/24/23 0146   atorvastatin (LIPITOR) tablet 40 mg  40 mg Oral Daily Arnetha Courser, MD   40 mg at 05/24/23 1001   cefTRIAXone (ROCEPHIN) 1 g in sodium chloride 0.9 % 100 mL IVPB  1 g Intravenous Q24H Rosezetta Schlatter T, MD 200 mL/hr at 05/23/23 1714 1 g at 05/23/23  1714   Chlorhexidine Gluconate Cloth 2 % PADS 6 each  6 each Topical Daily End, Christopher, MD   6 each at 05/24/23 1002   clopidogrel (PLAVIX) tablet 75 mg  75 mg Oral Daily End, Christopher, MD   75 mg at 05/24/23 1001   cyclobenzaprine (FLEXERIL) tablet 10 mg  10 mg Oral TID PRN Loyce Dys, MD   10 mg at 05/24/23 1512   Fe Fum-Vit C-Vit B12-FA (TRIGELS-F FORTE) capsule 1 capsule  1 capsule Oral BID Arnetha Courser, MD   1 capsule at 05/24/23 1001   melatonin tablet 10 mg  10 mg Oral QHS Andris Baumann, MD   10 mg at 05/23/23 2227   metoprolol succinate (TOPROL-XL) 24 hr tablet 12.5 mg  12.5 mg Oral Daily End, Christopher, MD   12.5 mg at 05/24/23 1001   morphine (PF) 2 MG/ML injection 2 mg  2 mg Intravenous Q4H PRN Rosezetta Schlatter T, MD   2 mg at 05/24/23 1429   naloxone (NARCAN) injection 0.4 mg  0.4 mg Intravenous PRN Gillis Santa, MD       nitroGLYCERIN (NITROSTAT) SL tablet 0.4 mg  0.4 mg Sublingual Q5 min PRN  Mansy, Jan A, MD   0.4 mg at 05/22/23 2318   ondansetron (ZOFRAN) tablet 4 mg  4 mg Oral Q6H PRN Arnetha Courser, MD       Or   ondansetron (ZOFRAN) injection 4 mg  4 mg Intravenous Q6H PRN Arnetha Courser, MD       oxyCODONE-acetaminophen (PERCOCET/ROXICET) 5-325 MG per tablet 1 tablet  1 tablet Oral Q4H PRN Gillis Santa, MD   1 tablet at 05/24/23 0124   polyethylene glycol (MIRALAX / GLYCOLAX) packet 17 g  17 g Oral Daily PRN Arnetha Courser, MD   17 g at 05/18/23 0816   tamsulosin (FLOMAX) capsule 0.4 mg  0.4 mg Oral QPC breakfast Arnetha Courser, MD   0.4 mg at 05/24/23 0827      Allergies: No Known Allergies    Past Medical History: Past Medical History:  Diagnosis Date   Coronary artery disease 05/01/2023   PCI/DES   HFrEF (heart failure with reduced ejection fraction) (HCC) 05/01/2023   Hiatal hernia    Ischemic cardiomyopathy 05/01/2023   LVEF 30-35%   Kidney stones      Past Surgical History: Past Surgical History:  Procedure Laterality Date   CORONARY STENT INTERVENTION N/A 05/01/2023   Procedure: CORONARY STENT INTERVENTION;  Surgeon: Iran Ouch, MD;  Location: [facility] INVASIVE CV LAB;  Service: Cardiovascular;  Laterality: N/A;   LEFT HEART CATH AND CORONARY ANGIOGRAPHY N/A 05/01/2023   Procedure: LEFT HEART CATH AND CORONARY ANGIOGRAPHY;  Surgeon: Iran Ouch, MD;  Location: [facility] INVASIVE CV LAB;  Service: Cardiovascular;  Laterality: N/A;     Family History: History reviewed. No pertinent family history.   Social History: Social History   Socioeconomic History   Marital status: Divorced    Spouse name: Not on file   Number of children: Not on file   Years of education: Not on file   Highest education level: Not on file  Occupational History   Not on file  Tobacco Use   Smoking status: Never   Smokeless tobacco: Never  Substance and Sexual Activity   Alcohol use: Never   Drug use: Never   Sexual activity: Not on file  Other Topics Concern    Not on file  Social History Narrative   Not on file  Social Determinants of Health   Financial Resource Strain: Not on file  Food Insecurity: No Food Insecurity (05/16/2023)   Hunger Vital Sign    Worried About Running Out of Food in the Last Year: Never true    Ran Out of Food in the Last Year: Never true  Transportation Needs: No Transportation Needs (05/16/2023)   PRAPARE - Administrator, Civil Service (Medical): No    Lack of Transportation (Non-Medical): No  Physical Activity: Not on file  Stress: Not on file  Social Connections: Not on file  Intimate Partner Violence: Not At Risk (05/16/2023)   Humiliation, Afraid, Rape, and Kick questionnaire    Fear of Current or Ex-Partner: No    Emotionally Abused: No    Physically Abused: No    Sexually Abused: No     Review of Systems: Review of Systems  Constitutional:  Negative for chills, fever and malaise/fatigue.  HENT:  Negative for congestion, sore throat and tinnitus.   Eyes:  Negative for blurred vision and redness.  Respiratory:  Positive for shortness of breath. Negative for cough and wheezing.   Cardiovascular:  Negative for chest pain, palpitations, claudication and leg swelling.  Gastrointestinal:  Negative for blood in stool, diarrhea, nausea and vomiting.  Genitourinary:  Positive for dysuria, flank pain and hematuria. Negative for frequency.  Musculoskeletal:  Negative for back pain, falls and myalgias.  Skin:  Negative for rash.  Neurological:  Negative for dizziness, weakness and headaches.  Endo/Heme/Allergies:  Does not bruise/bleed easily.  Psychiatric/Behavioral:  Negative for depression. The patient is not nervous/anxious and does not have insomnia.     Vital Signs: Blood pressure 125/68, pulse (!) 106, temperature 98 F (36.7 C), resp. rate 18, height 5\' 9"  (1.753 m), weight 90 kg, SpO2 100 %.  Weight trends: Filed Weights   05/22/23 0553 05/23/23 0359 05/24/23 0457  Weight: 88.9 kg 89.7  kg 90 kg    Physical Exam: General: NAD  Head: Normocephalic, atraumatic. Moist oral mucosal membranes  Eyes: Anicteric  Lungs:  Clear to auscultation normal effort  Heart: Regular rate and rhythm  Abdomen:  Soft, nontender  Extremities: No peripheral edema.  Neurologic: Alert and oriented, moving all four extremities  Skin: No lesions  Access: None     Lab results: Basic Metabolic Panel: Recent Labs  Lab 05/19/23 0638 05/23/23 0445 05/24/23 0345  NA 138 130* 132*  K 4.3 4.0 5.1  CL 105 102 104  CO2 25 20* 20*  GLUCOSE 100* 115* 152*  BUN 19 39* 42*  CREATININE 0.93 2.03* 2.58*  CALCIUM 8.9 8.8* 8.6*    Liver Function Tests: No results for input(s): "AST", "ALT", "ALKPHOS", "BILITOT", "PROT", "ALBUMIN" in the last 168 hours. No results for input(s): "LIPASE", "AMYLASE" in the last 168 hours. No results for input(s): "AMMONIA" in the last 168 hours.  CBC: Recent Labs  Lab 05/20/23 0629 05/21/23 0517 05/22/23 0652 05/23/23 0445 05/24/23 0345  WBC 6.7 7.2 9.2 7.3 9.1  NEUTROABS  --   --   --  5.3 8.0*  HGB 8.8* 9.3* 9.5* 8.9* 8.4*  HCT 27.5* 28.5* 29.0* 27.1* 26.3*  MCV 94.2 95.0 94.5 94.4 96.0  PLT 207 211 223 201 164    Cardiac Enzymes: No results for input(s): "CKTOTAL", "CKMB", "CKMBINDEX", "TROPONINI" in the last 168 hours.  BNP: Invalid input(s): "POCBNP"  CBG: Recent Labs  Lab 05/21/23 0836 05/22/23 0931 05/23/23 0833 05/23/23 1511 05/24/23 0742  GLUCAP 127* 133* 122* 169* 110*  Microbiology: Results for orders placed or performed during the hospital encounter of 05/16/23  Urine Culture     Status: None   Collection Time: 05/16/23 11:56 AM   Specimen: Urine, Clean Catch  Result Value Ref Range Status   Specimen Description   Final    URINE, CLEAN CATCH Performed at [facility], [address], [place], [place] [address]    Special Requests   Final    NONE Performed at [facility], [address],  [place], [place] [address]    Culture   Final    NO GROWTH Performed at [facility], [address], [place], [place] [address]    Report Status 05/17/2023 FINAL  Final    Coagulation Studies: No results for input(s): "LABPROT", "INR" in the last 72 hours.  Urinalysis: No results for input(s): "COLORURINE", "LABSPEC", "PHURINE", "GLUCOSEU", "HGBUR", "BILIRUBINUR", "KETONESUR", "PROTEINUR", "UROBILINOGEN", "NITRITE", "LEUKOCYTESUR" in the last 72 hours.  Invalid input(s): "APPERANCEUR"    Imaging: No results found.   Assessment & Plan: Mr. TEJAN ZUHLKE is a 67 y.o.  male with past medical conditions including NSTEMI, heart failure and nephrolithiasis, who was admitted to [facility] on 05/16/2023 for Gross hematuria [R31.0] Symptomatic anemia [D64.9] Hematuria, unspecified type [R31.9]  Acute kidney injury likely secondary to hydronephrosis seen on CT abdomen pelvis.  Current UTI may also contribute.  Renal function noted in June.  Urology consulted and recommending stent placement.  Patient refuses stent placement due to previous complications with prior stents.  Will defer to urology management.  2.  Hyponatremia likely secondary to kidney injury.  Currently receiving normal saline at 125 mL/h.  3. Anemia due to kidney injury and presence of staghorn calculus Lab Results  Component Value Date   HGB 8.4 (L) 05/24/2023  Blood transfusions received during this admission. Hemoglobin below desired target.  Will monitor for now.    LOS: 8 Jereme Loren 7/3/20243:20 PM

## 2023-05-24 NOTE — Care Management Important Message (Signed)
Important Message  Patient Details  Name: Ronnie Mann MRN: [id number] Date of Birth: 12/17/1955   Medicare Important Message Given:  Yes     [name] 05/24/2023, 10:49 AM

## 2023-05-24 NOTE — Telephone Encounter (Signed)
Pt scheduled for 05/30/23

## 2023-05-24 NOTE — Progress Notes (Signed)
Progress Note   Patient: Ronnie Mann ZOX:[id number] DOB: [birth year] DOA: 05/16/2023     8 DOS: the patient was seen and examined on 05/24/2023    Subjective:  Patient seen and examined at bedside Still able to make urine and is not having any retention on bladder ultrasound Urology recommended stent placement however patient opted to follow-up as an outpatient Nephrologist consulted Denies nausea vomiting chest pain cough   Brief hospital course: Ronnie Mann is a 67 y.o. male with medical history significant of recent NSTEMI, s/p PCI to proximal LAD and RCA on 05/01/23, HFrEF(EF 30 to 35%), and nephrolithiasis presented to ED with shortness of breath, bilateral flank pain radiating towards lower abdomen, more on right than left, dysuria and gross hematuria.   Patient with history of bilateral nephrolithiasis, symptoms consistent with symptomatic anemia and renal colic.  Hemoglobin 7.5.   Patient did received a dose of ceftriaxone in ED which was not continued.  Less likely UTI.   Urology was consulted and 2 unit of PRBC ordered."     Assessment and plan # Gross hematuria # Symptomatic anemia due to acute blood loss/hematuria Iron profile, folate and B12 within normal range Patient with history of chronic nephrolithiasis and most recent CT abdomen showing staghorn and obstructing stones.  Symptoms are  consistent with renal colic.  Recently placed on DAPT with aspirin and Brilinta.   -Urology consult-recommending close outpatient follow-up for possible cystoscopy and ureteral stent placement. -s/p Total 3 units of PRBC transfusion -keep hemoglobin above 8 with recent NSTEMI. -Renal ultrasound-with persistent severe left hydronephrosis with chronic renal cortical scarring/thinning.  Nonobstructive right staghorn calculus I have discontinued the Dilaudid Have encouraged patient that we will be switching to oral pain medication soon as he is nearing discharge I discussed the case with  urology team as well as nephrology today Urologist recommends continuation of Flomax  Foley catheter has been taken out by urologist Voiding trial showed no significant urinary retention Urologist recommends 2 days of empiric IV antibiotics for UTI prevention He will complete antibiotics by 05/25/2023   Continue Plavix per cardiologist recommendation in the setting of recent NSTEMI and stent placement Aspirin discontinued by cardiologist  # Staghorn calculus Patient was found to have right-sided staghorn calculus and left-sided hydronephrosis with a obstructing stone on most recent CT renal stone study done on 05/13/2023.  Might be contributory to gross hematuria.  Renal ultrasound confirmed. -Urology consult-recommending close outpatient follow-up.  Will be high risk for definitive treatment of this type of stones due to being on DAPT and recent NSTEMI We will continue management as above    # Hydronephrosis of left kidney Patient has a chronic stable left renal hydronephrosis with marked renal cortical thinning due to an obstructing proximal left ureteral calculus. Urology is on board and is recommending stent placement however patient refused   Acute renal failure worsening renal function Patient's creatinine is worsening today again likely secondary to above After extensive discussion with the patient by urologist patient does not want to have any more urologic procedures including stenting I also confirmed this with the patient I am not sure nephrologist able to offer any additional supports but I have consulted them and case discussed  continue to monitor renal function  # Elevated troponin Patient with history of recent NSTEMI, s/p cardiac catheterization and PCI to proximal LAD and RCA on 05/01/2023. Slowly rising troponin 29>>65 -Cardiology switch patient's Brilinta to Plavix Continue on Plavix, Toprol as well as statin therapy  Aspirin has been discontinued by  cardiologist Continue to keep hemoglobin above 8   # Ischemic cardiomyopathy Recent diagnosis of acute systolic heart failure with EF of 30 to 35% with NSTEMI.  S/p cardiac cath and PCI to LAD and RCA. Cardiologist on board and recommended-switchingBrilinta with Plavix. Continue statin and carvedilol Monitor input and output Monitor daily weight   # Acute hyperglycemia Patient was found to have blood glucose above 200, also noted to have elevated blood glucose level during most recent admissions and A1c was checked and it was 5.5. Patient was placed on SGL 2 inhibitor on discharge. Continue to monitor glucose closely   # UTI symptoms UA with bacteriuria.  Patient has bilateral renal colic.  Recent UA was also positive for pyuria and bacteriuria but urine cultures were sterile. Urologist recommended two days IV antibiotics for UTI prevention     Diet: Heart healthy diet DVT Prophylaxis: SCD, pharmacological prophylaxis contraindicated due to hematuria     Advance goals of care discussion: Full code     Physical Exam: General: NAD, lying comfortably Appear in no distress, affect appropriate Eyes: PERRLA ENT: Oral Mucosa Clear, moist  Neck: no JVD,  Cardiovascular: S1 and S2 Present, no Murmur,  Respiratory: good respiratory effort, Bilateral Air entry equal and Decreased, no Crackles, no wheezes Abdomen: Bowel Sound present, Soft and no tenderness,  Skin: no rashes Extremities: no Pedal edema, no calf tenderness Neurologic: without any new focal findings Gait not checked due to patient safety concerns   Data reviewed: I have discussed the case with urologist, nephrologist, and I have reviewed the patient's laboratory data showing slightly decreased hemoglobin 8.4 Sodium 132 potassium 5.1 creatinine 2.58 which is worsening  Vitals:   05/23/23 1942 05/24/23 0409 05/24/23 0457 05/24/23 0754  BP: 128/66 (!) 150/81  125/68  Pulse: 78 91  (!) 106  Resp: 20 20  18   Temp: 98 F  (36.7 C) 98 F (36.7 C)  98 F (36.7 C)  TempSrc: Oral Oral    SpO2: 100% 96%  100%  Weight:   90 kg   Height:         Author: Loyce Dys, MD 05/24/2023 3:48 PM  For on call review [email or web address].

## 2023-05-24 NOTE — Progress Notes (Signed)
Urology Consult Follow Up  Subjective: He is complaining of right-sided flank pain that radiates into the right groin.  He states his pain has been present for the last 4 days.  He does not feel that his pain has been managed adequately.  Looking at his MAR, he received 1 mg of Dilaudid at 10:27 pm yesterday at 4:24 and at 8:26 this morning, 2 mg of morphine at 5:06 PM yesterday and was given Percocet 5/325 at 5:42 PM 11 PM yesterday and that 1:21 AM this morning.  VSS afebrile w/ good UOP  Serum creatinine up to 2.58 from 2.03 yesterday, WBC count of 9.1, hemoglobin of 8.4, hematocrit of 26.3  He continues to have gross heme and passage of clots, but he is emptying his bladder adequately.  He had a bladder scan this am which noted 75 cc and then he immediately voided in the urinal the same amount.     Anti-infectives: Anti-infectives (From admission, onward)    Start     Dose/Rate Route Frequency Ordered Stop   05/23/23 1700  cefTRIAXone (ROCEPHIN) 1 g in sodium chloride 0.9 % 100 mL IVPB        1 g 200 mL/hr over 30 Minutes Intravenous Every 24 hours 05/23/23 1601     05/16/23 1700  cefTRIAXone (ROCEPHIN) 1 g in sodium chloride 0.9 % 100 mL IVPB        1 g 200 mL/hr over 30 Minutes Intravenous  Once 05/16/23 1652 05/16/23 1814       Current Facility-Administered Medications  Medication Dose Route Frequency Provider Last Rate Last Admin   0.9 %  sodium chloride infusion   Intravenous Once Arnetha Courser, MD   Held at 05/16/23 1959   0.9 %  sodium chloride infusion   Intravenous Continuous Loyce Dys, MD 125 mL/hr at 05/24/23 0148 New Bag at 05/24/23 0148   acetaminophen (TYLENOL) tablet 650 mg  650 mg Oral Q6H PRN Arnetha Courser, MD   650 mg at 05/16/23 2122   Or   acetaminophen (TYLENOL) suppository 650 mg  650 mg Rectal Q6H PRN Arnetha Courser, MD       ALPRAZolam Prudy Feeler) tablet 0.5 mg  0.5 mg Oral TID PRN Arnetha Courser, MD   0.5 mg at 05/24/23 0146   atorvastatin (LIPITOR)  tablet 40 mg  40 mg Oral Daily Arnetha Courser, MD   40 mg at 05/23/23 0934   cefTRIAXone (ROCEPHIN) 1 g in sodium chloride 0.9 % 100 mL IVPB  1 g Intravenous Q24H Rosezetta Schlatter T, MD 200 mL/hr at 05/23/23 1714 1 g at 05/23/23 1714   Chlorhexidine Gluconate Cloth 2 % PADS 6 each  6 each Topical Daily End, Cristal Deer, MD   6 each at 05/23/23 0935   clopidogrel (PLAVIX) tablet 75 mg  75 mg Oral Daily End, Christopher, MD   75 mg at 05/23/23 0935   cyclobenzaprine (FLEXERIL) tablet 10 mg  10 mg Oral TID PRN Loyce Dys, MD       Fe Fum-Vit C-Vit B12-FA (TRIGELS-F FORTE) capsule 1 capsule  1 capsule Oral BID Arnetha Courser, MD   1 capsule at 05/23/23 2229   HYDROmorphone (DILAUDID) injection 1 mg  1 mg Intravenous Q4H PRN Otelia Sergeant, RPH   1 mg at 05/24/23 0424   melatonin tablet 10 mg  10 mg Oral QHS Andris Baumann, MD   10 mg at 05/23/23 2227   metoprolol succinate (TOPROL-XL) 24 hr tablet 12.5 mg  12.5 mg  Oral Daily End, Cristal Deer, MD   12.5 mg at 05/22/23 0827   morphine (PF) 2 MG/ML injection 2 mg  2 mg Intravenous Q4H PRN Loyce Dys, MD   2 mg at 05/23/23 1906   naloxone [facility]) injection 0.4 mg  0.4 mg Intravenous PRN Gillis Santa, MD       nitroGLYCERIN (NITROSTAT) SL tablet 0.4 mg  0.4 mg Sublingual Q5 min PRN Mansy, Jan A, MD   0.4 mg at 05/22/23 2318   ondansetron (ZOFRAN) tablet 4 mg  4 mg Oral Q6H PRN Arnetha Courser, MD       Or   ondansetron (ZOFRAN) injection 4 mg  4 mg Intravenous Q6H PRN Arnetha Courser, MD       oxyCODONE-acetaminophen (PERCOCET/ROXICET) 5-325 MG per tablet 1 tablet  1 tablet Oral Q4H PRN Gillis Santa, MD   1 tablet at 05/24/23 0124   polyethylene glycol (MIRALAX / GLYCOLAX) packet 17 g  17 g Oral Daily PRN Arnetha Courser, MD   17 g at 05/18/23 0816   tamsulosin (FLOMAX) capsule 0.4 mg  0.4 mg Oral QPC breakfast Arnetha Courser, MD   0.4 mg at 05/23/23 0935     Objective: Vital signs in last 24 hours: Temp:  [97.8 F (36.6 C)-98 F (36.7 C)] 98 F  (36.7 C) (07/03 0409) Pulse Rate:  [69-91] 91 (07/03 0409) Resp:  [16-20] 20 (07/03 0409) BP: (81-150)/(47-92) 150/81 (07/03 0409) SpO2:  [96 %-100 %] 96 % (07/03 0409) Weight:  [90 kg] 90 kg (07/03 0457)  Intake/Output from previous day: 07/02 0701 - 07/03 0700 In: 2647.4 [P.O.:360; I.V.:2187.4; IV Piggyback:100] Out: 1200 [Urine:1200] Intake/Output this shift: No intake/output data recorded.   Physical Exam Constitutional:      General: He is not in acute distress.    Appearance: He is well-developed. He is obese. He is not ill-appearing, toxic-appearing or diaphoretic.  HENT:     Head: Normocephalic and atraumatic.     Mouth/Throat:     Mouth: Mucous membranes are moist.     Pharynx: Oropharynx is clear.  Eyes:     Extraocular Movements: Extraocular movements intact.     Pupils: Pupils are equal, round, and reactive to light.  Pulmonary:     Effort: Pulmonary effort is normal.  Abdominal:     Palpations: Abdomen is soft. There is no mass.     Tenderness: There is no abdominal tenderness. There is no guarding or rebound.  Musculoskeletal:        General: Normal range of motion.     Cervical back: Normal range of motion.  Skin:    General: Skin is warm and dry.  Neurological:     General: No focal deficit present.     Mental Status: He is alert and oriented to person, place, and time.  Psychiatric:        Mood and Affect: Mood normal.        Behavior: Behavior is not agitated.     Lab Results:  Recent Labs    05/23/23 0445 05/24/23 0345  WBC 7.3 9.1  HGB 8.9* 8.4*  HCT 27.1* 26.3*  PLT 201 164   BMET Recent Labs    05/23/23 0445 05/24/23 0345  NA 130* 132*  K 4.0 5.1  CL 102 104  CO2 20* 20*  GLUCOSE 115* 152*  BUN 39* 42*  CREATININE 2.03* 2.58*  CALCIUM 8.8* 8.6*   PT/INR No results for input(s): "LABPROT", "INR" in the last 72 hours. ABG No  results for input(s): "PHART", "HCO3" in the last 72 hours.  Invalid input(s): "PCO2",  "PO2"  Studies/Results: No results found.   Assessment: 67 year old comorbid male with recent NSTEMI s/p PCI to proximal LAD and RCA on DAPT with aspirin and Plavix, HFrEF and bilateral partial staghorn stones and a chronic proximal left ureteral stone with left hydronephrosis and left renal atrophy and moderate right hydronephrosis with hyperdense material seen in the dilated upper pole calyces and intermittently extending into the proximal right ureter which is likely a blood clot now admitted with symptomatic anemia due to gross hematuria.  His creatinine continues to increase, but he adamantly refuses stent placement and is desiring to be discharged from the hospital.  Hospital team will also ask nephrology their opinion.    Plan: -Going forward, we have limited options.  He continues to refuse stent placement citing that he has poorly tolerated them in the past and he has concerns that the hospital would not be able to manage his pain once he had the stents in place.   Placing stents would also aggravate the hematuria.  Percutaneous nephrostomy tube placement is also not an option with the DAPT and recent cardiac stent placement.  -PCNL is also not advisable secondary to the anticoagulation -His only option may be to undergo staged ureteroscopy which will require stents -At this time, with his comorbidities and his refusing stent placement, we will plan on seeing him in clinic early next week for further discussion regarding definitive stone management    LOS: 8 days    [facility] 05/24/2023

## 2023-05-25 DIAGNOSIS — R310 Gross hematuria: Secondary | ICD-10-CM | POA: Diagnosis not present

## 2023-05-25 LAB — CBC WITH DIFFERENTIAL/PLATELET
Abs Immature Granulocytes: 0.02 10*3/uL (ref 0.00–0.07)
Basophils Absolute: 0 10*3/uL (ref 0.0–0.1)
Basophils Relative: 1 %
Eosinophils Absolute: 0.2 10*3/uL (ref 0.0–0.5)
Eosinophils Relative: 4 %
HCT: 21.3 % — ABNORMAL LOW (ref 39.0–52.0)
Hemoglobin: 6.8 g/dL — ABNORMAL LOW (ref 13.0–17.0)
Immature Granulocytes: 1 %
Lymphocytes Relative: 18 %
Lymphs Abs: 0.8 10*3/uL (ref 0.7–4.0)
MCH: 30.8 pg (ref 26.0–34.0)
MCHC: 31.9 g/dL (ref 30.0–36.0)
MCV: 96.4 fL (ref 80.0–100.0)
Monocytes Absolute: 0.4 10*3/uL (ref 0.1–1.0)
Monocytes Relative: 10 %
Neutro Abs: 2.9 10*3/uL (ref 1.7–7.7)
Neutrophils Relative %: 66 %
Platelets: 134 10*3/uL — ABNORMAL LOW (ref 150–400)
RBC: 2.21 MIL/uL — ABNORMAL LOW (ref 4.22–5.81)
RDW: 16 % — ABNORMAL HIGH (ref 11.5–15.5)
WBC: 4.3 10*3/uL (ref 4.0–10.5)
nRBC: 0 % (ref 0.0–0.2)

## 2023-05-25 LAB — BASIC METABOLIC PANEL
Anion gap: 6 (ref 5–15)
BUN: 38 mg/dL — ABNORMAL HIGH (ref 8–23)
CO2: 19 mmol/L — ABNORMAL LOW (ref 22–32)
Calcium: 7.9 mg/dL — ABNORMAL LOW (ref 8.9–10.3)
Chloride: 110 mmol/L (ref 98–111)
Creatinine, Ser: 2.07 mg/dL — ABNORMAL HIGH (ref 0.61–1.24)
GFR, Estimated: 34 mL/min — ABNORMAL LOW (ref >60)
Glucose, Bld: 88 mg/dL (ref 70–99)
Potassium: 4.2 mmol/L (ref 3.5–5.1)
Sodium: 135 mmol/L (ref 135–145)

## 2023-05-25 LAB — TYPE AND SCREEN: ABO/RH(D): A POS

## 2023-05-25 LAB — GLUCOSE, CAPILLARY: Glucose-Capillary: 84 mg/dL (ref 70–99)

## 2023-05-25 LAB — HEMOGLOBIN AND HEMATOCRIT, BLOOD
HCT: 27.6 % — ABNORMAL LOW (ref 39.0–52.0)
Hemoglobin: 8.9 g/dL — ABNORMAL LOW (ref 13.0–17.0)

## 2023-05-25 LAB — PREPARE RBC (CROSSMATCH)

## 2023-05-25 MED ORDER — ORAL CARE MOUTH RINSE: 15.0000 mL | OROMUCOSAL | Status: DC | PRN

## 2023-05-25 MED ORDER — SODIUM CHLORIDE 0.9% IV SOLUTION: Freq: Once | INTRAVENOUS | Status: DC

## 2023-05-25 NOTE — Progress Notes (Signed)
Progress Note   Patient: Ronnie Mann ZOX:[id number] DOB: [birth year] DOA: 05/16/2023     9 DOS: the patient was seen and examined on 05/25/2023      Subjective:  Patient seen and examined at bedside this morning Denies nausea vomiting abdominal pain Still having some blood-tinged urine Hemoglobin is down today to 6.8 Renal function have improved   Brief hospital course: Ronnie Mann is a 67 y.o. male with medical history significant of recent NSTEMI, s/p PCI to proximal LAD and RCA on 05/01/23, HFrEF(EF 30 to 35%), and nephrolithiasis presented to ED with shortness of breath, bilateral flank pain radiating towards lower abdomen, more on right than left, dysuria and gross hematuria.   Patient with history of bilateral nephrolithiasis, symptoms consistent with symptomatic anemia and renal colic.  Hemoglobin 7.5.   Patient did received a dose of ceftriaxone in ED which was not continued.  Less likely UTI.   Urology was consulted and 2 unit of PRBC ordered."     Assessment and plan # Gross hematuria # Symptomatic anemia due to acute blood loss/hematuria Iron profile, folate and B12 within normal range Patient with history of chronic nephrolithiasis and most recent CT abdomen showing staghorn and obstructing stones.  Symptoms are  consistent with renal colic.  Recently placed on DAPT with aspirin and Brilinta.   -Urology consult-recommending close outpatient follow-up for possible cystoscopy and ureteral stent placement. -s/p Total 3 units of PRBC transfusion -keep hemoglobin above 8 with recent NSTEMI. -Renal ultrasound-with persistent severe left hydronephrosis with chronic renal cortical scarring/thinning.  Nonobstructive right staghorn calculus I have discontinued the Dilaudid Have encouraged patient that we will be switching to oral pain medication soon as he is nearing discharge I discussed the case with urology team as well as nephrology today Urologist recommends continuation of  Flomax  Foley catheter has been taken out by urologist Voiding trial showed no significant urinary retention Urologist recommends 2 days of empiric IV antibiotics for UTI prevention He will complete antibiotics by 05/25/2023 We will give another unit of blood transfusion today  Continue Plavix per cardiologist recommendation in the setting of recent NSTEMI and stent placement Aspirin discontinued by cardiologist   # Staghorn calculus Patient was found to have right-sided staghorn calculus and left-sided hydronephrosis with a obstructing stone on most recent CT renal stone study done on 05/13/2023.  Might be contributory to gross hematuria.  Renal ultrasound confirmed. -Urology consult-recommending close outpatient follow-up.  Will be high risk for definitive treatment of this type of stones due to being on DAPT and recent NSTEMI Continue above management   # Hydronephrosis of left kidney Patient has a chronic stable left renal hydronephrosis with marked renal cortical thinning due to an obstructing proximal left ureteral calculus. Urology is on board and is recommending stent placement however patient refused   Acute renal failure worsening renal function Patient's creatinine is worsening today again likely secondary to above After extensive discussion with the patient by urologist patient does not want to have any more urologic procedures including stenting I also confirmed this with the patient I am not sure nephrologist able to offer any additional supports but I have consulted them and case discussed Continue to monitor renal function   # Elevated troponin Patient with history of recent NSTEMI, s/p cardiac catheterization and PCI to proximal LAD and RCA on 05/01/2023. Slowly rising troponin 29>>65 -Cardiology switch patient's Brilinta to Plavix Continue on Plavix, Toprol as well as statin therapy Aspirin has been discontinued by  cardiologist If hemoglobin above 8   # Ischemic  cardiomyopathy Recent diagnosis of acute systolic heart failure with EF of 30 to 35% with NSTEMI.  S/p cardiac cath and PCI to LAD and RCA. Cardiologist on board and recommended-switchingBrilinta with Plavix. Continue statin and carvedilol Monitor daily weight Monitor input and output   # Acute hyperglycemia Patient was found to have blood glucose above 200, also noted to have elevated blood glucose level during most recent admissions and A1c was checked and it was 5.5. Patient was placed on SGL 2 inhibitor on discharge. Continue to monitor glucose closely   # UTI symptoms UA with bacteriuria.  Patient has bilateral renal colic.  Recent UA was also positive for pyuria and bacteriuria but urine cultures were sterile. Urologist recommended two days IV antibiotics for UTI prevention     Diet: Heart healthy diet DVT Prophylaxis: SCD, pharmacological prophylaxis contraindicated due to hematuria         Physical Exam: General: Sitting in bed in no acute distress Appear in no distress, affect appropriate Eyes: PERRLA ENT: Oral Mucosa Clear, moist  Neck: no JVD,  Cardiovascular: S1 and S2 Present, no Murmur,  Respiratory: good respiratory effort, Bilateral Air entry equal and Decreased, no Crackles, no wheezes Abdomen: Bowel Sound present, Soft and no tenderness,  Skin: no rashes Extremities: No pedal edema Neurologic: without any new focal findings Gait not checked due to patient safety concerns   Data reviewed: Have reviewed patient's lab data as well as nurses documentation cardiologist and nephrologist documentation   Vitals:   05/25/23 0734 05/25/23 1510 05/25/23 1541 05/25/23 1609  BP: (!) 100/56 (!) 142/71 137/72 (!) 114/56  Pulse: (!) 54 80 76 62  Resp: 17 17 18 15   Temp: (!) 97.5 F (36.4 C) 98.3 F (36.8 C) 98.4 F (36.9 C) 97.7 F (36.5 C)  TempSrc: Oral Oral Oral   SpO2: 100% 100% 100% 98%  Weight:      Height:         Author: Loyce Dys, MD 05/25/2023  5:08 PM  For on call review [email or web address].

## 2023-05-26 DIAGNOSIS — R310 Gross hematuria: Secondary | ICD-10-CM | POA: Diagnosis not present

## 2023-05-26 LAB — BPAM RBC
Blood Product Expiration Date: 202408022359
ISSUE DATE / TIME: 202407041515
Unit Type and Rh: 6200

## 2023-05-26 LAB — BASIC METABOLIC PANEL
Anion gap: 7 (ref 5–15)
BUN: 37 mg/dL — ABNORMAL HIGH (ref 8–23)
CO2: 20 mmol/L — ABNORMAL LOW (ref 22–32)
Calcium: 8.4 mg/dL — ABNORMAL LOW (ref 8.9–10.3)
Chloride: 110 mmol/L (ref 98–111)
Creatinine, Ser: 2.05 mg/dL — ABNORMAL HIGH (ref 0.61–1.24)
GFR, Estimated: 35 mL/min — ABNORMAL LOW (ref >60)
Glucose, Bld: 114 mg/dL — ABNORMAL HIGH (ref 70–99)
Potassium: 4.5 mmol/L (ref 3.5–5.1)
Sodium: 137 mmol/L (ref 135–145)

## 2023-05-26 LAB — CBC WITH DIFFERENTIAL/PLATELET
Abs Immature Granulocytes: 0.06 10*3/uL (ref 0.00–0.07)
Basophils Absolute: 0 10*3/uL (ref 0.0–0.1)
Basophils Relative: 0 %
Eosinophils Absolute: 0.1 10*3/uL (ref 0.0–0.5)
Eosinophils Relative: 1 %
HCT: 24.8 % — ABNORMAL LOW (ref 39.0–52.0)
Hemoglobin: 8 g/dL — ABNORMAL LOW (ref 13.0–17.0)
Immature Granulocytes: 1 %
Lymphocytes Relative: 7 %
Lymphs Abs: 0.5 10*3/uL — ABNORMAL LOW (ref 0.7–4.0)
MCH: 30.8 pg (ref 26.0–34.0)
MCHC: 32.3 g/dL (ref 30.0–36.0)
MCV: 95.4 fL (ref 80.0–100.0)
Monocytes Absolute: 0.7 10*3/uL (ref 0.1–1.0)
Monocytes Relative: 9 %
Neutro Abs: 5.8 10*3/uL (ref 1.7–7.7)
Neutrophils Relative %: 82 %
Platelets: 141 10*3/uL — ABNORMAL LOW (ref 150–400)
RBC: 2.6 MIL/uL — ABNORMAL LOW (ref 4.22–5.81)
RDW: 15.7 % — ABNORMAL HIGH (ref 11.5–15.5)
WBC: 7.1 10*3/uL (ref 4.0–10.5)
nRBC: 0 % (ref 0.0–0.2)

## 2023-05-26 LAB — GLUCOSE, CAPILLARY: Glucose-Capillary: 109 mg/dL — ABNORMAL HIGH (ref 70–99)

## 2023-05-26 LAB — TYPE AND SCREEN
Antibody Screen: NEGATIVE
Unit division: 0

## 2023-05-26 NOTE — Care Management Important Message (Signed)
Important Message  Patient Details  Name: Ronnie Mann MRN: [id number] Date of Birth: 1956-05-04   Medicare Important Message Given:  Yes     [name] 05/26/2023, 11:33 AM

## 2023-05-26 NOTE — Progress Notes (Signed)
Progress Note   Patient: Ronnie Mann:[id number] DOB: [birth year] DOA: 05/16/2023     10 DOS: the patient was seen and examined on 05/26/2023      Subjective:  Patient seen and examined this morning Did have an episode of abdominal pain overnight Hemoglobin has improved after blood transfusion Denies nausea vomiting chest pain cough Renal function continues to improve   Brief hospital course: Ronnie Mann is a 67 y.o. male with medical history significant of recent NSTEMI, s/p PCI to proximal LAD and RCA on 05/01/23, HFrEF(EF 30 to 35%), and nephrolithiasis presented to ED with shortness of breath, bilateral flank pain radiating towards lower abdomen, more on right than left, dysuria and gross hematuria.   Patient with history of bilateral nephrolithiasis, symptoms consistent with symptomatic anemia and renal colic.  Hemoglobin 7.5.   Patient did received a dose of ceftriaxone in ED which was not continued.  Less likely UTI.   Urology was consulted and 2 unit of PRBC ordered."     Assessment and plan # Gross hematuria # Symptomatic anemia due to acute blood loss/hematuria Iron profile, folate and B12 within normal range Patient with history of chronic nephrolithiasis and most recent CT abdomen showing staghorn and obstructing stones.  Symptoms are  consistent with renal colic.  Recently placed on DAPT with aspirin and Brilinta.   Urology on board recommended stent placement however patient is not agreeable to stent placement at this time -s/p Total 4 units of PRBC transfusion -keep hemoglobin above 8 with recent NSTEMI. -Renal ultrasound-with persistent severe left hydronephrosis with chronic renal cortical scarring/thinning.  Nonobstructive right staghorn calculus I have discontinued the Dilaudid We will give IV morphine as needed for severe pain otherwise we will use only oral pain medication as patient nears discharge Plan of care discussed with nephrologist Continue  Flomax Patient patient with no postvoid residual obstruction Will complete antibiotics by 05/27/2023 We will monitor CBC closely   Continue Plavix per cardiologist recommendation in the setting of recent NSTEMI and stent placement Aspirin discontinued by cardiologist in the setting of persistent hematuria   # Staghorn calculus Patient was found to have right-sided staghorn calculus and left-sided hydronephrosis with a obstructing stone on most recent CT renal stone study done on 05/13/2023.  Might be contributory to gross hematuria.  Renal ultrasound confirmed. -Urology consult-recommending close outpatient follow-up.  Will be high risk for definitive treatment of this type of stones due to being on DAPT and recent NSTEMI Continue above management   # Hydronephrosis of left kidney Patient has a chronic stable left renal hydronephrosis with marked renal cortical thinning due to an obstructing proximal left ureteral calculus. Urology is on board and is recommending stent placement however patient refused   Acute renal failure worsening renal function Improving Nephrology on board Monitor renal function closely    # Elevated troponin Patient with history of recent NSTEMI, s/p cardiac catheterization and PCI to proximal LAD and RCA on 05/01/2023. Slowly rising troponin 29>>65 -Cardiology switch patient's Brilinta to Plavix Continue on Plavix, Toprol as well as statin therapy Aspirin has been discontinued by cardiologist Keep Hb above 8   # Ischemic cardiomyopathy Recent diagnosis of acute systolic heart failure with EF of 30 to 35% with NSTEMI.  S/p cardiac cath and PCI to LAD and RCA. Cardiologist on board and recommended-switchingBrilinta with Plavix. Continue statin and carvedilol Monitor daily weight Monitor input and output   # Acute hyperglycemia Patient was found to have blood glucose above 200,  also noted to have elevated blood glucose level during most recent admissions and  A1c was checked and it was 5.5. Patient was placed on SGL 2 inhibitor on discharge. Continue to monitor glucose closely   # UTI symptoms UA with bacteriuria.  Patient has bilateral renal colic.  Recent UA was also positive for pyuria and bacteriuria but urine cultures were sterile. Urologist recommended two days IV antibiotics for UTI prevention     Diet: Heart healthy diet DVT Prophylaxis: SCD, pharmacological prophylaxis contraindicated due to hematuria         Physical Exam: General: Sitting in bed in no acute distress Appear in no distress, affect appropriate Eyes: PERRLA ENT: Oral Mucosa Clear, moist  Neck: no JVD,  Cardiovascular: S1 and S2 Present, no Murmur,  Respiratory: good respiratory effort, Bilateral Air entry equal and Decreased, no Crackles, no wheezes Abdomen: Bowel Sound present, Soft and no tenderness,  Skin: no rashes Extremities: No pedal edema Neurologic: without any new focal findings Gait not checked due to patient safety concerns   Data reviewed: I have reviewed documentation by cardiologist, nursing staff, patient's lab results  Vitals:   05/25/23 1927 05/26/23 0436 05/26/23 0436 05/26/23 0727  BP: (!) 142/82  123/66 (!) 117/58  Pulse: 90  92 64  Resp: 20  20 15   Temp: 98.2 F (36.8 C)  98.2 F (36.8 C) 97.7 F (36.5 C)  TempSrc: Oral  Oral   SpO2: 99%  97% 98%  Weight:  90.3 kg    Height:       \  Author: Loyce Dys, MD 05/26/2023 3:06 PM  For on call review [email or web address].

## 2023-05-27 DIAGNOSIS — R310 Gross hematuria: Secondary | ICD-10-CM | POA: Diagnosis not present

## 2023-05-27 LAB — CBC WITH DIFFERENTIAL/PLATELET
Abs Immature Granulocytes: 0.01 10*3/uL (ref 0.00–0.07)
Basophils Absolute: 0 10*3/uL (ref 0.0–0.1)
Basophils Relative: 1 %
Eosinophils Absolute: 0.3 10*3/uL (ref 0.0–0.5)
Eosinophils Relative: 4 %
HCT: 27.7 % — ABNORMAL LOW (ref 39.0–52.0)
Hemoglobin: 8.9 g/dL — ABNORMAL LOW (ref 13.0–17.0)
Immature Granulocytes: 0 %
Lymphocytes Relative: 13 %
Lymphs Abs: 0.8 10*3/uL (ref 0.7–4.0)
MCH: 31.1 pg (ref 26.0–34.0)
MCHC: 32.1 g/dL (ref 30.0–36.0)
MCV: 96.9 fL (ref 80.0–100.0)
Monocytes Absolute: 0.6 10*3/uL (ref 0.1–1.0)
Monocytes Relative: 9 %
Neutro Abs: 4.4 10*3/uL (ref 1.7–7.7)
Neutrophils Relative %: 73 %
Platelets: 145 10*3/uL — ABNORMAL LOW (ref 150–400)
RBC: 2.86 MIL/uL — ABNORMAL LOW (ref 4.22–5.81)
RDW: 15.5 % (ref 11.5–15.5)
WBC: 6 10*3/uL (ref 4.0–10.5)
nRBC: 0 % (ref 0.0–0.2)

## 2023-05-27 LAB — GLUCOSE, CAPILLARY
Glucose-Capillary: 95 mg/dL (ref 70–99)
Glucose-Capillary: 108 mg/dL — ABNORMAL HIGH (ref 70–99)

## 2023-05-27 LAB — BASIC METABOLIC PANEL
Anion gap: 8 (ref 5–15)
BUN: 35 mg/dL — ABNORMAL HIGH (ref 8–23)
CO2: 20 mmol/L — ABNORMAL LOW (ref 22–32)
Calcium: 8.6 mg/dL — ABNORMAL LOW (ref 8.9–10.3)
Chloride: 107 mmol/L (ref 98–111)
Creatinine, Ser: 2.35 mg/dL — ABNORMAL HIGH (ref 0.61–1.24)
GFR, Estimated: 30 mL/min — ABNORMAL LOW (ref >60)
Glucose, Bld: 106 mg/dL — ABNORMAL HIGH (ref 70–99)
Potassium: 4.6 mmol/L (ref 3.5–5.1)
Sodium: 135 mmol/L (ref 135–145)

## 2023-05-27 MED ORDER — POLYETHYLENE GLYCOL 3350 17 G PO PACK: 17.0000 g | PACK | Freq: Every day | ORAL | 0 refills | Status: DC | PRN

## 2023-05-27 MED ORDER — ALPRAZOLAM 0.5 MG PO TABS: 0.5000 mg | ORAL_TABLET | Freq: Two times a day (BID) | ORAL | 0 refills | Status: DC | PRN

## 2023-05-27 MED ORDER — METOPROLOL SUCCINATE ER 25 MG PO TB24: 12.5000 mg | ORAL_TABLET | Freq: Every day | ORAL | 0 refills | Status: DC

## 2023-05-27 MED ORDER — SODIUM BICARBONATE 650 MG PO TABS: 1300.0000 mg | ORAL_TABLET | Freq: Two times a day (BID) | ORAL | 0 refills | Status: DC

## 2023-05-27 MED ORDER — OXYCODONE-ACETAMINOPHEN 5-325 MG PO TABS: 2.0000 | ORAL_TABLET | Freq: Three times a day (TID) | ORAL | 0 refills | Status: AC | PRN

## 2023-05-27 MED ORDER — FE FUM-VIT C-VIT B12-FA 460-60-0.01-1 MG PO CAPS: 1.0000 | ORAL_CAPSULE | Freq: Two times a day (BID) | ORAL | 0 refills | Status: DC

## 2023-05-27 MED ORDER — CLOPIDOGREL BISULFATE 75 MG PO TABS: 75.0000 mg | ORAL_TABLET | Freq: Every day | ORAL | 0 refills | Status: DC

## 2023-05-27 NOTE — Progress Notes (Signed)
[facility]  ROUNDING NOTE   Subjective:   Patient seen resting quietly in bed Alert and oriented Main concern is pain management  Creatinine 2.35 Urine output 1.2 L in preceding 24 hours Gross hematuria noted in urinal  Objective:  Vital signs in last 24 hours:  Temp:  [98 F (36.7 C)-98.5 F (36.9 C)] 98.5 F (36.9 C) (07/06 0732) Pulse Rate:  [57-64] 64 (07/06 0732) Resp:  [17-19] 18 (07/06 0732) BP: (114-126)/(55-69) 122/69 (07/06 0732) SpO2:  [97 %-99 %] 99 % (07/06 0732) Weight:  [92.3 kg] 92.3 kg (07/06 0358)  Weight change: 2 kg Filed Weights   05/25/23 0350 05/26/23 0436 05/27/23 0358  Weight: 90.2 kg 90.3 kg 92.3 kg    Intake/Output: I/O last 3 completed shifts: In: 920 [P.O.:720; IV Piggyback:200] Out: 1825 [Urine:1825]   Intake/Output this shift:  Total I/O In: -  Out: 800 [Urine:800]  Physical Exam: General: NAD  Head: Normocephalic, atraumatic. Moist oral mucosal membranes  Eyes: Anicteric  Lungs:  Clear to auscultation  Heart: Regular rate and rhythm  Abdomen:  Soft, nontender  Extremities: No peripheral edema.  Neurologic: Alert and oriented, moving all four extremities  Skin: No lesions  Access: None    Basic Metabolic Panel: Recent Labs  Lab 05/23/23 0445 05/24/23 0345 05/25/23 0558 05/26/23 0730 05/27/23 0438  NA 130* 132* 135 137 135  K 4.0 5.1 4.2 4.5 4.6  CL 102 104 110 110 107  CO2 20* 20* 19* 20* 20*  GLUCOSE 115* 152* 88 114* 106*  BUN 39* 42* 38* 37* 35*  CREATININE 2.03* 2.58* 2.07* 2.05* 2.35*  CALCIUM 8.8* 8.6* 7.9* 8.4* 8.6*    Liver Function Tests: No results for input(s): "AST", "ALT", "ALKPHOS", "BILITOT", "PROT", "ALBUMIN" in the last 168 hours. No results for input(s): "LIPASE", "AMYLASE" in the last 168 hours. No results for input(s): "AMMONIA" in the last 168 hours.  CBC: Recent Labs  Lab 05/23/23 0445 05/24/23 0345 05/25/23 0558 05/25/23 2116 05/26/23 0730 05/27/23 0438  WBC 7.3  9.1 4.3  --  7.1 6.0  NEUTROABS 5.3 8.0* 2.9  --  5.8 4.4  HGB 8.9* 8.4* 6.8* 8.9* 8.0* 8.9*  HCT 27.1* 26.3* 21.3* 27.6* 24.8* 27.7*  MCV 94.4 96.0 96.4  --  95.4 96.9  PLT 201 164 134*  --  141* 145*    Cardiac Enzymes: No results for input(s): "CKTOTAL", "CKMB", "CKMBINDEX", "TROPONINI" in the last 168 hours.  BNP: Invalid input(s): "POCBNP"  CBG: Recent Labs  Lab 05/23/23 1511 05/24/23 0742 05/25/23 0736 05/26/23 0730 05/27/23 0738  GLUCAP 169* 110* 84 109* 95    Microbiology: Results for orders placed or performed during the hospital encounter of 05/16/23  Urine Culture     Status: None   Collection Time: 05/16/23 11:56 AM   Specimen: Urine, Clean Catch  Result Value Ref Range Status   Specimen Description   Final    URINE, CLEAN CATCH Performed at [facility], [address], [place], [place] [address]    Special Requests   Final    NONE Performed at [facility], [address], [place], [place] [address]    Culture   Final    NO GROWTH Performed at [facility], [address], [place], [place] [address]    Report Status 05/17/2023 FINAL  Final    Coagulation Studies: No results for input(s): "LABPROT", "INR" in the last 72 hours.  Urinalysis: No results for input(s): "COLORURINE", "LABSPEC", "PHURINE", "  GLUCOSEU", "HGBUR", "BILIRUBINUR", "KETONESUR", "PROTEINUR", "UROBILINOGEN", "NITRITE", "LEUKOCYTESUR" in the last 72 hours.  Invalid input(s): "APPERANCEUR"    Imaging: No results found.   Medications:    cefTRIAXone (ROCEPHIN)  IV 200 mL/hr at 05/27/23 0306    sodium chloride   Intravenous Once   atorvastatin  40 mg Oral Daily   clopidogrel  75 mg Oral Daily   Fe Fum-Vit C-Vit B12-FA  1 capsule Oral BID   melatonin  10 mg Oral QHS   metoprolol succinate  12.5 mg Oral Daily   sodium bicarbonate  1,300 mg Oral BID   tamsulosin  0.4 mg Oral QPC breakfast   acetaminophen **OR** acetaminophen, ALPRAZolam,  cyclobenzaprine, morphine injection, naLOXone (NARCAN)  injection, nitroGLYCERIN, ondansetron **OR** ondansetron (ZOFRAN) IV, mouth rinse, oxyCODONE-acetaminophen, polyethylene glycol  Assessment/ Plan:  Ronnie Mann is a 67 y.o.  male with past medical conditions including NSTEMI, heart failure and nephrolithiasis, who was admitted to [facility] on 05/16/2023 for Gross hematuria [R31.0] Symptomatic anemia [D64.9] Hematuria, unspecified type [R31.9]   Acute kidney injury likely secondary to hydronephrosis seen on CT abdomen pelvis. Current UTI may also contribute. Renal function noted in June. Urology consulted and recommending stent placement. Patient refuses stent placement due to previous complications with prior stents.   Creatinine has worsened, adequate urine output noted.  No acute indication for dialysis.  Patient will need to follow-up in our office at discharge.  Will defer management of renal calculi to urology.  Lab Results  Component Value Date   CREATININE 2.35 (H) 05/27/2023   CREATININE 2.05 (H) 05/26/2023   CREATININE 2.07 (H) 05/25/2023    Intake/Output Summary (Last 24 hours) at 05/27/2023 0943 Last data filed at 05/27/2023 4098 Gross per 24 hour  Intake 200 ml  Output 2000 ml  Net -1800 ml   2.  Hyponatremia likely secondary to kidney injury.  Corrected with IV fluids  3. Anemia of chronic kidney disease Lab Results  Component Value Date   HGB 8.9 (L) 05/27/2023    Hemoglobin improving slowly.  Patient has received blood transfusions during this admission.   LOS: 11 [name] 7/6/20249:43 AM

## 2023-05-27 NOTE — Plan of Care (Signed)
Adequate for discharge  Rowan Pollman V Halena Mohar  

## 2023-05-27 NOTE — Discharge Summary (Signed)
Physician Discharge Summary   Patient: Ronnie Mann MRN: [id number] DOB: [birth year]  Admit date:     05/16/2023  Discharge date: 05/27/23  Discharge Physician: Loyce Dys   PCP: Pcp, No     Discharge Diagnoses:  # Gross hematuria # Symptomatic anemia due to acute blood loss/hematuria # Staghorn calculus # Hydronephrosis of left kidneY Acute renal failure worsening renal function # Elevated troponin # Ischemic cardiomyopathy # Acute hyperglycemia   Hospital Course: Ronnie Mann is a 67 y.o. male with medical history significant of recent NSTEMI, s/p PCI to proximal LAD and RCA on 05/01/23, HFrEF(EF 30 to 35%), and nephrolithiasis presented to ED with shortness of breath, bilateral flank pain radiating towards lower abdomen, more on right than left, dysuria and gross hematuria. Patient with history of bilateral nephrolithiasis, symptoms consistent with symptomatic anemia and renal colic.  Patient was seen by urologist and recommended for stent placement patient refused and opted for conservative management and to follow-up with urology at the clinic.  He required Foley catheter placement with improvement in his hematuria and Foley catheter was taken out residual urine check was satisfactory and patient is not having any retention and able to pee with no issues was also seen by cardiologist and Brilinta was switched to Plavix, aspirin discontinued in the setting of hematuria by cardiologist patient also found to have increased creatinine and was seen by nephrology team and cleared for discharge by urology and nephrology and to follow-up as an outpatient.   Consultants: Cardiology, urology, nephrology Procedures performed: Foley catheterization and removal Disposition: Home Diet recommendation:  Cardiac diet DISCHARGE MEDICATION: Allergies as of 05/27/2023   No Known Allergies      Medication List     STOP taking these medications    aspirin EC 81 MG tablet   ticagrelor 90 MG  Tabs tablet Commonly known as: BRILINTA       TAKE these medications    ALPRAZolam 0.5 MG tablet Commonly known as: XANAX Take 1 tablet (0.5 mg total) by mouth 3 (three) times daily as needed for anxiety.   atorvastatin 40 MG tablet Commonly known as: LIPITOR Take 1 tablet (40 mg total) by mouth daily.   clopidogrel 75 MG tablet Commonly known as: PLAVIX Take 1 tablet (75 mg total) by mouth daily. Start taking on: May 28, 2023   Fe Fum-Vit C-Vit B12-FA Caps capsule Commonly known as: TRIGELS-F FORTE Take 1 capsule by mouth 2 (two) times daily.   metoprolol succinate 25 MG 24 hr tablet Commonly known as: TOPROL-XL Take 0.5 tablets (12.5 mg total) by mouth daily. Start taking on: May 28, 2023   oxyCODONE-acetaminophen 5-325 MG tablet Commonly known as: Percocet Take 2 tablets by mouth every 8 (eight) hours as needed for up to 3 days for severe pain. What changed:  how much to take when to take this   polyethylene glycol 17 g packet Commonly known as: MIRALAX / GLYCOLAX Take 17 g by mouth daily as needed for mild constipation.   sodium bicarbonate 650 MG tablet Take 2 tablets (1,300 mg total) by mouth 2 (two) times daily.   tamsulosin 0.4 MG Caps capsule Commonly known as: FLOMAX Take 1 capsule (0.4 mg total) by mouth daily after breakfast.        Follow-up Information     Gollan, Tollie Pizza, MD. Schedule an appointment as soon as possible for a visit.   Specialty: Cardiology Contact information: [address] [place] [address] [phone]  Sondra Come, MD. Schedule an appointment as soon as possible for a visit.   Specialty: Urology Contact information: [address] [place] [address] [phone]         Mosetta Pigeon, MD. Schedule an appointment as soon as possible for a visit.   Specialty: Nephrology Contact information: [address] [place] [address] [phone]                 Discharge Exam: Ronnie Mann Weights   05/25/23 0350 05/26/23 0436 05/27/23 0358  Weight: 90.2 kg 90.3 kg 92.3 kg   Physical Exam: General: Sitting in bed in no acute distress Appear in no distress, affect appropriate Eyes: PERRLA ENT: Oral Mucosa Clear, moist  Neck: no JVD,  Cardiovascular: S1 and S2 Present, no Murmur,  Respiratory: good respiratory effort, Bilateral Air entry equal and Decreased, no Crackles, no wheezes Abdomen: Bowel Sound present, Soft and no tenderness,  Skin: no rashes Extremities: No pedal edema Neurologic: without any new focal findings Gait not checked due to patient safety concerns  Condition at discharge: good   Discharge time spent: 37 minutes  Signed: Loyce Dys, MD [facility] 05/27/2023

## 2023-05-27 NOTE — Progress Notes (Signed)
Mobility Specialist - Progress Note    05/27/23 1158  Mobility  Activity Ambulated independently in hallway  Level of Assistance Independent  Assistive Device Front wheel walker  Distance Ambulated (ft) 180 ft  Range of Motion/Exercises Active  Activity Response Tolerated well  Mobility Referral Yes  $Mobility charge 1 Mobility  Mobility Specialist Start Time (ACUTE ONLY) 1143  Mobility Specialist Stop Time (ACUTE ONLY) 1158  Mobility Specialist Time Calculation (min) (ACUTE ONLY) 15 min   Pt resting in bed on RA upon entry. Pt STS and ambulates to hallway around NS with RW. Pt returned to bed and left with needs in reach.   Johnathan Hausen Mobility Specialist 05/27/23, 12:01 PM

## 2023-05-27 NOTE — Plan of Care (Signed)
  Problem: Education: Goal: Understanding of CV disease, CV risk reduction, and recovery process will improve Outcome: Progressing Goal: Individualized Educational Video(s) Outcome: Progressing   Problem: Activity: Goal: Ability to return to baseline activity level will improve Outcome: Progressing   Problem: Cardiovascular: Goal: Ability to achieve and maintain adequate cardiovascular perfusion will improve Outcome: Progressing Goal: Vascular access site(s) Level 0-1 will be maintained Outcome: Progressing   Problem: Education: Goal: Ability to demonstrate management of disease process will improve Outcome: Progressing Goal: Ability to verbalize understanding of medication therapies will improve Outcome: Progressing Goal: Individualized Educational Video(s) Outcome: Progressing   Problem: Activity: Goal: Capacity to carry out activities will improve Outcome: Progressing   Problem: Cardiac: Goal: Ability to achieve and maintain adequate cardiopulmonary perfusion will improve Outcome: Progressing   Problem: Education: Goal: Knowledge of General Education information will improve Description: Including pain rating scale, medication(s)/side effects and non-pharmacologic comfort measures Outcome: Progressing   Problem: Health Behavior/Discharge Planning: Goal: Ability to manage health-related needs will improve Outcome: Progressing   Problem: Clinical Measurements: Goal: Ability to maintain clinical measurements within normal limits will improve Outcome: Progressing Goal: Will remain free from infection Outcome: Progressing Goal: Diagnostic test results will improve Outcome: Progressing Goal: Respiratory complications will improve Outcome: Progressing Goal: Cardiovascular complication will be avoided Outcome: Progressing   Problem: Activity: Goal: Risk for activity intolerance will decrease Outcome: Progressing   Problem: Nutrition: Goal: Adequate nutrition will  be maintained Outcome: Progressing   Problem: Coping: Goal: Level of anxiety will decrease Outcome: Progressing   Problem: Elimination: Goal: Will not experience complications related to bowel motility Outcome: Progressing Goal: Will not experience complications related to urinary retention Outcome: Progressing   Problem: Pain Managment: Goal: General experience of comfort will improve Outcome: Progressing   Problem: Safety: Goal: Ability to remain free from injury will improve Outcome: Progressing   Problem: Skin Integrity: Goal: Risk for impaired skin integrity will decrease Outcome: Progressing

## 2023-05-27 NOTE — Progress Notes (Signed)
AVS discussed and provided to patient. IV removed. No complaints at this time.  

## 2023-05-30 ENCOUNTER — Encounter: Payer: Self-pay | Admitting: Urology

## 2023-05-30 ENCOUNTER — Other Ambulatory Visit
Admission: RE | Admit: 2023-05-30 | Discharge: 2023-05-30 | Disposition: A | Discharge status: Home / Self Care | Payer: Medicare HMO | Attending: Urology | Admitting: Urology

## 2023-05-30 ENCOUNTER — Ambulatory Visit (INDEPENDENT_AMBULATORY_CARE_PROVIDER_SITE_OTHER): Payer: Medicare HMO | Admitting: Urology

## 2023-05-30 VITALS — BP 120/68 | HR 72 | Ht 69.0 in | Wt 208.0 lb

## 2023-05-30 DIAGNOSIS — N200 Calculus of kidney: Secondary | ICD-10-CM | POA: Diagnosis not present

## 2023-05-30 DIAGNOSIS — R310 Gross hematuria: Secondary | ICD-10-CM

## 2023-05-30 LAB — URINALYSIS, COMPLETE (UACMP) WITH MICROSCOPIC: RBC / HPF: >50 RBC/hpf (ref 0–5)

## 2023-05-30 LAB — BASIC METABOLIC PANEL
Anion gap: 6 (ref 5–15)
BUN: 35 mg/dL — ABNORMAL HIGH (ref 8–23)
CO2: 25 mmol/L (ref 22–32)
Calcium: 8.9 mg/dL (ref 8.9–10.3)
Chloride: 102 mmol/L (ref 98–111)
Creatinine, Ser: 1.34 mg/dL — ABNORMAL HIGH (ref 0.61–1.24)
GFR, Estimated: 58 mL/min — ABNORMAL LOW (ref >60)
Glucose, Bld: 112 mg/dL — ABNORMAL HIGH (ref 70–99)
Potassium: 4.3 mmol/L (ref 3.5–5.1)
Sodium: 133 mmol/L — ABNORMAL LOW (ref 135–145)

## 2023-05-30 LAB — HEMOGLOBIN AND HEMATOCRIT, BLOOD
HCT: 25.2 % — ABNORMAL LOW (ref 39.0–52.0)
Hemoglobin: 8.2 g/dL — ABNORMAL LOW (ref 13.0–17.0)

## 2023-05-30 MED ORDER — ALPRAZOLAM 0.5 MG PO TABS: 0.5000 mg | ORAL_TABLET | Freq: Every evening | ORAL | 0 refills | Status: DC | PRN

## 2023-05-30 MED ORDER — HYDROCODONE-ACETAMINOPHEN 5-325 MG PO TABS: 1.0000 | ORAL_TABLET | Freq: Four times a day (QID) | ORAL | 0 refills | Status: AC | PRN

## 2023-05-30 NOTE — Progress Notes (Signed)
   05/30/2023 4:14 PM   Ronnie Mann 13-Nov-1956 [id number]  Reason for visit: Follow up bilateral nephrolithiasis, gross hematuria  HPI: Very comorbid 67 year old male with prolonged recent hospital stay for bilateral nephrolithiasis and gross hematuria after recent NSTEMI on Plavix.  Cardiac catheterization was performed 05/01/2023, and per cardiology would like him to remain on anticoagulation for at least 1 month.  He was previously followed by Dr. Sheppard Penton.  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  He has a 4.5 cm right upper pole staghorn stone, originally had no right-sided hydronephrosis, but after starting anticoagulation developed gross hematuria, and most recent CT scan 05/21/2023 shows mild to moderate right-sided hydronephrosis with hyperdense material within the proximal ureter likely representing hematuria/clots causing obstruction, no ureteral stones.  He also had worsening renal function at that time with a creatinine up to 2.5, we offered a right ureteral stent at that time to improve renal function and potentially improve right-sided flank pain, but he deferred as he has not tolerated ureteral stents well in the past.  He was not a candidate for percutaneous nephrostomy tube in the setting of Plavix for recent NSTEMI.  During his prolonged hospitalization he required multiple blood transfusions, most recent hematocrit 27.7 on 05/27/2023.  Blood pressure today normal at 120/70, heart rate 72.  He does appear quite pale.  He denies any shortness of breath or chest pain.  He continues to have intermittent right-sided flank pain, but primary complaint is some dysuria and pain with passage of hematuria and clots.  I recommended a stat H/H, BMP, and UA and culture for further evaluation today.  I had another very frank conversation with the patient and his son about our limited options in the setting of  recent NSTEMI with cardiac stent placement and need for anticoagulation, his intolerance of ureteral stents, importance of repeat blood work, and stone treatment options.  With his significant stone burden bilaterally, I think he would be best suited with PCNL, but would need to be off anticoagulation.  At this time he does not have any right-sided ureteral stones that would explain his flank pain, but likely is having intermittent renal colic secondary to hematuria and clots from the right collecting system secondary to the large staghorn stone causing intermittent obstruction.  If his hematuria improves, anticipate his flank pain would resolve.  Extremely challenging scenario without many feasible options for immediate resolution.  I think if we can get him through the next few weeks and be able to stop Plavix, if his hematuria resolves can likely monitor staghorn stones at least for the short-term until felt to be cleared from a cardiac perspective for PCNL.  Surveillance would also be an option with his comorbidities and nonobstructing stones, though likely will ultimately become an issue at some point with surveillance alone.  -Using shared decision making, he would like to continue Flomax for the time being and repeat blood work today with close follow-up in 2 to 3 weeks for repeat blood work and symptom check -Return precautions were discussed extensively -Pain medications refilled -Referral also placed to [facility] for consideration of potentially bilateral PCNL in the setting of bilateral staghorn stones, left renal atrophy, especially in the setting of recent cardiac event and anticoagulation   Sondra Come, MD  [facility] [address], [address] [place], [place] [address] [phone]

## 2023-05-31 NOTE — Progress Notes (Deleted)
Advanced Heart Failure Clinic Note   Referring Physician: PCP: Pcp, No PCP-Cardiologist: Ronnie Nordmann, MD (saw during 06/24 admission)  HPI:   [name] is a 67 y/o male with a history of NSTEMI, s/p PCI to proximal LAD and RCA on 05/01/23, HFrEF(EF 30 to 35%), and bilateral nephrolithiasis   Admitted 04/28/23 due to new onset of substernal "vice like" substernal chest pain that radiated to his left arm. He reports ongoing shortness of breath associated with this. He also was awoken from sleep with this last night and had broken out into a cold sweat. Elevated lactic acid and elevated WBC at 14.4. 1 L of IVF given. Initial troponin was 54 rose to 234. 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 Developed flash pulmonary edema during left heart cath 06/10 and was treated with nitroglycerin drip. Diuretics held. Status post successful balloon angioplasty to the proximal LAD and drug-eluting stent placement to the proximal right coronary artery. Treated for pneumonia with antibiotics. Was in the ED 05/13/23 due to bilateral flank and suprapubic pain. CT renal was stable.   Admitted 05/16/23 due to shortness of breath, bilateral flank pain radiating towards lower abdomen, more on right than left, dysuria and gross hematuria. Patient deferred urology stent placement. Able to void after foley removed. Brilinta was switched to Plavix, aspirin discontinued in the setting of hematuria by cardiologist    Echo 04/30/23: EF 30-35% along with mild LVH and Grade II DD and moderate MR.   LHC 05/01/23:   Prox LAD to Mid LAD lesion is 99% stenosed.   Mid LAD lesion is 60% stenosed.   1st Diag lesion is 70% stenosed.   Prox RCA lesion is 95% stenosed.   Mid RCA lesion is 30% stenosed.   RPDA lesion is 60% stenosed.   Dist LAD lesion is 99% stenosed.   A drug-eluting stent was successfully placed using a STENT ONYX FRONTIER 4.0X15.   Balloon angioplasty was performed using a  BALLN [place] EUPHORA RX 2.75X20.   Post intervention, there is a 20% residual stenosis.   Post intervention, there is a 0% residual stenosis.   There is severe left ventricular systolic dysfunction.   LV end diastolic pressure is severely elevated.   There is moderate (3+) mitral regurgitation.  1.  Severe two-vessel coronary artery disease with subtotal occlusion of the proximal LAD, diffuse moderate mid LAD disease and severe distal LAD disease.  In addition, there is 95% in the proximal right coronary artery with faint right to left collaterals to the LAD.  The left circumflex has mild nonobstructive disease and OM 3 is large and reaches the apex. 2.  Severely reduced LV systolic function with an EF of 25%.  Severely elevated left ventricular end-diastolic pressure at 34 mmHg. 3.  Successful balloon angioplasty to the proximal LAD and drug-eluting stent placement to the proximal right coronary artery.  He presents today for his initial HF visit with a chief complaint of   Review of Systems: [y] = yes, [ ]  = no   General: Weight gain [ ] ; Weight loss [ ] ; Anorexia [ ] ; Fatigue [ ] ; Fever [ ] ; Chills [ ] ; Weakness [ ]   Cardiac: Chest pain/pressure [ ] ; Resting SOB [ ] ; Exertional SOB [ ] ; Orthopnea [ ] ; Pedal Edema [ ] ; Palpitations [ ] ; Syncope [ ] ; Presyncope [ ] ; Paroxysmal nocturnal dyspnea[ ]   Pulmonary: Cough [ ] ; Wheezing[ ] ; Hemoptysis[ ] ; Sputum [ ] ; Snoring [ ]   GI:  Vomiting[ ] ; Dysphagia[ ] ; Melena[ ] ; Hematochezia [ ] ; Heartburn[ ] ; Abdominal pain [ ] ; Constipation [ ] ; Diarrhea [ ] ; BRBPR [ ]   GU: Hematuria[ ] ; Dysuria [ ] ; Nocturia[ ]   Vascular: Pain in legs with walking [ ] ; Pain in feet with lying flat [ ] ; Non-healing sores [ ] ; Stroke [ ] ; TIA [ ] ; Slurred speech [ ] ;  Neuro: Headaches[ ] ; Vertigo[ ] ; Seizures[ ] ; Paresthesias[ ] ;Blurred vision [ ] ; Diplopia [ ] ; Vision changes [ ]   Ortho/Skin: Arthritis [ ] ; Joint pain [ ] ; Muscle pain [ ] ; Joint swelling [ ] ; Back Pain [ ] ; Rash  [ ]   Psych: Depression[ ] ; Anxiety[ ]   Heme: Bleeding problems [ ] ; Clotting disorders [ ] ; Anemia [ ]   Endocrine: Diabetes [ ] ; Thyroid dysfunction[ ]    Past Medical History:  Diagnosis Date   Coronary artery disease 05/01/2023   PCI/DES   HFrEF (heart failure with reduced ejection fraction) (HCC) 05/01/2023   Hiatal hernia    Ischemic cardiomyopathy 05/01/2023   LVEF 30-35%   Kidney stones     Current Outpatient Medications  Medication Sig Dispense Refill   ALPRAZolam (XANAX) 0.5 MG tablet Take 1 tablet (0.5 mg total) by mouth 2 (two) times daily as needed for anxiety. 20 tablet 0   ALPRAZolam (XANAX) 0.5 MG tablet Take 1 tablet (0.5 mg total) by mouth at bedtime as needed for anxiety. 7 tablet 0   atorvastatin (LIPITOR) 40 MG tablet Take 1 tablet (40 mg total) by mouth daily. 30 tablet 0   clopidogrel (PLAVIX) 75 MG tablet Take 1 tablet (75 mg total) by mouth daily. 30 tablet 0   Fe Fum-Vit C-Vit B12-FA (TRIGELS-F FORTE) CAPS capsule Take 1 capsule by mouth 2 (two) times daily. 30 capsule 0   HYDROcodone-acetaminophen (NORCO) 5-325 MG tablet Take 1 tablet by mouth every 6 (six) hours as needed for up to 7 days for severe pain. 20 tablet 0   metoprolol succinate (TOPROL-XL) 25 MG 24 hr tablet Take 0.5 tablets (12.5 mg total) by mouth daily. 30 tablet 0   polyethylene glycol (MIRALAX / GLYCOLAX) 17 g packet Take 17 g by mouth daily as needed for mild constipation. 14 each 0   sodium bicarbonate 650 MG tablet Take 2 tablets (1,300 mg total) by mouth 2 (two) times daily. 60 tablet 0   tamsulosin (FLOMAX) 0.4 MG CAPS capsule Take 1 capsule (0.4 mg total) by mouth daily after breakfast. 30 capsule 0   No current facility-administered medications for this visit.    No Known Allergies    Social History   Socioeconomic History   Marital status: Divorced    Spouse name: Not on file   Number of children: Not on file   Years of education: Not on file   Highest education level: Not  on file  Occupational History   Not on file  Tobacco Use   Smoking status: Never    Passive exposure: Never   Smokeless tobacco: Never  Substance and Sexual Activity   Alcohol use: Never   Drug use: Never   Sexual activity: Not on file  Other Topics Concern   Not on file  Social History Narrative   Not on file   Social Determinants of Health   Financial Resource Strain: Not on file  Food Insecurity: No Food Insecurity (05/16/2023)   Hunger Vital Sign    Worried About Running Out of Food in the Last Year: Never true    Ran Out of Food  in the Last Year: Never true  Transportation Needs: No Transportation Needs (05/16/2023)   PRAPARE - Administrator, Civil Service (Medical): No    Lack of Transportation (Non-Medical): No  Physical Activity: Not on file  Stress: Not on file  Social Connections: Not on file  Intimate Partner Violence: Not At Risk (05/16/2023)   Humiliation, Afraid, Rape, and Kick questionnaire    Fear of Current or Ex-Partner: No    Emotionally Abused: No    Physically Abused: No    Sexually Abused: No     No family history on file.     PHYSICAL EXAM: General:  Well appearing. No respiratory difficulty HEENT: normal Neck: supple. no JVD. Carotids 2+ bilat; no bruits. No lymphadenopathy or thyromegaly appreciated. Cor: PMI nondisplaced. Regular rate & rhythm. No rubs, gallops or murmurs. Lungs: clear Abdomen: soft, nontender, nondistended. No hepatosplenomegaly. No bruits or masses. Good bowel sounds. Extremities: no cyanosis, clubbing, rash, edema Neuro: alert & oriented x 3, cranial nerves grossly intact. moves all 4 extremities w/o difficulty. Affect pleasant.  ECG:   ASSESSMENT & PLAN:  1: Ischemic heart failure with reduced ejection fraction- - - NYHA class   Delma Freeze, FNP 05/31/23

## 2023-06-01 ENCOUNTER — Telehealth: Payer: Self-pay | Admitting: Family

## 2023-06-01 ENCOUNTER — Encounter: Payer: Medicare HMO | Admitting: Family

## 2023-06-01 ENCOUNTER — Telehealth: Payer: Self-pay | Admitting: Cardiovascular Disease

## 2023-06-01 LAB — URINE CULTURE: Culture: NO GROWTH

## 2023-06-01 NOTE — Telephone Encounter (Signed)
Please advise on refill. This medication was prescribed by hospitalist on recent hospital discharge.  Patient has not seen outpatient but does have hospital f/u on 07/11/23.  Thanks

## 2023-06-01 NOTE — Telephone Encounter (Signed)
*  STAT* If patient is at the pharmacy, call can be transferred to refill team.   1. Which medications need to be refilled? (please list name of each medication and dose if known)   sodium bicarbonate 650 MG tablet    2. Which pharmacy/location (including street and city if local pharmacy) is medication to be sent to?[facility] [address] [place] - [address]   3. Do they need a 30 day or 90 day supply? 90 day

## 2023-06-01 NOTE — Telephone Encounter (Signed)
Patient did not show for his initial Heart Failure Clinic appointment on 06/01/23.

## 2023-06-02 ENCOUNTER — Telehealth: Payer: Self-pay | Admitting: Urology

## 2023-06-02 MED ORDER — SODIUM BICARBONATE 650 MG PO TABS: 1300.0000 mg | ORAL_TABLET | Freq: Two times a day (BID) | ORAL | 0 refills | Status: DC

## 2023-06-02 MED ORDER — TAMSULOSIN HCL 0.4 MG PO CAPS: 0.4000 mg | ORAL_CAPSULE | Freq: Every day | ORAL | 0 refills | Status: AC

## 2023-06-02 NOTE — Telephone Encounter (Signed)
Discussed with cardiology, Dr. Kirke Corin is in agreement to stop Plavix and start baby aspirin 81 mg.  Very complicated patient with risks with either continuing anticoagulation with Plavix or stopping anticoagulation with recent cardiac stent placement.  Patient message sent, message sent to nursing  He will contact us next week to confirm his hematuria improves off Plavix  Legrand Rams, MD 06/02/2023

## 2023-06-05 ENCOUNTER — Telehealth: Payer: Self-pay

## 2023-06-05 NOTE — Telephone Encounter (Signed)
Called pt, no answer. Unable to leave message as no voicemail is set up. 1st attempt. Will also send mychart message.

## 2023-06-05 NOTE — Telephone Encounter (Signed)
-----   Message from Sondra Come sent at 06/02/2023  2:55 PM EDT ----- Regarding: FW: joint patient Please let him know he can stop his Plavix today, and start 81 mg baby aspirin.  He should let us know how the blood in the urine is doing next week after stopping the Plavix, thanks  Legrand Rams, MD 06/02/2023 ----- Message ----- From: [place] Ouch, MD Sent: 06/02/2023   2:01 PM EDT To: Sondra Come, MD Subject: RE: joint patient                              Tough situation but I agree that we have to do something to stop the bleeding.  Given that it has been 1 month since PCI, I think it is reasonable to stop Plavix for now and start aspirin 81 mg once daily.  Thank you  M. Arida ----- Message ----- From: Sondra Come, MD Sent: 05/31/2023   8:11 AM EDT To: [place] Ouch, MD Subject: joint patient                                  Complicated patient w NSTEMI and drug eluting stent 6/10, ongoing hematuria in the setting of bilateral staghorn renal stones.  Challenging case, continues to have hematuria. Really no surgical options for [place] on anticoagulation...when would we be able to stop the plavix? 81mg  aspirin would be fine from my perspective- it has been 4 weeks today, could we stop the plavix and start 81mg  aspirin with plan try to resume plavix if urine clears? Thanks for your input, I know not any easy solutions  Thanks Legrand Rams, MD [facility]

## 2023-06-06 ENCOUNTER — Other Ambulatory Visit: Payer: Self-pay | Admitting: *Deleted

## 2023-06-06 NOTE — Telephone Encounter (Signed)
Declined all refills, we have been trying to get in touch with patient thru telephone and mychart with no answer.

## 2023-06-09 ENCOUNTER — Encounter: Payer: Self-pay | Admitting: Family

## 2023-06-09 ENCOUNTER — Ambulatory Visit: Payer: Medicare HMO | Attending: Family | Admitting: Family

## 2023-06-09 VITALS — BP 136/85 | HR 94 | Wt 207.0 lb

## 2023-06-09 DIAGNOSIS — N132 Hydronephrosis with renal and ureteral calculous obstruction: Secondary | ICD-10-CM | POA: Diagnosis not present

## 2023-06-09 DIAGNOSIS — I252 Old myocardial infarction: Secondary | ICD-10-CM | POA: Insufficient documentation

## 2023-06-09 DIAGNOSIS — I5022 Chronic systolic (congestive) heart failure: Secondary | ICD-10-CM | POA: Diagnosis not present

## 2023-06-09 DIAGNOSIS — N200 Calculus of kidney: Secondary | ICD-10-CM | POA: Diagnosis not present

## 2023-06-09 DIAGNOSIS — Z7982 Long term (current) use of aspirin: Secondary | ICD-10-CM | POA: Diagnosis not present

## 2023-06-09 DIAGNOSIS — I2510 Atherosclerotic heart disease of native coronary artery without angina pectoris: Secondary | ICD-10-CM | POA: Insufficient documentation

## 2023-06-09 DIAGNOSIS — Z955 Presence of coronary angioplasty implant and graft: Secondary | ICD-10-CM | POA: Insufficient documentation

## 2023-06-09 DIAGNOSIS — Z79899 Other long term (current) drug therapy: Secondary | ICD-10-CM | POA: Insufficient documentation

## 2023-06-09 MED ORDER — LOSARTAN POTASSIUM 25 MG PO TABS: 25.0000 mg | ORAL_TABLET | Freq: Every day | ORAL | 3 refills | Status: DC

## 2023-06-09 NOTE — Progress Notes (Signed)
Advanced Heart Failure Clinic Note    PCP: Pcp, No PCP-Cardiologist: Ronnie Nordmann, MD (saw during 06/24 admission)  HPI:  Ronnie Mann is a 67 y/o male with a history of NSTEMI, s/p PCI to proximal LAD and RCA on 05/01/23, HFrEF(EF 30 to 35%), kidney stones since the age of 75 and bilateral nephrolithiasis.   Admitted 04/28/23 due to new onset of substernal "vice like" substernal chest pain that radiated to his left arm. He reports ongoing shortness of breath associated with this. He also was awoken from sleep with this last night and had broken out into a cold sweat. Elevated lactic acid and elevated WBC at 14.4. 1 L of IVF given. Initial troponin was 54 rose to 234. 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 Developed flash pulmonary edema during left heart cath 06/10 and was treated with nitroglycerin drip. Diuretics held. Status post successful balloon angioplasty to the proximal LAD and drug-eluting stent placement to the proximal right coronary artery. Treated for pneumonia with antibiotics. Was in the ED 05/13/23 due to bilateral flank and suprapubic pain. CT renal was stable.   Admitted 05/16/23 due to shortness of breath, bilateral flank pain radiating towards lower abdomen, more on right than left, dysuria and gross hematuria. Patient deferred urology stent placement. Able to void after foley removed. Brilinta was switched to Plavix, aspirin discontinued in the setting of hematuria by cardiologist    Echo 04/30/23: EF 30-35% along with mild LVH and Grade II DD and moderate Ronnie.   LHC 05/01/23:   Prox LAD to Mid LAD lesion is 99% stenosed.   Mid LAD lesion is 60% stenosed.   1st Diag lesion is 70% stenosed.   Prox RCA lesion is 95% stenosed.   Mid RCA lesion is 30% stenosed.   RPDA lesion is 60% stenosed.   Dist LAD lesion is 99% stenosed.   A drug-eluting stent was successfully placed using a STENT ONYX FRONTIER 4.0X15.   Balloon angioplasty was  performed using a BALLN [place] EUPHORA RX 2.75X20.   Post intervention, there is a 20% residual stenosis.   Post intervention, there is a 0% residual stenosis.   There is severe left ventricular systolic dysfunction.   LV end diastolic pressure is severely elevated.   There is moderate (3+) mitral regurgitation.  1.  Severe two-vessel coronary artery disease with subtotal occlusion of the proximal LAD, diffuse moderate mid LAD disease and severe distal LAD disease.  In addition, there is 95% in the proximal right coronary artery with faint right to left collaterals to the LAD.  The left circumflex has mild nonobstructive disease and OM 3 is large and reaches the apex. 2.  Severely reduced LV systolic function with an EF of 25%.  Severely elevated left ventricular end-diastolic pressure at 34 mmHg. 3.  Successful balloon angioplasty to the proximal LAD and drug-eluting stent placement to the proximal right coronary artery.  He presents today for his initial HF visit with a chief complaint of moderate SOB with minimal exertion. Chronic in nature. Has associated fatigue, difficulty sleeping (due to kidney stone pain), occasional palpitations and occasional pedal edema along with this. Denies chest pain, cough, abdominal distention, dizziness or weight gain. Biggest complaint is of the kidney stone pain which is disrupting his sleep and would like a refill on his pain medication and xanax  No further hematuria since his plavix was stopped.   Review of Systems: [y] = yes, [ ]  = no  General: Weight gain [ ] ; Weight loss [ ] ; Anorexia [ ] ; Fatigue Cove.Etienne ]; Fever [ ] ; Chills [ ] ; Weakness [ ]   Cardiac: Chest pain/pressure [ ] ; Resting SOB [ ] ; Exertional SOB Cove.Etienne ]; Orthopnea [ ] ; Pedal Edema  [ y]; Palpitations [ ] ; Syncope [ ] ; Presyncope [ ] ; Paroxysmal nocturnal dyspnea[ ]   Pulmonary: Cough [ ] ; Wheezing[ ] ; Hemoptysis[ ] ; Sputum [ ] ; Snoring [ ]   GI: Vomiting[ ] ; Dysphagia[ ] ; Melena[ ] ; Hematochezia [ ] ;  Heartburn[ ] ; Abdominal pain [ ] ; Constipation [ ] ; Diarrhea [ ] ; BRBPR [ ]   GU: Hematuria[ ] ; Dysuria [ ] ; Nocturia[ ]   Vascular: Pain in legs with walking [ ] ; Pain in feet with lying flat [ ] ; Non-healing sores [ ] ; Stroke [ ] ; TIA [ ] ; Slurred speech [ ] ;  Neuro: Headaches[ ] ; Vertigo[ ] ; Seizures[ ] ; Paresthesias[ ] ;Blurred vision [ ] ; Diplopia [ ] ; Vision changes [ ]   Ortho/Skin: Arthritis [ ] ; Joint pain [ ] ; Muscle pain [ ] ; Joint swelling [ ] ; Back Pain [ ] ; Rash [ ]   Psych: Depression[ ] ; Anxiety[ ]   Heme: Bleeding problems [ ] ; Clotting disorders [ ] ; Anemia Cove.Etienne ]  Endocrine: Diabetes [ ] ; Thyroid dysfunction[ ]    Past Medical History:  Diagnosis Date   Coronary artery disease 05/01/2023   PCI/DES   HFrEF (heart failure with reduced ejection fraction) (HCC) 05/01/2023   Hiatal hernia    Ischemic cardiomyopathy 05/01/2023   LVEF 30-35%   Kidney stones     Current Outpatient Medications  Medication Sig Dispense Refill   ALPRAZolam (XANAX) 0.5 MG tablet Take 1 tablet (0.5 mg total) by mouth 2 (two) times daily as needed for anxiety. 20 tablet 0   ALPRAZolam (XANAX) 0.5 MG tablet Take 1 tablet (0.5 mg total) by mouth at bedtime as needed for anxiety. 7 tablet 0   atorvastatin (LIPITOR) 40 MG tablet Take 1 tablet (40 mg total) by mouth daily. 30 tablet 0   clopidogrel (PLAVIX) 75 MG tablet Take 1 tablet (75 mg total) by mouth daily. 30 tablet 0   Fe Fum-Vit C-Vit B12-FA (TRIGELS-F FORTE) CAPS capsule Take 1 capsule by mouth 2 (two) times daily. 30 capsule 0   metoprolol succinate (TOPROL-XL) 25 MG 24 hr tablet Take 0.5 tablets (12.5 mg total) by mouth daily. 30 tablet 0   polyethylene glycol (MIRALAX / GLYCOLAX) 17 g packet Take 17 g by mouth daily as needed for mild constipation. 14 each 0   sodium bicarbonate 650 MG tablet Take 2 tablets (1,300 mg total) by mouth 2 (two) times daily. 120 tablet 0   tamsulosin (FLOMAX) 0.4 MG CAPS capsule Take 1 capsule (0.4 mg total) by mouth  daily after breakfast. 30 capsule 0   No current facility-administered medications for this visit.    No Known Allergies    Social History   Socioeconomic History   Marital status: Divorced    Spouse name: Not on file   Number of children: Not on file   Years of education: Not on file   Highest education level: Not on file  Occupational History   Not on file  Tobacco Use   Smoking status: Never    Passive exposure: Never   Smokeless tobacco: Never  Substance and Sexual Activity   Alcohol use: Never   Drug use: Never   Sexual activity: Not on file  Other Topics Concern   Not on file  Social History Narrative   Not on file  Social Determinants of Health   Financial Resource Strain: Not on file  Food Insecurity: No Food Insecurity (05/16/2023)   Hunger Vital Sign    Worried About Running Out of Food in the Last Year: Never true    Ran Out of Food in the Last Year: Never true  Transportation Needs: No Transportation Needs (05/16/2023)   PRAPARE - Administrator, Civil Service (Medical): No    Lack of Transportation (Non-Medical): No  Physical Activity: Not on file  Stress: Not on file  Social Connections: Not on file  Intimate Partner Violence: Not At Risk (05/16/2023)   Humiliation, Afraid, Rape, and Kick questionnaire    Fear of Current or Ex-Partner: No    Emotionally Abused: No    Physically Abused: No    Sexually Abused: No     No family history on file.  Vitals:   06/09/23 1458  BP: 136/85  Pulse: 94  SpO2: 98%  Weight: 207 lb (93.9 kg)   Wt Readings from Last 3 Encounters:  06/09/23 207 lb (93.9 kg)  05/30/23 208 lb (94.3 kg)  05/27/23 203 lb 7.8 oz (92.3 kg)   Lab Results  Component Value Date   CREATININE 1.34 (H) 05/30/2023   CREATININE 2.35 (H) 05/27/2023   CREATININE 2.05 (H) 05/26/2023   PHYSICAL EXAM: General:  Well appearing. No respiratory difficulty HEENT: normal Neck: supple. no JVD. No lymphadenopathy or thyromegaly  appreciated. Cor: PMI nondisplaced. Regular rate & rhythm. No rubs, gallops or murmurs. Lungs: clear Abdomen: soft, nontender, nondistended. No hepatosplenomegaly. No bruits or masses.  Extremities: no cyanosis, clubbing, rash, 1+ pitting edema with R>L Neuro: alert & oriented x 3, cranial nerves grossly intact. moves all 4 extremities w/o difficulty. Affect pleasant.  ECG: not done   ASSESSMENT & PLAN:  1: Ischemic heart failure with reduced ejection fraction- - NSTEMI with PCI 06/24 - NYHA class II - euvolemic - weighing daily; instructed to call for an overnight weight gain of > 2 pounds or a weekly weight gain of > 5 pounds - Echo 04/30/23: EF 30-35% along with mild LVH and Grade II DD and moderate Ronnie.  - continue metoprolol 12.5mg  daily - begin losartan 25mg  daily; hopefully can titrate and then transition to entresto - due to chronic kidney stones etc, will not be a good candidate for SGLT2 - consider spironolactone if BP allows - BNP 05/16/23 was 245.5  2: CAD- - sees cardiology Ronnie Mann) 08/24 - continue atorvastatin 40mg  daily - continue ASA 81mg  daily - LHC 05/01/23:   Prox LAD to Mid LAD lesion is 99% stenosed.   Mid LAD lesion is 60% stenosed.   1st Diag lesion is 70% stenosed.   Prox RCA lesion is 95% stenosed.   Mid RCA lesion is 30% stenosed.   RPDA lesion is 60% stenosed.   Dist LAD lesion is 99% stenosed.   A drug-eluting stent was successfully placed using a STENT ONYX FRONTIER 4.0X15.   Balloon angioplasty was performed using a BALLN [place] EUPHORA RX 2.75X20.   Post intervention, there is a 20% residual stenosis.   Post intervention, there is a 0% residual stenosis.   There is severe left ventricular systolic dysfunction.   LV end diastolic pressure is severely elevated.   There is moderate (3+) mitral regurgitation.  1.  Severe two-vessel coronary artery disease with subtotal occlusion of the proximal LAD, diffuse moderate mid LAD disease and severe distal LAD  disease.  In addition, there is 95% in the  proximal right coronary artery with faint right to left collaterals to the LAD.  The left circumflex has mild nonobstructive disease and OM 3 is large and reaches the apex. 2.  Severely reduced LV systolic function with an EF of 25%.  Severely elevated left ventricular end-diastolic pressure at 34 mmHg. 3.  Successful balloon angioplasty to the proximal LAD and drug-eluting stent placement to the proximal right coronary artery.  3: Kidney disease- - saw urology Ronnie Mann) 07/24 - has bilateral staghorn renal stones and has hematuria; cardiology cleared to stop plavix and take ASA 81mg  daily  - BMP 05/30/23 showed sodium 133, potassium 4.3, creatinine 1.34 & GFR 58 - explained that I couldn't prescribe his pain medication or xanax   He says that he's going to call [facility] and get an appointment scheduled.   Return here 3-4 weeks with BMP to be checked at that time   Ronnie Freeze, FNP 06/09/23

## 2023-06-09 NOTE — Patient Instructions (Addendum)
Call the [facility] to get a primary care appointment scheduled   Start taking losartan as 1 tablet every day

## 2023-06-12 ENCOUNTER — Encounter: Payer: Self-pay | Admitting: Family

## 2023-06-12 ENCOUNTER — Other Ambulatory Visit: Payer: Self-pay

## 2023-06-12 DIAGNOSIS — N200 Calculus of kidney: Secondary | ICD-10-CM

## 2023-06-12 DIAGNOSIS — R310 Gross hematuria: Secondary | ICD-10-CM

## 2023-06-14 ENCOUNTER — Ambulatory Visit (INDEPENDENT_AMBULATORY_CARE_PROVIDER_SITE_OTHER): Payer: Medicare HMO | Admitting: Urology

## 2023-06-14 ENCOUNTER — Other Ambulatory Visit: Payer: Self-pay

## 2023-06-14 ENCOUNTER — Encounter: Payer: Self-pay | Admitting: Urology

## 2023-06-14 ENCOUNTER — Other Ambulatory Visit
Admission: RE | Admit: 2023-06-14 | Discharge: 2023-06-14 | Disposition: A | Discharge status: Home / Self Care | Payer: Medicare HMO | Attending: Urology | Admitting: Urology

## 2023-06-14 VITALS — BP 122/78 | HR 80 | Ht 69.0 in | Wt 206.0 lb

## 2023-06-14 DIAGNOSIS — N200 Calculus of kidney: Secondary | ICD-10-CM | POA: Diagnosis present

## 2023-06-14 DIAGNOSIS — R310 Gross hematuria: Secondary | ICD-10-CM

## 2023-06-14 LAB — BASIC METABOLIC PANEL
Anion gap: 8 (ref 5–15)
BUN: 21 mg/dL (ref 8–23)
CO2: 21 mmol/L — ABNORMAL LOW (ref 22–32)
Calcium: 8.5 mg/dL — ABNORMAL LOW (ref 8.9–10.3)
Chloride: 104 mmol/L (ref 98–111)
Creatinine, Ser: 1.12 mg/dL (ref 0.61–1.24)
GFR, Estimated: >60 mL/min (ref >60)
Glucose, Bld: 158 mg/dL — ABNORMAL HIGH (ref 70–99)
Potassium: 3.7 mmol/L (ref 3.5–5.1)
Sodium: 133 mmol/L — ABNORMAL LOW (ref 135–145)

## 2023-06-14 LAB — HEMOGLOBIN AND HEMATOCRIT, BLOOD
HCT: 25.5 % — ABNORMAL LOW (ref 39.0–52.0)
Hemoglobin: 8.4 g/dL — ABNORMAL LOW (ref 13.0–17.0)

## 2023-06-14 MED ORDER — HYDROCODONE-ACETAMINOPHEN 5-325 MG PO TABS
1.0000 | ORAL_TABLET | Freq: Four times a day (QID) | ORAL | 0 refills | Status: DC | PRN
  Filled 2023-06-14: qty 30, 8d supply, fill #0

## 2023-06-14 MED ORDER — ALPRAZOLAM 0.5 MG PO TABS
0.5000 mg | ORAL_TABLET | Freq: Every evening | ORAL | 0 refills | Status: DC | PRN
  Filled 2023-06-14: qty 10, 10d supply, fill #0

## 2023-06-14 NOTE — Progress Notes (Signed)
   06/14/2023 2:28 PM   Ronnie Mann Jul 02, 1956 [id number]  Reason for visit: Follow up bilateral nephrolithiasis, gross hematuria  HPI: Very comorbid 67 year old male with prolonged recent hospital stay for bilateral nephrolithiasis and gross hematuria after recent NSTEMI on Plavix.  Cardiac catheterization was performed 05/01/2023, and cardiology stated at least 4 weeks of DAPT.   He was previously followed by Dr. Evelene Croon for urology in [place] prior to his retirement.  He has a likely chronic 15 mm left proximal ureteral stone with left renal atrophy, 2.5 cm left lower pole stone, minimal left-sided pain.  He has a 4.5 cm right upper pole staghorn stone, originally had no right-sided hydronephrosis, but after starting anticoagulation developed gross hematuria, and most recent CT scan 05/21/2023 shows mild to moderate right-sided hydronephrosis with hyperdense material within the proximal ureter likely representing hematuria/clots causing obstruction, no ureteral stones.  He also had worsening renal function at that time with a creatinine up to 2.5, we offered a right ureteral stent at that time to improve renal function and potentially improve right-sided flank pain, but he deferred as he has not tolerated ureteral stents well in the past.  He was not a candidate for percutaneous nephrostomy tube in the setting of Plavix for recent NSTEMI.  During his prolonged hospitalization he required multiple blood transfusions, we were able to stop Plavix after discussing with cardiology on 06/02/2023, and hematuria resolved.  Hematocrit stable today at 25.  He had been doing relatively well over the last week when urine had cleared, but he reports recurrence of gross hematuria yesterday that is improved today.  He has chronic right-sided back pain and lower pelvic pain.  I had another very frank conversation with the patient and his son about our limited options in the setting of recent NSTEMI with cardiac  stent placement and need for anticoagulation, his intolerance of ureteral stents, importance of repeat blood work, and stone treatment options.  With his significant stone burden bilaterally, I think he would be best suited with PCNL, but would need to be off anticoagulation.  At this time he does not have any right-sided ureteral stones that would explain his flank pain, but likely is having intermittent renal colic secondary to hematuria and clots from the right collecting system secondary to the large staghorn stone causing intermittent obstruction.  If his hematuria continues to improve, anticipate his flank pain would resolve.  Extremely challenging scenario without many feasible options for immediate resolution.  I think if we can get him through the next few weeks and hematuria resolves can likely monitor staghorn stones at least for the short-term until felt to be cleared from a cardiac perspective for PCNL.  Surveillance would also be an option with his comorbidities and nonobstructing stones, though likely will ultimately become an issue at some point with surveillance alone.  -Pain medication refilled -Follow-up BMP today and contact with results, if worsening renal function will order CT stone protocol -Return precautions were discussed extensively -Reminder sent to scheduler  NF:[id number] to [facility] for consideration of potentially bilateral PCNL in the setting of bilateral staghorn stones, left renal atrophy, especially in the setting of recent cardiac event and anticoagulation   Sondra Come, MD  [facility] [address], [address] [place], [place] [address] [phone]

## 2023-06-15 ENCOUNTER — Ambulatory Visit: Payer: Self-pay | Admitting: Urology

## 2023-06-20 ENCOUNTER — Ambulatory Visit: Payer: Medicare HMO | Admitting: Urology

## 2023-06-29 NOTE — Progress Notes (Signed)
PCP: none Primary Cardiologist: Julien Nordmann, MD (saw during 06/24 admission)  HPI:  Mr Schlegel is a 67 y/o male with a history of NSTEMI, s/p PCI to proximal LAD and RCA on 05/01/23, HFrEF(EF 30 to 35%), previous tobacco/ alcohol use, kidney stones since the age of 17 and bilateral nephrolithiasis.   Admitted 04/28/23 due to new onset of substernal "vice like" substernal chest pain that radiated to his left arm. He reports ongoing shortness of breath associated with this. He also was awoken from sleep with this last night and had broken out into a cold sweat. Elevated lactic acid and elevated WBC at 14.4. 1 L of IVF given. Initial troponin was 54 rose to 234. 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 Developed flash pulmonary edema during left heart cath 06/10 and was treated with nitroglycerin drip. Diuretics held. Status post successful balloon angioplasty to the proximal LAD and drug-eluting stent placement to the proximal right coronary artery. Treated for pneumonia with antibiotics. Was in the ED 05/13/23 due to bilateral flank and suprapubic pain. CT renal was stable. Admitted 05/16/23 due to shortness of breath, bilateral flank pain radiating towards lower abdomen, more on right than left, dysuria and gross hematuria. Patient deferred urology stent placement. Able to void after foley removed. Brilinta was switched to Plavix, aspirin discontinued in the setting of hematuria by cardiologist    Echo 04/30/23: EF 30-35% along with mild LVH and Grade II DD and moderate MR.   LHC 05/01/23:   Prox LAD to Mid LAD lesion is 99% stenosed.   Mid LAD lesion is 60% stenosed.   1st Diag lesion is 70% stenosed.   Prox RCA lesion is 95% stenosed.   Mid RCA lesion is 30% stenosed.   RPDA lesion is 60% stenosed.   Dist LAD lesion is 99% stenosed.   A drug-eluting stent was successfully placed using a STENT ONYX FRONTIER 4.0X15.   Balloon angioplasty was performed using a  BALLN [place] EUPHORA RX 2.75X20.   Post intervention, there is a 20% residual stenosis.   Post intervention, there is a 0% residual stenosis.   There is severe left ventricular systolic dysfunction.   LV end diastolic pressure is severely elevated.   There is moderate (3+) mitral regurgitation.  1.  Severe two-vessel coronary artery disease with subtotal occlusion of the proximal LAD, diffuse moderate mid LAD disease and severe distal LAD disease.  In addition, there is 95% in the proximal right coronary artery with faint right to left collaterals to the LAD.  The left circumflex has mild nonobstructive disease and OM 3 is large and reaches the apex. 2.  Severely reduced LV systolic function with an EF of 25%.  Severely elevated left ventricular end-diastolic pressure at 34 mmHg. 3.  Successful balloon angioplasty to the proximal LAD and drug-eluting stent placement to the proximal right coronary artery.  He presents today for a HF f/u visit with a chief complaint of minimal SOB with moderate exertion. Chronic in nature. Has associated fatigue, pedal edema and chronic difficulty sleeping. Denies cough, chest pain, palpitations, abdominal distention or weight gain. No further chest pain since stopping the losartan.   Losartan was started at last visit but patient said it made him feel weird and caused chest pain/ skipping beats. It was subsequently stopped with resolution of symptoms.   ROS: All systems negative except as listed in HPI, PMH and Problem List.  SH:  Social History   Socioeconomic History  Marital status: Divorced    Spouse name: Not on file   Number of children: Not on file   Years of education: Not on file   Highest education level: Not on file  Occupational History   Not on file  Tobacco Use   Smoking status: Never    Passive exposure: Never   Smokeless tobacco: Never  Substance and Sexual Activity   Alcohol use: Never   Drug use: Never   Sexual activity: Not on file   Other Topics Concern   Not on file  Social History Narrative   Not on file   Social Determinants of Health   Financial Resource Strain: Not on file  Food Insecurity: No Food Insecurity (05/16/2023)   Hunger Vital Sign    Worried About Running Out of Food in the Last Year: Never true    Ran Out of Food in the Last Year: Never true  Transportation Needs: No Transportation Needs (05/16/2023)   PRAPARE - Administrator, Civil Service (Medical): No    Lack of Transportation (Non-Medical): No  Physical Activity: Not on file  Stress: Not on file  Social Connections: Not on file  Intimate Partner Violence: Not At Risk (05/16/2023)   Humiliation, Afraid, Rape, and Kick questionnaire    Fear of Current or Ex-Partner: No    Emotionally Abused: No    Physically Abused: No    Sexually Abused: No    FH: No family history on file.  Past Medical History:  Diagnosis Date   Coronary artery disease 05/01/2023   PCI/DES   HFrEF (heart failure with reduced ejection fraction) (HCC) 05/01/2023   Hiatal hernia    Ischemic cardiomyopathy 05/01/2023   LVEF 30-35%   Kidney stones     Current Outpatient Medications  Medication Sig Dispense Refill   ALPRAZolam (XANAX) 0.5 MG tablet Take 1 tablet (0.5 mg total) by mouth at bedtime as needed for anxiety. 10 tablet 0   aspirin EC 81 MG tablet Take 81 mg by mouth daily. Swallow whole.     atorvastatin (LIPITOR) 40 MG tablet Take 1 tablet (40 mg total) by mouth daily. 30 tablet 0   Fe Fum-Vit C-Vit B12-FA (TRIGELS-F FORTE) CAPS capsule Take 1 capsule by mouth 2 (two) times daily. 30 capsule 0   HYDROcodone-acetaminophen (NORCO/VICODIN) 5-325 MG tablet Take 1 tablet by mouth every 6 (six) hours as needed for severe pain. 30 tablet 0   losartan (COZAAR) 25 MG tablet Take 1 tablet (25 mg total) by mouth daily. 90 tablet 3   metoprolol succinate (TOPROL-XL) 25 MG 24 hr tablet Take 0.5 tablets (12.5 mg total) by mouth daily. 30 tablet 0    polyethylene glycol (MIRALAX / GLYCOLAX) 17 g packet Take 17 g by mouth daily as needed for mild constipation. 14 each 0   sodium bicarbonate 650 MG tablet Take 2 tablets (1,300 mg total) by mouth 2 (two) times daily. 120 tablet 0   tamsulosin (FLOMAX) 0.4 MG CAPS capsule Take 1 capsule (0.4 mg total) by mouth daily after breakfast. 30 capsule 0   No current facility-administered medications for this visit.   Vitals:   06/30/23 0903  BP: (!) 146/99  Pulse: 84  SpO2: 100%  Weight: 201 lb (91.2 kg)   Wt Readings from Last 3 Encounters:  06/30/23 201 lb (91.2 kg)  06/14/23 206 lb (93.4 kg)  06/09/23 207 lb (93.9 kg)   Lab Results  Component Value Date   CREATININE 1.12 06/14/2023  CREATININE 1.34 (H) 05/30/2023   CREATININE 2.35 (H) 05/27/2023   PHYSICAL EXAM:  General:  Well appearing. No resp difficulty HEENT: normal Neck: supple. JVP flat. No lymphadenopathy or thryomegaly appreciated. Cor: PMI normal. Regular rate & rhythm. No rubs, gallops or murmurs. Lungs: clear Abdomen: soft, nontender, nondistended. No hepatosplenomegaly. No bruits or masses.  Extremities: no cyanosis, clubbing, rash, edema Neuro: alert & oriented x3, cranial nerves grossly intact. Moves all 4 extremities w/o difficulty. Affect pleasant.   ECG: not done   ASSESSMENT & PLAN:  1: Ischemic heart failure with reduced ejection fraction- - NSTEMI with PCI 06/24 - NYHA class II - euvolemic - weighing daily; reminded to call for an overnight weight gain of > 2 pounds or a weekly weight gain of > 5 pounds - weight down 6 pounds from last visit here 3 weeks ago - Echo 04/30/23: EF 30-35% along with mild LVH and Grade II DD and moderate MR.  - increase metoprolol to 25mg  daily - begin spironolactone 12.5mg  daily - BMP in 1 week - was not able to tolerate losartan as it caused chest pain (have added this today to his allergies) - may consider entresto at next visit and hopefully he wouldn't have chest  pain with this - due to chronic kidney stones etc, may not be a good candidate for SGLT2 - BNP 05/16/23 was 245.5 - will refer to [facility] pharmacy to assist with med titration  2: CAD- - sees cardiology Mariah Milling) 08/24 - continue atorvastatin 40mg  daily; refilled today - continue ASA 81mg  daily - LHC 05/01/23:   Prox LAD to Mid LAD lesion is 99% stenosed.   Mid LAD lesion is 60% stenosed.   1st Diag lesion is 70% stenosed.   Prox RCA lesion is 95% stenosed.   Mid RCA lesion is 30% stenosed.   RPDA lesion is 60% stenosed.   Dist LAD lesion is 99% stenosed.   A drug-eluting stent was successfully placed using a STENT ONYX FRONTIER 4.0X15.   Balloon angioplasty was performed using a BALLN [place] EUPHORA RX 2.75X20.   Post intervention, there is a 20% residual stenosis.   Post intervention, there is a 0% residual stenosis.   There is severe left ventricular systolic dysfunction.   LV end diastolic pressure is severely elevated.   There is moderate (3+) mitral regurgitation.  1.  Severe two-vessel coronary artery disease with subtotal occlusion of the proximal LAD, diffuse moderate mid LAD disease and severe distal LAD disease.  In addition, there is 95% in the proximal right coronary artery with faint right to left collaterals to the LAD.  The left circumflex has mild nonobstructive disease and OM 3 is large and reaches the apex. 2.  Severely reduced LV systolic function with an EF of 25%.  Severely elevated left ventricular end-diastolic pressure at 34 mmHg. 3.  Successful balloon angioplasty to the proximal LAD and drug-eluting stent placement to the proximal right coronary artery.  3: Kidney disease- - saw urology Richardo Hanks) 07/24 - has bilateral staghorn renal stones and has hematuria - possible PCNL in the future   4: HTN- - BP 146/99 - adding spiro 12.5mg  daily per above - to see PCP at [facility] 07/21/23 - BMP 06/14/23 showed sodium 133, potassium 3.7, creatinine 1.12 & GFR  >60  Return in 2-3 weeks to see the pharmacist. Sooner if needed.

## 2023-06-30 ENCOUNTER — Other Ambulatory Visit: Payer: Self-pay

## 2023-06-30 ENCOUNTER — Encounter: Payer: Self-pay | Admitting: Family

## 2023-06-30 ENCOUNTER — Ambulatory Visit: Payer: Medicare HMO | Admitting: Family

## 2023-06-30 VITALS — BP 146/99 | HR 84 | Wt 201.0 lb

## 2023-06-30 DIAGNOSIS — Z955 Presence of coronary angioplasty implant and graft: Secondary | ICD-10-CM | POA: Insufficient documentation

## 2023-06-30 DIAGNOSIS — I2510 Atherosclerotic heart disease of native coronary artery without angina pectoris: Secondary | ICD-10-CM | POA: Insufficient documentation

## 2023-06-30 DIAGNOSIS — I5022 Chronic systolic (congestive) heart failure: Secondary | ICD-10-CM | POA: Diagnosis not present

## 2023-06-30 DIAGNOSIS — R0602 Shortness of breath: Secondary | ICD-10-CM | POA: Diagnosis present

## 2023-06-30 DIAGNOSIS — I110 Hypertensive heart disease with heart failure: Secondary | ICD-10-CM | POA: Insufficient documentation

## 2023-06-30 DIAGNOSIS — I10 Essential (primary) hypertension: Secondary | ICD-10-CM

## 2023-06-30 DIAGNOSIS — Z87442 Personal history of urinary calculi: Secondary | ICD-10-CM | POA: Diagnosis not present

## 2023-06-30 DIAGNOSIS — I252 Old myocardial infarction: Secondary | ICD-10-CM | POA: Diagnosis present

## 2023-06-30 DIAGNOSIS — N200 Calculus of kidney: Secondary | ICD-10-CM | POA: Insufficient documentation

## 2023-06-30 DIAGNOSIS — R319 Hematuria, unspecified: Secondary | ICD-10-CM | POA: Insufficient documentation

## 2023-06-30 MED ORDER — SPIRONOLACTONE 25 MG PO TABS
12.5000 mg | ORAL_TABLET | Freq: Every day | ORAL | 3 refills | Status: DC
  Filled 2023-06-30 (×2): qty 45, 90d supply, fill #0

## 2023-06-30 MED ORDER — METOPROLOL SUCCINATE ER 25 MG PO TB24
25.0000 mg | ORAL_TABLET | Freq: Every day | ORAL | 3 refills | Status: DC
  Filled 2023-06-30 (×2): qty 90, 90d supply, fill #0

## 2023-06-30 MED ORDER — ATORVASTATIN CALCIUM 40 MG PO TABS
40.0000 mg | ORAL_TABLET | Freq: Every day | ORAL | 3 refills | Status: DC
  Filled 2023-06-30 (×2): qty 90, 90d supply, fill #0

## 2023-06-30 NOTE — Patient Instructions (Addendum)
Medication Changes:  Start taking Metoprolol 25 mg (1 tablet) daily  Start taking Spironolactone 12.5 mg (0.5 tablet) daily  Lab Work:  You will have lab work to complete in 1 week.  Please come to the medical mall entrance.    Special Instructions // Education:  Do the following things EVERYDAY: Weigh yourself in the morning before breakfast. Write it down and keep it in a log. Take your medicines as prescribed Eat low salt foods--Limit salt (sodium) to 2000 mg per day.  Stay as active as you can everyday Limit all fluids for the day to less than 2 liters   Follow-Up in: please follow up with our pharmacist [facility] in 2 weeks.    If you have any questions or concerns before your next appointment please send [place] a message through [place] or call our office at [phone] Monday-Friday 8 am-5 pm.   If you have an urgent need after hours on the weekend please call your Primary Cardiologist or the [facility] in [place] at [phone].

## 2023-07-07 ENCOUNTER — Other Ambulatory Visit
Admission: RE | Admit: 2023-07-07 | Discharge: 2023-07-07 | Disposition: A | Discharge status: Home / Self Care | Payer: Medicare HMO | Source: Ambulatory Visit | Attending: Family | Admitting: Family

## 2023-07-07 DIAGNOSIS — I5022 Chronic systolic (congestive) heart failure: Secondary | ICD-10-CM | POA: Insufficient documentation

## 2023-07-07 LAB — BASIC METABOLIC PANEL
Anion gap: 8 (ref 5–15)
BUN: 20 mg/dL (ref 8–23)
CO2: 24 mmol/L (ref 22–32)
Calcium: 9.2 mg/dL (ref 8.9–10.3)
Chloride: 103 mmol/L (ref 98–111)
Creatinine, Ser: 1.05 mg/dL (ref 0.61–1.24)
GFR, Estimated: >60 mL/min (ref >60)
Glucose, Bld: 161 mg/dL — ABNORMAL HIGH (ref 70–99)
Potassium: 4.3 mmol/L (ref 3.5–5.1)
Sodium: 135 mmol/L (ref 135–145)

## 2023-07-10 NOTE — Progress Notes (Deleted)
Cardiology Office Note  Date:  07/10/2023   ID:  Ronnie Mann, DOB 1956/09/25, MRN [id number]  PCP:  Pcp, No   No chief complaint on file.   HPI:  Ronnie Mann is a 67 y/o male with a history of  Coronary artery disease NSTEMI, s/p PCI to proximal LAD and RCA on 05/01/23,  HFrEF(EF 30 to 35%),  Tree of tobacco/ alcohol use,  kidney stones since the age of 16 and bilateral nephrolithiasis Who presents to establish care in the [place] office for his coronary artery disease     Admitted 04/28/23  substernal chest pain that radiated to his left arm. shortness of breath .   Elevated lactic acid and elevated WBC at 14.4. 1 L of IVF given.  troponin was 54 rose to 234.  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 Developed flash pulmonary edema during left heart cath 06/10 and was treated with nitroglycerin drip. Diuretics held. Status post successful balloon angioplasty to the proximal LAD and drug-eluting stent placement to the proximal right coronary artery. Treated for pneumonia with antibiotics.   ED 05/13/23 due to bilateral flank and suprapubic pain. CT renal was stable.  Admitted 05/16/23 due to shortness of breath, bilateral flank pain radiating towards lower abdomen, more on right than left, dysuria and gross hematuria. Patient deferred urology stent placement. Able to void after foley removed. Brilinta was switched to Plavix, aspirin discontinued in the setting of hematuria by cardiologist     Echo 04/30/23: EF 30-35% along with mild LVH and Grade II DD and moderate Ronnie.    LHC 05/01/23:   Prox LAD to Mid LAD lesion is 99% stenosed.   Mid LAD lesion is 60% stenosed.   1st Diag lesion is 70% stenosed.   Prox RCA lesion is 95% stenosed.   Mid RCA lesion is 30% stenosed.   RPDA lesion is 60% stenosed.   Dist LAD lesion is 99% stenosed.   A drug-eluting stent was successfully placed using a STENT ONYX FRONTIER 4.0X15.   Balloon angioplasty was  performed using a BALLN [place] EUPHORA RX 2.75X20.   Post intervention, there is a 20% residual stenosis.   Post intervention, there is a 0% residual stenosis.   There is severe left ventricular systolic dysfunction.   LV end diastolic pressure is severely elevated.   There is moderate (3+) mitral regurgitation.  1.  Severe two-vessel coronary artery disease with subtotal occlusion of the proximal LAD, diffuse moderate mid LAD disease and severe distal LAD disease.  In addition, there is 95% in the proximal right coronary artery with faint right to left collaterals to the LAD.  The left circumflex has mild nonobstructive disease and OM 3 is large and reaches the apex. 2.  Severely reduced LV systolic function with an EF of 25%.  Severely elevated left ventricular end-diastolic pressure at 34 mmHg. 3.  Successful balloon angioplasty to the proximal LAD and drug-eluting stent placement to the proximal right coronary artery.     PMH:   has a past medical history of Coronary artery disease (05/01/2023), HFrEF (heart failure with reduced ejection fraction) (HCC) (05/01/2023), Hiatal hernia, Ischemic cardiomyopathy (05/01/2023), and Kidney stones.  PSH:    Past Surgical History:  Procedure Laterality Date   CORONARY STENT INTERVENTION N/A 05/01/2023   Procedure: CORONARY STENT INTERVENTION;  Surgeon: Iran Ouch, MD;  Location: [facility] INVASIVE CV LAB;  Service: Cardiovascular;  Laterality: N/A;   LEFT HEART CATH AND CORONARY ANGIOGRAPHY  N/A 05/01/2023   Procedure: LEFT HEART CATH AND CORONARY ANGIOGRAPHY;  Surgeon: Iran Ouch, MD;  Location: [facility] INVASIVE CV LAB;  Service: Cardiovascular;  Laterality: N/A;    Current Outpatient Medications  Medication Sig Dispense Refill   ALPRAZolam (XANAX) 0.5 MG tablet Take 1 tablet (0.5 mg total) by mouth at bedtime as needed for anxiety. 10 tablet 0   aspirin EC 81 MG tablet Take 81 mg by mouth daily. Swallow whole.     atorvastatin (LIPITOR) 40 MG  tablet Take 1 tablet (40 mg total) by mouth daily. 90 tablet 3   Fe Fum-Vit C-Vit B12-FA (TRIGELS-F FORTE) CAPS capsule Take 1 capsule by mouth 2 (two) times daily. (Patient not taking: Reported on 06/30/2023) 30 capsule 0   HYDROcodone-acetaminophen (NORCO/VICODIN) 5-325 MG tablet Take 1 tablet by mouth every 6 (six) hours as needed for severe pain. 30 tablet 0   metoprolol succinate (TOPROL-XL) 25 MG 24 hr tablet Take 1 tablet (25 mg total) by mouth daily. 90 tablet 3   polyethylene glycol (MIRALAX / GLYCOLAX) 17 g packet Take 17 g by mouth daily as needed for mild constipation. 14 each 0   sodium bicarbonate 650 MG tablet Take 2 tablets (1,300 mg total) by mouth 2 (two) times daily. 120 tablet 0   spironolactone (ALDACTONE) 25 MG tablet Take 0.5 tablets (12.5 mg total) by mouth daily. 45 tablet 3   ticagrelor (BRILINTA) 90 MG TABS tablet Take 90 mg by mouth 2 (two) times daily.     No current facility-administered medications for this visit.     Allergies:   Losartan   Social History:  The patient  reports that he has never smoked. He has never been exposed to tobacco smoke. He has never used smokeless tobacco. He reports that he does not drink alcohol and does not use drugs.   Family History:   family history is not on file.    Review of Systems: ROS   PHYSICAL EXAM: VS:  There were no vitals taken for this visit. , BMI There is no height or weight on file to calculate BMI. GEN: Well nourished, well developed, in no acute distress HEENT: normal Neck: no JVD, carotid bruits, or masses Cardiac: RRR; no murmurs, rubs, or gallops,no edema  Respiratory:  clear to auscultation bilaterally, normal work of breathing GI: soft, nontender, nondistended, + BS MS: no deformity or atrophy Skin: warm and dry, no rash Neuro:  Strength and sensation are intact Psych: euthymic mood, full affect    Recent Labs: 04/28/2023: TSH 4.483 05/05/2023: Magnesium 2.3 05/16/2023: ALT 22; B Natriuretic  Peptide 245.5 05/27/2023: Platelets 145 06/14/2023: Hemoglobin 8.4 07/07/2023: BUN 20; Creatinine, Ser 1.05; Potassium 4.3; Sodium 135    Lipid Panel Lab Results  Component Value Date   CHOL 141 05/06/2023   HDL 24 (L) 05/06/2023   LDLCALC 72 05/06/2023   TRIG 227 (H) 05/06/2023      Wt Readings from Last 3 Encounters:  06/30/23 201 lb (91.2 kg)  06/14/23 206 lb (93.4 kg)  06/09/23 207 lb (93.9 kg)       ASSESSMENT AND PLAN:  Problem List Items Addressed This Visit   None    Disposition:   F/U  12 months   Total encounter time more than 30 minutes  Greater than 50% was spent in counseling and coordination of care with the patient    Signed, Dossie Arbour, M.D., Ph.D. [facility] [place], [place] [phone]

## 2023-07-11 ENCOUNTER — Ambulatory Visit: Payer: Medicare HMO | Admitting: Cardiovascular Disease

## 2023-07-11 DIAGNOSIS — I2510 Atherosclerotic heart disease of native coronary artery without angina pectoris: Secondary | ICD-10-CM

## 2023-07-11 DIAGNOSIS — I5022 Chronic systolic (congestive) heart failure: Secondary | ICD-10-CM

## 2023-07-11 DIAGNOSIS — N200 Calculus of kidney: Secondary | ICD-10-CM

## 2023-07-11 DIAGNOSIS — R310 Gross hematuria: Secondary | ICD-10-CM

## 2023-07-11 DIAGNOSIS — I10 Essential (primary) hypertension: Secondary | ICD-10-CM

## 2023-07-11 DIAGNOSIS — I255 Ischemic cardiomyopathy: Secondary | ICD-10-CM

## 2023-07-14 ENCOUNTER — Other Ambulatory Visit (HOSPITAL_COMMUNITY): Payer: Self-pay

## 2023-07-14 NOTE — Progress Notes (Unsigned)
Advanced Heart Failure Clinic Note  PCP: Pcp, No  PCP-Cardiologist: Julien Nordmann, MD  HF-Cardiologist: Dr. Dorthula Nettles  HPI:  Ronnie Mann is a 67 y/o male with a history of NSTEMI, s/p PCI to proximal LAD and RCA on 05/01/23, HFrEF(EF 30 to 35%), previous tobacco/ alcohol use, kidney stones since the age of 66 and bilateral nephrolithiasis.   Admitted 04/28/23 due to new onset of substernal "vice like" substernal chest pain that radiated to his left arm and shortness of breath. Developed flash pulmonary edema during left heart cath 06/10 and was treated with nitroglycerin drip. Status post successful balloon angioplasty to the proximal LAD and drug-eluting stent placement to the proximal right coronary artery.   Echo 04/30/23 showed LVEF 30-35% along with mild LVH and Grade II DD and moderate Ronnie. Seen by Advanced HF team during admission on 05/03/23.  Visited ED 05/13/23 due to bilateral flank and suprapubic pain. CT renal was stable.   Admitted 05/16/23 due to shortness of breath, bilateral flank pain radiating towards lower abdomen, more on right than left, dysuria and gross hematuria. Patient deferred urology stent placement. Able to void after foley removed. Brilinta was switched to Plavix, aspirin discontinued in the setting of hematuria by cardiologist     Seen in CHF clinic on 06/09/23 where losartan 25 mg daily was added, however losartan was stopped shortly after due to complaints of chest pain/palpitations.   CHF clinic on 06/30/23 where spironolactone 12.5 mg daily was added and metoprolol was increased to 25 mg daily.   Today [name] returns to Heart Failure Clinic for pharmacist medication titration. Reports feeling ***. Reports {CHL AMB [facility] Symptoms:30234} Denies {CHL AMB [facility] Symptoms:30234}. {ACTIONS;DENIES/REPORTS:21021675::"Denies"} being able to complete all activities of daily living (ADLs). Is *** active throughout the day. Weight at home is *** pounds. Takes {CHL  AMB [facility] Medications:210917260} ***. Appetite ***. {Does Follow/Does Not Follow:210917261} a low sodium diet.  Current HF Medications: Beta Blocker: Metoprolol succinate 25 mg daily Mineralocorticoid Receptor Antagonist (MRA): Spironolactone 12.5 mg daily   Has the patient been experiencing any side effects to the medications prescribed? {yes/no:20286}  Does the patient have any problems obtaining medications due to transportation or finances? {yes/no:20286}  Understanding of regimen: {CHL AMB [facility] Excellent/Good/Fair/Poor:210917262}  Understanding of indications: {CHL AMB [facility] Excellent/Good/Fair/Poor:210917262}  Potential of adherence: {CHL AMB [facility] Excellent/Good/Fair/Poor:210917262}  Patient understands to avoid NSAIDs.  Patient understands to avoid decongestants.  Pertinent Lab Values: Creatinine, Ser  Date Value Ref Range Status  07/07/2023 1.05 0.61 - 1.24 mg/dL Final   BUN  Date Value Ref Range Status  07/07/2023 20 8 - 23 mg/dL Final   Potassium  Date Value Ref Range Status  07/07/2023 4.3 3.5 - 5.1 mmol/L Final   Sodium  Date Value Ref Range Status  07/07/2023 135 135 - 145 mmol/L Final   B Natriuretic Peptide  Date Value Ref Range Status  05/16/2023 245.5 (H) 0.0 - 100.0 pg/mL Final    Comment:    Performed at [facility], [address] [place], [place] [address]   Magnesium  Date Value Ref Range Status  05/05/2023 2.3 1.7 - 2.4 mg/dL Final    Comment:    Performed at [facility], [address] [place], [place] [address]   TSH  Date Value Ref Range Status  04/28/2023 4.483 0.350 - 4.500 uIU/mL Final    Comment:    Performed by a 3rd Generation assay with a functional sensitivity of <=0.01 uIU/mL. Performed  at [facility], [address], [place], [place] [address]     Vital Signs: There were no vitals filed for this visit.  Assessment/Plan:  1: Heart failure with reduced ejection fraction due to ischemic  cardiomyopathy. - NSTEMI with PCI 06/24 - NYHA class II - Euvolemic - Weighing daily; reminded to call for an overnight weight gain of > 2 pounds or a weekly weight gain of > 5 pounds - weight down 6 pounds from last visit here 3 weeks ago - Echo 04/30/23: EF 30-35% along with mild LVH and Grade II DD and moderate Ronnie.  - increase metoprolol to 25mg  daily - begin spironolactone 12.5mg  daily - BMP in 1 week - was not able to tolerate losartan as it caused chest pain (have added this today to his allergies) - may consider entresto at next visit and hopefully he wouldn't have chest pain with this - due to chronic kidney stones etc, may not be a good candidate for SGLT2 - BNP 05/16/23 was 245.5 - will refer to [facility] pharmacy to assist with med titration   2: CAD- - sees cardiology Mariah Milling) 08/24 - continue atorvastatin 40mg  daily; refilled today - continue ASA 81mg  daily - LHC 05/01/23:   Prox LAD to Mid LAD lesion is 99% stenosed.   Mid LAD lesion is 60% stenosed.   1st Diag lesion is 70% stenosed.   Prox RCA lesion is 95% stenosed.   Mid RCA lesion is 30% stenosed.   RPDA lesion is 60% stenosed.   Dist LAD lesion is 99% stenosed.   A drug-eluting stent was successfully placed using a STENT ONYX FRONTIER 4.0X15.   Balloon angioplasty was performed using a BALLN [place] EUPHORA RX 2.75X20.   Post intervention, there is a 20% residual stenosis.   Post intervention, there is a 0% residual stenosis.   There is severe left ventricular systolic dysfunction.   LV end diastolic pressure is severely elevated.   There is moderate (3+) mitral regurgitation.  1.  Severe two-vessel coronary artery disease with subtotal occlusion of the proximal LAD, diffuse moderate mid LAD disease and severe distal LAD disease.  In addition, there is 95% in the proximal right coronary artery with faint right to left collaterals to the LAD.  The left circumflex has mild nonobstructive disease and OM 3 is large and reaches the  apex. 2.  Severely reduced LV systolic function with an EF of 25%.  Severely elevated left ventricular end-diastolic pressure at 34 mmHg. 3.  Successful balloon angioplasty to the proximal LAD and drug-eluting stent placement to the proximal right coronary artery.   3: Kidney disease- - seen by urology Richardo Hanks) 05/2023. No urinary stenting planned a this time. - Has bilateral nonobstructive staghorn renal stones. No history of renal artery stenosis. - Possible PCNL in the future    4: HTN- - BP 146/99 - adding spiro 12.5mg  daily per above - to see PCP at [facility] 07/21/23 - BMP 06/14/23 showed sodium 133, potassium 3.7, creatinine 1.12 & GFR >60  Follow up: ***  Thank you for involving pharmacy in this patient's care.  Enos Fling, PharmD, BCPS Phone - [phone] Clinical Pharmacist 07/18/2023 11:39 AM

## 2023-07-17 ENCOUNTER — Ambulatory Visit: Payer: Medicare HMO | Attending: Cardiovascular Disease | Admitting: Medical

## 2023-07-17 NOTE — Progress Notes (Deleted)
Cardiology Office Note:    Date:  07/17/2023   ID:  Ronnie Mann, DOB 07-25-1956, MRN [id number]  PCP:  Oneita Hurt, No  [facility] Cardiologist:  Julien Nordmann, MD  [facility] Electrophysiologist:  None   Referring MD: No ref. provider found   Chief Complaint: Hospital follow-up  History of Present Illness:    CASETON Mann is a 67 y.o. male with a hx of kidney stones, coronary artery disease status post recent non-STEMI with PCI to the pLAD and PCI/DES of the RCA (05/01/23), HFrEF (30-35%), ICM, hiatal hernia, and nephrolithiasis who presents for hospital follow-up.  Patient has a significant history of nephrolithiasis requiring multiple surgeries and lithotripsies in the past.  Patient was admitted in early July with right kidney pain and chest pain.  In the ED he had elevated lactic acid and WBCs given IV fluids.  Chest x-ray showed pulmonary edema and BNP came back at 538.  High-sensitivity troponin was 54, 34.  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  Echo showed EF of 30 to 35%.  He underwent heart cath and had successful balloon angioplasty to the proximal LAD with DES stent placement to the proximal RCA.  He was continued on statin, aspirin, Brilinta.  No beta-blocker was started due to hypotension.  Urology saw and he refused to repeat the stent placement.  He was started on antibiotics for pneumonia.  Patient presented back to the ER 05/16/2023 with shortness of breath, left kidney pain and hematuria.  Hemoglobin was 7.5, high sensitive troponin 65, BUN 34.  Patient was transferred 3 units PRBCs.  Patient was seen by urology and he again declined stent placement.  Aspirin was subsequently discontinued for hematuria.  Past Medical History:  Diagnosis Date   Coronary artery disease 05/01/2023   PCI/DES   HFrEF (heart failure with reduced ejection fraction) (HCC) 05/01/2023   Hiatal hernia    Ischemic cardiomyopathy  05/01/2023   LVEF 30-35%   Kidney stones     Past Surgical History:  Procedure Laterality Date   CORONARY STENT INTERVENTION N/A 05/01/2023   Procedure: CORONARY STENT INTERVENTION;  Surgeon: Iran Ouch, MD;  Location: [facility] INVASIVE CV LAB;  Service: Cardiovascular;  Laterality: N/A;   LEFT HEART CATH AND CORONARY ANGIOGRAPHY N/A 05/01/2023   Procedure: LEFT HEART CATH AND CORONARY ANGIOGRAPHY;  Surgeon: Iran Ouch, MD;  Location: [facility] INVASIVE CV LAB;  Service: Cardiovascular;  Laterality: N/A;    Current Medications: No outpatient medications have been marked as taking for the 07/17/23 encounter (Appointment) with Fransico Michael, Candida Vetter H, PA-C.     Allergies:   Losartan   Social History   Socioeconomic History   Marital status: Divorced    Spouse name: Not on file   Number of children: Not on file   Years of education: Not on file   Highest education level: Not on file  Occupational History   Not on file  Tobacco Use   Smoking status: Never    Passive exposure: Never   Smokeless tobacco: Never  Substance and Sexual Activity   Alcohol use: Never   Drug use: Never   Sexual activity: Not on file  Other Topics Concern   Not on file  Social History Narrative   Not on file   Social Determinants of Health   Financial Resource Strain: Not on file  Food Insecurity: No Food Insecurity (05/16/2023)   Hunger Vital Sign    Worried About  Running Out of Food in the Last Year: Never true    Ran Out of Food in the Last Year: Never true  Transportation Needs: No Transportation Needs (05/16/2023)   PRAPARE - Administrator, Civil Service (Medical): No    Lack of Transportation (Non-Medical): No  Physical Activity: Not on file  Stress: Not on file  Social Connections: Not on file     Family History: The patient's ***family history is not on file.  ROS:   Please see the history of present illness.    *** All other systems reviewed and are  negative.  EKGs/Labs/Other Studies Reviewed:    The following studies were reviewed today: ***  EKG:  EKG is *** ordered today.  The ekg ordered today demonstrates ***  Recent Labs: 04/28/2023: TSH 4.483 05/05/2023: Magnesium 2.3 05/16/2023: ALT 22; B Natriuretic Peptide 245.5 05/27/2023: Platelets 145 06/14/2023: Hemoglobin 8.4 07/07/2023: BUN 20; Creatinine, Ser 1.05; Potassium 4.3; Sodium 135  Recent Lipid Panel    Component Value Date/Time   CHOL 141 05/06/2023 0820   TRIG 227 (H) 05/06/2023 0820   HDL 24 (L) 05/06/2023 0820   CHOLHDL 5.9 05/06/2023 0820   VLDL 45 (H) 05/06/2023 0820   LDLCALC 72 05/06/2023 0820     Risk Assessment/Calculations:   {Does this patient have ATRIAL FIBRILLATION?:[phone]}   Physical Exam:    VS:  There were no vitals taken for this visit.    Wt Readings from Last 3 Encounters:  06/30/23 201 lb (91.2 kg)  06/14/23 206 lb (93.4 kg)  06/09/23 207 lb (93.9 kg)     GEN: *** Well nourished, well developed in no acute distress HEENT: Normal NECK: No JVD; No carotid bruits LYMPHATICS: No lymphadenopathy CARDIAC: ***RRR, no murmurs, rubs, gallops RESPIRATORY:  Clear to auscultation without rales, wheezing or rhonchi  ABDOMEN: Soft, non-tender, non-distended MUSCULOSKELETAL:  No edema; No deformity  SKIN: Warm and dry NEUROLOGIC:  Alert and oriented x 3 PSYCHIATRIC:  Normal affect   ASSESSMENT:    No diagnosis found. PLAN:    In order of problems listed above:  ***  Disposition: Follow up {follow up:15908} with ***   Shared Decision Making/Informed Consent   {Are you ordering a CV Procedure (e.g. stress test, cath, DCCV, TEE, etc)?   Press F2        :[id number]}    Signed, Aarian Cleaver David Stall, PA-C  07/17/2023 1:11 PM    [facility]

## 2023-07-18 ENCOUNTER — Encounter: Payer: Self-pay | Admitting: Medical

## 2023-07-18 ENCOUNTER — Ambulatory Visit: Payer: Medicare HMO | Admitting: Pharmacist

## 2023-07-25 ENCOUNTER — Ambulatory Visit: Payer: Medicare HMO | Attending: Family Medicine | Admitting: Pharmacist

## 2023-07-25 ENCOUNTER — Other Ambulatory Visit: Payer: Self-pay

## 2023-07-25 MED ORDER — ENTRESTO 24-26 MG PO TABS
1.0000 | ORAL_TABLET | Freq: Two times a day (BID) | ORAL | 3 refills | Status: DC
  Filled 2023-07-25: qty 60, 30d supply, fill #0

## 2023-07-25 NOTE — Progress Notes (Signed)
Advanced Heart Failure Clinic Note  PCP: Pcp, No  PCP-Cardiologist: Julien Nordmann, MD  HF-Cardiologist: Dr. Dorthula Nettles  HPI:  Ronnie Mann is a 67 y/o male with a history of NSTEMI, s/p PCI to proximal LAD and RCA on 05/01/23, HFrEF(EF 30 to 35%), previous tobacco/ alcohol use, kidney stones since the age of 19 and bilateral nephrolithiasis.   Admitted 04/28/23 due to new onset of substernal "vice like" substernal chest pain that radiated to his left arm and shortness of breath. Developed flash pulmonary edema during left heart cath 06/10 and was treated with nitroglycerin drip. Status post successful balloon angioplasty to the proximal LAD and drug-eluting stent placement to the proximal right coronary artery.   Echo 04/30/23 showed LVEF 30-35% along with mild LVH and Grade II DD and moderate Ronnie. Seen by Advanced HF team during admission on 05/03/23.  Visited ED 05/13/23 due to bilateral flank and suprapubic pain. CT renal was stable.   Admitted 05/16/23 due to shortness of breath, bilateral flank pain radiating towards lower abdomen, more on right than left, dysuria and gross hematuria. Patient deferred urology stent placement. Able to void after foley removed. Brilinta was switched to Plavix, aspirin discontinued in the setting of hematuria by cardiologist     Seen in CHF clinic on 06/09/23 where losartan 25 mg daily was added, however losartan was stopped shortly after due to complaints of chest pain/palpitations.   CHF clinic on 06/30/23 where spironolactone 12.5 mg daily was added and metoprolol was increased to 25 mg daily.   Today Ronnie Mann returns to Heart Failure Clinic for pharmacist medication titration. Reports feeling relatively poor, however states that symptoms have improved since hospital discharge. Reports some lightheadedness when experiencing kidney stone pain, fatigue daily that is unchanged from discharge, palpitations when daily when anxious that is unchanged from  discharge, SOB that has worsened over the last couple of days with a cold, mild 2 pillow orthopnea and PND that is unchanged from discharge limiting sleep. Reports yellow mucous when blowing nose. Denies LEE and orthostasis. Reports being able to complete all activities of daily living (ADLs) with breaks. Is minimally active throughout the day. Weight at home is around 210 pounds, but has has not weighed in ~1 month. Takes no loop diuretic. Appetite has improved significantly. Does follow a low sodium diet. Has not been taking metoprolol or spironolactone as prescribed.   Current HF Medications: Beta Blocker: Metoprolol succinate 25 mg daily Mineralocorticoid Receptor Antagonist (MRA): Spironolactone 12.5 mg daily  Has the patient been experiencing any side effects to the medications prescribed? No current ADEs. Reports feeling "hyper with racing heart" when he took losartan. Denies any swelling, rash, or other allergic symptoms. Experienced lightheadedness when accidentally taking too much of his metoprolol.  Does the patient have any problems obtaining medications due to transportation or finances? No  Understanding of regimen: Fair  Understanding of indications: Fair  Potential of adherence: Fair. Reports missing two doses last week. Misunderstood instructions at last CHF visit and has been taking metoprolol 37.5 mg daily instead of 25 mg daily. Although spironolactone 12.5 mg daily was prescribed, pt has been taking 25 mg daily since he first filled the medication. Labs after last CHF visit reflect the 25 mg dose.  Patient understands to avoid NSAIDs.  Patient understands to avoid decongestants. Noted allergy symptoms today and recommended Zyrtec 10 mg daily.  Pertinent Lab Values: Creatinine, Ser  Date Value Ref Range Status  07/07/2023 1.05 0.61 - 1.24 mg/dL  Final   BUN  Date Value Ref Range Status  07/07/2023 20 8 - 23 mg/dL Final   Potassium  Date Value Ref Range Status   07/07/2023 4.3 3.5 - 5.1 mmol/L Final   Sodium  Date Value Ref Range Status  07/07/2023 135 135 - 145 mmol/L Final   B Natriuretic Peptide  Date Value Ref Range Status  05/16/2023 245.5 (H) 0.0 - 100.0 pg/mL Final    Comment:    Performed at [facility], [address] [place], [place] [address]   Magnesium  Date Value Ref Range Status  05/05/2023 2.3 1.7 - 2.4 mg/dL Final    Comment:    Performed at [facility], [address] [place], [place] [address]   TSH  Date Value Ref Range Status  04/28/2023 4.483 0.350 - 4.500 uIU/mL Final    Comment:    Performed by a 3rd Generation assay with a functional sensitivity of <=0.01 uIU/mL. Performed at [facility], [address] [place], [place] [address]     Vital Signs: There were no vitals filed for this visit.  Assessment/Plan: 1: Heart failure with reduced ejection fraction due to ischemic cardiomyopathy. - NSTEMI with PCI 04/2023 - NYHA class II-III. Able to complete all ADLs, but sometimes requires breaks. Breathing complicated by cold-like symptoms today.  - Euvolemic. REDS 33% in clinic today. Is not weighing daily. Encouraged to start and reminded to call for an overnight weight gain of > 2 pounds or a weekly weight gain of > 5 pounds. Weight today ~1 lb up from last clinic visit. - Echo 04/30/23: EF 30-35% along with mild LVH and Grade II DD and moderate Ronnie.  - Decrease metoprolol to 25 mg daily as originally intended. Patient began to feel worse after accidentally increasing metoprolol to 37.5 mg daily. Notably, has moderate Ronnie. - Continue spironolactone 25 mg daily. Previous labs were actually while taking spironolactone 25 mg daily. - Start Entresto 24-26 mg 1/2 tablet twice daily. If tolerated, may increase to 1 tablet twice daily. Educated to reach out if experiencing any symptoms. Suspect intolerance of losartan may be due to anxiety. Verified patient does not have bilateral renal  artery stenosis or renal tubes. - Due to chronic kidney stones etc, may not be a good candidate for SGLT2 - BNP 05/16/23 was 245.5    2: CAD- - continue atorvastatin 40mg  daily; refilled today - continue ASA 81mg  daily - LHC 05/01/23:   Prox LAD to Mid LAD lesion is 99% stenosed.   Mid LAD lesion is 60% stenosed.   1st Diag lesion is 70% stenosed.   Prox RCA lesion is 95% stenosed.   Mid RCA lesion is 30% stenosed.   RPDA lesion is 60% stenosed.   Dist LAD lesion is 99% stenosed.   A drug-eluting stent was successfully placed using a STENT ONYX FRONTIER 4.0X15.   Balloon angioplasty was performed using a BALLN [place] EUPHORA RX 2.75X20.   Post intervention, there is a 20% residual stenosis.   Post intervention, there is a 0% residual stenosis.   There is severe left ventricular systolic dysfunction.   LV end diastolic pressure is severely elevated.   There is moderate (3+) mitral regurgitation.  1.  Severe two-vessel coronary artery disease with subtotal occlusion of the proximal LAD, diffuse moderate mid LAD disease and severe distal LAD disease.  In addition, there is 95% in the proximal right coronary artery with faint right to left collaterals to the LAD.  The left circumflex has mild nonobstructive disease and OM 3 is large and reaches the apex. 2.  Severely reduced LV systolic function with an EF of 25%.  Severely elevated left ventricular end-diastolic pressure at 34 mmHg. 3.  Successful balloon angioplasty to the proximal LAD and drug-eluting stent placement to the proximal right coronary artery.   3: Kidney disease- - seen by urology Richardo Hanks) 05/2023. No urinary stenting planned at this time. - Has bilateral nonobstructive staghorn renal stones. No history of renal artery stenosis. - Possible PCNL in the future    4: HTN- - BP at goal today. - Adding Entresto as above - to see PCP at [facility] 07/21/23 - BMP 06/14/23 showed sodium 133, potassium 3.7, creatinine 1.12 &  GFR >60  Follow up: 08/22/23 in CHF clinic. Will obtain BMP at that time.  Thank you for involving pharmacy in this patient's care.  Enos Fling, PharmD, BCPS Phone - [phone] Clinical Pharmacist 07/25/2023 3:58 PM

## 2023-07-25 NOTE — Patient Instructions (Signed)
It was a pleasure seeing you today!  MEDICATIONS: -We are changing your medications today -Start Entresto 24-26 mg twice daily. Begin by taking one-half of a tablet two times per day, then increase to 1 tablet two times per day if you are not having issues. -Start taking metoprolol 25 mg one tablet daily -Call if you have questions about your medications.  LABS: -We will call you if your labs need attention.  NEXT APPOINTMENT: Return to clinic on 08/22/23   In general, to take care of your heart failure: -Limit your fluid intake to 2 Liters (half-gallon) per day.   -Limit your salt intake to ideally 2-3 grams (2000-3000 mg) per day. -Weigh yourself daily and record, and bring that "weight diary" to your next appointment.  (Weight gain of 2-3 pounds in 1 day typically means fluid weight.) -The medications for your heart are to help your heart and help you live longer.   -Please contact us before stopping any of your heart medications.  Call the clinic at [phone] with questions or to reschedule future appointments.

## 2023-08-15 ENCOUNTER — Other Ambulatory Visit: Payer: Self-pay

## 2023-08-15 ENCOUNTER — Other Ambulatory Visit: Payer: Self-pay | Admitting: Urology

## 2023-08-15 NOTE — Telephone Encounter (Signed)
Please advise, pt requesting refill

## 2023-08-16 ENCOUNTER — Other Ambulatory Visit: Payer: Self-pay

## 2023-08-18 NOTE — Progress Notes (Deleted)
Advanced Heart Failure Clinic Note  PCP: Pcp, No  PCP-Cardiologist: Julien Nordmann, MD  HF-Cardiologist: Dr. Dorthula Nettles  HPI:  Ronnie Mann is a 67 y/o male with a history of NSTEMI, s/p PCI to proximal LAD and RCA on 05/01/23, HFrEF(EF 30 to 35%), previous tobacco/ alcohol use, kidney stones since the age of 55 and bilateral nephrolithiasis.   Admitted 04/28/23 due to new onset of substernal "vice like" substernal chest pain that radiated to his left arm and shortness of breath. Developed flash pulmonary edema during left heart cath 06/10 and was treated with nitroglycerin drip. Status post successful balloon angioplasty to the proximal LAD and drug-eluting stent placement to the proximal right coronary artery.   Echo 04/30/23 showed LVEF 30-35% along with mild LVH and Grade II DD and moderate Ronnie. Seen by Advanced HF team during admission on 05/03/23.  Visited ED 05/13/23 due to bilateral flank and suprapubic pain. CT renal was stable.   Admitted 05/16/23 due to shortness of breath, bilateral flank pain radiating towards lower abdomen, more on right than left, dysuria and gross hematuria. Patient deferred urology stent placement. Able to void after foley removed. Brilinta was switched to Plavix, aspirin discontinued in the setting of hematuria by cardiologist     Seen in CHF clinic on 06/09/23 where losartan 25 mg daily was added, however losartan was stopped shortly after due to complaints of chest pain/palpitations.   CHF clinic on 06/30/23 where spironolactone 12.5 mg daily was added and metoprolol was increased to 25 mg daily.   Seen by CHF pharmacist on 07/25/2023 Patient was started on Entresto 24-26 mg BID.  Today Ronnie Mann returns to Heart Failure Clinic for pharmacist medication titration. Reports feeling relatively poor, however states that symptoms have improved since hospital discharge. Reports some lightheadedness when experiencing kidney stone pain, fatigue daily that is  unchanged from discharge, palpitations when daily when anxious that is unchanged from discharge, SOB that has worsened over the last couple of days with a cold, mild 2 pillow orthopnea and PND that is unchanged from discharge limiting sleep. Reports yellow mucous when blowing nose. Denies LEE and orthostasis. Reports being able to complete all activities of daily living (ADLs) with breaks. Is minimally active throughout the day. Weight at home is around 210 pounds, but has has not weighed in ~1 month. Takes no loop diuretic. Appetite has improved significantly. Does follow a low sodium diet. Has not been taking metoprolol or spironolactone as prescribed.   Current HF Medications: Metoprolol succinate 25 mg daily Entresto 24-26 mg BID Spironolactone 12.5 mg daily  Has the patient been experiencing any side effects to the medications prescribed? No current ADEs. Reports feeling "hyper with racing heart" when he took losartan. Denies any swelling, rash, or other allergic symptoms. Experienced lightheadedness when accidentally taking too much of his metoprolol.  Does the patient have any problems obtaining medications due to transportation or finances? No  Understanding of regimen: Fair  Understanding of indications: Fair  Potential of adherence: Fair. Reports missing two doses last week. Misunderstood instructions at last CHF visit and has been taking metoprolol 37.5 mg daily instead of 25 mg daily. Although spironolactone 12.5 mg daily was prescribed, pt has been taking 25 mg daily since he first filled the medication. Labs after last CHF visit reflect the 25 mg dose.  Patient understands to avoid NSAIDs.  Patient understands to avoid decongestants. Noted allergy symptoms today and recommended Zyrtec 10 mg daily.  Pertinent Lab Values: Creatinine, Ser  Date Value Ref Range Status  07/07/2023 1.05 0.61 - 1.24 mg/dL Final   BUN  Date Value Ref Range Status  07/07/2023 20 8 - 23 mg/dL Final    Potassium  Date Value Ref Range Status  07/07/2023 4.3 3.5 - 5.1 mmol/L Final   Sodium  Date Value Ref Range Status  07/07/2023 135 135 - 145 mmol/L Final   B Natriuretic Peptide  Date Value Ref Range Status  05/16/2023 245.5 (H) 0.0 - 100.0 pg/mL Final    Comment:    Performed at [facility], [address] [place], [place] [address]   Magnesium  Date Value Ref Range Status  05/05/2023 2.3 1.7 - 2.4 mg/dL Final    Comment:    Performed at [facility], [address] [place], [place] [address]   TSH  Date Value Ref Range Status  04/28/2023 4.483 0.350 - 4.500 uIU/mL Final    Comment:    Performed by a 3rd Generation assay with a functional sensitivity of <=0.01 uIU/mL. Performed at [facility], [address], [place], [place] [address]     Vital Signs: There were no vitals filed for this visit.  Assessment/Plan: 1: Heart failure with reduced ejection fraction due to ischemic cardiomyopathy. - NSTEMI with PCI 04/2023 - NYHA class II-III. Able to complete all ADLs, but sometimes requires breaks. Breathing complicated by cold-like symptoms today.  - Euvolemic. REDS 33% in clinic today. Is not weighing daily. Encouraged to start and reminded to call for an overnight weight gain of > 2 pounds or a weekly weight gain of > 5 pounds. Weight today ~1 lb up from last clinic visit. - Echo 04/30/23: EF 30-35% along with mild LVH and Grade II DD and moderate Ronnie.  - Decrease metoprolol to 25 mg daily as originally intended. Patient began to feel worse after accidentally increasing metoprolol to 37.5 mg daily. Notably, has moderate Ronnie. - Continue spironolactone 25 mg daily. Previous labs were actually while taking spironolactone 25 mg daily. - Start Entresto 24-26 mg 1/2 tablet twice daily. If tolerated, may increase to 1 tablet twice daily. Educated to reach out if experiencing any symptoms. Suspect intolerance of losartan may be due to anxiety.  Verified patient does not have bilateral renal artery stenosis or renal tubes. - Due to chronic kidney stones etc, is not an ideal candidate for SGLT2 - BNP 05/16/23 was 245.5    2: CAD- - continue atorvastatin 40mg  daily; refilled today - continue ASA 81mg  daily - LHC 05/01/23:   Prox LAD to Mid LAD lesion is 99% stenosed.   Mid LAD lesion is 60% stenosed.   1st Diag lesion is 70% stenosed.   Prox RCA lesion is 95% stenosed.   Mid RCA lesion is 30% stenosed.   RPDA lesion is 60% stenosed.   Dist LAD lesion is 99% stenosed.   A drug-eluting stent was successfully placed using a STENT ONYX FRONTIER 4.0X15.   Balloon angioplasty was performed using a BALLN [place] EUPHORA RX 2.75X20.   Post intervention, there is a 20% residual stenosis.   Post intervention, there is a 0% residual stenosis.   There is severe left ventricular systolic dysfunction.   LV end diastolic pressure is severely elevated.   There is moderate (3+) mitral regurgitation.  1.  Severe two-vessel coronary artery disease with subtotal occlusion of the proximal LAD, diffuse moderate mid LAD disease and severe distal LAD disease.  In addition, there is 95% in the proximal right coronary  artery with faint right to left collaterals to the LAD.  The left circumflex has mild nonobstructive disease and OM 3 is large and reaches the apex. 2.  Severely reduced LV systolic function with an EF of 25%.  Severely elevated left ventricular end-diastolic pressure at 34 mmHg. 3.  Successful balloon angioplasty to the proximal LAD and drug-eluting stent placement to the proximal right coronary artery.   3: Kidney disease- - seen by urology Richardo Hanks) 05/2023. No urinary stenting planned at this time. - Has bilateral nonobstructive staghorn renal stones. No history of renal artery stenosis. - Possible PCNL in the future    4: HTN- - BP at goal today. - Adding Entresto as above - to see PCP at [facility] 07/21/23 - BMP 06/14/23 showed  sodium 133, potassium 3.7, creatinine 1.12 & GFR >60  Follow up: 08/22/23 in CHF clinic. Will obtain BMP at that time.  Thank you for involving pharmacy in this patient's care.  Enos Fling, PharmD, BCPS Phone - [phone] Clinical Pharmacist 08/18/2023 1:01 PM

## 2023-08-22 ENCOUNTER — Other Ambulatory Visit: Payer: Medicare HMO

## 2023-08-25 ENCOUNTER — Other Ambulatory Visit (HOSPITAL_COMMUNITY): Payer: Self-pay

## 2023-08-25 NOTE — Progress Notes (Deleted)
Advanced Heart Failure Clinic Note  PCP: Pcp, No  PCP-Cardiologist: Julien Nordmann, MD  HF-Cardiologist: Dr. Dorthula Nettles  HPI:  Ronnie Mann is a 67 y/o male with a history of NSTEMI, s/p PCI to proximal LAD and RCA on 05/01/23, HFrEF(EF 30 to 35%), previous tobacco/ alcohol use, kidney stones since the age of 45 and bilateral nephrolithiasis.   Admitted 04/28/23 due to new onset of substernal "vice like" substernal chest pain that radiated to his left arm and shortness of breath. Developed flash pulmonary edema during left heart cath 06/10 and was treated with nitroglycerin drip. Status post successful balloon angioplasty to the proximal LAD and drug-eluting stent placement to the proximal right coronary artery.   Echo 04/30/23 showed LVEF 30-35% along with mild LVH and Grade II DD and moderate Ronnie. Seen by Advanced HF team during admission on 05/03/23.  Visited ED 05/13/23 due to bilateral flank and suprapubic pain. CT renal was stable.   Admitted 05/16/23 due to shortness of breath, bilateral flank pain radiating towards lower abdomen, more on right than left, dysuria and gross hematuria. Patient deferred urology stent placement. Able to void after foley removed. Brilinta was switched to Plavix, aspirin discontinued in the setting of hematuria by cardiologist     Seen in CHF clinic on 06/09/23 where losartan 25 mg daily was added, however losartan was stopped shortly after due to complaints of chest pain/palpitations.   CHF clinic on 06/30/23 where spironolactone 12.5 mg daily was added and metoprolol was increased to 25 mg daily.   Seen in CHF pharmacy clinic on 07/24/23 where patient reported accidentally increasing metoprolol to 37.5 mg daily and began to feel poorly. The dose was reduced back to 25 mg daily as intended and Entresto 24-26 mg bid was started.   Today Ronnie Mann returns to Heart Failure Clinic for pharmacist medication titration. Reports feeling relatively poor, however  states that symptoms have improved since hospital discharge. Reports some lightheadedness when experiencing kidney stone pain, fatigue daily that is unchanged from discharge, palpitations when daily when anxious that is unchanged from discharge, SOB that has worsened over the last couple of days with a cold, mild 2 pillow orthopnea and PND that is unchanged from discharge limiting sleep. Reports yellow mucous when blowing nose. Denies LEE and orthostasis. Reports being able to complete all activities of daily living (ADLs) with breaks. Is minimally active throughout the day. Weight at home is around 210 pounds, but has has not weighed in ~1 month. Takes no loop diuretic. Appetite has improved significantly. Does follow a low sodium diet. Has not been taking metoprolol or spironolactone as prescribed.   Current HF Medications: Beta Blocker: Metoprolol succinate 25 mg daily ACEI/ARB/ARNI: Entresto 24-26 mg bid Mineralocorticoid Receptor Antagonist (MRA): Spironolactone 25 mg daily  Has the patient been experiencing any side effects to the medications prescribed? No current ADEs. Reports feeling "hyper with racing heart" when he took losartan. Denies any swelling, rash, or other allergic symptoms. Experienced lightheadedness when accidentally taking too much of his metoprolol.  Does the patient have any problems obtaining medications due to transportation or finances? No  Understanding of regimen: Fair  Understanding of indications: Fair  Potential of adherence: Fair. Reports missing two doses last week. Misunderstood instructions at last CHF visit and has been taking metoprolol 37.5 mg daily instead of 25 mg daily. Although spironolactone 12.5 mg daily was prescribed, pt has been taking 25 mg daily since he first filled the medication. Labs after last CHF  visit reflect the 25 mg dose.  Patient understands to avoid NSAIDs.  Patient understands to avoid decongestants. Noted allergy symptoms today and  recommended Zyrtec 10 mg daily.  Pertinent Lab Values: Creatinine, Ser  Date Value Ref Range Status  07/07/2023 1.05 0.61 - 1.24 mg/dL Final   BUN  Date Value Ref Range Status  07/07/2023 20 8 - 23 mg/dL Final   Potassium  Date Value Ref Range Status  07/07/2023 4.3 3.5 - 5.1 mmol/L Final   Sodium  Date Value Ref Range Status  07/07/2023 135 135 - 145 mmol/L Final   B Natriuretic Peptide  Date Value Ref Range Status  05/16/2023 245.5 (H) 0.0 - 100.0 pg/mL Final    Comment:    Performed at [facility] [address] [place], [place] [address]   Magnesium  Date Value Ref Range Status  05/05/2023 2.3 1.7 - 2.4 mg/dL Final    Comment:    Performed at [facility], [address] [place], [place] [address]   TSH  Date Value Ref Range Status  04/28/2023 4.483 0.350 - 4.500 uIU/mL Final    Comment:    Performed by a 3rd Generation assay with a functional sensitivity of <=0.01 uIU/mL. Performed at [facility], [address] [place], [place] [address]     Vital Signs: There were no vitals filed for this visit.  Assessment/Plan: 1: Heart failure with reduced ejection fraction due to ischemic cardiomyopathy. - NSTEMI with PCI 04/2023 - NYHA class II-III. Able to complete all ADLs, but sometimes requires breaks. Breathing complicated by cold-like symptoms today.  - Euvolemic. REDS 33% in clinic today. Is not weighing daily. Encouraged to start and reminded to call for an overnight weight gain of > 2 pounds or a weekly weight gain of > 5 pounds. Weight today ~1 lb up from last clinic visit. - Echo 04/30/23: EF 30-35% along with mild LVH and Grade II DD and moderate Ronnie.  - Decrease metoprolol to 25 mg daily as originally intended. Patient began to feel worse after accidentally increasing metoprolol to 37.5 mg daily. Notably, has moderate Ronnie. - Continue spironolactone 25 mg daily. Previous labs were actually while taking spironolactone 25 mg  daily. - Start Entresto 24-26 mg 1/2 tablet twice daily. If tolerated, may increase to 1 tablet twice daily. Educated to reach out if experiencing any symptoms. Suspect intolerance of losartan may be due to anxiety. Verified patient does not have bilateral renal artery stenosis or renal tubes. - Due to chronic kidney stones etc, may not be a good candidate for SGLT2 - BNP 05/16/23 was 245.5    2: CAD- - continue atorvastatin 40mg  daily; refilled today - continue ASA 81mg  daily - LHC 05/01/23:   Prox LAD to Mid LAD lesion is 99% stenosed.   Mid LAD lesion is 60% stenosed.   1st Diag lesion is 70% stenosed.   Prox RCA lesion is 95% stenosed.   Mid RCA lesion is 30% stenosed.   RPDA lesion is 60% stenosed.   Dist LAD lesion is 99% stenosed.   A drug-eluting stent was successfully placed using a STENT ONYX FRONTIER 4.0X15.   Balloon angioplasty was performed using a BALLN [place] EUPHORA RX 2.75X20.   Post intervention, there is a 20% residual stenosis.   Post intervention, there is a 0% residual stenosis.   There is severe left ventricular systolic dysfunction.   LV end diastolic pressure is severely elevated.   There is moderate (3+) mitral regurgitation.  1.  Severe two-vessel coronary artery disease with subtotal occlusion of the proximal LAD, diffuse moderate mid LAD disease and severe distal LAD disease.  In addition, there is 95% in the proximal right coronary artery with faint right to left collaterals to the LAD.  The left circumflex has mild nonobstructive disease and OM 3 is large and reaches the apex. 2.  Severely reduced LV systolic function with an EF of 25%.  Severely elevated left ventricular end-diastolic pressure at 34 mmHg. 3.  Successful balloon angioplasty to the proximal LAD and drug-eluting stent placement to the proximal right coronary artery.   3: Kidney disease- - seen by urology Richardo Hanks) 05/2023. No urinary stenting planned at this time. - Has bilateral nonobstructive  staghorn renal stones. No history of renal artery stenosis. - Possible PCNL in the future    4: HTN- - BP at goal today. - Adding Entresto as above - to see PCP at [facility] 07/21/23 - BMP 06/14/23 showed sodium 133, potassium 3.7, creatinine 1.12 & GFR >60  Follow up: 08/22/23 in CHF clinic. Will obtain BMP at that time.  Thank you for involving pharmacy in this patient's care.  Enos Fling, PharmD, BCPS Phone - [phone] Clinical Pharmacist 08/25/2023 11:26 AM

## 2023-08-29 ENCOUNTER — Other Ambulatory Visit: Payer: Medicare HMO

## 2023-08-30 ENCOUNTER — Ambulatory Visit: Payer: Medicare HMO | Attending: Cardiology | Admitting: Pharmacist

## 2023-08-30 ENCOUNTER — Other Ambulatory Visit: Payer: Self-pay

## 2023-08-30 VITALS — BP 154/80 | HR 95 | Wt 201.8 lb

## 2023-08-30 DIAGNOSIS — I5022 Chronic systolic (congestive) heart failure: Secondary | ICD-10-CM

## 2023-08-30 MED ORDER — SPIRONOLACTONE 25 MG PO TABS
25.0000 mg | ORAL_TABLET | Freq: Every day | ORAL | 3 refills | Status: DC
  Filled 2023-08-30: qty 45, 45d supply, fill #0
  Filled 2023-10-27: qty 45, 45d supply, fill #1

## 2023-08-30 MED ORDER — CEFADROXIL 500 MG PO CAPS
500.0000 mg | ORAL_CAPSULE | Freq: Two times a day (BID) | ORAL | 0 refills | Status: DC
  Filled 2023-08-30: qty 10, 5d supply, fill #0

## 2023-08-30 MED ORDER — AZITHROMYCIN 250 MG PO TABS
ORAL_TABLET | ORAL | 0 refills | Status: AC
  Filled 2023-08-30: qty 6, 5d supply, fill #0

## 2023-08-30 NOTE — Progress Notes (Signed)
Advanced Heart Failure Clinic Note  PCP: Pcp, No  PCP-Cardiologist: Julien Nordmann, MD  HF-Cardiologist: Dr. Dorthula Nettles  HPI:  [name] is a 67 y/o male with a history of NSTEMI, s/p PCI to proximal LAD and RCA on 05/01/23, HFrEF(EF 30 to 35%), previous tobacco/ alcohol use, kidney stones since the age of 9 and bilateral nephrolithiasis.   Admitted 04/28/23 due to new onset of substernal "vice like" substernal chest pain that radiated to his left arm and shortness of breath. Developed flash pulmonary edema during left heart cath 06/10 and was treated with nitroglycerin drip. Status post successful balloon angioplasty to the proximal LAD and drug-eluting stent placement to the proximal right coronary artery.   Echo 04/30/23 showed LVEF 30-35% along with mild LVH and Grade II DD and moderate MR. Seen by Advanced HF team during admission on 05/03/23.  Visited ED 05/13/23 due to bilateral flank and suprapubic pain. CT renal was stable.   Admitted 05/16/23 due to shortness of breath, bilateral flank pain radiating towards lower abdomen, more on right than left, dysuria and gross hematuria. Patient deferred urology stent placement. Able to void after foley removed. Brilinta was switched to Plavix, aspirin discontinued in the setting of hematuria by cardiologist     Seen in CHF clinic on 06/09/23 where losartan 25 mg daily was added, however losartan was stopped shortly after due to complaints of chest pain/palpitations.   CHF clinic on 06/30/23 where spironolactone 12.5 mg daily was added and metoprolol was increased to 25 mg daily.   Seen in CHF pharmacy clinic on 07/24/23 where patient reported accidentally increasing metoprolol to 37.5 mg daily and began to feel poorly. The dose was reduced back to 25 mg daily as intended and Entresto 24-26 mg bid was started.   Today [name] returns to Heart Failure Clinic for pharmacist medication titration. Reports feeling relatively poor due to  cold-like symptoms. Reports coughing up yellow phlegm with nasal congestion, fatigue, worsening shortness of breath and orthopnea over the last week. Symptoms are leading to worsening of sleep quality. Also reports left arm soreness different from that of his ACS event. Denies chest pain, LEE, orthostasis, PND, and dyspnea. Reports being able to complete all activities of daily living (ADLs) with breaks. Is minimally active throughout the day. Weight at home is around 210 pounds, but has has not weighed in ~1 month. Takes no loop diuretic. Appetite has good. Does follow a low sodium diet. ReDs today is 26%.  Current HF Medications: Beta Blocker: Metoprolol succinate 25 mg daily ACEI/ARB/ARNI: Entresto 24-26 mg bid Mineralocorticoid Receptor Antagonist (MRA): Spironolactone 25 mg daily  Has the patient been experiencing any side effects to the medications prescribed? No current ADEs. Reports feeling "hyper with racing heart" when he took losartan. Denies any swelling, rash, or other allergic symptoms. Experienced lightheadedness when accidentally taking too much of his metoprolol.  Does the patient have any problems obtaining medications due to transportation or finances? No  Understanding of regimen: Fair  Understanding of indications: Fair  Potential of adherence: Fair. Reports taking mediations as directed now, except he missed all morning medications today.  Patient understands to avoid NSAIDs.  Patient understands to avoid decongestants. Noted allergy symptoms today and recommended Zyrtec 10 mg daily.  Pertinent Lab Values: Creatinine, Ser  Date Value Ref Range Status  07/07/2023 1.05 0.61 - 1.24 mg/dL Final   BUN  Date Value Ref Range Status  07/07/2023 20 8 - 23 mg/dL Final   Potassium  Date Value Ref Range Status  07/07/2023 4.3 3.5 - 5.1 mmol/L Final   Sodium  Date Value Ref Range Status  07/07/2023 135 135 - 145 mmol/L Final   B Natriuretic Peptide  Date Value Ref  Range Status  05/16/2023 245.5 (H) 0.0 - 100.0 pg/mL Final    Comment:    Performed at [facility], [address] [place], [place] [address]   Magnesium  Date Value Ref Range Status  05/05/2023 2.3 1.7 - 2.4 mg/dL Final    Comment:    Performed at [facility], [address] [place], [place] [address]   TSH  Date Value Ref Range Status  04/28/2023 4.483 0.350 - 4.500 uIU/mL Final    Comment:    Performed by a 3rd Generation assay with a functional sensitivity of <=0.01 uIU/mL. Performed at [facility], [address] [place], [place] [address]     Vital Signs: There were no vitals filed for this visit.  Assessment/Plan: 1: Heart failure with reduced ejection fraction due to ischemic cardiomyopathy. - NSTEMI with PCI 04/2023 - NYHA class III. Able to complete all ADLs, but is having shortness of breath with minimal exertion - Euvolemic. REDS 26% in clinic today. Is not weighing daily. Encouraged to start and reminded to call for an overnight weight gain of > 2 pounds or a weekly weight gain of > 5 pounds. Weight today similar to last clinic visit. - Echo 04/30/23: EF 30-35% along with mild LVH and Grade II DD and moderate MR.  - Continue metoprolol 25 mg daily  - Continue spironolactone 25 mg daily.  - Continue Entresto 24-26 mg. Tolerating well. Verified patient does not have bilateral renal artery stenosis or renal tubes. - Due to chronic kidney stones etc, may not be a good candidate for SGLT2 - BNP 05/16/23 was 245.5. Recheck BNP and BMP today.   2: CAD- - continue atorvastatin 40mg  daily  - continue ASA 81mg  daily - LHC 05/01/23:   Prox LAD to Mid LAD lesion is 99% stenosed.   Mid LAD lesion is 60% stenosed.   1st Diag lesion is 70% stenosed.   Prox RCA lesion is 95% stenosed.   Mid RCA lesion is 30% stenosed.   RPDA lesion is 60% stenosed.   Dist LAD lesion is 99% stenosed.   A drug-eluting stent was successfully placed using a STENT  ONYX FRONTIER 4.0X15.   Balloon angioplasty was performed using a BALLN [place] EUPHORA RX 2.75X20.   Post intervention, there is a 20% residual stenosis.   Post intervention, there is a 0% residual stenosis.   There is severe left ventricular systolic dysfunction.   LV end diastolic pressure is severely elevated.   There is moderate (3+) mitral regurgitation.  1.  Severe two-vessel coronary artery disease with subtotal occlusion of the proximal LAD, diffuse moderate mid LAD disease and severe distal LAD disease.  In addition, there is 95% in the proximal right coronary artery with faint right to left collaterals to the LAD.  The left circumflex has mild nonobstructive disease and OM 3 is large and reaches the apex. 2.  Severely reduced LV systolic function with an EF of 25%.  Severely elevated left ventricular end-diastolic pressure at 34 mmHg. 3.  Successful balloon angioplasty to the proximal LAD and drug-eluting stent placement to the proximal right coronary artery.   3: Kidney disease- - Has bilateral nonobstructive staghorn renal stones. No history of renal artery stenosis. - Seeing urology in ~1  week per patient report   4: HTN- - BP above goal today., however patient has not taken any medications. - See above   5. Community Acquired Pneumonia- - Evaluated by NP today and symptoms/lung sounds consistent with PNA.  - NP prescribed 5 days of cefadroxil and Z Pak  Follow up: with primary cardiology in ~3 months  Thank you for involving pharmacy in this patient's care.  Enos Fling, PharmD, BCPS Phone - [phone] Clinical Pharmacist 08/30/2023 1:58 PM

## 2023-08-30 NOTE — Patient Instructions (Addendum)
It was a pleasure seeing you today!  MEDICATIONS: -No medication changes today for heart failure. -Start cefadroxil 500 mg twice daily for 5 days along with azithromycin dose pack - use as directed on the box (2 pills the first day, then 1 daily until gone) -Can pick up over the counter antihistamine: loratadine OR cetirizine OR fexofenadine -Can pick up over the counter guaifenesin -Do not use Sudafed -Call if you have questions about your medications.  LABS: -We will call you if your labs need attention.  NEXT APPOINTMENT: Return to clinic in 3 months with [facility].  In general, to take care of your heart failure: -Limit your fluid intake to 2 Liters (half-gallon) per day.   -Limit your salt intake to ideally 2-3 grams (2000-3000 mg) per day. -Weigh yourself daily and record, and bring that "weight diary" to your next appointment.  (Weight gain of 2-3 pounds in 1 day typically means fluid weight.) -The medications for your heart are to help your heart and help you live longer.   -Please contact us before stopping any of your heart medications.  Call the clinic at [phone] with questions or to reschedule future appointments.

## 2023-08-31 LAB — BASIC METABOLIC PANEL
BUN/Creatinine Ratio: 21 (ref 10–24)
BUN: 26 mg/dL (ref 8–27)
CO2: 21 mmol/L (ref 20–29)
Calcium: 9.5 mg/dL (ref 8.6–10.2)
Chloride: 99 mmol/L (ref 96–106)
Creatinine, Ser: 1.22 mg/dL (ref 0.76–1.27)
Glucose: 133 mg/dL — ABNORMAL HIGH (ref 70–99)
Potassium: 4.7 mmol/L (ref 3.5–5.2)
Sodium: 136 mmol/L (ref 134–144)
eGFR: 65 mL/min/{1.73_m2} (ref >59)

## 2023-08-31 LAB — BRAIN NATRIURETIC PEPTIDE: BNP: 410.5 pg/mL — ABNORMAL HIGH (ref 0.0–100.0)

## 2023-10-10 ENCOUNTER — Other Ambulatory Visit: Payer: Self-pay

## 2023-10-10 ENCOUNTER — Inpatient Hospital Stay: Payer: Medicare HMO

## 2023-10-10 ENCOUNTER — Encounter: Payer: Self-pay | Admitting: Emergency Medicine

## 2023-10-10 ENCOUNTER — Emergency Department: Payer: Medicare HMO

## 2023-10-10 ENCOUNTER — Inpatient Hospital Stay
Admission: EM | Admit: 2023-10-10 | Discharge: 2023-10-14 | DRG: 988 | Disposition: A | Discharge status: Home / Self Care | Payer: Medicare HMO | Attending: Internal Medicine | Admitting: Internal Medicine

## 2023-10-10 DIAGNOSIS — Z87442 Personal history of urinary calculi: Secondary | ICD-10-CM

## 2023-10-10 DIAGNOSIS — E785 Hyperlipidemia, unspecified: Secondary | ICD-10-CM | POA: Diagnosis present

## 2023-10-10 DIAGNOSIS — Z888 Allergy status to other drugs, medicaments and biological substances status: Secondary | ICD-10-CM

## 2023-10-10 DIAGNOSIS — I10 Essential (primary) hypertension: Secondary | ICD-10-CM | POA: Diagnosis present

## 2023-10-10 DIAGNOSIS — E669 Obesity, unspecified: Secondary | ICD-10-CM | POA: Diagnosis present

## 2023-10-10 DIAGNOSIS — I255 Ischemic cardiomyopathy: Secondary | ICD-10-CM | POA: Diagnosis present

## 2023-10-10 DIAGNOSIS — D631 Anemia in chronic kidney disease: Secondary | ICD-10-CM | POA: Diagnosis present

## 2023-10-10 DIAGNOSIS — I2510 Atherosclerotic heart disease of native coronary artery without angina pectoris: Secondary | ICD-10-CM | POA: Diagnosis present

## 2023-10-10 DIAGNOSIS — I25118 Atherosclerotic heart disease of native coronary artery with other forms of angina pectoris: Secondary | ICD-10-CM | POA: Diagnosis present

## 2023-10-10 DIAGNOSIS — I252 Old myocardial infarction: Secondary | ICD-10-CM | POA: Diagnosis not present

## 2023-10-10 DIAGNOSIS — Z955 Presence of coronary angioplasty implant and graft: Secondary | ICD-10-CM

## 2023-10-10 DIAGNOSIS — I700 Atherosclerosis of aorta: Secondary | ICD-10-CM | POA: Diagnosis present

## 2023-10-10 DIAGNOSIS — I5A Non-ischemic myocardial injury (non-traumatic): Secondary | ICD-10-CM | POA: Diagnosis present

## 2023-10-10 DIAGNOSIS — I2489 Other forms of acute ischemic heart disease: Secondary | ICD-10-CM | POA: Diagnosis present

## 2023-10-10 DIAGNOSIS — Z6831 Body mass index (BMI) 31.0-31.9, adult: Secondary | ICD-10-CM

## 2023-10-10 DIAGNOSIS — N1832 Chronic kidney disease, stage 3b: Secondary | ICD-10-CM | POA: Diagnosis present

## 2023-10-10 DIAGNOSIS — J209 Acute bronchitis, unspecified: Principal | ICD-10-CM | POA: Diagnosis present

## 2023-10-10 DIAGNOSIS — Z79899 Other long term (current) drug therapy: Secondary | ICD-10-CM | POA: Diagnosis not present

## 2023-10-10 DIAGNOSIS — N136 Pyonephrosis: Principal | ICD-10-CM | POA: Diagnosis present

## 2023-10-10 DIAGNOSIS — Z7982 Long term (current) use of aspirin: Secondary | ICD-10-CM

## 2023-10-10 DIAGNOSIS — I452 Bifascicular block: Secondary | ICD-10-CM | POA: Diagnosis present

## 2023-10-10 DIAGNOSIS — I5042 Chronic combined systolic (congestive) and diastolic (congestive) heart failure: Secondary | ICD-10-CM | POA: Diagnosis present

## 2023-10-10 DIAGNOSIS — Z8249 Family history of ischemic heart disease and other diseases of the circulatory system: Secondary | ICD-10-CM

## 2023-10-10 DIAGNOSIS — R319 Hematuria, unspecified: Secondary | ICD-10-CM | POA: Diagnosis not present

## 2023-10-10 DIAGNOSIS — I5022 Chronic systolic (congestive) heart failure: Secondary | ICD-10-CM | POA: Diagnosis present

## 2023-10-10 DIAGNOSIS — Z7902 Long term (current) use of antithrombotics/antiplatelets: Secondary | ICD-10-CM

## 2023-10-10 DIAGNOSIS — N200 Calculus of kidney: Secondary | ICD-10-CM | POA: Diagnosis not present

## 2023-10-10 DIAGNOSIS — I959 Hypotension, unspecified: Secondary | ICD-10-CM | POA: Diagnosis not present

## 2023-10-10 DIAGNOSIS — N211 Calculus in urethra: Secondary | ICD-10-CM | POA: Diagnosis present

## 2023-10-10 DIAGNOSIS — N1339 Other hydronephrosis: Secondary | ICD-10-CM | POA: Diagnosis not present

## 2023-10-10 DIAGNOSIS — I130 Hypertensive heart and chronic kidney disease with heart failure and stage 1 through stage 4 chronic kidney disease, or unspecified chronic kidney disease: Secondary | ICD-10-CM | POA: Diagnosis present

## 2023-10-10 DIAGNOSIS — I5043 Acute on chronic combined systolic (congestive) and diastolic (congestive) heart failure: Secondary | ICD-10-CM | POA: Diagnosis present

## 2023-10-10 DIAGNOSIS — N1330 Unspecified hydronephrosis: Secondary | ICD-10-CM | POA: Diagnosis present

## 2023-10-10 DIAGNOSIS — R079 Chest pain, unspecified: Secondary | ICD-10-CM | POA: Diagnosis not present

## 2023-10-10 DIAGNOSIS — I428 Other cardiomyopathies: Secondary | ICD-10-CM | POA: Diagnosis not present

## 2023-10-10 DIAGNOSIS — N390 Urinary tract infection, site not specified: Secondary | ICD-10-CM | POA: Diagnosis not present

## 2023-10-10 DIAGNOSIS — I509 Heart failure, unspecified: Principal | ICD-10-CM

## 2023-10-10 DIAGNOSIS — I340 Nonrheumatic mitral (valve) insufficiency: Secondary | ICD-10-CM | POA: Diagnosis present

## 2023-10-10 DIAGNOSIS — K449 Diaphragmatic hernia without obstruction or gangrene: Secondary | ICD-10-CM | POA: Diagnosis present

## 2023-10-10 DIAGNOSIS — Z01810 Encounter for preprocedural cardiovascular examination: Secondary | ICD-10-CM | POA: Diagnosis not present

## 2023-10-10 DIAGNOSIS — E7849 Other hyperlipidemia: Secondary | ICD-10-CM | POA: Diagnosis not present

## 2023-10-10 DIAGNOSIS — N201 Calculus of ureter: Secondary | ICD-10-CM | POA: Diagnosis not present

## 2023-10-10 DIAGNOSIS — Z1152 Encounter for screening for COVID-19: Secondary | ICD-10-CM | POA: Diagnosis not present

## 2023-10-10 DIAGNOSIS — Z713 Dietary counseling and surveillance: Secondary | ICD-10-CM

## 2023-10-10 DIAGNOSIS — N189 Chronic kidney disease, unspecified: Secondary | ICD-10-CM | POA: Diagnosis present

## 2023-10-10 LAB — URINALYSIS, ROUTINE W REFLEX MICROSCOPIC
Bilirubin Urine: NEGATIVE
Glucose, UA: NEGATIVE mg/dL
Ketones, ur: NEGATIVE mg/dL
Nitrite: NEGATIVE
Protein, ur: 30 mg/dL — AB
RBC / HPF: >50 RBC/hpf (ref 0–5)
Specific Gravity, Urine: 1.006 (ref 1.005–1.030)
Squamous Epithelial / HPF: 0 /[HPF] (ref 0–5)
WBC, UA: >50 WBC/hpf (ref 0–5)
pH: 6 (ref 5.0–8.0)

## 2023-10-10 LAB — COMPREHENSIVE METABOLIC PANEL
ALT: 34 U/L (ref 0–44)
AST: 38 U/L (ref 15–41)
Albumin: 4.4 g/dL (ref 3.5–5.0)
Alkaline Phosphatase: 82 U/L (ref 38–126)
Anion gap: 7 (ref 5–15)
BUN: 37 mg/dL — ABNORMAL HIGH (ref 8–23)
CO2: 22 mmol/L (ref 22–32)
Calcium: 9 mg/dL (ref 8.9–10.3)
Chloride: 106 mmol/L (ref 98–111)
Creatinine, Ser: 1.33 mg/dL — ABNORMAL HIGH (ref 0.61–1.24)
GFR, Estimated: 59 mL/min — ABNORMAL LOW (ref >60)
Glucose, Bld: 145 mg/dL — ABNORMAL HIGH (ref 70–99)
Potassium: 4.1 mmol/L (ref 3.5–5.1)
Sodium: 135 mmol/L (ref 135–145)
Total Bilirubin: 1.3 mg/dL — ABNORMAL HIGH (ref <1.2)
Total Protein: 7.9 g/dL (ref 6.5–8.1)

## 2023-10-10 LAB — CBC WITH DIFFERENTIAL/PLATELET
Abs Immature Granulocytes: 0.02 10*3/uL (ref 0.00–0.07)
Basophils Absolute: 0.1 10*3/uL (ref 0.0–0.1)
Basophils Relative: 1 %
Eosinophils Absolute: 0.1 10*3/uL (ref 0.0–0.5)
Eosinophils Relative: 1 %
HCT: 32.6 % — ABNORMAL LOW (ref 39.0–52.0)
Hemoglobin: 10.4 g/dL — ABNORMAL LOW (ref 13.0–17.0)
Immature Granulocytes: 0 %
Lymphocytes Relative: 15 %
Lymphs Abs: 1.2 10*3/uL (ref 0.7–4.0)
MCH: 29 pg (ref 26.0–34.0)
MCHC: 31.9 g/dL (ref 30.0–36.0)
MCV: 90.8 fL (ref 80.0–100.0)
Monocytes Absolute: 0.8 10*3/uL (ref 0.1–1.0)
Monocytes Relative: 10 %
Neutro Abs: 5.6 10*3/uL (ref 1.7–7.7)
Neutrophils Relative %: 73 %
Platelets: 214 10*3/uL (ref 150–400)
RBC: 3.59 MIL/uL — ABNORMAL LOW (ref 4.22–5.81)
RDW: 18.6 % — ABNORMAL HIGH (ref 11.5–15.5)
WBC: 7.7 10*3/uL (ref 4.0–10.5)
nRBC: 0 % (ref 0.0–0.2)

## 2023-10-10 LAB — RESP PANEL BY RT-PCR (RSV, FLU A&B, COVID)  RVPGX2
Influenza A by PCR: NEGATIVE
Influenza B by PCR: NEGATIVE
Resp Syncytial Virus by PCR: NEGATIVE
SARS Coronavirus 2 by RT PCR: NEGATIVE

## 2023-10-10 LAB — URINE DRUG SCREEN, QUALITATIVE (ARMC ONLY)
Amphetamines, Ur Screen: NOT DETECTED
Barbiturates, Ur Screen: NOT DETECTED
Benzodiazepine, Ur Scrn: NOT DETECTED
Cannabinoid 50 Ng, Ur ~~LOC~~: POSITIVE — AB
Cocaine Metabolite,Ur ~~LOC~~: NOT DETECTED
MDMA (Ecstasy)Ur Screen: NOT DETECTED
Methadone Scn, Ur: NOT DETECTED
Opiate, Ur Screen: NOT DETECTED
Phencyclidine (PCP) Ur S: NOT DETECTED
Tricyclic, Ur Screen: NOT DETECTED

## 2023-10-10 LAB — MAGNESIUM: Magnesium: 2 mg/dL (ref 1.7–2.4)

## 2023-10-10 LAB — PROTIME-INR
INR: 1.1 (ref 0.8–1.2)
Prothrombin Time: 14.8 s (ref 11.4–15.2)

## 2023-10-10 LAB — TROPONIN I (HIGH SENSITIVITY)
Troponin I (High Sensitivity): 33 ng/L — ABNORMAL HIGH (ref <18)
Troponin I (High Sensitivity): 29 ng/L — ABNORMAL HIGH (ref <18)

## 2023-10-10 LAB — APTT: aPTT: 29 s (ref 24–36)

## 2023-10-10 LAB — BRAIN NATRIURETIC PEPTIDE: B Natriuretic Peptide: 975.7 pg/mL — ABNORMAL HIGH (ref 0.0–100.0)

## 2023-10-10 MED ORDER — HYDRALAZINE HCL 20 MG/ML IJ SOLN: 5.0000 mg | INTRAMUSCULAR | Status: DC | PRN

## 2023-10-10 MED ORDER — FUROSEMIDE 10 MG/ML IJ SOLN
40.0000 mg | Freq: Once | INTRAMUSCULAR | Status: AC
  Administered 2023-10-10: 40 mg via INTRAVENOUS
  Filled 2023-10-10: qty 4

## 2023-10-10 MED ORDER — MORPHINE SULFATE (PF) 2 MG/ML IV SOLN
2.0000 mg | INTRAVENOUS | Status: DC | PRN
  Administered 2023-10-10 – 2023-10-13 (×10): 2 mg via INTRAVENOUS
  Filled 2023-10-10 (×10): qty 1

## 2023-10-10 MED ORDER — NITROGLYCERIN 0.4 MG SL SUBL: 0.4000 mg | SUBLINGUAL_TABLET | SUBLINGUAL | Status: DC | PRN

## 2023-10-10 MED ORDER — SPIRONOLACTONE 25 MG PO TABS
25.0000 mg | ORAL_TABLET | Freq: Every day | ORAL | Status: DC
  Administered 2023-10-11 – 2023-10-14 (×3): 25 mg via ORAL
  Filled 2023-10-10 (×3): qty 1

## 2023-10-10 MED ORDER — METOPROLOL SUCCINATE ER 25 MG PO TB24
25.0000 mg | ORAL_TABLET | Freq: Every day | ORAL | Status: DC
  Administered 2023-10-11 – 2023-10-14 (×4): 25 mg via ORAL
  Filled 2023-10-10 (×4): qty 1

## 2023-10-10 MED ORDER — ONDANSETRON HCL 4 MG/2ML IJ SOLN
4.0000 mg | Freq: Three times a day (TID) | INTRAMUSCULAR | Status: DC | PRN
  Administered 2023-10-11: 4 mg via INTRAVENOUS
  Filled 2023-10-10: qty 2

## 2023-10-10 MED ORDER — IPRATROPIUM-ALBUTEROL 0.5-2.5 (3) MG/3ML IN SOLN
3.0000 mL | Freq: Four times a day (QID) | RESPIRATORY_TRACT | Status: DC
  Administered 2023-10-10 – 2023-10-12 (×7): 3 mL via RESPIRATORY_TRACT
  Filled 2023-10-10 (×8): qty 3

## 2023-10-10 MED ORDER — HYDROMORPHONE HCL 1 MG/ML IJ SOLN
0.5000 mg | Freq: Once | INTRAMUSCULAR | Status: AC
  Administered 2023-10-10: 0.5 mg via INTRAVENOUS
  Filled 2023-10-10: qty 0.5

## 2023-10-10 MED ORDER — SODIUM CHLORIDE 0.9 % IV SOLN
2.0000 g | INTRAVENOUS | Status: DC
  Administered 2023-10-11 – 2023-10-13 (×4): 2 g via INTRAVENOUS
  Filled 2023-10-10 (×4): qty 20

## 2023-10-10 MED ORDER — ATORVASTATIN CALCIUM 20 MG PO TABS
40.0000 mg | ORAL_TABLET | Freq: Every day | ORAL | Status: DC
  Administered 2023-10-11 – 2023-10-14 (×3): 40 mg via ORAL
  Filled 2023-10-10 (×3): qty 2

## 2023-10-10 MED ORDER — HYDROCODONE-ACETAMINOPHEN 5-325 MG PO TABS: 1.0000 | ORAL_TABLET | Freq: Four times a day (QID) | ORAL | Status: DC | PRN

## 2023-10-10 MED ORDER — ONDANSETRON HCL 4 MG/2ML IJ SOLN
4.0000 mg | Freq: Once | INTRAMUSCULAR | Status: AC
  Administered 2023-10-10: 4 mg via INTRAVENOUS
  Filled 2023-10-10: qty 2

## 2023-10-10 MED ORDER — ACETAMINOPHEN 325 MG PO TABS: 650.0000 mg | ORAL_TABLET | Freq: Four times a day (QID) | ORAL | Status: DC | PRN

## 2023-10-10 MED ORDER — FUROSEMIDE 10 MG/ML IJ SOLN
40.0000 mg | Freq: Two times a day (BID) | INTRAMUSCULAR | Status: DC
  Administered 2023-10-11: 40 mg via INTRAVENOUS
  Filled 2023-10-10: qty 4

## 2023-10-10 MED ORDER — ASPIRIN 81 MG PO TBEC
81.0000 mg | DELAYED_RELEASE_TABLET | Freq: Every day | ORAL | Status: DC
  Administered 2023-10-11 – 2023-10-14 (×3): 81 mg via ORAL
  Filled 2023-10-10 (×3): qty 1

## 2023-10-10 MED ORDER — OXYCODONE-ACETAMINOPHEN 5-325 MG PO TABS
1.0000 | ORAL_TABLET | ORAL | Status: DC | PRN
  Administered 2023-10-11 – 2023-10-14 (×6): 1 via ORAL
  Filled 2023-10-10 (×6): qty 1

## 2023-10-10 MED ORDER — DM-GUAIFENESIN ER 30-600 MG PO TB12
1.0000 | ORAL_TABLET | Freq: Two times a day (BID) | ORAL | Status: DC | PRN
  Administered 2023-10-11 – 2023-10-12 (×2): 1 via ORAL
  Filled 2023-10-10 (×3): qty 1

## 2023-10-10 MED ORDER — ALPRAZOLAM 0.5 MG PO TABS
0.5000 mg | ORAL_TABLET | Freq: Every evening | ORAL | Status: DC | PRN
  Administered 2023-10-11 – 2023-10-13 (×3): 0.5 mg via ORAL
  Filled 2023-10-10 (×3): qty 1

## 2023-10-10 MED ORDER — ALBUTEROL SULFATE (2.5 MG/3ML) 0.083% IN NEBU: 3.0000 mL | INHALATION_SOLUTION | RESPIRATORY_TRACT | Status: DC | PRN

## 2023-10-10 NOTE — ED Triage Notes (Signed)
Pt to ED via [facility] from home for chest pain and shortness of breath. Pt states that he has had chest pain for 2 days that radiates down his left arm. Pain has gotten progressively worse. Pt is also right flank pain and blood in his urine. Pt has known hx/o stone. Pt has hx/o 2 MI's with stents and pt states that this feels similar to the pain. Pt was given 3 81 mg Aspirin, 2 Albuterol Neb treatments, and 125 mg of solu-medrol by EMS. EMS reports wheezing bilaterally.

## 2023-10-10 NOTE — ED Provider Notes (Signed)
[facility] Provider Note    Event Date/Time   First MD Initiated Contact with Patient 10/10/23 [phone]     (approximate)   History   Chest Pain and Shortness of Breath   HPI  Ronnie Mann is a 67 y.o. male NSTEMI, s/p PCI to proximal LAD and RCA on 05/01/23, HFrEF(EF 30 to 35%), and nephrolithiasis presented to ED with shortness of breath, bilateral flank pain radiating towards lower abdomen, more on right than left, dysuria and gross hematuria.  Patient reports having chest pain and shortness of breath over the past 2 days.  He reports the chest pain is mild at this time he reports that it stays up in the front of his chest.  He denies a going into his back.  He also reports some right flank pain with some blood in his urine as well.  He was given full dose of aspirin, 2 albuterol nebs given they did reported some wheezing as well as 125 of Solu-Medrol.   Physical Exam   Triage Vital Signs: ED Triage Vitals  Encounter Vitals Group     BP 10/10/23 1639 (!) 146/98     Systolic BP Percentile --      Diastolic BP Percentile --      Pulse Rate 10/10/23 1639 (!) 101     Resp 10/10/23 1639 (!) 28     Temp 10/10/23 1638 97.8 F (36.6 C)     Temp Source 10/10/23 1638 Axillary     SpO2 10/10/23 1635 95 %     Weight --      Height --      Head Circumference --      Peak Flow --      Pain Score --      Pain Loc --      Pain Education --      Exclude from Growth Chart --     Most recent vital signs: Vitals:   10/10/23 1638 10/10/23 1639  BP:  (!) 146/98  Pulse:  (!) 101  Resp:  (!) 28  Temp: 97.8 F (36.6 C)   SpO2:  100%     General: Awake, no distress.  CV:  Good peripheral perfusion.  Resp:  Increased work of breathing no obvious wheezing Abd:  soft and nontender Other:  Edema noted bilaterally GU exam is normal.  ED Results / Procedures / Treatments   Labs (all labs ordered are listed, but only abnormal results are displayed) Labs  Reviewed  CBC WITH DIFFERENTIAL/PLATELET - Abnormal; Notable for the following components:      Result Value   RBC 3.59 (*)    Hemoglobin 10.4 (*)    HCT 32.6 (*)    RDW 18.6 (*)    All other components within normal limits  COMPREHENSIVE METABOLIC PANEL - Abnormal; Notable for the following components:   Glucose, Bld 145 (*)    BUN 37 (*)    Creatinine, Ser 1.33 (*)    Total Bilirubin 1.3 (*)    GFR, Estimated 59 (*)    All other components within normal limits  BRAIN NATRIURETIC PEPTIDE - Abnormal; Notable for the following components:   B Natriuretic Peptide 975.7 (*)    All other components within normal limits  TROPONIN I (HIGH SENSITIVITY) - Abnormal; Notable for the following components:   Troponin I (High Sensitivity) 33 (*)    All other components within normal limits  RESP PANEL BY RT-PCR (RSV, FLU A&B, COVID)  RVPGX2  MAGNESIUM  URINALYSIS, ROUTINE W REFLEX MICROSCOPIC  HEMOGLOBIN A1C  LIPID PANEL  PROTIME-INR  APTT  MAGNESIUM  URINE DRUG SCREEN, QUALITATIVE ([facility] ONLY)  BASIC METABOLIC PANEL     EKG  My interpretation of EKG:  Sinus tachy rate of 117, no st elevation, 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   PROCEDURES:  Critical Care performed: Yes, see critical care procedure note(s)  .1-3 Lead EKG Interpretation  Performed by: Concha Se, MD Authorized by: Concha Se, MD     Interpretation: abnormal     ECG rate:  110   ECG rate assessment: tachycardic     Rhythm: sinus tachycardia     Ectopy: none     Conduction: normal   .Critical Care  Performed by: Concha Se, MD Authorized by: Concha Se, MD   Critical care provider statement:    Critical care time (minutes):  30   Critical care was necessary to treat or prevent imminent or life-threatening deterioration of the following conditions:  Respiratory failure   Critical care was time spent personally by me on  the following activities:  Development of treatment plan with patient or surrogate, discussions with consultants, evaluation of patient's response to treatment, examination of patient, ordering and review of laboratory studies, ordering and review of radiographic studies, ordering and performing treatments and interventions, pulse oximetry, re-evaluation of patient's condition and review of old charts    MEDICATIONS ORDERED IN ED: Medications  furosemide (LASIX) injection 40 mg (has no administration in time range)  albuterol (PROVENTIL) (2.5 MG/3ML) 0.083% nebulizer solution 3 mL (has no administration in time range)  dextromethorphan-guaiFENesin (MUCINEX DM) 30-600 MG per 12 hr tablet 1 tablet (has no administration in time range)  ondansetron (ZOFRAN) injection 4 mg (has no administration in time range)  hydrALAZINE (APRESOLINE) injection 5 mg (has no administration in time range)  acetaminophen (TYLENOL) tablet 650 mg (has no administration in time range)  morphine (PF) 2 MG/ML injection 2 mg (has no administration in time range)  oxyCODONE-acetaminophen (PERCOCET/ROXICET) 5-325 MG per tablet 1 tablet (has no administration in time range)  ipratropium-albuterol (DUONEB) 0.5-2.5 (3) MG/3ML nebulizer solution 3 mL (has no administration in time range)  furosemide (LASIX) injection 40 mg (has no administration in time range)  HYDROmorphone (DILAUDID) injection 0.5 mg (0.5 mg Intravenous Given 10/10/23 1721)  ondansetron (ZOFRAN) injection 4 mg (4 mg Intravenous Given 10/10/23 1721)     IMPRESSION / MDM / ASSESSMENT AND PLAN / ED COURSE  I reviewed the triage vital signs and the nursing notes.   Patient's presentation is most consistent with acute presentation with potential threat to life or bodily function.   Patient comes in with concern for chest pain, shortness of breath he is extremely dyspneic on examination but his oxygen level looked okay.  Patient already received aspirin,  Solu-Medrol, albuterol and route.  I suspect this is related to CHF given he reports not a being able to lay flat discussed with swelling his legs.  Will give a dose of Lasix.  He reports some pain that comes and consistent with his prior kidney stones but he is unable to get CT scan due to on inability to lie flat at this time.  I tried to lower his bed just a few inches and he stated that he need to be put back up.  At this time he is afebrile with normal  white counts this is a chance of an infected kidney stone seems very low.  We discussed holding off on repeat imaging at this time given he has known kidney stones anyways he reports the hematuria has been intermittent.  I will discuss with the hospital team for admission given my concern for CHF.  Patient expressed understanding and felt comfortable with this plan.  Did give patient some IV Dilaudid and IV Zofran to help with symptoms.  Labs show elevated BNP.  Troponin slightly elevated we will continue to trend.  BMP shows kidney function around baseline  The patient is on the cardiac monitor to evaluate for evidence of arrhythmia and/or significant heart rate changes.      FINAL CLINICAL IMPRESSION(S) / ED DIAGNOSES   Final diagnoses:  Acute congestive heart failure, unspecified heart failure type (HCC)     Rx / DC Orders   ED Discharge Orders     None        Note:  This document was prepared using Dragon voice recognition software and may include unintentional dictation errors.   Concha Se, MD 10/10/23 [phone]

## 2023-10-10 NOTE — H&P (Signed)
History and Physical    JASPEN JARCHOW NWG:[id number] DOB: 22-May-1956 DOA: 10/10/2023  Referring MD/NP/PA:   PCP: Pcp, No   Patient coming from:  The patient is coming from home.     Chief Complaint: SOB, hematuria, flank pain  HPI: Ronnie Mann is a 67 y.o. male with medical history significant of staghorn calculus, left hydronephrosis, CHF with EF 30-35%, HTN, HLD, CAD with stent, CKD-3a, anemia, hiatal hernia, obesity, who presents with SOB, hematuria, flank pain.  Patient states that he has shortness of breath in the past 2 days, which has been progressively worsening.  He has cough with yellow-colored sputum production.  No fever or chills.  He also reports chest pain, which is located in substernal area, mild, pressure-like, radiating to the left arm, not aggravated or alleviated by any known factors.  He reports bilateral flank pain, hematuria, dysuria, burning with urination and increased urinary frequency.  No nausea vomiting or abdominal pain.  Per report, patient has wheezing earlier, which is resolved after giving 125 mg of Solu-Medrol.  When I saw patient in ED, patient does not have wheezing on my examination.   Data reviewed independently and ED Course: pt was found to have BNP 975.7, WBC 7.7, positive urinalysis (hazy appearance, large amount of leukocyte, rare bacteria, WBC> 50), stable renal function, temperature normal, blood pressure 106/72, heart rate of 109, RR 28, 17, oxygen saturation 94% on 3 L oxygen.  Chest x-ray showed interstitial pulmonary edema and hiatal hernia.  Patient is admitted to PCU as inpatient.  Dr. Lonna Cobb of urology is consulted.  CT-renal stone: 1. Moderate right-sided hydronephrosis due to an obstructing 14 mm mid right ureteral calculus. The 27 mm staghorn type calculus at the right UPJ may also contribute to the obstructive uropathy. Stranding of the renal sinus fat could reflect superimposed infection. 2. Stable chronic obstruction stable  chronic 13 mm obstructing mid left ureteral calculus, with persistent severe hydronephrosis and marked renal cortical thinning. 3. Other bilateral nonobstructing staghorn type renal calculi as above. 4. Cholelithiasis without cholecystitis. 5. Trace bilateral pleural effusions, with bilateral lower lobe atelectasis. 6. Large hiatal hernia. 7.  Aortic Atherosclerosis (ICD10-I70.0).   EKG: I have personally reviewed.  First EKG showed sinus rhythm, poor R wave progression, LAD, frequent PAC.  The second EKG showed sinus rhythm, QTc 482, LAD, poor R wave pression, PAC.   Review of Systems:   General: no fevers, chills, no body weight gain, has poor appetite, has fatigue HEENT: no blurry vision, hearing changes or sore throat Respiratory: has dyspnea, coughing CV: has chest pain, no palpitations GI: no nausea, vomiting, abdominal pain, diarrhea, constipation GU: has dysuria, burning on urination, increased urinary frequency, hematuria and flank pain Ext: has leg edema Neuro: no unilateral weakness, numbness, or tingling, no vision change or hearing loss Skin: no rash, no skin tear. MSK: No muscle spasm, no deformity, no limitation of range of movement in spin Heme: No easy bruising.  Travel history: No recent long distant travel.   Allergy:  Allergies  Allergen Reactions   Losartan Other (See Comments)    Chest pain    Past Medical History:  Diagnosis Date   Coronary artery disease 05/01/2023   PCI/DES   HFrEF (heart failure with reduced ejection fraction) (HCC) 05/01/2023   Hiatal hernia    Ischemic cardiomyopathy 05/01/2023   LVEF 30-35%   Kidney stones     Past Surgical History:  Procedure Laterality Date   CORONARY STENT INTERVENTION N/A 05/01/2023  Procedure: CORONARY STENT INTERVENTION;  Surgeon: Iran Ouch, MD;  Location: [facility] INVASIVE CV LAB;  Service: Cardiovascular;  Laterality: N/A;   LEFT HEART CATH AND CORONARY ANGIOGRAPHY N/A 05/01/2023    Procedure: LEFT HEART CATH AND CORONARY ANGIOGRAPHY;  Surgeon: Iran Ouch, MD;  Location: [facility] INVASIVE CV LAB;  Service: Cardiovascular;  Laterality: N/A;    Social History:  reports that he has never smoked. He has never been exposed to tobacco smoke. He has never used smokeless tobacco. He reports that he does not drink alcohol and does not use drugs.  Family History:  Family History  Problem Relation Age of Onset   Heart disease Father      Prior to Admission medications   Medication Sig Start Date End Date Taking? Authorizing Provider  ALPRAZolam Prudy Feeler) 0.5 MG tablet Take 1 tablet (0.5 mg total) by mouth at bedtime as needed for anxiety. Patient not taking: Reported on 08/30/2023 06/14/23   Sondra Come, MD  aspirin EC 81 MG tablet Take 81 mg by mouth daily. Swallow whole.    [provider]  atorvastatin (LIPITOR) 40 MG tablet Take 1 tablet (40 mg total) by mouth daily. 06/30/23 09/28/23  Delma Freeze, FNP  cefadroxil (DURICEF) 500 MG capsule Take 1 capsule (500 mg total) by mouth 2 (two) times daily. 08/30/23   Sabharwal, Aditya, DO  HYDROcodone-acetaminophen (NORCO/VICODIN) 5-325 MG tablet Take 1 tablet by mouth every 6 (six) hours as needed for severe pain. Patient not taking: Reported on 07/25/2023 06/14/23   Sondra Come, MD  metoprolol succinate (TOPROL-XL) 25 MG 24 hr tablet Take 1 tablet (25 mg total) by mouth daily. 06/30/23   Delma Freeze, FNP  polyethylene glycol (MIRALAX / GLYCOLAX) 17 g packet Take 17 g by mouth daily as needed for mild constipation. 05/27/23   Loyce Dys, MD  sacubitril-valsartan (ENTRESTO) 24-26 MG Take 1 tablet by mouth 2 (two) times daily. 07/25/23   Delma Freeze, FNP  spironolactone (ALDACTONE) 25 MG tablet Take 1 tablet (25 mg total) by mouth daily. 08/30/23 11/28/23  Sabharwal, Aditya, DO  tamsulosin (FLOMAX) 0.4 MG CAPS capsule Take 0.4 mg by mouth.    [provider]  ticagrelor (BRILINTA) 90 MG TABS tablet Take 90 mg  by mouth 2 (two) times daily.    [provider]    Physical Exam: Vitals:   10/10/23 2100 10/10/23 2225 10/10/23 2330 10/10/23 2349  BP:   120/69 120/69  Pulse: 92  87 87  Resp:    (!) 22  Temp:  98 F (36.7 C)  97.9 F (36.6 C)  TempSrc:  Axillary  Oral  SpO2: 97%  97% 96%  Weight:      Height:       General: Not in acute distress HEENT:       Eyes: PERRL, EOMI, no jaundice       ENT: No discharge from the ears and nose, no pharynx injection, no tonsillar enlargement.        Neck: positive JVD, no bruit, no mass felt. Heme: No neck lymph node enlargement. Cardiac: S1/S2, RRR, No murmurs, No gallops or rubs. Respiratory: has fine crackles bilaterally, no wheezing GI: Soft, nondistended, nontender, no rebound pain, no organomegaly, BS present. GU: Has positive CVA tenderness bilaterally, has hematuria Ext: 1+ pitting leg edema bilaterally. 1+DP/PT pulse bilaterally. Musculoskeletal: No joint deformities, No joint redness or warmth, no limitation of ROM in spin. Skin: No rashes.  Neuro: Alert, oriented  X3, cranial nerves II-XII grossly intact, moves all extremities normally. Psych: Patient is not psychotic, no suicidal or hemocidal ideation.  Labs on Admission: I have personally reviewed following labs and imaging studies  CBC: Recent Labs  Lab 10/10/23 1645  WBC 7.7  NEUTROABS 5.6  HGB 10.4*  HCT 32.6*  MCV 90.8  PLT 214   Basic Metabolic Panel: Recent Labs  Lab 10/10/23 1645  NA 135  K 4.1  CL 106  CO2 22  GLUCOSE 145*  BUN 37*  CREATININE 1.33*  CALCIUM 9.0  MG 2.0   GFR: Estimated Creatinine Clearance: 61.7 mL/min (A) (by C-G formula based on SCr of 1.33 mg/dL (H)). Liver Function Tests: Recent Labs  Lab 10/10/23 1645  AST 38  ALT 34  ALKPHOS 82  BILITOT 1.3*  PROT 7.9  ALBUMIN 4.4   No results for input(s): "LIPASE", "AMYLASE" in the last 168 hours. No results for input(s): "AMMONIA" in the last 168 hours. Coagulation  Profile: Recent Labs  Lab 10/10/23 2233  INR 1.1   Cardiac Enzymes: No results for input(s): "CKTOTAL", "CKMB", "CKMBINDEX", "TROPONINI" in the last 168 hours. BNP (last 3 results) No results for input(s): "PROBNP" in the last 8760 hours. HbA1C: No results for input(s): "HGBA1C" in the last 72 hours. CBG: No results for input(s): "GLUCAP" in the last 168 hours. Lipid Profile: No results for input(s): "CHOL", "HDL", "LDLCALC", "TRIG", "CHOLHDL", "LDLDIRECT" in the last 72 hours. Thyroid Function Tests: No results for input(s): "TSH", "T4TOTAL", "FREET4", "T3FREE", "THYROIDAB" in the last 72 hours. Anemia Panel: No results for input(s): "VITAMINB12", "FOLATE", "FERRITIN", "TIBC", "IRON", "RETICCTPCT" in the last 72 hours. Urine analysis:    Component Value Date/Time   COLORURINE YELLOW (A) 10/10/2023 2233   APPEARANCEUR HAZY (A) 10/10/2023 2233   LABSPEC 1.006 10/10/2023 2233   PHURINE 6.0 10/10/2023 2233   GLUCOSEU NEGATIVE 10/10/2023 2233   HGBUR LARGE (A) 10/10/2023 2233   BILIRUBINUR NEGATIVE 10/10/2023 2233   KETONESUR NEGATIVE 10/10/2023 2233   PROTEINUR 30 (A) 10/10/2023 2233   NITRITE NEGATIVE 10/10/2023 2233   LEUKOCYTESUR LARGE (A) 10/10/2023 2233   Sepsis Labs: @LABRCNTIP (procalcitonin:4,lacticidven:4) ) Recent Results (from the past 240 hour(s))  Resp panel by RT-PCR (RSV, Flu A&B, Covid) Anterior Nasal Swab     Status: None   Collection Time: 10/10/23 10:33 PM   Specimen: Anterior Nasal Swab  Result Value Ref Range Status   SARS Coronavirus 2 by RT PCR NEGATIVE NEGATIVE Final    Comment: (NOTE) SARS-CoV-2 target nucleic acids are NOT DETECTED.  The SARS-CoV-2 RNA is generally detectable in upper respiratory specimens during the acute phase of infection. The lowest concentration of SARS-CoV-2 viral copies this assay can detect is 138 copies/mL. A negative result does not preclude SARS-Cov-2 infection and should not be used as the sole basis for treatment  or other patient management decisions. A negative result may occur with  improper specimen collection/handling, submission of specimen other than nasopharyngeal swab, presence of viral mutation(s) within the areas targeted by this assay, and inadequate number of viral copies(<138 copies/mL). A negative result must be combined with clinical observations, patient history, and epidemiological information. The expected result is Negative.  Fact Sheet for Patients:  BloggerCourse.com  Fact Sheet for Healthcare Providers:  SeriousBroker.it  This test is no t yet approved or cleared by the [place] FDA and  has been authorized for detection and/or diagnosis of SARS-CoV-2 by FDA under an Emergency Use Authorization (EUA). This EUA will remain  in effect (  meaning this test can be used) for the duration of the COVID-19 declaration under Section 564(b)(1) of the Act, 21 U.S.C.section 360bbb-3(b)(1), unless the authorization is terminated  or revoked sooner.       Influenza A by PCR NEGATIVE NEGATIVE Final   Influenza B by PCR NEGATIVE NEGATIVE Final    Comment: (NOTE) The Xpert Xpress SARS-CoV-2/FLU/RSV plus assay is intended as an aid in the diagnosis of influenza from Nasopharyngeal swab specimens and should not be used as a sole basis for treatment. Nasal washings and aspirates are unacceptable for Xpert Xpress SARS-CoV-2/FLU/RSV testing.  Fact Sheet for Patients: BloggerCourse.com  Fact Sheet for Healthcare Providers: SeriousBroker.it  This test is not yet approved or cleared by the [place] FDA and has been authorized for detection and/or diagnosis of SARS-CoV-2 by FDA under an Emergency Use Authorization (EUA). This EUA will remain in effect (meaning this test can be used) for the duration of the COVID-19 declaration under Section 564(b)(1) of the Act, 21 U.S.C. section  360bbb-3(b)(1), unless the authorization is terminated or revoked.     Resp Syncytial Virus by PCR NEGATIVE NEGATIVE Final    Comment: (NOTE) Fact Sheet for Patients: BloggerCourse.com  Fact Sheet for Healthcare Providers: SeriousBroker.it  This test is not yet approved or cleared by the [place] FDA and has been authorized for detection and/or diagnosis of SARS-CoV-2 by FDA under an Emergency Use Authorization (EUA). This EUA will remain in effect (meaning this test can be used) for the duration of the COVID-19 declaration under Section 564(b)(1) of the Act, 21 U.S.C. section 360bbb-3(b)(1), unless the authorization is terminated or revoked.  Performed at [facility], [address] [place], [place] [address]      Radiological Exams on Admission: CT RENAL STONE STUDY  Result Date: 10/10/2023 CLINICAL DATA:  Bilateral flank pain right greater than left EXAM: CT ABDOMEN AND PELVIS WITHOUT CONTRAST TECHNIQUE: Multidetector CT imaging of the abdomen and pelvis was performed following the standard protocol without IV contrast. RADIATION DOSE REDUCTION: This exam was performed according to the departmental dose-optimization program which includes automated exposure control, adjustment of the mA and/or kV according to patient size and/or use of iterative reconstruction technique. COMPARISON:  05/21/2023 FINDINGS: Lower chest: Trace bilateral pleural effusions. Dependent areas of consolidation within the lungs consistent with atelectasis. Large hiatal hernia. Hepatobiliary: Unremarkable unenhanced appearance of the liver. Multiple large calcified gallstones are identified. No evidence of acute cholecystitis. Pancreas: Unremarkable. No pancreatic ductal dilatation or surrounding inflammatory changes. Spleen: Normal in size without focal abnormality. Adrenals/Urinary Tract: There is significant right-sided hydronephrosis and  hydroureter, due to an obstructing 14 mm mid right ureteral calculus reference image 62/2. There is a 27 mm staghorn type calculus at the right UPJ. Nonobstructing 44 mm staghorn calculus seen within the upper pole right kidney. There is stranding of the renal sinus fat surrounding the right renal pelvis, and superimposed infection cannot be excluded. Chronic obstructing proximal left ureteral calculus is identified, measuring 13 mm in maximal dimension. Mild chronic left hydronephrosis and hydroureter with severe left renal cortical thinning. Other nonobstructing left renal calculi are seen, with largest staghorn type calculus in the lower pole measuring 23 mm. Benign renal cortical cysts are again identified, do not require specific imaging follow-up. Stable bilateral adrenal adenomas. Bladder is unremarkable. Stomach/Bowel: No bowel obstruction or ileus. Normal appendix right lower quadrant. No bowel wall thickening or inflammatory change. Vascular/Lymphatic: Aortic atherosclerosis. No enlarged abdominal or pelvic lymph nodes. Reproductive: Prostate is unremarkable. Other:  No free fluid or free intraperitoneal gas. No abdominal wall hernia. Musculoskeletal: No acute or destructive bony abnormalities. Reconstructed images demonstrate no additional findings. IMPRESSION: 1. Moderate right-sided hydronephrosis due to an obstructing 14 mm mid right ureteral calculus. The 27 mm staghorn type calculus at the right UPJ may also contribute to the obstructive uropathy. Stranding of the renal sinus fat could reflect superimposed infection. 2. Stable chronic obstruction stable chronic 13 mm obstructing mid left ureteral calculus, with persistent severe hydronephrosis and marked renal cortical thinning. 3. Other bilateral nonobstructing staghorn type renal calculi as above. 4. Cholelithiasis without cholecystitis. 5. Trace bilateral pleural effusions, with bilateral lower lobe atelectasis. 6. Large hiatal hernia. 7.  Aortic  Atherosclerosis (ICD10-I70.0). Electronically Signed   By: Sharlet Salina M.D.   On: 10/10/2023 21:17   DG Chest Portable 1 View  Result Date: 10/10/2023 CLINICAL DATA:  Short of breath, chest pain for 2 days radiating down left arm EXAM: PORTABLE CHEST 1 VIEW COMPARISON:  05/16/2023 FINDINGS: 2 frontal views of the chest demonstrates stable enlargement of the cardiac silhouette. There is vascular congestion, with diffuse bilateral interstitial prominence consistent with developing edema. No effusion or pneumothorax. Stable hiatal hernia. IMPRESSION: 1. Findings consistent with congestive heart failure and developing interstitial edema. 2. Hiatal hernia. Electronically Signed   By: Sharlet Salina M.D.   On: 10/10/2023 21:09      Assessment/Plan Principal Problem:   Acute on chronic combined systolic and diastolic congestive heart failure (HCC) Active Problems:   Myocardial injury   CAD (coronary artery disease)   Essential hypertension   Chronic kidney disease, stage 3b (HCC)   Kidney stones   Hydronephrosis   Complicated UTI (urinary tract infection)   Obesity (BMI 30-39.9)   HLD (hyperlipidemia)   Assessment and Plan:  Acute on chronic combined systolic and diastolic congestive heart failure (HCC): 2D echo on 04/30/2023 showed EF 30-35% with grade 2 diastolic dysfunction.  Patient has elevated BNP 975, positive JVD, interstitial pulm edema by chest x-ray, clinically consistent with CHF exacerbation.  Patient has 3 L new oxygen requirement, but no acute respiratory distress.  -Will admit to PCU as inpatient -Lasix 40 mg bid by IV -Spironolactone -Hold Entresto due to softer blood pressure and newly started on IV Lasix -Daily weights -strict I/O's -Low salt diet -Fluid restriction -As needed bronchodilators for shortness of breath  Myocardial injury and hx of CAD (coronary artery disease): S/p of stent placement.  trop  33.  Likely due to demand ischemia secondary to CHF  exacerbation. -Trend troponin -Check A1c, FLP -As needed nitroglycerin and morphine -ASA and Lipitor -pt is not taking Brilinta.  Will not start reporting that today since patient may need procedure due to kidney stone  Essential hypertension -Metoprolol -Patient is on IV Lasix and spironolactone -IV hydralazine as needed  Chronic kidney disease, stage 3b (HCC): Renal function is close to baseline.  Recent baseline creatinine 1.0-1.3.  His creatinine is 1.33, BUN 37, GFR 59. -Follow-up with BMP  Kidney stones and hydronephrosis: -Consulted Dr. Lonna Cobb of urology  Complicated UTI (urinary tract infection):  -IV Rocephin -Follow-up urine culture  Obesity (BMI 30-39.9): Body weight 96.2 kg, BMI 31.31 -Encourage losing weight -Healthy diet and exercise  HLD (hyperlipidemia) -Lipitor       DVT ppx: SCD  Code Status: Full code     Family Communication: not done, no family member is at bed side.     Disposition Plan:  Anticipate discharge back to previous environment  Consults  called:  Dr. Lonna Cobb of urology  Admission status and Level of care: Progressive:   as inpt       Dispo: The patient is from: Home              Anticipated d/c is to: Home              Anticipated d/c date is: 2 days              Patient currently is not medically stable to d/c.    Severity of Illness:  The appropriate patient status for this patient is INPATIENT. Inpatient status is judged to be reasonable and necessary in order to provide the required intensity of service to ensure the patient's safety. The patient's presenting symptoms, physical exam findings, and initial radiographic and laboratory data in the context of their chronic comorbidities is felt to place them at high risk for further clinical deterioration. Furthermore, it is not anticipated that the patient will be medically stable for discharge from the hospital within 2 midnights of admission.   * I certify that at the point  of admission it is my clinical judgment that the patient will require inpatient hospital care spanning beyond 2 midnights from the point of admission due to high intensity of service, high risk for further deterioration and high frequency of surveillance required.*       Date of Service 10/10/2023    Lorretta Harp [facility]   If 7PM-7AM, please contact night-coverage [email or web address] 10/10/2023, 11:51 PM

## 2023-10-10 NOTE — ED Notes (Signed)
Xray tech at patient bedside.

## 2023-10-11 DIAGNOSIS — J209 Acute bronchitis, unspecified: Secondary | ICD-10-CM | POA: Diagnosis not present

## 2023-10-11 DIAGNOSIS — N200 Calculus of kidney: Secondary | ICD-10-CM | POA: Diagnosis not present

## 2023-10-11 DIAGNOSIS — I2510 Atherosclerotic heart disease of native coronary artery without angina pectoris: Secondary | ICD-10-CM | POA: Diagnosis not present

## 2023-10-11 DIAGNOSIS — R079 Chest pain, unspecified: Secondary | ICD-10-CM

## 2023-10-11 DIAGNOSIS — Z01810 Encounter for preprocedural cardiovascular examination: Secondary | ICD-10-CM | POA: Diagnosis not present

## 2023-10-11 DIAGNOSIS — N1339 Other hydronephrosis: Secondary | ICD-10-CM

## 2023-10-11 DIAGNOSIS — I5022 Chronic systolic (congestive) heart failure: Secondary | ICD-10-CM | POA: Diagnosis not present

## 2023-10-11 DIAGNOSIS — I428 Other cardiomyopathies: Secondary | ICD-10-CM

## 2023-10-11 DIAGNOSIS — N390 Urinary tract infection, site not specified: Secondary | ICD-10-CM

## 2023-10-11 LAB — TROPONIN I (HIGH SENSITIVITY)
Troponin I (High Sensitivity): 29 ng/L — ABNORMAL HIGH (ref <18)
Troponin I (High Sensitivity): 29 ng/L — ABNORMAL HIGH (ref <18)

## 2023-10-11 LAB — BASIC METABOLIC PANEL
Anion gap: 8 (ref 5–15)
BUN: 39 mg/dL — ABNORMAL HIGH (ref 8–23)
CO2: 25 mmol/L (ref 22–32)
Calcium: 9.2 mg/dL (ref 8.9–10.3)
Chloride: 107 mmol/L (ref 98–111)
Creatinine, Ser: 1.53 mg/dL — ABNORMAL HIGH (ref 0.61–1.24)
GFR, Estimated: 50 mL/min — ABNORMAL LOW (ref >60)
Glucose, Bld: 142 mg/dL — ABNORMAL HIGH (ref 70–99)
Potassium: 4.7 mmol/L (ref 3.5–5.1)
Sodium: 140 mmol/L (ref 135–145)

## 2023-10-11 LAB — HEMOGLOBIN A1C
Hgb A1c MFr Bld: 6.1 % — ABNORMAL HIGH (ref 4.8–5.6)
Mean Plasma Glucose: 128.37 mg/dL

## 2023-10-11 LAB — LIPID PANEL
Cholesterol: 166 mg/dL (ref 0–200)
HDL: 30 mg/dL — ABNORMAL LOW (ref >40)
LDL Cholesterol: 122 mg/dL — ABNORMAL HIGH (ref 0–99)
Total CHOL/HDL Ratio: 5.5 {ratio}
Triglycerides: 72 mg/dL (ref <150)
VLDL: 14 mg/dL (ref 0–40)

## 2023-10-11 LAB — CBG MONITORING, ED: Glucose-Capillary: 139 mg/dL — ABNORMAL HIGH (ref 70–99)

## 2023-10-11 LAB — MAGNESIUM: Magnesium: 2 mg/dL (ref 1.7–2.4)

## 2023-10-11 MED ORDER — BUDESONIDE 0.25 MG/2ML IN SUSP
0.2500 mg | Freq: Two times a day (BID) | RESPIRATORY_TRACT | Status: DC
  Administered 2023-10-11 – 2023-10-14 (×5): 0.25 mg via RESPIRATORY_TRACT
  Filled 2023-10-11 (×7): qty 2

## 2023-10-11 MED ORDER — METHYLPREDNISOLONE SODIUM SUCC 40 MG IJ SOLR
40.0000 mg | Freq: Every day | INTRAMUSCULAR | Status: DC
  Administered 2023-10-11 – 2023-10-14 (×4): 40 mg via INTRAVENOUS
  Filled 2023-10-11 (×4): qty 1

## 2023-10-11 NOTE — Assessment & Plan Note (Addendum)
Creatinine improved to 1.33.
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
## 2023-10-11 NOTE — Assessment & Plan Note (Addendum)
Initial urine culture negative, urine culture from cystoscopy negative for 12 hours.  Continue Rocephin for right now but likely will discontinue soon.

## 2023-10-11 NOTE — ED Notes (Signed)
Pt called out using call light. This tech went in pt rm. Pt started he was having leg cramps and anxiety attacks. This tech noticed that pt had his nasal cannula off. This tech placed nasal cannula back on pt. Nonnie Done, RN informed about pt complaints.

## 2023-10-11 NOTE — Progress Notes (Signed)
Progress Note   Patient: Ronnie Mann WUJ:[id number] DOB: 04-Oct-1956 DOA: 10/10/2023     1 DOS: the patient was seen and examined on 10/11/2023   Brief hospital course: 67 year old man past medical history of staghorn calculus and left hydronephrosis, CHF with EF of 30 to 35%, hypertension, hyperlipidemia, CAD, CKD stage IIIa, anemia, hiatal hernia, obesity presented with shortness of breath hematuria and flank pain.  Patient was started on IV Lasix for CHF exacerbation.  Patient complains he has a cold.  Patient also urinating some blood.  11/20.  Cardiology believes this is likely more bronchitis rather than CHF.  Will hold Lasix after this afternoon's dose.  I will send off a viral respiratory panel.  Give a dose of Solu-Medrol.  Assessment and Plan: * Acute bronchitis Will give nebulizer treatments and a dose of Solu-Medrol.  Send off a viral respiratory panel.  COVID test and influenza negative.  Chronic systolic CHF (congestive heart failure) [facility]) Cardiology believes this is more like acute bronchitis rather than acute on chronic systolic congestive heart failure.  Lasix will be held after today's dose.  Continue spironolactone and Toprol  Right nephrolithiasis Obstructing stone and right hydronephrosis.  Urology will place a stent tomorrow.  Urology will not place a stent on the left side secondary to chronic findings of staghorn calculus and left-sided chronic hydronephrosis.  CAD (coronary artery disease) Patient on aspirin and Toprol  Myocardial injury Troponin borderline at 29.  This is not a myocardial infarction.  Essential hypertension Continue spironolactone and Toprol  Chronic kidney disease, stage 3b (HCC) Creatinine creeped up a little bit to 1.53 with a GFR 50.  Continue to monitor  Complicated UTI (urinary tract infection) Continue Rocephin and follow-up urine culture  Obesity (BMI 30-39.9) BMI 31.31 with current height and weight in computer  HLD  (hyperlipidemia) On Lipitor        Subjective: Patient feels short of breath with some cough.  He can hardly lie flat last couple nights.  Cardiology felt this was more of a bronchitis rather than heart failure.  Patient came in also with flank pain, kidney stone and hematuria.  Physical Exam: Vitals:   10/11/23 1300 10/11/23 1411 10/11/23 1417 10/11/23 1734  BP: 118/76 94/65 104/77 (!) 109/93  Pulse: 79 77  88  Resp: (!) 45 18 20 16   Temp:  97.7 F (36.5 C)  97.8 F (36.6 C)  TempSrc:  Oral    SpO2: 100% 100%  99%  Weight:      Height:       Physical Exam HENT:     Head: Normocephalic.     Mouth/Throat:     Pharynx: No oropharyngeal exudate.  Eyes:     General: Lids are normal.     Conjunctiva/sclera: Conjunctivae normal.  Cardiovascular:     Rate and Rhythm: Normal rate and regular rhythm.     Heart sounds: Normal heart sounds, S1 normal and S2 normal.  Pulmonary:     Breath sounds: Examination of the right-lower field reveals decreased breath sounds and rhonchi. Examination of the left-lower field reveals decreased breath sounds and rhonchi. Decreased breath sounds and rhonchi present. No wheezing or rales.  Abdominal:     Palpations: Abdomen is soft.     Tenderness: There is abdominal tenderness in the right lower quadrant.  Musculoskeletal:     Right lower leg: Swelling present.     Left lower leg: Swelling present.  Skin:    General: Skin is warm.  Findings: No rash.  Neurological:     Mental Status: He is alert and oriented to person, place, and time.     Data Reviewed: Creatinine 1.53, GFR 50, troponin 29, LDL 122, hemoglobin 10.4  Family Communication: updated son on the phone  Disposition: Status is: Inpatient Remains inpatient appropriate because: For ureteral stent tomorrow  Planned Discharge Destination: Home    Time spent: 28 minutes Case discussed with cardiology and urology  Author: Alford Highland, MD 10/11/2023 5:55 PM  For on  call review [email or web address].

## 2023-10-11 NOTE — Assessment & Plan Note (Addendum)
Continue spironolactone and Toprol.  Can go back on Entresto as outpatient.

## 2023-10-11 NOTE — Assessment & Plan Note (Addendum)
Cardiology believes this is more like acute bronchitis rather than acute on chronic systolic congestive heart failure.  Lasix will be held this a.m.  Continue spironolactone and Toprol.  EF 25 to 30% with moderate mitral regurgitation.

## 2023-10-11 NOTE — Hospital Course (Addendum)
67 year old man past medical history of staghorn calculus and left hydronephrosis, CHF with EF of 30 to 35%, hypertension, hyperlipidemia, CAD, CKD stage IIIa, anemia, hiatal hernia, obesity presented with shortness of breath hematuria and flank pain.  Patient was started on IV Lasix for CHF exacerbation.  Patient complains he has a cold.  Patient also urinating some blood.  11/20.  Cardiology believes this is likely more bronchitis rather than CHF.  Will hold Lasix after this afternoon's dose.  I will send off a viral respiratory panel.  Give a dose of Solu-Medrol. 11/21.  Viral respiratory panel negative.  Patient breathing little bit better this morning.  Stent placement by urology today. 11/22.  Patient was very upset about the insurance company not being able to help him with setting up a primary care physician, it was hard to focus him on anything else today. 11/23.  Okay to go back on [facility].  Patient feeling better and wants to go home.  Breathing better.  Able to come off oxygen today.

## 2023-10-11 NOTE — Progress Notes (Signed)
I saw this patient this afternoon to answer his additional questions regarding the ureteral stent placement that we have planned.  We are still waiting cardiac clearance at this time.  He is made n.p.o. after midnight in case the clearance comes this evening.   He wanted to know how long the stent would be in place, and I explained to him that would be a decision to make after the stent was placed, but likely plan on 1 to 2 weeks.  He also wanted me to know that alprazolam 0.5 mg at bedtime seems to help him tolerate his stents in the past more effectively.

## 2023-10-11 NOTE — Assessment & Plan Note (Addendum)
Patient was given nebulizer treatments and Solu-Medrol here.  Will give a prednisone taper and albuterol inhaler to go home with.  Viral respiratory panel negative COVID test and influenza negative.

## 2023-10-11 NOTE — Assessment & Plan Note (Addendum)
Obstructing stone and right hydronephrosis.  Urology placed a right ureteral stent on 11/21.  Urology will not place a stent on the left side secondary to chronic left-sided chronic hydronephrosis.  They recommend the stent stay in until seen on January 3 by urology at [facility].

## 2023-10-11 NOTE — Assessment & Plan Note (Signed)
Troponin borderline at 29.  This is not a myocardial infarction.
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
## 2023-10-11 NOTE — Consult Note (Signed)
Cardiology Consultation   Patient ID: KERN AHO MRN: [id number]; DOB: December 19, 1955  Admit date: 10/10/2023 Date of Consult: 10/11/2023  PCP:  Oneita Hurt No   [facility] Providers Cardiologist:  Julien Nordmann, MD        Patient Profile:   Ronnie Mann is a 67 y.o. male with a hx of kidney stones, coronary disease status post recent NSTEMI with PCI to the p LAD and PCI/DES to the RCA (05/01/2023), HFrEF (30-35%), ischemic cardiomyopathy, hiatal hernia, nephrolithiasis, who is being seen 10/11/2023 for the evaluation of preoperative cardiovascular evaluation at the request of Dr. Renae Gloss.  History of Present Illness:   Ronnie Mann has a significant history of nephrolithiasis requiring multiple surgeries lithotripsies in the past.  He was previously evaluated at [facility] emergency department 04/28/2023 reporting right kidney pain that been ongoing related to exertion.  He reported upper respiratory infection with congestion and runny nose for a week prior to his admission.  He also had new onset substernal vice like chest discomfort that radiated into the left arm.  He reported ongoing shortness of breath associated with this that awoken him from sleep in a cold sweat.  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  EKG showed sinus tach with right bundle branch block, bifascicular block, and no acute ST-T wave changes.  Echocardiogram revealed an LVEF of 30-35%, he did develop flash pulmonary edema during his left heart catheterization on 05/01/2023 was treated with nitro drip.  Diuretics were held in the setting of a deteriorating kidney function.  He was noted during his last hospitalization to have an AKI in the setting of aggressive diuretics.  He underwent left heart catheterization and had a successful balloon angioplasty to the proximal LAD with DES stent placement to the proximal right coronary artery.  He was  continued on aspirin, high intensity statin, Brilinta.  We were unable to start beta-blocker therapy at that time due to hypotension.  He was also evaluated by urology during hospitalization but refused to repeat the stent placement and will need to be seen outpatient and was empirically treated with Rocephin.  He was noted to have community-acquired pneumonia and finished IV Rocephin and Zithromax.  Was considered stable for discharge and was discharged from the facility on 05/06/2023.  He presented to the [facility] emergency department again on 05/16/2023 with complaints of shortness of breath and a left kidney stone pain.  Stated been having several spells or episodes of chest pressure and pain similar to how he felt prior to his heart attack.  He required Foley catheter placement with improvement in his hematuria Foley catheter was removed.  Residual urine check was was satisfactory.  No retention was noted he was able to void without issues.  At that time his Brilinta was switched to clopidogrel and aspirin was discontinued in setting of hematuria.  He was found to have increased serum creatinine was seen by nephrology and cleared for discharge by urology and nephrology with follow-up as an outpatient.  He was considered stable for discharge on 05/27/2023.  As an outpatient he continued to follow with advanced heart failure clinic for his HFrEF with prior hospitalization revealing an LVEF of 30-35%.  He can presented to the [facility] emergency department on 10/10/2023 via EMS with complaints of chest pain and shortness of breath.  Stated chest pain for 2 days which radiated down his left arm.  Stated the pain progressively worsened.  He also had right flank pain and hematuria.  Patient was given 381 mg aspirin, 2 albuterol nebulizer treatments and 125 mg of Solu-Medrol by EMS due to EMS reporting bilateral wheezing.  He stated that he had been upper respiratory infection for the last several weeks and had been on  antibiotics and an over-the-counter medication.  Continued to have a productive cough.  He also noted that he had worsening shortness of breath upon lying flat and suffered from PND.  Noted chest discomfort likely related to his cough that was reproducible.  He noticed some occasional radiation to his left arm but nothing as significant as prior episodes.  He continued to have some right flank pain.  Stating that his last dose of Brilinta was yesterday morning and he has been continued on aspirin therapy.  Initial vital signs: Blood pressure 146/98, pulse of 101, respirations of 20, temperature 97.8  Pertinent labs: Hemoglobin 10.4, hematocrit of 32.6, blood glucose 145, BUN 37, serum creatinine 1.33, total bilirubin 1.3, GFR 59, BNP 975.7, high-sensitivity troponin of 33, urine drug screen was positive for cannabinoid, respiratory panel was negative for COVID, flu A/B, and RSV by PCR  Imaging: Chest x-ray revealed findings consistent with congestive heart failure developing interstitial edema, hiatal hernia; CT renal stone study revealed moderate right-sided hydronephrosis due to an obstructing 14 mm mid right ureteral calculus, 27 mm staghorn type calculus in the right UPJ, stable chronic obstruction with mid left urethral calculus measuring 13 mm, other bilateral nonobstructing staghorn type renal calculi, cholelithiasis without cholecystitis, trace bilateral pleural effusions with bilateral lower lobe atelectasis, large hiatal hernia, and aortic atherosclerosis  Medications administered in the emergency department: Zofran 4 mg IVP, Dilaudid 0.5 mg IVP, furosemide 40 mg IVP, DuoNeb nebulizer  Cardiology was consulted for preoperative cardiovascular examination prior to stent placement for obstructing renal calculi with history of CAD and HFrEF  Past Medical History:  Diagnosis Date   Coronary artery disease 05/01/2023   PCI/DES   HFrEF (heart failure with reduced ejection fraction) (HCC)  05/01/2023   Hiatal hernia    Ischemic cardiomyopathy 05/01/2023   LVEF 30-35%   Kidney stones     Past Surgical History:  Procedure Laterality Date   CORONARY STENT INTERVENTION N/A 05/01/2023   Procedure: CORONARY STENT INTERVENTION;  Surgeon: Iran Ouch, MD;  Location: [facility] INVASIVE CV LAB;  Service: Cardiovascular;  Laterality: N/A;   LEFT HEART CATH AND CORONARY ANGIOGRAPHY N/A 05/01/2023   Procedure: LEFT HEART CATH AND CORONARY ANGIOGRAPHY;  Surgeon: Iran Ouch, MD;  Location: [facility] INVASIVE CV LAB;  Service: Cardiovascular;  Laterality: N/A;     Home Medications:  Prior to Admission medications   Medication Sig Start Date End Date Taking? Authorizing Provider  aspirin EC 81 MG tablet Take 81 mg by mouth daily. Swallow whole.   Yes [provider]  cefadroxil (DURICEF) 500 MG capsule Take 1 capsule (500 mg total) by mouth 2 (two) times daily. 08/30/23  Yes Sabharwal, Aditya, DO  metoprolol succinate (TOPROL-XL) 25 MG 24 hr tablet Take 1 tablet (25 mg total) by mouth daily. 06/30/23  Yes Hackney, Inetta Fermo A, FNP  polyethylene glycol (MIRALAX / GLYCOLAX) 17 g packet Take 17 g by mouth daily as needed for mild constipation. 05/27/23  Yes Djan, Scarlette Calico, MD  sacubitril-valsartan (ENTRESTO) 24-26 MG Take 1 tablet by mouth 2 (two) times daily. 07/25/23  Yes Clarisa Kindred A, FNP  spironolactone (ALDACTONE) 25 MG tablet Take 1 tablet (25 mg total) by mouth  daily. 08/30/23 11/28/23 Yes Sabharwal, Aditya, DO  ALPRAZolam (XANAX) 0.5 MG tablet Take 1 tablet (0.5 mg total) by mouth at bedtime as needed for anxiety. Patient not taking: Reported on 08/30/2023 06/14/23   Sondra Come, MD  atorvastatin (LIPITOR) 40 MG tablet Take 1 tablet (40 mg total) by mouth daily. 06/30/23 09/28/23  Delma Freeze, FNP  HYDROcodone-acetaminophen (NORCO/VICODIN) 5-325 MG tablet Take 1 tablet by mouth every 6 (six) hours as needed for severe pain. Patient not taking: Reported on 07/25/2023 06/14/23   Sondra Come, MD  tamsulosin (FLOMAX) 0.4 MG CAPS capsule Take 0.4 mg by mouth. Patient not taking: Reported on 10/10/2023    [provider]  ticagrelor (BRILINTA) 90 MG TABS tablet Take 90 mg by mouth 2 (two) times daily. Patient not taking: Reported on 10/10/2023    [provider]    Inpatient Medications: Scheduled Meds:  aspirin EC  81 mg Oral Daily   atorvastatin  40 mg Oral Daily   furosemide  40 mg Intravenous Q12H   ipratropium-albuterol  3 mL Nebulization Q6H   metoprolol succinate  25 mg Oral Daily   spironolactone  25 mg Oral Daily   Continuous Infusions:  cefTRIAXone (ROCEPHIN)  IV Stopped (10/11/23 0059)   PRN Meds: acetaminophen, albuterol, ALPRAZolam, dextromethorphan-guaiFENesin, hydrALAZINE, morphine injection, nitroGLYCERIN, ondansetron (ZOFRAN) IV, oxyCODONE-acetaminophen  Allergies:    Allergies  Allergen Reactions   Losartan Other (See Comments)    Chest pain    Social History:   Social History   Socioeconomic History   Marital status: Divorced    Spouse name: Not on file   Number of children: Not on file   Years of education: Not on file   Highest education level: Not on file  Occupational History   Not on file  Tobacco Use   Smoking status: Never    Passive exposure: Never   Smokeless tobacco: Never  Substance and Sexual Activity   Alcohol use: Never   Drug use: Never   Sexual activity: Not on file  Other Topics Concern   Not on file  Social History Narrative   Not on file   Social Determinants of Health   Financial Resource Strain: Not on file  Food Insecurity: No Food Insecurity (05/16/2023)   Hunger Vital Sign    Worried About Running Out of Food in the Last Year: Never true    Ran Out of Food in the Last Year: Never true  Transportation Needs: No Transportation Needs (05/16/2023)   PRAPARE - Administrator, Civil Service (Medical): No    Lack of Transportation (Non-Medical): No  Physical Activity: Not  on file  Stress: Not on file  Social Connections: Not on file  Intimate Partner Violence: Not At Risk (05/16/2023)   Humiliation, Afraid, Rape, and Kick questionnaire    Fear of Current or Ex-Partner: No    Emotionally Abused: No    Physically Abused: No    Sexually Abused: No    Family History:    Family History  Problem Relation Age of Onset   Heart disease Father      ROS:  Please see the history of present illness.  Review of Systems  Constitutional:  Positive for malaise/fatigue.  Respiratory:  Positive for cough, sputum production, shortness of breath and wheezing.   Cardiovascular:  Positive for chest pain.  Gastrointestinal:  Positive for abdominal pain.  Genitourinary:  Positive for flank pain and hematuria.  Neurological:  Positive for weakness.  All other ROS reviewed and negative.     Physical Exam/Data:   Vitals:   10/11/23 0900 10/11/23 0930 10/11/23 1000 10/11/23 1030  BP: (!) 138/119 135/77 116/71 129/75  Pulse: 98 72 (!) 105 (!) 102  Resp: 18 (!) 38 (!) 23 (!) 25  Temp:      TempSrc:      SpO2: 100% 99% 100% 100%  Weight:      Height:        Intake/Output Summary (Last 24 hours) at 10/11/2023 1215 Last data filed at 10/11/2023 0746 Gross per 24 hour  Intake 100 ml  Output 1450 ml  Net -1350 ml      10/10/2023    4:40 PM 08/30/2023    1:58 PM 06/30/2023    9:03 AM  Last 3 Weights  Weight (lbs) 212 lb 201 lb 12.8 oz 201 lb  Weight (kg) 96.163 kg 91.536 kg 91.173 kg     Body mass index is 31.31 kg/m.  General:  Well nourished, well developed, in no acute distress HEENT: normal Neck: + JVD Vascular: No carotid bruits; Distal pulses 2+ bilaterally Cardiac:  normal S1, S2; RRR; no murmur  Lungs:  clear upper lobe with bibasilar crackles in the bases to auscultation bilaterally, respirations are unlabored at rest on 2 L of O2 via nasal cannula Abd: soft, + tender, no hepatomegaly  Ext: no edema Musculoskeletal:  No deformities, BUE and BLE  strength normal and equal Skin: warm and dry  Neuro:  CNs 2-12 intact, no focal abnormalities noted Psych:  Normal affect   EKG:  The EKG was personally reviewed and demonstrates: Sinus tachycardia, LVH, right bundle branch block, left anterior fascicular block, anterior Q waves Telemetry:  Telemetry was personally reviewed and demonstrates: Sinus rhythm rates of 70-80  Relevant CV Studies: LHC 05/01/23   Prox LAD to Mid LAD lesion is 99% stenosed.   Mid LAD lesion is 60% stenosed.   1st Diag lesion is 70% stenosed.   Prox RCA lesion is 95% stenosed.   Mid RCA lesion is 30% stenosed.   RPDA lesion is 60% stenosed.   Dist LAD lesion is 99% stenosed.   A drug-eluting stent was successfully placed using a STENT ONYX FRONTIER [id number].   Balloon angioplasty was performed using a BALLN [place] EUPHORA RX [id number].   Post intervention, there is a 20% residual stenosis.   Post intervention, there is a 0% residual stenosis.   There is severe left ventricular systolic dysfunction.   LV end diastolic pressure is severely elevated.   There is moderate (3+) mitral regurgitation.   1.  Severe two-vessel coronary artery disease with subtotal occlusion of the proximal LAD, diffuse moderate mid LAD disease and severe distal LAD disease.  In addition, there is 95% in the proximal right coronary artery with faint right to left collaterals to the LAD.  The left circumflex has mild nonobstructive disease and OM 3 is large and reaches the apex. 2.  Severely reduced LV systolic function with an EF of 25%.  Severely elevated left ventricular end-diastolic pressure at 34 mmHg. 3.  Successful balloon angioplasty to the proximal LAD and drug-eluting stent placement to the proximal right coronary artery.   TTE 04/30/23 1. Left ventricular ejection fraction, by estimation, is 30 to 35%. Left  ventricular ejection fraction by PLAX is 32 %. The left ventricle has  moderately decreased function. The left ventricle  demonstrates global  hypokinesis with severe hypokinesis of  the anterior, anteroseptal and apical  region. The left ventricular  internal cavity size was mildly dilated. There is mild left ventricular  hypertrophy. Left ventricular diastolic parameters are consistent with  Grade II diastolic dysfunction  (pseudonormalization).   2. Right ventricular systolic function is normal. The right ventricular  size is normal. There is normal pulmonary artery systolic pressure.   3. The mitral valve is normal in structure. Moderate mitral valve  regurgitation. No evidence of mitral stenosis.   4. The aortic valve is normal in structure. Aortic valve regurgitation is  not visualized. No aortic stenosis is present.   5. There is mild dilatation of the ascending aorta, measuring 42 mm.   6. The inferior vena cava is normal in size with <50% respiratory  variability, suggesting right atrial pressure of 8 mmHg.   Laboratory Data:  High Sensitivity Troponin:   Recent Labs  Lab 10/10/23 1645 10/10/23 2233 10/10/23 2346 10/11/23 0610  TROPONINIHS 33* 29* 29* 29*     Chemistry Recent Labs  Lab 10/10/23 1645 10/11/23 0610  NA 135 140  K 4.1 4.7  CL 106 107  CO2 22 25  GLUCOSE 145* 142*  BUN 37* 39*  CREATININE 1.33* 1.53*  CALCIUM 9.0 9.2  MG 2.0 2.0  GFRNONAA 59* 50*  ANIONGAP 7 8    Recent Labs  Lab 10/10/23 1645  PROT 7.9  ALBUMIN 4.4  AST 38  ALT 34  ALKPHOS 82  BILITOT 1.3*   Lipids  Recent Labs  Lab 10/11/23 0610  CHOL 166  TRIG 72  HDL 30*  LDLCALC 122*  CHOLHDL 5.5    Hematology Recent Labs  Lab 10/10/23 1645  WBC 7.7  RBC 3.59*  HGB 10.4*  HCT 32.6*  MCV 90.8  MCH 29.0  MCHC 31.9  RDW 18.6*  PLT 214   Thyroid No results for input(s): "TSH", "FREET4" in the last 168 hours.  BNP Recent Labs  Lab 10/10/23 1645  BNP 975.7*    DDimer No results for input(s): "DDIMER" in the last 168 hours.   Radiology/Studies:  CT RENAL STONE STUDY  Result  Date: 10/10/2023 CLINICAL DATA:  Bilateral flank pain right greater than left EXAM: CT ABDOMEN AND PELVIS WITHOUT CONTRAST TECHNIQUE: Multidetector CT imaging of the abdomen and pelvis was performed following the standard protocol without IV contrast. RADIATION DOSE REDUCTION: This exam was performed according to the departmental dose-optimization program which includes automated exposure control, adjustment of the mA and/or kV according to patient size and/or use of iterative reconstruction technique. COMPARISON:  05/21/2023 FINDINGS: Lower chest: Trace bilateral pleural effusions. Dependent areas of consolidation within the lungs consistent with atelectasis. Large hiatal hernia. Hepatobiliary: Unremarkable unenhanced appearance of the liver. Multiple large calcified gallstones are identified. No evidence of acute cholecystitis. Pancreas: Unremarkable. No pancreatic ductal dilatation or surrounding inflammatory changes. Spleen: Normal in size without focal abnormality. Adrenals/Urinary Tract: There is significant right-sided hydronephrosis and hydroureter, due to an obstructing 14 mm mid right ureteral calculus reference image 62/2. There is a 27 mm staghorn type calculus at the right UPJ. Nonobstructing 44 mm staghorn calculus seen within the upper pole right kidney. There is stranding of the renal sinus fat surrounding the right renal pelvis, and superimposed infection cannot be excluded. Chronic obstructing proximal left ureteral calculus is identified, measuring 13 mm in maximal dimension. Mild chronic left hydronephrosis and hydroureter with severe left renal cortical thinning. Other nonobstructing left renal calculi are seen, with largest staghorn type calculus in the lower pole measuring 23 mm. Benign renal cortical  cysts are again identified, do not require specific imaging follow-up. Stable bilateral adrenal adenomas. Bladder is unremarkable. Stomach/Bowel: No bowel obstruction or ileus. Normal appendix  right lower quadrant. No bowel wall thickening or inflammatory change. Vascular/Lymphatic: Aortic atherosclerosis. No enlarged abdominal or pelvic lymph nodes. Reproductive: Prostate is unremarkable. Other: No free fluid or free intraperitoneal gas. No abdominal wall hernia. Musculoskeletal: No acute or destructive bony abnormalities. Reconstructed images demonstrate no additional findings. IMPRESSION: 1. Moderate right-sided hydronephrosis due to an obstructing 14 mm mid right ureteral calculus. The 27 mm staghorn type calculus at the right UPJ may also contribute to the obstructive uropathy. Stranding of the renal sinus fat could reflect superimposed infection. 2. Stable chronic obstruction stable chronic 13 mm obstructing mid left ureteral calculus, with persistent severe hydronephrosis and marked renal cortical thinning. 3. Other bilateral nonobstructing staghorn type renal calculi as above. 4. Cholelithiasis without cholecystitis. 5. Trace bilateral pleural effusions, with bilateral lower lobe atelectasis. 6. Large hiatal hernia. 7.  Aortic Atherosclerosis (ICD10-I70.0). Electronically Signed   By: Sharlet Salina M.D.   On: 10/10/2023 21:17   DG Chest Portable 1 View  Result Date: 10/10/2023 CLINICAL DATA:  Short of breath, chest pain for 2 days radiating down left arm EXAM: PORTABLE CHEST 1 VIEW COMPARISON:  05/16/2023 FINDINGS: 2 frontal views of the chest demonstrates stable enlargement of the cardiac silhouette. There is vascular congestion, with diffuse bilateral interstitial prominence consistent with developing edema. No effusion or pneumothorax. Stable hiatal hernia. IMPRESSION: 1. Findings consistent with congestive heart failure and developing interstitial edema. 2. Hiatal hernia. Electronically Signed   By: Sharlet Salina M.D.   On: 10/10/2023 21:09     Assessment and Plan:   Acute on chronic HFrEF -Echocardiogram completed 04/2023 revealed an LVEF of 30 to 35%, G2 DD -Presented with  worsening shortness of breath, PND, and muscle cramps -BNP elevated at 975 -Chest x-ray revealed pulmonary edema clinically consistent with CHF -Currently requiring oxygen therapy to maintain oxygen saturations greater than equal to 92% -Continued on furosemide 40 mg IV twice daily -Daily BMP while on IV diuretic therapy monitoring kidney function -Entresto remains on hold due to softer blood pressure with starting IV Lasix -Continued on spironolactone and Toprol XL -Limited echocardiogram ordered and pending with further recommendations to follow -His continue to follow with advanced heart failure as outpatient -Daily weights, I's and O's, low-sodium diet  Elevated high-sensitivity troponin with a history of coronary artery disease -Presented with complaints of chest pain that is reproducible on exam -Patient has had a cough and upper respiratory infection for greater than 2 weeks pain is exacerbated by cough -High-sensitivity troponin trended 33, 29, 29, 29 -Slight elevation is likely secondary to heart failure exacerbation, upper respiratory infection/bronchitis -EKG with no ischemic changes -Previously been on dual antiplatelet therapy, stating his last dose of Brilinta was yesterday morning -Continue on aspirin and statin therapy, per urology this does not need to be stopped prior to procedure, he can have Brilinta post stent placement -Continue with telemetry monitoring -EKG as needed for pain or changes  Kidney stones with hydronephrosis -Tentatively scheduled for stent placement tomorrow -Continues to be followed by urology and IM  CKD stage IIIb with UTI -Serum creatinine 1.53 -Slightly elevated from baseline -monitor urine output -daily bmp -Monitor/trend/replete electrolytes as needed -Avoid nephrotoxic agents were able  Essential hypertension -Blood pressure 116/71 -Continued on spironolactone, Toprol-XL, furosemide -PTA Entresto currently on hold due to soft blood  pressures -Vital signs per unit protocol  Hyperlipidemia -  On atorvastatin 40 mg daily  Perioperative cardiovascular examination    Ronnie Mann perioperative risk of a major cardiac event is 6.6% according to the Revised Cardiac Risk Index (RCRI).  Therefore, he is at high risk for perioperative complications.   His functional capacity is fair at 4.64 METs according to the [employer] Activity Status Index (DASI). Recommendations: The patient requires an echocardiogram before a disposition can be made regarding surgical risk.                Antiplatelet and/or Anticoagulation Recommendations: The patient should remain on Aspirin without interruption.       Risk Assessment/Risk Scores:        New York Heart Association (NYHA) Functional Class NYHA Class II        For questions or updates, please contact [facility] Please consult [email or web address] for contact info under    Signed, Ezekiel Menzer, NP  10/11/2023 12:15 PM

## 2023-10-11 NOTE — Assessment & Plan Note (Signed)
On Lipitor 

## 2023-10-11 NOTE — Plan of Care (Signed)

## 2023-10-11 NOTE — Progress Notes (Signed)
Heart Failure Stewardship Pharmacy Note  PCP: Pcp, No PCP-Cardiologist: Julien Nordmann, MD  HPI: Ronnie Mann is a 67 y.o. male with taghorn calculus, left hydronephrosis, CHF with EF 30-35%, HTN, HLD, CAD with NSTEMI s/p DES, CKD-3a, anemia, hiatal hernia, obesity who presented with shortness of breath, hematuria, and flank pain. Reports chest pain with coughing and left arm numbness, however reports that arm pain has been occurring for some time. Has experienced mild hematuria over the past few weeks, but safely worsened 2 days before admission. Cough and yellow sputum production has also been ongiong for several weeks, but shortness of brath acutely worsened 2 days ago after the severe onset of hematuria and flank pain. Troponin on admission was 33. BNP on admission was 975.7. CXR consistent with congestive HF and developing interstitial edema. CT renal study showed moderate right hydronephrosis with stranding representative of infection. Stenting is planned for 10/12/23.  Pertinent cardiac history: NSTEMI in 04/2023 s/p PCI to proximal LAD and RCA on 05/01/23. Echo at that time showed LVEF 30-35% with grade II diastolic dysfunction, moderate MR.  Pertinent Lab Values: Creatinine, Ser  Date Value Ref Range Status  10/11/2023 1.53 (H) 0.61 - 1.24 mg/dL Final   BUN  Date Value Ref Range Status  10/11/2023 39 (H) 8 - 23 mg/dL Final  56/21/3086 26 8 - 27 mg/dL Final   Potassium  Date Value Ref Range Status  10/11/2023 4.7 3.5 - 5.1 mmol/L Final   Sodium  Date Value Ref Range Status  10/11/2023 140 135 - 145 mmol/L Final  08/30/2023 136 134 - 144 mmol/L Final   B Natriuretic Peptide  Date Value Ref Range Status  10/10/2023 975.7 (H) 0.0 - 100.0 pg/mL Final    Comment:    Performed at [facility], [address] [place], [place] [address]   Magnesium  Date Value Ref Range Status  10/11/2023 2.0 1.7 - 2.4 mg/dL Final    Comment:    Performed at [facility],  [address] [place], [place] [address]   Hgb A1c MFr Bld  Date Value Ref Range Status  10/10/2023 6.1 (H) 4.8 - 5.6 % Final    Comment:    (NOTE) Pre diabetes:          5.7%-6.4%  Diabetes:              >6.4%  Glycemic control for   <7.0% adults with diabetes    TSH  Date Value Ref Range Status  04/28/2023 4.483 0.350 - 4.500 uIU/mL Final    Comment:    Performed by a 3rd Generation assay with a functional sensitivity of <=0.01 uIU/mL. Performed at [facility], [address] [place], [place] [address]     Vital Signs: Admission weight: none Temp:  [97.4 F (36.3 C)-98 F (36.7 C)] 97.4 F (36.3 C) (11/20 0726) Pulse Rate:  [72-109] 79 (11/20 1300) Cardiac Rhythm: Normal sinus rhythm (11/20 0726) Resp:  [17-45] 45 (11/20 1300) BP: (101-146)/(51-119) 118/76 (11/20 1300) SpO2:  [93 %-100 %] 100 % (11/20 1300) Weight:  [96.2 kg (212 lb)] 96.2 kg (212 lb) (11/19 1640)  Intake/Output Summary (Last 24 hours) at 10/11/2023 1324 Last data filed at 10/11/2023 1314 Gross per 24 hour  Intake 100 ml  Output 2250 ml  Net -2150 ml    Current Heart Failure Medications:  Loop diuretic: furosemide 40 mg IV bid Beta-Blocker: metoprolol succinate 25 mg daily ACEI/ARB/ARNI: MRA: spironolactone 25 mg daily SGLT2i: none  Prior  to admission Heart Failure Medications:  Loop diuretic: none Beta-Blocker: metoprolol succinate 25 mg bid ACEI/ARB/ARNI: Entresto 24-26 mg bid MRA: spironolactone 25 mg bid SGLT2i: none  Assessment: 1. Acute on chronic combined systolic and diastolic heart failure (LVEF 30-35%) with grade II diastolic dysfunction, due to ICM. NYHA class III symptoms.  -Symptoms: Reports shortness of breath has improved, but still has significant orthopnea. LEE seems to be improved. Reports chest pain is only with coughing now.  -Volume: Likely still hypervolemic, though difficult to assess. Suspect volume accumulated secondary to obstructive nephropathy.  Urine color is red from hematuria. Hopefully urine output improves with stenting. -Hemodynamics: BP labile from 100-130s/70s. HR 70s -BB: Taking metoprolol succinate 25 mg daily. Continue current dose. -ACEI/ARB/ARNI: Held due to labile BP. Can restart prior to discharge if BP stable. Verified with urology team that ARBs are fine with uretal stenting. -MRA: Continue spironolactone 25 mg daily -SGLT2i: Not a candidate for this class given chronic uretal obstruction and UTIs.  - Plan: 1) Medication changes recommended at this time: -None  2) Patient assistance: -Pending  3) Education: - Patient has been educated on current HF medications and potential additions to HF medication regimen - Patient verbalizes understanding that over the next few months, these medication doses may change and more medications may be added to optimize HF regimen - Patient has been educated on basic disease state pathophysiology and goals of therapy  Medication Assistance / Insurance Benefits Check: Does the patient have prescription insurance?    Type of insurance plan:  Does the patient qualify for medication assistance through manufacturers or grants? Pending   Outpatient Pharmacy: Prior to admission outpatient pharmacy: [facility]      Please do not hesitate to reach out with questions or concerns,  Enos Fling, PharmD, CPP, BCPS Heart Failure Pharmacist  Phone - [phone] 10/11/2023 1:24 PM

## 2023-10-11 NOTE — Assessment & Plan Note (Addendum)
Patient on aspirin and Toprol

## 2023-10-11 NOTE — Assessment & Plan Note (Addendum)
BMI 28.29 with current height and weight in computer

## 2023-10-12 ENCOUNTER — Encounter: Payer: Self-pay | Admitting: Internal Medicine

## 2023-10-12 ENCOUNTER — Inpatient Hospital Stay: Payer: Medicare HMO | Admitting: General Practice

## 2023-10-12 ENCOUNTER — Inpatient Hospital Stay: Payer: Medicare HMO

## 2023-10-12 ENCOUNTER — Encounter
Admission: EM | Disposition: A | Discharge status: Home / Self Care | Payer: Self-pay | Source: Home / Self Care | Attending: Internal Medicine

## 2023-10-12 DIAGNOSIS — Z01810 Encounter for preprocedural cardiovascular examination: Secondary | ICD-10-CM | POA: Diagnosis not present

## 2023-10-12 DIAGNOSIS — I2510 Atherosclerotic heart disease of native coronary artery without angina pectoris: Secondary | ICD-10-CM | POA: Diagnosis not present

## 2023-10-12 DIAGNOSIS — N201 Calculus of ureter: Secondary | ICD-10-CM

## 2023-10-12 DIAGNOSIS — I428 Other cardiomyopathies: Secondary | ICD-10-CM | POA: Diagnosis not present

## 2023-10-12 DIAGNOSIS — R079 Chest pain, unspecified: Secondary | ICD-10-CM | POA: Diagnosis not present

## 2023-10-12 DIAGNOSIS — N200 Calculus of kidney: Secondary | ICD-10-CM | POA: Diagnosis not present

## 2023-10-12 DIAGNOSIS — I5022 Chronic systolic (congestive) heart failure: Secondary | ICD-10-CM | POA: Diagnosis not present

## 2023-10-12 DIAGNOSIS — J209 Acute bronchitis, unspecified: Secondary | ICD-10-CM | POA: Diagnosis not present

## 2023-10-12 HISTORY — PX: CYSTOSCOPY W/ URETERAL STENT PLACEMENT: SHX1429

## 2023-10-12 LAB — RESPIRATORY PANEL BY PCR

## 2023-10-12 LAB — BASIC METABOLIC PANEL
Anion gap: 11 (ref 5–15)
BUN: 49 mg/dL — ABNORMAL HIGH (ref 8–23)
CO2: 26 mmol/L (ref 22–32)
Calcium: 9.1 mg/dL (ref 8.9–10.3)
Chloride: 104 mmol/L (ref 98–111)
Creatinine, Ser: 1.5 mg/dL — ABNORMAL HIGH (ref 0.61–1.24)
GFR, Estimated: 51 mL/min — ABNORMAL LOW (ref >60)
Glucose, Bld: 171 mg/dL — ABNORMAL HIGH (ref 70–99)
Potassium: 4.7 mmol/L (ref 3.5–5.1)
Sodium: 141 mmol/L (ref 135–145)

## 2023-10-12 LAB — CBC
HCT: 29.1 % — ABNORMAL LOW (ref 39.0–52.0)
Hemoglobin: 9.3 g/dL — ABNORMAL LOW (ref 13.0–17.0)
MCH: 29 pg (ref 26.0–34.0)
MCHC: 32 g/dL (ref 30.0–36.0)
MCV: 90.7 fL (ref 80.0–100.0)
Platelets: 191 10*3/uL (ref 150–400)
RBC: 3.21 MIL/uL — ABNORMAL LOW (ref 4.22–5.81)
RDW: 18.7 % — ABNORMAL HIGH (ref 11.5–15.5)
WBC: 6.2 10*3/uL (ref 4.0–10.5)
nRBC: 0 % (ref 0.0–0.2)

## 2023-10-12 LAB — URINE CULTURE: Culture: NO GROWTH

## 2023-10-12 SURGERY — CYSTOSCOPY, FLEXIBLE, WITH STENT REPLACEMENT
Anesthesia: General | Laterality: Right

## 2023-10-12 MED ORDER — MIDAZOLAM HCL 2 MG/2ML IJ SOLN
INTRAMUSCULAR | Status: AC
  Filled 2023-10-12: qty 2

## 2023-10-12 MED ORDER — PHENYLEPHRINE 80 MCG/ML (10ML) SYRINGE FOR IV PUSH (FOR BLOOD PRESSURE SUPPORT)
PREFILLED_SYRINGE | INTRAVENOUS | Status: DC | PRN
Start: 2023-10-12 — End: 2023-10-12
  Administered 2023-10-12 (×6): 160 ug via INTRAVENOUS

## 2023-10-12 MED ORDER — FENTANYL CITRATE (PF) 100 MCG/2ML IJ SOLN
INTRAMUSCULAR | Status: AC
  Filled 2023-10-12: qty 2

## 2023-10-12 MED ORDER — LIDOCAINE HCL URETHRAL/MUCOSAL 2 % EX GEL
CUTANEOUS | Status: AC
  Filled 2023-10-12: qty 10

## 2023-10-12 MED ORDER — FENTANYL CITRATE (PF) 100 MCG/2ML IJ SOLN
25.0000 ug | INTRAMUSCULAR | Status: DC | PRN
  Administered 2023-10-12 (×2): 25 ug via INTRAVENOUS

## 2023-10-12 MED ORDER — IOHEXOL 180 MG/ML  SOLN
INTRAMUSCULAR | Status: DC | PRN
  Administered 2023-10-12 (×3): 10 mL

## 2023-10-12 MED ORDER — PROPOFOL 500 MG/50ML IV EMUL
INTRAVENOUS | Status: DC | PRN
  Administered 2023-10-12: 150 ug/kg/min via INTRAVENOUS

## 2023-10-12 MED ORDER — PROPOFOL 1000 MG/100ML IV EMUL
INTRAVENOUS | Status: AC
  Filled 2023-10-12: qty 100

## 2023-10-12 MED ORDER — LIDOCAINE HCL 2 % EX GEL
CUTANEOUS | Status: DC | PRN
  Administered 2023-10-12: 1 via URETHRAL

## 2023-10-12 MED ORDER — SODIUM CHLORIDE 0.9 % IR SOLN
Status: DC | PRN
  Administered 2023-10-12: 1

## 2023-10-12 MED ORDER — LIDOCAINE HCL (CARDIAC) PF 100 MG/5ML IV SOSY
PREFILLED_SYRINGE | INTRAVENOUS | Status: DC | PRN
Start: 2023-10-12 — End: 2023-10-12
  Administered 2023-10-12: 100 mg via INTRAVENOUS

## 2023-10-12 MED ORDER — IPRATROPIUM-ALBUTEROL 0.5-2.5 (3) MG/3ML IN SOLN
3.0000 mL | Freq: Two times a day (BID) | RESPIRATORY_TRACT | Status: DC
  Administered 2023-10-12 – 2023-10-14 (×3): 3 mL via RESPIRATORY_TRACT
  Filled 2023-10-12 (×4): qty 3

## 2023-10-12 MED ORDER — OXYCODONE HCL 5 MG/5ML PO SOLN: 5.0000 mg | Freq: Once | ORAL | Status: DC | PRN

## 2023-10-12 MED ORDER — OXYCODONE HCL 5 MG PO TABS: 5.0000 mg | ORAL_TABLET | Freq: Once | ORAL | Status: DC | PRN

## 2023-10-12 MED ORDER — LACTATED RINGERS IV SOLN: INTRAVENOUS | Status: DC | PRN

## 2023-10-12 SURGICAL SUPPLY — 18 items
BAG DRAIN SIEMENS DORNER NS (MISCELLANEOUS) ×1 IMPLANT
BRUSH SCRUB EZ 4% CHG (MISCELLANEOUS) IMPLANT
CATH URETL OPEN 5X70 (CATHETERS) IMPLANT
CATH URETL OPEN END 4X70 (CATHETERS) IMPLANT
CNTNR URN SCR LID CUP LEK RST (MISCELLANEOUS) IMPLANT
GLOVE BIOGEL PI IND STRL 7.5 (GLOVE) ×1 IMPLANT
GOWN STRL REUS W/ TWL XL LVL3 (GOWN DISPOSABLE) ×1 IMPLANT
GUIDEWIRE ANG ZIPWIRE 035X150 (WIRE) IMPLANT
GUIDEWIRE STR DUAL SENSOR (WIRE) ×1 IMPLANT
GUIDEWIRE STR ZIPWIRE 035X150 (MISCELLANEOUS) IMPLANT
IV NS IRRIG 3000ML ARTHROMATIC (IV SOLUTION) ×1 IMPLANT
KIT TURNOVER CYSTO (KITS) ×1 IMPLANT
PACK CYSTO AR (MISCELLANEOUS) ×1 IMPLANT
SET CYSTO W/LG BORE CLAMP LF (SET/KITS/TRAYS/PACK) ×1 IMPLANT
STENT PERCUFLEX 4.8FRX26 (STENTS) IMPLANT
STENT URET 6FRX28 CONTOUR (STENTS) IMPLANT
SURGILUBE 2OZ TUBE FLIPTOP (MISCELLANEOUS) ×1 IMPLANT
WATER STERILE IRR 500ML POUR (IV SOLUTION) ×1 IMPLANT

## 2023-10-12 NOTE — Progress Notes (Addendum)
   Patient Name: Ronnie Mann Date of Encounter: 10/12/2023 [facility] Cardiologist: Julien Nordmann, MD   Interval Summary  .    Patient seen on a.m. rounds.  Denies any chest pain or shortness of breath.  States that he has been doing better overnight with decreased pain.  Continues to note some hematuria. Received steroids, nebulizers, breathing better  Vital Signs .    Vitals:   10/12/23 0357 10/12/23 0526 10/12/23 0800 10/12/23 0933  BP: (!) 89/73   126/72  Pulse: 87   86  Resp: 18   16  Temp: 97.7 F (36.5 C)   97.6 F (36.4 C)  TempSrc: Oral   Oral  SpO2: 100%  100% 95%  Weight:  96.2 kg    Height:        Intake/Output Summary (Last 24 hours) at 10/12/2023 1256 Last data filed at 10/12/2023 1245 Gross per 24 hour  Intake 346.67 ml  Output 1650 ml  Net -1303.33 ml      10/12/2023    5:26 AM 10/10/2023    4:40 PM 08/30/2023    1:58 PM  Last 3 Weights  Weight (lbs) 212 lb 1.3 oz 212 lb 201 lb 12.8 oz  Weight (kg) 96.2 kg 96.163 kg 91.536 kg      Telemetry/ECG    Sinus rhythm with PACs- Personally Reviewed  Physical Exam .   GEN: No acute distress.   Neck: No JVD Cardiac: RRR, no murmurs, rubs, or gallops.  Respiratory: Clear on the left with right sided coarse in the base to auscultation bilaterally.Respirations are unlabored at rest on 2L O2 via [place] GI: Soft, tender, non-distended  MS: No edema  Assessment & Plan .     Preop cardiovascular evaluation Acceptable risk for urologic procedure tomorrow, no further cardiac testing needed.  Can proceed without repeat echocardiogram findings   Chest pain Atypical in nature, presents with coughing, palpation on his chest Likely chest wall pain Symptoms better today Known coronary disease with stable angina Troponin minimally elevated nontrending EKG unchanged Would restart Brilinta following urologic procedure tomorrow No further ischemic workup needed at this time   Cardiomyopathy Known  ejection fraction 30 to 35% in June 2024 Current shortness of breath cough sputum likely secondary to bronchitis as below Symptoms are improved after steroids, nebulizers Limited echo ordered to update ejection fraction on medications post non-STEMI in June As outpatient on Entresto, spironolactone, metoprolol Entresto on hold for now given hypotension Could restart Entresto and Lasix at discharge if blood pressure stable   Acute bronchitis Symptoms for the past several weeks, getting worse in the past week Coughing contributing to chest wall pain Feels better after steroids and nebulizers, reports coughing lots of stuff up   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   Medication Recommendations: Would resume outpatient medications at discharge Other recommendations (labs, testing, etc): No further testing Follow up as an outpatient: Outpatient follow-up in clinic   For questions or updates, please contact [facility] Please consult [email or web address] for contact info under     Signed, Dossie Arbour, MD, Ph.D [facility]

## 2023-10-12 NOTE — Interval H&P Note (Signed)
History and Physical Interval Note:  10/12/2023 12:06 PM  [name]  has presented today for surgery, with the diagnosis of Right Hydronephrosis, Right Ureteral Stone.  The various methods of treatment have been discussed with the patient and family. After consideration of risks, benefits and other options for treatment, the patient has consented to  Procedure(s): CYSTOSCOPY WITH STENT REPLACEMENT (Right) as a surgical intervention.  The patient's history has been reviewed, patient examined, no change in status, stable for surgery.  I have reviewed the patient's chart and labs.  Questions were answered to the patient's satisfaction.    CV:RRR Lungs:clear  Ronnie Mann

## 2023-10-12 NOTE — Progress Notes (Signed)
Heart Failure Nurse Navigator Progress Note  PCP: Pcp, No PCP-Cardiologist: Julien Nordmann, MD Admission Diagnosis: Acute congestive heart failure. Admitted from: Home  Presentation:   Ronnie Mann presented with shortness of breath, bilateral flank pain radiating towards lower abdomen, more on the right than left, dysuria and gross hematuria. Patient reported having mild chest pain and shortness of breath for 2 days. BNP 975.7. Troponin 33.  ECHO/ LVEF: 05/01/23 (30-35%)  Clinical Course:  Past Medical History:  Diagnosis Date   Coronary artery disease 05/01/2023   PCI/DES   HFrEF (heart failure with reduced ejection fraction) (HCC) 05/01/2023   Hiatal hernia    Ischemic cardiomyopathy 05/01/2023   LVEF 30-35%   Kidney stones      Social History   Socioeconomic History   Marital status: Divorced    Spouse name: Not on file   Number of children: Not on file   Years of education: Not on file   Highest education level: Not on file  Occupational History   Not on file  Tobacco Use   Smoking status: Never    Passive exposure: Never   Smokeless tobacco: Never  Substance and Sexual Activity   Alcohol use: Never   Drug use: Never   Sexual activity: Not on file  Other Topics Concern   Not on file  Social History Narrative   Not on file   Social Determinants of Health   Financial Resource Strain: Not on file  Food Insecurity: No Food Insecurity (10/11/2023)   Hunger Vital Sign    Worried About Running Out of Food in the Last Year: Never true    Ran Out of Food in the Last Year: Never true  Transportation Needs: No Transportation Needs (10/11/2023)   PRAPARE - Administrator, Civil Service (Medical): No    Lack of Transportation (Non-Medical): No  Physical Activity: Not on file  Stress: Not on file  Social Connections: Not on file   Education Assessment and Provision:  Detailed education and instructions provided on heart failure disease management  including the following:  Signs and symptoms of Heart Failure When to call the physician Importance of daily weights Low sodium diet Fluid restriction Medication management Anticipated future follow-up appointments  Patient education given on each of the above topics.  Patient acknowledges understanding via teach back method and acceptance of all instructions.  Education Materials:  "Living Better With Heart Failure" Booklet, HF zone tool, & Daily Weight Tracker Tool.  Patient has scale at home: Yes Patient has pill box at home: Yes    High Risk Criteria for Readmission and/or Poor Patient Outcomes: Heart failure hospital admissions (last 6 months): 1  No Show rate: 20% Difficult social situation: None Demonstrates medication adherence: Yes Primary Language: English Literacy level: Reading, Writing, & Comprehension  Barriers of Care:   Diet & Fluid Restrictions Daily Weights Medication Compliance  Considerations/Referrals:   Referral made to Heart Failure Pharmacist Stewardship: Yes Referral made to CSW/NCM TOC: No Referral made to Heart & Vascular TOC clinic: Yes-Patient follow-up 10/23/23.  Pt aware of appointment date and time.  Items for Follow-up on DC/TOC: Diet & Fluid Restrictions Daily Weights Medication Compliance/Organization of pills Continued Heart Failure Education   Roxy Horseman, RN, BSN [facility] Heart Failure Navigator Secure Chat Only

## 2023-10-12 NOTE — Progress Notes (Signed)
Heart Failure Stewardship Pharmacy Note  PCP: Pcp, No PCP-Cardiologist: Julien Nordmann, MD  HPI: Ronnie Mann is a 67 y.o. male with taghorn calculus, left hydronephrosis, CHF with EF 30-35%, HTN, HLD, CAD with NSTEMI s/p DES, CKD-3a, anemia, hiatal hernia, obesity who presented with shortness of breath, hematuria, and flank pain. Reports chest pain with coughing and left arm numbness, however reports that arm pain has been occurring for some time. Has experienced mild hematuria over the past few weeks, but safely worsened 2 days before admission. Cough and yellow sputum production has also been ongiong for several weeks, but shortness of brath acutely worsened 2 days ago after the severe onset of hematuria and flank pain. Troponin on admission was 33. BNP on admission was 975.7. CXR consistent with congestive HF and developing interstitial edema. CT renal study showed moderate right hydronephrosis with stranding representative of infection. Stenting is planned for 10/12/23.  Pertinent cardiac history: NSTEMI in 04/2023 s/p PCI to proximal LAD and RCA on 05/01/23. Echo at that time showed LVEF 30-35% with grade II diastolic dysfunction, moderate MR.  Pertinent Lab Values: Creatinine, Ser  Date Value Ref Range Status  10/12/2023 1.50 (H) 0.61 - 1.24 mg/dL Final   BUN  Date Value Ref Range Status  10/12/2023 49 (H) 8 - 23 mg/dL Final  16/08/9603 26 8 - 27 mg/dL Final   Potassium  Date Value Ref Range Status  10/12/2023 4.7 3.5 - 5.1 mmol/L Final   Sodium  Date Value Ref Range Status  10/12/2023 141 135 - 145 mmol/L Final  08/30/2023 136 134 - 144 mmol/L Final   B Natriuretic Peptide  Date Value Ref Range Status  10/10/2023 975.7 (H) 0.0 - 100.0 pg/mL Final    Comment:    Performed at [facility], [address] [place], [place] [address]   Magnesium  Date Value Ref Range Status  10/11/2023 2.0 1.7 - 2.4 mg/dL Final    Comment:    Performed at [facility],  [address] [place], [place] [address]   Hgb A1c MFr Bld  Date Value Ref Range Status  10/10/2023 6.1 (H) 4.8 - 5.6 % Final    Comment:    (NOTE) Pre diabetes:          5.7%-6.4%  Diabetes:              >6.4%  Glycemic control for   <7.0% adults with diabetes    TSH  Date Value Ref Range Status  04/28/2023 4.483 0.350 - 4.500 uIU/mL Final    Comment:    Performed by a 3rd Generation assay with a functional sensitivity of <=0.01 uIU/mL. Performed at [facility], [address] [place], [place] [address]    Vital Signs: Admission weight: 212 lbs Temp:  [97.7 F (36.5 C)-97.9 F (36.6 C)] 97.7 F (36.5 C) (11/21 0357) Pulse Rate:  [72-105] 87 (11/21 0357) Cardiac Rhythm: Normal sinus rhythm (11/20 1407) Resp:  [16-45] 18 (11/21 0357) BP: (89-138)/(54-119) 89/73 (11/21 0357) SpO2:  [97 %-100 %] 100 % (11/21 0357) Weight:  [96.2 kg (212 lb 1.3 oz)] 96.2 kg (212 lb 1.3 oz) (11/21 0526)  Intake/Output Summary (Last 24 hours) at 10/12/2023 0737 Last data filed at 10/12/2023 0411 Gross per 24 hour  Intake 220 ml  Output 2000 ml  Net -1780 ml    Current Heart Failure Medications:  Loop diuretic: none Beta-Blocker: metoprolol succinate 25 mg daily ACEI/ARB/ARNI: none MRA: spironolactone 25 mg daily SGLT2i: none  Prior to admission Heart Failure Medications:  Loop diuretic: none Beta-Blocker: metoprolol succinate 25 mg bid ACEI/ARB/ARNI: Entresto 24-26 mg bid MRA: spironolactone 25 mg bid SGLT2i: none  Assessment: 1. Acute on chronic combined systolic and diastolic heart failure (LVEF 30-35%) with grade II diastolic dysfunction, due to ICM. NYHA class III symptoms.  -Symptoms: Reports shortness of breath has improved, but still has mild orthopnea. Kidney pain remains present. Hypotension is asymptomatic. Reports breathing improved with nebs. Is having leg cramps. -Volume: Volume is difficult to assess. Suspect volume accumulated secondary to  obstructive nephropathy. Urine color remains red from hematuria. Hopefully urine output improves with stenting. Agree with holding diuretics today. -Hemodynamics: BP low this AM. HR 80s. May benefit from holding AM meds prior to the procedure. -BB: On metoprolol succinate 25 mg daily. -ACEI/ARB/ARNI: Held due to labile BP. Verified with urology team that ARBs are fine with uretal stenting. Can consider restart prior to discharge if BP stable, but would not recommend at this time. -MRA: On spironolactone 25 mg daily. -SGLT2i: Not a candidate for this class given chronic uretal obstruction and UTIs.  - Plan: 1) Medication changes recommended at this time: -Can consider holding metoprolol/spironolactone today if BP remains soft prior to the procedure.  2) Patient assistance: -Pending  3) Education: - Patient has been educated on current HF medications and potential additions to HF medication regimen - Patient verbalizes understanding that over the next few months, these medication doses may change and more medications may be added to optimize HF regimen - Patient has been educated on basic disease state pathophysiology and goals of therapy  Medication Assistance / Insurance Benefits Check: Does the patient have prescription insurance?    Type of insurance plan:  Does the patient qualify for medication assistance through manufacturers or grants? Pending   Outpatient Pharmacy: Prior to admission outpatient pharmacy: [facility]      Please do not hesitate to reach out with questions or concerns,  Enos Fling, PharmD, CPP, BCPS Heart Failure Pharmacist  Phone - [phone] 10/12/2023 7:37 AM

## 2023-10-12 NOTE — Op Note (Signed)
Preoperative diagnosis:  Right UPJ/proximal ureteral calculus Right mid ureteral calculus  Postoperative diagnosis:  Right UPJ/proximal ureteral calculus Right mid ureteral calculus-impacted  Procedure:  Cystoscopy Right ureteral stent placement (4.41F/26 cm Percuflex Plus)  Right retrograde pyelography with interpretation   Surgeon: Lorin Picket C. Samir Ishaq, M.D.  Anesthesia: MAC  Complications: None  Intraoperative findings:  Cystoscopy: Urethra normal in caliber without stricture; prostate mild lateral lobe enlargement/mild bladder neck elevation; UOs normal-appearing bilaterally.  No solid or papillary lesions identified Right retrograde pyelogram: Right mid ureteral calculus with proximal hydroureter.  Upper pole partial staghorn calculus and right renal pelvic/proximal ureteral calculus.  No contrast extravasation noted  EBL: Minimal  Specimens: None  Indication: Ronnie Mann is a 67 y.o. male with a long history of recurrent stone disease.  Refer to Dr. Keane Scrape previous office note 06/14/2023 for a clinical summary.  He has not had his appointment at [facility] and is scheduled for November 24, 2023.    He presented to the ED 10/09/2024 with right flank pain and shortness of breath.  CT remarkable for migration of his lower pole calculi to the right mid ureter and right renal pelvis/proximal ureter with hydronephrosis.  He has longstanding obstruction of the left kidney with significant renal atrophy.  After reviewing the management options for treatment, he elected to proceed with the above surgical procedure(s). We have discussed the potential benefits and risks of the procedure, side effects of the proposed treatment, the likelihood of the patient achieving the goals of the procedure, and any potential problems that might occur during the procedure or recuperation. Informed consent has been obtained.  Description of procedure:  The patient was taken to the operating room and sedation  was obtained by anesthesia.  The patient was placed in the dorsal lithotomy position, prepped and draped in the usual sterile fashion, and preoperative antibiotics were administered. A preoperative time-out was performed.   A Uro-Jet was instilled per urethra and a 21 French cystoscope was lubricated, inserted per urethra and advanced into the bladder under direct vision with findings as described above.  A 0.038 Sensor guidewire was then advanced up the right ureter however could not be advanced proximal to the right mid ureteral calculus which was easily visualized on fluoroscopy.  A 72F ureteral catheter was placed through the working channel of the cystoscope and into the right ureter just distal to the calculus and a 0.035 Zip-wire was placed through the catheter and was able to be negotiated passed the calculus and advanced into a midpole calyx.  The 72F ureteral catheter was unable to be advanced passed the calculus secondary to stone impaction.  The ureteral catheter and cystoscope were removed and the cystoscope was repassed alongside the sensor wire.  The ureteral catheter was replaced proximal to the level of the calculus and the Sensor wire was able to be advanced through the catheter proximal to the stone and into the renal pelvis.  The ureteral catheter was advanced over the Sensor wire into the renal pelvis and 5 cc of blood-tinged urine was aspirated and sent for culture.   The guidewire was replaced and the ureteral catheter was removed.  A 32F/28 cm Contour ureteral stent was unable to be advanced over the wire beyond the calculus.  A 4.41F/26 cm Percuflex Plus ureteral stent was able to be advanced over the wire into the upper pole calyx.  Good positioning was noted proximally under fluoroscopy and distally under direct vision.  The bladder was emptied and the  cystoscope was removed.  Marland Kitchen  He was transported to the PACU in stable condition.  Plan: Follow creatinine Stent will remain  indwelling until his upcoming appointment at [facility], [place]

## 2023-10-12 NOTE — Progress Notes (Signed)
Progress Note   Patient: Ronnie Mann:[id number] DOB: 1956/08/27 DOA: 10/10/2023     2 DOS: the patient was seen and examined on 10/12/2023   Brief hospital course: 67 year old man past medical history of staghorn calculus and left hydronephrosis, CHF with EF of 30 to 35%, hypertension, hyperlipidemia, CAD, CKD stage IIIa, anemia, hiatal hernia, obesity presented with shortness of breath hematuria and flank pain.  Patient was started on IV Lasix for CHF exacerbation.  Patient complains he has a cold.  Patient also urinating some blood.  11/20.  Cardiology believes this is likely more bronchitis rather than CHF.  Will hold Lasix after this afternoon's dose.  I will send off a viral respiratory panel.  Give a dose of Solu-Medrol. 11/21.  Viral respiratory panel pending.  Patient breathing little bit better this morning.  Stent placement by urology today.  Assessment and Plan: * Acute bronchitis Continue nebulizer treatments and Solu-Medrol.  Viral respiratory panel negative COVID test and influenza negative.  Chronic systolic CHF (congestive heart failure) [facility]) Cardiology believes this is more like acute bronchitis rather than acute on chronic systolic congestive heart failure.  Lasix will be held this a.m.  Continue spironolactone and Toprol  Right nephrolithiasis Obstructing stone and right hydronephrosis.  Urology took for stent procedure today.  Urology will not place a stent on the left side secondary to chronic findings of staghorn calculus and left-sided chronic hydronephrosis.  CAD (coronary artery disease) Patient on aspirin and Toprol  Myocardial injury Troponin borderline at 29.  This is not a myocardial infarction.  Essential hypertension Continue spironolactone and Toprol  Chronic kidney disease, stage 3b (HCC) Creatinine creeped up a little bit to 1.50 with a GFR 51.  Continue to monitor  Complicated UTI (urinary tract infection) Continue Rocephin and follow-up  urine culture  Obesity (BMI 30-39.9) BMI 31.32 with current height and weight in computer  HLD (hyperlipidemia) On Lipitor        Subjective: Patient felt a little bit better this morning.  Was breathing a little bit better.  Viral respiratory panel pending.  Still has a little bit of cough and some congestion.  Patient seen this morning before urology stent procedure.  Came in with shortness of breath and abdominal pain.  Found to have an obstructing kidney stone and hydronephrosis.  Physical Exam: Vitals:   10/12/23 1427 10/12/23 1430 10/12/23 1445 10/12/23 1500  BP:  123/86 131/70 123/75  Pulse: 80 85 70 62  Resp: 16 (!) 21 18 13   Temp: (!) 97 F (36.1 C)   (!) 97.1 F (36.2 C)  TempSrc:      SpO2: 97% 98% 97% 99%  Weight:      Height:       Physical Exam HENT:     Head: Normocephalic.     Mouth/Throat:     Pharynx: No oropharyngeal exudate.  Eyes:     General: Lids are normal.     Conjunctiva/sclera: Conjunctivae normal.  Cardiovascular:     Rate and Rhythm: Normal rate and regular rhythm.     Heart sounds: Normal heart sounds, S1 normal and S2 normal.  Pulmonary:     Breath sounds: Examination of the right-lower field reveals decreased breath sounds and rhonchi. Examination of the left-lower field reveals decreased breath sounds and rhonchi. Decreased breath sounds and rhonchi present. No wheezing or rales.  Abdominal:     Palpations: Abdomen is soft.     Tenderness: There is abdominal tenderness in the right lower  quadrant.  Musculoskeletal:     Right lower leg: Swelling present.     Left lower leg: Swelling present.  Skin:    General: Skin is warm.     Findings: No rash.  Neurological:     Mental Status: He is alert and oriented to person, place, and time.     Data Reviewed: Creatinine 1.5, hemoglobin 9.3  Family Communication: Updated son on the phone  Disposition: Status is: Inpatient Remains inpatient appropriate because:   Planned Discharge  Destination: Home    Time spent: 28 minutes  Author: Alford Highland, MD 10/12/2023 3:13 PM  For on call review [email or web address].

## 2023-10-12 NOTE — Anesthesia Postprocedure Evaluation (Signed)
Anesthesia Post Note  Patient: Ronnie Mann  Procedure(s) Performed: CYSTOSCOPY WITH STENT REPLACEMENT (Right)  Patient location during evaluation: PACU Anesthesia Type: General Level of consciousness: awake and alert Pain management: pain level controlled Vital Signs Assessment: post-procedure vital signs reviewed and stable Respiratory status: spontaneous breathing, nonlabored ventilation, respiratory function stable and patient connected to nasal cannula oxygen Cardiovascular status: blood pressure returned to baseline and stable Postop Assessment: no apparent nausea or vomiting Anesthetic complications: no  There were no known notable events for this encounter.   Last Vitals:  Vitals:   10/12/23 0933 10/12/23 1342  BP: 126/72 112/69  Pulse: 86 72  Resp: 16 18  Temp: 36.4 C (!) 36.1 C  SpO2: 95% 100%    Last Pain:  Vitals:   10/12/23 1036  TempSrc:   PainSc: 0-No pain                 Stephanie Coup

## 2023-10-12 NOTE — Transfer of Care (Signed)
Immediate Anesthesia Transfer of Care Note  Patient: Ronnie Mann  Procedure(s) Performed: CYSTOSCOPY WITH STENT REPLACEMENT (Right)  Patient Location: PACU  Anesthesia Type:General  Level of Consciousness: drowsy and patient cooperative  Airway & Oxygen Therapy: Patient Spontanous Breathing and Patient connected to face mask oxygen  Post-op Assessment: Report given to RN and Post -op Vital signs reviewed and stable  Post vital signs: stable semi-sitting position with 10 oral airway in and oxygen mask with 10L wall O2  Last Vitals:  Vitals Value Taken Time  BP 105/70 10/12/23 1345  Temp    Pulse 72 10/12/23 1346  Resp 16 10/12/23 1346  SpO2 100 % 10/12/23 1346  Vitals shown include unfiled device data.  Last Pain:  Vitals:   10/12/23 1036  TempSrc:   PainSc: 0-No pain      Patients Stated Pain Goal: 0 (10/11/23 1500)  Complications: No notable events documented.

## 2023-10-12 NOTE — Anesthesia Preprocedure Evaluation (Signed)
Anesthesia Evaluation  Patient identified by MRN, date of birth, ID band Patient awake    Reviewed: Allergy & Precautions, NPO status , Patient's Chart, lab work & pertinent test results  History of Anesthesia Complications Negative for: history of anesthetic complications  Airway Mallampati: III  TM Distance: >3 FB Neck ROM: full    Dental  (+) Dental Advidsory Given   Pulmonary shortness of breath and with exertion   Pulmonary exam normal        Cardiovascular hypertension, + CAD, + Past MI and +CHF  Normal cardiovascular exam+ Valvular Problems/Murmurs      Neuro/Psych negative neurological ROS  negative psych ROS   GI/Hepatic negative GI ROS, Neg liver ROS,,,  Endo/Other  diabetes    Renal/GU Renal disease  negative genitourinary   Musculoskeletal   Abdominal   Peds  Hematology negative hematology ROS (+)   Anesthesia Other Findings Patient with PMH of acute on chronic CHF. Patient came to the hospital with shortness of breath. Patient's EF is 30-35% with grade 2 diastolic dysfunction. Patient was seen by cardiology and ordered echo prior to going to surgery. Urology is declaring the case emergent and we will proceed without an echo. Patient seen and examined in bed. Patient states he feels signficantly better than yesterday. Patient still on 2L of oxygen, does not use O2 at home, but states that he is breathing much better than yesterday. Discussed the case with the surgeon, will proceed with a deep sedation to avoid placing ETT.   Past Medical History: 05/01/2023: Coronary artery disease     Comment:  PCI/DES 05/01/2023: HFrEF (heart failure with reduced ejection fraction) (HCC) No date: Hiatal hernia 05/01/2023: Ischemic cardiomyopathy     Comment:  LVEF 30-35% No date: Kidney stones  Past Surgical History: 05/01/2023: CORONARY STENT INTERVENTION; N/A     Comment:  Procedure: CORONARY STENT INTERVENTION;   Surgeon: Iran Ouch, MD;  Location: [facility]    INVASIVE CV LAB;                Service: Cardiovascular;  Laterality: N/A; 05/01/2023: LEFT HEART CATH AND CORONARY ANGIOGRAPHY; N/A     Comment:  Procedure: LEFT HEART CATH AND CORONARY ANGIOGRAPHY;                Surgeon: Iran Ouch, MD;  Location: [facility] INVASIVE               CV LAB;  Service: Cardiovascular;  Laterality: N/A;  BMI    Body Mass Index: 31.32 kg/m      Reproductive/Obstetrics negative OB ROS                             Anesthesia Physical Anesthesia Plan  ASA: 4 and emergent  Anesthesia Plan: General   Post-op Pain Management:    Induction: Intravenous  PONV Risk Score and Plan: 2 and Ondansetron, Dexamethasone, Propofol infusion, TIVA and Midazolam  Airway Management Planned: Nasal Cannula and Natural Airway  Additional Equipment:   Intra-op Plan:   Post-operative Plan:   Informed Consent: I have reviewed the patients History and Physical, chart, labs and discussed the procedure including the risks, benefits and alternatives for the proposed anesthesia with the patient or authorized representative who has indicated his/her understanding and acceptance.     Dental Advisory Given  Plan Discussed with: Anesthesiologist, CRNA and Surgeon  Anesthesia Plan Comments: (Patient consented for risks of anesthesia including but not limited to:  - adverse reactions to medications - risk of airway placement if required - damage to eyes, teeth, lips or other oral mucosa - nerve damage due to positioning  - sore throat or hoarseness - Damage to heart, brain, nerves, lungs, other parts of body or loss of life  Patient voiced understanding and assent.)        Anesthesia Quick Evaluation

## 2023-10-13 ENCOUNTER — Inpatient Hospital Stay (HOSPITAL_COMMUNITY)
Admit: 2023-10-13 | Discharge: 2023-10-13 | Disposition: A | Discharge status: Home / Self Care | Payer: Medicare HMO | Attending: Cardiology | Admitting: Cardiology

## 2023-10-13 ENCOUNTER — Encounter: Payer: Self-pay | Admitting: Urology

## 2023-10-13 DIAGNOSIS — I5022 Chronic systolic (congestive) heart failure: Secondary | ICD-10-CM

## 2023-10-13 DIAGNOSIS — N1339 Other hydronephrosis: Secondary | ICD-10-CM | POA: Diagnosis not present

## 2023-10-13 DIAGNOSIS — N200 Calculus of kidney: Secondary | ICD-10-CM | POA: Diagnosis not present

## 2023-10-13 DIAGNOSIS — J209 Acute bronchitis, unspecified: Secondary | ICD-10-CM | POA: Diagnosis not present

## 2023-10-13 LAB — BASIC METABOLIC PANEL
Anion gap: 11 (ref 5–15)
BUN: 52 mg/dL — ABNORMAL HIGH (ref 8–23)
CO2: 25 mmol/L (ref 22–32)
Calcium: 8.8 mg/dL — ABNORMAL LOW (ref 8.9–10.3)
Chloride: 102 mmol/L (ref 98–111)
Creatinine, Ser: 1.57 mg/dL — ABNORMAL HIGH (ref 0.61–1.24)
GFR, Estimated: 48 mL/min — ABNORMAL LOW (ref >60)
Glucose, Bld: 131 mg/dL — ABNORMAL HIGH (ref 70–99)
Potassium: 4.3 mmol/L (ref 3.5–5.1)
Sodium: 138 mmol/L (ref 135–145)

## 2023-10-13 LAB — CBC
HCT: 31 % — ABNORMAL LOW (ref 39.0–52.0)
Hemoglobin: 9.7 g/dL — ABNORMAL LOW (ref 13.0–17.0)
MCH: 28.9 pg (ref 26.0–34.0)
MCHC: 31.3 g/dL (ref 30.0–36.0)
MCV: 92.3 fL (ref 80.0–100.0)
Platelets: 196 10*3/uL (ref 150–400)
RBC: 3.36 MIL/uL — ABNORMAL LOW (ref 4.22–5.81)
RDW: 18.3 % — ABNORMAL HIGH (ref 11.5–15.5)
WBC: 7.7 10*3/uL (ref 4.0–10.5)
nRBC: 0 % (ref 0.0–0.2)

## 2023-10-13 LAB — ECHOCARDIOGRAM LIMITED
Height: 69 in
MV M vel: 4.66 m/s
MV Peak grad: 86.9 mm[Hg]
Radius: 0.6 cm
S' Lateral: 5.1 cm
Weight: 3065.28 [oz_av]

## 2023-10-13 MED ORDER — OXYBUTYNIN CHLORIDE 5 MG PO TABS: 5.0000 mg | ORAL_TABLET | Freq: Three times a day (TID) | ORAL | Status: DC | PRN

## 2023-10-13 MED ORDER — LACTULOSE 10 GM/15ML PO SOLN: 30.0000 g | Freq: Every day | ORAL | Status: DC | PRN

## 2023-10-13 MED ORDER — POLYETHYLENE GLYCOL 3350 17 G PO PACK
17.0000 g | PACK | Freq: Every day | ORAL | Status: DC
  Administered 2023-10-13 – 2023-10-14 (×2): 17 g via ORAL
  Filled 2023-10-13 (×2): qty 1

## 2023-10-13 MED ORDER — ALPRAZOLAM 0.25 MG PO TABS
0.2500 mg | ORAL_TABLET | Freq: Once | ORAL | Status: AC
  Administered 2023-10-13: 0.25 mg via ORAL
  Filled 2023-10-13: qty 1

## 2023-10-13 MED ORDER — TAMSULOSIN HCL 0.4 MG PO CAPS
0.4000 mg | ORAL_CAPSULE | Freq: Every day | ORAL | Status: DC
  Administered 2023-10-13: 0.4 mg via ORAL
  Filled 2023-10-13: qty 1

## 2023-10-13 NOTE — Care Management Important Message (Signed)
Important Message  Patient Details  Name: Ronnie Mann MRN: [id number] Date of Birth: 22-Dec-1955   Important Message Given:  N/A - LOS <3 / Initial given by admissions     Olegario Messier A Ines Rebel 10/13/2023, 1:59 PM

## 2023-10-13 NOTE — Progress Notes (Signed)
CSW remote today, called into patient room per RN he's anxious, patient in background asking CSW to call back.   Per MD patient needs PCP  Patient has [employer], patient can call or go online at [email or web address] to choose a PCP in network with his plan.   List of local PCP's to choose from added to AVS.   Darolyn Rua, LCSW, MSW, [facility] [phone]

## 2023-10-13 NOTE — Progress Notes (Signed)
*  PRELIMINARY RESULTS* Echocardiogram A Limited 2D Echocardiogram has been performed.  Ronnie Mann 10/13/2023, 10:35 AM

## 2023-10-13 NOTE — Plan of Care (Signed)
  Problem: Education: Goal: Knowledge of General Education information will improve Description: Including pain rating scale, medication(s)/side effects and non-pharmacologic comfort measures Outcome: Progressing   Problem: Clinical Measurements: Goal: Respiratory complications will improve Outcome: Progressing Goal: Cardiovascular complication will be avoided Outcome: Progressing   Problem: Pain Management: Goal: General experience of comfort will improve Outcome: Not Progressing

## 2023-10-13 NOTE — Progress Notes (Signed)
Progress Note   Patient: Ronnie Mann:[id number] DOB: 1956-06-26 DOA: 10/10/2023     3 DOS: the patient was seen and examined on 10/13/2023   Brief hospital course: 67 year old man past medical history of staghorn calculus and left hydronephrosis, CHF with EF of 30 to 35%, hypertension, hyperlipidemia, CAD, CKD stage IIIa, anemia, hiatal hernia, obesity presented with shortness of breath hematuria and flank pain.  Patient was started on IV Lasix for CHF exacerbation.  Patient complains he has a cold.  Patient also urinating some blood.  11/20.  Cardiology believes this is likely more bronchitis rather than CHF.  Will hold Lasix after this afternoon's dose.  I will send off a viral respiratory panel.  Give a dose of Solu-Medrol. 11/21.  Viral respiratory panel negative.  Patient breathing little bit better this morning.  Stent placement by urology today. 11/22.  Patient was very upset about the insurance company not being able to help him with setting up a primary care physician, it was hard to focus him on anything else today.  Assessment and Plan: * Acute bronchitis Continue nebulizer treatments and Solu-Medrol.  Viral respiratory panel negative COVID test and influenza negative.  Chronic systolic CHF (congestive heart failure) [facility]) Cardiology believes this is more like acute bronchitis rather than acute on chronic systolic congestive heart failure.  Lasix will be held this a.m.  Continue spironolactone and Toprol.  EF 25 to 30% with moderate mitral regurgitation.  Right nephrolithiasis Obstructing stone and right hydronephrosis.  Urology placed a right ureteral stent on 11/21.  Urology will not place a stent on the left side secondary to chronic left-sided chronic hydronephrosis.  They recommend the stent stay in until seen on January 3 by urology at [facility].  CAD (coronary artery disease) Patient on aspirin and Toprol  Myocardial injury Troponin borderline at 29.  This is not a  myocardial infarction.  Essential hypertension Continue spironolactone and Toprol  Chronic kidney disease, stage 3b (HCC) Creatinine creeped up a little bit to 1.57 with a GFR 48.  Continue to monitor  Complicated UTI (urinary tract infection) Initial urine culture negative, urine culture from cystoscopy negative for 12 hours.  Continue Rocephin for right now but likely will discontinue soon.  Obesity (BMI 30-39.9) BMI 28.29 with current height and weight in computer  HLD (hyperlipidemia) On Lipitor        Subjective: Patient very upset about the insurance company not being able to set him up with a primary care physician while he was in the hospital.  I was unable to focus him on anything else.  He stated he was not feeling too good.  Had some pain in his right flank.  Physical Exam: Vitals:   10/13/23 0426 10/13/23 0718 10/13/23 0818 10/13/23 1249  BP: 122/87  107/79 (!) 101/56  Pulse: 81  79 75  Resp: 18  20   Temp: 97.6 F (36.4 C)  97.6 F (36.4 C) 98.2 F (36.8 C)  TempSrc:    Oral  SpO2: 99% 99% 99% 96%  Weight:      Height:       Physical Exam HENT:     Head: Normocephalic.     Mouth/Throat:     Pharynx: No oropharyngeal exudate.  Eyes:     General: Lids are normal.     Conjunctiva/sclera: Conjunctivae normal.  Cardiovascular:     Rate and Rhythm: Normal rate and regular rhythm.     Heart sounds: Normal heart sounds, S1 normal and  S2 normal.  Pulmonary:     Breath sounds: Examination of the right-lower field reveals decreased breath sounds and rhonchi. Examination of the left-lower field reveals decreased breath sounds and rhonchi. Decreased breath sounds and rhonchi present. No wheezing or rales.  Abdominal:     Palpations: Abdomen is soft.     Tenderness: There is abdominal tenderness in the right lower quadrant.  Musculoskeletal:     Right lower leg: Swelling present.     Left lower leg: Swelling present.  Skin:    General: Skin is warm.      Findings: No rash.  Neurological:     Mental Status: He is alert and oriented to person, place, and time.     Data Reviewed: Creatinine 1.57 with a BUN of 52, hemoglobin 9.7 Echocardiogram showing an EF of 25 to 30% with moderate mitral valve regurgitation  Family Communication: Updated son on the phone  Disposition: Status is: Inpatient Remains inpatient appropriate because: Will continue IV Solu-Medrol.  Start Flomax and as needed Ditropan  Planned Discharge Destination: Home    Time spent: 28 minutes  Author: Alford Highland, MD 10/13/2023 1:07 PM  For on call review [email or web address].

## 2023-10-13 NOTE — Progress Notes (Signed)
Urology Consult Follow Up  Subjective: Patient resting comfortably receiving a breathing treatment.  He is complaining of right lower quadrant discomfort.  VSS afebrile  Morning labs are still pending.  Urine culture negative.  Anti-infectives: Anti-infectives (From admission, onward)    Start     Dose/Rate Route Frequency Ordered Stop   10/10/23 2345  cefTRIAXone (ROCEPHIN) 2 g in sodium chloride 0.9 % 100 mL IVPB        2 g 200 mL/hr over 30 Minutes Intravenous Every 24 hours 10/10/23 2341         Current Facility-Administered Medications  Medication Dose Route Frequency Provider Last Rate Last Admin   acetaminophen (TYLENOL) tablet 650 mg  650 mg Oral Q6H PRN Lorretta Harp, MD       albuterol (PROVENTIL) (2.5 MG/3ML) 0.083% nebulizer solution 3 mL  3 mL Nebulization Q4H PRN Lorretta Harp, MD       ALPRAZolam Prudy Feeler) tablet 0.5 mg  0.5 mg Oral QHS PRN Lorretta Harp, MD   0.5 mg at 10/12/23 2231   aspirin EC tablet 81 mg  81 mg Oral Daily Lorretta Harp, MD   81 mg at 10/11/23 0920   atorvastatin (LIPITOR) tablet 40 mg  40 mg Oral Daily Lorretta Harp, MD   40 mg at 10/11/23 0920   budesonide (PULMICORT) nebulizer solution 0.25 mg  0.25 mg Nebulization BID Alford Highland, MD   0.25 mg at 10/13/23 0716   cefTRIAXone (ROCEPHIN) 2 g in sodium chloride 0.9 % 100 mL IVPB  2 g Intravenous Q24H Lorretta Harp, MD 200 mL/hr at 10/13/23 0038 2 g at 10/13/23 0038   dextromethorphan-guaiFENesin (MUCINEX DM) 30-600 MG per 12 hr tablet 1 tablet  1 tablet Oral BID PRN Lorretta Harp, MD   1 tablet at 10/12/23 2016   hydrALAZINE (APRESOLINE) injection 5 mg  5 mg Intravenous Q2H PRN Lorretta Harp, MD       ipratropium-albuterol (DUONEB) 0.5-2.5 (3) MG/3ML nebulizer solution 3 mL  3 mL Nebulization BID Alford Highland, MD   3 mL at 10/13/23 0716   methylPREDNISolone sodium succinate (SOLU-MEDROL) 40 mg/mL injection 40 mg  40 mg Intravenous Daily Alford Highland, MD   40 mg at 10/12/23 0835   metoprolol succinate  (TOPROL-XL) 24 hr tablet 25 mg  25 mg Oral Daily Lorretta Harp, MD   25 mg at 10/12/23 0835   morphine (PF) 2 MG/ML injection 2 mg  2 mg Intravenous Q4H PRN Lorretta Harp, MD   2 mg at 10/13/23 0533   nitroGLYCERIN (NITROSTAT) SL tablet 0.4 mg  0.4 mg Sublingual Q5 min PRN Lorretta Harp, MD       ondansetron [facility]) injection 4 mg  4 mg Intravenous Q8H PRN Lorretta Harp, MD   4 mg at 10/11/23 4098   oxyCODONE-acetaminophen (PERCOCET/ROXICET) 5-325 MG per tablet 1 tablet  1 tablet Oral Q4H PRN Lorretta Harp, MD   1 tablet at 10/12/23 2120   spironolactone (ALDACTONE) tablet 25 mg  25 mg Oral Daily Lorretta Harp, MD   25 mg at 10/11/23 0920     Objective: Vital signs in last 24 hours: Temp:  [96.9 F (36.1 C)-97.9 F (36.6 C)] 97.6 F (36.4 C) (11/22 0426) Pulse Rate:  [62-93] 81 (11/22 0426) Resp:  [13-21] 18 (11/22 0426) BP: (105-131)/(67-87) 122/87 (11/22 0426) SpO2:  [95 %-100 %] 99 % (11/22 0718) Weight:  [86.9 kg-87 kg] 86.9 kg (11/22 0150)  Intake/Output from previous day: 11/21 0701 - 11/22 0700 In: 56.7 [I.V.:56.7] Out: 550 [  Urine:550] Intake/Output this shift: No intake/output data recorded.   Physical Exam Vitals and nursing note reviewed.  Constitutional:      General: He is not in acute distress.    Appearance: He is normal weight. He is not ill-appearing, toxic-appearing or diaphoretic.  HENT:     Head: Normocephalic and atraumatic.  Eyes:     Extraocular Movements: Extraocular movements intact.     Pupils: Pupils are equal, round, and reactive to light.  Pulmonary:     Effort: Pulmonary effort is normal.  Abdominal:     Palpations: Abdomen is soft.  Musculoskeletal:     Cervical back: Normal range of motion.  Skin:    General: Skin is warm.  Neurological:     General: No focal deficit present.     Mental Status: He is alert and oriented to person, place, and time.  Psychiatric:        Mood and Affect: Mood normal.        Thought Content: Thought content normal.      Lab Results:  Recent Labs    10/10/23 1645 10/12/23 0327  WBC 7.7 6.2  HGB 10.4* 9.3*  HCT 32.6* 29.1*  PLT 214 191   BMET Recent Labs    10/11/23 0610 10/12/23 0327  NA 140 141  K 4.7 4.7  CL 107 104  CO2 25 26  GLUCOSE 142* 171*  BUN 39* 49*  CREATININE 1.53* 1.50*  CALCIUM 9.2 9.1   PT/INR Recent Labs    10/10/23 2233  LABPROT 14.8  INR 1.1   ABG No results for input(s): "PHART", "HCO3" in the last 72 hours.  Invalid input(s): "PCO2", "PO2"  Studies/Results: DG OR UROLOGY CYSTO IMAGE ([facility] ONLY)  Result Date: 10/12/2023 There is no interpretation for this exam.  This order is for images obtained during a surgical procedure.  Please See "Surgeries" Tab for more information regarding the procedure.     Assessment: 67 year old male with bilateral staghorn calculi with a left ureteral chronic stone who was admitted after part of his right staghorn calculus migrated into his mid right ureter was causing intractable pain.  He underwent right ureteral stent placement yesterday with Dr. Lonna Cobb.  Cultures are negative   Plan: -Discussed with patient's the importance of keeping his follow-up appointment at [facility] for further management of his stones.  I explained that because of his complex presentation he would be best served at a tertiary care center like [facility] and that we have little to offer here in regards to his stone management. -Advised him that his appointment with [facility] urology is on January 3 -Recommend tamsulosin 0.4 mg daily and oxybutynin IR 5 mg 3 times daily as needed for stent discomfort     LOS: 3 days    [facility] 10/13/2023

## 2023-10-13 NOTE — Progress Notes (Signed)
Heart Failure Stewardship Pharmacy Note  PCP: Pcp, No PCP-Cardiologist: Julien Nordmann, MD  HPI: Ronnie Mann is a 67 y.o. male with taghorn calculus, left hydronephrosis, CHF with EF 30-35%, HTN, HLD, CAD with NSTEMI s/p DES, CKD-3a, anemia, hiatal hernia, obesity who presented with shortness of breath, hematuria, and flank pain. Reports chest pain with coughing and left arm numbness, however reports that arm pain has been occurring for some time. Has experienced mild hematuria over the past few weeks, but safely worsened 2 days before admission. Cough and yellow sputum production has also been ongiong for several weeks, but shortness of brath acutely worsened 2 days ago after the severe onset of hematuria and flank pain. Troponin on admission was 33. BNP on admission was 975.7. CXR consistent with congestive HF and developing interstitial edema. CT renal study showed moderate right hydronephrosis with stranding representative of infection. Ureteral stenting performed 10/12/23.  Pertinent cardiac history: NSTEMI in 04/2023 s/p PCI to proximal LAD and RCA on 05/01/23. Echo at that time showed LVEF 30-35% with grade II diastolic dysfunction, moderate MR.  Pertinent Lab Values: Creatinine, Ser  Date Value Ref Range Status  10/12/2023 1.50 (H) 0.61 - 1.24 mg/dL Final   BUN  Date Value Ref Range Status  10/12/2023 49 (H) 8 - 23 mg/dL Final  54/07/8118 26 8 - 27 mg/dL Final   Potassium  Date Value Ref Range Status  10/12/2023 4.7 3.5 - 5.1 mmol/L Final   Sodium  Date Value Ref Range Status  10/12/2023 141 135 - 145 mmol/L Final  08/30/2023 136 134 - 144 mmol/L Final   B Natriuretic Peptide  Date Value Ref Range Status  10/10/2023 975.7 (H) 0.0 - 100.0 pg/mL Final    Comment:    Performed at [facility], [address] [place], [place] [address]   Magnesium  Date Value Ref Range Status  10/11/2023 2.0 1.7 - 2.4 mg/dL Final    Comment:    Performed at [facility], [address] [place], [place] [address]   Hgb A1c MFr Bld  Date Value Ref Range Status  10/10/2023 6.1 (H) 4.8 - 5.6 % Final    Comment:    (NOTE) Pre diabetes:          5.7%-6.4%  Diabetes:              >6.4%  Glycemic control for   <7.0% adults with diabetes    TSH  Date Value Ref Range Status  04/28/2023 4.483 0.350 - 4.500 uIU/mL Final    Comment:    Performed by a 3rd Generation assay with a functional sensitivity of <=0.01 uIU/mL. Performed at [facility], [address] [place], [place] [address]    Vital Signs: Admission weight: 212 lbs Temp:  [96.9 F (36.1 C)-97.9 F (36.6 C)] 97.6 F (36.4 C) (11/22 0426) Pulse Rate:  [62-93] 81 (11/22 0426) Cardiac Rhythm: Sinus tachycardia;Bundle branch block (11/21 1903) Resp:  [13-21] 18 (11/22 0426) BP: (105-131)/(67-87) 122/87 (11/22 0426) SpO2:  [95 %-100 %] 99 % (11/22 0718) Weight:  [86.9 kg (191 lb 9.3 oz)-87 kg (191 lb 11.2 oz)] 86.9 kg (191 lb 9.3 oz) (11/22 0150)  Intake/Output Summary (Last 24 hours) at 10/13/2023 0735 Last data filed at 10/13/2023 0426 Gross per 24 hour  Intake 56.67 ml  Output 550 ml  Net -493.33 ml    Current Heart Failure Medications:  Loop diuretic: none Beta-Blocker: metoprolol succinate 25 mg daily ACEI/ARB/ARNI: none MRA: spironolactone 25  mg daily SGLT2i: none  Prior to admission Heart Failure Medications:  Loop diuretic: none Beta-Blocker: metoprolol succinate 25 mg bid ACEI/ARB/ARNI: Entresto 24-26 mg bid MRA: spironolactone 25 mg bid SGLT2i: none  Assessment: 1. Acute on chronic combined systolic and diastolic heart failure (LVEF 30-35%) with grade II diastolic dysfunction, due to ICM. NYHA class III symptoms.  -Symptoms: Reports shortness of breath has improved with nebs.  -Volume: Volume is difficult to assess. Creatinine/BUN still trending up. Not on oral diuretics at home. -Hemodynamics: BP improved this AM. HR 70-80s.  -BB: On metoprolol  succinate 25 mg daily. -ACEI/ARB/ARNI: Held due to labile BP. Verified with urology team that ARBs are fine with uretal stenting. Can consider restart prior to discharge if BP stable, but would not recommend at this time. -MRA: On spironolactone 25 mg daily. -SGLT2i: Not a candidate for this class given chronic uretal obstruction and UTIs.   Plan: 1) Medication changes recommended at this time: -Can resume Entresto on discharge if BP remains stable  2) Patient assistance: -Pending  3) Education: - Patient has been educated on current HF medications and potential additions to HF medication regimen - Patient verbalizes understanding that over the next few months, these medication doses may change and more medications may be added to optimize HF regimen - Patient has been educated on basic disease state pathophysiology and goals of therapy  Medication Assistance / Insurance Benefits Check: Does the patient have prescription insurance?    Type of insurance plan:  Does the patient qualify for medication assistance through manufacturers or grants? Pending   Outpatient Pharmacy: Prior to admission outpatient pharmacy: [facility]      Please do not hesitate to reach out with questions or concerns,  Enos Fling, PharmD, CPP, BCPS Heart Failure Pharmacist  Phone - [phone] 10/13/2023 7:35 AM

## 2023-10-14 DIAGNOSIS — I5022 Chronic systolic (congestive) heart failure: Secondary | ICD-10-CM | POA: Diagnosis not present

## 2023-10-14 DIAGNOSIS — E7849 Other hyperlipidemia: Secondary | ICD-10-CM

## 2023-10-14 DIAGNOSIS — J209 Acute bronchitis, unspecified: Secondary | ICD-10-CM | POA: Diagnosis not present

## 2023-10-14 DIAGNOSIS — N200 Calculus of kidney: Secondary | ICD-10-CM | POA: Diagnosis not present

## 2023-10-14 DIAGNOSIS — I2510 Atherosclerotic heart disease of native coronary artery without angina pectoris: Secondary | ICD-10-CM | POA: Diagnosis not present

## 2023-10-14 LAB — BASIC METABOLIC PANEL
Anion gap: 8 (ref 5–15)
BUN: 44 mg/dL — ABNORMAL HIGH (ref 8–23)
CO2: 27 mmol/L (ref 22–32)
Calcium: 8.7 mg/dL — ABNORMAL LOW (ref 8.9–10.3)
Chloride: 101 mmol/L (ref 98–111)
Creatinine, Ser: 1.33 mg/dL — ABNORMAL HIGH (ref 0.61–1.24)
GFR, Estimated: 59 mL/min — ABNORMAL LOW (ref >60)
Glucose, Bld: 127 mg/dL — ABNORMAL HIGH (ref 70–99)
Potassium: 4 mmol/L (ref 3.5–5.1)
Sodium: 136 mmol/L (ref 135–145)

## 2023-10-14 LAB — HEMOGLOBIN: Hemoglobin: 9 g/dL — ABNORMAL LOW (ref 13.0–17.0)

## 2023-10-14 MED ORDER — OXYBUTYNIN CHLORIDE 5 MG PO TABS: 5.0000 mg | ORAL_TABLET | Freq: Three times a day (TID) | ORAL | 0 refills | Status: DC | PRN

## 2023-10-14 MED ORDER — CEPHALEXIN 500 MG PO CAPS: 500.0000 mg | ORAL_CAPSULE | Freq: Three times a day (TID) | ORAL | 0 refills | Status: AC

## 2023-10-14 MED ORDER — ATORVASTATIN CALCIUM 40 MG PO TABS: 40.0000 mg | ORAL_TABLET | Freq: Every day | ORAL | 0 refills | Status: DC

## 2023-10-14 MED ORDER — FUROSEMIDE 20 MG PO TABS: 20.0000 mg | ORAL_TABLET | Freq: Every day | ORAL | 0 refills | Status: DC

## 2023-10-14 MED ORDER — ALBUTEROL SULFATE HFA 108 (90 BASE) MCG/ACT IN AERS: 2.0000 | INHALATION_SPRAY | Freq: Four times a day (QID) | RESPIRATORY_TRACT | 0 refills | Status: DC | PRN

## 2023-10-14 MED ORDER — OXYCODONE-ACETAMINOPHEN 5-325 MG PO TABS: 1.0000 | ORAL_TABLET | Freq: Four times a day (QID) | ORAL | 0 refills | Status: DC | PRN

## 2023-10-14 MED ORDER — PREDNISONE 10 MG PO TABS: ORAL_TABLET | ORAL | 0 refills | Status: DC

## 2023-10-14 MED ORDER — TAMSULOSIN HCL 0.4 MG PO CAPS: 0.4000 mg | ORAL_CAPSULE | Freq: Every day | ORAL | 0 refills | Status: DC

## 2023-10-14 MED ORDER — POLYETHYLENE GLYCOL 3350 17 G PO PACK: 17.0000 g | PACK | Freq: Every day | ORAL | 0 refills | Status: DC

## 2023-10-14 NOTE — TOC Transition Note (Signed)
Transition of Care [facility]) - CM/SW Discharge Note   Patient Details  Name: Ronnie Mann MRN: [id number] Date of Birth: [birth year]  [facility] CM/SW Contact:  Bing Quarry, RN Phone Number: 10/14/2023, 12:54 PM   Clinical Narrative:  11/23: Discharge orders in for today to home/self care. PCP options list was already added to AVS. Patient has [employer], patient can also call or go online at [email or web address] to choose a PCP in network with his plan.   Ambulatory referral to HF clinic order in.   Gabriel Cirri MSN RN CM  Care Management Department.  [place]  [facility] Direct Dial: [phone] Main Office Phone: [phone] Weekends Only         Final next level of care: Home/Self Care Barriers to Discharge: Barriers Resolved   Patient Goals and CMS Choice      Discharge Placement                         Discharge Plan and Services Additional resources added to the After Visit Summary for                  DME Arranged: N/A DME Agency: NA       HH Arranged: NA HH Agency: NA        Social Determinants of Health (SDOH) Interventions SDOH Screenings   Food Insecurity: No Food Insecurity (10/12/2023)  Housing: Low Risk  (10/12/2023)  Recent Concern: Housing - Medium Risk (10/11/2023)  Transportation Needs: No Transportation Needs (10/12/2023)  Utilities: Not At Risk (10/11/2023)  Financial Resource Strain: Low Risk  (10/12/2023)  Tobacco Use: Low Risk  (10/10/2023)     Readmission Risk Interventions     No data to display

## 2023-10-14 NOTE — Discharge Summary (Signed)
Physician Discharge Summary   Patient: Ronnie Mann MRN: [id number] DOB: March 04, 1956  Admit date:     10/10/2023  Discharge date: 10/14/23  Discharge Physician: Alford Highland   PCP: Pcp, No   Recommendations at discharge:   Patient will have to call [employer] to get a PCP assigned to him Patient will follow-up with urology on January 3 at [facility] Follow-up CHF clinic  Discharge Diagnoses: Principal Problem:   Acute bronchitis Active Problems:   Chronic systolic CHF (congestive heart failure) (HCC)   Right nephrolithiasis   Myocardial injury   CAD (coronary artery disease)   Essential hypertension   Chronic kidney disease, stage 3b (HCC)   Hydronephrosis   Complicated UTI (urinary tract infection)   Obesity (BMI 30-39.9)   HLD (hyperlipidemia)    Hospital Course: 67 year old man past medical history of staghorn calculus and left hydronephrosis, CHF with EF of 30 to 35%, hypertension, hyperlipidemia, CAD, CKD stage IIIa, anemia, hiatal hernia, obesity presented with shortness of breath hematuria and flank pain.  Patient was started on IV Lasix for CHF exacerbation.  Patient complains he has a cold.  Patient also urinating some blood.  11/20.  Cardiology believes this is likely more bronchitis rather than CHF.  Will hold Lasix after this afternoon's dose.  I will send off a viral respiratory panel.  Give a dose of Solu-Medrol. 11/21.  Viral respiratory panel negative.  Patient breathing little bit better this morning.  Stent placement by urology today. 11/22.  Patient was very upset about the insurance company not being able to help him with setting up a primary care physician, it was hard to focus him on anything else today. 11/23.  Okay to go back on [employer].  Patient feeling better and wants to go home.  Breathing better.  Able to come off oxygen today.  Assessment and Plan: * Acute bronchitis Patient was given nebulizer treatments and Solu-Medrol here.  Will give a  prednisone taper and albuterol inhaler to go home with.  Viral respiratory panel negative COVID test and influenza negative.  Chronic systolic CHF (congestive heart failure) [facility]) Cardiology believes this is more like acute bronchitis rather than acute on chronic systolic congestive heart failure.  Continue spironolactone and Toprol.  EF 25 to 30% with moderate mitral regurgitation.  Can go back on Entresto and low-dose Lasix as outpatient.  Follow-up at CHF clinic.  Right nephrolithiasis Obstructing stone and right hydronephrosis.  Urology placed a right ureteral stent on 11/21.  Urology will not place a stent on the left side secondary to chronic left-sided chronic hydronephrosis.  They recommend the stent stay in until seen on January 3 by urology at [facility].  CAD (coronary artery disease) Patient on aspirin and Toprol  Myocardial injury Troponin borderline at 29.  This is not a myocardial infarction.  Essential hypertension Continue spironolactone and Toprol.  Can go back on Entresto as outpatient.  Chronic kidney disease, stage 3b (HCC) Creatinine improved to 1.33.  Complicated UTI (urinary tract infection) Both urine culture showed no growth.  With a history of stone I will give Keflex for few more days upon discharge.  Obesity (BMI 30-39.9) BMI 28.29 with current height and weight in computer  HLD (hyperlipidemia) On Lipitor         Consultants: Cardiology, urology Procedures performed: Stent right ureter Disposition: Home Diet recommendation:  Cardiac diet DISCHARGE MEDICATION: Allergies as of 10/14/2023       Reactions   Losartan Other (See Comments)   Chest  pain        Medication List     STOP taking these medications    ALPRAZolam 0.5 MG tablet Commonly known as: Xanax   cefadroxil 500 MG capsule Commonly known as: DURICEF   HYDROcodone-acetaminophen 5-325 MG tablet Commonly known as: NORCO/VICODIN   ticagrelor 90 MG Tabs tablet Commonly known  as: BRILINTA       TAKE these medications    albuterol 108 (90 Base) MCG/ACT inhaler Commonly known as: VENTOLIN HFA Inhale 2 puffs into the lungs every 6 (six) hours as needed for wheezing or shortness of breath.   aspirin EC 81 MG tablet Take 81 mg by mouth daily. Swallow whole.   atorvastatin 40 MG tablet Commonly known as: LIPITOR Take 1 tablet (40 mg total) by mouth daily.   cephALEXin 500 MG capsule Commonly known as: KEFLEX Take 1 capsule (500 mg total) by mouth 3 (three) times daily for 4 days.   Entresto 24-26 MG Generic drug: sacubitril-valsartan Take 1 tablet by mouth 2 (two) times daily.   furosemide 20 MG tablet Commonly known as: Lasix Take 1 tablet (20 mg total) by mouth daily.   metoprolol succinate 25 MG 24 hr tablet Commonly known as: TOPROL-XL Take 1 tablet (25 mg total) by mouth daily.   oxybutynin 5 MG tablet Commonly known as: DITROPAN Take 1 tablet (5 mg total) by mouth every 8 (eight) hours as needed for bladder spasms.   oxyCODONE-acetaminophen 5-325 MG tablet Commonly known as: PERCOCET/ROXICET Take 1 tablet by mouth every 6 (six) hours as needed for severe pain (pain score 7-10).   polyethylene glycol 17 g packet Commonly known as: MIRALAX / GLYCOLAX Take 17 g by mouth daily. What changed:  when to take this reasons to take this   predniSONE 10 MG tablet Commonly known as: DELTASONE 3 tabs po day 1; 2 tabs po day 2; 1 tab po day 3,4; 1/2 tab po day 5,6   spironolactone 25 MG tablet Commonly known as: ALDACTONE Take 1 tablet (25 mg total) by mouth daily.   tamsulosin 0.4 MG Caps capsule Commonly known as: FLOMAX Take 1 capsule (0.4 mg total) by mouth daily after supper.        Follow-up Information     [facility] Follow up in 10 day(s).   Specialty: Cardiology Why: Hospital follow-up 10/23/23 @ 8:30 am Please bring all medications to follow-up appointment.   [facility], [address] Contact information: [address] [place] [address] [phone]        Call [employer] to get appointment with PCP Follow up.          keep appointment with [facility] on 11/24/23 Follow up.                 Discharge Exam: Filed Weights   10/13/23 0150 10/14/23 0359 10/14/23 0815  Weight: 86.9 kg 86.6 kg 86.6 kg   Physical Exam HENT:     Head: Normocephalic.     Mouth/Throat:     Pharynx: No oropharyngeal exudate.  Eyes:     General: Lids are normal.     Conjunctiva/sclera: Conjunctivae normal.  Cardiovascular:     Rate and Rhythm: Normal rate and regular rhythm.     Heart sounds: Normal heart sounds, S1 normal and S2 normal.  Pulmonary:     Breath sounds: Examination of the right-lower field reveals decreased breath sounds. Examination of the left-lower field  reveals decreased breath sounds. Decreased breath sounds present. No wheezing, rhonchi or rales.  Abdominal:     Palpations: Abdomen is soft.     Tenderness: There is abdominal tenderness in the right lower quadrant.  Musculoskeletal:     Right lower leg: Swelling present.     Left lower leg: Swelling present.  Skin:    General: Skin is warm.     Findings: No rash.  Neurological:     Mental Status: He is alert and oriented to person, place, and time.      Condition at discharge: stable  The results of significant diagnostics from this hospitalization (including imaging, microbiology, ancillary and laboratory) are listed below for reference.   Imaging Studies: ECHOCARDIOGRAM LIMITED  Result Date: 10/13/2023    ECHOCARDIOGRAM LIMITED REPORT   Patient Name:   Ronnie Mann Date of Exam: 10/13/2023 Medical Rec #:  [id number]    Height:       69.0 in Accession #:    [id number]   Weight:       191.6 lb Date of Birth:  04/23/1956    BSA:          2.029 m Patient Age:    67 years     BP:           107/79 mmHg Patient Gender: M            HR:           80 bpm.  Exam Location:  [facility] Procedure: Limited Echo, Limited Color Doppler and 3D Echo Indications:     CHF  History:         Patient has prior history of Echocardiogram examinations, most                  recent 04/30/2023. CHF and Cardiomyopathy, CAD and Previous                  Myocardial Infarction, Signs/Symptoms:Shortness of Breath; Risk                  Factors:Hypertension, Diabetes and Dyslipidemia. CKD.  Sonographer:     Mikki Harbor Referring Phys:  [id number] SHERI HAMMOCK Diagnosing Phys: Debbe Odea MD IMPRESSIONS  1. Left ventricular ejection fraction, by estimation, is 25 to 30%. The left ventricle has severely decreased function. The left ventricular internal cavity size was mildly dilated.  2. Right ventricular systolic function is low normal. The right ventricular size is normal. There is normal pulmonary artery systolic pressure.  3. Left atrial size was moderately dilated.  4. The mitral valve is normal in structure. Moderate mitral valve regurgitation.  5. The aortic valve is tricuspid. Aortic valve regurgitation is not visualized.  6. Aortic dilatation noted. There is mild dilatation of the aortic root, measuring 40 mm.  7. The inferior vena cava is normal in size with greater than 50% respiratory variability, suggesting right atrial pressure of 3 mmHg. FINDINGS  Left Ventricle: Left ventricular ejection fraction, by estimation, is 25 to 30%. The left ventricle has severely decreased function. The left ventricular internal cavity size was mildly dilated. There is no left ventricular hypertrophy. Right Ventricle: The right ventricular size is normal. No increase in right ventricular wall thickness. Right ventricular systolic function is low normal. There is normal pulmonary artery systolic pressure. The tricuspid regurgitant velocity is 2.23 m/s,  and with an assumed right atrial pressure of 3 mmHg, the estimated right ventricular systolic pressure is 22.9 mmHg. Left Atrium: Left  atrial size was  moderately dilated. Right Atrium: Right atrial size was normal in size. Pericardium: There is no evidence of pericardial effusion. Mitral Valve: The mitral valve is normal in structure. Moderate mitral valve regurgitation. Tricuspid Valve: The tricuspid valve is normal in structure. Tricuspid valve regurgitation is not demonstrated. Aortic Valve: The aortic valve is tricuspid. Aortic valve regurgitation is not visualized. Aorta: Aortic dilatation noted. There is mild dilatation of the aortic root, measuring 40 mm. Venous: The inferior vena cava is normal in size with greater than 50% respiratory variability, suggesting right atrial pressure of 3 mmHg. Additional Comments: Color Doppler performed.  LEFT VENTRICLE PLAX 2D LVIDd:         6.00 cm LVIDs:         5.10 cm LV PW:         1.00 cm LV IVS:        1.00 cm LVOT diam:     2.10 cm LVOT Area:     3.46 cm  RIGHT VENTRICLE RV Basal diam:  3.35 cm RV Mid diam:    3.40 cm LEFT ATRIUM              Index        RIGHT ATRIUM           Index LA diam:        4.30 cm  2.12 cm/m   RA Area:     20.00 cm LA Vol (A2C):   119.0 ml 58.66 ml/m  RA Volume:   58.40 ml  28.79 ml/m LA Vol (A4C):   83.6 ml  41.21 ml/m LA Biplane Vol: 103.0 ml 50.77 ml/m   AORTA Ao Root diam: 4.00 cm Ao Asc diam:  3.70 cm MR Peak grad:    86.9 mmHg    TRICUSPID VALVE MR Mean grad:    56.0 mmHg    TR Peak grad:   19.9 mmHg MR Vmax:         466.00 cm/s  TR Vmax:        223.00 cm/s MR Vmean:        347.0 cm/s MR PISA:         2.26 cm     SHUNTS MR PISA Eff ROA: 45 mm       Systemic Diam: 2.10 cm MR PISA Radius:  0.60 cm Debbe Odea MD Electronically signed by Debbe Odea MD Signature Date/Time: 10/13/2023/11:02:35 AM    Final    DG OR UROLOGY CYSTO IMAGE ([facility] ONLY)  Result Date: 10/12/2023 There is no interpretation for this exam.  This order is for images obtained during a surgical procedure.  Please See "Surgeries" Tab for more information regarding the procedure.   CT RENAL  STONE STUDY  Result Date: 10/10/2023 CLINICAL DATA:  Bilateral flank pain right greater than left EXAM: CT ABDOMEN AND PELVIS WITHOUT CONTRAST TECHNIQUE: Multidetector CT imaging of the abdomen and pelvis was performed following the standard protocol without IV contrast. RADIATION DOSE REDUCTION: This exam was performed according to the departmental dose-optimization program which includes automated exposure control, adjustment of the mA and/or kV according to patient size and/or use of iterative reconstruction technique. COMPARISON:  05/21/2023 FINDINGS: Lower chest: Trace bilateral pleural effusions. Dependent areas of consolidation within the lungs consistent with atelectasis. Large hiatal hernia. Hepatobiliary: Unremarkable unenhanced appearance of the liver. Multiple large calcified gallstones are identified. No evidence of acute cholecystitis. Pancreas: Unremarkable. No pancreatic ductal dilatation or surrounding inflammatory changes. Spleen: Normal in size without  focal abnormality. Adrenals/Urinary Tract: There is significant right-sided hydronephrosis and hydroureter, due to an obstructing 14 mm mid right ureteral calculus reference image 62/2. There is a 27 mm staghorn type calculus at the right UPJ. Nonobstructing 44 mm staghorn calculus seen within the upper pole right kidney. There is stranding of the renal sinus fat surrounding the right renal pelvis, and superimposed infection cannot be excluded. Chronic obstructing proximal left ureteral calculus is identified, measuring 13 mm in maximal dimension. Mild chronic left hydronephrosis and hydroureter with severe left renal cortical thinning. Other nonobstructing left renal calculi are seen, with largest staghorn type calculus in the lower pole measuring 23 mm. Benign renal cortical cysts are again identified, do not require specific imaging follow-up. Stable bilateral adrenal adenomas. Bladder is unremarkable. Stomach/Bowel: No bowel obstruction or  ileus. Normal appendix right lower quadrant. No bowel wall thickening or inflammatory change. Vascular/Lymphatic: Aortic atherosclerosis. No enlarged abdominal or pelvic lymph nodes. Reproductive: Prostate is unremarkable. Other: No free fluid or free intraperitoneal gas. No abdominal wall hernia. Musculoskeletal: No acute or destructive bony abnormalities. Reconstructed images demonstrate no additional findings. IMPRESSION: 1. Moderate right-sided hydronephrosis due to an obstructing 14 mm mid right ureteral calculus. The 27 mm staghorn type calculus at the right UPJ may also contribute to the obstructive uropathy. Stranding of the renal sinus fat could reflect superimposed infection. 2. Stable chronic obstruction stable chronic 13 mm obstructing mid left ureteral calculus, with persistent severe hydronephrosis and marked renal cortical thinning. 3. Other bilateral nonobstructing staghorn type renal calculi as above. 4. Cholelithiasis without cholecystitis. 5. Trace bilateral pleural effusions, with bilateral lower lobe atelectasis. 6. Large hiatal hernia. 7.  Aortic Atherosclerosis (ICD10-I70.0). Electronically Signed   By: Sharlet Salina M.D.   On: 10/10/2023 21:17   DG Chest Portable 1 View  Result Date: 10/10/2023 CLINICAL DATA:  Short of breath, chest pain for 2 days radiating down left arm EXAM: PORTABLE CHEST 1 VIEW COMPARISON:  05/16/2023 FINDINGS: 2 frontal views of the chest demonstrates stable enlargement of the cardiac silhouette. There is vascular congestion, with diffuse bilateral interstitial prominence consistent with developing edema. No effusion or pneumothorax. Stable hiatal hernia. IMPRESSION: 1. Findings consistent with congestive heart failure and developing interstitial edema. 2. Hiatal hernia. Electronically Signed   By: Sharlet Salina M.D.   On: 10/10/2023 21:09    Microbiology: Results for orders placed or performed during the hospital encounter of 10/10/23  Resp panel by RT-PCR  (RSV, Flu A&B, Covid) Anterior Nasal Swab     Status: None   Collection Time: 10/10/23 10:33 PM   Specimen: Anterior Nasal Swab  Result Value Ref Range Status   SARS Coronavirus 2 by RT PCR NEGATIVE NEGATIVE Final    Comment: (NOTE) SARS-CoV-2 target nucleic acids are NOT DETECTED.  The SARS-CoV-2 RNA is generally detectable in upper respiratory specimens during the acute phase of infection. The lowest concentration of SARS-CoV-2 viral copies this assay can detect is 138 copies/mL. A negative result does not preclude SARS-Cov-2 infection and should not be used as the sole basis for treatment or other patient management decisions. A negative result may occur with  improper specimen collection/handling, submission of specimen other than nasopharyngeal swab, presence of viral mutation(s) within the areas targeted by this assay, and inadequate number of viral copies(<138 copies/mL). A negative result must be combined with clinical observations, patient history, and epidemiological information. The expected result is Negative.  Fact Sheet for Patients:  BloggerCourse.com  Fact Sheet for Healthcare Providers:  SeriousBroker.it  This test is no t yet approved or cleared by the [place] and  has been authorized for detection and/or diagnosis of SARS-CoV-2 by FDA under an Emergency Use Authorization (EUA). This EUA will remain  in effect (meaning this test can be used) for the duration of the COVID-19 declaration under Section 564(b)(1) of the Act, 21 U.S.C.section 360bbb-3(b)(1), unless the authorization is terminated  or revoked sooner.       Influenza A by PCR NEGATIVE NEGATIVE Final   Influenza B by PCR NEGATIVE NEGATIVE Final    Comment: (NOTE) The Xpert Xpress SARS-CoV-2/FLU/RSV plus assay is intended as an aid in the diagnosis of influenza from Nasopharyngeal swab specimens and should not be used as a sole basis for  treatment. Nasal washings and aspirates are unacceptable for Xpert Xpress SARS-CoV-2/FLU/RSV testing.  Fact Sheet for Patients: BloggerCourse.com  Fact Sheet for Healthcare Providers: SeriousBroker.it  This test is not yet approved or cleared by the [place] FDA and has been authorized for detection and/or diagnosis of SARS-CoV-2 by FDA under an Emergency Use Authorization (EUA). This EUA will remain in effect (meaning this test can be used) for the duration of the COVID-19 declaration under Section 564(b)(1) of the Act, 21 U.S.C. section 360bbb-3(b)(1), unless the authorization is terminated or revoked.     Resp Syncytial Virus by PCR NEGATIVE NEGATIVE Final    Comment: (NOTE) Fact Sheet for Patients: BloggerCourse.com  Fact Sheet for Healthcare Providers: SeriousBroker.it  This test is not yet approved or cleared by the [place] FDA and has been authorized for detection and/or diagnosis of SARS-CoV-2 by FDA under an Emergency Use Authorization (EUA). This EUA will remain in effect (meaning this test can be used) for the duration of the COVID-19 declaration under Section 564(b)(1) of the Act, 21 U.S.C. section 360bbb-3(b)(1), unless the authorization is terminated or revoked.  Performed at [facility], [address] [place], [place] [address]   Urine Culture     Status: None   Collection Time: 10/10/23 10:33 PM   Specimen: Urine, Random  Result Value Ref Range Status   Specimen Description   Final    URINE, RANDOM Performed at [facility], [address] [place], [place] [address]    Special Requests   Final    NONE Performed at [facility], [address] [place], [place] [address]    Culture   Final    NO GROWTH Performed at [facility], [address], [place], [place] [address]    Report Status 10/12/2023  FINAL  Final  Respiratory (~20 pathogens) panel by PCR     Status: None   Collection Time: 10/11/23  7:55 AM   Specimen: Nasopharyngeal Swab; Respiratory  Result Value Ref Range Status   Adenovirus NOT DETECTED NOT DETECTED Final   Coronavirus 229E NOT DETECTED NOT DETECTED Final    Comment: (NOTE) The Coronavirus on the Respiratory Panel, DOES NOT test for the novel  Coronavirus (2019 nCoV)    Coronavirus HKU1 NOT DETECTED NOT DETECTED Final   Coronavirus NL63 NOT DETECTED NOT DETECTED Final   Coronavirus OC43 NOT DETECTED NOT DETECTED Final   Metapneumovirus NOT DETECTED NOT DETECTED Final   Rhinovirus / Enterovirus NOT DETECTED NOT DETECTED Final   Influenza A NOT DETECTED NOT DETECTED Final   Influenza B NOT DETECTED NOT DETECTED Final   Parainfluenza Virus 1 NOT DETECTED NOT DETECTED Final   Parainfluenza Virus 2 NOT DETECTED NOT DETECTED Final  Parainfluenza Virus 3 NOT DETECTED NOT DETECTED Final   Parainfluenza Virus 4 NOT DETECTED NOT DETECTED Final   Respiratory Syncytial Virus NOT DETECTED NOT DETECTED Final   Bordetella pertussis NOT DETECTED NOT DETECTED Final   Bordetella Parapertussis NOT DETECTED NOT DETECTED Final   Chlamydophila pneumoniae NOT DETECTED NOT DETECTED Final   Mycoplasma pneumoniae NOT DETECTED NOT DETECTED Final    Comment: Performed at [facility], [address], [place], [place] [address]  Body fluid culture w Gram Stain     Status: None (Preliminary result)   Collection Time: 10/12/23 12:40 PM   Specimen: Path fluid; Body Fluid  Result Value Ref Range Status   Specimen Description URINE, RANDOM  Final   Special Requests RIGHT KIDNEY  Final   Gram Stain   Final    FEW WBC PRESENT, PREDOMINANTLY PMN NO ORGANISMS SEEN    Culture   Final    NO GROWTH 2 DAYS Performed at [facility], [address], [place], [place] [address]    Report Status PENDING  Incomplete    Labs: CBC: Recent Labs  Lab 10/10/23 1645 10/12/23 0327  10/13/23 0736 10/14/23 0825  WBC 7.7 6.2 7.7  --   NEUTROABS 5.6  --   --   --   HGB 10.4* 9.3* 9.7* 9.0*  HCT 32.6* 29.1* 31.0*  --   MCV 90.8 90.7 92.3  --   PLT 214 191 196  --    Basic Metabolic Panel: Recent Labs  Lab 10/10/23 1645 10/11/23 0610 10/12/23 0327 10/13/23 0736 10/14/23 0825  NA 135 140 141 138 136  K 4.1 4.7 4.7 4.3 4.0  CL 106 107 104 102 101  CO2 22 25 26 25 27   GLUCOSE 145* 142* 171* 131* 127*  BUN 37* 39* 49* 52* 44*  CREATININE 1.33* 1.53* 1.50* 1.57* 1.33*  CALCIUM 9.0 9.2 9.1 8.8* 8.7*  MG 2.0 2.0  --   --   --    Liver Function Tests: Recent Labs  Lab 10/10/23 1645  AST 38  ALT 34  ALKPHOS 82  BILITOT 1.3*  PROT 7.9  ALBUMIN 4.4   CBG: Recent Labs  Lab 10/11/23 0739  GLUCAP 139*    Discharge time spent: greater than 30 minutes.  Signed: Alford Highland, MD [facility] 10/14/2023

## 2023-10-14 NOTE — Progress Notes (Signed)
This RN ambulated patient around the unit x2 times on Room Air. The patient's oxygen level maintained 97-99% on Room Air.

## 2023-10-14 NOTE — Plan of Care (Signed)
  Problem: Education: Goal: Knowledge of General Education information will improve Description: Including pain rating scale, medication(s)/side effects and non-pharmacologic comfort measures 10/14/2023 1142 by Ansel Bong, RN Outcome: Progressing 10/14/2023 1142 by Ansel Bong, RN Outcome: Progressing   Problem: Health Behavior/Discharge Planning: Goal: Ability to manage health-related needs will improve 10/14/2023 1142 by Ansel Bong, RN Outcome: Progressing 10/14/2023 1142 by Ansel Bong, RN Outcome: Progressing   Problem: Clinical Measurements: Goal: Ability to maintain clinical measurements within normal limits will improve 10/14/2023 1142 by Ansel Bong, RN Outcome: Progressing 10/14/2023 1142 by Ansel Bong, RN Outcome: Progressing Goal: Will remain free from infection 10/14/2023 1142 by Ansel Bong, RN Outcome: Progressing 10/14/2023 1142 by Ansel Bong, RN Outcome: Progressing Goal: Diagnostic test results will improve 10/14/2023 1142 by Ansel Bong, RN Outcome: Progressing 10/14/2023 1142 by Ansel Bong, RN Outcome: Progressing Goal: Respiratory complications will improve 10/14/2023 1142 by Ansel Bong, RN Outcome: Progressing 10/14/2023 1142 by Ansel Bong, RN Outcome: Progressing Goal: Cardiovascular complication will be avoided 10/14/2023 1142 by Ansel Bong, RN Outcome: Progressing 10/14/2023 1142 by Ansel Bong, RN Outcome: Progressing   Problem: Activity: Goal: Risk for activity intolerance will decrease 10/14/2023 1142 by Ansel Bong, RN Outcome: Progressing 10/14/2023 1142 by Ansel Bong, RN Outcome: Progressing   Problem: Nutrition: Goal: Adequate nutrition will be maintained 10/14/2023 1142 by Ansel Bong, RN Outcome: Progressing 10/14/2023 1142 by Ansel Bong, RN Outcome: Progressing   Problem: Coping: Goal: Level of anxiety will decrease 10/14/2023 1142 by Ansel Bong, RN Outcome:  Progressing 10/14/2023 1142 by Ansel Bong, RN Outcome: Progressing   Problem: Elimination: Goal: Will not experience complications related to bowel motility 10/14/2023 1142 by Ansel Bong, RN Outcome: Progressing 10/14/2023 1142 by Ansel Bong, RN Outcome: Progressing Goal: Will not experience complications related to urinary retention 10/14/2023 1142 by Ansel Bong, RN Outcome: Progressing 10/14/2023 1142 by Ansel Bong, RN Outcome: Progressing   Problem: Pain Management: Goal: General experience of comfort will improve 10/14/2023 1142 by Ansel Bong, RN Outcome: Progressing 10/14/2023 1142 by Ansel Bong, RN Outcome: Progressing   Problem: Safety: Goal: Ability to remain free from injury will improve 10/14/2023 1142 by Ansel Bong, RN Outcome: Progressing 10/14/2023 1142 by Ansel Bong, RN Outcome: Progressing   Problem: Skin Integrity: Goal: Risk for impaired skin integrity will decrease 10/14/2023 1142 by Ansel Bong, RN Outcome: Progressing 10/14/2023 1142 by Ansel Bong, RN Outcome: Progressing   Problem: Education: Goal: Ability to demonstrate management of disease process will improve 10/14/2023 1142 by Ansel Bong, RN Outcome: Progressing 10/14/2023 1142 by Ansel Bong, RN Outcome: Progressing Goal: Ability to verbalize understanding of medication therapies will improve 10/14/2023 1142 by Ansel Bong, RN Outcome: Progressing 10/14/2023 1142 by Ansel Bong, RN Outcome: Progressing Goal: Individualized Educational Video(s) 10/14/2023 1142 by Ansel Bong, RN Outcome: Progressing 10/14/2023 1142 by Ansel Bong, RN Outcome: Progressing   Problem: Activity: Goal: Capacity to carry out activities will improve 10/14/2023 1142 by Ansel Bong, RN Outcome: Progressing 10/14/2023 1142 by Ansel Bong, RN Outcome: Progressing   Problem: Cardiac: Goal: Ability to achieve and maintain adequate  cardiopulmonary perfusion will improve 10/14/2023 1142 by Ansel Bong, RN Outcome: Progressing 10/14/2023 1142 by Ansel Bong, RN Outcome: Progressing

## 2023-10-14 NOTE — Plan of Care (Signed)
  Problem: Education: Goal: Knowledge of General Education information will improve Description: Including pain rating scale, medication(s)/side effects and non-pharmacologic comfort measures Outcome: Progressing   Problem: Clinical Measurements: Goal: Ability to maintain clinical measurements within normal limits will improve Outcome: Progressing Goal: Respiratory complications will improve Outcome: Progressing Goal: Cardiovascular complication will be avoided Outcome: Progressing   Problem: Elimination: Goal: Will not experience complications related to bowel motility Outcome: Not Progressing

## 2023-10-16 LAB — BODY FLUID CULTURE W GRAM STAIN: Culture: NO GROWTH

## 2023-10-18 ENCOUNTER — Telehealth: Payer: Self-pay | Admitting: Family

## 2023-10-18 NOTE — Telephone Encounter (Signed)
Pt confirmed appt 10/23/23

## 2023-10-23 ENCOUNTER — Other Ambulatory Visit (HOSPITAL_COMMUNITY): Payer: Self-pay

## 2023-10-23 ENCOUNTER — Other Ambulatory Visit: Payer: Self-pay

## 2023-10-23 ENCOUNTER — Encounter: Payer: Self-pay | Admitting: Family

## 2023-10-23 ENCOUNTER — Encounter: Payer: Self-pay | Admitting: Physician Assistant

## 2023-10-23 ENCOUNTER — Ambulatory Visit
Admission: RE | Admit: 2023-10-23 | Discharge: 2023-10-23 | Disposition: A | Discharge status: Home / Self Care | Payer: Medicare HMO | Source: Ambulatory Visit | Attending: Physician Assistant | Admitting: Physician Assistant

## 2023-10-23 ENCOUNTER — Ambulatory Visit: Payer: Medicare HMO | Admitting: Physician Assistant

## 2023-10-23 ENCOUNTER — Ambulatory Visit (HOSPITAL_BASED_OUTPATIENT_CLINIC_OR_DEPARTMENT_OTHER): Payer: Medicare HMO | Admitting: Family

## 2023-10-23 VITALS — BP 126/71 | HR 98 | Temp 97.4°F | Ht 70.0 in | Wt 201.0 lb

## 2023-10-23 VITALS — BP 110/63 | HR 77 | Resp 20 | Wt 201.5 lb

## 2023-10-23 DIAGNOSIS — R109 Unspecified abdominal pain: Secondary | ICD-10-CM

## 2023-10-23 DIAGNOSIS — N200 Calculus of kidney: Secondary | ICD-10-CM

## 2023-10-23 DIAGNOSIS — I5022 Chronic systolic (congestive) heart failure: Secondary | ICD-10-CM | POA: Insufficient documentation

## 2023-10-23 DIAGNOSIS — I10 Essential (primary) hypertension: Secondary | ICD-10-CM | POA: Diagnosis not present

## 2023-10-23 DIAGNOSIS — I2510 Atherosclerotic heart disease of native coronary artery without angina pectoris: Secondary | ICD-10-CM

## 2023-10-23 LAB — URINALYSIS, COMPLETE
Bilirubin, UA: NEGATIVE
Ketones, UA: NEGATIVE
Nitrite, UA: NEGATIVE
Specific Gravity, UA: 1.02 (ref 1.005–1.030)
Urobilinogen, Ur: 1 mg/dL (ref 0.2–1.0)
pH, UA: 6.5 (ref 5.0–7.5)

## 2023-10-23 LAB — MICROSCOPIC EXAMINATION
Bacteria, UA: NONE SEEN
RBC, Urine: >30 /[HPF] — AB (ref 0–2)

## 2023-10-23 MED ORDER — GEMTESA 75 MG PO TABS: 75.0000 mg | ORAL_TABLET | Freq: Every day | ORAL | Status: DC

## 2023-10-23 MED ORDER — ENTRESTO 24-26 MG PO TABS
1.0000 | ORAL_TABLET | Freq: Two times a day (BID) | ORAL | 3 refills | Status: DC
  Filled 2023-10-23 (×2): qty 60, 30d supply, fill #0

## 2023-10-23 NOTE — Patient Instructions (Addendum)
[facility] an [facility] on your [facility] out today. CONTINUE Flomax (tamsulosin) once daily. STOP Ditropan (oxybutynin) and START Gemtesa (the samples I gave you) once daily instead. CONTINUE Miralax (polyethylene glycol) packets once daily. Stay well hydrated with 64oz of fluid daily.

## 2023-10-23 NOTE — Progress Notes (Signed)
10/23/2023 3:12 PM   [name] June 27, 1956 [id number]  CC: Chief Complaint  Patient presents with   Abdominal Pain   HPI: Ronnie Mann is a 67 y.o. 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 Lonna Cobb on 10/12/2023 for management of an impacted mid right ureteral stone and right UPJ/proximal ureteral stone who presents today for evaluation of right flank pain.  He is scheduled to see [facility] next month in pursuit of bilateral PCNL.  Today he reports 3 days of right lateral abdominal pain and spasming.  He has also been having marked constipation and went 1 week without a BM, however he states this is now improving.  He denies fevers.  He takes 1 pack of MiraLAX daily and remains on Flomax once daily.  He has been prescribed oxybutynin 5 mg, but takes this only once daily at most.  He wants to make sure he has enough pain medication to last until his [facility] follow-up.  In-office UA today positive for 1+ glucose, 3+ blood, 3+ protein, and 3+ leukocytes; urine microscopy with 11-30 WBCs/HPF and >30 RBCs/HPF.  PMH: Past Medical History:  Diagnosis Date   Coronary artery disease 05/01/2023   PCI/DES   HFrEF (heart failure with reduced ejection fraction) (HCC) 05/01/2023   Hiatal hernia    Ischemic cardiomyopathy 05/01/2023   LVEF 30-35%   Kidney stones     Surgical History: Past Surgical History:  Procedure Laterality Date   CORONARY STENT INTERVENTION N/A 05/01/2023   Procedure: CORONARY STENT INTERVENTION;  Surgeon: Iran Ouch, MD;  Location: [facility] INVASIVE CV LAB;  Service: Cardiovascular;  Laterality: N/A;   CYSTOSCOPY W/ URETERAL STENT PLACEMENT Right 10/12/2023   Procedure: CYSTOSCOPY WITH STENT REPLACEMENT;  Surgeon: Riki Altes, MD;  Location: [facility] ORS;  Service: Urology;  Laterality: Right;   LEFT HEART CATH AND CORONARY  ANGIOGRAPHY N/A 05/01/2023   Procedure: LEFT HEART CATH AND CORONARY ANGIOGRAPHY;  Surgeon: Iran Ouch, MD;  Location: [facility] INVASIVE CV LAB;  Service: Cardiovascular;  Laterality: N/A;    Home Medications:  Allergies as of 10/23/2023       Reactions   Losartan Other (See Comments)   Chest pain        Medication List        Accurate as of October 23, 2023  3:12 PM. If you have any questions, ask your nurse or doctor.          STOP taking these medications    losartan 25 MG tablet Commonly known as: COZAAR Stopped by: Delma Freeze   oxybutynin 5 MG tablet Commonly known as: DITROPAN Stopped by: Carman Ching   predniSONE 10 MG tablet Commonly known as: DELTASONE Stopped by: Delma Freeze       TAKE these medications    albuterol 108 (90 Base) MCG/ACT inhaler Commonly known as: VENTOLIN HFA Inhale 2 puffs into the lungs every 6 (six) hours as needed for wheezing or shortness of breath.   aspirin EC 81 MG tablet Take 81 mg by mouth daily. Swallow whole.   atorvastatin 40 MG tablet Commonly known as: LIPITOR Take 1 tablet (40 mg total) by mouth daily.   Entresto 24-26 MG Generic drug: sacubitril-valsartan Take 1 tablet by mouth 2 (two) times daily.   furosemide 20 MG tablet Commonly known as: Lasix Take 1 tablet (20 mg total) by  mouth daily.   Gemtesa 75 MG Tabs Generic drug: Vibegron Take 1 tablet (75 mg total) by mouth daily. Started by: Carman Ching   metoprolol succinate 25 MG 24 hr tablet Commonly known as: TOPROL-XL Take 1 tablet (25 mg total) by mouth daily.   oxyCODONE-acetaminophen 5-325 MG tablet Commonly known as: PERCOCET/ROXICET Take 1 tablet by mouth every 6 (six) hours as needed for severe pain (pain score 7-10).   polyethylene glycol 17 g packet Commonly known as: MIRALAX / GLYCOLAX Take 17 g by mouth daily.   spironolactone 25 MG tablet Commonly known as: ALDACTONE Take 1 tablet (25 mg total) by  mouth daily.   tamsulosin 0.4 MG Caps capsule Commonly known as: FLOMAX Take 1 capsule (0.4 mg total) by mouth daily after supper.        Allergies:  Allergies  Allergen Reactions   Losartan Other (See Comments)    Chest pain    Family History: Family History  Problem Relation Age of Onset   Heart disease Father     Social History:   reports that he has never smoked. He has never been exposed to tobacco smoke. He has never used smokeless tobacco. He reports current drug use. Drug: Marijuana. He reports that he does not drink alcohol.  Physical Exam: BP 126/71   Pulse 98   Temp (!) 97.4 F (36.3 C) (Oral)   Ht 5\' 10"  (1.778 m)   Wt 201 lb (91.2 kg)   BMI 28.84 kg/m   Constitutional:  Alert and oriented, no acute distress, nontoxic appearing HEENT: [place], AT Cardiovascular: No clubbing, cyanosis, or edema Respiratory: Normal respiratory effort, no increased work of breathing Skin: No rashes, bruises or suspicious lesions Neurologic: Grossly intact, no focal deficits, moving all 4 extremities Psychiatric: Normal mood and affect  Laboratory Data: Results for orders placed or performed in visit on 10/23/23  Microscopic Examination   Urine  Result Value Ref Range   WBC, UA 11-30 (A) 0 - 5 /hpf   RBC, Urine >30 (A) 0 - 2 /hpf   Epithelial Cells (non renal) 0-10 0 - 10 /hpf   Bacteria, UA None seen None seen/Few  Urinalysis, Complete  Result Value Ref Range   Specific Gravity, UA 1.020 1.005 - 1.030   pH, UA 6.5 5.0 - 7.5   Color, UA Orange Yellow   Appearance Ur Cloudy (A) Clear   Leukocytes,UA 3+ (A) Negative   Protein,UA 3+ (A) Negative/Trace   Glucose, UA 1+ (A) Negative   Ketones, UA Negative Negative   RBC, UA 3+ (A) Negative   Bilirubin, UA Negative Negative   Urobilinogen, Ur 1.0 0.2 - 1.0 mg/dL   Nitrite, UA Negative Negative   Microscopic Examination See below:    Assessment & Plan:   1. Right lateral abdominal pain Multifactorial in the setting of  right ureteral stent and constipation.  We have to be very careful with his pharmacotherapy to not promote bleeding or constipation.  Low suspicion for UTI at this time and I think his UA is more consistent with his stent and chronic staghorn stones, though will send for culture.  He is afebrile, VSS in clinic today.  I asked him to go get a KUB on the way out today to reassess stent position.  We discussed continuing Flomax, switching from oxybutynin to [place] (I gave him samples today), continuing MiraLAX, and staying very well-hydrated with at least 64 ounces of fluid daily.  I am not prescribing more pain medication, as  this will only contribute to worsening constipation.  We discussed return precautions including fevers. - Urinalysis, Complete - CULTURE, URINE COMPREHENSIVE - DG Abd 1 View; Future - Vibegron (GEMTESA) 75 MG TABS; Take 1 tablet (75 mg total) by mouth daily.  Return if symptoms worsen or fail to improve.  Carman Ching, PA-C  [facility] [place] [address] [place], [place] [address] [phone]

## 2023-10-23 NOTE — Progress Notes (Signed)
PCP: none Primary Cardiologist: Julien Nordmann, MD (saw during 06/24 admission)  HPI:  Ronnie Mann is a 67 y/o male with a history of NSTEMI, s/p PCI to proximal LAD and RCA on 05/01/23, HFrEF(EF 30 to 35%), previous tobacco/ alcohol use, HTN, CKD, UTI, hyperlipidemia, kidney stones since the age of 7 and bilateral nephrolithiasis.   Admitted 04/28/23 due to new onset of substernal "vice like" substernal chest pain that radiated to his left arm. He reports ongoing shortness of breath associated with this. He also was awoken from sleep with this last night and had broken out into a cold sweat. Elevated lactic acid and elevated WBC at 14.4. 1 L of IVF given. Initial troponin was 54 rose to 234. 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 Developed flash pulmonary edema during left heart cath 06/10 and was treated with nitroglycerin drip. Diuretics held. Status post successful balloon angioplasty to the proximal LAD and drug-eluting stent placement to the proximal right coronary artery. Treated for pneumonia with antibiotics. Was in the ED 05/13/23 due to bilateral flank and suprapubic pain. CT renal was stable. Admitted 05/16/23 due to shortness of breath, bilateral flank pain radiating towards lower abdomen, more on right than left, dysuria and gross hematuria. Patient deferred urology stent placement. Able to void after foley removed. Brilinta was switched to Plavix, aspirin discontinued in the setting of hematuria by cardiologist    Admitted 10/10/23 due to shortness of breath, hematuria and flank pain. Started on IV lasix for HF exacerbation. Cardiology consulted and feel respiratory symptoms more related to bronchitis. Lasix held and solu-medrol provided. Respiratory panel negative. Urology consulted and stent placed. Losartan was changed to entresto.   Echo 04/30/23: EF 30-35% along with mild LVH and Grade II DD and moderate Ronnie.  Echo 10/13/23: EF 25-30% with moderate  LAE, moderate Ronnie  LHC 05/01/23:   Prox LAD to Mid LAD lesion is 99% stenosed.   Mid LAD lesion is 60% stenosed.   1st Diag lesion is 70% stenosed.   Prox RCA lesion is 95% stenosed.   Mid RCA lesion is 30% stenosed.   RPDA lesion is 60% stenosed.   Dist LAD lesion is 99% stenosed.   A drug-eluting stent was successfully placed using a STENT ONYX FRONTIER 4.0X15.   Balloon angioplasty was performed using a BALLN [place] EUPHORA RX 2.75X20.   Post intervention, there is a 20% residual stenosis.   Post intervention, there is a 0% residual stenosis.   There is severe left ventricular systolic dysfunction.   LV end diastolic pressure is severely elevated.   There is moderate (3+) mitral regurgitation.  1.  Severe two-vessel coronary artery disease with subtotal occlusion of the proximal LAD, diffuse moderate mid LAD disease and severe distal LAD disease.  In addition, there is 95% in the proximal right coronary artery with faint right to left collaterals to the LAD.  The left circumflex has mild nonobstructive disease and OM 3 is large and reaches the apex. 2.  Severely reduced LV systolic function with an EF of 25%.  Severely elevated left ventricular end-diastolic pressure at 34 mmHg. 3.  Successful balloon angioplasty to the proximal LAD and drug-eluting stent placement to the proximal right coronary artery.  He presents today for a HF f/u visit with a chief complaint of severe bilateral kidney pain worse on the right side. He says that the pain has worsened in the last 24 hours and he's not sure that he's not going  to end up in the ER due to the pain. Has noticed hematuria along with this. Has [facility] urology appointment but not until 11/24/23 and he doesn't think he can wait that long. Has SOB, fatigue, left arm pain, chest pain, gradual weight gain, difficulty sleeping (due to pain) and dizziness along with this. Denies abdominal distention or pedal edema.   At last clinic visit, spironolactone 12.5mg   daily was added along with increasing metoprolol to 25mg  daily. He had an admission last month with meds changed although he has continued to take losartan and says that he does not have entresto. Previously losartan had caused chest pain and it was stopped.   ROS: All systems negative except as listed in HPI, PMH and Problem List.  SH:  Social History   Socioeconomic History   Marital status: Divorced    Spouse name: Not on file   Number of children: Not on file   Years of education: Not on file   Highest education level: 10th grade  Occupational History   Not on file  Tobacco Use   Smoking status: Never    Passive exposure: Never   Smokeless tobacco: Never  Vaping Use   Vaping status: Never Used  Substance and Sexual Activity   Alcohol use: Never   Drug use: Yes    Types: Marijuana   Sexual activity: Not on file  Other Topics Concern   Not on file  Social History Narrative   Not on file   Social Determinants of Health   Financial Resource Strain: Low Risk  (10/12/2023)   Overall Financial Resource Strain (CARDIA)    Difficulty of Paying Living Expenses: Not hard at all  Food Insecurity: No Food Insecurity (10/12/2023)   Hunger Vital Sign    Worried About Running Out of Food in the Last Year: Never true    Ran Out of Food in the Last Year: Never true  Transportation Needs: No Transportation Needs (10/12/2023)   PRAPARE - Administrator, Civil Service (Medical): No    Lack of Transportation (Non-Medical): No  Physical Activity: Not on file  Stress: Not on file  Social Connections: Not on file  Intimate Partner Violence: Not At Risk (10/11/2023)   Humiliation, Afraid, Rape, and Kick questionnaire    Fear of Current or Ex-Partner: No    Emotionally Abused: No    Physically Abused: No    Sexually Abused: No    FH:  Family History  Problem Relation Age of Onset   Heart disease Father     Past Medical History:  Diagnosis Date   Coronary artery  disease 05/01/2023   PCI/DES   HFrEF (heart failure with reduced ejection fraction) (HCC) 05/01/2023   Hiatal hernia    Ischemic cardiomyopathy 05/01/2023   LVEF 30-35%   Kidney stones     Current Outpatient Medications  Medication Sig Dispense Refill   albuterol (VENTOLIN HFA) 108 (90 Base) MCG/ACT inhaler Inhale 2 puffs into the lungs every 6 (six) hours as needed for wheezing or shortness of breath. 17 each 0   aspirin EC 81 MG tablet Take 81 mg by mouth daily. Swallow whole.     atorvastatin (LIPITOR) 40 MG tablet Take 1 tablet (40 mg total) by mouth daily. 30 tablet 0   furosemide (LASIX) 20 MG tablet Take 1 tablet (20 mg total) by mouth daily. 30 tablet 0   metoprolol succinate (TOPROL-XL) 25 MG 24 hr tablet Take 1 tablet (25 mg total) by mouth daily.  90 tablet 3   oxybutynin (DITROPAN) 5 MG tablet Take 1 tablet (5 mg total) by mouth every 8 (eight) hours as needed for bladder spasms. 30 tablet 0   oxyCODONE-acetaminophen (PERCOCET/ROXICET) 5-325 MG tablet Take 1 tablet by mouth every 6 (six) hours as needed for severe pain (pain score 7-10). 10 tablet 0   polyethylene glycol (MIRALAX / GLYCOLAX) 17 g packet Take 17 g by mouth daily. 30 each 0   predniSONE (DELTASONE) 10 MG tablet 3 tabs po day 1; 2 tabs po day 2; 1 tab po day 3,4; 1/2 tab po day 5,6 8 tablet 0   sacubitril-valsartan (ENTRESTO) 24-26 MG Take 1 tablet by mouth 2 (two) times daily. 60 tablet 3   spironolactone (ALDACTONE) 25 MG tablet Take 1 tablet (25 mg total) by mouth daily. 45 tablet 3   tamsulosin (FLOMAX) 0.4 MG CAPS capsule Take 1 capsule (0.4 mg total) by mouth daily after supper. 30 capsule 0   No current facility-administered medications for this visit.   Vitals:   10/23/23 0841  BP: 110/63  Pulse: 77  Resp: 20  SpO2: 99%  Weight: 201 lb 8 oz (91.4 kg)   Wt Readings from Last 3 Encounters:  10/23/23 201 lb 8 oz (91.4 kg)  10/14/23 191 lb (86.6 kg)  08/30/23 201 lb 12.8 oz (91.5 kg)   Lab Results   Component Value Date   CREATININE 1.33 (H) 10/14/2023   CREATININE 1.57 (H) 10/13/2023   CREATININE 1.50 (H) 10/12/2023    PHYSICAL EXAM:  General:  Appears in severe pain with intermittent groaning. No resp difficulty HEENT: normal Neck: supple. JVP flat. No lymphadenopathy or thryomegaly appreciated. Cor: PMI normal. Regular rate & rhythm. No rubs, gallops or murmurs. Lungs: sporadic rhonchi in lower lobes Abdomen: soft, nontender, nondistended. No hepatosplenomegaly. No bruits or masses.  Extremities: no cyanosis, clubbing, rash, edema Neuro: alert & oriented x3, cranial nerves grossly intact. Moves all 4 extremities w/o difficulty. Affect pleasant.   ECG: not done   ASSESSMENT & PLAN:  1: Ischemic heart failure with reduced ejection fraction- - NSTEMI with PCI 06/24 - NYHA class II - euvolemic - weighing daily; reminded to call for an overnight weight gain of > 2 pounds or a weekly weight gain of > 5 pounds - weight unchanged from last visit here 3 months ago - Echo 04/30/23: EF 30-35% along with mild LVH and Grade II DD and moderate Ronnie.  - Echo 10/13/23: EF 25-30% with moderate LAE, moderate Ronnie - continue metoprolol succinate 25mg  daily - stop losartan (previously caused chest pain) and begin entresto 24/26mg  BID - continue spironolactone 25mg  daily - BMET today - due to chronic kidney stone; may not be a good candidate for SGLT2 - BNP 10/10/23 was 975.7  2: CAD- - was  NS with cardiology Mariah Milling) 08/24; this will need to be R/S - continue atorvastatin 40mg  daily - continue ASA 81mg  daily - LHC 05/01/23:   Prox LAD to Mid LAD lesion is 99% stenosed.   Mid LAD lesion is 60% stenosed.   1st Diag lesion is 70% stenosed.   Prox RCA lesion is 95% stenosed.   Mid RCA lesion is 30% stenosed.   RPDA lesion is 60% stenosed.   Dist LAD lesion is 99% stenosed.   A drug-eluting stent was successfully placed using a STENT ONYX FRONTIER 4.0X15.   Balloon angioplasty was  performed using a BALLN [place] EUPHORA RX 2.75X20.   Post intervention, there is a 20% residual  stenosis.   Post intervention, there is a 0% residual stenosis.   There is severe left ventricular systolic dysfunction.   LV end diastolic pressure is severely elevated.   There is moderate (3+) mitral regurgitation.  1.  Severe two-vessel coronary artery disease with subtotal occlusion of the proximal LAD, diffuse moderate mid LAD disease and severe distal LAD disease.  In addition, there is 95% in the proximal right coronary artery with faint right to left collaterals to the LAD.  The left circumflex has mild nonobstructive disease and OM 3 is large and reaches the apex. 2.  Severely reduced LV systolic function with an EF of 25%.  Severely elevated left ventricular end-diastolic pressure at 34 mmHg. 3.  Successful balloon angioplasty to the proximal LAD and drug-eluting stent placement to the proximal right coronary artery.  3: Kidney disease- - saw urology Richardo Hanks) 07/24; has [facility] urology appt 01/25 - has bilateral staghorn renal stones and has hematuria - right ureteral stent placed 10/12/23; having worsening pain over last 24 hours - encouraged to contact [facility] urology to see if they can see him sooner or contact [facility] urology who recently placed stent to see if they can see him - if pain worsens or he develops a fever, he should present to the ER - possible PCNL in the future   4: HTN- - BP 110/63 - currently has no PCP - BMP 10/14/23 showed sodium 136, potassium 4.07, creatinine 1.33 & GFR 59 - BMET today  Return in 2 weeks with pharmacist for GDTM titration. Sooner if needed.

## 2023-10-23 NOTE — Patient Instructions (Addendum)
Go to the [facility] and pick up your entresto.  STOP taking Losartan. START taking Entresto   Follow up with our pharmacist, Enos Fling, in 2 weeks.  We will call you with any abnormal lab results.

## 2023-10-24 LAB — BASIC METABOLIC PANEL
BUN/Creatinine Ratio: 20 (ref 10–24)
BUN: 26 mg/dL (ref 8–27)
CO2: 24 mmol/L (ref 20–29)
Calcium: 9.2 mg/dL (ref 8.6–10.2)
Chloride: 101 mmol/L (ref 96–106)
Creatinine, Ser: 1.28 mg/dL — ABNORMAL HIGH (ref 0.76–1.27)
Glucose: 147 mg/dL — ABNORMAL HIGH (ref 70–99)
Potassium: 4.9 mmol/L (ref 3.5–5.2)
Sodium: 139 mmol/L (ref 134–144)
eGFR: 61 mL/min/{1.73_m2} (ref >59)

## 2023-10-27 LAB — CULTURE, URINE COMPREHENSIVE

## 2023-11-04 ENCOUNTER — Other Ambulatory Visit: Payer: Self-pay

## 2023-11-04 ENCOUNTER — Emergency Department: Payer: Medicare HMO

## 2023-11-04 ENCOUNTER — Inpatient Hospital Stay
Admission: EM | Admit: 2023-11-04 | Discharge: 2023-11-06 | DRG: 694 | Disposition: A | Discharge status: Home Health | Payer: Medicare HMO | Attending: Hospitalist | Admitting: Hospitalist

## 2023-11-04 DIAGNOSIS — R059 Cough, unspecified: Secondary | ICD-10-CM | POA: Diagnosis present

## 2023-11-04 DIAGNOSIS — M47812 Spondylosis without myelopathy or radiculopathy, cervical region: Secondary | ICD-10-CM | POA: Diagnosis present

## 2023-11-04 DIAGNOSIS — R053 Chronic cough: Secondary | ICD-10-CM | POA: Diagnosis present

## 2023-11-04 DIAGNOSIS — I2510 Atherosclerotic heart disease of native coronary artery without angina pectoris: Secondary | ICD-10-CM | POA: Diagnosis present

## 2023-11-04 DIAGNOSIS — E785 Hyperlipidemia, unspecified: Secondary | ICD-10-CM | POA: Diagnosis present

## 2023-11-04 DIAGNOSIS — R531 Weakness: Secondary | ICD-10-CM | POA: Diagnosis present

## 2023-11-04 DIAGNOSIS — Y92009 Unspecified place in unspecified non-institutional (private) residence as the place of occurrence of the external cause: Secondary | ICD-10-CM | POA: Diagnosis not present

## 2023-11-04 DIAGNOSIS — N132 Hydronephrosis with renal and ureteral calculous obstruction: Principal | ICD-10-CM | POA: Diagnosis present

## 2023-11-04 DIAGNOSIS — Z8249 Family history of ischemic heart disease and other diseases of the circulatory system: Secondary | ICD-10-CM | POA: Diagnosis not present

## 2023-11-04 DIAGNOSIS — R109 Unspecified abdominal pain: Principal | ICD-10-CM | POA: Diagnosis present

## 2023-11-04 DIAGNOSIS — N400 Enlarged prostate without lower urinary tract symptoms: Secondary | ICD-10-CM | POA: Diagnosis present

## 2023-11-04 DIAGNOSIS — I252 Old myocardial infarction: Secondary | ICD-10-CM

## 2023-11-04 DIAGNOSIS — I959 Hypotension, unspecified: Secondary | ICD-10-CM | POA: Diagnosis present

## 2023-11-04 DIAGNOSIS — I5022 Chronic systolic (congestive) heart failure: Secondary | ICD-10-CM | POA: Diagnosis present

## 2023-11-04 DIAGNOSIS — M4602 Spinal enthesopathy, cervical region: Secondary | ICD-10-CM | POA: Diagnosis present

## 2023-11-04 DIAGNOSIS — D649 Anemia, unspecified: Secondary | ICD-10-CM | POA: Diagnosis present

## 2023-11-04 DIAGNOSIS — I130 Hypertensive heart and chronic kidney disease with heart failure and stage 1 through stage 4 chronic kidney disease, or unspecified chronic kidney disease: Secondary | ICD-10-CM | POA: Diagnosis present

## 2023-11-04 DIAGNOSIS — Z79899 Other long term (current) drug therapy: Secondary | ICD-10-CM

## 2023-11-04 DIAGNOSIS — N10 Acute tubulo-interstitial nephritis: Principal | ICD-10-CM | POA: Diagnosis present

## 2023-11-04 DIAGNOSIS — Z1152 Encounter for screening for COVID-19: Secondary | ICD-10-CM | POA: Diagnosis not present

## 2023-11-04 DIAGNOSIS — K8020 Calculus of gallbladder without cholecystitis without obstruction: Secondary | ICD-10-CM | POA: Diagnosis present

## 2023-11-04 DIAGNOSIS — W19XXXA Unspecified fall, initial encounter: Secondary | ICD-10-CM

## 2023-11-04 DIAGNOSIS — Z87442 Personal history of urinary calculi: Secondary | ICD-10-CM | POA: Diagnosis not present

## 2023-11-04 DIAGNOSIS — I255 Ischemic cardiomyopathy: Secondary | ICD-10-CM | POA: Diagnosis present

## 2023-11-04 DIAGNOSIS — Z955 Presence of coronary angioplasty implant and graft: Secondary | ICD-10-CM | POA: Diagnosis not present

## 2023-11-04 DIAGNOSIS — Z7982 Long term (current) use of aspirin: Secondary | ICD-10-CM

## 2023-11-04 DIAGNOSIS — K449 Diaphragmatic hernia without obstruction or gangrene: Secondary | ICD-10-CM | POA: Diagnosis present

## 2023-11-04 DIAGNOSIS — I700 Atherosclerosis of aorta: Secondary | ICD-10-CM | POA: Diagnosis present

## 2023-11-04 DIAGNOSIS — Z888 Allergy status to other drugs, medicaments and biological substances status: Secondary | ICD-10-CM

## 2023-11-04 DIAGNOSIS — R1031 Right lower quadrant pain: Secondary | ICD-10-CM | POA: Diagnosis not present

## 2023-11-04 DIAGNOSIS — M4802 Spinal stenosis, cervical region: Secondary | ICD-10-CM | POA: Diagnosis present

## 2023-11-04 DIAGNOSIS — I10 Essential (primary) hypertension: Secondary | ICD-10-CM | POA: Diagnosis present

## 2023-11-04 DIAGNOSIS — N189 Chronic kidney disease, unspecified: Secondary | ICD-10-CM | POA: Diagnosis present

## 2023-11-04 DIAGNOSIS — I5A Non-ischemic myocardial injury (non-traumatic): Secondary | ICD-10-CM | POA: Diagnosis present

## 2023-11-04 DIAGNOSIS — N1832 Chronic kidney disease, stage 3b: Secondary | ICD-10-CM | POA: Diagnosis present

## 2023-11-04 DIAGNOSIS — N1330 Unspecified hydronephrosis: Secondary | ICD-10-CM | POA: Diagnosis not present

## 2023-11-04 DIAGNOSIS — I5042 Chronic combined systolic (congestive) and diastolic (congestive) heart failure: Secondary | ICD-10-CM | POA: Diagnosis present

## 2023-11-04 HISTORY — DX: Essential (primary) hypertension: I10

## 2023-11-04 HISTORY — DX: Hyperlipidemia, unspecified: E78.5

## 2023-11-04 LAB — CBC WITH DIFFERENTIAL/PLATELET
Abs Immature Granulocytes: 0.01 10*3/uL (ref 0.00–0.07)
Basophils Absolute: 0 10*3/uL (ref 0.0–0.1)
Basophils Relative: 1 %
Eosinophils Absolute: 0.5 10*3/uL (ref 0.0–0.5)
Eosinophils Relative: 9 %
HCT: 28.1 % — ABNORMAL LOW (ref 39.0–52.0)
Hemoglobin: 9 g/dL — ABNORMAL LOW (ref 13.0–17.0)
Immature Granulocytes: 0 %
Lymphocytes Relative: 12 %
Lymphs Abs: 0.7 10*3/uL (ref 0.7–4.0)
MCH: 29.6 pg (ref 26.0–34.0)
MCHC: 32 g/dL (ref 30.0–36.0)
MCV: 92.4 fL (ref 80.0–100.0)
Monocytes Absolute: 0.5 10*3/uL (ref 0.1–1.0)
Monocytes Relative: 9 %
Neutro Abs: 3.8 10*3/uL (ref 1.7–7.7)
Neutrophils Relative %: 69 %
Platelets: 145 10*3/uL — ABNORMAL LOW (ref 150–400)
RBC: 3.04 MIL/uL — ABNORMAL LOW (ref 4.22–5.81)
RDW: 16.9 % — ABNORMAL HIGH (ref 11.5–15.5)
WBC: 5.4 10*3/uL (ref 4.0–10.5)
nRBC: 0 % (ref 0.0–0.2)

## 2023-11-04 LAB — COMPREHENSIVE METABOLIC PANEL
ALT: 19 U/L (ref 0–44)
AST: 24 U/L (ref 15–41)
Albumin: 3.3 g/dL — ABNORMAL LOW (ref 3.5–5.0)
Alkaline Phosphatase: 71 U/L (ref 38–126)
Anion gap: 7 (ref 5–15)
BUN: 41 mg/dL — ABNORMAL HIGH (ref 8–23)
CO2: 19 mmol/L — ABNORMAL LOW (ref 22–32)
Calcium: 7.7 mg/dL — ABNORMAL LOW (ref 8.9–10.3)
Chloride: 110 mmol/L (ref 98–111)
Creatinine, Ser: 1.32 mg/dL — ABNORMAL HIGH (ref 0.61–1.24)
GFR, Estimated: 59 mL/min — ABNORMAL LOW (ref >60)
Glucose, Bld: 142 mg/dL — ABNORMAL HIGH (ref 70–99)
Potassium: 3.7 mmol/L (ref 3.5–5.1)
Sodium: 136 mmol/L (ref 135–145)
Total Bilirubin: 0.4 mg/dL (ref <1.2)
Total Protein: 6 g/dL — ABNORMAL LOW (ref 6.5–8.1)

## 2023-11-04 LAB — APTT: aPTT: 34 s (ref 24–36)

## 2023-11-04 LAB — URINALYSIS, ROUTINE W REFLEX MICROSCOPIC
Bilirubin Urine: NEGATIVE
Glucose, UA: NEGATIVE mg/dL
Ketones, ur: NEGATIVE mg/dL
Nitrite: NEGATIVE
Protein, ur: 100 mg/dL — AB
RBC / HPF: >50 RBC/hpf (ref 0–5)
Specific Gravity, Urine: 1.013 (ref 1.005–1.030)
WBC, UA: >50 WBC/hpf (ref 0–5)
pH: 5 (ref 5.0–8.0)

## 2023-11-04 LAB — PROTIME-INR
INR: 1.1 (ref 0.8–1.2)
Prothrombin Time: 14.7 s (ref 11.4–15.2)

## 2023-11-04 LAB — LIPASE, BLOOD: Lipase: 30 U/L (ref 11–51)

## 2023-11-04 LAB — TROPONIN I (HIGH SENSITIVITY)
Troponin I (High Sensitivity): 19 ng/L — ABNORMAL HIGH (ref <18)
Troponin I (High Sensitivity): 18 ng/L — ABNORMAL HIGH (ref <18)

## 2023-11-04 LAB — BRAIN NATRIURETIC PEPTIDE: B Natriuretic Peptide: 198.6 pg/mL — ABNORMAL HIGH (ref 0.0–100.0)

## 2023-11-04 MED ORDER — IPRATROPIUM-ALBUTEROL 0.5-2.5 (3) MG/3ML IN SOLN: 3.0000 mL | RESPIRATORY_TRACT | Status: DC

## 2023-11-04 MED ORDER — IOHEXOL 350 MG/ML SOLN
75.0000 mL | Freq: Once | INTRAVENOUS | Status: AC | PRN
  Administered 2023-11-04: 75 mL via INTRAVENOUS

## 2023-11-04 MED ORDER — DM-GUAIFENESIN ER 30-600 MG PO TB12: 1.0000 | ORAL_TABLET | Freq: Two times a day (BID) | ORAL | Status: DC | PRN

## 2023-11-04 MED ORDER — PIPERACILLIN-TAZOBACTAM 3.375 G IVPB
3.3750 g | Freq: Once | INTRAVENOUS | Status: AC
  Administered 2023-11-04: 3.375 g via INTRAVENOUS
  Filled 2023-11-04: qty 50

## 2023-11-04 MED ORDER — ONDANSETRON HCL 4 MG/2ML IJ SOLN
4.0000 mg | Freq: Once | INTRAMUSCULAR | Status: AC
  Administered 2023-11-04: 4 mg via INTRAVENOUS
  Filled 2023-11-04: qty 2

## 2023-11-04 MED ORDER — HEPARIN SODIUM (PORCINE) 5000 UNIT/ML IJ SOLN
5000.0000 [IU] | Freq: Three times a day (TID) | INTRAMUSCULAR | Status: DC
  Administered 2023-11-05 – 2023-11-06 (×5): 5000 [IU] via SUBCUTANEOUS
  Filled 2023-11-04 (×5): qty 1

## 2023-11-04 MED ORDER — METHYLPREDNISOLONE SODIUM SUCC 125 MG IJ SOLR
125.0000 mg | Freq: Once | INTRAMUSCULAR | Status: AC
  Administered 2023-11-04: 125 mg via INTRAVENOUS
  Filled 2023-11-04: qty 2

## 2023-11-04 MED ORDER — OXYCODONE-ACETAMINOPHEN 5-325 MG PO TABS
1.0000 | ORAL_TABLET | ORAL | Status: DC | PRN
  Administered 2023-11-05 – 2023-11-06 (×4): 1 via ORAL
  Filled 2023-11-04 (×5): qty 1

## 2023-11-04 MED ORDER — OXYCODONE HCL 5 MG PO TABS
5.0000 mg | ORAL_TABLET | Freq: Once | ORAL | Status: AC
  Administered 2023-11-04: 5 mg via ORAL
  Filled 2023-11-04: qty 1

## 2023-11-04 MED ORDER — HYDROMORPHONE HCL 1 MG/ML IJ SOLN
0.5000 mg | Freq: Once | INTRAMUSCULAR | Status: AC
  Administered 2023-11-04: 0.5 mg via INTRAVENOUS
  Filled 2023-11-04: qty 0.5

## 2023-11-04 MED ORDER — PIPERACILLIN-TAZOBACTAM 3.375 G IVPB
3.3750 g | Freq: Three times a day (TID) | INTRAVENOUS | Status: DC
  Administered 2023-11-05 – 2023-11-06 (×4): 3.375 g via INTRAVENOUS
  Filled 2023-11-04 (×4): qty 50

## 2023-11-04 MED ORDER — TAMSULOSIN HCL 0.4 MG PO CAPS
0.4000 mg | ORAL_CAPSULE | Freq: Every day | ORAL | Status: DC
  Administered 2023-11-04 – 2023-11-06 (×3): 0.4 mg via ORAL
  Filled 2023-11-04 (×3): qty 1

## 2023-11-04 MED ORDER — ALBUTEROL SULFATE HFA 108 (90 BASE) MCG/ACT IN AERS: 2.0000 | INHALATION_SPRAY | RESPIRATORY_TRACT | Status: DC | PRN

## 2023-11-04 MED ORDER — FENTANYL CITRATE PF 50 MCG/ML IJ SOSY
50.0000 ug | PREFILLED_SYRINGE | Freq: Once | INTRAMUSCULAR | Status: AC
  Administered 2023-11-04: 50 ug via INTRAVENOUS
  Filled 2023-11-04: qty 1

## 2023-11-04 MED ORDER — MORPHINE SULFATE (PF) 2 MG/ML IV SOLN
2.0000 mg | INTRAVENOUS | Status: DC | PRN
  Administered 2023-11-05: 2 mg via INTRAVENOUS
  Filled 2023-11-04: qty 1

## 2023-11-04 MED ORDER — IPRATROPIUM-ALBUTEROL 0.5-2.5 (3) MG/3ML IN SOLN
3.0000 mL | Freq: Once | RESPIRATORY_TRACT | Status: AC
Start: 2023-11-04 — End: 2023-11-04
  Administered 2023-11-04: 3 mL via RESPIRATORY_TRACT
  Filled 2023-11-04: qty 3

## 2023-11-04 NOTE — ED Triage Notes (Signed)
First nurse note: Pt to ED [facility] from home for right flank pain, reports confirmed kidney stones but awaiting approval for surgical intervention fentanyl PTA 18g L FA

## 2023-11-04 NOTE — ED Notes (Signed)
Pt transported to CT scan at this time.

## 2023-11-04 NOTE — ED Provider Notes (Signed)
[facility] Provider Note    Event Date/Time   First MD Initiated Contact with Patient 11/04/23 1626     (approximate)   History   Cough and Flank Pain   HPI  Ronnie Mann is a 67 y.o. male with history of CHF, EF of 30 to 35%, hypertension, hyperlipidemia, known kidney stone status post stenting who comes in with concerns for worsening flank pain.  Patient reports worsening right-sided flank pain.  He states that he ran out of his pain medications a long time ago and that the pain seems to be coming back.  He also reports a chronic cough that really has been getting better since he was admitted.  I reviewed the hospital admission note where it looks like they were treating him for potential CHF but then thought that it was more likely bronchitis and so was given some steroids.  Patient has been following up with urology for his known kidney stones with stents that are placed.  The plan is to follow-up on January 3 with [facility] urology.  Patient does also report that he had a fall yesterday and hit his head.  He reports some neck pain associated with it.     Physical Exam   Triage Vital Signs: ED Triage Vitals  Encounter Vitals Group     BP 11/04/23 1403 109/69     Systolic BP Percentile --      Diastolic BP Percentile --      Pulse Rate 11/04/23 1403 70     Resp 11/04/23 1403 (!) 21     Temp 11/04/23 1403 98.7 F (37.1 C)     Temp Source 11/04/23 1403 Oral     SpO2 11/04/23 1403 99 %     Weight --      Height --      Head Circumference --      Peak Flow --      Pain Score 11/04/23 1404 10     Pain Loc --      Pain Education --      Exclude from Growth Chart --     Most recent vital signs: Vitals:   11/04/23 1403  BP: 109/69  Pulse: 70  Resp: (!) 21  Temp: 98.7 F (37.1 C)  SpO2: 99%     General: Awake, no distress.  CV:  Good peripheral perfusion.  Resp:  Normal effort.  Abd:  No distention.  Other:  Patient has some right flank  tenderness.  He is frequently coughing with some wheezing noted. C-spine tenderness.  ED Results / Procedures / Treatments   Labs (all labs ordered are listed, but only abnormal results are displayed) Labs Reviewed  COMPREHENSIVE METABOLIC PANEL - Abnormal; Notable for the following components:      Result Value   CO2 19 (*)    Glucose, Bld 142 (*)    BUN 41 (*)    Creatinine, Ser 1.32 (*)    Calcium 7.7 (*)    Total Protein 6.0 (*)    Albumin 3.3 (*)    GFR, Estimated 59 (*)    All other components within normal limits  CBC WITH DIFFERENTIAL/PLATELET - Abnormal; Notable for the following components:   RBC 3.04 (*)    Hemoglobin 9.0 (*)    HCT 28.1 (*)    RDW 16.9 (*)    Platelets 145 (*)    All other components within normal limits  URINALYSIS, ROUTINE W REFLEX MICROSCOPIC - Abnormal; Notable  for the following components:   Color, Urine YELLOW (*)    APPearance HAZY (*)    Hgb urine dipstick LARGE (*)    Protein, ur 100 (*)    Leukocytes,Ua LARGE (*)    Bacteria, UA RARE (*)    All other components within normal limits  TROPONIN I (HIGH SENSITIVITY) - Abnormal; Notable for the following components:   Troponin I (High Sensitivity) 19 (*)    All other components within normal limits  URINE CULTURE  LIPASE, BLOOD  BRAIN NATRIURETIC PEPTIDE  TROPONIN I (HIGH SENSITIVITY)     EKG  My interpretation of EKG:  Normal sinus rate of 67 without any ST elevation or T wave inversions, right bundle branch block left anterior fascicular block  RADIOLOGY I have reviewed the xray personally and interpreted, large hiatal hernia but no focal pneumonia  PROCEDURES:  Critical Care performed: No  Procedures   MEDICATIONS ORDERED IN ED: Medications  oxyCODONE (Oxy IR/ROXICODONE) immediate release tablet 5 mg (has no administration in time range)  ipratropium-albuterol (DUONEB) 0.5-2.5 (3) MG/3ML nebulizer solution 3 mL (has no administration in time range)   methylPREDNISolone sodium succinate (SOLU-MEDROL) 125 mg/2 mL injection 125 mg (has no administration in time range)  iohexol (OMNIPAQUE) 350 MG/ML injection 75 mL (75 mLs Intravenous Contrast Given 11/04/23 1658)     IMPRESSION / MDM / ASSESSMENT AND PLAN / ED COURSE  I reviewed the triage vital signs and the nursing notes.   Patient's presentation is most consistent with acute presentation with potential threat to life or bodily function.   For patient's flank pain.  CT imaging ordered to evaluate for issues with the stents including worsening hydronephrosis.  Urine evaluate for UTI other patient is not febrile or have a elevated white count.  Will get CT imaging to rule out intracranial hemorrhage, cervical fracture.  Given his recurrent coughing and shortness of breath we will also get CT PE to evaluate for potential causes of continued cough.  Patient given some IV pain medication  CT imaging overall reassuring other than concerns for possible gallstones.  On repeat evaluation he is got tenderness in the right upper quadrant  Given his urine I did discuss the case with Aherrick from urology but they do not feel that his discomfort is related to that and urine is most likely looking like that due to his known stent.  Patient's pain is situated over the right upper quadrant and right flank therefore I did get an ultrasound that was concerning for acute cholecystitis.  Attempted to page surgery but he is currently in surgery.  I did update patient that that is what we are waiting for.  Will give a dose of IV fentanyl while awaiting discussion with surgery  D/w [place] and he is not convinced that this is the gallbladder.  He did recommend HIDA scan and admission to the hospitalist given his comorbidities.  He did want to start him on antibiotics.   The patient is on the cardiac monitor to evaluate for evidence of arrhythmia and/or significant heart rate changes.  Clinical Course as of  11/04/23 1710  Sat Nov 04, 2023  1640 CT Renal [facility] [MF]    Clinical Course User Index [MF] Concha Se, MD     FINAL CLINICAL IMPRESSION(S) / ED DIAGNOSES   Final diagnoses:  Calculus of gallbladder without cholecystitis without obstruction     Rx / DC Orders   ED Discharge Orders     None  Note:  This document was prepared using Dragon voice recognition software and may include unintentional dictation errors.   Concha Se, MD 11/04/23 [phone]

## 2023-11-04 NOTE — ED Provider Triage Note (Signed)
Emergency Medicine Provider Triage Evaluation Note  Ronnie Mann , a 67 y.o. male  was evaluated in triage.  Pt complains of cough and congestion for 2 weeks, flank pain with kidney stone like pain..  Review of Systems  Positive:  Negative:   Physical Exam  There were no vitals taken for this visit. Gen:   Awake, no distress   Resp:  Normal effort  MSK:   Moves extremities without difficulty  Other:    Medical Decision Making  Medically screening exam initiated at 1:59 PM.  Appropriate orders placed.  [name] was informed that the remainder of the evaluation will be completed by another provider, this initial triage assessment does not replace that evaluation, and the importance of remaining in the ED until their evaluation is complete.     Faythe Ghee, PA-C 11/04/23 1359

## 2023-11-04 NOTE — ED Notes (Signed)
Dr. Jari Pigg at bedside at this time.

## 2023-11-04 NOTE — ED Notes (Signed)
US at bedside at this time 

## 2023-11-04 NOTE — ED Notes (Signed)
Pt able to use urinal without assistance.  

## 2023-11-04 NOTE — H&P (Signed)
History and Physical    SHADOW ENSING ZOX:[id number] DOB: Mar 27, 1956 DOA: 11/04/2023  Referring MD/NP/PA:   PCP: Pcp, No   Patient coming from:  The patient is coming from home.     Chief Complaint: Right flank pain  HPI: Ronnie Mann is a 67 y.o. male with medical history significant of right hydronephrosis and right ureteral stone (s/p of recent stent placement), sCHF with EF 25%, HTN, HLD, CAD with stent, CKD-3a, anemia, hiatal hernia, obesity, who presents with right flank pain.  Pt recently had stent placement due to right ureteral stone with right hydronephrosis by Dr. Lonna Cobb of urology on 10/12/23. Pt states that he has right flank pain in the past 3 days, which is constant, aching, 9 out of 10 in severity, radiating to the right upper quadrant of abdomen.  Patient has nausea, no vomiting, diarrhea.  No symptoms of UTI.  Patient also reports dry cough, mild shortness of breath and intermittent mild left-sided chest pain.  No fever or chills.  Patient states that he fell in the bathroom yesterday, he injured his frontal head.  He had lightheadedness, but no unilateral numbness or tingling in extremities.  No facial droop or slurred speech.  Data reviewed independently and ED Course: pt was found to have WBC 5.4, troponin level 19 --> 18, BNP 198, stable renal function, positive urinalysis (hazy appearance, large amount of leukocyte, rare bacteria, WBC> 50).  Temperature normal, blood pressure 99/56, 158/78, heart rate 78, RR 21, 14, oxygen saturation 92-98% on room air.  Chest x-ray negative.  Patient is admitted to telemetry bed as inpatient.  Dr. Aleen Campi of surgery is consulted by EDP.  EDP also discussed with urologist, Dr. Marlou Porch.   US-RUQ Cholelithiasis with positive sonographic Murphy's sign and borderline thickening of the gallbladder wall at the fundus. Findings are suspicious for acute cholecystitis.    CT-renal stone Interval placement of right ureteral stent in  appropriate position. Persistent calculi in the mid right ureter, right renal pelvis, and proximal to mid left ureter.   Mild bilateral hydronephrosis shows mild decrease since prior study.   Bilateral nephrolithiasis including partial staghorn calculi.   Cholelithiasis. No radiographic evidence of cholecystitis.   Large hiatal hernia.   Stable mildly enlarged prostate.    CTA:  No evidence of pulmonary embolus. Large hiatal hernia.  Right hydronephrosis with right ureteral stent in place. Multiple right renal pelvic and proximal ureteral stones present. Upper pole staghorn calculus.   Cholelithiasis.   Aortic Atherosclerosis (ICD10-I70.0).   CT-head 1. Left supraorbital scalp soft tissue swelling without underlying fracture. 2. No acute intracranial abnormality. 3. Periventricular white matter hypoattenuation has advanced since prior exam. This likely reflects the sequela of chronic microvascular ischemia.  CT-C spin 1. No acute fracture or traumatic subluxation. 2. Progressive endplate changes and uncovertebral spurring at C6-7 results in severe right and moderate left foraminal stenosis. 3. Mild foraminal narrowing bilaterally at C5-6. 4. Asymmetric right-sided facet hypertrophy contributes to moderate right foraminal narrowing at C5-6.     EKG: I have personally reviewed.  Sinus rhythm, QTc 441, LAD, poor R wave progression   Review of Systems:   General: no fevers, chills, no body weight gain, has poor appetite, has fatigue HEENT: no blurry vision, hearing changes or sore throat Respiratory: has dyspnea, coughing, no wheezing CV: has chest pain, no palpitations GI: has nausea, no vomiting, abdominal pain, diarrhea, constipation GU: no dysuria, burning on urination, increased urinary frequency, hematuria  Ext: no leg edema Neuro:  no unilateral weakness, numbness, or tingling, no vision change or hearing loss Skin: no rash, no skin tear. MSK: No muscle  spasm, no deformity, no limitation of range of movement in spin. Has right flank pain Heme: No easy bruising.  Travel history: No recent long distant travel.   Allergy:  Allergies  Allergen Reactions   Losartan Other (See Comments)    Chest pain    Past Medical History:  Diagnosis Date   Coronary artery disease 05/01/2023   PCI/DES   HFrEF (heart failure with reduced ejection fraction) (HCC) 05/01/2023   Hiatal hernia    HLD (hyperlipidemia)    HTN (hypertension)    Ischemic cardiomyopathy 05/01/2023   LVEF 30-35%   Kidney stones     Past Surgical History:  Procedure Laterality Date   CORONARY STENT INTERVENTION N/A 05/01/2023   Procedure: CORONARY STENT INTERVENTION;  Surgeon: Iran Ouch, MD;  Location: [facility] INV[facility]IVE [facility];  Service: Cardiovascular;  Laterality: N/A;   CYSTOSCOPY W/ URETERAL STENT PLACEMENT Right 10/12/2023   Procedure: CYSTOSCOPY WITH STENT REPLACEMENT;  Surgeon: Riki Altes, MD;  Location: [facility] ORS;  Service: Urology;  Laterality: Right;   LEFT HEART CATH AND CORONARY ANGIOGRAPHY N/A 05/01/2023   Procedure: LEFT HEART CATH AND CORONARY ANGIOGRAPHY;  Surgeon: Iran Ouch, MD;  Location: [facility] INV[facility] CV [facility];  Service: Cardiovascular;  Laterality: N/A;    Social History:  reports that he has never smoked. He has never been exposed to tobacco smoke. He has never used smokeless tobacco. He reports current drug use. Drug: Marijuana. He reports that he does not drink alcohol.  Family History:  Family History  Problem Relation Age of Onset   Heart disease Father      Prior to Admission medications   Medication Sig Start Date End Date Taking? Authorizing Provider  albuterol (VENTOLIN HFA) 108 (90 Base) MCG/ACT inhaler Inhale 2 puffs into the lungs every 6 (six) hours as needed for wheezing or shortness of breath. 10/14/23   Alford Highland, MD  aspirin EC 81 MG tablet Take 81 mg by mouth daily. Swallow whole.    [provider]   atorvastatin (LIPITOR) 40 MG tablet Take 1 tablet (40 mg total) by mouth daily. 10/14/23 11/13/23  Alford Highland, MD  furosemide (LASIX) 20 MG tablet Take 1 tablet (20 mg total) by mouth daily. 10/14/23   Alford Highland, MD  metoprolol succinate (TOPROL-XL) 25 MG 24 hr tablet Take 1 tablet (25 mg total) by mouth daily. 06/30/23   Delma Freeze, FNP  oxyCODONE-acetaminophen (PERCOCET/ROXICET) 5-325 MG tablet Take 1 tablet by mouth every 6 (six) hours as needed for severe pain (pain score 7-10). 10/14/23   Wieting, Richard, MD  polyethylene glycol (MIRALAX / GLYCOLAX) 17 g packet Take 17 g by mouth daily. 10/14/23   Alford Highland, MD  sacubitril-valsartan (ENTRESTO) 24-26 MG Take 1 tablet by mouth 2 (two) times daily. 10/23/23   Delma Freeze, FNP  spironolactone (ALDACTONE) 25 MG tablet Take 1 tablet (25 mg total) by mouth daily. 08/30/23 12/18/23  Sabharwal, Aditya, DO  tamsulosin (FLOMAX) 0.4 MG CAPS capsule Take 1 capsule (0.4 mg total) by mouth daily after supper. 10/14/23   Alford Highland, MD  Vibegron (GEMTESA) 75 MG TABS Take 1 tablet (75 mg total) by mouth daily. 10/23/23   Carman Ching, PA-C    Physical Exam: Vitals:   11/04/23 2000 11/04/23 2030 11/04/23 2130 11/04/23 2204  BP: 117/61 (!) 99/56 109/65 127/74  Pulse: 78 60 71  63  Resp: 20 16 16 14   Temp:    97.9 F (36.6 C)  TempSrc:    Oral  SpO2: 98% 92% 99% 98%   General: Not in acute distress HEENT:       Eyes: PERRL, EOMI, no jaundice       ENT: No discharge from the ears and nose, no pharynx injection, no tonsillar enlargement.        Neck: No JVD, no bruit, no mass felt. Heme: No neck lymph node enlargement. Cardiac: S1/S2, RRR, No murmurs, No gallops or rubs. Respiratory: No rales, wheezing, rhonchi or rubs. GI: Soft, nondistended, has right upper quadrant tenderness, no rebound pain, no organomegaly, BS present. GU: has right CVA tenderness. Ext: No pitting leg edema bilaterally. 1+DP/PT pulse  bilaterally. Musculoskeletal: No joint deformities, No joint redness or warmth, no limitation of ROM in spin. Skin: No rashes.  Neuro: Alert, oriented X3, cranial nerves II-XII grossly intact, moves all extremities normally.  Psych: Patient is not psychotic, no suicidal or hemocidal ideation.  Labs on Admission: I have personally reviewed following labs and imaging studies  CBC: Recent Labs  Lab 11/04/23 1402  WBC 5.4  NEUTROABS 3.8  HGB 9.0*  HCT 28.1*  MCV 92.4  PLT 145*   Basic Metabolic Panel: Recent Labs  Lab 11/04/23 1402  NA 136  K 3.7  CL 110  CO2 19*  GLUCOSE 142*  BUN 41*  CREATININE 1.32*  CALCIUM 7.7*   GFR: Estimated Creatinine Clearance: 61.7 mL/min (A) (by C-G formula based on SCr of 1.32 mg/dL (H)). Liver Function Tests: Recent Labs  Lab 11/04/23 1402  AST 24  ALT 19  ALKPHOS 71  BILITOT 0.4  PROT 6.0*  ALBUMIN 3.3*   Recent Labs  Lab 11/04/23 1402  LIPASE 30   No results for input(s): "AMMONIA" in the last 168 hours. Coagulation Profile: Recent Labs  Lab 11/04/23 2307  INR 1.1   Cardiac Enzymes: No results for input(s): "CKTOTAL", "CKMB", "CKMBINDEX", "TROPONINI" in the last 168 hours. BNP (last 3 results) No results for input(s): "PROBNP" in the last 8760 hours. HbA1C: No results for input(s): "HGBA1C" in the last 72 hours. CBG: No results for input(s): "GLUCAP" in the last 168 hours. Lipid Profile: No results for input(s): "CHOL", "HDL", "LDLCALC", "TRIG", "CHOLHDL", "LDLDIRECT" in the last 72 hours. Thyroid Function Tests: No results for input(s): "TSH", "T4TOTAL", "FREET4", "T3FREE", "THYROIDAB" in the last 72 hours. Anemia Panel: No results for input(s): "VITAMINB12", "FOLATE", "FERRITIN", "TIBC", "IRON", "RETICCTPCT" in the last 72 hours. Urine analysis:    Component Value Date/Time   COLORURINE YELLOW (A) 11/04/2023 1408   APPEARANCEUR HAZY (A) 11/04/2023 1408   APPEARANCEUR Cloudy (A) 10/23/2023 1417   LABSPEC 1.013  11/04/2023 1408   PHURINE 5.0 11/04/2023 1408   GLUCOSEU NEGATIVE 11/04/2023 1408   HGBUR LARGE (A) 11/04/2023 1408   BILIRUBINUR NEGATIVE 11/04/2023 1408   BILIRUBINUR Negative 10/23/2023 1417   KETONESUR NEGATIVE 11/04/2023 1408   PROTEINUR 100 (A) 11/04/2023 1408   NITRITE NEGATIVE 11/04/2023 1408   LEUKOCYTESUR LARGE (A) 11/04/2023 1408   Sepsis Labs: @LABRCNTIP (procalcitonin:4,lacticidven:4) ) Recent Results (from the past 240 hours)  SARS Coronavirus 2 by RT PCR (hospital order, performed in [facility] hospital lab) *cepheid single result test* Anterior Nasal Swab     Status: None   Collection Time: 11/04/23 11:35 PM   Specimen: Anterior Nasal Swab  Result Value Ref Range Status   SARS Coronavirus 2 by RT PCR NEGATIVE NEGATIVE Final  Comment: (NOTE) SARS-CoV-2 target nucleic acids are NOT DETECTED.  The SARS-CoV-2 RNA is generally detectable in upper and lower respiratory specimens during the acute phase of infection. The lowest concentration of SARS-CoV-2 viral copies this assay can detect is 250 copies / mL. A negative result does not preclude SARS-CoV-2 infection and should not be used as the sole basis for treatment or other patient management decisions.  A negative result may occur with improper specimen collection / handling, submission of specimen other than nasopharyngeal swab, presence of viral mutation(s) within the areas targeted by this assay, and inadequate number of viral copies (<250 copies / mL). A negative result must be combined with clinical observations, patient history, and epidemiological information.  Fact Sheet for Patients:   RoadLapTop.co.za  Fact Sheet for Healthcare Providers: http://kim-miller.com/  This test is not yet approved or  cleared by the [place] FDA and has been authorized for detection and/or diagnosis of SARS-CoV-2 by FDA under an Emergency Use Authorization (EUA).  This EUA  will remain in effect (meaning this test can be used) for the duration of the COVID-19 declaration under Section 564(b)(1) of the Act, 21 U.S.C. section 360bbb-3(b)(1), unless the authorization is terminated or revoked sooner.  Performed at [facility], [address], [place], [place] [address]      Radiological Exams on Admission: US ABDOMEN LIMITED RUQ (LIVER/GB) Result Date: 11/04/2023 CLINICAL DATA:  [address] Gallstone [address]. EXAM: ULTRASOUND ABDOMEN LIMITED RIGHT UPPER QUADRANT COMPARISON:  CT abdomen/pelvis 11/04/2023. FINDINGS: Gallbladder: Large shadowing stone in the fundus. Smaller mobile stone near the neck. Positive sonographic Murphy's sign reported by the sonographer. Borderline thickening of the gallbladder wall at the fundus. No pericholecystic fluid. Common bile duct: Diameter: 2.0 mm. Liver: No focal lesion identified. Within normal limits in parenchymal echogenicity. Portal vein is patent on color Doppler imaging with normal direction of blood flow towards the liver. Other: Partially imaged right renal calculus is better evaluated on same-day abdominal CT. IMPRESSION: Cholelithiasis with positive sonographic Murphy's sign and borderline thickening of the gallbladder wall at the fundus. Findings are suspicious for acute cholecystitis. Electronically Signed   By: Orvan Falconer M.D.   On: 11/04/2023 19:07   CT Angio Chest PE W and/or Wo Contrast Result Date: 11/04/2023 CLINICAL DATA:  Pulmonary embolism (PE) suspected, high prob. Cough, congestion EXAM: CT ANGIOGRAPHY CHEST WITH CONTRAST TECHNIQUE: Multidetector CT imaging of the chest was performed using the standard protocol during bolus administration of intravenous contrast. Multiplanar CT image reconstructions and MIPs were obtained to evaluate the vascular anatomy. RADIATION DOSE REDUCTION: This exam was performed according to the departmental dose-optimization program which includes automated exposure control,  adjustment of the mA and/or kV according to patient size and/or use of iterative reconstruction technique. CONTRAST:  75mL OMNIPAQUE IOHEXOL 350 MG/ML SOLN COMPARISON:  04/28/2023 FINDINGS: Cardiovascular: No filling defects in the pulmonary arteries to suggest pulmonary emboli. Heart is normal size. Aorta is normal caliber. Scattered coronary artery and aortic atherosclerosis. Mediastinum/Nodes: No mediastinal, hilar, or axillary adenopathy. Trachea and esophagus are unremarkable. Thyroid unremarkable. Large hiatal hernia. Lungs/Pleura: Compressive atelectasis in the left lower lobe adjacent to the hiatal hernia. No additional confluent opacities or effusions. Upper Abdomen: Mild right hydronephrosis with right renal pelvic and proximal ureteral stones. Right upper pole staghorn calculus. Ureteral stent partially visualized. Gallstones noted in the gallbladder. Musculoskeletal: Chest wall soft tissues are unremarkable. No acute bony abnormality. Review of the MIP images confirms the above findings. IMPRESSION: No evidence of pulmonary embolus. Large  hiatal hernia. Right hydronephrosis with right ureteral stent in place. Multiple right renal pelvic and proximal ureteral stones present. Upper pole staghorn calculus. Cholelithiasis. Aortic Atherosclerosis (ICD10-I70.0). Electronically Signed   By: Charlett Nose M.D.   On: 11/04/2023 18:26   CT Cervical Spine Wo Contrast Result Date: 11/04/2023 CLINICAL DATA:  Neck trauma.  Cough congestion 2 weeks. EXAM: CT CERVICAL SPINE WITHOUT CONTRAST TECHNIQUE: Multidetector CT imaging of the cervical spine was performed without intravenous contrast. Multiplanar CT image reconstructions were also generated. RADIATION DOSE REDUCTION: This exam was performed according to the departmental dose-optimization program which includes automated exposure control, adjustment of the mA and/or kV according to patient size and/or use of iterative reconstruction technique. COMPARISON:  CT of  the cervical spine 06/01/09 FINDINGS: Alignment: No significant listhesis is present. The cervical lordosis is preserved. Skull base and vertebrae: Craniocervical junction is normal. Vertebral body heights normal. No acute fractures are present. Soft tissues and spinal canal: No prevertebral fluid or swelling. No visible canal hematoma. Disc levels: Progressive endplate changes and uncovertebral spurring C6-7 results in severe right and moderate left foraminal stenosis. Mild foraminal narrowing is present bilaterally at C5-6. Asymmetric right-sided facet hypertrophy contributes to moderate right foraminal narrowing. Upper chest: The lung apices are clear. The thoracic inlet is within normal limits. IMPRESSION: 1. No acute fracture or traumatic subluxation. 2. Progressive endplate changes and uncovertebral spurring at C6-7 results in severe right and moderate left foraminal stenosis. 3. Mild foraminal narrowing bilaterally at C5-6. 4. Asymmetric right-sided facet hypertrophy contributes to moderate right foraminal narrowing at C5-6. Electronically Signed   By: Marin Roberts M.D.   On: 11/04/2023 17:29   CT HEAD WO CONTRAST ( ) Result Date: 11/04/2023 CLINICAL DATA:  Head trauma, minor. EXAM: CT HEAD WITHOUT CONTRAST TECHNIQUE: Contiguous axial images were obtained from the base of the skull through the vertex without intravenous contrast. RADIATION DOSE REDUCTION: This exam was performed according to the departmental dose-optimization program which includes automated exposure control, adjustment of the mA and/or kV according to patient size and/or use of iterative reconstruction technique. COMPARISON:  CT of the head without contrast 08/01/2011 FINDINGS: Brain: No acute infarct, hemorrhage, or mass lesion is present. Periventricular white matter hypoattenuation has advanced since prior exam acute hemorrhage or mass lesion is present. The ventricles are of normal size. No significant extraaxial fluid  collection is present. The brainstem and cerebellum are within normal limits. Midline structures are within normal limits. Vascular: Calcifications are present within the cavernous internal carotid arteries bilaterally. No hyperdense vessel is present. Skull: Calvarium is intact. No focal lytic or blastic lesions are present. Left supraorbital scalp soft tissue swelling is present without underlying fracture. Sinuses/Orbits: The paranasal sinuses and mastoid air cells are clear. The globes and orbits are within normal limits. IMPRESSION: 1. Left supraorbital scalp soft tissue swelling without underlying fracture. 2. No acute intracranial abnormality. 3. Periventricular white matter hypoattenuation has advanced since prior exam. This likely reflects the sequela of chronic microvascular ischemia. Electronically Signed   By: Marin Roberts M.D.   On: 11/04/2023 17:26   DG Chest 2 View Result Date: 11/04/2023 CLINICAL DATA:  Cough. EXAM: CHEST - 2 VIEW COMPARISON:  10/10/2023 FINDINGS: The heart size and mediastinal contours are within normal limits. Both lungs are clear. Moderate to large hiatal hernia again noted. IMPRESSION: No active cardiopulmonary disease. Moderate to large hiatal hernia. Electronically Signed   By: Danae Orleans M.D.   On: 11/04/2023 14:47   CT Renal Stone Study Result  Date: 11/04/2023 CLINICAL DATA:  Right flank pain with radiation to right groin. Nephrolithiasis. Right ureteral stent. EXAM: CT ABDOMEN AND PELVIS WITHOUT CONTRAST TECHNIQUE: Multidetector CT imaging of the abdomen and pelvis was performed following the standard protocol without IV contrast. RADIATION DOSE REDUCTION: This exam was performed according to the departmental dose-optimization program which includes automated exposure control, adjustment of the mA and/or kV according to patient size and/or use of iterative reconstruction technique. COMPARISON:  10/10/2023 FINDINGS: Lower chest: No acute findings.  Hepatobiliary: No mass visualized on this unenhanced exam. Gallstones are seen, however there is no evidence of cholecystitis or biliary dilatation. Pancreas: No mass or inflammatory process visualized on this unenhanced exam. Spleen:  Within normal limits in size. Adrenals/Urinary tract: Stable adrenal glands. Diffuse left renal parenchymal atrophy again noted. Multiple renal calculi are again seen bilaterally, some of which have a staghorn appearance. 8 mm calculus again seen in the proximal to mid left ureter, with mild left hydronephrosis which has decreased since previous study. A right ureteral stent is now seen in appropriate position. A 14 mm calculus is again seen in the right renal pelvis. 7 mm calculus is again seen in the right mid ureter adjacent to the ureteral stent. Mild right hydronephrosis shows slight decrease since prior study. Unremarkable unopacified urinary bladder. Stomach/Bowel: Large hiatal hernia is again noted. No evidence of obstruction, inflammatory process, or abnormal fluid collections. Vascular/Lymphatic: No pathologically enlarged lymph nodes identified. No evidence of abdominal aortic aneurysm. Reproductive:  Stable mildly enlarged prostate. Other:  None. Musculoskeletal:  No suspicious bone lesions identified. IMPRESSION: Interval placement of right ureteral stent in appropriate position. Persistent calculi in the mid right ureter, right renal pelvis, and proximal to mid left ureter. Mild bilateral hydronephrosis shows mild decrease since prior study. Bilateral nephrolithiasis including partial staghorn calculi. Cholelithiasis. No radiographic evidence of cholecystitis. Large hiatal hernia. Stable mildly enlarged prostate. Electronically Signed   By: Danae Orleans M.D.   On: 11/04/2023 14:45      Assessment/Plan Principal Problem:   Right flank pain Active Problems:   Acute pyelonephritis   Myocardial injury   CAD (coronary artery disease)   Essential hypertension    Chronic systolic CHF (congestive heart failure) (HCC)   Chronic kidney disease, stage 3b (HCC)   HLD (hyperlipidemia)   Normocytic anemia   Fall at home, initial encounter   Ureteral stone with hydronephrosis   Cough   Assessment and Plan:  Right flank pain: Likely due to acute pyelonephritis and possible cholecystitis.  Urinalysis positive for infection.  US-RUQ showed cholelithiasis with positive sonographic Murphy's sign and borderline thickening of the gallbladder wall at the fundus, suspicious for acute cholecystitis. EDP consulted Dr. Aleen Campi, who recommended to get HIDA scan.  -Admitted to telemetry bed as inpatient -Started Zosyn IV -Follow-up of blood culture and urine culture -As needed morphine, Percocet, Tylenol for pain -HIDA scan is ordered  Acute pyelonephritis -On Zosyn -Follow-up urine culture  Myocardial injury and hx of CAD (coronary artery disease): Troponin 19 --> 18.  -Aspirin, Lipitor  Essential hypertension -IV hydralazine as needed -Lasix metoprolol, Entresto, spironolactone  Chronic systolic CHF (congestive heart failure) (HCC): 2D echo 10/13/2023 showed EF 25-30%.  Patient does not have leg edema or JVD.  BNP 198.6, does not seem to have CHF exacerbation. -Continue home Lasix and spironolactone, and Entresto  Chronic kidney disease, stage 3b (HCC): Renal function stable -Follow-up with BMP  HLD (hyperlipidemia) -Lipitor  Normocytic anemia: Hemoglobin stable 9.0 (9.0 on 10/14/2023) -Follow-up with  CBC  Fall at home, initial encounter: No acute injury -Fall precaution -PT/OT  Right ureteral stone with hydronephrosis: S/p of stent placement.  ED physician discussed with Dr. Marlou Porch of urology.  He recommended to continue Flomax -Treat pyelonephritis as above -Flomax, mirabegron  Cough: Etiology is not clear.  Chest x-ray negative.  No wheezing on my examination.  Patient received 1 dose of Solu-Medrol 125 mg in ED. -Bronchodilators -Check  COVID 19 -As needed Mucinex for cough       DVT ppx: SQ Heparin        Code Status: Full code    Family Communication: not done, no family member is at bed side.         Disposition Plan:  Anticipate discharge back to previous environment  Consults called:  Dr. Aleen Campi of surgery is consulted by EDP.  EDP also discussed with urologist, Dr. Marlou Porch,   Admission status and Level of care: Telemetry Medical:  as inpt      Dispo: The patient is from: Home              Anticipated d/c is to: Home              Anticipated d/c date is: 2 days              Patient currently is not medically stable to d/c.    Severity of Illness:  The appropriate patient status for this patient is INPATIENT. Inpatient status is judged to be reasonable and necessary in order to provide the required intensity of service to ensure the patient's safety. The patient's presenting symptoms, physical exam findings, and initial radiographic and laboratory data in the context of their chronic comorbidities is felt to place them at high risk for further clinical deterioration. Furthermore, it is not anticipated that the patient will be medically stable for discharge from the hospital within 2 midnights of admission.   * I certify that at the point of admission it is my clinical judgment that the patient will require inpatient hospital care spanning beyond 2 midnights from the point of admission due to high intensity of service, high risk for further deterioration and high frequency of surveillance required.*       Date of Service 11/05/2023    Lorretta Harp [facility]   If 7PM-7AM, please contact night-coverage [email or web address] 11/05/2023, 1:23 AM

## 2023-11-04 NOTE — Progress Notes (Signed)
Pharmacy Antibiotic Note  Ronnie Mann is a 67 y.o. male admitted on 11/04/2023 with possible cholecystitis.  Pharmacy has been consulted for Zosyn dosing.  Plan: Zosyn 3.375g IV q8h (4 hour infusion).  Pharmacy will continue to follow and will adjust abx dosing whenever warranted.  Temp (24hrs), Avg:98.3 F (36.8 C), Min:97.9 F (36.6 C), Max:98.7 F (37.1 C)   Recent Labs  Lab 11/04/23 1402  WBC 5.4  CREATININE 1.32*    Estimated Creatinine Clearance: 61.7 mL/min (A) (by C-G formula based on SCr of 1.32 mg/dL (H)).    Allergies  Allergen Reactions   Losartan Other (See Comments)    Chest pain    Antimicrobials this admission: 12/14 Zosyn >>   Microbiology results: 12/14 BCx: Pending 12/14 UCx: Pending   Thank you for allowing pharmacy to be a part of this patient's care.  Otelia Sergeant, PharmD, [facility] 11/04/2023 10:44 PM

## 2023-11-05 ENCOUNTER — Inpatient Hospital Stay: Payer: Medicare HMO

## 2023-11-05 DIAGNOSIS — W19XXXA Unspecified fall, initial encounter: Secondary | ICD-10-CM | POA: Diagnosis not present

## 2023-11-05 DIAGNOSIS — N1832 Chronic kidney disease, stage 3b: Secondary | ICD-10-CM | POA: Diagnosis not present

## 2023-11-05 DIAGNOSIS — R1031 Right lower quadrant pain: Secondary | ICD-10-CM | POA: Diagnosis not present

## 2023-11-05 DIAGNOSIS — N132 Hydronephrosis with renal and ureteral calculous obstruction: Secondary | ICD-10-CM | POA: Diagnosis not present

## 2023-11-05 DIAGNOSIS — R109 Unspecified abdominal pain: Secondary | ICD-10-CM | POA: Diagnosis not present

## 2023-11-05 DIAGNOSIS — N1330 Unspecified hydronephrosis: Secondary | ICD-10-CM

## 2023-11-05 LAB — BASIC METABOLIC PANEL
Anion gap: 7 (ref 5–15)
BUN: 47 mg/dL — ABNORMAL HIGH (ref 8–23)
CO2: 22 mmol/L (ref 22–32)
Calcium: 9 mg/dL (ref 8.9–10.3)
Chloride: 104 mmol/L (ref 98–111)
Creatinine, Ser: 1.55 mg/dL — ABNORMAL HIGH (ref 0.61–1.24)
GFR, Estimated: 49 mL/min — ABNORMAL LOW (ref >60)
Glucose, Bld: 277 mg/dL — ABNORMAL HIGH (ref 70–99)
Potassium: 5.3 mmol/L — ABNORMAL HIGH (ref 3.5–5.1)
Sodium: 133 mmol/L — ABNORMAL LOW (ref 135–145)

## 2023-11-05 LAB — CBC
HCT: 31.8 % — ABNORMAL LOW (ref 39.0–52.0)
Hemoglobin: 10.2 g/dL — ABNORMAL LOW (ref 13.0–17.0)
MCH: 29.5 pg (ref 26.0–34.0)
MCHC: 32.1 g/dL (ref 30.0–36.0)
MCV: 91.9 fL (ref 80.0–100.0)
Platelets: 163 10*3/uL (ref 150–400)
RBC: 3.46 MIL/uL — ABNORMAL LOW (ref 4.22–5.81)
RDW: 16.6 % — ABNORMAL HIGH (ref 11.5–15.5)
WBC: 4.3 10*3/uL (ref 4.0–10.5)
nRBC: 0 % (ref 0.0–0.2)

## 2023-11-05 LAB — SARS CORONAVIRUS 2 BY RT PCR: SARS Coronavirus 2 by RT PCR: NEGATIVE

## 2023-11-05 MED ORDER — TECHNETIUM TC 99M MEBROFENIN IV KIT
5.0000 | PACK | Freq: Once | INTRAVENOUS | Status: AC | PRN
  Administered 2023-11-05: 5.2 via INTRAVENOUS

## 2023-11-05 MED ORDER — METOPROLOL SUCCINATE ER 25 MG PO TB24
25.0000 mg | ORAL_TABLET | Freq: Every day | ORAL | Status: DC
  Administered 2023-11-05 – 2023-11-06 (×2): 25 mg via ORAL
  Filled 2023-11-05 (×2): qty 1

## 2023-11-05 MED ORDER — MIRABEGRON ER 25 MG PO TB24
25.0000 mg | ORAL_TABLET | Freq: Every day | ORAL | Status: DC
Start: 2023-11-05 — End: 2023-11-06
  Administered 2023-11-05 – 2023-11-06 (×2): 25 mg via ORAL
  Filled 2023-11-05 (×2): qty 1

## 2023-11-05 MED ORDER — ATORVASTATIN CALCIUM 20 MG PO TABS
40.0000 mg | ORAL_TABLET | Freq: Every day | ORAL | Status: DC
  Administered 2023-11-05 – 2023-11-06 (×2): 40 mg via ORAL
  Filled 2023-11-05 (×2): qty 2

## 2023-11-05 MED ORDER — ACETAMINOPHEN 10 MG/ML IV SOLN
1000.0000 mg | Freq: Once | INTRAVENOUS | Status: AC
Start: 2023-11-05 — End: 2023-11-05
  Administered 2023-11-05: 1000 mg via INTRAVENOUS
  Filled 2023-11-05: qty 100

## 2023-11-05 MED ORDER — SACUBITRIL-VALSARTAN 24-26 MG PO TABS
1.0000 | ORAL_TABLET | Freq: Two times a day (BID) | ORAL | Status: DC
Start: 2023-11-05 — End: 2023-11-05
  Filled 2023-11-05: qty 1

## 2023-11-05 MED ORDER — FUROSEMIDE 20 MG PO TABS
20.0000 mg | ORAL_TABLET | Freq: Every day | ORAL | Status: DC
  Administered 2023-11-05 – 2023-11-06 (×2): 20 mg via ORAL
  Filled 2023-11-05 (×2): qty 1

## 2023-11-05 MED ORDER — SPIRONOLACTONE 25 MG PO TABS: 25.0000 mg | ORAL_TABLET | Freq: Every day | ORAL | Status: DC

## 2023-11-05 MED ORDER — ASPIRIN 81 MG PO TBEC
81.0000 mg | DELAYED_RELEASE_TABLET | Freq: Every day | ORAL | Status: DC
  Administered 2023-11-05 – 2023-11-06 (×2): 81 mg via ORAL
  Filled 2023-11-05 (×2): qty 1

## 2023-11-05 MED ORDER — HYDROMORPHONE HCL 1 MG/ML IJ SOLN
0.5000 mg | INTRAMUSCULAR | Status: DC | PRN
  Administered 2023-11-05 – 2023-11-06 (×3): 0.5 mg via INTRAVENOUS
  Filled 2023-11-05 (×3): qty 0.5

## 2023-11-05 MED ORDER — ALBUTEROL SULFATE (2.5 MG/3ML) 0.083% IN NEBU: 2.5000 mg | INHALATION_SOLUTION | RESPIRATORY_TRACT | Status: DC | PRN

## 2023-11-05 NOTE — ED Notes (Signed)
Pt still in scan

## 2023-11-05 NOTE — ED Notes (Signed)
Called pharmacy to verify tylenol drip.

## 2023-11-05 NOTE — ED Notes (Signed)
Pt went for HIDA scan.

## 2023-11-05 NOTE — Evaluation (Signed)
Physical Therapy Evaluation Patient Details Name: Ronnie Mann MRN: [id number] DOB: 1956/09/11 Today's Date: 11/05/2023  History of Present Illness  Ronnie Mann is a 67 y.o. male with medical history significant of right hydronephrosis and right ureteral stone (s/p of recent stent placement), sCHF with EF 25%, HTN, HLD, CAD with stent, CKD-3a, anemia, hiatal hernia, obesity, who presents with right flank pain.  Clinical Impression  The pt presents with a great willingness to participate in PT. He reports "7.5/10" pain but reports that it is tolerable for mobility. Although mobility limited this session, he is expected to progress to d/c home with intermittent family care and HHPT once medically stable. PT will continue to follow to address functional mobility.         If plan is discharge home, recommend the following: A little help with walking and/or transfers;A little help with bathing/dressing/bathroom   Can travel by private vehicle        Equipment Recommendations    Recommendations for Other Services       Functional Status Assessment Patient has had a recent decline in their functional status and demonstrates the ability to make significant improvements in function in a reasonable and predictable amount of time.     Precautions / Restrictions Precautions Precautions: None Restrictions Weight Bearing Restrictions Per Provider Order: No      Mobility  Bed Mobility Overal bed mobility: Needs Assistance Bed Mobility: Supine to Sit     Supine to sit: HOB elevated, Modified independent (Device/Increase time)          Transfers Overall transfer level: Needs assistance Equipment used: 1 person hand held assist Transfers: Sit to/from Stand Sit to Stand: Supervision                Ambulation/Gait Ambulation/Gait assistance: Supervision Gait Distance (Feet): 10 Feet Assistive device: 1 person hand held assist            Stairs             Wheelchair Mobility     Tilt Bed    Modified Rankin (Stroke Patients Only)       Balance Overall balance assessment: Needs assistance   Sitting balance-Leahy Scale: Good     Standing balance support: During functional activity, Single extremity supported Standing balance-Leahy Scale: Fair                               Pertinent Vitals/Pain Pain Assessment Pain Assessment: 0-10 Pain Score: 7  Pain Location: abdomen Pain Descriptors / Indicators: Aching Pain Intervention(s): Limited activity within patient's tolerance, Monitored during session    Home Living Family/patient expects to be discharged to:: Private residence Living Arrangements: Children Available Help at Discharge: Family;Available PRN/intermittently Type of Home: House Home Access: Stairs to enter   [employer] of Steps: 2   Home Layout: One level Home Equipment: Cane - single point      Prior Function Prior Level of Function : Independent/Modified Independent;Driving             Mobility Comments: son helps only when the patient needs it. He reports driving only when not on medication that makes him drowsy       Extremity/Trunk Assessment   Upper Extremity Assessment Upper Extremity Assessment: Overall WFL for tasks assessed    Lower Extremity Assessment Lower Extremity Assessment: Overall WFL for tasks assessed       Communication  Cognition Arousal: Alert Behavior During Therapy: WFL for tasks assessed/performed Overall Cognitive Status: Within Functional Limits for tasks assessed                                          General Comments      Exercises     Assessment/Plan    PT Assessment Patient needs continued PT services  PT Problem List Decreased activity tolerance;Decreased mobility;Decreased balance       PT Treatment Interventions Gait training;Stair training;Functional mobility training;Therapeutic  activities;Therapeutic exercise;Balance training    PT Goals (Current goals can be found in the Care Plan section)  Acute Rehab PT Goals Patient Stated Goal: Return home with family support PT Goal Formulation: With patient Time For Goal Achievement: 11/12/23 Potential to Achieve Goals: Good    Frequency Min 2X/week     Co-evaluation               AM-PAC PT "6 Clicks" Mobility  Outcome Measure Help needed turning from your back to your side while in a flat bed without using bedrails?: A Little Help needed moving from lying on your back to sitting on the side of a flat bed without using bedrails?: A Little Help needed moving to and from a bed to a chair (including a wheelchair)?: A Little Help needed standing up from a chair using your arms (e.g., wheelchair or bedside chair)?: A Little Help needed to walk in hospital room?: A Little Help needed climbing 3-5 steps with a railing? : A Lot 6 Click Score: 17    End of Session   Activity Tolerance: Patient tolerated treatment well;No increased pain Patient left: in bed;with bed alarm set Nurse Communication: Mobility status PT Visit Diagnosis: Unsteadiness on feet (R26.81);Pain Pain - part of body:  (abdomen)    Time: 1610-9604 PT Time Calculation (min) (ACUTE ONLY): 18 min   Charges:   PT Evaluation $PT Eval Low Complexity: 1 Low   PT General Charges $$ ACUTE PT VISIT: 1 Visit         3:30 PM, 11/05/23 Cantrell Larouche A. Mordecai Maes PT, DPT Physical Therapist - [facility]   Netta Fodge A Nina Hoar 11/05/2023, 3:23 PM

## 2023-11-05 NOTE — Progress Notes (Signed)
11/05/2023  Subjective: Patient still having pain in right flank and side.  Had to hold narcotics overnight in preparation for HIDA scan.  Denies any nausea.  WBC remains normal.  Vital signs: Temp:  [97.8 F (36.6 C)-98.7 F (37.1 C)] 97.8 F (36.6 C) (12/15 0626) Pulse Rate:  [60-89] 82 (12/15 1225) Resp:  [12-25] 21 (12/15 1225) BP: (95-127)/(56-88) 124/78 (12/15 1225) SpO2:  [92 %-99 %] 98 % (12/15 1225)   Intake/Output: 12/14 0701 - 12/15 0700 In: 150 [IV Piggyback:150] Out: 425 [Urine:425] Last BM Date : 11/04/23  Physical Exam: Constitutional: No acute distress Abdomen:  soft, non-distended, with tenderness in right flank extending towards right groin.  Negative Murphy's sign.  Labs:  Recent Labs    11/04/23 1402 11/05/23 0441  WBC 5.4 4.3  HGB 9.0* 10.2*  HCT 28.1* 31.8*  PLT 145* 163   Recent Labs    11/04/23 1402 11/05/23 0441  NA 136 133*  K 3.7 5.3*  CL 110 104  CO2 19* 22  GLUCOSE 142* 277*  BUN 41* 47*  CREATININE 1.32* 1.55*  CALCIUM 7.7* 9.0   Recent Labs    11/04/23 2307  LABPROT 14.7  INR 1.1    Imaging: NM Hepatobiliary Liver Func Result Date: 11/05/2023 CLINICAL DATA:  Right upper quadrant pain and right flank pain. Gallstones. EXAM: NUCLEAR MEDICINE HEPATOBILIARY IMAGING TECHNIQUE: Sequential images of the abdomen were obtained out to 60 minutes following intravenous administration of radiopharmaceutical. RADIOPHARMACEUTICALS:  5.2 mCi Tc-1m  Choletec IV COMPARISON:  None Available. FINDINGS: Prompt uptake and biliary excretion of activity by the liver is seen. Gallbladder activity is visualized, consistent with patency of cystic duct. Biliary activity passes into small bowel, consistent with patent common bile duct. IMPRESSION: Normal exam, demonstrating patency of cystic and common bile ducts. Electronically Signed   By: Danae Orleans M.D.   On: 11/05/2023 12:12   US ABDOMEN LIMITED RUQ (LIVER/GB) Result Date: 11/04/2023 CLINICAL  DATA:  11914 Gallstone 78295. EXAM: ULTRASOUND ABDOMEN LIMITED RIGHT UPPER QUADRANT COMPARISON:  CT abdomen/pelvis 11/04/2023. FINDINGS: Gallbladder: Large shadowing stone in the fundus. Smaller mobile stone near the neck. Positive sonographic Murphy's sign reported by the sonographer. Borderline thickening of the gallbladder wall at the fundus. No pericholecystic fluid. Common bile duct: Diameter: 2.0 mm. Liver: No focal lesion identified. Within normal limits in parenchymal echogenicity. Portal vein is patent on color Doppler imaging with normal direction of blood flow towards the liver. Other: Partially imaged right renal calculus is better evaluated on same-day abdominal CT. IMPRESSION: Cholelithiasis with positive sonographic Murphy's sign and borderline thickening of the gallbladder wall at the fundus. Findings are suspicious for acute cholecystitis. Electronically Signed   By: Orvan Falconer M.D.   On: 11/04/2023 19:07   CT Angio Chest PE W and/or Wo Contrast Result Date: 11/04/2023 CLINICAL DATA:  Pulmonary embolism (PE) suspected, high prob. Cough, congestion EXAM: CT ANGIOGRAPHY CHEST WITH CONTRAST TECHNIQUE: Multidetector CT imaging of the chest was performed using the standard protocol during bolus administration of intravenous contrast. Multiplanar CT image reconstructions and MIPs were obtained to evaluate the vascular anatomy. RADIATION DOSE REDUCTION: This exam was performed according to the departmental dose-optimization program which includes automated exposure control, adjustment of the mA and/or kV according to patient size and/or use of iterative reconstruction technique. CONTRAST:  75mL OMNIPAQUE IOHEXOL 350 MG/ML SOLN COMPARISON:  04/28/2023 FINDINGS: Cardiovascular: No filling defects in the pulmonary arteries to suggest pulmonary emboli. Heart is normal size. Aorta is normal caliber. Scattered coronary  artery and aortic atherosclerosis. Mediastinum/Nodes: No mediastinal, hilar, or  axillary adenopathy. Trachea and esophagus are unremarkable. Thyroid unremarkable. Large hiatal hernia. Lungs/Pleura: Compressive atelectasis in the left lower lobe adjacent to the hiatal hernia. No additional confluent opacities or effusions. Upper Abdomen: Mild right hydronephrosis with right renal pelvic and proximal ureteral stones. Right upper pole staghorn calculus. Ureteral stent partially visualized. Gallstones noted in the gallbladder. Musculoskeletal: Chest wall soft tissues are unremarkable. No acute bony abnormality. Review of the MIP images confirms the above findings. IMPRESSION: No evidence of pulmonary embolus. Large hiatal hernia. Right hydronephrosis with right ureteral stent in place. Multiple right renal pelvic and proximal ureteral stones present. Upper pole staghorn calculus. Cholelithiasis. Aortic Atherosclerosis (ICD10-I70.0). Electronically Signed   By: Charlett Nose M.D.   On: 11/04/2023 18:26   CT Cervical Spine Wo Contrast Result Date: 11/04/2023 CLINICAL DATA:  Neck trauma.  Cough congestion 2 weeks. EXAM: CT CERVICAL SPINE WITHOUT CONTRAST TECHNIQUE: Multidetector CT imaging of the cervical spine was performed without intravenous contrast. Multiplanar CT image reconstructions were also generated. RADIATION DOSE REDUCTION: This exam was performed according to the departmental dose-optimization program which includes automated exposure control, adjustment of the mA and/or kV according to patient size and/or use of iterative reconstruction technique. COMPARISON:  CT of the cervical spine 06/01/09 FINDINGS: Alignment: No significant listhesis is present. The cervical lordosis is preserved. Skull base and vertebrae: Craniocervical junction is normal. Vertebral body heights normal. No acute fractures are present. Soft tissues and spinal canal: No prevertebral fluid or swelling. No visible canal hematoma. Disc levels: Progressive endplate changes and uncovertebral spurring C6-7 results in  severe right and moderate left foraminal stenosis. Mild foraminal narrowing is present bilaterally at C5-6. Asymmetric right-sided facet hypertrophy contributes to moderate right foraminal narrowing. Upper chest: The lung apices are clear. The thoracic inlet is within normal limits. IMPRESSION: 1. No acute fracture or traumatic subluxation. 2. Progressive endplate changes and uncovertebral spurring at C6-7 results in severe right and moderate left foraminal stenosis. 3. Mild foraminal narrowing bilaterally at C5-6. 4. Asymmetric right-sided facet hypertrophy contributes to moderate right foraminal narrowing at C5-6. Electronically Signed   By: Marin Roberts M.D.   On: 11/04/2023 17:29   CT HEAD WO CONTRAST ( ) Result Date: 11/04/2023 CLINICAL DATA:  Head trauma, minor. EXAM: CT HEAD WITHOUT CONTRAST TECHNIQUE: Contiguous axial images were obtained from the base of the skull through the vertex without intravenous contrast. RADIATION DOSE REDUCTION: This exam was performed according to the departmental dose-optimization program which includes automated exposure control, adjustment of the mA and/or kV according to patient size and/or use of iterative reconstruction technique. COMPARISON:  CT of the head without contrast 08/01/2011 FINDINGS: Brain: No acute infarct, hemorrhage, or mass lesion is present. Periventricular white matter hypoattenuation has advanced since prior exam acute hemorrhage or mass lesion is present. The ventricles are of normal size. No significant extraaxial fluid collection is present. The brainstem and cerebellum are within normal limits. Midline structures are within normal limits. Vascular: Calcifications are present within the cavernous internal carotid arteries bilaterally. No hyperdense vessel is present. Skull: Calvarium is intact. No focal lytic or blastic lesions are present. Left supraorbital scalp soft tissue swelling is present without underlying fracture. Sinuses/Orbits:  The paranasal sinuses and mastoid air cells are clear. The globes and orbits are within normal limits. IMPRESSION: 1. Left supraorbital scalp soft tissue swelling without underlying fracture. 2. No acute intracranial abnormality. 3. Periventricular white matter hypoattenuation has advanced since prior exam. This likely  reflects the sequela of chronic microvascular ischemia. Electronically Signed   By: Marin Roberts M.D.   On: 11/04/2023 17:26   DG Chest 2 View Result Date: 11/04/2023 CLINICAL DATA:  Cough. EXAM: CHEST - 2 VIEW COMPARISON:  10/10/2023 FINDINGS: The heart size and mediastinal contours are within normal limits. Both lungs are clear. Moderate to large hiatal hernia again noted. IMPRESSION: No active cardiopulmonary disease. Moderate to large hiatal hernia. Electronically Signed   By: Danae Orleans M.D.   On: 11/04/2023 14:47   CT Renal Stone Study Result Date: 11/04/2023 CLINICAL DATA:  Right flank pain with radiation to right groin. Nephrolithiasis. Right ureteral stent. EXAM: CT ABDOMEN AND PELVIS WITHOUT CONTRAST TECHNIQUE: Multidetector CT imaging of the abdomen and pelvis was performed following the standard protocol without IV contrast. RADIATION DOSE REDUCTION: This exam was performed according to the departmental dose-optimization program which includes automated exposure control, adjustment of the mA and/or kV according to patient size and/or use of iterative reconstruction technique. COMPARISON:  10/10/2023 FINDINGS: Lower chest: No acute findings. Hepatobiliary: No mass visualized on this unenhanced exam. Gallstones are seen, however there is no evidence of cholecystitis or biliary dilatation. Pancreas: No mass or inflammatory process visualized on this unenhanced exam. Spleen:  Within normal limits in size. Adrenals/Urinary tract: Stable adrenal glands. Diffuse left renal parenchymal atrophy again noted. Multiple renal calculi are again seen bilaterally, some of which have a  staghorn appearance. 8 mm calculus again seen in the proximal to mid left ureter, with mild left hydronephrosis which has decreased since previous study. A right ureteral stent is now seen in appropriate position. A 14 mm calculus is again seen in the right renal pelvis. 7 mm calculus is again seen in the right mid ureter adjacent to the ureteral stent. Mild right hydronephrosis shows slight decrease since prior study. Unremarkable unopacified urinary bladder. Stomach/Bowel: Large hiatal hernia is again noted. No evidence of obstruction, inflammatory process, or abnormal fluid collections. Vascular/Lymphatic: No pathologically enlarged lymph nodes identified. No evidence of abdominal aortic aneurysm. Reproductive:  Stable mildly enlarged prostate. Other:  None. Musculoskeletal:  No suspicious bone lesions identified. IMPRESSION: Interval placement of right ureteral stent in appropriate position. Persistent calculi in the mid right ureter, right renal pelvis, and proximal to mid left ureter. Mild bilateral hydronephrosis shows mild decrease since prior study. Bilateral nephrolithiasis including partial staghorn calculi. Cholelithiasis. No radiographic evidence of cholecystitis. Large hiatal hernia. Stable mildly enlarged prostate. Electronically Signed   By: Danae Orleans M.D.   On: 11/04/2023 14:45    Assessment/Plan: This is a 67 y.o. male with right abdominal pain.  --HIDA scan done this morning.  Personally viewed images and agree that the cystic duct and common bile duct are patent.  No evidence of cholecystitis. --Can resume patient's diet and prn narcotics for his pain.   --No acute surgical needs at this point. Although he has cholelithiasis, does not have cholecystitis and his pain location may be more related to his kidney stones and/or stent. --Surgery team will sign off for now.  We're available if any issues or concerns.   I spent 35 minutes dedicated to the care of this patient on the date of  this encounter to include pre-visit review of records, face-to-face time with the patient discussing diagnosis and management, and any post-visit coordination of care.  Howie Ill, MD [facility]

## 2023-11-05 NOTE — Consult Note (Signed)
Date of Consultation:  11/05/2023  Requesting Physician:  Artis Delay, MD  Reason for Consultation:  Abdominal pain  History of Present Illness: Ronnie Mann is a 67 y.o. male presenting to the hospital with two weeks of ongoing right sided abdominal pain.  The patient points to pain going from the right back / flank area towards the right lower quadrant and groin.  Reports a history of kidney stones and he has right sided ureteral stent in place.  Denies nausea or vomiting.  He has been taking pain medications but ran out of them.  He also has a cough that has been going for a few weeks.  He was recently admitted last month for CHF exacerbation vs bronchitis.  He also has history of recent NSTEMI requiring PCI to proximal LAD and RCA on 05/01/23.   Workup in the ED with initial CT abdomen/pelvis showed the right ureter stent in place with bilateral ureteral stones and mild bilateral hydronephrosis.  He was also found to have cholelithiasis.  U/S of RUQ was performed and this showed cholelithiasis with gallbladder wall thickness of 3 mm.  There was noted to be a positive Murphy's sign during ultrasound.  LFTs are normal, WBC is normal.  Past Medical History: Past Medical History:  Diagnosis Date   Coronary artery disease 05/01/2023   PCI/DES   HFrEF (heart failure with reduced ejection fraction) (HCC) 05/01/2023   Hiatal hernia    HLD (hyperlipidemia)    HTN (hypertension)    Ischemic cardiomyopathy 05/01/2023   LVEF 30-35%   Kidney stones      Past Surgical History: Past Surgical History:  Procedure Laterality Date   CORONARY STENT INTERVENTION N/A 05/01/2023   Procedure: CORONARY STENT INTERVENTION;  Surgeon: Iran Ouch, MD;  Location: [facility];  Service: Cardiovascular;  Laterality: N/A;   CYSTOSCOPY W/ URETERAL STENT PLACEMENT Right 10/12/2023   Procedure: CYSTOSCOPY WITH STENT REPLACEMENT;  Surgeon: Riki Altes, MD;  Location: [facility];  Service: Urology;   Laterality: Right;   LEFT HEART CATH AND CORONARY ANGIOGRAPHY N/A 05/01/2023   Procedure: LEFT HEART CATH AND CORONARY ANGIOGRAPHY;  Surgeon: Iran Ouch, MD;  Location: [facility];  Service: Cardiovascular;  Laterality: N/A;    Home Medications: Prior to Admission medications   Medication Sig Start Date End Date Taking? Authorizing Provider  albuterol (VENTOLIN HFA) 108 (90 Base) MCG/ACT inhaler Inhale 2 puffs into the lungs every 6 (six) hours as needed for wheezing or shortness of breath. 10/14/23  Yes Alford Highland, MD  aspirin EC 81 MG tablet Take 81 mg by mouth daily. Swallow whole.   Yes [provider]  atorvastatin (LIPITOR) 40 MG tablet Take 1 tablet (40 mg total) by mouth daily. 10/14/23 11/13/23 Yes Wieting, Richard, MD  furosemide (LASIX) 20 MG tablet Take 1 tablet (20 mg total) by mouth daily. 10/14/23  Yes Wieting, Richard, MD  metoprolol succinate (TOPROL-XL) 25 MG 24 hr tablet Take 1 tablet (25 mg total) by mouth daily. 06/30/23  Yes Clarisa Kindred A, FNP  oxyCODONE-acetaminophen (PERCOCET/ROXICET) 5-325 MG tablet Take 1 tablet by mouth every 6 (six) hours as needed for severe pain (pain score 7-10). 10/14/23  Yes Wieting, Richard, MD  polyethylene glycol (MIRALAX / GLYCOLAX) 17 g packet Take 17 g by mouth daily. 10/14/23  Yes Wieting, Richard, MD  sacubitril-valsartan (ENTRESTO) 24-26 MG Take 1 tablet by mouth 2 (two) times daily. 10/23/23  Yes Delma Freeze, FNP  spironolactone (ALDACTONE) 25  MG tablet Take 1 tablet (25 mg total) by mouth daily. 08/30/23 12/18/23 Yes Sabharwal, Aditya, DO  tamsulosin (FLOMAX) 0.4 MG CAPS capsule Take 1 capsule (0.4 mg total) by mouth daily after supper. 10/14/23  Yes Wieting, Richard, MD  Vibegron (GEMTESA) 75 MG TABS Take 1 tablet (75 mg total) by mouth daily. 10/23/23  Yes Vaillancourt, Lelon Mast, PA-C    Allergies: Allergies  Allergen Reactions   Losartan Other (See Comments)    Chest pain    Social History:   reports that he has never smoked. He has never been exposed to tobacco smoke. He has never used smokeless tobacco. He reports current drug use. Drug: Marijuana. He reports that he does not drink alcohol.   Family History: Family History  Problem Relation Age of Onset   Heart disease Father     Review of Systems: Review of Systems  Constitutional:  Negative for chills and fever.  Respiratory:  Negative for shortness of breath.   Cardiovascular:  Negative for chest pain.  Gastrointestinal:  Positive for abdominal pain. Negative for nausea and vomiting.  Genitourinary:  Positive for flank pain.  Musculoskeletal:  Negative for myalgias.  Skin:  Negative for rash.    Physical Exam BP 127/74 (BP Location: Left Arm)   Pulse 63   Temp 97.9 F (36.6 C) (Oral)   Resp 14   SpO2 98%  CONSTITUTIONAL: No acute distress HEENT:  Normocephalic, atraumatic, extraocular motion intact. NECK: Trachea is midline, and there is no jugular venous distension. RESPIRATORY:  Normal respiratory effort without pathologic use of accessory muscles. CARDIOVASCULAR: Regular rhythm and rate. GI: The abdomen is soft, non-distended, with tenderness to palpation in the right flank and right lower quadrant.  There is some discomfort in RUQ but does not appear to have a positive Murphy's sign on my exam.  Has a well healed right flank incision. MUSCULOSKELETAL:  Normal muscle strength and tone in all four extremities.  No peripheral edema or cyanosis. SKIN: Skin turgor is normal. There are no pathologic skin lesions.  NEUROLOGIC:  Motor and sensation is grossly normal.  Cranial nerves are grossly intact. PSYCH:  Alert and oriented to person, place and time. Affect is normal.  Laboratory Analysis: Results for orders placed or performed during the hospital encounter of 11/04/23 (from the past 24 hours)  Comprehensive metabolic panel     Status: Abnormal   Collection Time: 11/04/23  2:02 PM  Result Value Ref Range    Sodium 136 135 - 145 mmol/L   Potassium 3.7 3.5 - 5.1 mmol/L   Chloride 110 98 - 111 mmol/L   CO2 19 (L) 22 - 32 mmol/L   Glucose, Bld 142 (H) 70 - 99 mg/dL   BUN 41 (H) 8 - 23 mg/dL   Creatinine, Ser 0.98 (H) 0.61 - 1.24 mg/dL   Calcium 7.7 (L) 8.9 - 10.3 mg/dL   Total Protein 6.0 (L) 6.5 - 8.1 g/dL   Albumin 3.3 (L) 3.5 - 5.0 g/dL   AST 24 15 - 41 U/L   ALT 19 0 - 44 U/L   Alkaline Phosphatase 71 38 - 126 U/L   Total Bilirubin 0.4 <1.2 mg/dL   GFR, Estimated 59 (L) >60 mL/min   Anion gap 7 5 - 15  Lipase, blood     Status: None   Collection Time: 11/04/23  2:02 PM  Result Value Ref Range   Lipase 30 11 - 51 U/L  Troponin I (High Sensitivity)     Status: Abnormal  Collection Time: 11/04/23  2:02 PM  Result Value Ref Range   Troponin I (High Sensitivity) 19 (H) <18 ng/L  CBC with Differential     Status: Abnormal   Collection Time: 11/04/23  2:02 PM  Result Value Ref Range   WBC 5.4 4.0 - 10.5 K/uL   RBC 3.04 (L) 4.22 - 5.81 MIL/uL   Hemoglobin 9.0 (L) 13.0 - 17.0 g/dL   HCT 16.1 (L) 09.6 - 04.5 %   MCV 92.4 80.0 - 100.0 fL   MCH 29.6 26.0 - 34.0 pg   MCHC 32.0 30.0 - 36.0 g/dL   RDW 40.9 (H) 81.1 - 91.4 %   Platelets 145 (L) 150 - 400 K/uL   nRBC 0.0 0.0 - 0.2 %   Neutrophils Relative % 69 %   Neutro Abs 3.8 1.7 - 7.7 K/uL   Lymphocytes Relative 12 %   Lymphs Abs 0.7 0.7 - 4.0 K/uL   Monocytes Relative 9 %   Monocytes Absolute 0.5 0.1 - 1.0 K/uL   Eosinophils Relative 9 %   Eosinophils Absolute 0.5 0.0 - 0.5 K/uL   Basophils Relative 1 %   Basophils Absolute 0.0 0.0 - 0.1 K/uL   Immature Granulocytes 0 %   Abs Immature Granulocytes 0.01 0.00 - 0.07 K/uL  Brain natriuretic peptide     Status: Abnormal   Collection Time: 11/04/23  2:02 PM  Result Value Ref Range   B Natriuretic Peptide 198.6 (H) 0.0 - 100.0 pg/mL  Urinalysis, Routine w reflex microscopic -Urine, Unspecified Source     Status: Abnormal   Collection Time: 11/04/23  2:08 PM  Result Value Ref Range    Color, Urine YELLOW (A) YELLOW   APPearance HAZY (A) CLEAR   Specific Gravity, Urine 1.013 1.005 - 1.030   pH 5.0 5.0 - 8.0   Glucose, UA NEGATIVE NEGATIVE mg/dL   Hgb urine dipstick LARGE (A) NEGATIVE   Bilirubin Urine NEGATIVE NEGATIVE   Ketones, ur NEGATIVE NEGATIVE mg/dL   Protein, ur 782 (A) NEGATIVE mg/dL   Nitrite NEGATIVE NEGATIVE   Leukocytes,Ua LARGE (A) NEGATIVE   RBC / HPF >50 0 - 5 RBC/hpf   WBC, UA >50 0 - 5 WBC/hpf   Bacteria, UA RARE (A) NONE SEEN   Squamous Epithelial / HPF 0-5 0 - 5 /HPF   WBC Clumps PRESENT    Mucus PRESENT   Troponin I (High Sensitivity)     Status: Abnormal   Collection Time: 11/04/23  5:23 PM  Result Value Ref Range   Troponin I (High Sensitivity) 18 (H) <18 ng/L  Protime-INR     Status: None   Collection Time: 11/04/23 11:07 PM  Result Value Ref Range   Prothrombin Time 14.7 11.4 - 15.2 seconds   INR 1.1 0.8 - 1.2  APTT     Status: None   Collection Time: 11/04/23 11:07 PM  Result Value Ref Range   aPTT 34 24 - 36 seconds  SARS Coronavirus 2 by RT PCR (hospital order, performed in [facility] hospital lab) *cepheid single result test* Anterior Nasal Swab     Status: None   Collection Time: 11/04/23 11:35 PM   Specimen: Anterior Nasal Swab  Result Value Ref Range   SARS Coronavirus 2 by RT PCR NEGATIVE NEGATIVE    Imaging: US ABDOMEN LIMITED RUQ (LIVER/GB) Result Date: 11/04/2023 CLINICAL DATA:  [address] Gallstone [address]65. EXAM: ULTRASOUND ABDOMEN LIMITED RIGHT UPPER QUADRANT COMPARISON:  CT abdomen/pelvis 11/04/2023. FINDINGS: Gallbladder: Large shadowing stone in the  fundus. Smaller mobile stone near the neck. Positive sonographic Murphy's sign reported by the sonographer. Borderline thickening of the gallbladder wall at the fundus. No pericholecystic fluid. Common bile duct: Diameter: 2.0 mm. Liver: No focal lesion identified. Within normal limits in parenchymal echogenicity. Portal vein is patent on color Doppler imaging with normal  direction of blood flow towards the liver. Other: Partially imaged right renal calculus is better evaluated on same-day abdominal CT. IMPRESSION: Cholelithiasis with positive sonographic Murphy's sign and borderline thickening of the gallbladder wall at the fundus. Findings are suspicious for acute cholecystitis. Electronically Signed   By: Orvan Falconer M.D.   On: 11/04/2023 19:07   CT Angio Chest PE W and/or Wo Contrast Result Date: 11/04/2023 CLINICAL DATA:  Pulmonary embolism (PE) suspected, high prob. Cough, congestion EXAM: CT ANGIOGRAPHY CHEST WITH CONTRAST TECHNIQUE: Multidetector CT imaging of the chest was performed using the standard protocol during bolus administration of intravenous contrast. Multiplanar CT image reconstructions and MIPs were obtained to evaluate the vascular anatomy. RADIATION DOSE REDUCTION: This exam was performed according to the departmental dose-optimization program which includes automated exposure control, adjustment of the mA and/or kV according to patient size and/or use of iterative reconstruction technique. CONTRAST:  75mL OMNIPAQUE IOHEXOL 350 MG/ML SOLN COMPARISON:  04/28/2023 FINDINGS: Cardiovascular: No filling defects in the pulmonary arteries to suggest pulmonary emboli. Heart is normal size. Aorta is normal caliber. Scattered coronary artery and aortic atherosclerosis. Mediastinum/Nodes: No mediastinal, hilar, or axillary adenopathy. Trachea and esophagus are unremarkable. Thyroid unremarkable. Large hiatal hernia. Lungs/Pleura: Compressive atelectasis in the left lower lobe adjacent to the hiatal hernia. No additional confluent opacities or effusions. Upper Abdomen: Mild right hydronephrosis with right renal pelvic and proximal ureteral stones. Right upper pole staghorn calculus. Ureteral stent partially visualized. Gallstones noted in the gallbladder. Musculoskeletal: Chest wall soft tissues are unremarkable. No acute bony abnormality. Review of the MIP images  confirms the above findings. IMPRESSION: No evidence of pulmonary embolus. Large hiatal hernia. Right hydronephrosis with right ureteral stent in place. Multiple right renal pelvic and proximal ureteral stones present. Upper pole staghorn calculus. Cholelithiasis. Aortic Atherosclerosis (ICD10-I70.0). Electronically Signed   By: Charlett Nose M.D.   On: 11/04/2023 18:26   CT Cervical Spine Wo Contrast Result Date: 11/04/2023 CLINICAL DATA:  Neck trauma.  Cough congestion 2 weeks. EXAM: CT CERVICAL SPINE WITHOUT CONTRAST TECHNIQUE: Multidetector CT imaging of the cervical spine was performed without intravenous contrast. Multiplanar CT image reconstructions were also generated. RADIATION DOSE REDUCTION: This exam was performed according to the departmental dose-optimization program which includes automated exposure control, adjustment of the mA and/or kV according to patient size and/or use of iterative reconstruction technique. COMPARISON:  CT of the cervical spine 06/01/09 FINDINGS: Alignment: No significant listhesis is present. The cervical lordosis is preserved. Skull base and vertebrae: Craniocervical junction is normal. Vertebral body heights normal. No acute fractures are present. Soft tissues and spinal canal: No prevertebral fluid or swelling. No visible canal hematoma. Disc levels: Progressive endplate changes and uncovertebral spurring C6-7 results in severe right and moderate left foraminal stenosis. Mild foraminal narrowing is present bilaterally at C5-6. Asymmetric right-sided facet hypertrophy contributes to moderate right foraminal narrowing. Upper chest: The lung apices are clear. The thoracic inlet is within normal limits. IMPRESSION: 1. No acute fracture or traumatic subluxation. 2. Progressive endplate changes and uncovertebral spurring at C6-7 results in severe right and moderate left foraminal stenosis. 3. Mild foraminal narrowing bilaterally at C5-6. 4. Asymmetric right-sided facet  hypertrophy contributes to  moderate right foraminal narrowing at C5-6. Electronically Signed   By: Marin Roberts M.D.   On: 11/04/2023 17:29   CT HEAD WO CONTRAST ( ) Result Date: 11/04/2023 CLINICAL DATA:  Head trauma, minor. EXAM: CT HEAD WITHOUT CONTRAST TECHNIQUE: Contiguous axial images were obtained from the base of the skull through the vertex without intravenous contrast. RADIATION DOSE REDUCTION: This exam was performed according to the departmental dose-optimization program which includes automated exposure control, adjustment of the mA and/or kV according to patient size and/or use of iterative reconstruction technique. COMPARISON:  CT of the head without contrast 08/01/2011 FINDINGS: Brain: No acute infarct, hemorrhage, or mass lesion is present. Periventricular white matter hypoattenuation has advanced since prior exam acute hemorrhage or mass lesion is present. The ventricles are of normal size. No significant extraaxial fluid collection is present. The brainstem and cerebellum are within normal limits. Midline structures are within normal limits. Vascular: Calcifications are present within the cavernous internal carotid arteries bilaterally. No hyperdense vessel is present. Skull: Calvarium is intact. No focal lytic or blastic lesions are present. Left supraorbital scalp soft tissue swelling is present without underlying fracture. Sinuses/Orbits: The paranasal sinuses and mastoid air cells are clear. The globes and orbits are within normal limits. IMPRESSION: 1. Left supraorbital scalp soft tissue swelling without underlying fracture. 2. No acute intracranial abnormality. 3. Periventricular white matter hypoattenuation has advanced since prior exam. This likely reflects the sequela of chronic microvascular ischemia. Electronically Signed   By: Marin Roberts M.D.   On: 11/04/2023 17:26   DG Chest 2 View Result Date: 11/04/2023 CLINICAL DATA:  Cough. EXAM: CHEST - 2 VIEW  COMPARISON:  10/10/2023 FINDINGS: The heart size and mediastinal contours are within normal limits. Both lungs are clear. Moderate to large hiatal hernia again noted. IMPRESSION: No active cardiopulmonary disease. Moderate to large hiatal hernia. Electronically Signed   By: Danae Orleans M.D.   On: 11/04/2023 14:47   CT Renal Stone Study Result Date: 11/04/2023 CLINICAL DATA:  Right flank pain with radiation to right groin. Nephrolithiasis. Right ureteral stent. EXAM: CT ABDOMEN AND PELVIS WITHOUT CONTRAST TECHNIQUE: Multidetector CT imaging of the abdomen and pelvis was performed following the standard protocol without IV contrast. RADIATION DOSE REDUCTION: This exam was performed according to the departmental dose-optimization program which includes automated exposure control, adjustment of the mA and/or kV according to patient size and/or use of iterative reconstruction technique. COMPARISON:  10/10/2023 FINDINGS: Lower chest: No acute findings. Hepatobiliary: No mass visualized on this unenhanced exam. Gallstones are seen, however there is no evidence of cholecystitis or biliary dilatation. Pancreas: No mass or inflammatory process visualized on this unenhanced exam. Spleen:  Within normal limits in size. Adrenals/Urinary tract: Stable adrenal glands. Diffuse left renal parenchymal atrophy again noted. Multiple renal calculi are again seen bilaterally, some of which have a staghorn appearance. 8 mm calculus again seen in the proximal to mid left ureter, with mild left hydronephrosis which has decreased since previous study. A right ureteral stent is now seen in appropriate position. A 14 mm calculus is again seen in the right renal pelvis. 7 mm calculus is again seen in the right mid ureter adjacent to the ureteral stent. Mild right hydronephrosis shows slight decrease since prior study. Unremarkable unopacified urinary bladder. Stomach/Bowel: Large hiatal hernia is again noted. No evidence of obstruction,  inflammatory process, or abnormal fluid collections. Vascular/Lymphatic: No pathologically enlarged lymph nodes identified. No evidence of abdominal aortic aneurysm. Reproductive:  Stable mildly enlarged prostate. Other:  None.  Musculoskeletal:  No suspicious bone lesions identified. IMPRESSION: Interval placement of right ureteral stent in appropriate position. Persistent calculi in the mid right ureter, right renal pelvis, and proximal to mid left ureter. Mild bilateral hydronephrosis shows mild decrease since prior study. Bilateral nephrolithiasis including partial staghorn calculi. Cholelithiasis. No radiographic evidence of cholecystitis. Large hiatal hernia. Stable mildly enlarged prostate. Electronically Signed   By: Danae Orleans M.D.   On: 11/04/2023 14:45    Assessment and Plan: This is a 67 y.o. male with right abdominal pain.  --Discussed with the patient the findings on his imaging and exam.  It is unclear if he's truly having cholecystitis vs pain from his kidney stones and stent.  He reports pain for two weeks at least, but imaging does not show significant inflammatory changes at the gallbladder.  His WBC is also normal and his LFTs are normal.  He has a recent NSTEMI hx and CHF exacerbation hx.  Most recent Echo last month shows EF of 25-30%.  Discussed that it would be best to obtain a HIDA scan to truly evaluate for cystic duct obstruction or cholecystitis.  Given his cardiac history, even if HIDA scan is positive, would try to treat conservatively with antibiotics alone.  There may be the possibility of interval cholecystectomy one he's more improved.  If HIDA scan is negative, then more likely the pain is coming from his kidney stones and stent. --HIDA scan order in place.  Hold narcotics tonight/overnight so HIDA scan can get done in AM.  I spent 60 minutes dedicated to the care of this patient on the date of this encounter to include pre-visit review of records, face-to-face time with  the patient discussing diagnosis and management, and any post-visit coordination of care.   Howie Ill, MD [facility]:  [phone]

## 2023-11-05 NOTE — Progress Notes (Signed)
Progress Note   Patient: Ronnie Mann HYW:[id number] DOB: [birth year] DOA: 11/04/2023     1 DOS: the patient was seen and examined on 11/05/2023   Brief hospital course: Ronnie Mann is a 67 y.o. male with medical history significant of right hydronephrosis and right ureteral stone (s/p of recent stent placement), sCHF with EF 25%, HTN, HLD, CAD with stent, CKD-3a, anemia, hiatal hernia, obesity, who presents with right flank pain.   In the ED RUQ sono showed cholelithiasis with positive sonographic Murphy's borderline thickening of gallbladder wall at fundus suspicious for acute cholecystitis.  CT renal stone study showed right ureteral stent in position, persistent calculi in right ureter, pelvis and proximal mid left ureter. EDP discussed with urologist who do not think pain is related to renal stones or stent.  Surgery team evaluated the patient in the ED, advised HIDA scan.  Assessment and Plan: Bilateral hydronephrosis: Bilateral renal stones Possible acute pyelonephritis- Possibly causing right flank pain. H/o right hydronephrosis and right ureteral stone s/p of recent stent placement. CT shows intact stent, bilateral mild hydro, bilateral renal stones. Continue IV Zosyn therapy.  Follow urine/ blood cultures. Continue pain control. Consider urology evaluation if persistent pain. He has appointment with [facility] urology jan 3rd, states he ran out of pain meds.  Cholelithiasis: Cholecystitis ruled out Status post HIDA scan.  Discussed with Dr. Aleen Campi who reviewed HIDA scan, do not think he has acute cholecystitis.  Patient's pain more related to kidney stones, stent.  History of CAD Chronic systolic CHF Patient will be continued on Lasix, beta-blocker, spironolactone and Entresto therapy. Continue aspirin, Lipitor.  Chronic kidney disease stage IIIb: Renal function stable. Continue to monitor daily renal function. Avoid nephrotoxic drugs.  Essential hypertension: Patient will be  continued on metoprolol, Entresto, spironolactone  Hyperlipidemia: Continue Lipitor therapy.  Chronic anemia: Hemoglobin stable. No active bleeding.  Generalized weakness Fall at home: PT OT evaluation. Encourage out of bed to chair. Fall, aspiration precautions.    Continue antitussive, bronchodilator medications. Nursing supportive care. DVT prophylaxis   Code Status: Full Code  Subjective: Patient is seen and examined today morning. He is uncomfortably. States flank pain going to groin. Also reports pain over RUQ, pain. He could not get opiates as he is waiting for HIDA scan.  Physical Exam: Vitals:   11/05/23 1215 11/05/23 1225 11/05/23 1229 11/05/23 1232  BP: 124/78 124/78 124/78   Pulse: 79 82 84   Resp: 20 (!) 21    Temp:    98 F (36.7 C)  TempSrc:    Oral  SpO2:  98%      General - Elderly Caucasian male, no apparent in distress due to pain HEENT - PERRLA, EOMI, atraumatic head, non tender sinuses. Lung - Clear, diffuse rales, rhonchi, no wheezes. Heart - S1, S2 heard, no murmurs, rubs, trace pedal edema. Abdomen - Soft, non tender, distended, right upper quad, flank tender, bowel sounds good Neuro - Alert, awake and oriented x 3, non focal exam. Skin - Warm and dry.  Data Reviewed:      Latest Ref Rng & Units 11/05/2023    4:41 AM 11/04/2023    2:02 PM 10/14/2023    8:25 AM  CBC  WBC 4.0 - 10.5 K/uL 4.3  5.4    Hemoglobin 13.0 - 17.0 g/dL 48.5  9.0  9.0   Hematocrit 39.0 - 52.0 % 31.8  28.1    Platelets 150 - 400 K/uL 163  145  Latest Ref Rng & Units 11/05/2023    4:41 AM 11/04/2023    2:02 PM 10/23/2023    9:38 AM  BMP  Glucose 70 - 99 mg/dL 956  213  086   BUN 8 - 23 mg/dL 47  41  26   Creatinine 0.61 - 1.24 mg/dL 5.78  4.69  6.29   BUN/Creat Ratio 10 - 24   20   Sodium 135 - 145 mmol/L 133  136  139   Potassium 3.5 - 5.1 mmol/L 5.3  3.7  4.9   Chloride 98 - 111 mmol/L 104  110  101   CO2 22 - 32 mmol/L 22  19  24    Calcium  8.9 - 10.3 mg/dL 9.0  7.7  9.2    NM Hepatobiliary Liver Func Result Date: 11/05/2023 CLINICAL DATA:  Right upper quadrant pain and right flank pain. Gallstones. EXAM: NUCLEAR MEDICINE HEPATOBILIARY IMAGING TECHNIQUE: Sequential images of the abdomen were obtained out to 60 minutes following intravenous administration of radiopharmaceutical. RADIOPHARMACEUTICALS:  5.2 mCi Tc-46m  Choletec IV COMPARISON:  None Available. FINDINGS: Prompt uptake and biliary excretion of activity by the liver is seen. Gallbladder activity is visualized, consistent with patency of cystic duct. Biliary activity passes into small bowel, consistent with patent common bile duct. IMPRESSION: Normal exam, demonstrating patency of cystic and common bile ducts. Electronically Signed   By: Danae Orleans M.D.   On: 11/05/2023 12:12   US ABDOMEN LIMITED RUQ (LIVER/GB) Result Date: 11/04/2023 CLINICAL DATA:  52841 Gallstone 32440. EXAM: ULTRASOUND ABDOMEN LIMITED RIGHT UPPER QUADRANT COMPARISON:  CT abdomen/pelvis 11/04/2023. FINDINGS: Gallbladder: Large shadowing stone in the fundus. Smaller mobile stone near the neck. Positive sonographic Murphy's sign reported by the sonographer. Borderline thickening of the gallbladder wall at the fundus. No pericholecystic fluid. Common bile duct: Diameter: 2.0 mm. Liver: No focal lesion identified. Within normal limits in parenchymal echogenicity. Portal vein is patent on color Doppler imaging with normal direction of blood flow towards the liver. Other: Partially imaged right renal calculus is better evaluated on same-day abdominal CT. IMPRESSION: Cholelithiasis with positive sonographic Murphy's sign and borderline thickening of the gallbladder wall at the fundus. Findings are suspicious for acute cholecystitis. Electronically Signed   By: Orvan Falconer M.D.   On: 11/04/2023 19:07   CT Angio Chest PE W and/or Wo Contrast Result Date: 11/04/2023 CLINICAL DATA:  Pulmonary embolism (PE) suspected,  high prob. Cough, congestion EXAM: CT ANGIOGRAPHY CHEST WITH CONTRAST TECHNIQUE: Multidetector CT imaging of the chest was performed using the standard protocol during bolus administration of intravenous contrast. Multiplanar CT image reconstructions and MIPs were obtained to evaluate the vascular anatomy. RADIATION DOSE REDUCTION: This exam was performed according to the departmental dose-optimization program which includes automated exposure control, adjustment of the mA and/or kV according to patient size and/or use of iterative reconstruction technique. CONTRAST:  75mL OMNIPAQUE IOHEXOL 350 MG/ML SOLN COMPARISON:  04/28/2023 FINDINGS: Cardiovascular: No filling defects in the pulmonary arteries to suggest pulmonary emboli. Heart is normal size. Aorta is normal caliber. Scattered coronary artery and aortic atherosclerosis. Mediastinum/Nodes: No mediastinal, hilar, or axillary adenopathy. Trachea and esophagus are unremarkable. Thyroid unremarkable. Large hiatal hernia. Lungs/Pleura: Compressive atelectasis in the left lower lobe adjacent to the hiatal hernia. No additional confluent opacities or effusions. Upper Abdomen: Mild right hydronephrosis with right renal pelvic and proximal ureteral stones. Right upper pole staghorn calculus. Ureteral stent partially visualized. Gallstones noted in the gallbladder. Musculoskeletal: Chest wall soft tissues are unremarkable. No acute  bony abnormality. Review of the MIP images confirms the above findings. IMPRESSION: No evidence of pulmonary embolus. Large hiatal hernia. Right hydronephrosis with right ureteral stent in place. Multiple right renal pelvic and proximal ureteral stones present. Upper pole staghorn calculus. Cholelithiasis. Aortic Atherosclerosis (ICD10-I70.0). Electronically Signed   By: Charlett Nose M.D.   On: 11/04/2023 18:26   CT Cervical Spine Wo Contrast Result Date: 11/04/2023 CLINICAL DATA:  Neck trauma.  Cough congestion 2 weeks. EXAM: CT CERVICAL  SPINE WITHOUT CONTRAST TECHNIQUE: Multidetector CT imaging of the cervical spine was performed without intravenous contrast. Multiplanar CT image reconstructions were also generated. RADIATION DOSE REDUCTION: This exam was performed according to the departmental dose-optimization program which includes automated exposure control, adjustment of the mA and/or kV according to patient size and/or use of iterative reconstruction technique. COMPARISON:  CT of the cervical spine 06/01/09 FINDINGS: Alignment: No significant listhesis is present. The cervical lordosis is preserved. Skull base and vertebrae: Craniocervical junction is normal. Vertebral body heights normal. No acute fractures are present. Soft tissues and spinal canal: No prevertebral fluid or swelling. No visible canal hematoma. Disc levels: Progressive endplate changes and uncovertebral spurring C6-7 results in severe right and moderate left foraminal stenosis. Mild foraminal narrowing is present bilaterally at C5-6. Asymmetric right-sided facet hypertrophy contributes to moderate right foraminal narrowing. Upper chest: The lung apices are clear. The thoracic inlet is within normal limits. IMPRESSION: 1. No acute fracture or traumatic subluxation. 2. Progressive endplate changes and uncovertebral spurring at C6-7 results in severe right and moderate left foraminal stenosis. 3. Mild foraminal narrowing bilaterally at C5-6. 4. Asymmetric right-sided facet hypertrophy contributes to moderate right foraminal narrowing at C5-6. Electronically Signed   By: Marin Roberts M.D.   On: 11/04/2023 17:29   CT HEAD WO CONTRAST ( ) Result Date: 11/04/2023 CLINICAL DATA:  Head trauma, minor. EXAM: CT HEAD WITHOUT CONTRAST TECHNIQUE: Contiguous axial images were obtained from the base of the skull through the vertex without intravenous contrast. RADIATION DOSE REDUCTION: This exam was performed according to the departmental dose-optimization program which  includes automated exposure control, adjustment of the mA and/or kV according to patient size and/or use of iterative reconstruction technique. COMPARISON:  CT of the head without contrast 08/01/2011 FINDINGS: Brain: No acute infarct, hemorrhage, or mass lesion is present. Periventricular white matter hypoattenuation has advanced since prior exam acute hemorrhage or mass lesion is present. The ventricles are of normal size. No significant extraaxial fluid collection is present. The brainstem and cerebellum are within normal limits. Midline structures are within normal limits. Vascular: Calcifications are present within the cavernous internal carotid arteries bilaterally. No hyperdense vessel is present. Skull: Calvarium is intact. No focal lytic or blastic lesions are present. Left supraorbital scalp soft tissue swelling is present without underlying fracture. Sinuses/Orbits: The paranasal sinuses and mastoid air cells are clear. The globes and orbits are within normal limits. IMPRESSION: 1. Left supraorbital scalp soft tissue swelling without underlying fracture. 2. No acute intracranial abnormality. 3. Periventricular white matter hypoattenuation has advanced since prior exam. This likely reflects the sequela of chronic microvascular ischemia. Electronically Signed   By: Marin Roberts M.D.   On: 11/04/2023 17:26   DG Chest 2 View Result Date: 11/04/2023 CLINICAL DATA:  Cough. EXAM: CHEST - 2 VIEW COMPARISON:  10/10/2023 FINDINGS: The heart size and mediastinal contours are within normal limits. Both lungs are clear. Moderate to large hiatal hernia again noted. IMPRESSION: No active cardiopulmonary disease. Moderate to large hiatal hernia. Electronically Signed  By: Danae Orleans M.D.   On: 11/04/2023 14:47   CT Renal Stone Study Result Date: 11/04/2023 CLINICAL DATA:  Right flank pain with radiation to right groin. Nephrolithiasis. Right ureteral stent. EXAM: CT ABDOMEN AND PELVIS WITHOUT CONTRAST  TECHNIQUE: Multidetector CT imaging of the abdomen and pelvis was performed following the standard protocol without IV contrast. RADIATION DOSE REDUCTION: This exam was performed according to the departmental dose-optimization program which includes automated exposure control, adjustment of the mA and/or kV according to patient size and/or use of iterative reconstruction technique. COMPARISON:  10/10/2023 FINDINGS: Lower chest: No acute findings. Hepatobiliary: No mass visualized on this unenhanced exam. Gallstones are seen, however there is no evidence of cholecystitis or biliary dilatation. Pancreas: No mass or inflammatory process visualized on this unenhanced exam. Spleen:  Within normal limits in size. Adrenals/Urinary tract: Stable adrenal glands. Diffuse left renal parenchymal atrophy again noted. Multiple renal calculi are again seen bilaterally, some of which have a staghorn appearance. 8 mm calculus again seen in the proximal to mid left ureter, with mild left hydronephrosis which has decreased since previous study. A right ureteral stent is now seen in appropriate position. A 14 mm calculus is again seen in the right renal pelvis. 7 mm calculus is again seen in the right mid ureter adjacent to the ureteral stent. Mild right hydronephrosis shows slight decrease since prior study. Unremarkable unopacified urinary bladder. Stomach/Bowel: Large hiatal hernia is again noted. No evidence of obstruction, inflammatory process, or abnormal fluid collections. Vascular/Lymphatic: No pathologically enlarged lymph nodes identified. No evidence of abdominal aortic aneurysm. Reproductive:  Stable mildly enlarged prostate. Other:  None. Musculoskeletal:  No suspicious bone lesions identified. IMPRESSION: Interval placement of right ureteral stent in appropriate position. Persistent calculi in the mid right ureter, right renal pelvis, and proximal to mid left ureter. Mild bilateral hydronephrosis shows mild decrease since  prior study. Bilateral nephrolithiasis including partial staghorn calculi. Cholelithiasis. No radiographic evidence of cholecystitis. Large hiatal hernia. Stable mildly enlarged prostate. Electronically Signed   By: Danae Orleans M.D.   On: 11/04/2023 14:45     Family Communication: Discussed with patient, he understand and agree. All questions answereed.   Disposition: Status is: Inpatient Remains inpatient appropriate because: right flank pain,   Planned Discharge Destination: Home with Home Health     Time spent: 40 minutes  Author: Marcelino Duster, MD 11/05/2023 12:44 PM Secure chat 7am to 7pm For on call review [email or web address].

## 2023-11-05 NOTE — ED Notes (Signed)
10am meds still overdue because pt has been in scans since 0931.

## 2023-11-05 NOTE — Progress Notes (Signed)
OT Cancellation Note  Patient Details Name: Ronnie Mann MRN: [id number] DOB: March 20, 1956   Cancelled Treatment:    Reason Eval/Treat Not Completed: Patient at procedure or test/ unavailable. OT order received, chart reviewed. Pt currently OTF for HIDA scan. Will hold at this time and re-attempt at a later date/time as available.   Rockney Ghee, M.S., OTR/L 11/05/23, 10:33 AM

## 2023-11-05 NOTE — ED Notes (Signed)
Pt calling out for pain meds. Not supposed to have narcotics before HIDA scan. No other orders in place for pain meds. Hospitalist contacted.

## 2023-11-05 NOTE — ED Notes (Signed)
Pt still in nuclear medicine getting scans.

## 2023-11-05 NOTE — Plan of Care (Signed)

## 2023-11-05 NOTE — ED Notes (Signed)
Pt back from scans and ok to get narcotics now. Pt crying in bed and calling on bell for morphine.

## 2023-11-06 ENCOUNTER — Telehealth: Payer: Self-pay | Admitting: Pharmacist

## 2023-11-06 DIAGNOSIS — R109 Unspecified abdominal pain: Secondary | ICD-10-CM | POA: Diagnosis not present

## 2023-11-06 LAB — CBC
HCT: 29.4 % — ABNORMAL LOW (ref 39.0–52.0)
Hemoglobin: 9.5 g/dL — ABNORMAL LOW (ref 13.0–17.0)
MCH: 29.4 pg (ref 26.0–34.0)
MCHC: 32.3 g/dL (ref 30.0–36.0)
MCV: 91 fL (ref 80.0–100.0)
Platelets: 153 10*3/uL (ref 150–400)
RBC: 3.23 MIL/uL — ABNORMAL LOW (ref 4.22–5.81)
RDW: 16.8 % — ABNORMAL HIGH (ref 11.5–15.5)
WBC: 5.1 10*3/uL (ref 4.0–10.5)
nRBC: 0 % (ref 0.0–0.2)

## 2023-11-06 LAB — BASIC METABOLIC PANEL
Anion gap: 9 (ref 5–15)
BUN: 42 mg/dL — ABNORMAL HIGH (ref 8–23)
CO2: 22 mmol/L (ref 22–32)
Calcium: 8.8 mg/dL — ABNORMAL LOW (ref 8.9–10.3)
Chloride: 106 mmol/L (ref 98–111)
Creatinine, Ser: 1.48 mg/dL — ABNORMAL HIGH (ref 0.61–1.24)
GFR, Estimated: 52 mL/min — ABNORMAL LOW (ref >60)
Glucose, Bld: 129 mg/dL — ABNORMAL HIGH (ref 70–99)
Potassium: 4.8 mmol/L (ref 3.5–5.1)
Sodium: 137 mmol/L (ref 135–145)

## 2023-11-06 LAB — URINE CULTURE: Culture: <10000 — AB

## 2023-11-06 MED ORDER — OXYBUTYNIN CHLORIDE 5 MG PO TABS: 5.0000 mg | ORAL_TABLET | Freq: Three times a day (TID) | ORAL | 0 refills | Status: AC

## 2023-11-06 MED ORDER — OXYBUTYNIN CHLORIDE 5 MG PO TABS
5.0000 mg | ORAL_TABLET | Freq: Three times a day (TID) | ORAL | Status: DC
  Administered 2023-11-06: 5 mg via ORAL
  Filled 2023-11-06: qty 1

## 2023-11-06 MED ORDER — MIRABEGRON ER 25 MG PO TB24
25.0000 mg | ORAL_TABLET | Freq: Once | ORAL | Status: DC
  Filled 2023-11-06: qty 1

## 2023-11-06 MED ORDER — OXYCODONE-ACETAMINOPHEN 5-325 MG PO TABS: 1.0000 | ORAL_TABLET | Freq: Three times a day (TID) | ORAL | 0 refills | Status: DC | PRN

## 2023-11-06 MED ORDER — TAMSULOSIN HCL 0.4 MG PO CAPS
0.4000 mg | ORAL_CAPSULE | Freq: Every day | ORAL | 2 refills | Status: DC
  Filled 2024-04-25 – 2024-05-15 (×2): qty 30, 30d supply, fill #0

## 2023-11-06 MED ORDER — MIRABEGRON ER 50 MG PO TB24: 50.0000 mg | ORAL_TABLET | Freq: Every day | ORAL | Status: DC

## 2023-11-06 NOTE — Discharge Summary (Addendum)
 Physician Discharge Summary   SHMUEL GIRGIS  male DOB: October 04, 1956  WJX:[id number]  PCP: Pcp, No  Admit date: 11/04/2023 Discharge date: 11/06/2023  Admitted From: home Disposition:  home Home Health: Yes CODE STATUS: Full code  Discharge Instructions     Discharge instructions   Complete by: As directed    For your stent pain, you can take oxybutynin 5 mg 3 times daily, and Percocet PRN.  Follow up with your urologist in [facility].  Please hold your Entresto, lasix and spironolactone because your blood pressure is sometimes low. [facility] Course:  For full details, please see H&P, progress notes, consult notes and ancillary notes.  Briefly,  THERAN VANDERGRIFT is a 67 y.o. male with medical history significant of right hydronephrosis and right ureteral stone (s/p recent stent placement), sCHF with EF 25%, HTN, CAD with stent, CKD-3a, obesity, who presented with right flank pain.    In the ED RUQ sono showed cholelithiasis with positive sonographic Murphy's borderline thickening of gallbladder wall at fundus suspicious for acute cholecystitis.  CT renal stone study showed right ureteral stent in position, persistent calculi in right ureter, pelvis and proximal mid left ureter.  Surgery team evaluated the patient in the ED, advised HIDA scan which ruled out acute cholecystitis.   Bilateral hydronephrosis: Bilateral renal stones Ruled out acute pyelonephritis- H/o right hydronephrosis and right ureteral stone s/p of recent stent placement on 10/12/23.  Urology did not place a stent on the left side secondary to chronic left-sided chronic hydronephrosis.  --pt reported right flank pain since stent placement from 10/12/23.  He has appointment with [facility] urology Jan 3rd for management of stent and stones, but ran out of pain meds.  Pt was discharged on oxybutynin 5 mg TID and Percocet x 30 tabs to last him until his [facility] urology f/u.   Cholelithiasis: Cholecystitis ruled out Dr.  Aleen Campi who reviewed HIDA scan, did not think pt has acute cholecystitis.  Patient's pain more related to kidney stones, stent.   History of CAD Chronic systolic CHF --cont Toprol --Hold Entresto, lasix and spironolactone due to intermittent hypotension. Continue aspirin, Lipitor.   Chronic kidney disease stage IIIb: Renal function stable.   Essential hypertension, not currently active --cont Toprol --Hold Entresto, lasix and spironolactone due to intermittent hypotension.   Hyperlipidemia: Continue Lipitor therapy.   Chronic anemia: Hemoglobin stable in 9's. No active bleeding.   Generalized weakness Fall at home: PT OT    Unless noted above, medications under "STOP" list are ones pt was not taking PTA.  Discharge Diagnoses:  Principal Problem:   Right flank pain Active Problems:   Myocardial injury   CAD (coronary artery disease)   Essential hypertension   Chronic systolic CHF (congestive heart failure) (HCC)   Chronic kidney disease, stage 3b (HCC)   HLD (hyperlipidemia)   Normocytic anemia   Fall at home, initial encounter   Ureteral stone with hydronephrosis   Cough   30 Day Unplanned Readmission Risk Score    Flowsheet Row ED to Hosp-Admission (Current) from 11/04/2023 in [facility]  30 Day Unplanned Readmission Risk Score (%) 26.23 Filed at 11/06/2023 1200       This score is the patient's risk of an unplanned readmission within 30 days of being discharged (0 -100%). The score is based on dignosis, age, lab data, medications, orders, and past utilization.   Low:  0-14.9   Medium: 15-21.9  High: 22-29.9   Extreme: 30 and above         Discharge Instructions:  Allergies as of 11/06/2023       Reactions   Losartan Other (See Comments)   Chest pain        Medication List     PAUSE taking these medications    Entresto 24-26 MG Wait to take this until your doctor or other care provider tells you to  start again. Generic drug: sacubitril-valsartan Take 1 tablet by mouth 2 (two) times daily.   furosemide 20 MG tablet Wait to take this until your doctor or other care provider tells you to start again. Commonly known as: Lasix Take 1 tablet (20 mg total) by mouth daily.   spironolactone 25 MG tablet Wait to take this until your doctor or other care provider tells you to start again. Commonly known as: ALDACTONE Take 1 tablet (25 mg total) by mouth daily.       STOP taking these medications    Gemtesa 75 MG Tabs Generic drug: Vibegron       TAKE these medications    albuterol 108 (90 Base) MCG/ACT inhaler Commonly known as: VENTOLIN HFA Inhale 2 puffs into the lungs every 6 (six) hours as needed for wheezing or shortness of breath.   aspirin EC 81 MG tablet Take 81 mg by mouth daily. Swallow whole.   atorvastatin 40 MG tablet Commonly known as: LIPITOR Take 1 tablet (40 mg total) by mouth daily.   metoprolol succinate 25 MG 24 hr tablet Commonly known as: TOPROL-XL Take 1 tablet (25 mg total) by mouth daily.   oxybutynin 5 MG tablet Commonly known as: DITROPAN Take 1 tablet (5 mg total) by mouth 3 (three) times daily.   oxyCODONE-acetaminophen 5-325 MG tablet Commonly known as: PERCOCET/ROXICET Take 1 tablet by mouth every 8 (eight) hours as needed for severe pain (pain score 7-10). What changed: when to take this   polyethylene glycol 17 g packet Commonly known as: MIRALAX / GLYCOLAX Take 17 g by mouth daily.   tamsulosin 0.4 MG Caps capsule Commonly known as: FLOMAX Take 1 capsule (0.4 mg total) by mouth daily after supper.         Follow-up Information     Care, [facility] Follow up.   Specialty: Home Health Services Why: They will follow up with you for your home health PT needs. Contact information: [address] [place] [address] [phone]         [facility] Follow  up in 7 day(s).   Specialty: Cardiology Why: Hospital Follow-Up 11/13/23 @ 1:30 PM Please bring all medications to follow-up appointment [facility] [employer] @ the door Contact information: [address] [place] [address] [phone]        Your urologist at [facility] Follow up.                  Allergies  Allergen Reactions   Losartan Other (See Comments)    Chest pain     The results of significant diagnostics from this hospitalization (including imaging, microbiology, ancillary and laboratory) are listed below for reference.   Consultations:   Procedures/Studies: NM Hepatobiliary Liver Func Result Date: 11/05/2023 CLINICAL DATA:  Right upper quadrant pain and right flank pain. Gallstones. EXAM: NUCLEAR MEDICINE HEPATOBILIARY IMAGING TECHNIQUE: Sequential images of the abdomen were obtained out to 60 minutes following  intravenous administration of radiopharmaceutical. RADIOPHARMACEUTICALS:  5.2 mCi Tc-28m  Choletec IV COMPARISON:  None Available. FINDINGS: Prompt uptake and biliary excretion of activity by the liver is seen. Gallbladder activity is visualized, consistent with patency of cystic duct. Biliary activity passes into small bowel, consistent with patent common bile duct. IMPRESSION: Normal exam, demonstrating patency of cystic and common bile ducts. Electronically Signed   By: Danae Orleans M.D.   On: 11/05/2023 12:12   US ABDOMEN LIMITED RUQ (LIVER/GB) Result Date: 11/04/2023 CLINICAL DATA:  [address] [place] [address]. EXAM: ULTRASOUND ABDOMEN LIMITED RIGHT UPPER QUADRANT COMPARISON:  CT abdomen/pelvis 11/04/2023. FINDINGS: Gallbladder: Large shadowing stone in the fundus. Smaller mobile stone near the neck. Positive sonographic Murphy's sign reported by the sonographer. Borderline thickening of the gallbladder wall at the fundus. No pericholecystic fluid. Common bile duct: Diameter: 2.0 mm. Liver:  No focal lesion identified. Within normal limits in parenchymal echogenicity. Portal vein is patent on color Doppler imaging with normal direction of blood flow towards the liver. Other: Partially imaged right renal calculus is better evaluated on same-day abdominal CT. IMPRESSION: Cholelithiasis with positive sonographic Murphy's sign and borderline thickening of the gallbladder wall at the fundus. Findings are suspicious for acute cholecystitis. Electronically Signed   By: Orvan Falconer M.D.   On: 11/04/2023 19:07   CT Angio Chest PE W and/or Wo Contrast Result Date: 11/04/2023 CLINICAL DATA:  Pulmonary embolism (PE) suspected, high prob. Cough, congestion EXAM: CT ANGIOGRAPHY CHEST WITH CONTRAST TECHNIQUE: Multidetector CT imaging of the chest was performed using the standard protocol during bolus administration of intravenous contrast. Multiplanar CT image reconstructions and MIPs were obtained to evaluate the vascular anatomy. RADIATION DOSE REDUCTION: This exam was performed according to the departmental dose-optimization program which includes automated exposure control, adjustment of the mA and/or kV according to patient size and/or use of iterative reconstruction technique. CONTRAST:  75mL OMNIPAQUE IOHEXOL 350 MG/ML SOLN COMPARISON:  04/28/2023 FINDINGS: Cardiovascular: No filling defects in the pulmonary arteries to suggest pulmonary emboli. Heart is normal size. Aorta is normal caliber. Scattered coronary artery and aortic atherosclerosis. Mediastinum/Nodes: No mediastinal, hilar, or axillary adenopathy. Trachea and esophagus are unremarkable. Thyroid unremarkable. Large hiatal hernia. Lungs/Pleura: Compressive atelectasis in the left lower lobe adjacent to the hiatal hernia. No additional confluent opacities or effusions. Upper Abdomen: Mild right hydronephrosis with right renal pelvic and proximal ureteral stones. Right upper pole staghorn calculus. Ureteral stent partially visualized. Gallstones  noted in the gallbladder. Musculoskeletal: Chest wall soft tissues are unremarkable. No acute bony abnormality. Review of the MIP images confirms the above findings. IMPRESSION: No evidence of pulmonary embolus. Large hiatal hernia. Right hydronephrosis with right ureteral stent in place. Multiple right renal pelvic and proximal ureteral stones present. Upper pole staghorn calculus. Cholelithiasis. Aortic Atherosclerosis (ICD10-I70.0). Electronically Signed   By: Charlett Nose M.D.   On: 11/04/2023 18:26   CT Cervical Spine Wo Contrast Result Date: 11/04/2023 CLINICAL DATA:  Neck trauma.  Cough congestion 2 weeks. EXAM: CT CERVICAL SPINE WITHOUT CONTRAST TECHNIQUE: Multidetector CT imaging of the cervical spine was performed without intravenous contrast. Multiplanar CT image reconstructions were also generated. RADIATION DOSE REDUCTION: This exam was performed according to the departmental dose-optimization program which includes automated exposure control, adjustment of the mA and/or kV according to patient size and/or use of iterative reconstruction technique. COMPARISON:  CT of the cervical spine 06/01/09 FINDINGS: Alignment: No significant listhesis is present. The cervical lordosis is preserved. Skull base and vertebrae: Craniocervical junction is normal. Vertebral body heights  normal. No acute fractures are present. Soft tissues and spinal canal: No prevertebral fluid or swelling. No visible canal hematoma. Disc levels: Progressive endplate changes and uncovertebral spurring C6-7 results in severe right and moderate left foraminal stenosis. Mild foraminal narrowing is present bilaterally at C5-6. Asymmetric right-sided facet hypertrophy contributes to moderate right foraminal narrowing. Upper chest: The lung apices are clear. The thoracic inlet is within normal limits. IMPRESSION: 1. No acute fracture or traumatic subluxation. 2. Progressive endplate changes and uncovertebral spurring at C6-7 results in  severe right and moderate left foraminal stenosis. 3. Mild foraminal narrowing bilaterally at C5-6. 4. Asymmetric right-sided facet hypertrophy contributes to moderate right foraminal narrowing at C5-6. Electronically Signed   By: Marin Roberts M.D.   On: 11/04/2023 17:29   CT HEAD WO CONTRAST ( ) Result Date: 11/04/2023 CLINICAL DATA:  Head trauma, minor. EXAM: CT HEAD WITHOUT CONTRAST TECHNIQUE: Contiguous axial images were obtained from the base of the skull through the vertex without intravenous contrast. RADIATION DOSE REDUCTION: This exam was performed according to the departmental dose-optimization program which includes automated exposure control, adjustment of the mA and/or kV according to patient size and/or use of iterative reconstruction technique. COMPARISON:  CT of the head without contrast 08/01/2011 FINDINGS: Brain: No acute infarct, hemorrhage, or mass lesion is present. Periventricular white matter hypoattenuation has advanced since prior exam acute hemorrhage or mass lesion is present. The ventricles are of normal size. No significant extraaxial fluid collection is present. The brainstem and cerebellum are within normal limits. Midline structures are within normal limits. Vascular: Calcifications are present within the cavernous internal carotid arteries bilaterally. No hyperdense vessel is present. Skull: Calvarium is intact. No focal lytic or blastic lesions are present. Left supraorbital scalp soft tissue swelling is present without underlying fracture. Sinuses/Orbits: The paranasal sinuses and mastoid air cells are clear. The globes and orbits are within normal limits. IMPRESSION: 1. Left supraorbital scalp soft tissue swelling without underlying fracture. 2. No acute intracranial abnormality. 3. Periventricular white matter hypoattenuation has advanced since prior exam. This likely reflects the sequela of chronic microvascular ischemia. Electronically Signed   By: Marin Roberts M.D.   On: 11/04/2023 17:26   DG Chest 2 View Result Date: 11/04/2023 CLINICAL DATA:  Cough. EXAM: CHEST - 2 VIEW COMPARISON:  10/10/2023 FINDINGS: The heart size and mediastinal contours are within normal limits. Both lungs are clear. Moderate to large hiatal hernia again noted. IMPRESSION: No active cardiopulmonary disease. Moderate to large hiatal hernia. Electronically Signed   By: Danae Orleans M.D.   On: 11/04/2023 14:47   CT Renal Stone Study Result Date: 11/04/2023 CLINICAL DATA:  Right flank pain with radiation to right groin. Nephrolithiasis. Right ureteral stent. EXAM: CT ABDOMEN AND PELVIS WITHOUT CONTRAST TECHNIQUE: Multidetector CT imaging of the abdomen and pelvis was performed following the standard protocol without IV contrast. RADIATION DOSE REDUCTION: This exam was performed according to the departmental dose-optimization program which includes automated exposure control, adjustment of the mA and/or kV according to patient size and/or use of iterative reconstruction technique. COMPARISON:  10/10/2023 FINDINGS: Lower chest: No acute findings. Hepatobiliary: No mass visualized on this unenhanced exam. Gallstones are seen, however there is no evidence of cholecystitis or biliary dilatation. Pancreas: No mass or inflammatory process visualized on this unenhanced exam. Spleen:  Within normal limits in size. Adrenals/Urinary tract: Stable adrenal glands. Diffuse left renal parenchymal atrophy again noted. Multiple renal calculi are again seen bilaterally, some of which have a staghorn appearance. 8 mm  calculus again seen in the proximal to mid left ureter, with mild left hydronephrosis which has decreased since previous study. A right ureteral stent is now seen in appropriate position. A 14 mm calculus is again seen in the right renal pelvis. 7 mm calculus is again seen in the right mid ureter adjacent to the ureteral stent. Mild right hydronephrosis shows slight decrease since prior  study. Unremarkable unopacified urinary bladder. Stomach/Bowel: Large hiatal hernia is again noted. No evidence of obstruction, inflammatory process, or abnormal fluid collections. Vascular/Lymphatic: No pathologically enlarged lymph nodes identified. No evidence of abdominal aortic aneurysm. Reproductive:  Stable mildly enlarged prostate. Other:  None. Musculoskeletal:  No suspicious bone lesions identified. IMPRESSION: Interval placement of right ureteral stent in appropriate position. Persistent calculi in the mid right ureter, right renal pelvis, and proximal to mid left ureter. Mild bilateral hydronephrosis shows mild decrease since prior study. Bilateral nephrolithiasis including partial staghorn calculi. Cholelithiasis. No radiographic evidence of cholecystitis. Large hiatal hernia. Stable mildly enlarged prostate. Electronically Signed   By: Danae Orleans M.D.   On: 11/04/2023 14:45   DG Abd 1 View Result Date: 10/23/2023 CLINICAL DATA:  Right abdominal pain. Status post right ureter stent placement. EXAM: ABDOMEN - 1 VIEW COMPARISON:  10/12/2023 and CT 10/10/2023 FINDINGS: Staghorn calculus involving the right kidney upper pole is again noted. Right ureter stent is present. Proximal aspect of the ureter stent is positioned between the right kidney upper pole staghorn calculus and the large stone in the renal pelvic region that measures up to 2.8 cm. Again noted is a calcification in the mid right ureter adjacent to the right ureter stent that measures roughly 1.5 cm. Distal aspect of the ureter stent is in the region of the urinary bladder. Again noted are calcifications and staghorn calculus in left kidney lower pole and a large stone in the proximal left ureter that measures roughly 1.9 cm. Bowel gas throughout the abdomen with a large amount of stool in the right abdomen. No significant bowel gas in the pelvis. Limited evaluation of the lung bases but there is a hiatal hernia. IMPRESSION: 1. Bilateral  urinary calculi have not significantly changed in position. Again noted are bilateral staghorn calculi. 2. Right ureter stent is present. The proximal pigtail is positioned between the right kidney upper pole staghorn calculus and the right renal pelvic calculus. If there is concern for persistent hydronephrosis, consider further evaluation with CT or ultrasound. 3. Large amount of stool in the right abdomen. 4. Hiatal hernia. Electronically Signed   By: Richarda Overlie M.D.   On: 10/23/2023 15:44   ECHOCARDIOGRAM LIMITED Result Date: 10/13/2023    ECHOCARDIOGRAM LIMITED REPORT   Patient Name:   ARAN MENNING Date of Exam: 10/13/2023 Medical Rec #:  [id number]    Height:       69.0 in Accession #:    [id number]   Weight:       191.6 lb Date of Birth:  [birth year]    BSA:          2.029 m Patient Age:    67 years     BP:           107/79 mmHg Patient Gender: M            HR:           80 bpm. Exam Location:  [facility] Procedure: Limited Echo, Limited Color Doppler and 3D Echo Indications:     CHF  History:  Patient has prior history of Echocardiogram examinations, most                  recent 04/30/2023. CHF and Cardiomyopathy, CAD and Previous                  Myocardial Infarction, Signs/Symptoms:Shortness of Breath; Risk                  Factors:Hypertension, Diabetes and Dyslipidemia. CKD.  Sonographer:     Mikki Harbor Referring Phys:  [id number] SHERI HAMMOCK Diagnosing Phys: Debbe Odea MD IMPRESSIONS  1. Left ventricular ejection fraction, by estimation, is 25 to 30%. The left ventricle has severely decreased function. The left ventricular internal cavity size was mildly dilated.  2. Right ventricular systolic function is low normal. The right ventricular size is normal. There is normal pulmonary artery systolic pressure.  3. Left atrial size was moderately dilated.  4. The mitral valve is normal in structure. Moderate mitral valve regurgitation.  5. The aortic valve is tricuspid. Aortic valve  regurgitation is not visualized.  6. Aortic dilatation noted. There is mild dilatation of the aortic root, measuring 40 mm.  7. The inferior vena cava is normal in size with greater than 50% respiratory variability, suggesting right atrial pressure of 3 mmHg. FINDINGS  Left Ventricle: Left ventricular ejection fraction, by estimation, is 25 to 30%. The left ventricle has severely decreased function. The left ventricular internal cavity size was mildly dilated. There is no left ventricular hypertrophy. Right Ventricle: The right ventricular size is normal. No increase in right ventricular wall thickness. Right ventricular systolic function is low normal. There is normal pulmonary artery systolic pressure. The tricuspid regurgitant velocity is 2.23 m/s,  and with an assumed right atrial pressure of 3 mmHg, the estimated right ventricular systolic pressure is 22.9 mmHg. Left Atrium: Left atrial size was moderately dilated. Right Atrium: Right atrial size was normal in size. Pericardium: There is no evidence of pericardial effusion. Mitral Valve: The mitral valve is normal in structure. Moderate mitral valve regurgitation. Tricuspid Valve: The tricuspid valve is normal in structure. Tricuspid valve regurgitation is not demonstrated. Aortic Valve: The aortic valve is tricuspid. Aortic valve regurgitation is not visualized. Aorta: Aortic dilatation noted. There is mild dilatation of the aortic root, measuring 40 mm. Venous: The inferior vena cava is normal in size with greater than 50% respiratory variability, suggesting right atrial pressure of 3 mmHg. Additional Comments: Color Doppler performed.  LEFT VENTRICLE PLAX 2D LVIDd:         6.00 cm LVIDs:         5.10 cm LV PW:         1.00 cm LV IVS:        1.00 cm LVOT diam:     2.10 cm LVOT Area:     3.46 cm  RIGHT VENTRICLE RV Basal diam:  3.35 cm RV Mid diam:    3.40 cm LEFT ATRIUM              Index        RIGHT ATRIUM           Index LA diam:        4.30 cm  2.12  cm/m   RA Area:     20.00 cm LA Vol (A2C):   119.0 ml 58.66 ml/m  RA Volume:   58.40 ml  28.79 ml/m LA Vol (A4C):   83.6 ml  41.21 ml/m LA Biplane Vol: 103.0 ml  50.77 ml/m   AORTA Ao Root diam: 4.00 cm Ao Asc diam:  3.70 cm MR Peak grad:    86.9 mmHg    TRICUSPID VALVE MR Mean grad:    56.0 mmHg    TR Peak grad:   19.9 mmHg MR Vmax:         466.00 cm/s  TR Vmax:        223.00 cm/s MR Vmean:        347.0 cm/s MR PISA:         2.26 cm     SHUNTS MR PISA Eff ROA: 45 mm       Systemic Diam: 2.10 cm MR PISA Radius:  0.60 cm Debbe Odea MD Electronically signed by Debbe Odea MD Signature Date/Time: 10/13/2023/11:02:35 AM    Final    DG OR UROLOGY CYSTO IMAGE ([facility] ONLY) Result Date: 10/12/2023 There is no interpretation for this exam.  This order is for images obtained during a surgical procedure.  Please See "Surgeries" Tab for more information regarding the procedure.   CT RENAL STONE STUDY Result Date: 10/10/2023 CLINICAL DATA:  Bilateral flank pain right greater than left EXAM: CT ABDOMEN AND PELVIS WITHOUT CONTRAST TECHNIQUE: Multidetector CT imaging of the abdomen and pelvis was performed following the standard protocol without IV contrast. RADIATION DOSE REDUCTION: This exam was performed according to the departmental dose-optimization program which includes automated exposure control, adjustment of the mA and/or kV according to patient size and/or use of iterative reconstruction technique. COMPARISON:  05/21/2023 FINDINGS: Lower chest: Trace bilateral pleural effusions. Dependent areas of consolidation within the lungs consistent with atelectasis. Large hiatal hernia. Hepatobiliary: Unremarkable unenhanced appearance of the liver. Multiple large calcified gallstones are identified. No evidence of acute cholecystitis. Pancreas: Unremarkable. No pancreatic ductal dilatation or surrounding inflammatory changes. Spleen: Normal in size without focal abnormality. Adrenals/Urinary Tract:  There is significant right-sided hydronephrosis and hydroureter, due to an obstructing 14 mm mid right ureteral calculus reference image 62/2. There is a 27 mm staghorn type calculus at the right UPJ. Nonobstructing 44 mm staghorn calculus seen within the upper pole right kidney. There is stranding of the renal sinus fat surrounding the right renal pelvis, and superimposed infection cannot be excluded. Chronic obstructing proximal left ureteral calculus is identified, measuring 13 mm in maximal dimension. Mild chronic left hydronephrosis and hydroureter with severe left renal cortical thinning. Other nonobstructing left renal calculi are seen, with largest staghorn type calculus in the lower pole measuring 23 mm. Benign renal cortical cysts are again identified, do not require specific imaging follow-up. Stable bilateral adrenal adenomas. Bladder is unremarkable. Stomach/Bowel: No bowel obstruction or ileus. Normal appendix right lower quadrant. No bowel wall thickening or inflammatory change. Vascular/Lymphatic: Aortic atherosclerosis. No enlarged abdominal or pelvic lymph nodes. Reproductive: Prostate is unremarkable. Other: No free fluid or free intraperitoneal gas. No abdominal wall hernia. Musculoskeletal: No acute or destructive bony abnormalities. Reconstructed images demonstrate no additional findings. IMPRESSION: 1. Moderate right-sided hydronephrosis due to an obstructing 14 mm mid right ureteral calculus. The 27 mm staghorn type calculus at the right UPJ may also contribute to the obstructive uropathy. Stranding of the renal sinus fat could reflect superimposed infection. 2. Stable chronic obstruction stable chronic 13 mm obstructing mid left ureteral calculus, with persistent severe hydronephrosis and marked renal cortical thinning. 3. Other bilateral nonobstructing staghorn type renal calculi as above. 4. Cholelithiasis without cholecystitis. 5. Trace bilateral pleural effusions, with bilateral lower  lobe atelectasis. 6. Large hiatal hernia. 7.  Aortic  Atherosclerosis (ICD10-I70.0). Electronically Signed   By: Sharlet Salina M.D.   On: 10/10/2023 21:17   DG Chest Portable 1 View Result Date: 10/10/2023 CLINICAL DATA:  Short of breath, chest pain for 2 days radiating down left arm EXAM: PORTABLE CHEST 1 VIEW COMPARISON:  05/16/2023 FINDINGS: 2 frontal views of the chest demonstrates stable enlargement of the cardiac silhouette. There is vascular congestion, with diffuse bilateral interstitial prominence consistent with developing edema. No effusion or pneumothorax. Stable hiatal hernia. IMPRESSION: 1. Findings consistent with congestive heart failure and developing interstitial edema. 2. Hiatal hernia. Electronically Signed   By: Sharlet Salina M.D.   On: 10/10/2023 21:09      Labs: BNP (last 3 results) Recent Labs    08/30/23 1455 10/10/23 1645 11/04/23 1402  BNP 410.5* 975.7* 198.6*   Basic Metabolic Panel: Recent Labs  Lab 11/04/23 1402 11/05/23 0441 11/06/23 0635  NA 136 133* 137  K 3.7 5.3* 4.8  CL 110 104 106  CO2 19* 22 22  GLUCOSE 142* 277* 129*  BUN 41* 47* 42*  CREATININE 1.32* 1.55* 1.48*  CALCIUM 7.7* 9.0 8.8*   Liver Function Tests: Recent Labs  Lab 11/04/23 1402  AST 24  ALT 19  ALKPHOS 71  BILITOT 0.4  PROT 6.0*  ALBUMIN 3.3*   Recent Labs  Lab 11/04/23 1402  LIPASE 30   No results for input(s): "AMMONIA" in the last 168 hours. CBC: Recent Labs  Lab 11/04/23 1402 11/05/23 0441 11/06/23 0635  WBC 5.4 4.3 5.1  NEUTROABS 3.8  --   --   HGB 9.0* 10.2* 9.5*  HCT 28.1* 31.8* 29.4*  MCV 92.4 91.9 91.0  PLT 145* 163 153   Cardiac Enzymes: No results for input(s): "CKTOTAL", "CKMB", "CKMBINDEX", "TROPONINI" in the last 168 hours. BNP: Invalid input(s): "POCBNP" CBG: No results for input(s): "GLUCAP" in the last 168 hours. D-Dimer No results for input(s): "DDIMER" in the last 72 hours. Hgb A1c No results for input(s): "HGBA1C" in the  last 72 hours. Lipid Profile No results for input(s): "CHOL", "HDL", "LDLCALC", "TRIG", "CHOLHDL", "LDLDIRECT" in the last 72 hours. Thyroid function studies No results for input(s): "TSH", "T4TOTAL", "T3FREE", "THYROIDAB" in the last 72 hours.  Invalid input(s): "FREET3" Anemia work up No results for input(s): "VITAMINB12", "FOLATE", "FERRITIN", "TIBC", "IRON", "RETICCTPCT" in the last 72 hours. Urinalysis    Component Value Date/Time   COLORURINE YELLOW (A) 11/04/2023 1408   APPEARANCEUR HAZY (A) 11/04/2023 1408   APPEARANCEUR Cloudy (A) 10/23/2023 1417   LABSPEC 1.013 11/04/2023 1408   PHURINE 5.0 11/04/2023 1408   GLUCOSEU NEGATIVE 11/04/2023 1408   HGBUR LARGE (A) 11/04/2023 1408   BILIRUBINUR NEGATIVE 11/04/2023 1408   BILIRUBINUR Negative 10/23/2023 1417   KETONESUR NEGATIVE 11/04/2023 1408   PROTEINUR 100 (A) 11/04/2023 1408   NITRITE NEGATIVE 11/04/2023 1408   LEUKOCYTESUR LARGE (A) 11/04/2023 1408   Sepsis Labs Recent Labs  Lab 11/04/23 1402 11/05/23 0441 11/06/23 0635  WBC 5.4 4.3 5.1   Microbiology Recent Results (from the past 240 hours)  Urine Culture     Status: Abnormal   Collection Time: 11/04/23  2:08 PM   Specimen: Urine, Clean Catch  Result Value Ref Range Status   Specimen Description   Final    URINE, CLEAN CATCH Performed at [facility], [address], [place], [place] [address]    Special Requests   Final    NONE Performed at [facility], [address] [place], [place] [address]  Culture (A)  Final    <10,000 COLONIES/mL INSIGNIFICANT GROWTH Performed at [facility], [address], [place], [place] [address]    Report Status 11/06/2023 FINAL  Final  Culture, blood (Routine X 2) w Reflex to ID Panel     Status: None (Preliminary result)   Collection Time: 11/04/23 11:07 PM   Specimen: BLOOD  Result Value Ref Range Status   Specimen Description BLOOD BLOOD RIGHT ARM  Final   Special Requests   Final     BOTTLES DRAWN AEROBIC AND ANAEROBIC Blood Culture adequate volume   Culture   Final    NO GROWTH 2 DAYS Performed at [facility], [address], [place], [place] [address]    Report Status PENDING  Incomplete  Culture, blood (Routine X 2) w Reflex to ID Panel     Status: None (Preliminary result)   Collection Time: 11/04/23 11:07 PM   Specimen: BLOOD  Result Value Ref Range Status   Specimen Description BLOOD BLOOD LEFT ARM  Final   Special Requests   Final    BOTTLES DRAWN AEROBIC AND ANAEROBIC Blood Culture adequate volume   Culture   Final    NO GROWTH 2 DAYS Performed at [facility], [address] [place], [place] [address]    Report Status PENDING  Incomplete  SARS Coronavirus 2 by RT PCR (hospital order, performed in [facility] hospital lab) *cepheid single result test* Anterior Nasal Swab     Status: None   Collection Time: 11/04/23 11:35 PM   Specimen: Anterior Nasal Swab  Result Value Ref Range Status   SARS Coronavirus 2 by RT PCR NEGATIVE NEGATIVE Final    Comment: (NOTE) SARS-CoV-2 target nucleic acids are NOT DETECTED.  The SARS-CoV-2 RNA is generally detectable in upper and lower respiratory specimens during the acute phase of infection. The lowest concentration of SARS-CoV-2 viral copies this assay can detect is 250 copies / mL. A negative result does not preclude SARS-CoV-2 infection and should not be used as the sole basis for treatment or other patient management decisions.  A negative result may occur with improper specimen collection / handling, submission of specimen other than nasopharyngeal swab, presence of viral mutation(s) within the areas targeted by this assay, and inadequate number of viral copies (<250 copies / mL). A negative result must be combined with clinical observations, patient history, and epidemiological information.  Fact Sheet for Patients:   RoadLapTop.co.za  Fact Sheet for  Healthcare Providers: http://kim-miller.com/  This test is not yet approved or  cleared by the [place] FDA and has been authorized for detection and/or diagnosis of SARS-CoV-2 by FDA under an Emergency Use Authorization (EUA).  This EUA will remain in effect (meaning this test can be used) for the duration of the COVID-19 declaration under Section 564(b)(1) of the Act, 21 U.S.C. section 360bbb-3(b)(1), unless the authorization is terminated or revoked sooner.  Performed at [facility], [address] [place], [place] [address]      Total time spend on discharging this patient, including the last patient exam, discussing the hospital stay, instructions for ongoing care as it relates to all pertinent caregivers, as well as preparing the medical discharge records, prescriptions, and/or referrals as applicable, is 50 minutes.    Darlin Priestly, MD  [facility] 11/06/2023, 3:34 PM

## 2023-11-06 NOTE — TOC Initial Note (Addendum)
Transition of Care [facility]) - Initial/Assessment Note    Patient Details  Name: Ronnie Mann MRN: [id number] Date of Birth: [birth year]  Transition of Care [facility]) CM/SW Contact:    Ronnie Liner, LCSW Phone Number: 11/06/2023, 10:36 AM  Clinical Narrative:  CSW met with patient. No supports at bedside. CSW introduced role and explained that PT recommendations would be discussed. Patient is agreeable to home health PT but confirmed he does not have an PCP yet. He has an appointment with a new provider in [place] on 1/6. No home health agency preference. Ronnie Mann does not accept his [facility] plan. CSW asked Ronnie Mann to review. Patient is agreeable to RW but declined 3-in-1. No further concerns. CSW encouraged patient to contact CSW as needed. CSW will continue to follow patient for support and facilitate return home once stable. His son will likely transport him home at discharge.             12:23 pm: Ronnie Mann has accepted the home health referral for PT. Discussed case with Dr. Sherryll Burger. He provided contact information for the director on call office and agreed to cover the orders if Ronnie Mann was unable to coordinate with the director on call.  Expected Discharge Plan: Home w Home Health Services Barriers to Discharge: Continued Medical Work up   Patient Goals and CMS Choice   CMS Medicare.gov Compare Post Acute Care list provided to:: Patient        Expected Discharge Plan and Services     Post Acute Care Choice: Home Health, Durable Medical Equipment Living arrangements for the past 2 months: Single Family Home                                      Prior Living Arrangements/Services Living arrangements for the past 2 months: Single Family Home Lives with:: Adult Children Patient language and need for interpreter reviewed:: Yes Do you feel safe going back to the place where you live?: Yes      Need for Family Participation in Patient Care: Yes (Comment) Care giver support system in  place?: Yes (comment)   Criminal Activity/Legal Involvement Pertinent to Current Situation/Hospitalization: No - Comment as needed  Activities of Daily Living   ADL Screening (condition at time of admission) Independently performs ADLs?: Yes (appropriate for developmental age) Is the patient deaf or have difficulty hearing?: No Does the patient have difficulty seeing, even when wearing glasses/contacts?: No Does the patient have difficulty concentrating, remembering, or making decisions?: No  Permission Sought/Granted Permission sought to share information with : Facility Industrial/product designer granted to share information with : Yes, Verbal Permission Granted     Permission granted to share info w AGENCY: Home Health Agencies        Emotional Assessment Appearance:: Appears stated age Attitude/Demeanor/Rapport: Engaged, Gracious Affect (typically observed): Accepting, Appropriate, Calm, Pleasant Orientation: : Oriented to Self, Oriented to Place, Oriented to  Time, Oriented to Situation Alcohol / Substance Use: Not Applicable Psych Involvement: No (comment)  Admission diagnosis:  Right flank pain [R10.9] Calculus of gallbladder without cholecystitis without obstruction [K80.20] Patient Active Problem List   Diagnosis Date Noted   Right flank pain 11/04/2023   Normocytic anemia 11/04/2023   Acute pyelonephritis 11/04/2023   Fall at home, initial encounter 11/04/2023   Cough 11/04/2023   Ureteral stone with hydronephrosis 11/04/2023   Acute bronchitis 10/11/2023   Myocardial injury 10/10/2023  HLD (hyperlipidemia) 10/10/2023   CAD (coronary artery disease) 10/10/2023   Chronic kidney disease, stage 3b (HCC) 10/10/2023   Obesity (BMI 30-39.9) 10/10/2023   Complicated UTI (urinary tract infection) 10/10/2023   Hydronephrosis 10/10/2023   Cardiomyopathy (HCC) 05/20/2023   CAD in native artery 05/20/2023   Chronic systolic CHF (congestive heart failure) (HCC)  05/17/2023   Gross hematuria 05/16/2023   UTI symptoms 05/16/2023   Symptomatic anemia 05/16/2023   Hematuria 05/16/2023   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 heart with stable angina pectoris (HCC) 05/04/2023   Ischemic cardiomyopathy 05/03/2023   Acute hyperglycemia 05/02/2023   Acute systolic heart failure (HCC) 05/01/2023   Non-ST elevation (NSTEMI) myocardial infarction (HCC) 04/29/2023   CHF (congestive heart failure) (HCC) 04/28/2023   Elevated troponin 04/28/2023   Substernal chest pain 04/28/2023   Right nephrolithiasis 04/28/2023   Lactic acidosis 04/28/2023   AKI (acute kidney injury) (HCC) 04/28/2023   Hydronephrosis of left kidney 04/28/2023   Wheezing 04/28/2023   Uncontrolled type 2 diabetes mellitus with hyperglycemia, without long-term current use of insulin (HCC) 04/28/2023   Essential hypertension 04/28/2023   Hiatal hernia 04/28/2023   Cholelithiasis 04/28/2023   Shortness of breath 04/28/2023   Viral URI 04/28/2023   PCP:  Pcp, No Pharmacy:   [facility] [address] [place] [address] Phone: [phone] Fax: [phone]  [facility] [address] [place], [place] - [address] [place] [address] Phone: [phone] Fax: [phone]     Social Drivers of Health (SDOH) Social History: SDOH Screenings   Food Insecurity: No Food Insecurity (11/05/2023)  Housing: Low Risk  (11/05/2023)  Recent Concern: Housing - Medium Risk (10/11/2023)  Transportation Needs: No Transportation Needs (11/05/2023)  Utilities: Not At Risk (11/05/2023)  Financial Resource Strain: Low Risk  (10/12/2023)  Tobacco Use: Low Risk  (11/04/2023)   SDOH Interventions: Housing Interventions: Intervention Not Indicated   Readmission Risk Interventions     No data to display

## 2023-11-06 NOTE — Telephone Encounter (Signed)
Pt is hospitalized but Nason is going to stop by at his appt time

## 2023-11-06 NOTE — TOC Transition Note (Signed)
Transition of Care [facility]) - Discharge Note   Patient Details  Name: Ronnie Mann MRN: [id number] Date of Birth: [birth year]  Transition of [facility]) CM/SW Contact:  Margarito Liner, LCSW Phone Number: 11/06/2023, 3:53 PM   Clinical Narrative:  Patient has orders to discharge home today. CSW left message for [facility] liaison to notify. CSW ordered RW through [employer]. No further concerns. CSW signing off.   Final next level of care: Home w Home Health Services Barriers to Discharge: Barriers Resolved   Patient Goals and CMS Choice   CMS Medicare.gov Compare Post Acute Care list provided to:: Patient        Discharge Placement                Patient to be transferred to facility by: Son   Patient and family notified of of transfer: 11/06/23  Discharge Plan and Services Additional resources added to the After Visit Summary for       Post Acute Care Choice: Home Health, Durable Medical Equipment          DME Arranged: Dan Humphreys rolling DME Agency: [employer] Date DME Agency Contacted: 11/06/23   Representative spoke with at DME Agency: Yvone Neu HH Arranged: PT HH Agency: [facility] Date [facility] Agency Contacted: 11/06/23   Representative spoke with at [facility] Agency: Lorenza Chick  Social Drivers of Health (SDOH) Interventions SDOH Screenings   Food Insecurity: No Food Insecurity (11/05/2023)  Housing: Low Risk  (11/05/2023)  Recent Concern: Housing - Medium Risk (10/11/2023)  Transportation Needs: No Transportation Needs (11/05/2023)  Utilities: Not At Risk (11/05/2023)  Financial Resource Strain: Low Risk  (10/12/2023)  Tobacco Use: Low Risk  (11/04/2023)     Readmission Risk Interventions    11/06/2023   12:27 PM  Readmission Risk Prevention Plan  Transportation Screening Complete  PCP or Specialist Appt within 3-5 Days Complete  HRI or Home Care Consult Complete  Social Work Consult for Recovery Care Planning/Counseling Complete   Palliative Care Screening Not Applicable

## 2023-11-06 NOTE — Progress Notes (Addendum)
Mobility Specialist - Progress Note   11/06/23 1100  Mobility  Activity Stood at bedside;Dangled on edge of bed  Level of Assistance Contact guard assist, steadying assist  Assistive Device Other (Comment) (S HHA)  Activity Response Tolerated well  Mobility visit 1 Mobility     Pt lying in bed upon arrival, utilizing RA. Pt requesting assistance to use urinal. Completed bed mobility modI. STS x2 with CGA. Pt reports pain in R flank area + dizziness in standing and when returned to semi-supine. Voices seeing "glowing spectacles". Further activity deferred. BP checked 102/61 with 88 HR and 93% O2. Pt left in bed with alarm set, needs in reach. RN notified.    Ronnie Mann Mobility Specialist 11/06/23, 11:12 AM

## 2023-11-06 NOTE — Evaluation (Signed)
Occupational Therapy Evaluation Patient Details Name: Ronnie Mann MRN: [id number] DOB: Aug 08, 1956 Today's Date: 11/06/2023   History of Present Illness Ronnie Mann is a 67 y.o. male with medical history significant of right hydronephrosis and right ureteral stone (s/p of recent stent placement), sCHF with EF 25%, HTN, HLD, CAD with stent, CKD-3a, anemia, hiatal hernia, obesity, who presents with right flank pain.   Clinical Impression   Ronnie Mann was seen for OT evaluation this date. Prior to hospital admission, pt was IND. Pt lives with son. Pt currently requires SBA + RW for ADL t/f ~60 ft, multiple standing rest breaks required 2/2 abdominal pain (RN notified). MOD I for LB and UB dressing in sitting and no assist for bed mobility. Pt endorses near baseline for ADLs, no skilled acute OT Needs identified, will sign off. Upon hospital discharge, recommend no OT follow up.    If plan is discharge home, recommend the following: A little help with walking and/or transfers;Help with stairs or ramp for entrance    Functional Status Assessment  Patient has had a recent decline in their functional status and demonstrates the ability to make significant improvements in function in a reasonable and predictable amount of time.  Equipment Recommendations  BSC/3in1;Other (comment) (RW)    Recommendations for Other Services       Precautions / Restrictions Precautions Precautions: None Restrictions Weight Bearing Restrictions Per Provider Order: No      Mobility Bed Mobility Overal bed mobility: Modified Independent             General bed mobility comments: HOB elevated    Transfers Overall transfer level: Needs assistance Equipment used: Rolling walker (2 wheels) Transfers: Sit to/from Stand Sit to Stand: Supervision                  Balance Overall balance assessment: Needs assistance Sitting-balance support: No upper extremity supported Sitting balance-Leahy  Scale: Normal     Standing balance support: Single extremity supported, During functional activity Standing balance-Leahy Scale: Fair                             ADL either performed or assessed with clinical judgement   ADL Overall ADL's : Needs assistance/impaired                                       General ADL Comments: SBA + RW for simulated toilet t/f. MOD I for LB and UB dressing in sitting.      Pertinent Vitals/Pain Pain Assessment Pain Assessment: 0-10 Pain Score: 7  Pain Location: abdomen Pain Descriptors / Indicators: Aching Pain Intervention(s): Limited activity within patient's tolerance, Patient requesting pain meds-RN notified     Extremity/Trunk Assessment Upper Extremity Assessment Upper Extremity Assessment: Overall WFL for tasks assessed   Lower Extremity Assessment Lower Extremity Assessment: Overall WFL for tasks assessed       Communication     Cognition Arousal: Alert Behavior During Therapy: WFL for tasks assessed/performed Overall Cognitive Status: Within Functional Limits for tasks assessed                                                  Home Living Family/patient expects to  be discharged to:: Private residence Living Arrangements: Children Available Help at Discharge: Family;Available PRN/intermittently Type of Home: House Home Access: Stairs to enter [employer] of Steps: 2   Home Layout: One level         Bathroom Toilet: Standard     Home Equipment: Cane - single point          Prior Functioning/Environment Prior Level of Function : Independent/Modified Independent;Driving             Mobility Comments: son helps only when the patient needs it. He reports driving only when not on medication that makes him drowsy          OT Problem List: Decreased activity tolerance;Pain         OT Goals(Current goals can be found in the care plan section) Acute  Rehab OT Goals Patient Stated Goal: go home OT Goal Formulation: With patient Time For Goal Achievement: 11/06/23 Potential to Achieve Goals: Good   AM-PAC OT "6 Clicks" Daily Activity     Outcome Measure Help from another person eating meals?: None Help from another person taking care of personal grooming?: A Little Help from another person toileting, which includes using toliet, bedpan, or urinal?: A Little Help from another person bathing (including washing, rinsing, drying)?: A Little Help from another person to put on and taking off regular upper body clothing?: None Help from another person to put on and taking off regular lower body clothing?: None 6 Click Score: 21   End of Session Equipment Utilized During Treatment: Rolling walker (2 wheels) Nurse Communication: Patient requests pain meds  Activity Tolerance: Patient tolerated treatment well Patient left: in bed;with call bell/phone within reach  OT Visit Diagnosis: Other abnormalities of gait and mobility (R26.89)                Time: 4098-1191 OT Time Calculation (min): 13 min Charges:  OT General Charges $OT Visit: 1 Visit OT Evaluation $OT Eval Moderate Complexity: 1 Mod  Kathie Dike, M.S. OTR/L  11/06/23, 9:26 AM  ascom [phone]

## 2023-11-07 ENCOUNTER — Other Ambulatory Visit: Payer: Medicare HMO

## 2023-11-07 ENCOUNTER — Other Ambulatory Visit: Payer: Self-pay | Admitting: Pharmacist

## 2023-11-07 NOTE — Progress Notes (Deleted)
Advanced Heart Failure Clinic Note  PCP: Pcp, No  PCP-Cardiologist: Julien Nordmann, MD  HF-Cardiologist: Dr. Dorthula Nettles  HPI:  Ronnie Mann is a 67 y/o male with a history of NSTEMI, s/p PCI to proximal LAD and RCA on 05/01/23, HFrEF(EF 30 to 35%), previous tobacco/ alcohol use, kidney stones since the age of 37 and bilateral nephrolithiasis.   Admitted 04/28/23 due to new onset of substernal "vice like" substernal chest pain that radiated to his left arm and shortness of breath. Developed flash pulmonary edema during left heart cath 06/10 and was treated with nitroglycerin drip. Status post successful balloon angioplasty to the proximal LAD and drug-eluting stent placement to the proximal right coronary artery.   Echo 04/30/23 showed LVEF 30-35% along with mild LVH and Grade II DD and moderate Ronnie. Seen by Advanced HF team during admission on 05/03/23.  Visited ED 05/13/23 due to bilateral flank and suprapubic pain. CT renal was stable.   Admitted 05/16/23 due to shortness of breath, bilateral flank pain radiating towards lower abdomen, more on right than left, dysuria and gross hematuria. Patient deferred urology stent placement. Able to void after foley removed. Brilinta was switched to Plavix, aspirin discontinued in the setting of hematuria by cardiologist     Seen in CHF clinic on 06/09/23 where losartan 25 mg daily was added, however losartan was stopped shortly after due to complaints of chest pain/palpitations.   CHF clinic on 06/30/23 where spironolactone 12.5 mg daily was added and metoprolol was increased to 25 mg daily.   Seen in CHF pharmacy clinic on 07/24/23 where patient reported accidentally increasing metoprolol to 37.5 mg daily and began to feel poorly. The dose was reduced back to 25 mg daily as intended and Entresto 24-26 mg bid was started.   Seen by CHF pharmacy 08/30/23 where spironolactone was started.  Admitted 10/10/23 with bronchitis and kidney stone requiring stent.  Seen by Clarisa Kindred 10/23/23 where the patient was confused about taking losartan vs Entresto. Sherryll Burger was resumed and losartan was stopped.  Today Ronnie Mann returns to Heart Failure Clinic for pharmacist medication titration. Reports feeling relatively poor due to cold-like symptoms. Reports coughing up yellow phlegm with nasal congestion, fatigue, worsening shortness of breath and orthopnea over the last week. Symptoms are leading to worsening of sleep quality. Also reports left arm soreness different from that of his ACS event. Denies chest pain, LEE, orthostasis, PND, and dyspnea. Reports being able to complete all activities of daily living (ADLs) with breaks. Is minimally active throughout the day. Weight at home is around 210 pounds, but has has not weighed in ~1 month. Takes no loop diuretic. Appetite has good. Does follow a low sodium diet. ReDs today is 26%.  Current HF Medications: Beta Blocker: Metoprolol succinate 25 mg daily ACEI/ARB/ARNI: Entresto 24-26 mg bid Mineralocorticoid Receptor Antagonist (MRA): Spironolactone 25 mg daily  Has the patient been experiencing any side effects to the medications prescribed? No current ADEs. Reports feeling "hyper with racing heart" when he took losartan. Denies any swelling, rash, or other allergic symptoms. Experienced lightheadedness when accidentally taking too much of his metoprolol.  Does the patient have any problems obtaining medications due to transportation or finances? No  Understanding of regimen: Fair  Understanding of indications: Fair  Potential of adherence: Fair. Reports taking mediations as directed now, except he missed all morning medications today.  Patient understands to avoid NSAIDs.  Patient understands to avoid decongestants. Noted allergy symptoms today and recommended Zyrtec 10  mg daily.  Pertinent Lab Values: Creatinine, Ser  Date Value Ref Range Status  11/06/2023 1.48 (H) 0.61 - 1.24 mg/dL Final   BUN   Date Value Ref Range Status  11/06/2023 42 (H) 8 - 23 mg/dL Final  60/45/4098 26 8 - 27 mg/dL Final   Potassium  Date Value Ref Range Status  11/06/2023 4.8 3.5 - 5.1 mmol/L Final   Sodium  Date Value Ref Range Status  11/06/2023 137 135 - 145 mmol/L Final  10/23/2023 139 134 - 144 mmol/L Final   B Natriuretic Peptide  Date Value Ref Range Status  11/04/2023 198.6 (H) 0.0 - 100.0 pg/mL Final    Comment:    Performed at [facility], [address] [place], [place] [address]   Magnesium  Date Value Ref Range Status  10/11/2023 2.0 1.7 - 2.4 mg/dL Final    Comment:    Performed at [facility], [address] [place], [place] [address]   TSH  Date Value Ref Range Status  04/28/2023 4.483 0.350 - 4.500 uIU/mL Final    Comment:    Performed by a 3rd Generation assay with a functional sensitivity of <=0.01 uIU/mL. Performed at [facility], [address], [place], [place] [address]     Vital Signs: There were no vitals filed for this visit.  Assessment/Plan: 1: Heart failure with reduced ejection fraction due to ischemic cardiomyopathy. - NSTEMI with PCI 04/2023 - NYHA class III. Able to complete all ADLs, but is having shortness of breath with minimal exertion - Euvolemic. REDS 26% in clinic today. Is not weighing daily. Encouraged to start and reminded to call for an overnight weight gain of > 2 pounds or a weekly weight gain of > 5 pounds. Weight today similar to last clinic visit. - Echo 04/30/23: EF 30-35% along with mild LVH and Grade II DD and moderate Ronnie.  - Continue metoprolol 25 mg daily  - Continue spironolactone 25 mg daily.  - Continue Entresto 24-26 mg. Tolerating well. Verified patient does not have bilateral renal artery stenosis or renal tubes. - Due to chronic kidney stones etc, may not be a good candidate for SGLT2 - BNP 05/16/23 was 245.5. Recheck BNP and BMP today.   2: CAD- - continue atorvastatin 40mg  daily  -  continue ASA 81mg  daily - LHC 05/01/23:   Prox LAD to Mid LAD lesion is 99% stenosed.   Mid LAD lesion is 60% stenosed.   1st Diag lesion is 70% stenosed.   Prox RCA lesion is 95% stenosed.   Mid RCA lesion is 30% stenosed.   RPDA lesion is 60% stenosed.   Dist LAD lesion is 99% stenosed.   A drug-eluting stent was successfully placed using a STENT ONYX FRONTIER 4.0X15.   Balloon angioplasty was performed using a BALLN [place] EUPHORA RX 2.75X20.   Post intervention, there is a 20% residual stenosis.   Post intervention, there is a 0% residual stenosis.   There is severe left ventricular systolic dysfunction.   LV end diastolic pressure is severely elevated.   There is moderate (3+) mitral regurgitation.  1.  Severe two-vessel coronary artery disease with subtotal occlusion of the proximal LAD, diffuse moderate mid LAD disease and severe distal LAD disease.  In addition, there is 95% in the proximal right coronary artery with faint right to left collaterals to the LAD.  The left circumflex has mild nonobstructive disease and OM 3 is large and reaches the apex. 2.  Severely  reduced LV systolic function with an EF of 25%.  Severely elevated left ventricular end-diastolic pressure at 34 mmHg. 3.  Successful balloon angioplasty to the proximal LAD and drug-eluting stent placement to the proximal right coronary artery.   3: Kidney disease- - Has bilateral nonobstructive staghorn renal stones. No history of renal artery stenosis. - Seeing urology in ~1 week per patient report   4: HTN- - BP above goal today., however patient has not taken any medications. - See above   5. Community Acquired Pneumonia- - Evaluated by NP today and symptoms/lung sounds consistent with PNA.  - NP prescribed 5 days of cefadroxil and Z Pak  Follow up: with primary cardiology in ~3 months  Thank you for involving pharmacy in this patient's care.  Enos Fling, PharmD, BCPS Phone - [phone] Clinical  Pharmacist 11/07/2023 11:03 AM

## 2023-11-07 NOTE — Telephone Encounter (Signed)
Patient was discharged and remains scheduled today. Called to confirm appointment with no reply.

## 2023-11-09 LAB — CULTURE, BLOOD (ROUTINE X 2)
Culture: NO GROWTH
Culture: NO GROWTH
Special Requests: ADEQUATE
Special Requests: ADEQUATE

## 2023-11-13 ENCOUNTER — Ambulatory Visit: Payer: Medicare HMO | Attending: Family | Admitting: Family

## 2023-11-13 ENCOUNTER — Encounter: Payer: Self-pay | Admitting: Family

## 2023-11-13 VITALS — BP 125/77 | HR 61 | Wt 203.0 lb

## 2023-11-13 DIAGNOSIS — I5022 Chronic systolic (congestive) heart failure: Secondary | ICD-10-CM

## 2023-11-13 DIAGNOSIS — Z955 Presence of coronary angioplasty implant and graft: Secondary | ICD-10-CM | POA: Diagnosis not present

## 2023-11-13 DIAGNOSIS — N189 Chronic kidney disease, unspecified: Secondary | ICD-10-CM | POA: Insufficient documentation

## 2023-11-13 DIAGNOSIS — Z87442 Personal history of urinary calculi: Secondary | ICD-10-CM | POA: Insufficient documentation

## 2023-11-13 DIAGNOSIS — R5383 Other fatigue: Secondary | ICD-10-CM | POA: Diagnosis present

## 2023-11-13 DIAGNOSIS — E785 Hyperlipidemia, unspecified: Secondary | ICD-10-CM | POA: Diagnosis not present

## 2023-11-13 DIAGNOSIS — I10 Essential (primary) hypertension: Secondary | ICD-10-CM | POA: Diagnosis not present

## 2023-11-13 DIAGNOSIS — Z87440 Personal history of urinary (tract) infections: Secondary | ICD-10-CM | POA: Diagnosis not present

## 2023-11-13 DIAGNOSIS — N200 Calculus of kidney: Secondary | ICD-10-CM

## 2023-11-13 DIAGNOSIS — I252 Old myocardial infarction: Secondary | ICD-10-CM | POA: Diagnosis not present

## 2023-11-13 DIAGNOSIS — I2510 Atherosclerotic heart disease of native coronary artery without angina pectoris: Secondary | ICD-10-CM | POA: Diagnosis not present

## 2023-11-13 DIAGNOSIS — I130 Hypertensive heart and chronic kidney disease with heart failure and stage 1 through stage 4 chronic kidney disease, or unspecified chronic kidney disease: Secondary | ICD-10-CM | POA: Diagnosis not present

## 2023-11-13 NOTE — Progress Notes (Signed)
PCP: none Primary Cardiologist: Ronnie Nordmann, MD (saw during 06/24 admission)  Chief Complaint: fatigue   HPI:  Ronnie Mann is a 67 y/o male with a history of NSTEMI, s/p PCI to proximal LAD and RCA on 05/01/23, HFrEF(EF 30 to 35%), previous tobacco/ alcohol use, HTN, CKD, UTI, hyperlipidemia, kidney stones since the age of 8, right uretal stone s/p stent 10/12/23 and bilateral nephrolithiasis.   Admitted 04/28/23 due to new onset of substernal "vice like" substernal chest pain that radiated to his left arm. He reports ongoing shortness of breath associated with this. He also was awoken from sleep with this last night and had broken out into a cold sweat. Elevated lactic acid and elevated WBC at 14.4. 1 L of IVF given. Initial troponin was 54 rose to 234. 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 Developed flash pulmonary edema during left heart cath 06/10 and was treated with nitroglycerin drip. Diuretics held. Status post successful balloon angioplasty to the proximal LAD and drug-eluting stent placement to the proximal right coronary artery. Treated for pneumonia with antibiotics. Was in the ED 05/13/23 due to bilateral flank and suprapubic pain. CT renal was stable. Admitted 05/16/23 due to shortness of breath, bilateral flank pain radiating towards lower abdomen, more on right than left, dysuria and gross hematuria. Patient deferred urology stent placement. Able to void after foley removed. Brilinta was switched to Plavix, aspirin discontinued in the setting of hematuria by cardiologist    Admitted 10/10/23 due to shortness of breath, hematuria and flank pain. Started on IV lasix for HF exacerbation. Cardiology consulted and feel respiratory symptoms more related to bronchitis. Lasix held and solu-medrol provided. Respiratory panel negative. Urology consulted and stent placed. Losartan was changed to entresto.   Admitted 11/04/23 due to right flank pain. RUQ  sono showed cholelithiasis with positive sonographic Murphy's borderline thickening of gallbladder wall at fundus suspicious for acute cholecystitis.  CT renal stone study showed right ureteral stent in position, persistent calculi in right ureter, pelvis and proximal mid left ureter.  Surgery team evaluated the patient in the ED, advised HIDA scan which ruled out acute cholecystitis. Entresto, lasix and spironolactone held due to low BP. To follow up with [facility] urology early January 2025.  Echo 04/30/23: EF 30-35% along with mild LVH and Grade II DD and moderate Ronnie.  Echo 10/13/23: EF 25-30% with moderate LAE, moderate Ronnie  LHC 05/01/23:   Prox LAD to Mid LAD lesion is 99% stenosed.   Mid LAD lesion is 60% stenosed.   1st Diag lesion is 70% stenosed.   Prox RCA lesion is 95% stenosed.   Mid RCA lesion is 30% stenosed.   RPDA lesion is 60% stenosed.   Dist LAD lesion is 99% stenosed.   A drug-eluting stent was successfully placed using a STENT ONYX FRONTIER 4.0X15.   Balloon angioplasty was performed using a BALLN [place] EUPHORA RX 2.75X20.   Post intervention, there is a 20% residual stenosis.   Post intervention, there is a 0% residual stenosis.   There is severe left ventricular systolic dysfunction.   LV end diastolic pressure is severely elevated.   There is moderate (3+) mitral regurgitation.  1.  Severe two-vessel coronary artery disease with subtotal occlusion of the proximal LAD, diffuse moderate mid LAD disease and severe distal LAD disease.  In addition, there is 95% in the proximal right coronary artery with faint right to left collaterals to the LAD.  The left circumflex has mild  nonobstructive disease and OM 3 is large and reaches the apex. 2.  Severely reduced LV systolic function with an EF of 25%.  Severely elevated left ventricular end-diastolic pressure at 34 mmHg. 3.  Successful balloon angioplasty to the proximal LAD and drug-eluting stent placement to the proximal right coronary  artery.  He presents today for a post- hospital HF f/u visit with a chief complaint of moderate fatigue with little exertion. Chronic in nature and he feels like it's related to his chronic pain that he's in. Has associated minimal shortness of breath along with occasional chest pain/ palpitations, light-headedness and trouble sleeping due to his kidney pain. Denies cough, abdominal distention or pedal edema.   His furosemide, entresto and spironolactone are all currently on hold due to low BP. Has [facility] urology appointment early January 2025 to address his kidney stent pain. Son drove him today because he had taken his pain medication and it makes him a little sleepy.   Furosemide, entresto and spironolactone were held during recent admission and continue to be held.   ROS: All systems negative except as listed in HPI, PMH and Problem List.  SH:  Social History   Socioeconomic History   Marital status: Divorced    Spouse name: Not on file   Number of children: Not on file   Years of education: Not on file   Highest education level: 10th grade  Occupational History   Not on file  Tobacco Use   Smoking status: Never    Passive exposure: Never   Smokeless tobacco: Never  Vaping Use   Vaping status: Never Used  Substance and Sexual Activity   Alcohol use: Never   Drug use: Yes    Types: Marijuana   Sexual activity: Not on file  Other Topics Concern   Not on file  Social History Narrative   Not on file   Social Drivers of Health   Financial Resource Strain: Low Risk  (10/12/2023)   Overall Financial Resource Strain (CARDIA)    Difficulty of Paying Living Expenses: Not hard at all  Food Insecurity: No Food Insecurity (11/05/2023)   Hunger Vital Sign    Worried About Running Out of Food in the Last Year: Never true    Ran Out of Food in the Last Year: Never true  Transportation Needs: No Transportation Needs (11/05/2023)   PRAPARE - Administrator, Civil Service  (Medical): No    Lack of Transportation (Non-Medical): No  Physical Activity: Not on file  Stress: Not on file  Social Connections: Not on file  Intimate Partner Violence: Not At Risk (11/05/2023)   Humiliation, Afraid, Rape, and Kick questionnaire    Fear of Current or Ex-Partner: No    Emotionally Abused: No    Physically Abused: No    Sexually Abused: No    FH:  Family History  Problem Relation Age of Onset   Heart disease Father     Past Medical History:  Diagnosis Date   Coronary artery disease 05/01/2023   PCI/DES   HFrEF (heart failure with reduced ejection fraction) (HCC) 05/01/2023   Hiatal hernia    HLD (hyperlipidemia)    HTN (hypertension)    Ischemic cardiomyopathy 05/01/2023   LVEF 30-35%   Kidney stones     Current Outpatient Medications  Medication Sig Dispense Refill   albuterol (VENTOLIN HFA) 108 (90 Base) MCG/ACT inhaler Inhale 2 puffs into the lungs every 6 (six) hours as needed for wheezing or shortness of  breath. 17 each 0   aspirin EC 81 MG tablet Take 81 mg by mouth daily. Swallow whole.     atorvastatin (LIPITOR) 40 MG tablet Take 1 tablet (40 mg total) by mouth daily. 30 tablet 0   [Paused] furosemide (LASIX) 20 MG tablet Take 1 tablet (20 mg total) by mouth daily. 30 tablet 0   metoprolol succinate (TOPROL-XL) 25 MG 24 hr tablet Take 1 tablet (25 mg total) by mouth daily. 90 tablet 3   oxybutynin (DITROPAN) 5 MG tablet Take 1 tablet (5 mg total) by mouth 3 (three) times daily. 90 tablet 0   oxyCODONE-acetaminophen (PERCOCET/ROXICET) 5-325 MG tablet Take 1 tablet by mouth every 8 (eight) hours as needed for severe pain (pain score 7-10). 30 tablet 0   polyethylene glycol (MIRALAX / GLYCOLAX) 17 g packet Take 17 g by mouth daily. 30 each 0   [Paused] sacubitril-valsartan (ENTRESTO) 24-26 MG Take 1 tablet by mouth 2 (two) times daily. 60 tablet 3   [Paused] spironolactone (ALDACTONE) 25 MG tablet Take 1 tablet (25 mg total) by mouth daily. 45 tablet  3   tamsulosin (FLOMAX) 0.4 MG CAPS capsule Take 1 capsule (0.4 mg total) by mouth daily after supper. 30 capsule 2   No current facility-administered medications for this visit.   Vitals:   11/13/23 1358  BP: 125/77  Pulse: 61  SpO2: 100%  Weight: 203 lb (92.1 kg)   Wt Readings from Last 3 Encounters:  11/13/23 203 lb (92.1 kg)  11/06/23 196 lb 3.4 oz (89 kg)  10/23/23 201 lb (91.2 kg)   Lab Results  Component Value Date   CREATININE 1.48 (H) 11/06/2023   CREATININE 1.55 (H) 11/05/2023   CREATININE 1.32 (H) 11/04/2023   PHYSICAL EXAM:  General:  Appears uncomfortable. No resp difficulty HEENT: normal Neck: supple. JVP flat. No lymphadenopathy or thryomegaly appreciated. Cor: PMI normal. Regular rate & rhythm. No rubs, gallops or murmurs. Lungs: clear Abdomen: soft, nontender, nondistended. No hepatosplenomegaly. No bruits or masses.  Extremities: no cyanosis, clubbing, rash, edema Neuro: alert & oriented x3, cranial nerves grossly intact. Moves all 4 extremities w/o difficulty. Affect pleasant.   ECG: not done   ASSESSMENT & PLAN:  1: Ischemic heart failure with reduced ejection fraction- - NSTEMI with PCI 06/24 - NYHA class II - euvolemic - weighing daily; reminded to call for an overnight weight gain of > 2 pounds or a weekly weight gain of > 5 pounds - weight up 2 pounds from last visit here 3 weekis ago - Echo 04/30/23: EF 30-35% along with mild LVH and Grade II DD and moderate Ronnie.  - Echo 10/13/23: EF 25-30% with moderate LAE, moderate Ronnie - continue metoprolol succinate 25mg  daily - continue holding furosemide, entresto & spironolactone; they were held during recent admission due to hypotension - if weight starts to rise, SOB worsens or swelling occurs, he can resume his furosemide over this upcoming Christmas holiday - due to chronic kidney stone; may not be a good candidate for SGLT2 - BNP 11/04/23 was 198.6  2: CAD- - was  NS with cardiology Mariah Mann)  08/24; this will need to be R/S - continue atorvastatin 40mg  daily - continue ASA 81mg  daily - LHC 05/01/23:   Prox LAD to Mid LAD lesion is 99% stenosed.   Mid LAD lesion is 60% stenosed.   1st Diag lesion is 70% stenosed.   Prox RCA lesion is 95% stenosed.   Mid RCA lesion is 30% stenosed.   RPDA  lesion is 60% stenosed.   Dist LAD lesion is 99% stenosed.   A drug-eluting stent was successfully placed using a STENT ONYX FRONTIER 4.0X15.   Balloon angioplasty was performed using a BALLN [place] EUPHORA RX 2.75X20.   Post intervention, there is a 20% residual stenosis.   Post intervention, there is a 0% residual stenosis.   There is severe left ventricular systolic dysfunction.   LV end diastolic pressure is severely elevated.   There is moderate (3+) mitral regurgitation.  1.  Severe two-vessel coronary artery disease with subtotal occlusion of the proximal LAD, diffuse moderate mid LAD disease and severe distal LAD disease.  In addition, there is 95% in the proximal right coronary artery with faint right to left collaterals to the LAD.  The left circumflex has mild nonobstructive disease and OM 3 is large and reaches the apex. 2.  Severely reduced LV systolic function with an EF of 25%.  Severely elevated left ventricular end-diastolic pressure at 34 mmHg. 3.  Successful balloon angioplasty to the proximal LAD and drug-eluting stent placement to the proximal right coronary artery.  3: Kidney disease- - saw urology Ronnie Mann) 12/24; has [facility] urology appt 01/25 - has bilateral staghorn renal stones  - right ureteral stent placed 10/12/23 & reports being in pain ever since - possible PCNL in the future   4: HTN- - BP 125/77; today's BP is good but pain may be falsely elevating it - has upcoming PCP appt 01/25 - BMP 11/06/23 showed sodium 137, potassium 4.8, creatinine 1.48 & GFR 52   Return in 2 weeks with Pharm to see if any of the held medications can start to be resumed

## 2023-11-27 ENCOUNTER — Other Ambulatory Visit: Payer: Self-pay

## 2023-11-27 ENCOUNTER — Encounter: Payer: Self-pay | Admitting: Internal Medicine

## 2023-11-27 ENCOUNTER — Ambulatory Visit (INDEPENDENT_AMBULATORY_CARE_PROVIDER_SITE_OTHER): Payer: Medicare HMO | Admitting: Internal Medicine

## 2023-11-27 VITALS — BP 128/82 | HR 94 | Temp 97.9°F | Resp 16 | Ht 69.0 in | Wt 210.9 lb

## 2023-11-27 DIAGNOSIS — I252 Old myocardial infarction: Secondary | ICD-10-CM | POA: Diagnosis not present

## 2023-11-27 DIAGNOSIS — I25118 Atherosclerotic heart disease of native coronary artery with other forms of angina pectoris: Secondary | ICD-10-CM

## 2023-11-27 DIAGNOSIS — I10 Essential (primary) hypertension: Secondary | ICD-10-CM

## 2023-11-27 DIAGNOSIS — F5104 Psychophysiologic insomnia: Secondary | ICD-10-CM | POA: Insufficient documentation

## 2023-11-27 DIAGNOSIS — N1831 Chronic kidney disease, stage 3a: Secondary | ICD-10-CM

## 2023-11-27 DIAGNOSIS — Z1211 Encounter for screening for malignant neoplasm of colon: Secondary | ICD-10-CM

## 2023-11-27 DIAGNOSIS — Z122 Encounter for screening for malignant neoplasm of respiratory organs: Secondary | ICD-10-CM

## 2023-11-27 DIAGNOSIS — R7303 Prediabetes: Secondary | ICD-10-CM | POA: Insufficient documentation

## 2023-11-27 DIAGNOSIS — I5022 Chronic systolic (congestive) heart failure: Secondary | ICD-10-CM

## 2023-11-27 DIAGNOSIS — N200 Calculus of kidney: Secondary | ICD-10-CM

## 2023-11-27 MED ORDER — TRAZODONE HCL 50 MG PO TABS
25.0000 mg | ORAL_TABLET | Freq: Every evening | ORAL | 0 refills | Status: DC | PRN
  Filled 2023-11-27: qty 90, 90d supply, fill #0

## 2023-11-27 NOTE — Progress Notes (Signed)
 New Patient Office Visit  Subjective    Patient ID: Ronnie Mann, male    DOB: Apr 30, 1956  Age: 68 y.o. MRN: [id number]  CC:  Chief Complaint  Patient presents with   Establish Care   Flank Pain    Possible kidney stones    HPI [name] presents to establish care.  He has not had a PCP in several years.  Hypertension/HFrEF: -Medications: Metoprolol  XL 25 mg, Entresto , Lasix  20 mg, Spironolactone  25 mg, however the Entresto , Lasix  and spironolactone  has been held since his last hospitalization due to hypotension -Patient is compliant with above medications and reports no side effects. -Denies any SOB, CP, vision changes, LE edema or symptoms of hypotension -Following with Cardiology and HF clinic -Last echo 11/24 EF 25-30% with mild aortic root dilation to 40 mm  HLD/Hx of NSTEMI s/p PCI to proximal LAD and RCA 6/24: -Medications: Lipitor 40 mg, aspirin   -Patient is compliant with above medications and reports no side effects.  -Last lipid panel: Lipid Panel     Component Value Date/Time   CHOL 166 10/11/2023 0610   TRIG 72 10/11/2023 0610   HDL 30 (L) 10/11/2023 0610   CHOLHDL 5.5 10/11/2023 0610   VLDL 14 10/11/2023 0610   LDLCALC 122 (H) 10/11/2023 0610   Pre-Diabetes: -A1c 11/24 6.1% -Not on medications  Recurrent Kidney Stones: -First kidney stone was at age 9, he has underwent multiple surgeries and procedures over the years due to kidney stone -Right uretal stone s/p stent 11/24 -Was evaluated in the ER 11/24/23 for same issue -Currently on Oxybutynin  5 mg TID, Flomax  0.4 mg and prescribed Toviaz  but has not yet received. Taking Percocet PRN.  Has an appointment with urology in February. -Patient states since he was in the hospital in December he has had trouble sleeping and would like to discuss this. Has trouble both falling and staying asleep.  Health Maintenance: -Blood work UTD -Colon cancer screening due -Lung cancer screening due - quit smoking  8-10 years ago -Prostate cancer screening due  -Prevnar 20 due but patient declines at this time, will think about it  Outpatient Encounter Medications as of 11/27/2023  Medication Sig   albuterol  (VENTOLIN  HFA) 108 (90 Base) MCG/ACT inhaler Inhale 2 puffs into the lungs every 6 (six) hours as needed for wheezing or shortness of breath.   aspirin  EC 81 MG tablet Take 81 mg by mouth daily. Swallow whole.   atorvastatin  (LIPITOR) 40 MG tablet Take 1 tablet (40 mg total) by mouth daily.   metoprolol  succinate (TOPROL -XL) 25 MG 24 hr tablet Take 1 tablet (25 mg total) by mouth daily.   oxybutynin  (DITROPAN ) 5 MG tablet Take 1 tablet (5 mg total) by mouth 3 (three) times daily.   oxyCODONE -acetaminophen  (PERCOCET/ROXICET) 5-325 MG tablet Take 1 tablet by mouth every 8 (eight) hours as needed for severe pain (pain score 7-10).   polyethylene glycol (MIRALAX  / GLYCOLAX ) 17 g packet Take 17 g by mouth daily.   tamsulosin  (FLOMAX ) 0.4 MG CAPS capsule Take 1 capsule (0.4 mg total) by mouth daily after supper.   traZODone  (DESYREL ) 50 MG tablet Take 0.5-1 tablets (25-50 mg total) by mouth at bedtime as needed for sleep.   Vibegron  (GEMTESA ) 75 MG TABS Take 75 mg by mouth daily.   fesoterodine  (TOVIAZ ) 4 MG TB24 tablet Take 4 mg by mouth daily.   [Paused] furosemide  (LASIX ) 20 MG tablet Take 1 tablet (20 mg total) by mouth daily. (Patient not  taking: Reported on 11/13/2023)   [Paused] sacubitril -valsartan  (ENTRESTO ) 24-26 MG Take 1 tablet by mouth 2 (two) times daily. (Patient not taking: Reported on 11/13/2023)   [Paused] spironolactone  (ALDACTONE ) 25 MG tablet Take 1 tablet (25 mg total) by mouth daily. (Patient not taking: Reported on 11/13/2023)   No facility-administered encounter medications on file as of 11/27/2023.    Past Medical History:  Diagnosis Date   Coronary artery disease 05/01/2023   PCI/DES   HFrEF (heart failure with reduced ejection fraction) (HCC) 05/01/2023   Hiatal hernia    HLD  (hyperlipidemia)    HTN (hypertension)    Ischemic cardiomyopathy 05/01/2023   LVEF 30-35%   Kidney stones     Past Surgical History:  Procedure Laterality Date   CORONARY STENT INTERVENTION N/A 05/01/2023   Procedure: CORONARY STENT INTERVENTION;  Surgeon: Darron Deatrice LABOR, MD;  Location: [facility];  Service: Cardiovascular;  Laterality: N/A;   CYSTOSCOPY W/ URETERAL STENT PLACEMENT Right 10/12/2023   Procedure: CYSTOSCOPY WITH STENT REPLACEMENT;  Surgeon: Twylla Glendia BROCKS, MD;  Location: [facility] ORS;  Service: Urology;  Laterality: Right;   LEFT HEART CATH AND CORONARY ANGIOGRAPHY N/A 05/01/2023   Procedure: LEFT HEART CATH AND CORONARY ANGIOGRAPHY;  Surgeon: Darron Deatrice LABOR, MD;  Location: [facility];  Service: Cardiovascular;  Laterality: N/A;    Family History  Problem Relation Age of Onset   Heart disease Father     Social History   Socioeconomic History   Marital status: Divorced    Spouse name: Not on file   Number of children: Not on file   Years of education: Not on file   Highest education level: 10th grade  Occupational History   Not on file  Tobacco Use   Smoking status: Never    Passive exposure: Never   Smokeless tobacco: Never  Vaping Use   Vaping status: Never Used  Substance and Sexual Activity   Alcohol use: Never   Drug use: Yes    Types: Marijuana   Sexual activity: Not on file  Other Topics Concern   Not on file  Social History Narrative   Not on file   Social Drivers of Health   Financial Resource Strain: Low Risk  (10/12/2023)   Overall Financial Resource Strain (CARDIA)    Difficulty of Paying Living Expenses: Not hard at all  Food Insecurity: No Food Insecurity (11/05/2023)   Hunger Vital Sign    Worried About Running Out of Food in the Last Year: Never true    Ran Out of Food in the Last Year: Never true  Transportation Needs: No Transportation Needs (11/05/2023)   PRAPARE - Scientist, Research (physical Sciences) (Medical): No    Lack of Transportation (Non-Medical): No  Physical Activity: Not on file  Stress: Not on file  Social Connections: Not on file  Intimate Partner Violence: Not At Risk (11/05/2023)   Humiliation, Afraid, Rape, and Kick questionnaire    Fear of Current or Ex-Partner: No    Emotionally Abused: No    Physically Abused: No    Sexually Abused: No    Review of Systems  Constitutional:  Negative for chills and fever.  Genitourinary:  Positive for flank pain. Negative for dysuria, frequency, hematuria and urgency.  Psychiatric/Behavioral:  The patient has insomnia.         Objective    BP 128/82 (Cuff Size: Large)   Pulse 94   Temp 97.9 F (36.6 C) (Oral)  Resp 16   Ht 5' 9 (1.753 m)   Wt 210 lb 14.4 oz (95.7 kg)   BMI 31.14 kg/m   Physical Exam Constitutional:      Appearance: Normal appearance.  HENT:     Head: Normocephalic and atraumatic.     Mouth/Throat:     Mouth: Mucous membranes are moist.     Pharynx: Oropharynx is clear.  Eyes:     Extraocular Movements: Extraocular movements intact.     Conjunctiva/sclera: Conjunctivae normal.     Pupils: Pupils are equal, round, and reactive to light.  Neck:     Comments: No thyromegaly  Cardiovascular:     Rate and Rhythm: Normal rate and regular rhythm.  Pulmonary:     Effort: Pulmonary effort is normal.     Breath sounds: Normal breath sounds.  Musculoskeletal:     Cervical back: No tenderness.  Lymphadenopathy:     Cervical: No cervical adenopathy.  Skin:    General: Skin is warm and dry.  Neurological:     General: No focal deficit present.     Mental Status: He is alert. Mental status is at baseline.  Psychiatric:        Mood and Affect: Mood normal.        Behavior: Behavior normal.         Assessment & Plan:  Essential hypertension Assessment & Plan: Blood pressure controlled however he is only on Metoprolol  25 mg due to hypotension while admitted.    Chronic  systolic (congestive) heart failure [facility]) Assessment & Plan: Reviewed echo from November. Has some rales in the bilateral bases, restart Lasix  10 mg daily and continue to hold others until discussed with Cardiology.    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 heart with stable angina pectoris [facility]) Assessment & Plan: S/p PCI to proximal LAD and RCA 6/24, following with Cardiology and on statin and aspirin .    History of non-ST elevation myocardial infarction (NSTEMI) Assessment & Plan: Stent placed, following with Cardiology, on a statin and aspirin .    Prediabetes Assessment & Plan: Plan to recheck A1c next time.    Stage 3a chronic kidney disease (HCC) Assessment & Plan: Reviewed labs from last week, continue to monitor.    Recurrent nephrolithiasis Assessment & Plan: Reviewed ER note and he is following with Urology, has an appointment 2/6. Has not gotten his Toviaz  yet but has Flomax  and Oxybutynin  in the meantime.   Psychophysiological insomnia Assessment & Plan: Start Trazodone  25-50 mg Prn bedtime.   Orders: -     traZODone  HCl; Take 0.5-1 tablets (25-50 mg total) by mouth at bedtime as needed for sleep.  Dispense: 90 tablet; Refill: 0  Colon cancer screening -     Cologuard  Screening for lung cancer -     Ambulatory Referral for Lung Cancer Scre    Return in about 2 months (around 01/25/2024).   Sharyle Fischer, DO

## 2023-11-27 NOTE — Assessment & Plan Note (Signed)
 Stent placed, following with Cardiology, on a statin and aspirin.

## 2023-11-27 NOTE — Assessment & Plan Note (Signed)
 Reviewed ER note and he is following with Urology, has an appointment 2/6. Has not gotten his Gala Murdoch yet but has Flomax and Oxybutynin in the meantime.

## 2023-11-27 NOTE — Assessment & Plan Note (Signed)
 Reviewed labs from last week, continue to monitor.

## 2023-11-27 NOTE — Progress Notes (Signed)
 Advanced Heart Failure Clinic Note  PCP: Pcp, No  PCP-Cardiologist: Timothy Gollan, MD  HF-Cardiologist: Dr. Ria Commander  HPI:  [name] is a 68 y/o male with a history of NSTEMI, s/p PCI to proximal LAD and RCA on 05/01/23, HFrEF(EF 30 to 35%), previous tobacco/ alcohol use, kidney stones since the age of 23 and bilateral nephrolithiasis.   Admitted 04/28/23 due to new onset of substernal vice like substernal chest pain that radiated to his left arm and shortness of breath. Developed flash pulmonary edema during left heart cath 06/10 and was treated with nitroglycerin  drip. Status post successful balloon angioplasty to the proximal LAD and drug-eluting stent placement to the proximal right coronary artery.   Echo 04/30/23 showed LVEF 30-35% along with mild LVH and Grade II DD and moderate MR. Seen by Advanced HF team during admission on 05/03/23.  Visited ED 05/13/23 due to bilateral flank and suprapubic pain. CT renal was stable.   Admitted 05/16/23 due to shortness of breath, bilateral flank pain radiating towards lower abdomen, more on right than left, dysuria and gross hematuria. Patient deferred urology stent placement. Able to void after foley removed. Brilinta  was switched to Plavix , aspirin  discontinued in the setting of hematuria by cardiologist     Seen in CHF clinic on 06/09/23 where losartan  25 mg daily was added, however losartan  was stopped shortly after due to complaints of chest pain/palpitations.   CHF clinic on 06/30/23 where spironolactone  12.5 mg daily was added and metoprolol  was increased to 25 mg daily.   Seen in CHF pharmacy clinic on 07/24/23 where patient reported accidentally increasing metoprolol  to 37.5 mg daily and began to feel poorly. The dose was reduced back to 25 mg daily as intended and Entresto  24-26 mg bid was started.   Seen by CHF pharmacy 08/30/23 where spironolactone  was started.  Admitted 10/10/23 with bronchitis and kidney stone requiring stent.  Seen by [facility] 10/23/23 where the patient was confused about taking losartan  vs Entresto . Entresto  was resumed and losartan  was stopped.  Admitted 11/04/23 for kidney pain. Furosemide , Entresto , and spironolactone  were held for low BP.  Seen by [facility] on 11/13/23 where furosemide  20 mg daily was resumed.  Seen by PCP on 11/26/22 where weight was up ~10 lbs. Furosemide  was resumed at 10 mg daily.  Today Ronnie Mann returns to Heart Failure Clinic for pharmacist medication titration. Reports feeling worse over the last week. Reports fatigue, worsening shortness of breath over the last week, LEE over the last 2-3 days, and orthopnea over the last week. Reports occassional dizziness, though BP medications have been stopped and  BP is normal-high. Reports severe kidney pain and nausea after running out of pain medications. Denies chest pain, orthostasis, PND. Reports being able to complete all activities of daily living (ADLs) with frequent breaks. Is minimally active throughout the day. Weight at home is ~207 lbs this AM (dry weight ~201 lbs). Restarted furosemide  10 mg daily 2 days ago. Appetite is poor. Does follow a low sodium diet.   Current HF Medications: Beta Blocker: Metoprolol  succinate 25 mg daily ACEI/ARB/ARNI: none Mineralocorticoid Receptor Antagonist (MRA): none Loop diuretic: furosemide  10 mg daily  Has the patient been experiencing any side effects to the medications prescribed? No current ADEs. Reports feeling hyper with racing heart when he took losartan . Denies any swelling, rash, or other allergic symptoms. Experienced lightheadedness when accidentally taking too much of his metoprolol .  Does the patient have any problems obtaining medications due to  transportation or finances? No  Understanding of regimen: Fair  Understanding of indications: Fair  Potential of adherence: Fair.   Patient understands to avoid NSAIDs.  Patient understands to avoid decongestants.  Noted allergy symptoms today and recommended Zyrtec 10 mg daily.  Pertinent Lab Values: Creatinine, Ser  Date Value Ref Range Status  11/06/2023 1.48 (H) 0.61 - 1.24 mg/dL Final   BUN  Date Value Ref Range Status  11/06/2023 42 (H) 8 - 23 mg/dL Final  87/97/7975 26 8 - 27 mg/dL Final   Potassium  Date Value Ref Range Status  11/06/2023 4.8 3.5 - 5.1 mmol/L Final   Sodium  Date Value Ref Range Status  11/06/2023 137 135 - 145 mmol/L Final  10/23/2023 139 134 - 144 mmol/L Final   B Natriuretic Peptide  Date Value Ref Range Status  11/04/2023 198.6 (H) 0.0 - 100.0 pg/mL Final    Comment:    Performed at [facility], [address] [place], [place] [address]   Magnesium  Date Value Ref Range Status  10/11/2023 2.0 1.7 - 2.4 mg/dL Final    Comment:    Performed at [facility], [address] [place], [place] [address]   TSH  Date Value Ref Range Status  04/28/2023 4.483 0.350 - 4.500 uIU/mL Final    Comment:    Performed by a 3rd Generation assay with a functional sensitivity of <=0.01 uIU/mL. Performed at [facility], [address] [place], [place] [address]     Vital Signs: Today's Vitals   11/29/23 1405  BP: 138/66  Pulse: 78  SpO2: 98%  Weight: 207 lb 12.8 oz (94.3 kg)    Assessment/Plan: 1: Heart failure with reduced ejection fraction due to ischemic cardiomyopathy. - NSTEMI with PCI 04/2023 - NYHA class III. Able to complete all ADLs, but is having shortness of breath with minimal exertion. Symptoms have worsened over the last week. - Hypervolemic. REDS 38%, up from last REDS of 26%. Weight today is up significantly from dry weight of ~201-203 lbs. Increase furosemide  to previous dose of 20 mg daily. BMET on 11/27/23 with creatinine of 1.4 and potassium of 4.4. Repeat BMET on 12/07/23. -Encouraged to call for a daily weight gain of > 2 pounds or a weekly weight gain of > 5 pounds or symptoms of orthostasis. - Echo 04/30/23: EF  30-35% along with mild LVH and Grade II DD and moderate MR.  - Continue metoprolol  25 mg daily  - Continue holding spironolactone  25 mg daily at this time.  - Restart Entresto  24-26 mg BID. Tolerated well in the past. BP is up today. This will also help with diuersis. - Due to chronic kidney stones etc, may not be a good candidate for SGLT2    2: CAD- - Continue atorvastatin  40mg  daily  - Continue ASA 81mg  daily - LHC 05/01/23:   Prox LAD to Mid LAD lesion is 99% stenosed.   Mid LAD lesion is 60% stenosed.   1st Diag lesion is 70% stenosed.   Prox RCA lesion is 95% stenosed.   Mid RCA lesion is 30% stenosed.   RPDA lesion is 60% stenosed.   Dist LAD lesion is 99% stenosed.   A drug-eluting stent was successfully placed using a STENT ONYX FRONTIER 4.0X15.   Balloon angioplasty was performed using a BALLN Snelling EUPHORA RX 2.75X20.   Post intervention, there is a 20% residual stenosis.   Post intervention, there is a 0% residual stenosis.   There is  severe left ventricular systolic dysfunction.   LV end diastolic pressure is severely elevated.   There is moderate (3+) mitral regurgitation.  1.  Severe two-vessel coronary artery disease with subtotal occlusion of the proximal LAD, diffuse moderate mid LAD disease and severe distal LAD disease.  In addition, there is 95% in the proximal right coronary artery with faint right to left collaterals to the LAD.  The left circumflex has mild nonobstructive disease and OM 3 is large and reaches the apex. 2.  Severely reduced LV systolic function with an EF of 25%.  Severely elevated left ventricular end-diastolic pressure at 34 mmHg. 3.  Successful balloon angioplasty to the proximal LAD and drug-eluting stent placement to the proximal right coronary artery.   3: Kidney disease- - Has bilateral nonobstructive staghorn renal stones. No history of renal artery stenosis. - Followed closely by urology. They are planning MR and potential procedure. Patient  instructed to reach out to them regarding his renal pain. - Reports doing well on Flomax . Has not been able to get fesoterodine  yet due to requiring prior authorization.    4: HTN- - BP above goal today. Has been off CHF medications for several weeks. - See above   Follow up: with pharmacy in 1 week for medication titration and labs.  Thank you for involving pharmacy in this patient's care.  Jaun Bash, PharmD, BCPS Phone - [phone] Clinical Pharmacist 11/29/2023 3:03 PM

## 2023-11-27 NOTE — Assessment & Plan Note (Signed)
 Start Trazodone 25-50 mg Prn bedtime.

## 2023-11-27 NOTE — Patient Instructions (Signed)
 It was great seeing you today!  Plan discussed at today's visit: -Restart Lasix  10 mg daily until you see HF clinic, continue to hold other medications -Call Urologist about other medications -Start new medication for sleep, Trazodone  25 mg 30 minutes before bedtime. Can increase to 50 mg if lower dose is not enough -Referral for lung cancer screening ordered -Cologuard ordered -Plan for labs at follow up -Consider pneumonia vaccine   Follow up in: 2 months  Take care and let us  know if you have any questions or concerns prior to your next visit.  Dr. Bernardo

## 2023-11-27 NOTE — Assessment & Plan Note (Signed)
 Blood pressure controlled however he is only on Metoprolol 25 mg due to hypotension while admitted.

## 2023-11-27 NOTE — Assessment & Plan Note (Signed)
 Reviewed echo from November. Has some rales in the bilateral bases, restart Lasix 10 mg daily and continue to hold others until discussed with Cardiology.

## 2023-11-27 NOTE — Assessment & Plan Note (Signed)
 Plan to recheck A1c next time.

## 2023-11-27 NOTE — Assessment & Plan Note (Signed)
 S/p PCI to proximal LAD and RCA 6/24, following with Cardiology and on statin and aspirin.

## 2023-11-29 ENCOUNTER — Ambulatory Visit: Payer: Medicare HMO | Attending: Cardiology | Admitting: Pharmacist

## 2023-11-29 ENCOUNTER — Other Ambulatory Visit: Payer: Self-pay

## 2023-11-29 VITALS — BP 138/66 | HR 78 | Wt 207.8 lb

## 2023-11-29 DIAGNOSIS — I5022 Chronic systolic (congestive) heart failure: Secondary | ICD-10-CM

## 2023-11-29 MED ORDER — FUROSEMIDE 20 MG PO TABS
20.0000 mg | ORAL_TABLET | Freq: Every day | ORAL | 0 refills | Status: DC
  Filled 2023-11-29: qty 30, 30d supply, fill #0

## 2023-11-29 MED ORDER — ENTRESTO 24-26 MG PO TABS
1.0000 | ORAL_TABLET | Freq: Two times a day (BID) | ORAL | 3 refills | Status: DC
  Filled 2023-11-29: qty 60, 30d supply, fill #0

## 2023-11-29 NOTE — Patient Instructions (Addendum)
 It was a pleasure seeing you today!  MEDICATIONS: -We are changing your medications today -Restart Entresto  24-26 mg two times daily tonight -Increase furosemide  to 20 mg daily. Take the first dose today. -Continue to hold spironolactone  for now  -Call if you have questions about your medications.  LABS: -We will call you if your labs need attention.  NEXT APPOINTMENT: Return to clinic on Thursday the 16th with pharmacy.  In general, to take care of your heart failure: -Limit your fluid intake to 2 Liters (half-gallon) per day.   -Limit your salt intake to ideally 2-3 grams (2000-3000 mg) per day. -Weigh yourself daily and record, and bring that weight diary to your next appointment.  (Weight gain of 2-3 pounds in 1 day typically means fluid weight.) -The medications for your heart are to help your heart and help you live longer.   -Please contact us  before stopping any of your heart medications.  Call the clinic at [phone] with questions or to reschedule future appointments.

## 2023-11-30 ENCOUNTER — Other Ambulatory Visit: Payer: Self-pay

## 2023-11-30 ENCOUNTER — Encounter: Payer: Self-pay | Admitting: Family

## 2023-11-30 ENCOUNTER — Telehealth: Payer: Self-pay | Admitting: Cardiology

## 2023-11-30 MED ORDER — FUROSEMIDE 20 MG PO TABS
20.0000 mg | ORAL_TABLET | Freq: Every day | ORAL | 11 refills | Status: DC
  Filled 2023-11-30: qty 30, 30d supply, fill #0

## 2023-11-30 NOTE — Telephone Encounter (Signed)
 Refill sent to pharmacy.

## 2023-12-01 ENCOUNTER — Encounter: Payer: Self-pay | Admitting: Internal Medicine

## 2023-12-02 ENCOUNTER — Emergency Department: Payer: Medicare HMO

## 2023-12-02 ENCOUNTER — Other Ambulatory Visit: Payer: Self-pay

## 2023-12-02 DIAGNOSIS — I509 Heart failure, unspecified: Secondary | ICD-10-CM | POA: Diagnosis not present

## 2023-12-02 DIAGNOSIS — Z20822 Contact with and (suspected) exposure to covid-19: Secondary | ICD-10-CM | POA: Diagnosis not present

## 2023-12-02 DIAGNOSIS — I110 Hypertensive heart disease with heart failure: Secondary | ICD-10-CM | POA: Diagnosis not present

## 2023-12-02 DIAGNOSIS — Z79899 Other long term (current) drug therapy: Secondary | ICD-10-CM | POA: Insufficient documentation

## 2023-12-02 DIAGNOSIS — I2510 Atherosclerotic heart disease of native coronary artery without angina pectoris: Secondary | ICD-10-CM | POA: Diagnosis not present

## 2023-12-02 DIAGNOSIS — Z955 Presence of coronary angioplasty implant and graft: Secondary | ICD-10-CM | POA: Insufficient documentation

## 2023-12-02 DIAGNOSIS — R110 Nausea: Secondary | ICD-10-CM | POA: Diagnosis not present

## 2023-12-02 DIAGNOSIS — R079 Chest pain, unspecified: Secondary | ICD-10-CM | POA: Insufficient documentation

## 2023-12-02 DIAGNOSIS — M7989 Other specified soft tissue disorders: Secondary | ICD-10-CM | POA: Insufficient documentation

## 2023-12-02 DIAGNOSIS — R059 Cough, unspecified: Secondary | ICD-10-CM | POA: Diagnosis not present

## 2023-12-02 DIAGNOSIS — R109 Unspecified abdominal pain: Secondary | ICD-10-CM | POA: Insufficient documentation

## 2023-12-02 DIAGNOSIS — Z7982 Long term (current) use of aspirin: Secondary | ICD-10-CM | POA: Diagnosis not present

## 2023-12-02 DIAGNOSIS — R0602 Shortness of breath: Secondary | ICD-10-CM | POA: Diagnosis not present

## 2023-12-02 LAB — BASIC METABOLIC PANEL
Anion gap: 7 (ref 5–15)
BUN: 36 mg/dL — ABNORMAL HIGH (ref 8–23)
CO2: 23 mmol/L (ref 22–32)
Calcium: 8.8 mg/dL — ABNORMAL LOW (ref 8.9–10.3)
Chloride: 103 mmol/L (ref 98–111)
Creatinine, Ser: 1.43 mg/dL — ABNORMAL HIGH (ref 0.61–1.24)
GFR, Estimated: 54 mL/min — ABNORMAL LOW (ref >60)
Glucose, Bld: 131 mg/dL — ABNORMAL HIGH (ref 70–99)
Potassium: 4.3 mmol/L (ref 3.5–5.1)
Sodium: 133 mmol/L — ABNORMAL LOW (ref 135–145)

## 2023-12-02 LAB — CBC
HCT: 34 % — ABNORMAL LOW (ref 39.0–52.0)
Hemoglobin: 10.9 g/dL — ABNORMAL LOW (ref 13.0–17.0)
MCH: 29.8 pg (ref 26.0–34.0)
MCHC: 32.1 g/dL (ref 30.0–36.0)
MCV: 92.9 fL (ref 80.0–100.0)
Platelets: 200 10*3/uL (ref 150–400)
RBC: 3.66 MIL/uL — ABNORMAL LOW (ref 4.22–5.81)
RDW: 14.8 % (ref 11.5–15.5)
WBC: 5.4 10*3/uL (ref 4.0–10.5)
nRBC: 0 % (ref 0.0–0.2)

## 2023-12-02 MED ORDER — OXYCODONE-ACETAMINOPHEN 5-325 MG PO TABS
1.0000 | ORAL_TABLET | Freq: Once | ORAL | Status: AC
  Administered 2023-12-02: 1 via ORAL
  Filled 2023-12-02: qty 1

## 2023-12-02 NOTE — ED Triage Notes (Signed)
 Pt to ED via POV from home. Pt reports has kidney stones in both kidney that is suppose to be operated on in March but ran out of pain medication and cannot bear pain.

## 2023-12-03 ENCOUNTER — Emergency Department
Admission: EM | Admit: 2023-12-03 | Discharge: 2023-12-03 | Disposition: A | Discharge status: Home / Self Care | Payer: Medicare HMO | Attending: Emergency Medicine | Admitting: Emergency Medicine

## 2023-12-03 ENCOUNTER — Emergency Department: Payer: Medicare HMO

## 2023-12-03 ENCOUNTER — Other Ambulatory Visit: Payer: Self-pay

## 2023-12-03 DIAGNOSIS — R109 Unspecified abdominal pain: Secondary | ICD-10-CM

## 2023-12-03 DIAGNOSIS — R079 Chest pain, unspecified: Secondary | ICD-10-CM

## 2023-12-03 LAB — URINALYSIS, COMPLETE (UACMP) WITH MICROSCOPIC
Bacteria, UA: NONE SEEN
Bilirubin Urine: NEGATIVE
Glucose, UA: NEGATIVE mg/dL
Ketones, ur: NEGATIVE mg/dL
Nitrite: NEGATIVE
Protein, ur: 100 mg/dL — AB
RBC / HPF: >50 RBC/hpf (ref 0–5)
Specific Gravity, Urine: 1.013 (ref 1.005–1.030)
Squamous Epithelial / HPF: 0 /[HPF] (ref 0–5)
WBC, UA: >50 WBC/hpf (ref 0–5)
pH: 6 (ref 5.0–8.0)

## 2023-12-03 LAB — BRAIN NATRIURETIC PEPTIDE: B Natriuretic Peptide: 270.3 pg/mL — ABNORMAL HIGH (ref 0.0–100.0)

## 2023-12-03 LAB — TROPONIN I (HIGH SENSITIVITY)
Troponin I (High Sensitivity): 35 ng/L — ABNORMAL HIGH (ref <18)
Troponin I (High Sensitivity): 32 ng/L — ABNORMAL HIGH (ref <18)

## 2023-12-03 LAB — RESP PANEL BY RT-PCR (RSV, FLU A&B, COVID)  RVPGX2
Influenza A by PCR: NEGATIVE
Influenza B by PCR: NEGATIVE
Resp Syncytial Virus by PCR: NEGATIVE
SARS Coronavirus 2 by RT PCR: NEGATIVE

## 2023-12-03 MED ORDER — ONDANSETRON HCL 4 MG/2ML IJ SOLN
4.0000 mg | Freq: Once | INTRAMUSCULAR | Status: AC
  Administered 2023-12-03: 4 mg via INTRAVENOUS
  Filled 2023-12-03: qty 2

## 2023-12-03 MED ORDER — ONDANSETRON 4 MG PO TBDP: 4.0000 mg | ORAL_TABLET | Freq: Four times a day (QID) | ORAL | 0 refills | Status: DC | PRN

## 2023-12-03 MED ORDER — OXYCODONE-ACETAMINOPHEN 5-325 MG PO TABS: 2.0000 | ORAL_TABLET | Freq: Three times a day (TID) | ORAL | 0 refills | Status: DC | PRN

## 2023-12-03 MED ORDER — MORPHINE SULFATE (PF) 4 MG/ML IV SOLN
4.0000 mg | Freq: Once | INTRAVENOUS | Status: AC
  Administered 2023-12-03: 4 mg via INTRAVENOUS
  Filled 2023-12-03: qty 1

## 2023-12-03 MED ORDER — HYDROMORPHONE HCL 1 MG/ML IJ SOLN
1.0000 mg | Freq: Once | INTRAMUSCULAR | Status: AC
  Administered 2023-12-03: 1 mg via INTRAVENOUS
  Filled 2023-12-03: qty 1

## 2023-12-03 NOTE — ED Notes (Signed)
 Pt started to complain of left sided CP that is radiating down left arm and tingling to L arm. EKG done, Dr. Elesa Massed aware

## 2023-12-03 NOTE — Discharge Instructions (Signed)
 Please follow-up with your cardiologist and urologist as an outpatient.   You are being provided a prescription for opiates (also known as narcotics) for pain control.  Opiates can be addictive and should only be used when absolutely necessary for pain control when other alternatives do not work.  We recommend you only use them for the recommended amount of time and only as prescribed.  Please do not take with other sedative medications or alcohol.  Please do not drive, operate machinery, make important decisions while taking opiates.  Please note that these medications can be addictive and have high abuse potential.  Patients can become addicted to narcotics after only taking them for a few days.  Please keep these medications locked away from children, teenagers or any family members with history of substance abuse.  Narcotic pain medicine may also make you constipated.  You may use over-the-counter medications such as MiraLAX , Colace to prevent constipation.  If you become constipated, you may use over-the-counter enemas as needed.  Itching and nausea are also common side effects of narcotic pain medication.  If you develop uncontrolled vomiting or a rash, please stop these medications and seek medical care.

## 2023-12-03 NOTE — ED Provider Notes (Signed)
 [facility] Provider Note    Event Date/Time   First MD Initiated Contact with Patient 12/03/23 0032     (approximate)   History   Flank Pain   HPI  Ronnie Mann is a 68 y.o. male history of hypertension, hyperlipidemia, ischemic cardiomyopathy, CAD, kidney stones who presents to the emergency department with complaints of right flank pain, nausea.  No fevers, vomiting.  Reports he has had dysuria.  Denies testicular pain, scrotal swelling, redness or warmth.  Is followed by urology at [facility].  Called them today for pain medication and was told to come to the emergency department.  Patient has history of bilateral ureterolithiasis and has right ureteral stent in place.  He is scheduled to have a right PCNL at [facility] on 01/22/2024.  While here in the waiting room he states he has developed chest pain in the left side of his chest that radiates down his left arm.  Describes it as a toothache.  States he is feeling short of breath and has had cough.  Reports he did have some lower extremity swelling that has improved.  No calf tenderness.  No history of PE or DVT.   History provided by patient.    Past Medical History:  Diagnosis Date   Coronary artery disease 05/01/2023   PCI/DES   HFrEF (heart failure with reduced ejection fraction) (HCC) 05/01/2023   Hiatal hernia    HLD (hyperlipidemia)    HTN (hypertension)    Ischemic cardiomyopathy 05/01/2023   LVEF 30-35%   Kidney stones     Past Surgical History:  Procedure Laterality Date   CORONARY STENT INTERVENTION N/A 05/01/2023   Procedure: CORONARY STENT INTERVENTION;  Surgeon: Darron Deatrice LABOR, MD;  Location: [facility] INVASIVE CV LAB;  Service: Cardiovascular;  Laterality: N/A;   CYSTOSCOPY W/ URETERAL STENT PLACEMENT Right 10/12/2023   Procedure: CYSTOSCOPY WITH STENT REPLACEMENT;  Surgeon: Twylla Glendia BROCKS, MD;  Location: [facility] ORS;  Service: Urology;  Laterality: Right;   LEFT HEART CATH AND CORONARY  ANGIOGRAPHY N/A 05/01/2023   Procedure: LEFT HEART CATH AND CORONARY ANGIOGRAPHY;  Surgeon: Darron Deatrice LABOR, MD;  Location: [facility] INVASIVE CV LAB;  Service: Cardiovascular;  Laterality: N/A;    MEDICATIONS:  Prior to Admission medications   Medication Sig Start Date End Date Taking? Authorizing Provider  albuterol  (VENTOLIN  HFA) 108 (90 Base) MCG/ACT inhaler Inhale 2 puffs into the lungs every 6 (six) hours as needed for wheezing or shortness of breath. 10/14/23   Josette Ade, MD  aspirin  EC 81 MG tablet Take 81 mg by mouth daily. Swallow whole.    [provider]  atorvastatin  (LIPITOR) 40 MG tablet Take 1 tablet (40 mg total) by mouth daily. 10/14/23 11/27/23  Josette Ade, MD  fesoterodine  (TOVIAZ ) 4 MG TB24 tablet Take 4 mg by mouth daily. 11/24/23 03/23/24  [provider]  furosemide  (LASIX ) 20 MG tablet Take 1 tablet (20 mg total) by mouth daily. 11/30/23   Donette Ellouise LABOR, FNP  metoprolol  succinate (TOPROL -XL) 25 MG 24 hr tablet Take 1 tablet (25 mg total) by mouth daily. 06/30/23   Donette Ellouise LABOR, FNP  oxybutynin  (DITROPAN ) 5 MG tablet Take 1 tablet (5 mg total) by mouth 3 (three) times daily. Patient not taking: Reported on 11/29/2023 11/06/23 12/06/23  Awanda Ellouise, MD  oxyCODONE -acetaminophen  (PERCOCET/ROXICET) 5-325 MG tablet Take 1 tablet by mouth every 8 (eight) hours as needed for severe pain (pain score 7-10). 11/06/23   Awanda Ellouise, MD  polyethylene glycol (MIRALAX  / GLYCOLAX ) 17 g packet Take 17 g by mouth daily. 10/14/23   Josette Ade, MD  sacubitril -valsartan  (ENTRESTO ) 24-26 MG Take 1 tablet by mouth 2 (two) times daily. 11/29/23   Rolan Ezra RAMAN, MD  spironolactone  (ALDACTONE ) 25 MG tablet Take 1 tablet (25 mg total) by mouth daily. Patient not taking: Reported on 11/13/2023 08/30/23 12/18/23  Sabharwal, Ria, DO  tamsulosin  (FLOMAX ) 0.4 MG CAPS capsule Take 1 capsule (0.4 mg total) by mouth daily after supper. 11/06/23   Awanda City, MD  traZODone  (DESYREL )  50 MG tablet Take 0.5-1 tablets (25-50 mg total) by mouth at bedtime as needed for sleep. 11/27/23   Bernardo Fend, DO  Vibegron  (GEMTESA ) 75 MG TABS Take 75 mg by mouth daily. Patient not taking: Reported on 11/29/2023    [provider]    Physical Exam   Triage Vital Signs: ED Triage Vitals  Encounter Vitals Group     BP 12/02/23 1648 125/66     Systolic BP Percentile --      Diastolic BP Percentile --      Pulse Rate 12/02/23 1646 80     Resp 12/02/23 1646 20     Temp 12/02/23 1646 98.1 F (36.7 C)     Temp Source 12/02/23 1646 Oral     SpO2 12/02/23 1646 97 %     Weight --      Height --      Head Circumference --      Peak Flow --      Pain Score 12/02/23 1647 10     Pain Loc --      Pain Education --      Exclude from Growth Chart --     Most recent vital signs: Vitals:   12/02/23 2132 12/03/23 0214  BP: 127/78 103/65  Pulse: 73 70  Resp: 20 18  Temp: 98.3 F (36.8 C) 98.2 F (36.8 C)  SpO2: 99% 96%    CONSTITUTIONAL: Alert, responds appropriately to questions.  Chronically ill-appearing, in no distress HEAD: Normocephalic, atraumatic EYES: Conjunctivae clear, pupils appear equal, sclera nonicteric ENT: normal nose; moist mucous membranes NECK: Supple, normal ROM CARD: RRR; S1 and S2 appreciated RESP: Normal chest excursion without splinting or tachypnea; breath sounds clear and equal bilaterally; no wheezes, no rhonchi, no rales, no hypoxia or respiratory distress, speaking full sentences ABD/GI: Non-distended; soft, non-tender, no rebound, no guarding, no peritoneal signs BACK: The back appears normal EXT: Normal ROM in all joints; no deformity noted, no edema SKIN: Normal color for age and race; warm; no rash on exposed skin NEURO: Moves all extremities equally, normal speech PSYCH: The patient's mood and manner are appropriate.   ED Results / Procedures / Treatments   LABS: (all labs ordered are listed, but only abnormal results are  displayed) Labs Reviewed  CBC - Abnormal; Notable for the following components:      Result Value   RBC 3.66 (*)    Hemoglobin 10.9 (*)    HCT 34.0 (*)    All other components within normal limits  BASIC METABOLIC PANEL - Abnormal; Notable for the following components:   Sodium 133 (*)    Glucose, Bld 131 (*)    BUN 36 (*)    Creatinine, Ser 1.43 (*)    Calcium  8.8 (*)    GFR, Estimated 54 (*)    All other components within normal limits  URINALYSIS, COMPLETE (UACMP) WITH MICROSCOPIC - Abnormal; Notable for the following components:  Color, Urine AMBER (*)    APPearance CLOUDY (*)    Hgb urine dipstick LARGE (*)    Protein, ur 100 (*)    Leukocytes,Ua LARGE (*)    All other components within normal limits  BRAIN NATRIURETIC PEPTIDE - Abnormal; Notable for the following components:   B Natriuretic Peptide 270.3 (*)    All other components within normal limits  TROPONIN I (HIGH SENSITIVITY) - Abnormal; Notable for the following components:   Troponin I (High Sensitivity) 35 (*)    All other components within normal limits  TROPONIN I (HIGH SENSITIVITY) - Abnormal; Notable for the following components:   Troponin I (High Sensitivity) 32 (*)    All other components within normal limits  RESP PANEL BY RT-PCR (RSV, FLU A&B, COVID)  RVPGX2  URINE CULTURE     EKG:  EKG Interpretation Date/Time:  Sunday December 03 2023 00:24:04 EST Ventricular Rate:  69 PR Interval:  150 QRS Duration:  134 QT Interval:  434 QTC Calculation: 465 R Axis:   -67  Text Interpretation: Normal sinus rhythm with Premature supraventricular complexes Right bundle branch block Left anterior fascicular block Bifascicular block Septal infarct , age undetermined Abnormal ECG No significant change since last tracing Confirmed by Neomi Neptune [phone]) on 12/03/2023 1:18:58 AM         RADIOLOGY: My personal review and interpretation of imaging: CT scan shows bilateral kidney stones, stents in place in  both ureters stable compared to previous.  I have personally reviewed all radiology reports.   DG Chest Portable 1 View Result Date: 12/03/2023 CLINICAL DATA:  Chest pain and shortness of breath. EXAM: PORTABLE CHEST 1 VIEW COMPARISON:  CTA chest 11/04/2023. FINDINGS: The heart is enlarged.  Central vessels are normal caliber. The lungs are slightly emphysematous. There is linear scarring or atelectasis in the left base without evidence of acute infiltrates. Stable mediastinum with large hiatal hernia. Aortic atherosclerosis. No substantial pleural effusion. Osteopenia and thoracic spondylosis.  No new osseous findings. IMPRESSION: 1. No evidence of acute chest disease.  Stable COPD chest. 2. Cardiomegaly. 3. Large hiatal hernia. 4. Aortic atherosclerosis. Electronically Signed   By: Francis Quam M.D.   On: 12/03/2023 00:56   CT Renal Stone Study Result Date: 12/02/2023 CLINICAL DATA:  Abdominal/flank pain. Stone suspected. Underwent cystoscopy with right ureteral stent placement 10/12/2023 most recently. EXAM: CT ABDOMEN AND PELVIS WITHOUT CONTRAST TECHNIQUE: Multidetector CT imaging of the abdomen and pelvis was performed following the standard protocol without IV contrast. RADIATION DOSE REDUCTION: This exam was performed according to the departmental dose-optimization program which includes automated exposure control, adjustment of the mA and/or kV according to patient size and/or use of iterative reconstruction technique. COMPARISON:  CTs without contrast 11/04/2023 and 10/10/2023, CT with IV contrast 05/21/2023. FINDINGS: Lower chest: Lung bases again show atelectasis and scarring changes without infiltrates. There is mild cardiomegaly and a small chronic pericardial effusion anteriorly, coronary artery calcifications with prior right coronary artery stenting. There is a moderate-to-large sized hiatal hernia again seen with about half of the stomach intrathoracic. Hepatobiliary: Multiple stones in the  gallbladder, largest 3.9 cm. No wall thickening, inflammatory changes or bile duct dilatation. Unremarkable unenhanced liver. Pancreas: Atrophic but otherwise unremarkable. Spleen: Slightly prominent 14 cm length.  No focal abnormality. Adrenals/Urinary Tract: Stable 1.8 cm left adrenal adenoma, Hounsfield density is -14. Stable 1.6 cm right adrenal genu adenoma, Hounsfield density is -5. Diffuse left renal atrophy. Bilateral Bosniak 1 cysts, more numerous on the left. Largest on  the right is a parapelvic cyst in the upper pole measuring 3.5 cm, largest on the left is in the lower pole, 5.3 cm. 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 There are tiny nonobstructive caliceal stones in the right inferior pole with 5-8 mm nonobstructing stones left lower pole. Left ureteral stone at the level of L3-4 is again noted, 1.5 x 6 x 6 mm. Mild upstream left hydronephrosis appears similar and the position of the stone has not changed. On the right, urothelial thickening in the renal pelvis also is unchanged in appearance as well as parapelvic stranding. A double-J right ureteral stent is stable in positioning. Also stable in position as an 8 mm peristent right ureteral stone at the level of the pelvic inlet. Both ureters are otherwise clear. There is only slight right hydronephrosis, similar. The bladder wall and lumen are unremarkable. Stomach/Bowel: No dilatation or wall thickening including the appendix. Moderate retained stool ascending colon. Vascular/Lymphatic: Aortic atherosclerosis. No enlarged abdominal or pelvic lymph nodes. Reproductive: Mild prostatomegaly. Other: None. Musculoskeletal: Osteopenia and degenerative change. No concerning bone lesions. Mild chronic height loss in the lower thoracic vertebrae. IMPRESSION: 1. Stable positioning of the right ureteral stent and 8 mm right ureteral stone at the level of the pelvic inlet.  Slight right hydronephrosis is unchanged. 2. Stable mild left hydronephrosis with 1.5 x 6 x 6 mm left ureteral stone at the level of L3-4. 3. 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 4. Stable urothelial thickening in the right renal pelvis with parapelvic stranding. Possible infectious complication but unchanged in appearance. 5. Cholelithiasis. 6. Moderate-to-large sized hiatal hernia. 7. Aortic and coronary artery atherosclerosis. 8. Stable bilateral adrenal adenomas. 9. Prostatomegaly. 10. Osteopenia and degenerative change. Aortic Atherosclerosis (ICD10-I70.0). Electronically Signed   By: Francis Quam M.D.   On: 12/02/2023 23:16     PROCEDURES:  Critical Care performed: No    EKG Interpretation Date/Time:  Sunday December 03 2023 00:24:04 EST Ventricular Rate:  69 PR Interval:  150 QRS Duration:  134 QT Interval:  434 QTC Calculation: 465 R Axis:   -67  Text Interpretation: Normal sinus rhythm with Premature supraventricular complexes Right bundle branch block Left anterior fascicular block Bifascicular block Septal infarct , age undetermined Abnormal ECG No significant change since last tracing Confirmed by Neomi Neptune [phone]) on 12/03/2023 1:18:58 AM          Procedures    IMPRESSION / MDM / ASSESSMENT AND PLAN / ED COURSE  I reviewed the triage vital signs and the nursing notes.    Patient here with complaints of right flank pain.  History of bilateral ureterolithiasis status post stents.  Followed by [facility] urology.  Also complaining chest pain.  Has complex cardiac history.  Is on Plavix .    DIFFERENTIAL DIAGNOSIS (includes but not limited to):   Worsening hydronephrosis, new renal stone, UTI, uncontrolled chronic pain  ACS, CHF, PE, dissection, pneumonia, viral URI, chest wall pain   Patient's presentation is most consistent with acute presentation with potential threat to life or  bodily function.   PLAN: Workup initiated from triage.  Patient has no leukocytosis.  Stable chronic kidney disease.  Urine pending.  CT of the abdomen pelvis reviewed and interpreted by myself and the radiologist and shows stable findings unchanged from previous imaging on 1214/24 and 10/10/2023.  As for his chest pain, EKG showed no new ischemic change.  Will obtain 2 troponins, COVID and flu swab, chest x-ray, BNP.  Will give pain and nausea medicine here and allow him to eat and drink.   MEDICATIONS GIVEN IN ED: Medications  oxyCODONE -acetaminophen  (PERCOCET/ROXICET) 5-325 MG per tablet 1 tablet (1 tablet Oral Given 12/02/23 2213)  morphine  (PF) 4 MG/ML injection 4 mg (4 mg Intravenous Given 12/03/23 0106)  ondansetron  (ZOFRAN ) injection 4 mg (4 mg Intravenous Given 12/03/23 0106)  HYDROmorphone  (DILAUDID ) injection 1 mg (1 mg Intravenous Given 12/03/23 0216)     ED COURSE: Troponins minimally elevated but flat.  COVID, flu and RSV negative.  Chest x-ray reviewed and interpreted by myself and the radiologist and shows no edema, infiltrate.  Urine does show large amount of red blood cells and white blood cells consistent with previous urine samples.  No bacteria, nitrites.  This does not appear to be infected today.  Culture however is pending.  Pain has been well-controlled here and he is tolerating p.o.  Will discharge with pain and nausea medicine and have him follow-up with his outpatient urologist and cardiologist as an outpatient.   At this time, I do not feel there is any life-threatening condition present. I reviewed all nursing notes, vitals, pertinent previous records.  All lab and urine results, EKGs, imaging ordered have been independently reviewed and interpreted by myself.  I reviewed all available radiology reports from any imaging ordered this visit.  Based on my assessment, I feel the patient is safe to be discharged home without further emergent workup and can continue  workup as an outpatient as needed. Discussed all findings, treatment plan as well as usual and customary return precautions.  They verbalize understanding and are comfortable with this plan.  Outpatient follow-up has been provided as needed.  All questions have been answered.    CONSULTS:  none   OUTSIDE RECORDS REVIEWED: Reviewed previous urology notes at [facility].       FINAL CLINICAL IMPRESSION(S) / ED DIAGNOSES   Final diagnoses:  Right flank pain  Left-sided chest pain     Rx / DC Orders   ED Discharge Orders          Ordered    oxyCODONE -acetaminophen  (PERCOCET) 5-325 MG tablet  Every 8 hours PRN        12/03/23 0418    ondansetron  (ZOFRAN -ODT) 4 MG disintegrating tablet  Every 6 hours PRN        12/03/23 0418             Note:  This document was prepared using Dragon voice recognition software and may include unintentional dictation errors.   Laterica Matarazzo, Josette SAILOR, DO 12/03/23 [phone]

## 2023-12-04 ENCOUNTER — Telehealth: Payer: Self-pay

## 2023-12-04 LAB — URINE CULTURE: Culture: NO GROWTH

## 2023-12-04 NOTE — Telephone Encounter (Signed)
   Pre-operative Risk Assessment   HEARTCARE STAFF-IMPORTANT INSTRUCTIONS 1 Red and Blue Text will auto delete once note is signed or closed. 2 Press F2 to navigate through template.   3 On drop down lists, L click to select >> R click to activate next field 4 Reason for Visit format is IMPORTANT!!  See Directions on No. 2 below. 5 Please review chart to determine if there is already a clearance note open for this procedure!!  DO NOT duplicate if a note already exists!!    :1}      Request for Surgical Clearance    Procedure:   Percutaneous Nephrolithotomy  Date of Surgery:  Clearance 01/22/24                                 Surgeon:  Alm Sprinkle, MD Surgeon's Group or Practice Name:  [facility]  Phone number:  [phone] Fax number:  [phone] Please fax requested material to Attn: Urology Surgery Scheduler   Type of Clearance Requested:   Medical   5. What type of anesthesia will be used?  Press F2 and select the anesthesia to be used for the procedure.  :1}  Type of Anesthesia:  Not Indicated   Additional requests/questions:  Please fax a copy of echo results and stress test results to the surgeon's office.  Signed, Dorothyann Mourer K Celie Desrochers   12/04/2023, 10:28 AM   [facility] Ellouise Class, FNP  [facility] [address] [facility] and Vascular [place] [address] [phone] (office) [phone] (fax)

## 2023-12-04 NOTE — Telephone Encounter (Signed)
 Cardiac Clearance, previous cath results, and most recent echo results faxed to [facility] per request. Fax sent successfully.

## 2023-12-04 NOTE — Telephone Encounter (Signed)
 Cardiac Clearance provided and is moderate risk due to previous cardiac issues with stent 06/24. Plavix was stopped 07/24 due to hematuria. He is only taking 81mg  ASA so no need to stop this prior to procedure.

## 2023-12-04 NOTE — Progress Notes (Signed)
  Advanced [facility] Failure Clinic Note  PCP: Pcp, No  PCP-Cardiologist: Julien Nordmann, MD  HF-Cardiologist: Dr. Dorthula Nettles  HPI:  Ronnie Mann is a 68 y/o male with a history of NSTEMI, s/p PCI to proximal LAD and RCA on 05/01/23, HFrEF(EF 30 to 35%), previous tobacco/ alcohol use, kidney stones since the age of 11 and bilateral nephrolithiasis.   Admitted 04/28/23 due to new onset of substernal "vice like" substernal chest pain that radiated to his left arm and shortness of breath. Developed flash pulmonary edema during left heart cath 06/10 and was treated with nitroglycerin drip. Status post successful balloon angioplasty to the proximal LAD and drug-eluting stent placement to the proximal right coronary artery.   Echo 04/30/23 showed LVEF 30-35% along with mild LVH and Grade II DD and moderate Ronnie. Seen by Advanced HF team during admission on 05/03/23.  Visited ED 05/13/23 due to bilateral flank and suprapubic pain. CT renal was stable.   Admitted 05/16/23 due to shortness of breath, bilateral flank pain radiating towards lower abdomen, more on right than left, dysuria and gross hematuria. Patient deferred urology stent placement. Able to void after foley removed. Brilinta was switched to Plavix, aspirin discontinued in the setting of hematuria by cardiologist     Seen in CHF clinic on 06/09/23 where losartan 25 mg daily was added, however losartan was stopped shortly after due to complaints of chest pain/palpitations.   CHF clinic on 06/30/23 where spironolactone 12.5 mg daily was added and metoprolol was increased to 25 mg daily.   Seen in CHF pharmacy clinic on 07/24/23 where patient reported accidentally increasing metoprolol to 37.5 mg daily and began to feel poorly. The dose was reduced back to 25 mg daily as intended and Entresto 24-26 mg bid was started.   Seen by CHF pharmacy 08/30/23 where spironolactone was started.  Admitted 10/10/23 with bronchitis and kidney stone requiring stent.  Seen by Clarisa Kindred 10/23/23 where the patient was confused about taking losartan vs Entresto. Sherryll Burger was resumed and losartan was stopped.  Admitted 11/04/23 for kidney pain. Furosemide, Entresto, and spironolactone were held for low BP.  Seen by Clarisa Kindred on 11/13/23 where furosemide 20 mg daily was resumed.  Seen by PCP on 11/26/22 where weight was up ~10 lbs. Furosemide was resumed at 10 mg daily.  Seen by CHF pharmacy on 11/29/23 where patient was hypervolemic with DOE and orthopnea. Due to hypertension and hypervolemia Entresto was restarted and furosemide was increased to 20 mg daily after a boosted dose.  Presented to the ED 12/03/23 due to renal pain. Discharged with pain medications and ondansetron.   Today Ronnie Mann returns to [facility] Failure [facility] for pharmacist medication titration. Ronnie Mann presented in a wheelchair, leaning forward in kidney pain per patient report. Also with diaphoresis, nausea, and arm pain. Patient reports running out of furosemide 2-3 days ago and weight was up 208 lbs at home. After discussion with Clarisa Kindred, the decision was made to send Ronnie Mann to the emergency department.  Assessment/Plan:  1: Wheeled patient to the ED and passed off to ED staff.  Enos Fling, PharmD, BCPS Phone - [phone] Clinical Pharmacist 12/04/2023 1:26 PM

## 2023-12-07 ENCOUNTER — Other Ambulatory Visit: Payer: Self-pay

## 2023-12-07 ENCOUNTER — Emergency Department: Payer: Medicare HMO

## 2023-12-07 ENCOUNTER — Encounter: Payer: Self-pay | Admitting: Emergency Medicine

## 2023-12-07 ENCOUNTER — Emergency Department
Admission: EM | Admit: 2023-12-07 | Discharge: 2023-12-07 | Disposition: A | Discharge status: Home / Self Care | Payer: Medicare HMO | Attending: Emergency Medicine | Admitting: Emergency Medicine

## 2023-12-07 ENCOUNTER — Other Ambulatory Visit
Admission: RE | Admit: 2023-12-07 | Discharge: 2023-12-07 | Disposition: A | Discharge status: Home / Self Care | Payer: Medicare HMO | Source: Ambulatory Visit | Attending: Cardiology | Admitting: Cardiology

## 2023-12-07 ENCOUNTER — Ambulatory Visit: Payer: Medicare HMO | Admitting: Pharmacist

## 2023-12-07 DIAGNOSIS — N132 Hydronephrosis with renal and ureteral calculous obstruction: Secondary | ICD-10-CM | POA: Diagnosis not present

## 2023-12-07 DIAGNOSIS — I2510 Atherosclerotic heart disease of native coronary artery without angina pectoris: Secondary | ICD-10-CM | POA: Diagnosis not present

## 2023-12-07 DIAGNOSIS — N23 Unspecified renal colic: Secondary | ICD-10-CM | POA: Diagnosis not present

## 2023-12-07 DIAGNOSIS — N3090 Cystitis, unspecified without hematuria: Secondary | ICD-10-CM | POA: Insufficient documentation

## 2023-12-07 DIAGNOSIS — I130 Hypertensive heart and chronic kidney disease with heart failure and stage 1 through stage 4 chronic kidney disease, or unspecified chronic kidney disease: Secondary | ICD-10-CM | POA: Insufficient documentation

## 2023-12-07 DIAGNOSIS — R110 Nausea: Secondary | ICD-10-CM | POA: Insufficient documentation

## 2023-12-07 DIAGNOSIS — N200 Calculus of kidney: Secondary | ICD-10-CM

## 2023-12-07 DIAGNOSIS — N189 Chronic kidney disease, unspecified: Secondary | ICD-10-CM | POA: Diagnosis not present

## 2023-12-07 DIAGNOSIS — I252 Old myocardial infarction: Secondary | ICD-10-CM | POA: Insufficient documentation

## 2023-12-07 DIAGNOSIS — Z87442 Personal history of urinary calculi: Secondary | ICD-10-CM | POA: Insufficient documentation

## 2023-12-07 DIAGNOSIS — M79603 Pain in arm, unspecified: Secondary | ICD-10-CM | POA: Insufficient documentation

## 2023-12-07 DIAGNOSIS — I509 Heart failure, unspecified: Secondary | ICD-10-CM | POA: Insufficient documentation

## 2023-12-07 DIAGNOSIS — R109 Unspecified abdominal pain: Secondary | ICD-10-CM | POA: Diagnosis present

## 2023-12-07 DIAGNOSIS — Z87891 Personal history of nicotine dependence: Secondary | ICD-10-CM | POA: Insufficient documentation

## 2023-12-07 DIAGNOSIS — I5020 Unspecified systolic (congestive) heart failure: Secondary | ICD-10-CM | POA: Insufficient documentation

## 2023-12-07 DIAGNOSIS — Z955 Presence of coronary angioplasty implant and graft: Secondary | ICD-10-CM | POA: Diagnosis not present

## 2023-12-07 LAB — BASIC METABOLIC PANEL
Anion gap: 10 (ref 5–15)
Anion gap: 6 (ref 5–15)
BUN: 32 mg/dL — ABNORMAL HIGH (ref 8–23)
BUN: 32 mg/dL — ABNORMAL HIGH (ref 8–23)
CO2: 19 mmol/L — ABNORMAL LOW (ref 22–32)
CO2: 23 mmol/L (ref 22–32)
Calcium: 9.2 mg/dL (ref 8.9–10.3)
Calcium: 9.1 mg/dL (ref 8.9–10.3)
Chloride: 108 mmol/L (ref 98–111)
Chloride: 108 mmol/L (ref 98–111)
Creatinine, Ser: 1.41 mg/dL — ABNORMAL HIGH (ref 0.61–1.24)
Creatinine, Ser: 1.33 mg/dL — ABNORMAL HIGH (ref 0.61–1.24)
GFR, Estimated: 55 mL/min — ABNORMAL LOW (ref >60)
GFR, Estimated: 59 mL/min — ABNORMAL LOW (ref >60)
Glucose, Bld: 148 mg/dL — ABNORMAL HIGH (ref 70–99)
Glucose, Bld: 122 mg/dL — ABNORMAL HIGH (ref 70–99)
Potassium: 5 mmol/L (ref 3.5–5.1)
Potassium: 4.8 mmol/L (ref 3.5–5.1)
Sodium: 137 mmol/L (ref 135–145)
Sodium: 137 mmol/L (ref 135–145)

## 2023-12-07 LAB — URINALYSIS, ROUTINE W REFLEX MICROSCOPIC
Bacteria, UA: NONE SEEN
Bilirubin Urine: NEGATIVE
Glucose, UA: NEGATIVE mg/dL
Ketones, ur: NEGATIVE mg/dL
Nitrite: NEGATIVE
Protein, ur: 100 mg/dL — AB
RBC / HPF: >50 RBC/hpf (ref 0–5)
Specific Gravity, Urine: 1.015 (ref 1.005–1.030)
Squamous Epithelial / HPF: 0 /[HPF] (ref 0–5)
pH: 5 (ref 5.0–8.0)

## 2023-12-07 LAB — CBC WITH DIFFERENTIAL/PLATELET
Abs Immature Granulocytes: 0.02 10*3/uL (ref 0.00–0.07)
Basophils Absolute: 0 10*3/uL (ref 0.0–0.1)
Basophils Relative: 1 %
Eosinophils Absolute: 0.1 10*3/uL (ref 0.0–0.5)
Eosinophils Relative: 2 %
HCT: 32.3 % — ABNORMAL LOW (ref 39.0–52.0)
Hemoglobin: 10.6 g/dL — ABNORMAL LOW (ref 13.0–17.0)
Immature Granulocytes: 0 %
Lymphocytes Relative: 16 %
Lymphs Abs: 0.9 10*3/uL (ref 0.7–4.0)
MCH: 29.6 pg (ref 26.0–34.0)
MCHC: 32.8 g/dL (ref 30.0–36.0)
MCV: 90.2 fL (ref 80.0–100.0)
Monocytes Absolute: 0.5 10*3/uL (ref 0.1–1.0)
Monocytes Relative: 9 %
Neutro Abs: 4.1 10*3/uL (ref 1.7–7.7)
Neutrophils Relative %: 72 %
Platelets: 198 10*3/uL (ref 150–400)
RBC: 3.58 MIL/uL — ABNORMAL LOW (ref 4.22–5.81)
RDW: 14.6 % (ref 11.5–15.5)
WBC: 5.7 10*3/uL (ref 4.0–10.5)
nRBC: 0 % (ref 0.0–0.2)

## 2023-12-07 LAB — BRAIN NATRIURETIC PEPTIDE: B Natriuretic Peptide: 1051.7 pg/mL — ABNORMAL HIGH (ref 0.0–100.0)

## 2023-12-07 MED ORDER — CEPHALEXIN 500 MG PO CAPS: 500.0000 mg | ORAL_CAPSULE | Freq: Three times a day (TID) | ORAL | 0 refills | Status: AC

## 2023-12-07 MED ORDER — CEPHALEXIN 500 MG PO CAPS
500.0000 mg | ORAL_CAPSULE | Freq: Once | ORAL | Status: AC
  Administered 2023-12-07: 500 mg via ORAL
  Filled 2023-12-07: qty 1

## 2023-12-07 MED ORDER — HYDROMORPHONE HCL 1 MG/ML IJ SOLN
1.0000 mg | Freq: Once | INTRAMUSCULAR | Status: AC
  Administered 2023-12-07: 1 mg via INTRAMUSCULAR
  Filled 2023-12-07: qty 1

## 2023-12-07 MED ORDER — ONDANSETRON 4 MG PO TBDP
4.0000 mg | ORAL_TABLET | Freq: Once | ORAL | Status: AC
  Administered 2023-12-07: 4 mg via ORAL
  Filled 2023-12-07: qty 1

## 2023-12-07 NOTE — ED Provider Notes (Signed)
[facility] Provider Note    Event Date/Time   First MD Initiated Contact with Patient 12/07/23 1805     (approximate)   History   Flank Pain   HPI  Ronnie Mann is a 68 y.o. male with history of CHF, AKI, hypertension, CAD, CKD and as listed in EMR presents to the emergency department for treatment and evaluation of right flank pain.  Patient has a known kidney stone and medications prescribed have not helped.  He is also felt nauseated due to the pain but has not vomited.Marland Kitchen      Physical Exam   Triage Vital Signs: ED Triage Vitals  Encounter Vitals Group     BP 12/07/23 1527 119/68     Systolic BP Percentile --      Diastolic BP Percentile --      Pulse Rate 12/07/23 1527 68     Resp 12/07/23 1527 18     Temp 12/07/23 1527 98 F (36.7 C)     Temp Source 12/07/23 1527 Oral     SpO2 12/07/23 1527 98 %     Weight 12/07/23 1525 201 lb (91.2 kg)     Height 12/07/23 1525 5\' 9"  (1.753 m)     Head Circumference --      Peak Flow --      Pain Score 12/07/23 1525 10     Pain Loc --      Pain Education --      Exclude from Growth Chart --     Most recent vital signs: Vitals:   12/07/23 1527 12/07/23 1847  BP: 119/68 102/89  Pulse: 68 71  Resp: 18 17  Temp: 98 F (36.7 C) 97.8 F (36.6 C)  SpO2: 98% 95%    General: Awake, no distress.  CV:  Good peripheral perfusion.  Resp:  Normal effort.  Abd:  No distention.  Other:  Right-sided CVA tenderness   ED Results / Procedures / Treatments   Labs (all labs ordered are listed, but only abnormal results are displayed) Labs Reviewed  BASIC METABOLIC PANEL - Abnormal; Notable for the following components:      Result Value   Glucose, Bld 122 (*)    BUN 32 (*)    Creatinine, Ser 1.33 (*)    GFR, Estimated 59 (*)    All other components within normal limits  CBC WITH DIFFERENTIAL/PLATELET - Abnormal; Notable for the following components:   RBC 3.58 (*)    Hemoglobin 10.6 (*)    HCT  32.3 (*)    All other components within normal limits  URINALYSIS, ROUTINE W REFLEX MICROSCOPIC - Abnormal; Notable for the following components:   Color, Urine AMBER (*)    APPearance CLOUDY (*)    Hgb urine dipstick LARGE (*)    Protein, ur 100 (*)    Leukocytes,Ua MODERATE (*)    All other components within normal limits     EKG  Not indicated   RADIOLOGY  Image and radiology report reviewed and interpreted by me. Radiology report consistent with the same.  CT image of the abdomen and pelvis shows a right nephroureteral stent in good position.  No significant changes from the previous image on 12/02/2023  PROCEDURES:  Critical Care performed: No  Procedures   MEDICATIONS ORDERED IN ED:  Medications  ondansetron (ZOFRAN-ODT) disintegrating tablet 4 mg (4 mg Oral Given 12/07/23 1842)  HYDROmorphone (DILAUDID) injection 1 mg (1 mg Intramuscular Given 12/07/23 1843)  cephALEXin (KEFLEX)  capsule 500 mg (500 mg Oral Given 12/07/23 1842)     IMPRESSION / MDM / ASSESSMENT AND PLAN / ED COURSE   I have reviewed the triage note.  Differential diagnosis includes, but is not limited to, kidney stone, infected stone, hydronephrosis, AKI  Patient's presentation is most consistent with acute presentation with potential threat to life or bodily function.  68 year old male presenting to the emergency department for treatment and evaluation of right side flank pain.  See HPI for further details.  While awaiting ER room assignment, labs were drawn and a urinalysis was collected.  Urinalysis shows a cloudy specimen with large amount of hemoglobin 100 of protein, moderate leukocytes, 21-50 white blood cells and clumps of white blood cells.  These results are nearly exactly like his last visit.  Basic metabolic panel shows a BUN of 32 with a creatinine of 1.33 and a GFR of 59.  In comparison to previous, these numbers look slightly improved.  CBC shows a normal white blood cell count stable  anemia at 10.6 and is otherwise unremarkable.  CT scan today is without acute changes in comparison to previous.  The right nephroureteral stent is in good position and previously identified stones are stable in the location.  Plan will be to give him nausea and pain medications.  Urine is concerning for cystitis.  Plan will be to give him 3 days of Keflex and have him call and schedule an earlier appointment with his urologist.  ----------------------------------------- 7:19 PM on 12/07/2023 ----------------------------------------- Patient reports some improvement of pain after medications given here.  Plan will be to discharge him home on a 3-day course of Keflex and have him continue his other medications as prescribed.  He was also encouraged to call the urologist to see if he can be get an earlier appointment.      FINAL CLINICAL IMPRESSION(S) / ED DIAGNOSES   Final diagnoses:  Kidney stone  Right flank pain  Cystitis     Rx / DC Orders   ED Discharge Orders          Ordered    cephALEXin (KEFLEX) 500 MG capsule  3 times daily        12/07/23 1913             Note:  This document was prepared using Dragon voice recognition software and may include unintentional dictation errors.   Chinita Pester, FNP 12/07/23 Bebe Shaggy, MD 12/07/23 2344

## 2023-12-07 NOTE — Discharge Instructions (Signed)
Please call your urologist and schedule an appointment.  Return to the ER for symptoms that change or worsen if unable to see your primary care provider or the specialist.

## 2023-12-07 NOTE — ED Provider Triage Note (Signed)
Emergency Medicine Provider Triage Evaluation Note  Ronnie Mann , a 68 y.o. male  was evaluated in triage.  Pt complains of right sided flank pain, history of kidney stones. Mild dysuria, +hematuria. N/V. No fevers.  Review of Systems  Positive: Flank pain, dysuria, hematuria Negative: fever  Physical Exam  Ht 5\' 9"  (1.753 m)   Wt 91.2 kg   BMI 29.68 kg/m  Gen:   Awake, no distress   Resp:  Normal effort  MSK:   Moves extremities without difficulty  Other:    Medical Decision Making  Medically screening exam initiated at 3:26 PM.  Appropriate orders placed.  Ronnie Mann was informed that the remainder of the evaluation will be completed by another provider, this initial triage assessment does not replace that evaluation, and the importance of remaining in the ED until their evaluation is complete.     Ronnie Hoehn, PA-C 12/07/23 1528

## 2023-12-07 NOTE — ED Triage Notes (Signed)
Patient to ED via POV for right sided flank pain. Started last night. States some urinary difficulties and some blood in his urine. States he had a kidney stone last week.

## 2023-12-08 ENCOUNTER — Other Ambulatory Visit: Payer: Self-pay

## 2023-12-08 ENCOUNTER — Telehealth: Payer: Self-pay | Admitting: Pharmacist

## 2023-12-08 MED ORDER — FUROSEMIDE 20 MG PO TABS
20.0000 mg | ORAL_TABLET | Freq: Every day | ORAL | 11 refills | Status: DC
  Filled 2023-12-08: qty 30, 30d supply, fill #0
  Filled 2024-01-04: qty 30, 30d supply, fill #1

## 2023-12-08 NOTE — Addendum Note (Signed)
Addended by: Marygrace Drought on: 12/08/2023 12:04 PM   Modules accepted: Orders

## 2023-12-08 NOTE — Telephone Encounter (Signed)
Attempted to call Ronnie Mann to inform him a refill was sent in for his furosemide. Unfortunately, no IV furosemide was given yesterday. Given elevated BNP, weight gain, and symptoms, patient should take furosemide 40 mg daily x 3 days then resume 20 mg daily.

## 2023-12-11 NOTE — Telephone Encounter (Signed)
Called pt to f/u. He reports he did get the Furosemide and took the 40 mg dose. He reports wt is down 1/17 200 lbs-->194.6 lbs 1/20, reports SOB is the same and he still feels bad, occ dizziness, nausea and pain, he is sch for lithotripsy 01/22/24 and is supposed to have scan of L kidney 12/28/23. He has already sch f/u appt for Mon 1/27 with Clarisa Kindred NP, I advised I would relay info to Nason and Inetta Fermo if anything further needed we will call him back, he is agreeable.

## 2023-12-14 NOTE — Progress Notes (Deleted)
 PCP: Margarita Mail, DO (last seen 01/25) Primary Cardiologist: Julien Nordmann, MD (saw during 06/24 admission)  Chief Complaint:    HPI:  [name] is a 68 y/o male with a history of NSTEMI, s/p PCI to proximal LAD and RCA on 05/01/23, HFrEF(EF 30 to 35%), previous tobacco/ alcohol use, HTN, CKD, UTI, hyperlipidemia, kidney stones since the age of 2, right uretal stone s/p stent 10/12/23 and bilateral nephrolithiasis.   Admitted 04/28/23 due to new onset of substernal "vice like" substernal chest pain that radiated to his left arm. He reports ongoing shortness of breath associated with this. He also was awoken from sleep with this last night and had broken out into a cold sweat. Elevated lactic acid and elevated WBC at 14.4. 1 L of IVF given. Initial troponin was 54 rose to 234. 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 Developed flash pulmonary edema during left heart cath 06/10 and was treated with nitroglycerin drip. Diuretics held. Status post successful balloon angioplasty to the proximal LAD and drug-eluting stent placement to the proximal right coronary artery. Treated for pneumonia with antibiotics. Was in the ED 05/13/23 due to bilateral flank and suprapubic pain. CT renal was stable. Admitted 05/16/23 due to shortness of breath, bilateral flank pain radiating towards lower abdomen, more on right than left, dysuria and gross hematuria. Patient deferred urology stent placement. Able to void after foley removed. Brilinta was switched to Plavix, aspirin discontinued in the setting of hematuria by cardiologist    Admitted 10/10/23 due to shortness of breath, hematuria and flank pain. Started on IV lasix for HF exacerbation. Cardiology consulted and feel respiratory symptoms more related to bronchitis. Lasix held and solu-medrol provided. Respiratory panel negative. Urology consulted and stent placed. Losartan was changed to entresto.   Admitted 11/04/23 due  to right flank pain. RUQ sono showed cholelithiasis with positive sonographic Murphy's borderline thickening of gallbladder wall at fundus suspicious for acute cholecystitis.  CT renal stone study showed right ureteral stent in position, persistent calculi in right ureter, pelvis and proximal mid left ureter.  Surgery team evaluated the patient in the ED, advised HIDA scan which ruled out acute cholecystitis. Entresto, lasix and spironolactone held due to low BP. To follow up with [facility] urology early January 2025.  Has had 2 ED visits 01/25 due to pain from his kidney stone  Echo 04/30/23: EF 30-35% along with mild LVH and Grade II DD and moderate MR.  Echo 10/13/23: EF 25-30% with moderate LAE, moderate MR  LHC 05/01/23:   Prox LAD to Mid LAD lesion is 99% stenosed.   Mid LAD lesion is 60% stenosed.   1st Diag lesion is 70% stenosed.   Prox RCA lesion is 95% stenosed.   Mid RCA lesion is 30% stenosed.   RPDA lesion is 60% stenosed.   Dist LAD lesion is 99% stenosed.   A drug-eluting stent was successfully placed using a STENT ONYX FRONTIER 4.0X15.   Balloon angioplasty was performed using a BALLN [place] EUPHORA RX 2.75X20.   Post intervention, there is a 20% residual stenosis.   Post intervention, there is a 0% residual stenosis.   There is severe left ventricular systolic dysfunction.   LV end diastolic pressure is severely elevated.   There is moderate (3+) mitral regurgitation.  1.  Severe two-vessel coronary artery disease with subtotal occlusion of the proximal LAD, diffuse moderate mid LAD disease and severe distal LAD disease.  In addition, there is 95% in the  proximal right coronary artery with faint right to left collaterals to the LAD.  The left circumflex has mild nonobstructive disease and OM 3 is large and reaches the apex. 2.  Severely reduced LV systolic function with an EF of 25%.  Severely elevated left ventricular end-diastolic pressure at 34 mmHg. 3.  Successful balloon angioplasty  to the proximal LAD and drug-eluting stent placement to the proximal right coronary artery.  He presents today for a HF f/u visit with a chief complaint of     ROS: All systems negative except as listed in HPI, PMH and Problem List.  SH:  Social History   Socioeconomic History   Marital status: Divorced    Spouse name: Not on file   Number of children: Not on file   Years of education: Not on file   Highest education level: 10th grade  Occupational History   Not on file  Tobacco Use   Smoking status: Never    Passive exposure: Never   Smokeless tobacco: Never  Vaping Use   Vaping status: Never Used  Substance and Sexual Activity   Alcohol use: Never   Drug use: Yes    Types: Marijuana   Sexual activity: Not on file  Other Topics Concern   Not on file  Social History Narrative   Not on file   Social Drivers of Health   Financial Resource Strain: Low Risk  (10/12/2023)   Overall Financial Resource Strain (CARDIA)    Difficulty of Paying Living Expenses: Not hard at all  Food Insecurity: No Food Insecurity (11/05/2023)   Hunger Vital Sign    Worried About Running Out of Food in the Last Year: Never true    Ran Out of Food in the Last Year: Never true  Transportation Needs: No Transportation Needs (11/05/2023)   PRAPARE - Administrator, Civil Service (Medical): No    Lack of Transportation (Non-Medical): No  Physical Activity: Not on file  Stress: Not on file  Social Connections: Not on file  Intimate Partner Violence: Not At Risk (11/05/2023)   Humiliation, Afraid, Rape, and Kick questionnaire    Fear of Current or Ex-Partner: No    Emotionally Abused: No    Physically Abused: No    Sexually Abused: No    FH:  Family History  Problem Relation Age of Onset   Heart disease Father     Past Medical History:  Diagnosis Date   Coronary artery disease 05/01/2023   PCI/DES   HFrEF (heart failure with reduced ejection fraction) (HCC) 05/01/2023    Hiatal hernia    HLD (hyperlipidemia)    HTN (hypertension)    Ischemic cardiomyopathy 05/01/2023   LVEF 30-35%   Kidney stones     Current Outpatient Medications  Medication Sig Dispense Refill   albuterol (VENTOLIN HFA) 108 (90 Base) MCG/ACT inhaler Inhale 2 puffs into the lungs every 6 (six) hours as needed for wheezing or shortness of breath. 17 each 0   aspirin EC 81 MG tablet Take 81 mg by mouth daily. Swallow whole.     atorvastatin (LIPITOR) 40 MG tablet Take 1 tablet (40 mg total) by mouth daily. 30 tablet 0   fesoterodine (TOVIAZ) 4 MG TB24 tablet Take 4 mg by mouth daily.     furosemide (LASIX) 20 MG tablet Take 1 tablet (20 mg total) by mouth daily. 30 tablet 11   metoprolol succinate (TOPROL-XL) 25 MG 24 hr tablet Take 1 tablet (25 mg total) by mouth  daily. 90 tablet 3   ondansetron (ZOFRAN-ODT) 4 MG disintegrating tablet Take 1 tablet (4 mg total) by mouth every 6 (six) hours as needed for nausea or vomiting. 20 tablet 0   oxyCODONE-acetaminophen (PERCOCET) 5-325 MG tablet Take 2 tablets by mouth every 8 (eight) hours as needed. 20 tablet 0   polyethylene glycol (MIRALAX / GLYCOLAX) 17 g packet Take 17 g by mouth daily. 30 each 0   sacubitril-valsartan (ENTRESTO) 24-26 MG Take 1 tablet by mouth 2 (two) times daily. 60 tablet 3   [Paused] spironolactone (ALDACTONE) 25 MG tablet Take 1 tablet (25 mg total) by mouth daily. (Patient not taking: Reported on 11/13/2023) 45 tablet 3   tamsulosin (FLOMAX) 0.4 MG CAPS capsule Take 1 capsule (0.4 mg total) by mouth daily after supper. 30 capsule 2   traZODone (DESYREL) 50 MG tablet Take 0.5-1 tablets (25-50 mg total) by mouth at bedtime as needed for sleep. 90 tablet 0   Vibegron (GEMTESA) 75 MG TABS Take 75 mg by mouth daily. (Patient not taking: Reported on 11/29/2023)     No current facility-administered medications for this visit.     PHYSICAL EXAM:  General:  Appears uncomfortable. No resp difficulty HEENT: normal Neck:  supple. JVP flat. No lymphadenopathy or thryomegaly appreciated. Cor: PMI normal. Regular rate & rhythm. No rubs, gallops or murmurs. Lungs: clear Abdomen: soft, nontender, nondistended. No hepatosplenomegaly. No bruits or masses.  Extremities: no cyanosis, clubbing, rash, edema Neuro: alert & oriented x3, cranial nerves grossly intact. Moves all 4 extremities w/o difficulty. Affect pleasant.   ECG: not done   ASSESSMENT & PLAN:  1: Ischemic heart failure with reduced ejection fraction- - NSTEMI with PCI 06/24 - NYHA class II - euvolemic - weighing daily; reminded to call for an overnight weight gain of > 2 pounds or a weekly weight gain of > 5 pounds - weight 203 pounds from last visit here 1 month ago - Echo 04/30/23: EF 30-35% along with mild LVH and Grade II DD and moderate MR.  - Echo 10/13/23: EF 25-30% with moderate LAE, moderate MR - continue metoprolol succinate 25mg  daily - continue holding furosemide, entresto & spironolactone; they were held during recent admission due to hypotension  - due to chronic kidney stone; may not be a good candidate for SGLT2 - BNP 12/07/23 was 1051.7  2: CAD- - was  NS with cardiology Mariah Milling) 08/24; this will need to be R/S - continue atorvastatin 40mg  daily - continue ASA 81mg  daily - LHC 05/01/23:   Prox LAD to Mid LAD lesion is 99% stenosed.   Mid LAD lesion is 60% stenosed.   1st Diag lesion is 70% stenosed.   Prox RCA lesion is 95% stenosed.   Mid RCA lesion is 30% stenosed.   RPDA lesion is 60% stenosed.   Dist LAD lesion is 99% stenosed.   A drug-eluting stent was successfully placed using a STENT ONYX FRONTIER 4.0X15.   Balloon angioplasty was performed using a BALLN [place] EUPHORA RX 2.75X20.   Post intervention, there is a 20% residual stenosis.   Post intervention, there is a 0% residual stenosis.   There is severe left ventricular systolic dysfunction.   LV end diastolic pressure is severely elevated.   There is moderate (3+)  mitral regurgitation.  1.  Severe two-vessel coronary artery disease with subtotal occlusion of the proximal LAD, diffuse moderate mid LAD disease and severe distal LAD disease.  In addition, there is 95% in the proximal right coronary artery  with faint right to left collaterals to the LAD.  The left circumflex has mild nonobstructive disease and OM 3 is large and reaches the apex. 2.  Severely reduced LV systolic function with an EF of 25%.  Severely elevated left ventricular end-diastolic pressure at 34 mmHg. 3.  Successful balloon angioplasty to the proximal LAD and drug-eluting stent placement to the proximal right coronary artery.  3: Kidney disease- - saw urology Dannette Barbara) 12/24; has [facility] urology appt 01/25 - has bilateral staghorn renal stones  - right ureteral stent placed 10/12/23  - PCNL 03/25  4: HTN- - BP  - saw PCP Caralee Ates) 01/25 - BMP 12/07/23 showed sodium 137, potassium 4.8, creatinine 1.33 & GFR 59

## 2023-12-18 ENCOUNTER — Encounter: Payer: Medicare HMO | Admitting: Family

## 2023-12-21 ENCOUNTER — Ambulatory Visit: Payer: Medicare HMO | Attending: Family | Admitting: Family

## 2023-12-21 ENCOUNTER — Encounter: Payer: Self-pay | Admitting: Family

## 2023-12-21 VITALS — BP 126/83 | HR 79 | Wt 199.4 lb

## 2023-12-21 DIAGNOSIS — Z7982 Long term (current) use of aspirin: Secondary | ICD-10-CM | POA: Diagnosis not present

## 2023-12-21 DIAGNOSIS — E785 Hyperlipidemia, unspecified: Secondary | ICD-10-CM | POA: Diagnosis not present

## 2023-12-21 DIAGNOSIS — I340 Nonrheumatic mitral (valve) insufficiency: Secondary | ICD-10-CM | POA: Diagnosis not present

## 2023-12-21 DIAGNOSIS — N200 Calculus of kidney: Secondary | ICD-10-CM | POA: Diagnosis not present

## 2023-12-21 DIAGNOSIS — I130 Hypertensive heart and chronic kidney disease with heart failure and stage 1 through stage 4 chronic kidney disease, or unspecified chronic kidney disease: Secondary | ICD-10-CM | POA: Diagnosis not present

## 2023-12-21 DIAGNOSIS — I252 Old myocardial infarction: Secondary | ICD-10-CM | POA: Insufficient documentation

## 2023-12-21 DIAGNOSIS — I5022 Chronic systolic (congestive) heart failure: Secondary | ICD-10-CM | POA: Insufficient documentation

## 2023-12-21 DIAGNOSIS — R109 Unspecified abdominal pain: Secondary | ICD-10-CM | POA: Insufficient documentation

## 2023-12-21 DIAGNOSIS — Z955 Presence of coronary angioplasty implant and graft: Secondary | ICD-10-CM | POA: Insufficient documentation

## 2023-12-21 DIAGNOSIS — I2510 Atherosclerotic heart disease of native coronary artery without angina pectoris: Secondary | ICD-10-CM | POA: Insufficient documentation

## 2023-12-21 DIAGNOSIS — I10 Essential (primary) hypertension: Secondary | ICD-10-CM | POA: Diagnosis not present

## 2023-12-21 DIAGNOSIS — Z79899 Other long term (current) drug therapy: Secondary | ICD-10-CM | POA: Insufficient documentation

## 2023-12-21 DIAGNOSIS — K8020 Calculus of gallbladder without cholecystitis without obstruction: Secondary | ICD-10-CM | POA: Insufficient documentation

## 2023-12-21 DIAGNOSIS — R0602 Shortness of breath: Secondary | ICD-10-CM | POA: Diagnosis present

## 2023-12-21 NOTE — Patient Instructions (Signed)
Medication Changes:  No Changes In Medications at this time.   Lab Work:  Go DOWN to LOWER LEVEL (LL) to have your blood work completed inside of [employer] office.  We will only call you if the results are abnormal or if the provider would like to make medication changes.  Follow-Up in: 2 weeks   If you have any questions or concerns before your next appointment please send [place] a message through mychart or call our office at [phone] Monday-Friday 8 am-5 pm.   If you have an urgent need after hours on the weekend please call your Primary Cardiologist or the [facility] in [place] at [phone].   At the [facility], you and your health needs are our priority. We have a designated team specialized in the treatment of Heart Failure. This Care Team includes your primary Heart Failure Specialized Cardiologist (physician), Advanced Practice Providers (APPs- Physician Assistants and Nurse Practitioners), and Pharmacist who all work together to provide you with the care you need, when you need it.   You may see any of the following providers on your designated Care Team at your next follow up:  Dr. Arvilla Meres Dr. Marca Ancona Dr. Dorthula Nettles Dr. Theresia Bough Tonye Becket, NP Robbie Lis, [place] [address] [place], [place] Brynda Peon, NP [place] Lee, NP Clarisa Kindred, NP Enos Fling, PharmD

## 2023-12-21 NOTE — Progress Notes (Addendum)
Advanced Heart Failure Clinic Note    PCP: Ronnie Mail, DO (last seen 01/25) Primary Cardiologist: Julien Nordmann, MD (saw during 06/24 admission)  Chief Complaint: flank pain/ shortness of breath   HPI:  Ronnie Mann is a 68 y/o male with a history of NSTEMI, s/p PCI to proximal LAD and RCA on 05/01/23, HFrEF(EF 30 to 35%), previous tobacco/ alcohol use, HTN, CKD, UTI, hyperlipidemia, kidney stones since the age of 70, right uretal stone s/p stent 10/12/23 and bilateral nephrolithiasis.   Admitted 04/28/23 due to new onset of substernal "vice like" substernal chest pain that radiated to his left arm. He reports ongoing shortness of breath associated with this. He also was awoken from sleep with this last night and had broken out into a cold sweat. Elevated lactic acid and elevated WBC at 14.4. 1 L of IVF given. Initial troponin was 54 rose to 234. 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 Developed flash pulmonary edema during left heart cath 06/10 and was treated with nitroglycerin drip. Diuretics held. Status post successful balloon angioplasty to the proximal LAD and drug-eluting stent placement to the proximal right coronary artery. Treated for pneumonia with antibiotics. Was in the ED 05/13/23 due to bilateral flank and suprapubic pain. CT renal was stable. Admitted 05/16/23 due to shortness of breath, bilateral flank pain radiating towards lower abdomen, more on right than left, dysuria and gross hematuria. Patient deferred urology stent placement. Able to void after foley removed. Brilinta was switched to Plavix, aspirin discontinued in the setting of hematuria by cardiologist    Admitted 10/10/23 due to shortness of breath, hematuria and flank pain. Started on IV lasix for HF exacerbation. Cardiology consulted and feel respiratory symptoms more related to bronchitis. Lasix held and solu-medrol provided. Respiratory panel negative. Urology consulted  and stent placed. Losartan was changed to entresto.   Admitted 11/04/23 due to right flank pain. RUQ sono showed cholelithiasis with positive sonographic Murphy's borderline thickening of gallbladder wall at fundus suspicious for acute cholecystitis.  CT renal stone study showed right ureteral stent in position, persistent calculi in right ureter, pelvis and proximal mid left ureter.  Surgery team evaluated the patient in the ED, advised HIDA scan which ruled out acute cholecystitis. Entresto, lasix and spironolactone held due to low BP. To follow up with [facility] early January 2025.  Has had 2 ED visits 01/25 due to pain from his kidney stone  Echo 04/30/23: EF 30-35% along with mild LVH and Grade II DD and moderate Ronnie.  Echo 10/13/23: EF 25-30% with moderate LAE, moderate Ronnie  LHC 05/01/23:   Prox LAD to Mid LAD lesion is 99% stenosed.   Mid LAD lesion is 60% stenosed.   1st Diag lesion is 70% stenosed.   Prox RCA lesion is 95% stenosed.   Mid RCA lesion is 30% stenosed.   RPDA lesion is 60% stenosed.   Dist LAD lesion is 99% stenosed.   A drug-eluting stent was successfully placed using a STENT ONYX FRONTIER 4.0X15.   Balloon angioplasty was performed using a BALLN [place] Mann RX 2.75X20.   Post intervention, there is a 20% residual stenosis.   Post intervention, there is a 0% residual stenosis.   There is severe left ventricular systolic dysfunction.   LV end diastolic pressure is severely elevated.   There is moderate (3+) mitral regurgitation.  1.  Severe two-vessel coronary artery disease with subtotal occlusion of the proximal LAD, diffuse moderate mid LAD disease and  severe distal LAD disease.  In addition, there is 95% in the proximal right coronary artery with faint right to left collaterals to the LAD.  The left circumflex has mild nonobstructive disease and OM 3 is large and reaches the apex. 2.  Severely reduced LV systolic function with an EF of 25%.  Severely elevated left  ventricular end-diastolic pressure at 34 mmHg. 3.  Successful balloon angioplasty to the proximal LAD and drug-eluting stent placement to the proximal right coronary artery.  He presents today for a HF f/u visit with a chief complaint of right sided flank/ abdominal pain. This has been present for many months and he has now run out of pain medication as well as nausea medication. He has placed a call into urology regarding this and is still waiting for a return call. Currently scheduled for PCNL on 01/22/24 and he voices frustration with how long this process has taken. Has associated nausea, fatigue, SOB, cough and decreased appetite along with this. Denies chest pain, palpitations, abdominal distention, pedal edema, dizziness or weight gain.   Since last visit, he took 40mg  furosemide for 3 days due to worsening symptoms and elevated BNP. After the 3 days, he resumed his normal 20mg  dose. Says that he's felt fine from a HF standpoint since then. Spironolactone continues to be on hold.   ROS: All systems negative except as listed in HPI, PMH and Problem List.  SH:  Social History   Socioeconomic History   Marital status: Divorced    Spouse name: Not on file   Number of children: Not on file   Years of education: Not on file   Highest education level: 10th grade  Occupational History   Not on file  Tobacco Use   Smoking status: Never    Passive exposure: Never   Smokeless tobacco: Never  Vaping Use   Vaping status: Never Used  Substance and Sexual Activity   Alcohol use: Never   Drug use: Yes    Types: Marijuana   Sexual activity: Not on file  Other Topics Concern   Not on file  Social History Narrative   Not on file   Social Drivers of Health   Financial Resource Strain: Low Risk  (10/12/2023)   Overall Financial Resource Strain (CARDIA)    Difficulty of Paying Living Expenses: Not hard at all  Food Insecurity: No Food Insecurity (11/05/2023)   Hunger Vital Sign    Worried  About Running Out of Food in the Last Year: Never true    Ran Out of Food in the Last Year: Never true  Transportation Needs: No Transportation Needs (11/05/2023)   PRAPARE - Administrator, Civil Service (Medical): No    Lack of Transportation (Non-Medical): No  Physical Activity: Not on file  Stress: Not on file  Social Connections: Not on file  Intimate Partner Violence: Not At Risk (11/05/2023)   Humiliation, Afraid, Rape, and Kick questionnaire    Fear of Current or Ex-Partner: No    Emotionally Abused: No    Physically Abused: No    Sexually Abused: No    FH:  Family History  Problem Relation Age of Onset   Heart disease Father     Past Medical History:  Diagnosis Date   Coronary artery disease 05/01/2023   PCI/DES   HFrEF (heart failure with reduced ejection fraction) (HCC) 05/01/2023   Hiatal hernia    HLD (hyperlipidemia)    HTN (hypertension)    Ischemic cardiomyopathy 05/01/2023  LVEF 30-35%   Kidney stones     Current Outpatient Medications  Medication Sig Dispense Refill   albuterol (VENTOLIN HFA) 108 (90 Base) MCG/ACT inhaler Inhale 2 puffs into the lungs every 6 (six) hours as needed for wheezing or shortness of breath. 17 each 0   aspirin EC 81 MG tablet Take 81 mg by mouth daily. Swallow whole.     atorvastatin (LIPITOR) 40 MG tablet Take 1 tablet (40 mg total) by mouth daily. 30 tablet 0   fesoterodine (TOVIAZ) 4 MG TB24 tablet Take 4 mg by mouth daily.     furosemide (LASIX) 20 MG tablet Take 1 tablet (20 mg total) by mouth daily. 30 tablet 11   metoprolol succinate (TOPROL-XL) 25 MG 24 hr tablet Take 1 tablet (25 mg total) by mouth daily. 90 tablet 3   ondansetron (ZOFRAN-ODT) 4 MG disintegrating tablet Take 1 tablet (4 mg total) by mouth every 6 (six) hours as needed for nausea or vomiting. 20 tablet 0   oxyCODONE-acetaminophen (PERCOCET) 5-325 MG tablet Take 2 tablets by mouth every 8 (eight) hours as needed. 20 tablet 0   polyethylene  glycol (MIRALAX / GLYCOLAX) 17 g packet Take 17 g by mouth daily. 30 each 0   sacubitril-valsartan (ENTRESTO) 24-26 MG Take 1 tablet by mouth 2 (two) times daily. 60 tablet 3   [Paused] spironolactone (ALDACTONE) 25 MG tablet Take 1 tablet (25 mg total) by mouth daily. (Patient not taking: Reported on 11/13/2023) 45 tablet 3   tamsulosin (FLOMAX) 0.4 MG CAPS capsule Take 1 capsule (0.4 mg total) by mouth daily after supper. 30 capsule 2   traZODone (DESYREL) 50 MG tablet Take 0.5-1 tablets (25-50 mg total) by mouth at bedtime as needed for sleep. 90 tablet 0   Vibegron (GEMTESA) 75 MG TABS Take 75 mg by mouth daily. (Patient not taking: Reported on 11/29/2023)     No current facility-administered medications for this visit.   Vitals:   12/21/23 1337  BP: 126/83  Pulse: 79  SpO2: 99%  Weight: 199 lb 6.4 oz (90.4 kg)   Wt Readings from Last 3 Encounters:  12/21/23 199 lb 6.4 oz (90.4 kg)  12/07/23 201 lb (91.2 kg)  11/29/23 207 lb 12.8 oz (94.3 kg)   Lab Results  Component Value Date   CREATININE 1.33 (H) 12/07/2023   CREATININE 1.41 (H) 12/07/2023   CREATININE 1.43 (H) 12/02/2023   PHYSICAL EXAM:  General:  Appears uncomfortable. No resp difficulty HEENT: normal Neck: supple. JVP flat. Cor: PMI normal. Regular rate & rhythm. No rubs, gallops or murmurs. Lungs: expiratory wheezing in bilateral lower lobes Abdomen: soft, tender lower right side of abdomen, nondistended.  Extremities: no cyanosis, clubbing, rash, edema Neuro: alert & oriented x3. Moves all 4 extremities w/o difficulty. Affect pleasant.   ECG: not done  ReDs reading: 32 %, normal   ASSESSMENT & PLAN:  1: Ischemic heart failure with reduced ejection fraction- - NSTEMI with PCI 06/24 - NYHA class II - euvolemic - weighing daily; reminded to call for an overnight weight gain of > 2 pounds or a weekly weight gain of > 5 pounds - weight down 4 pounds from last visit here 1 month ago - ReDs reading 32%; no  change in diuretic therapy - Echo 04/30/23: EF 30-35% along with mild LVH and Grade II DD and moderate Ronnie.  - Echo 10/13/23: EF 25-30% with moderate LAE, moderate Ronnie - continue furosemide 20mg  daily - continue metoprolol succinate 25mg  daily - continue  entresto 24/26mg  BID - MRA still on hold but will consider adding at next visit if labs/ BP maintains - due to chronic kidney stone; may not be a good candidate for SGLT2 - BNP 12/07/23 was 1051.7 - pro-BNP today  2: CAD- - was  NS with cardiology Ronnie Mann) 08/24; this needs R/S but his priority is urology issues right now - continue atorvastatin 40mg  daily - continue ASA 81mg  daily - LHC 05/01/23:   Prox LAD to Mid LAD lesion is 99% stenosed.   Mid LAD lesion is 60% stenosed.   1st Diag lesion is 70% stenosed.   Prox RCA lesion is 95% stenosed.   Mid RCA lesion is 30% stenosed.   RPDA lesion is 60% stenosed.   Dist LAD lesion is 99% stenosed.   A drug-eluting stent was successfully placed using a STENT ONYX FRONTIER 4.0X15.   Balloon angioplasty was performed using a BALLN Ronnie Mann RX 2.75X20.   Post intervention, there is a 20% residual stenosis.   Post intervention, there is a 0% residual stenosis.   There is severe left ventricular systolic dysfunction.   LV end diastolic pressure is severely elevated.   There is moderate (3+) mitral regurgitation.  1.  Severe two-vessel coronary artery disease with subtotal occlusion of the proximal LAD, diffuse moderate mid LAD disease and severe distal LAD disease.  In addition, there is 95% in the proximal right coronary artery with faint right to left collaterals to the LAD.  The left circumflex has mild nonobstructive disease and OM 3 is large and reaches the apex. 2.  Severely reduced LV systolic function with an EF of 25%.  Severely elevated left ventricular end-diastolic pressure at 34 mmHg. 3.  Successful balloon angioplasty to the proximal LAD and drug-eluting stent placement to the proximal  right coronary artery.  3: Kidney stones (managed by urology)- - followed by [facility] urology (last seen 01/25) - has bilateral staghorn renal stones  - right ureteral stent placed 10/12/23  - PCNL scheduled for 03/25 - he is waiting on a return call back regarding refilling his pain/ nausea medications  4: HTN- - BP 126/83 - saw PCP Ronnie Mann) 01/25 - BMP 12/07/23 showed sodium 137, potassium 4.8, creatinine 1.33 & GFR 59 - BMET today   Return in 2 weeks to see pharmacy, sooner if needed.   Delma Freeze, FNP 12/21/23

## 2023-12-21 NOTE — Addendum Note (Signed)
Addended by: Clarisa Kindred A on: 12/21/2023 03:27 PM   Modules accepted: Orders

## 2023-12-22 LAB — BASIC METABOLIC PANEL
BUN/Creatinine Ratio: 18 (ref 10–24)
BUN: 30 mg/dL — ABNORMAL HIGH (ref 8–27)
CO2: 22 mmol/L (ref 20–29)
Calcium: 9.4 mg/dL (ref 8.6–10.2)
Chloride: 101 mmol/L (ref 96–106)
Creatinine, Ser: 1.65 mg/dL — ABNORMAL HIGH (ref 0.76–1.27)
Glucose: 129 mg/dL — ABNORMAL HIGH (ref 70–99)
Potassium: 4.9 mmol/L (ref 3.5–5.2)
Sodium: 138 mmol/L (ref 134–144)
eGFR: 45 mL/min/{1.73_m2} — ABNORMAL LOW (ref >59)

## 2023-12-22 LAB — PRO B NATRIURETIC PEPTIDE: NT-Pro BNP: 1262 pg/mL — ABNORMAL HIGH (ref 0–376)

## 2023-12-26 ENCOUNTER — Telehealth: Payer: Self-pay

## 2023-12-26 NOTE — Telephone Encounter (Addendum)
 Pt aware, agreeable, and verbalized understanding   ----- Message from Ronnie Mann Class sent at 12/25/2023  8:58 AM EST ----- Kidney function is a little worse after recent ED visit. Drink 60-64 ounces of fluid daily. Continue current medications if weight is stable. Will recheck at upcoming pharmacy visit.

## 2024-01-03 NOTE — Progress Notes (Signed)
Advanced Heart Failure Clinic Note  PCP: Pcp, No  PCP-Cardiologist: Julien Nordmann, MD  HF-Cardiologist: Dr. Dorthula Nettles  HPI:  [name] is a 68 y/o male with a history of NSTEMI, s/p PCI to proximal LAD and RCA on 05/01/23, HFrEF(EF 30 to 35%), previous tobacco/ alcohol use, kidney stones since the age of 72 and bilateral nephrolithiasis.   Admitted 04/28/23 due to new onset of substernal "vice like" substernal chest pain that radiated to his left arm and shortness of breath. Developed flash pulmonary edema during left heart cath 06/10 and was treated with nitroglycerin drip. Status post successful balloon angioplasty to the proximal LAD and drug-eluting stent placement to the proximal right coronary artery.   Echo 04/30/23 showed LVEF 30-35% along with mild LVH and Grade II DD and moderate MR. Seen by Advanced HF team during admission on 05/03/23.  Visited ED 05/13/23 due to bilateral flank and suprapubic pain. CT renal was stable.   Admitted 05/16/23 due to shortness of breath, bilateral flank pain radiating towards lower abdomen, more on right than left, dysuria and gross hematuria. Patient deferred urology stent placement. Able to void after foley removed. Brilinta was switched to Plavix, aspirin discontinued in the setting of hematuria by cardiologist     Seen in CHF clinic on 06/09/23 where losartan 25 mg daily was added, however losartan was stopped shortly after due to complaints of chest pain/palpitations.   CHF clinic on 06/30/23 where spironolactone 12.5 mg daily was added and metoprolol was increased to 25 mg daily.   Seen in CHF pharmacy clinic on 07/24/23 where patient reported accidentally increasing metoprolol to 37.5 mg daily and began to feel poorly. The dose was reduced back to 25 mg daily as intended and Entresto 24-26 mg bid was started.   Seen by CHF pharmacy 08/30/23 where spironolactone was started.  Admitted 10/10/23 with bronchitis and kidney stone requiring stent.  Seen by Clarisa Kindred 10/23/23 where the patient was confused about taking losartan vs Entresto. Sherryll Burger was resumed and losartan was stopped.  Admitted 11/04/23 for kidney pain. Furosemide, Entresto, and spironolactone were held for low BP.  Seen by Clarisa Kindred on 11/13/23 where furosemide 20 mg daily was resumed.  Seen by PCP on 11/26/22 where weight was up ~10 lbs. Furosemide was resumed at 10 mg daily.  Seen by CHF pharmacy on 11/29/23 where patient was hypervolemic with DOE and orthopnea. Due to hypertension and hypervolemia Entresto was restarted and furosemide was increased to 20 mg daily after a boosted dose.  Presented to the ED 12/03/23 due to renal pain. Discharged with pain medications and ondansetron.   Seen in CHF pharmacy clinic 12/07/23. Patient ran out of furosemide 3 days prior, weight was up, and patient reported shortness of breath and renal stone pain. Was sent to the ED for evaluation. No furosemide was given in the ED. Given elevated BNP, Mr. Manera was told to double Lasix for 3 days.  Seen by Clarisa Kindred on 12/21/23. Patient reported persistent kidney stone pain, but improved respiratory symptoms. No changes were made.  Today [name] returns to Heart Failure Clinic for pharmacist medication titration. Reports feeling much better from a CHF perspective since last visit. Recently presented to an OSH for kidney pain and nausea. Reports his fatigue and shortness of breath have been at baseline the last couple of weeks. Does report orthopnea last night. Reports shortness of breath with moderate exertion. Denies chest pain, dizziness, orthostasis, palpitations, and PND. Reports being able to complete  all activities of daily living (ADLs) with frequent breaks. Is minimally active throughout the day. Weight at home is ~194-197 lbs, up this morning. Takes furosemide 20 mg daily. Appetite is poor. Does follow a low sodium diet.    Assessment/Plan:  1: Ischemic heart failure with  reduced ejection fraction- - NSTEMI with PCI 06/24 - NYHA class II - Euvolemic to slightly volume up. Reminded patient to take and additional furosemide tablet for an overnight weight gain of > 2 pounds or a weekly weight gain of > 5 pounds - weight up 2 lbs from last visit - Echo 04/30/23: EF 30-35% along with mild LVH and Grade II DD and moderate MR.  - Echo 10/13/23: EF 25-30% with moderate LAE, moderate MR - continue furosemide 20mg  daily with an additional 20 mg prn for weight gain and shortness of breath. - Continue metoprolol succinate 25mg  daily with HR in 60s. - Continue entresto 24/26mg  BID. BP allows for increase, however given stable symptoms and renal function, will wait until after stent placement for further titration. - Creatinine at recent hospitalization had improved to 1.15 and potassium to 4.5. MRA still on hold. Given patient is stable and has had difficulty with medication titration in recent months, will wait until after renal procedure on 01/22/24 to restart spironolactone. - Due to chronic kidney stone; not an ideal candidate for SGLT2     2: CAD- - Continue atorvastatin 40mg  daily - Continue ASA 81mg  daily - LHC 05/01/23:   Prox LAD to Mid LAD lesion is 99% stenosed.   Mid LAD lesion is 60% stenosed.   1st Diag lesion is 70% stenosed.   Prox RCA lesion is 95% stenosed.   Mid RCA lesion is 30% stenosed.   RPDA lesion is 60% stenosed.   Dist LAD lesion is 99% stenosed.   A drug-eluting stent was successfully placed using a STENT ONYX FRONTIER 4.0X15.   Balloon angioplasty was performed using a BALLN [place]A RX 2.75X20.   Post intervention, there is a 20% residual stenosis.   Post intervention, there is a 0% residual stenosis.   There is severe left ventricular systolic dysfunction.   LV end diastolic pressure is severely elevated.   There is moderate (3+) mitral regurgitation.  1.  Severe two-vessel coronary artery disease with subtotal occlusion of the proximal  LAD, diffuse moderate mid LAD disease and severe distal LAD disease.  In addition, there is 95% in the proximal right coronary artery with faint right to left collaterals to the LAD.  The left circumflex has mild nonobstructive disease and OM 3 is large and reaches the apex. 2.  Severely reduced LV systolic function with an EF of 25%.  Severely elevated left ventricular end-diastolic pressure at 34 mmHg. 3.  Successful balloon angioplasty to the proximal LAD and drug-eluting stent placement to the proximal right coronary artery.   3: Kidney stones (managed by urology)- - Followed by [facility] urology  - Has bilateral staghorn renal stones  - Right ureteral stent placed 10/12/23  - PCNL scheduled for 01/2024 - Recent hospitalization for kidney pain   4: HTN- - BP 126/83 - saw PCP Caralee Ates) 01/25 - Renal function and electrolytes on 01/01/24 were stable.  Enos Fling, PharmD, BCPS Phone - [phone] Clinical Pharmacist 01/03/2024 3:03 PM

## 2024-01-04 ENCOUNTER — Ambulatory Visit: Payer: Medicare HMO | Attending: Internal Medicine | Admitting: Pharmacist

## 2024-01-04 DIAGNOSIS — I5022 Chronic systolic (congestive) heart failure: Secondary | ICD-10-CM | POA: Diagnosis not present

## 2024-01-04 NOTE — Patient Instructions (Signed)
It was nice seeing you today. No medication changes were made today. You are scheduled to see Clarisa Kindred here at the clinic a week after your kidney procedure. Please call if you need to reschedule. Feel free to reach out with any questions or concerns.

## 2024-01-08 ENCOUNTER — Other Ambulatory Visit: Payer: Self-pay

## 2024-01-08 MED ORDER — FUROSEMIDE 20 MG PO TABS: 20.0000 mg | ORAL_TABLET | Freq: Every day | ORAL | 3 refills | Status: DC

## 2024-01-08 MED ORDER — METOPROLOL SUCCINATE ER 25 MG PO TB24: 25.0000 mg | ORAL_TABLET | Freq: Every day | ORAL | 3 refills | Status: AC

## 2024-01-08 MED ORDER — ATORVASTATIN CALCIUM 40 MG PO TABS: 40.0000 mg | ORAL_TABLET | Freq: Every day | ORAL | 3 refills | Status: AC

## 2024-01-10 ENCOUNTER — Other Ambulatory Visit: Payer: Self-pay

## 2024-01-15 ENCOUNTER — Other Ambulatory Visit: Payer: Self-pay

## 2024-01-15 MED ORDER — SACUBITRIL-VALSARTAN 24-26 MG PO TABS: 1.0000 | ORAL_TABLET | Freq: Two times a day (BID) | ORAL | 2 refills | Status: DC

## 2024-01-15 NOTE — Telephone Encounter (Signed)
 Entresto refill requested via fax from [facility]. Rx refill sent.

## 2024-01-24 ENCOUNTER — Other Ambulatory Visit: Payer: Self-pay

## 2024-01-24 MED ORDER — SACUBITRIL-VALSARTAN 24-26 MG PO TABS: 1.0000 | ORAL_TABLET | Freq: Two times a day (BID) | ORAL | 2 refills | Status: DC

## 2024-01-29 ENCOUNTER — Telehealth: Payer: Self-pay | Admitting: Family

## 2024-01-29 NOTE — Progress Notes (Deleted)
 Advanced Heart Failure Clinic Note    PCP: Margarita Mail, DO (last seen 01/25) Primary Cardiologist: Julien Nordmann, MD (saw during 06/24 admission)  Chief Complaint: flank pain/ shortness of breath   HPI:  Ronnie Mann is a 68 y/o male with a history of NSTEMI, s/p PCI to proximal LAD and RCA on 05/01/23, HFrEF(EF 30 to 35%), previous tobacco/ alcohol use, HTN, CKD, UTI, hyperlipidemia, kidney stones since the age of 34, right uretal stone s/p stent 10/12/23 and bilateral nephrolithiasis.   Admitted 04/28/23 due to new onset of substernal "vice like" substernal chest pain that radiated to his left arm. He reports ongoing shortness of breath associated with this. He also was awoken from sleep with this last night and had broken out into a cold sweat. Elevated lactic acid and elevated WBC at 14.4. 1 L of IVF given. Initial troponin was 54 rose to 234. 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 Developed flash pulmonary edema during left heart cath 06/10 and was treated with nitroglycerin drip. Diuretics held. Status post successful balloon angioplasty to the proximal LAD and drug-eluting stent placement to the proximal right coronary artery. Treated for pneumonia with antibiotics. Was in the ED 05/13/23 due to bilateral flank and suprapubic pain. CT renal was stable. Admitted 05/16/23 due to shortness of breath, bilateral flank pain radiating towards lower abdomen, more on right than left, dysuria and gross hematuria. Patient deferred urology stent placement. Able to void after foley removed. Brilinta was switched to Plavix, aspirin discontinued in the setting of hematuria by cardiologist    Admitted 10/10/23 due to shortness of breath, hematuria and flank pain. Started on IV lasix for HF exacerbation. Cardiology consulted and feel respiratory symptoms more related to bronchitis. Lasix held and solu-medrol provided. Respiratory panel negative. Urology consulted  and stent placed. Losartan was changed to entresto.   Admitted 11/04/23 due to right flank pain. RUQ sono showed cholelithiasis with positive sonographic Murphy's borderline thickening of gallbladder wall at fundus suspicious for acute cholecystitis.  CT renal stone study showed right ureteral stent in position, persistent calculi in right ureter, pelvis and proximal mid left ureter.  Surgery team evaluated the patient in the ED, advised HIDA scan which ruled out acute cholecystitis. Entresto, lasix and spironolactone held due to low BP. To follow up with [facility] urology early January 2025.  Has had 2 ED visits 01/25 due to pain from his kidney stone  Echo 04/30/23: EF 30-35% along with mild LVH and Grade II DD and moderate Ronnie.  Echo 10/13/23: EF 25-30% with moderate LAE, moderate Ronnie  LHC 05/01/23:   Prox LAD to Mid LAD lesion is 99% stenosed.   Mid LAD lesion is 60% stenosed.   1st Diag lesion is 70% stenosed.   Prox RCA lesion is 95% stenosed.   Mid RCA lesion is 30% stenosed.   RPDA lesion is 60% stenosed.   Dist LAD lesion is 99% stenosed.   A drug-eluting stent was successfully placed using a STENT ONYX FRONTIER 4.0X15.   Balloon angioplasty was performed using a BALLN [place] EUPHORA RX 2.75X20.   Post intervention, there is a 20% residual stenosis.   Post intervention, there is a 0% residual stenosis.   There is severe left ventricular systolic dysfunction.   LV end diastolic pressure is severely elevated.   There is moderate (3+) mitral regurgitation.  1.  Severe two-vessel coronary artery disease with subtotal occlusion of the proximal LAD, diffuse moderate mid LAD disease and  severe distal LAD disease.  In addition, there is 95% in the proximal right coronary artery with faint right to left collaterals to the LAD.  The left circumflex has mild nonobstructive disease and OM 3 is large and reaches the apex. 2.  Severely reduced LV systolic function with an EF of 25%.  Severely elevated left  ventricular end-diastolic pressure at 34 mmHg. 3.  Successful balloon angioplasty to the proximal LAD and drug-eluting stent placement to the proximal right coronary artery.  He presents today for a HF f/u visit with a chief complaint of right sided flank/ abdominal pain. This has been present for many months and he has now run out of pain medication as well as nausea medication. He has placed a call into urology regarding this and is still waiting for a return call. Currently scheduled for PCNL on 01/22/24 and he voices frustration with how long this process has taken. Has associated nausea, fatigue, SOB, cough and decreased appetite along with this. Denies chest pain, palpitations, abdominal distention, pedal edema, dizziness or weight gain.   Since last visit, he took 40mg  furosemide for 3 days due to worsening symptoms and elevated BNP. After the 3 days, he resumed his normal 20mg  dose. Says that he's felt fine from a HF standpoint since then. Spironolactone continues to be on hold.   ROS: All systems negative except as listed in HPI, PMH and Problem List.  SH:  Social History   Socioeconomic History   Marital status: Divorced    Spouse name: Not on file   Number of children: Not on file   Years of education: Not on file   Highest education level: 10th grade  Occupational History   Not on file  Tobacco Use   Smoking status: Never    Passive exposure: Never   Smokeless tobacco: Never  Vaping Use   Vaping status: Never Used  Substance and Sexual Activity   Alcohol use: Never   Drug use: Yes    Types: Marijuana   Sexual activity: Not on file  Other Topics Concern   Not on file  Social History Narrative   Not on file   Social Drivers of Health   Financial Resource Strain: Low Risk  (01/23/2024)   Received from [facility]   Overall Financial Resource Strain (CARDIA)    Difficulty of Paying Living Expenses: Not hard at all  Food Insecurity: No Food Insecurity (01/23/2024)    Received from [facility]   Hunger Vital Sign    Worried About Running Out of Food in the Last Year: Never true    Ran Out of Food in the Last Year: Never true  Transportation Needs: No Transportation Needs (01/23/2024)   Received from [facility]   PRAPARE - Transportation    Lack of Transportation (Medical): No    Lack of Transportation (Non-Medical): No  Physical Activity: Not on file  Stress: Not on file  Social Connections: Not on file  Intimate Partner Violence: Not At Risk (11/05/2023)   Humiliation, Afraid, Rape, and Kick questionnaire    Fear of Current or Ex-Partner: No    Emotionally Abused: No    Physically Abused: No    Sexually Abused: No    FH:  Family History  Problem Relation Age of Onset   Heart disease Father     Past Medical History:  Diagnosis Date   Coronary artery disease 05/01/2023   PCI/DES   HFrEF (heart failure with reduced ejection fraction) (HCC)  05/01/2023   Hiatal hernia    HLD (hyperlipidemia)    HTN (hypertension)    Ischemic cardiomyopathy 05/01/2023   LVEF 30-35%   Kidney stones     Current Outpatient Medications  Medication Sig Dispense Refill   albuterol (VENTOLIN HFA) 108 (90 Base) MCG/ACT inhaler Inhale 2 puffs into the lungs every 6 (six) hours as needed for wheezing or shortness of breath. 17 each 0   aspirin EC 81 MG tablet Take 81 mg by mouth daily. Swallow whole.     atorvastatin (LIPITOR) 40 MG tablet Take 1 tablet (40 mg total) by mouth daily. 90 tablet 3   furosemide (LASIX) 20 MG tablet Take 1 tablet (20 mg total) by mouth daily. 90 tablet 3   metoprolol succinate (TOPROL-XL) 25 MG 24 hr tablet Take 1 tablet (25 mg total) by mouth daily. 90 tablet 3   ondansetron (ZOFRAN-ODT) 4 MG disintegrating tablet Take 1 tablet (4 mg total) by mouth every 6 (six) hours as needed for nausea or vomiting. 20 tablet 0   oxyCODONE HCl 10 MG TABA Take 10 mg by mouth every 6 (six) hours as needed (pain).     polyethylene glycol  (MIRALAX / GLYCOLAX) 17 g packet Take 17 g by mouth daily. 30 each 0   sacubitril-valsartan (ENTRESTO) 24-26 MG Take 1 tablet by mouth 2 (two) times daily. 180 tablet 2   solifenacin (VESICARE) 10 MG tablet Take 10 mg by mouth daily.     [Paused] spironolactone (ALDACTONE) 25 MG tablet Take 1 tablet (25 mg total) by mouth daily. (Patient not taking: Reported on 01/04/2024) 45 tablet 3   tamsulosin (FLOMAX) 0.4 MG CAPS capsule Take 1 capsule (0.4 mg total) by mouth daily after supper. 30 capsule 2   traZODone (DESYREL) 50 MG tablet Take 0.5-1 tablets (25-50 mg total) by mouth at bedtime as needed for sleep. 90 tablet 0   No current facility-administered medications for this visit.   There were no vitals filed for this visit.  Wt Readings from Last 3 Encounters:  01/04/24 201 lb 9.6 oz (91.4 kg)  12/21/23 199 lb 6.4 oz (90.4 kg)  12/07/23 201 lb (91.2 kg)   Lab Results  Component Value Date   CREATININE 1.65 (H) 12/21/2023   CREATININE 1.33 (H) 12/07/2023   CREATININE 1.41 (H) 12/07/2023   PHYSICAL EXAM:  General:  Appears uncomfortable. No resp difficulty HEENT: normal Neck: supple. JVP flat. Cor: PMI normal. Regular rate & rhythm. No rubs, gallops or murmurs. Lungs: expiratory wheezing in bilateral lower lobes Abdomen: soft, tender lower right side of abdomen, nondistended.  Extremities: no cyanosis, clubbing, rash, edema Neuro: alert & oriented x3. Moves all 4 extremities w/o difficulty. Affect pleasant.   ECG: not done  ReDs reading: 32 %, normal   ASSESSMENT & PLAN:  1: Ischemic heart failure with reduced ejection fraction- - NSTEMI with PCI 06/24 - NYHA class II - euvolemic - weighing daily; reminded to call for an overnight weight gain of > 2 pounds or a weekly weight gain of > 5 pounds - weight down 4 pounds from last visit here 1 month ago - ReDs reading 32%; no change in diuretic therapy - Echo 04/30/23: EF 30-35% along with mild LVH and Grade II DD and moderate  Ronnie.  - Echo 10/13/23: EF 25-30% with moderate LAE, moderate Ronnie - continue furosemide 20mg  daily - continue metoprolol succinate 25mg  daily - continue entresto 24/26mg  BID - MRA still on hold but will consider adding at next  visit if labs/ BP maintains - due to chronic kidney stone; may not be a good candidate for SGLT2 - BNP 12/07/23 was 1051.7 - pro-BNP today  2: CAD- - was  NS with cardiology Ronnie Mann) 08/24; this needs R/S but his priority is urology issues right now - continue atorvastatin 40mg  daily - continue ASA 81mg  daily - LHC 05/01/23:   Prox LAD to Mid LAD lesion is 99% stenosed.   Mid LAD lesion is 60% stenosed.   1st Diag lesion is 70% stenosed.   Prox RCA lesion is 95% stenosed.   Mid RCA lesion is 30% stenosed.   RPDA lesion is 60% stenosed.   Dist LAD lesion is 99% stenosed.   A drug-eluting stent was successfully placed using a STENT ONYX FRONTIER 4.0X15.   Balloon angioplasty was performed using a BALLN [place] EUPHORA RX 2.75X20.   Post intervention, there is a 20% residual stenosis.   Post intervention, there is a 0% residual stenosis.   There is severe left ventricular systolic dysfunction.   LV end diastolic pressure is severely elevated.   There is moderate (3+) mitral regurgitation.  1.  Severe two-vessel coronary artery disease with subtotal occlusion of the proximal LAD, diffuse moderate mid LAD disease and severe distal LAD disease.  In addition, there is 95% in the proximal right coronary artery with faint right to left collaterals to the LAD.  The left circumflex has mild nonobstructive disease and OM 3 is large and reaches the apex. 2.  Severely reduced LV systolic function with an EF of 25%.  Severely elevated left ventricular end-diastolic pressure at 34 mmHg. 3.  Successful balloon angioplasty to the proximal LAD and drug-eluting stent placement to the proximal right coronary artery.  3: Kidney stones (managed by urology)- - followed by Ronnie Mann urology (last  seen 01/25) - has bilateral staghorn renal stones  - right ureteral stent placed 10/12/23  - PCNL scheduled for 03/25 - he is waiting on a return call back regarding refilling his pain/ nausea medications  4: HTN- - BP 126/83 - saw PCP Ronnie Mann) 01/25 - BMP 12/07/23 showed sodium 137, potassium 4.8, creatinine 1.33 & GFR 59 - BMET today   Return in 2 weeks to see pharmacy, sooner if needed.   Delma Freeze, FNP 01/29/24

## 2024-01-29 NOTE — Telephone Encounter (Signed)
 Unable to confirm appt on 01/30/24 pt vm box not setup

## 2024-01-30 ENCOUNTER — Emergency Department

## 2024-01-30 ENCOUNTER — Telehealth: Payer: Self-pay

## 2024-01-30 ENCOUNTER — Other Ambulatory Visit: Payer: Self-pay

## 2024-01-30 ENCOUNTER — Inpatient Hospital Stay
Admission: EM | Admit: 2024-01-30 | Discharge: 2024-02-01 | DRG: 303 | Disposition: A | Discharge status: Home Health | Attending: Obstetrics and Gynecology | Admitting: Obstetrics and Gynecology

## 2024-01-30 ENCOUNTER — Encounter: Payer: Medicare HMO | Admitting: Family

## 2024-01-30 DIAGNOSIS — D631 Anemia in chronic kidney disease: Secondary | ICD-10-CM | POA: Diagnosis present

## 2024-01-30 DIAGNOSIS — N1831 Chronic kidney disease, stage 3a: Secondary | ICD-10-CM | POA: Insufficient documentation

## 2024-01-30 DIAGNOSIS — I200 Unstable angina: Secondary | ICD-10-CM | POA: Diagnosis not present

## 2024-01-30 DIAGNOSIS — I255 Ischemic cardiomyopathy: Secondary | ICD-10-CM | POA: Diagnosis present

## 2024-01-30 DIAGNOSIS — I25110 Atherosclerotic heart disease of native coronary artery with unstable angina pectoris: Secondary | ICD-10-CM | POA: Diagnosis present

## 2024-01-30 DIAGNOSIS — Y838 Other surgical procedures as the cause of abnormal reaction of the patient, or of later complication, without mention of misadventure at the time of the procedure: Secondary | ICD-10-CM | POA: Diagnosis present

## 2024-01-30 DIAGNOSIS — Z79899 Other long term (current) drug therapy: Secondary | ICD-10-CM

## 2024-01-30 DIAGNOSIS — I214 Non-ST elevation (NSTEMI) myocardial infarction: Secondary | ICD-10-CM | POA: Diagnosis present

## 2024-01-30 DIAGNOSIS — I452 Bifascicular block: Secondary | ICD-10-CM | POA: Diagnosis present

## 2024-01-30 DIAGNOSIS — Z888 Allergy status to other drugs, medicaments and biological substances status: Secondary | ICD-10-CM

## 2024-01-30 DIAGNOSIS — N200 Calculus of kidney: Secondary | ICD-10-CM | POA: Diagnosis present

## 2024-01-30 DIAGNOSIS — I2699 Other pulmonary embolism without acute cor pulmonale: Secondary | ICD-10-CM

## 2024-01-30 DIAGNOSIS — I130 Hypertensive heart and chronic kidney disease with heart failure and stage 1 through stage 4 chronic kidney disease, or unspecified chronic kidney disease: Secondary | ICD-10-CM | POA: Diagnosis present

## 2024-01-30 DIAGNOSIS — Z7982 Long term (current) use of aspirin: Secondary | ICD-10-CM | POA: Diagnosis not present

## 2024-01-30 DIAGNOSIS — E872 Acidosis, unspecified: Secondary | ICD-10-CM | POA: Diagnosis present

## 2024-01-30 DIAGNOSIS — I2694 Multiple subsegmental pulmonary emboli without acute cor pulmonale: Secondary | ICD-10-CM | POA: Diagnosis present

## 2024-01-30 DIAGNOSIS — R079 Chest pain, unspecified: Secondary | ICD-10-CM | POA: Diagnosis not present

## 2024-01-30 DIAGNOSIS — G8929 Other chronic pain: Secondary | ICD-10-CM | POA: Diagnosis present

## 2024-01-30 DIAGNOSIS — N1832 Chronic kidney disease, stage 3b: Secondary | ICD-10-CM | POA: Diagnosis present

## 2024-01-30 DIAGNOSIS — E785 Hyperlipidemia, unspecified: Secondary | ICD-10-CM | POA: Diagnosis present

## 2024-01-30 DIAGNOSIS — I5042 Chronic combined systolic (congestive) and diastolic (congestive) heart failure: Secondary | ICD-10-CM | POA: Diagnosis present

## 2024-01-30 DIAGNOSIS — I25118 Atherosclerotic heart disease of native coronary artery with other forms of angina pectoris: Secondary | ICD-10-CM | POA: Diagnosis not present

## 2024-01-30 DIAGNOSIS — Z8249 Family history of ischemic heart disease and other diseases of the circulatory system: Secondary | ICD-10-CM

## 2024-01-30 DIAGNOSIS — T81718A Complication of other artery following a procedure, not elsewhere classified, initial encounter: Secondary | ICD-10-CM | POA: Diagnosis present

## 2024-01-30 DIAGNOSIS — Z9861 Coronary angioplasty status: Secondary | ICD-10-CM

## 2024-01-30 DIAGNOSIS — I959 Hypotension, unspecified: Secondary | ICD-10-CM | POA: Diagnosis present

## 2024-01-30 DIAGNOSIS — I5022 Chronic systolic (congestive) heart failure: Secondary | ICD-10-CM | POA: Diagnosis present

## 2024-01-30 DIAGNOSIS — D649 Anemia, unspecified: Secondary | ICD-10-CM | POA: Diagnosis not present

## 2024-01-30 DIAGNOSIS — I259 Chronic ischemic heart disease, unspecified: Secondary | ICD-10-CM | POA: Diagnosis present

## 2024-01-30 DIAGNOSIS — Z87448 Personal history of other diseases of urinary system: Secondary | ICD-10-CM

## 2024-01-30 HISTORY — DX: Pneumothorax, unspecified: J93.9

## 2024-01-30 LAB — CBC
HCT: 31 % — ABNORMAL LOW (ref 39.0–52.0)
Hemoglobin: 10.1 g/dL — ABNORMAL LOW (ref 13.0–17.0)
MCH: 31.2 pg (ref 26.0–34.0)
MCHC: 32.6 g/dL (ref 30.0–36.0)
MCV: 95.7 fL (ref 80.0–100.0)
Platelets: 214 10*3/uL (ref 150–400)
RBC: 3.24 MIL/uL — ABNORMAL LOW (ref 4.22–5.81)
RDW: 15 % (ref 11.5–15.5)
WBC: 6 10*3/uL (ref 4.0–10.5)
nRBC: 0 % (ref 0.0–0.2)

## 2024-01-30 LAB — PROTIME-INR
INR: 1 (ref 0.8–1.2)
Prothrombin Time: 13.7 s (ref 11.4–15.2)

## 2024-01-30 LAB — BASIC METABOLIC PANEL
Anion gap: 8 (ref 5–15)
BUN: 46 mg/dL — ABNORMAL HIGH (ref 8–23)
CO2: 20 mmol/L — ABNORMAL LOW (ref 22–32)
Calcium: 9.3 mg/dL (ref 8.9–10.3)
Chloride: 107 mmol/L (ref 98–111)
Creatinine, Ser: 1.64 mg/dL — ABNORMAL HIGH (ref 0.61–1.24)
GFR, Estimated: 46 mL/min — ABNORMAL LOW (ref >60)
Glucose, Bld: 194 mg/dL — ABNORMAL HIGH (ref 70–99)
Potassium: 4.4 mmol/L (ref 3.5–5.1)
Sodium: 135 mmol/L (ref 135–145)

## 2024-01-30 LAB — URINALYSIS, ROUTINE W REFLEX MICROSCOPIC
Bacteria, UA: NONE SEEN
Bilirubin Urine: NEGATIVE
Glucose, UA: NEGATIVE mg/dL
Ketones, ur: NEGATIVE mg/dL
Nitrite: POSITIVE — AB
Protein, ur: 30 mg/dL — AB
RBC / HPF: >50 RBC/hpf (ref 0–5)
Specific Gravity, Urine: 1.01 (ref 1.005–1.030)
WBC, UA: >50 WBC/hpf (ref 0–5)
pH: 5 (ref 5.0–8.0)

## 2024-01-30 LAB — TROPONIN I (HIGH SENSITIVITY)
Troponin I (High Sensitivity): 91 ng/L — ABNORMAL HIGH (ref <18)
Troponin I (High Sensitivity): 86 ng/L — ABNORMAL HIGH (ref <18)

## 2024-01-30 LAB — HEPARIN LEVEL (UNFRACTIONATED): Heparin Unfractionated: 0.43 [IU]/mL (ref 0.30–0.70)

## 2024-01-30 LAB — APTT: aPTT: 28 s (ref 24–36)

## 2024-01-30 LAB — BRAIN NATRIURETIC PEPTIDE: B Natriuretic Peptide: 298.2 pg/mL — ABNORMAL HIGH (ref 0.0–100.0)

## 2024-01-30 MED ORDER — ALBUTEROL SULFATE (2.5 MG/3ML) 0.083% IN NEBU: 2.5000 mL | INHALATION_SOLUTION | Freq: Four times a day (QID) | RESPIRATORY_TRACT | Status: DC | PRN

## 2024-01-30 MED ORDER — TRAZODONE HCL 50 MG PO TABS
25.0000 mg | ORAL_TABLET | Freq: Every evening | ORAL | Status: DC | PRN
  Administered 2024-01-30 – 2024-01-31 (×2): 50 mg via ORAL
  Filled 2024-01-30 (×2): qty 1

## 2024-01-30 MED ORDER — ASPIRIN 81 MG PO TBEC
81.0000 mg | DELAYED_RELEASE_TABLET | Freq: Every day | ORAL | Status: DC
  Administered 2024-01-31 – 2024-02-01 (×2): 81 mg via ORAL
  Filled 2024-01-30 (×2): qty 1

## 2024-01-30 MED ORDER — TAMSULOSIN HCL 0.4 MG PO CAPS
0.4000 mg | ORAL_CAPSULE | Freq: Every day | ORAL | Status: DC
  Administered 2024-01-30 – 2024-01-31 (×2): 0.4 mg via ORAL
  Filled 2024-01-30 (×2): qty 1

## 2024-01-30 MED ORDER — ONDANSETRON HCL 4 MG/2ML IJ SOLN
4.0000 mg | Freq: Once | INTRAMUSCULAR | Status: AC
  Administered 2024-01-30: 4 mg via INTRAVENOUS
  Filled 2024-01-30: qty 2

## 2024-01-30 MED ORDER — MORPHINE SULFATE (PF) 2 MG/ML IV SOLN
2.0000 mg | INTRAVENOUS | Status: DC | PRN
  Administered 2024-01-30 – 2024-02-01 (×6): 2 mg via INTRAVENOUS
  Filled 2024-01-30 (×6): qty 1

## 2024-01-30 MED ORDER — ENOXAPARIN SODIUM 40 MG/0.4ML IJ SOSY: 40.0000 mg | PREFILLED_SYRINGE | INTRAMUSCULAR | Status: DC

## 2024-01-30 MED ORDER — ASPIRIN 81 MG PO CHEW
324.0000 mg | CHEWABLE_TABLET | Freq: Once | ORAL | Status: AC
Start: 2024-01-30 — End: 2024-01-30
  Administered 2024-01-30: 324 mg via ORAL
  Filled 2024-01-30: qty 4

## 2024-01-30 MED ORDER — FENTANYL CITRATE PF 50 MCG/ML IJ SOSY
50.0000 ug | PREFILLED_SYRINGE | Freq: Once | INTRAMUSCULAR | Status: AC
  Administered 2024-01-30: 50 ug via INTRAVENOUS
  Filled 2024-01-30: qty 1

## 2024-01-30 MED ORDER — FUROSEMIDE 10 MG/ML IJ SOLN
40.0000 mg | Freq: Once | INTRAMUSCULAR | Status: AC
  Administered 2024-01-30: 40 mg via INTRAVENOUS
  Filled 2024-01-30: qty 4

## 2024-01-30 MED ORDER — HEPARIN (PORCINE) 25000 UT/250ML-% IV SOLN
1150.0000 [IU]/h | INTRAVENOUS | Status: DC
  Administered 2024-01-30: 1150 [IU]/h via INTRAVENOUS
  Filled 2024-01-30: qty 250

## 2024-01-30 MED ORDER — METOPROLOL SUCCINATE ER 25 MG PO TB24
25.0000 mg | ORAL_TABLET | Freq: Every day | ORAL | Status: DC
  Administered 2024-01-31: 25 mg via ORAL
  Filled 2024-01-30: qty 1

## 2024-01-30 MED ORDER — HEPARIN BOLUS VIA INFUSION
4000.0000 [IU] | Freq: Once | INTRAVENOUS | Status: AC
  Administered 2024-01-30: 4000 [IU] via INTRAVENOUS
  Filled 2024-01-30: qty 4000

## 2024-01-30 MED ORDER — ATORVASTATIN CALCIUM 20 MG PO TABS
40.0000 mg | ORAL_TABLET | Freq: Every day | ORAL | Status: DC
  Administered 2024-01-31 – 2024-02-01 (×2): 40 mg via ORAL
  Filled 2024-01-30 (×2): qty 2

## 2024-01-30 MED ORDER — POLYETHYLENE GLYCOL 3350 17 G PO PACK
17.0000 g | PACK | Freq: Every day | ORAL | Status: DC
  Administered 2024-01-30 – 2024-02-01 (×3): 17 g via ORAL
  Filled 2024-01-30 (×3): qty 1

## 2024-01-30 MED ORDER — SODIUM CHLORIDE 0.9 % IV BOLUS
500.0000 mL | Freq: Once | INTRAVENOUS | Status: AC
  Administered 2024-01-30: 500 mL via INTRAVENOUS

## 2024-01-30 MED ORDER — OXYCODONE HCL 5 MG PO TABS
10.0000 mg | ORAL_TABLET | Freq: Four times a day (QID) | ORAL | Status: DC | PRN
  Administered 2024-01-30 – 2024-02-01 (×6): 10 mg via ORAL
  Filled 2024-01-30 (×6): qty 2

## 2024-01-30 NOTE — ED Triage Notes (Signed)
 Patient states left sided chest pain that radiates down left arm and shortness of breath; reports it started this morning. Patient also reports that he was just discharged from [facility] Saturday for a right sided Pneumo.

## 2024-01-30 NOTE — ED Notes (Signed)
 CCMD was contacted

## 2024-01-30 NOTE — ED Notes (Signed)
 First Nurse Note: Pt to ED via [facility] from home for chest pain that is worse with breathing, left arm pain, right flank pain, Pain in the incision on his right side. Pt states that when he was having kidney stone procedure, they nicked his right lung. Pts VSS with EMS. 12 lead showed RBBB. Pt did take 81 mg of Aspirin prior to EMS arrival.

## 2024-01-30 NOTE — Telephone Encounter (Signed)
 Pt son unable to get off work in time to take pt to appointment. Appt rescheduled.

## 2024-01-30 NOTE — Consult Note (Signed)
 PHARMACY - ANTICOAGULATION CONSULT NOTE  Pharmacy Consult for Heparin  Indication: chest pain/ACS  Allergies  Allergen Reactions   Losartan Other (See Comments)    Chest pain    Patient Measurements: Height: 5\' 9"  (175.3 cm) Weight: 84.4 kg (186 lb) IBW/kg (Calculated) : 70.7 Heparin Dosing Weight: 84.4 kg  Vital Signs: Temp: 97.3 F (36.3 C) (03/11 1344) Temp Source: Oral (03/11 1344) BP: 97/53 (03/11 1505) Pulse Rate: 66 (03/11 1505)  Labs: Recent Labs    01/30/24 1354 01/30/24 1532  HGB 10.1*  --   HCT 31.0*  --   PLT 214  --   CREATININE 1.64*  --   TROPONINIHS 91* 86*    Estimated Creatinine Clearance: 43.7 mL/min (A) (by C-G formula based on SCr of 1.64 mg/dL (H)).   Medical History: Past Medical History:  Diagnosis Date   Coronary artery disease 05/01/2023   PCI/DES   HFrEF (heart failure with reduced ejection fraction) (HCC) 05/01/2023   Hiatal hernia    HLD (hyperlipidemia)    HTN (hypertension)    Ischemic cardiomyopathy 05/01/2023   LVEF 30-35%   Kidney stones    Pneumothorax     Medications:  (Not in a hospital admission)  Scheduled:   aspirin EC  81 mg Oral Daily   atorvastatin  40 mg Oral Daily   enoxaparin (LOVENOX) injection  40 mg Subcutaneous Q24H   furosemide  40 mg Intravenous Once   metoprolol succinate  25 mg Oral Daily   polyethylene glycol  17 g Oral Daily   tamsulosin  0.4 mg Oral QPC supper   Infusions:  PRN: albuterol, oxyCODONE HCl, traZODone  Assessment: Patient presented with chest pain and worse with breathing. PMH possibly includes CHF, HTN, CAD, CKD.  Trop 86. Pharmacy consulted to start heparin. No DOAC PTA. CBC stable.   Goal of Therapy:  Heparin level 0.3-0.7 units/ml Monitor platelets by anticoagulation protocol: Yes   Plan:  Give 4000 units bolus x 1 Start heparin infusion at 1150 units/hr Check anti-Xa level in 6 hours and daily while on heparin Continue to monitor H&H and platelets  Ronnald Ramp, PharmD, BCPS 01/30/2024,4:49 PM

## 2024-01-30 NOTE — H&P (Signed)
 History and Physical    Patient: Ronnie Mann ZOX:[id number] DOB: 1956/07/19 DOA: 01/30/2024 DOS: the patient was seen and examined on 01/30/2024 PCP: Margarita Mail, DO  Patient coming from: Home  Chief Complaint:  Chief Complaint  Patient presents with   Chest Pain   HPI: Ronnie Mann is a 68 y.o. male with medical history significant of coronary artery disease, chronic systolic congestive heart failure, essential hypertension, dyslipidemia, who came to the hospital with complaints of sudden onset of chest pain.  Patient had a cardiac angiogram a year ago, had a high-grade coronary occlusion in LAD and RCA.  Balloon angioplasty was performed to proximal LAD and a drug-eluting stent placement into proximal RCA.  Echocardiogram performed that time showed ejection fraction 30 to 35%. Patient was admitted to [facility] and discharged last Friday with pneumothorax.  Since that time, patient has been having some right-sided chest pain with deep breath.  Patient had a sudden onset of chest pain localized in the left side chest radiate to the left arm-8:30 AM yesterday.  He took some nitroglycerin, and slept.  He had another episode of chest pain today at 9:30 AM, was described as a sharp, persistent.  10/10 in severity.  Radiate to the left arm.  The pain never got better before he came to the hospital, he still has some moderate pain now.  Patient also complaining short of breath which has been worse.  Patient has been sleeping in his recliner for many years, he has short of breath with minimal exertion at baseline.  He describes worsening short of breath for the last 3 days.  He also had significant paroxysmal nocturnal dyspnea even he sleeps in the recliner.  He did not have any leg edema, he lost weight.  Upon arriving the hospital, lab work showed troponin of 91, dropped down to 6, creatinine 1.64.  WBC 6.0, hemoglobin 10.1.  Chest x-ray did not show any active disease.  Patient was  admitted to the hospital for chest pain workup. Review of Systems: As mentioned in the history of present illness. All other systems reviewed and are negative. Past Medical History:  Diagnosis Date   Coronary artery disease 05/01/2023   PCI/DES   HFrEF (heart failure with reduced ejection fraction) (HCC) 05/01/2023   Hiatal hernia    HLD (hyperlipidemia)    HTN (hypertension)    Ischemic cardiomyopathy 05/01/2023   LVEF 30-35%   Kidney stones    Pneumothorax    Past Surgical History:  Procedure Laterality Date   CORONARY STENT INTERVENTION N/A 05/01/2023   Procedure: CORONARY STENT INTERVENTION;  Surgeon: Iran Ouch, MD;  Location: [facility] INVASIVE CV LAB;  Service: Cardiovascular;  Laterality: N/A;   CYSTOSCOPY W/ URETERAL STENT PLACEMENT Right 10/12/2023   Procedure: CYSTOSCOPY WITH STENT REPLACEMENT;  Surgeon: Riki Altes, MD;  Location: [facility] ORS;  Service: Urology;  Laterality: Right;   LEFT HEART CATH AND CORONARY ANGIOGRAPHY N/A 05/01/2023   Procedure: LEFT HEART CATH AND CORONARY ANGIOGRAPHY;  Surgeon: Iran Ouch, MD;  Location: [facility] INVASIVE CV LAB;  Service: Cardiovascular;  Laterality: N/A;   Social History:  reports that he has never smoked. He has never been exposed to tobacco smoke. He has never used smokeless tobacco. He reports current drug use. Drug: Marijuana. He reports that he does not drink alcohol.  Allergies  Allergen Reactions   Losartan Other (See Comments)    Chest pain    Family History  Problem Relation Age of Onset  Heart disease Father     Prior to Admission medications   Medication Sig Start Date End Date Taking? Authorizing Provider  albuterol (VENTOLIN HFA) 108 (90 Base) MCG/ACT inhaler Inhale 2 puffs into the lungs every 6 (six) hours as needed for wheezing or shortness of breath. 10/14/23   Alford Highland, MD  aspirin EC 81 MG tablet Take 81 mg by mouth daily. Swallow whole.    [provider]  atorvastatin (LIPITOR)  40 MG tablet Take 1 tablet (40 mg total) by mouth daily. 01/08/24 02/07/24  Delma Freeze, FNP  furosemide (LASIX) 20 MG tablet Take 1 tablet (20 mg total) by mouth daily. 01/08/24   Delma Freeze, FNP  metoprolol succinate (TOPROL-XL) 25 MG 24 hr tablet Take 1 tablet (25 mg total) by mouth daily. 01/08/24   Delma Freeze, FNP  ondansetron (ZOFRAN-ODT) 4 MG disintegrating tablet Take 1 tablet (4 mg total) by mouth every 6 (six) hours as needed for nausea or vomiting. 12/03/23   Ward, Layla Maw, DO  oxyCODONE HCl 10 MG TABA Take 10 mg by mouth every 6 (six) hours as needed (pain).    [provider]  polyethylene glycol (MIRALAX / GLYCOLAX) 17 g packet Take 17 g by mouth daily. 10/14/23   Alford Highland, MD  sacubitril-valsartan (ENTRESTO) 24-26 MG Take 1 tablet by mouth 2 (two) times daily. 01/24/24   Laurey Morale, MD  solifenacin (VESICARE) 10 MG tablet Take 10 mg by mouth daily.    [provider]  spironolactone (ALDACTONE) 25 MG tablet Take 1 tablet (25 mg total) by mouth daily. Patient not taking: Reported on 01/04/2024 08/30/23 12/18/23  Sabharwal, Eliezer Lofts, DO  tamsulosin (FLOMAX) 0.4 MG CAPS capsule Take 1 capsule (0.4 mg total) by mouth daily after supper. 11/06/23   Darlin Priestly, MD  traZODone (DESYREL) 50 MG tablet Take 0.5-1 tablets (25-50 mg total) by mouth at bedtime as needed for sleep. 11/27/23   Margarita Mail, DO    Physical Exam: Vitals:   01/30/24 1344 01/30/24 1351 01/30/24 1354 01/30/24 1505  BP: (!) 89/61  93/61 (!) 97/53  Pulse: 81   66  Resp: 18   (!) 21  Temp: (!) 97.3 F (36.3 C)     TempSrc: Oral     SpO2: 98%   100%  Weight:  84.4 kg    Height:  5\' 9"  (1.753 m)     Physical Exam Constitutional:      General: He is not in acute distress.    Appearance: He is normal weight. He is not ill-appearing or toxic-appearing.  HENT:     Head: Normocephalic and atraumatic.  Eyes:     Extraocular Movements: Extraocular movements intact.     Pupils:  Pupils are equal, round, and reactive to light.  Neck:     Thyroid: No thyromegaly.     Vascular: No hepatojugular reflux.     Trachea: No tracheal deviation.  Cardiovascular:     Rate and Rhythm: Normal rate and regular rhythm.     Heart sounds: No murmur heard. Pulmonary:     Breath sounds: Decreased breath sounds present.  Abdominal:     General: Bowel sounds are normal. There is no abdominal bruit.     Palpations: Abdomen is soft. There is no hepatomegaly, splenomegaly or mass.     Tenderness: There is no abdominal tenderness.  Musculoskeletal:        General: Normal range of motion.     Cervical back: Normal  range of motion.     Right lower leg: No edema.     Left lower leg: No edema.  Skin:    General: Skin is warm.     Coloration: Skin is not cyanotic.  Neurological:     General: No focal deficit present.     Mental Status: He is alert and oriented to person, place, and time.  Psychiatric:        Mood and Affect: Mood normal.        Behavior: Behavior normal.     Data Reviewed:  As above.  Assessment and Plan: Chest pain. Possibility of unstable angina. Patient has known coronary disease, following with with recent intervention a year ago.  Chest pain most likely is cardiac related.  Cardiology consult is pending, can be seen tomorrow. Another consideration is PE.  Due to renal dysfunction, will obtain a VQ scan. For now, patient will be treated with heparin drip until diagnosis is established.  Recent pneumothorax Repeat chest x-ray did not show any pneumothorax.  Chronic combined systolic and diastolic congestive heart failure. Possibility of acute on chronic congestive heart failure. Patient does increase shortness of breath.  I personally reviewed patient chest x-ray image, does not have significant vascular congestion.  BNP will be checked again.  I will give a dose of IV Lasix, will follow to decide if further doses are needed.  Monitor electrolytes renal  function.  Chronic kidney disease stage IIIa. Mild metabolic acidosis. Continue to follow.  Essential hypertension. Continue some blood pressure medicines.    Advance Care Planning:   Code Status: Full Code discussed CODE STATUS, patient wishes to be full code.  Consults: Cardiology  Family Communication: None  Severity of Illness: The appropriate patient status for this patient is INPATIENT. Inpatient status is judged to be reasonable and necessary in order to provide the required intensity of service to ensure the patient's safety. The patient's presenting symptoms, physical exam findings, and initial radiographic and laboratory data in the context of their chronic comorbidities is felt to place them at high risk for further clinical deterioration. Furthermore, it is not anticipated that the patient will be medically stable for discharge from the hospital within 2 midnights of admission.   * I certify that at the point of admission it is my clinical judgment that the patient will require inpatient hospital care spanning beyond 2 midnights from the point of admission due to high intensity of service, high risk for further deterioration and high frequency of surveillance required.*  Author: Marrion Coy, MD 01/30/2024 4:36 PM  For on call review [email or web address].

## 2024-01-30 NOTE — ED Provider Notes (Signed)
 [facility] Provider Note    Event Date/Time   First MD Initiated Contact with Patient 01/30/24 1501     (approximate)   History   Chest Pain   HPI Ronnie Mann is a 68 y.o. male who comes in with left-sided chest pain that radiates down his arm and shortness of breath.  Reported that it started this morning.  Patient was just discharged her right sided pneumo.  Patient reports that was at [facility] he had a procedure done to break up the kidney stones that he developed a pneumothorax that required a catheter on the right side.  He reports over the past 2 days having chest pain on the left side of his chest.  He states that he was given nitro when he took it yesterday and his symptoms got better but then the chest pain came back today.  He denies having this previously.  He reports chronic pain with the stent in place from his kidney stones and stated that he ran out of pain medications in regards to that.  I reviewed the notes from [facility] where patient had troponins in the 700s and called GI was consulted and they did not do a catheterization even with troponins peaking up to 2000 and possible EKG changes.   Physical Exam   Triage Vital Signs: ED Triage Vitals  Encounter Vitals Group     BP 01/30/24 1344 (!) 89/61     Systolic BP Percentile --      Diastolic BP Percentile --      Pulse Rate 01/30/24 1344 81     Resp 01/30/24 1344 18     Temp 01/30/24 1344 (!) 97.3 F (36.3 C)     Temp Source 01/30/24 1344 Oral     SpO2 01/30/24 1344 98 %     Weight 01/30/24 1351 186 lb (84.4 kg)     Height 01/30/24 1351 5\' 9"  (1.753 m)     Head Circumference --      Peak Flow --      Pain Score 01/30/24 1351 10     Pain Loc --      Pain Education --      Exclude from Growth Chart --     Most recent vital signs: Vitals:   01/30/24 1344 01/30/24 1354  BP: (!) 89/61 93/61  Pulse: 81   Resp: 18   Temp: (!) 97.3 F (36.3 C)   SpO2: 98%      General: Awake, no  distress.  CV:  Good peripheral perfusion.  Resp:  Normal effort.  Abd:  No distention.  Soft nontender Other:  Stent strings in place. Old bandage where a right sided chest tube was previously   ED Results / Procedures / Treatments   Labs (all labs ordered are listed, but only abnormal results are displayed) Labs Reviewed  BASIC METABOLIC PANEL - Abnormal; Notable for the following components:      Result Value   CO2 20 (*)    Glucose, Bld 194 (*)    BUN 46 (*)    Creatinine, Ser 1.64 (*)    GFR, Estimated 46 (*)    All other components within normal limits  CBC - Abnormal; Notable for the following components:   RBC 3.24 (*)    Hemoglobin 10.1 (*)    HCT 31.0 (*)    All other components within normal limits  TROPONIN I (HIGH SENSITIVITY) - Abnormal; Notable for the following components:  Troponin I (High Sensitivity) 91 (*)    All other components within normal limits     EKG  My interpretation of EKG:  Normal sinus rate 78 without any ST elevation, right bundle branch block with T wave version aVL, V2, left anterior fascicular block  RADIOLOGY I have reviewed the xray personally and interpreted and no PTX    PROCEDURES:  Critical Care performed: No  .1-3 Lead EKG Interpretation  Performed by: Concha Se, MD Authorized by: Concha Se, MD     Interpretation: normal     ECG rate:  60   ECG rate assessment: normal     Rhythm: sinus rhythm     Ectopy: none     Conduction: normal      MEDICATIONS ORDERED IN ED: Medications  aspirin EC tablet 81 mg (has no administration in time range)  oxyCODONE HCl TABA 10 mg (has no administration in time range)  atorvastatin (LIPITOR) tablet 40 mg (has no administration in time range)  metoprolol succinate (TOPROL-XL) 24 hr tablet 25 mg (has no administration in time range)  traZODone (DESYREL) tablet 25-50 mg (has no administration in time range)  polyethylene glycol (MIRALAX / GLYCOLAX) packet 17 g (17 g Oral  Given 01/30/24 1649)  tamsulosin (FLOMAX) capsule 0.4 mg (has no administration in time range)  albuterol (PROVENTIL) (2.5 MG/3ML) 0.083% nebulizer solution 2.5 mL (has no administration in time range)  furosemide (LASIX) injection 40 mg (has no administration in time range)  heparin bolus via infusion 4,000 Units (has no administration in time range)  heparin ADULT infusion 100 units/mL (25000 units/256mL) (has no administration in time range)  sodium chloride 0.9 % bolus 500 mL (0 mLs Intravenous Stopped 01/30/24 1542)  fentaNYL (SUBLIMAZE) injection 50 mcg (50 mcg Intravenous Given 01/30/24 1538)  ondansetron (ZOFRAN) injection 4 mg (4 mg Intravenous Given 01/30/24 1538)  aspirin chewable tablet 324 mg (324 mg Oral Given 01/30/24 1648)     IMPRESSION / MDM / ASSESSMENT AND PLAN / ED COURSE  I reviewed the triage vital signs and the nursing notes.   Patient's presentation is most consistent with acute presentation with potential threat to life or bodily function.  Patient comes in with chest pain up higher than from his baselines with previous troponins in the 2000 and at [facility] but no intervention was done.  Patient continued to have chest pain now.  Does not sound typical of dissection.  Considered PE but patient not hypoxic and patient has had chest pain previously with known cardiac disease this seems more likely.  He had CT imaging that was negative for PE back in December.  Troponin is elevated higher than baseline.  BMP shows stable creatinine.  Hemoglobin stable  Chest x-ray without any evidence of recurrent pneumothorax.  Patient given some gentle hydration with his low blood pressures as well as some fentanyl, Zofran.  Patient was admitted to the hospitalist for further workup and management of high risk chest pain  The patient is on the cardiac monitor to evaluate for evidence of arrhythmia and/or significant heart rate changes.      FINAL CLINICAL IMPRESSION(S) / ED DIAGNOSES    Final diagnoses:  Nonspecific chest pain     Rx / DC Orders   ED Discharge Orders     None        Note:  This document was prepared using Dragon voice recognition software and may include unintentional dictation errors.   Concha Se, MD 01/30/24 [phone]

## 2024-01-31 ENCOUNTER — Inpatient Hospital Stay
Admit: 2024-01-31 | Discharge: 2024-01-31 | Disposition: A | Discharge status: Home / Self Care | Attending: Internal Medicine

## 2024-01-31 ENCOUNTER — Ambulatory Visit: Payer: Medicare HMO | Admitting: Internal Medicine

## 2024-01-31 DIAGNOSIS — R079 Chest pain, unspecified: Secondary | ICD-10-CM | POA: Diagnosis not present

## 2024-01-31 DIAGNOSIS — D649 Anemia, unspecified: Secondary | ICD-10-CM | POA: Diagnosis not present

## 2024-01-31 DIAGNOSIS — I2694 Multiple subsegmental pulmonary emboli without acute cor pulmonale: Secondary | ICD-10-CM

## 2024-01-31 DIAGNOSIS — I2699 Other pulmonary embolism without acute cor pulmonale: Secondary | ICD-10-CM

## 2024-01-31 DIAGNOSIS — I255 Ischemic cardiomyopathy: Secondary | ICD-10-CM | POA: Diagnosis not present

## 2024-01-31 DIAGNOSIS — Z87448 Personal history of other diseases of urinary system: Secondary | ICD-10-CM

## 2024-01-31 DIAGNOSIS — N200 Calculus of kidney: Secondary | ICD-10-CM

## 2024-01-31 DIAGNOSIS — I25118 Atherosclerotic heart disease of native coronary artery with other forms of angina pectoris: Secondary | ICD-10-CM

## 2024-01-31 DIAGNOSIS — I200 Unstable angina: Secondary | ICD-10-CM | POA: Diagnosis not present

## 2024-01-31 LAB — BASIC METABOLIC PANEL
Anion gap: 7 (ref 5–15)
BUN: 45 mg/dL — ABNORMAL HIGH (ref 8–23)
CO2: 23 mmol/L (ref 22–32)
Calcium: 8.8 mg/dL — ABNORMAL LOW (ref 8.9–10.3)
Chloride: 106 mmol/L (ref 98–111)
Creatinine, Ser: 1.57 mg/dL — ABNORMAL HIGH (ref 0.61–1.24)
GFR, Estimated: 48 mL/min — ABNORMAL LOW (ref >60)
Glucose, Bld: 141 mg/dL — ABNORMAL HIGH (ref 70–99)
Potassium: 4.2 mmol/L (ref 3.5–5.1)
Sodium: 136 mmol/L (ref 135–145)

## 2024-01-31 LAB — CBC
HCT: 28.1 % — ABNORMAL LOW (ref 39.0–52.0)
Hemoglobin: 9.3 g/dL — ABNORMAL LOW (ref 13.0–17.0)
MCH: 31.2 pg (ref 26.0–34.0)
MCHC: 33.1 g/dL (ref 30.0–36.0)
MCV: 94.3 fL (ref 80.0–100.0)
Platelets: 191 10*3/uL (ref 150–400)
RBC: 2.98 MIL/uL — ABNORMAL LOW (ref 4.22–5.81)
RDW: 15.2 % (ref 11.5–15.5)
WBC: 5.5 10*3/uL (ref 4.0–10.5)
nRBC: 0 % (ref 0.0–0.2)

## 2024-01-31 LAB — HEPARIN LEVEL (UNFRACTIONATED)
Heparin Unfractionated: 0.53 [IU]/mL (ref 0.30–0.70)
Heparin Unfractionated: 0.35 [IU]/mL (ref 0.30–0.70)

## 2024-01-31 LAB — MAGNESIUM: Magnesium: 2.2 mg/dL (ref 1.7–2.4)

## 2024-01-31 MED ORDER — HEPARIN (PORCINE) 25000 UT/250ML-% IV SOLN
1150.0000 [IU]/h | INTRAVENOUS | Status: DC
  Administered 2024-01-31: 1150 [IU]/h via INTRAVENOUS
  Filled 2024-01-31: qty 250

## 2024-01-31 MED ORDER — TECHNETIUM TO 99M ALBUMIN AGGREGATED
4.0000 | Freq: Once | INTRAVENOUS | Status: AC | PRN
  Administered 2024-01-31: 4.47 via INTRAVENOUS

## 2024-01-31 MED ORDER — ONDANSETRON HCL 4 MG/2ML IJ SOLN
4.0000 mg | Freq: Four times a day (QID) | INTRAMUSCULAR | Status: DC | PRN
  Administered 2024-01-31 – 2024-02-01 (×3): 4 mg via INTRAVENOUS
  Filled 2024-01-31 (×4): qty 2

## 2024-01-31 MED ORDER — METOPROLOL SUCCINATE ER 25 MG PO TB24
12.5000 mg | ORAL_TABLET | Freq: Every day | ORAL | Status: DC
Start: 2024-02-01 — End: 2024-02-01
  Administered 2024-02-01: 12.5 mg via ORAL
  Filled 2024-01-31: qty 1

## 2024-01-31 NOTE — Consult Note (Signed)
 PHARMACY - ANTICOAGULATION CONSULT NOTE  Pharmacy Consult for Heparin  Indication: chest pain/ACS  Allergies  Allergen Reactions   Losartan Other (See Comments)    Chest pain    Patient Measurements: Height: 5\' 9"  (175.3 cm) Weight: 84.4 kg (186 lb) IBW/kg (Calculated) : 70.7 Heparin Dosing Weight: 84.4 kg  Vital Signs: Temp: 97.8 F (36.6 C) (03/12 0500) Temp Source: Oral (03/12 0500) BP: 102/59 (03/12 0500) Pulse Rate: 66 (03/12 0500)  Labs: Recent Labs    01/30/24 1354 01/30/24 1532 01/30/24 1811 01/30/24 2312 01/31/24 0504  HGB 10.1*  --   --   --  9.3*  HCT 31.0*  --   --   --  28.1*  PLT 214  --   --   --  191  APTT  --   --  28  --   --   LABPROT  --   --  13.7  --   --   INR  --   --  1.0  --   --   HEPARINUNFRC  --   --   --  0.43 0.53  CREATININE 1.64*  --   --   --  1.57*  TROPONINIHS 91* 86*  --   --   --     Estimated Creatinine Clearance: 45.7 mL/min (A) (by C-G formula based on SCr of 1.57 mg/dL (H)).   Medical History: Past Medical History:  Diagnosis Date   Coronary artery disease 05/01/2023   PCI/DES   HFrEF (heart failure with reduced ejection fraction) (HCC) 05/01/2023   Hiatal hernia    HLD (hyperlipidemia)    HTN (hypertension)    Ischemic cardiomyopathy 05/01/2023   LVEF 30-35%   Kidney stones    Pneumothorax     Medications:  (Not in a hospital admission)  Scheduled:   aspirin EC  81 mg Oral Daily   atorvastatin  40 mg Oral Daily   metoprolol succinate  25 mg Oral Daily   polyethylene glycol  17 g Oral Daily   tamsulosin  0.4 mg Oral QPC supper   Infusions:   heparin 1,150 Units/hr (01/31/24 0200)   PRN: albuterol, morphine injection, oxyCODONE, traZODone  Assessment: Patient presented with chest pain and worse with breathing. PMH possibly includes CHF, HTN, CAD, CKD.  Trop 86. Pharmacy consulted to start heparin. No DOAC PTA. CBC stable.   Goal of Therapy:  Heparin level 0.3-0.7 units/ml Monitor platelets by  anticoagulation protocol: Yes   Plan:  3/12:  HL @ 0504 = 0.53, therapeutic X 2 - Will continue pt on current rate and recheck HL on 3/13 with AM labs.  Macgregor Aeschliman D, PharmD 01/31/2024,6:02 AM

## 2024-01-31 NOTE — Progress Notes (Signed)
 Heart Failure Stewardship Pharmacy Note  PCP: Margarita Mail, DO PCP-Cardiologist: Julien Nordmann, MD  HPI: Ronnie Mann is a 68 y.o. male with taghorn calculus, left hydronephrosis, CHF with EF 30-35%, HTN, HLD, CAD with NSTEMI s/p DES, CKD-3a, anemia, hiatal hernia, obesity who presented with sudden onset chest pain. Recently underwent R percutaneous nephrolithotomy and ureteral stent placement on 01/22/2024 at [facility] that was complicated by intraoperative pneumothorax requiring right-sided chest tube placement and vasopressors, which infiltrated into his forearm, requiring transfer to [facility]. Noted to have chest pain, elevated troponin in 700s, and EKG changes during that admission which resolved.   This admission, troponin was 91 > 86. BNP was 298.2. CXR showed no active disease. V/Q scan suggestive of PE.   Pertinent cardiac history: NSTEMI in 04/2023 s/p PCI to proximal LAD and RCA on 05/01/23. Echo at that time showed LVEF 30-35% with grade II diastolic dysfunction, moderate MR. Echo in 09/2023 noted LVEF 25-30% with low normal RV function, and moderate MR.  Pertinent Lab Values: Creatinine, Ser  Date Value Ref Range Status  01/31/2024 1.57 (H) 0.61 - 1.24 mg/dL Final   BUN  Date Value Ref Range Status  01/31/2024 45 (H) 8 - 23 mg/dL Final  40/98/1191 30 (H) 8 - 27 mg/dL Final   Potassium  Date Value Ref Range Status  01/31/2024 4.2 3.5 - 5.1 mmol/L Final   Sodium  Date Value Ref Range Status  01/31/2024 136 135 - 145 mmol/L Final  12/21/2023 138 134 - 144 mmol/L Final   B Natriuretic Peptide  Date Value Ref Range Status  01/30/2024 298.2 (H) 0.0 - 100.0 pg/mL Final    Comment:    Performed at [facility], [address] [place], [place] [address]   Magnesium  Date Value Ref Range Status  01/31/2024 2.2 1.7 - 2.4 mg/dL Final    Comment:    Performed at [facility], [address] [place], [place] [address]   Hgb A1c MFr  Bld  Date Value Ref Range Status  10/10/2023 6.1 (H) 4.8 - 5.6 % Final    Comment:    (NOTE) Pre diabetes:          5.7%-6.4%  Diabetes:              >6.4%  Glycemic control for   <7.0% adults with diabetes    TSH  Date Value Ref Range Status  04/28/2023 4.483 0.350 - 4.500 uIU/mL Final    Comment:    Performed by a 3rd Generation assay with a functional sensitivity of <=0.01 uIU/mL. Performed at [facility], [address] [place], [place] [address]    Vital Signs: Admission weight: 212 lbs Temp:  [97.3 F (36.3 C)-98.4 F (36.9 C)] 98.4 F (36.9 C) (03/12 1235) Pulse Rate:  [64-80] 70 (03/12 1235) Resp:  [13-21] 19 (03/12 1235) BP: (97-118)/(52-70) 99/52 (03/12 1235) SpO2:  [95 %-100 %] 96 % (03/12 1235)  Intake/Output Summary (Last 24 hours) at 01/31/2024 1456 Last data filed at 01/31/2024 8469 Gross per 24 hour  Intake 500 ml  Output 925 ml  Net -425 ml    Current Heart Failure Medications:  Loop diuretic: none Beta-Blocker: metoprolol succinate 12.5 mg daily ACEI/ARB/ARNI: none MRA: none SGLT2i: none  Prior to admission Heart Failure Medications:  Loop diuretic: furosemide 20 mg daily Beta-Blocker: metoprolol succinate 25 mg daily ACEI/ARB/ARNI: Entresto 24-26 mg bid MRA: none SGLT2i: none  Assessment: 1. 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 NYHA class III symptoms.  -Symptoms: Reports chest pain, shortness of breath, kidney pain, and fatigue. Symptoms are not likely due to CHF given low BNP on admission and other diagnoses to explain symptoms. -Volume:  Appears euvolemic to hypovolemic. -Hemodynamics:  BP is soft. HR 60-70s. -BB: Agree with reducing metoprolol succinate to 12.5 mg daily. -ACEI/ARB/ARNI: Entresto on hold due to hypotension. -MRA: Not eligible for MRA at this time. -SGLT2i: Not a candidate for this class given chronic uretal obstruction  and UTIs.  -Receiving heparin for PE.  Plan: 1) Medication changes recommended at this time: - None  2) Patient assistance: -Pending  3) Education: - Patient has been educated on current HF medications and potential additions to HF medication regimen - Patient verbalizes understanding that over the next few months, these medication doses may change and more medications may be added to optimize HF regimen - Patient has been educated on basic disease state pathophysiology and goals of therapy  Medication Assistance / Insurance Benefits Check: Does the patient have prescription insurance?    Type of insurance plan:  Does the patient qualify for medication assistance through manufacturers or grants? Pending   Outpatient Pharmacy: Prior to admission outpatient pharmacy: [facility]      Please do not hesitate to reach out with questions or concerns,  Enos Fling, PharmD, CPP, BCPS Heart Failure Pharmacist  Phone - [phone] 01/31/2024 2:56 PM

## 2024-01-31 NOTE — Consult Note (Signed)
 Cardiology Consultation:   Patient ID: Ronnie Mann; [id number]; 23-Sep-1956   Admit date: 01/30/2024 Date of Consult: 01/31/2024  Primary Care Provider: Margarita Mail, DO Primary Cardiologist: Mariah Milling Primary Electrophysiologist:  None   Patient Profile:   Ronnie Mann is a 68 y.o. male with a hx of CAD with NSTEMI in 04/2023 with PCI to the proximal LAD and RCA, HFrEF secondary to ICM, CKD stage IIIb, nephrolithiasis requiring multiple surgeries and lithotripsies, and hiatal hernia who has not followed up with general cardiology in the outpatient setting and is being seen today for the evaluation of chest pain and elevated high sensitivity troponin at the request of Dr. Chipper Herb.  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  Echo showed an EF of 30 to 35%, global hypokinesis with severe hypokinesis of the anterior, anteroseptal, and apical region, mildly dilated LV internal cavity size with mild LVH, grade 2 diastolic dysfunction, normal RV systolic function and ventricular cavity size, normal RVSP, moderate mitral regurgitation, and an estimated right atrial pressure of 8 mmHg.  LHC in 04/2023 showed severe two-vessel CAD with subtotal occlusion of the proximal LAD, diffuse moderate mid LAD disease and severe distal LAD disease.  In addition, there was 95% stenosis in the proximal RCA with faint right to left collaterals to the LAD.  The LCx had mild nonobstructive disease and OM 3 was noted to be large, reaching the apex.  LVEF estimated 25% with severely elevated LVEDP estimated at 34%.  He underwent successful PCI/DES to the proximal LAD and proximal RCA.  He was subsequently followed by the advanced heart failure service for assistance with medication optimization and diuresis.   He was readmitted later in 04/2023 with shortness of breath and left flank pain with associated hematuria.  At this time he was transition from Brilinta to clopidogrel and aspirin was discontinued.  He was readmitted in 09/2023 with recurrent nephrolithiasis with hydronephrosis and volume overload with demand ischemia.  Limited echo at that time showed a persistent cardiomyopathy with an EF of 25 to 30% with low normal RV systolic function, normal RVSP, moderately dilated left atrium, moderate mitral regurgitation, and mild dilatation of the aortic root measuring 40 mm.  He has been intermittently followed by the [facility] clinic.  He has not followed up with general cardiology since his initial admission in 04/2023.    He was recently admitted to [facility] from 3/3 through 01/27/24 with recurrent  nephrolithiasis status post right percutaneous nephrolithotomy and ureteral stent placement complicated by intraoperative pneumothorax requiring right-sided chest tube placement and necessitation of vasopressor support intraoperatively complicated by IV infiltration with concern for right forearm necrosis as well as his chest pain with equivocal EKG changes and elevated troponin peaking in the 2000's.  He was evaluated by cardiology with no acute indication for cardiac cath.    He presented to [facility] on 01/30/24 with bandlike chest pain that is worse with deep inspiration and rotational movement.  He has also continued to note some right-sided chest discomfort at the site of his prior chest tube.  EKG showed sinus rhythm with a right bundle and LVH with nonspecific ST-T changes.  High sensitivity troponin 91 with a delta troponin of 86.  BNP improved at 298 from prior value of 1051 in 11/2023.  Remaining notable lab's  include hemoglobin 10.1 trending to 9.3, BUN/serum creatinine 46/1.64 trending to 45/1.57.  Chest x-ray with no active disease with a moderate size and or hernia.  VQ scan pending.  At time of cardiology consult  he continues to note chest discomfort that is pleuritic in nature and worse with rotational movement.  He also reports he has been out of his narcotic pain medication.  No dizziness, presyncope, or syncope.  No lower extremity swelling.  At baseline, he sleeps in a recliner.    Past Medical History:  Diagnosis Date   Coronary artery disease 05/01/2023   PCI/DES   HFrEF (heart failure with reduced ejection fraction) (HCC) 05/01/2023   Hiatal hernia    HLD (hyperlipidemia)    HTN (hypertension)    Ischemic cardiomyopathy 05/01/2023   LVEF 30-35%   Kidney stones    Pneumothorax     Past Surgical History:  Procedure Laterality Date   CORONARY STENT INTERVENTION N/A 05/01/2023   Procedure: CORONARY STENT INTERVENTION;  Surgeon: Iran Ouch, MD;  Location: [facility] INVASIVE CV LAB;  Service: Cardiovascular;  Laterality: N/A;   CYSTOSCOPY W/ URETERAL STENT PLACEMENT Right 10/12/2023   Procedure: CYSTOSCOPY WITH STENT REPLACEMENT;  Surgeon: Riki Altes, MD;  Location: [facility] ORS;  Service: Urology;  Laterality: Right;   LEFT HEART CATH AND CORONARY ANGIOGRAPHY N/A 05/01/2023   Procedure: LEFT HEART CATH AND CORONARY ANGIOGRAPHY;  Surgeon: Iran Ouch, MD;  Location: [facility] INVASIVE CV LAB;  Service: Cardiovascular;  Laterality: N/A;     Home Meds: Prior to Admission medications   Medication Sig Start Date End Date Taking? Authorizing Provider  albuterol (VENTOLIN HFA) 108 (90 Base) MCG/ACT inhaler Inhale 2 puffs into the lungs every 6 (six) hours as needed for wheezing or shortness of breath. 10/14/23  Yes Alford Highland, MD  aspirin EC 81 MG tablet Take 81 mg by mouth daily. Swallow whole.   Yes [provider]  atorvastatin (LIPITOR) 40 MG tablet Take 1 tablet (40 mg total) by mouth daily. 01/08/24 02/07/24 Yes Hackney, Inetta Fermo A, FNP  furosemide (LASIX) 20 MG tablet Take 1 tablet (20 mg total) by mouth daily. 01/08/24  Yes Clarisa Kindred A, FNP  metoprolol succinate  (TOPROL-XL) 25 MG 24 hr tablet Take 1 tablet (25 mg total) by mouth daily. 01/08/24  Yes Clarisa Kindred A, FNP  nitroGLYCERIN (NITROSTAT) 0.4 MG SL tablet Place 0.4 mg under the tongue every 5 (five) minutes as needed for chest pain. 01/27/24 01/26/25 Yes [provider]  ondansetron (ZOFRAN-ODT) 4 MG disintegrating tablet Take 1 tablet (4 mg total) by mouth every 6 (six) hours as needed for nausea or vomiting. 12/03/23  Yes Ward, Layla Maw, DO  polyethylene glycol (MIRALAX / GLYCOLAX) 17 g packet Take 17 g by mouth daily. 10/14/23  Yes Wieting, Richard, MD  sacubitril-valsartan (ENTRESTO) 24-26 MG Take 1 tablet by mouth 2 (two) times daily. 01/24/24  Yes Laurey Morale, MD  solifenacin (VESICARE) 10 MG tablet Take 10 mg by mouth daily.   Yes [provider]  tamsulosin (FLOMAX) 0.4 MG CAPS capsule Take 1 capsule (0.4 mg total) by mouth daily after supper. 11/06/23  Yes Darlin Priestly, MD  traZODone (DESYREL) 50 MG tablet Take 0.5-1 tablets (25-50 mg total) by mouth at bedtime as needed for sleep. Patient taking differently: Take 50 mg by mouth at bedtime as needed for sleep. 11/27/23  Yes Margarita Mail, DO  oxyCODONE (OXY IR/ROXICODONE) 5 MG immediate release tablet Take 10 mg by mouth every  4 (four) hours as needed for severe pain (pain score 7-10). Patient not taking: Reported on 01/30/2024 01/27/24 02/01/24  [provider]  spironolactone (ALDACTONE) 25 MG tablet Take 1 tablet (25 mg total) by mouth daily. Patient not taking: Reported on 01/04/2024 08/30/23 12/18/23  Dorthula Nettles, DO    Inpatient Medications: Scheduled Meds:  aspirin EC  81 mg Oral Daily   atorvastatin  40 mg Oral Daily   metoprolol succinate  25 mg Oral Daily   polyethylene glycol  17 g Oral Daily   tamsulosin  0.4 mg Oral QPC supper   Continuous Infusions:  heparin 1,150 Units/hr (01/31/24 0606)   PRN Meds: albuterol, morphine injection, oxyCODONE, traZODone  Allergies:   Allergies  Allergen  Reactions   Losartan Other (See Comments)    Chest pain    Social History:   Social History   Socioeconomic History   Marital status: Divorced    Spouse name: Not on file   Number of children: Not on file   Years of education: Not on file   Highest education level: 10th grade  Occupational History   Not on file  Tobacco Use   Smoking status: Never    Passive exposure: Never   Smokeless tobacco: Never  Vaping Use   Vaping status: Never Used  Substance and Sexual Activity   Alcohol use: Never   Drug use: Yes    Types: Marijuana   Sexual activity: Not on file  Other Topics Concern   Not on file  Social History Narrative   Not on file   Social Drivers of Health   Financial Resource Strain: Low Risk  (01/23/2024)   Received [facility]   Overall Financial Resource Strain (CARDIA)    Difficulty of Paying Living Expenses: Not hard at all  Food Insecurity: No Food Insecurity (01/23/2024)   Received from [facility]   Hunger Vital Sign    Worried About Running Out of Food in the Last Year: Never true    Ran Out of Food in the Last Year: Never true  Transportation Needs: No Transportation Needs (01/23/2024)   Received from [facility]   PRAPARE - Transportation    Lack of Transportation (Medical): No    Lack of Transportation (Non-Medical): No  Physical Activity: Not on file  Stress: Not on file  Social Connections: Not on file  Intimate Partner Violence: Not At Risk (11/05/2023)   Humiliation, Afraid, Rape, and Kick questionnaire    Fear of Current or Ex-Partner: No    Emotionally Abused: No    Physically Abused: No    Sexually Abused: No     Family History:   Family History  Problem Relation Age of Onset   Heart disease Father     ROS:  Review of Systems  Constitutional:  Positive for malaise/fatigue. Negative for chills, diaphoresis, fever and weight loss.  HENT:  Negative for congestion.   Eyes:  Negative for discharge and redness.   Respiratory:  Negative for cough, sputum production, shortness of breath and wheezing.   Cardiovascular:  Positive for chest pain. Negative for palpitations, orthopnea, claudication, leg swelling and PND.  Gastrointestinal:  Negative for abdominal pain, blood in stool, heartburn, melena, nausea and vomiting.  Genitourinary:  Positive for flank pain.  Musculoskeletal:  Positive for back pain. Negative for falls and myalgias.  Skin:  Negative for rash.  Neurological:  Positive for weakness. Negative for dizziness, tingling, tremors, sensory change, speech change, focal weakness and  loss of consciousness.  Endo/Heme/Allergies:  Does not bruise/bleed easily.  Psychiatric/Behavioral:  Negative for substance abuse. The patient is not nervous/anxious.   All other systems reviewed and are negative.     Physical Exam/Data:   Vitals:   01/30/24 1800 01/30/24 2051 01/31/24 0105 01/31/24 0500  BP: 116/60 116/67 118/70 (!) 102/59  Pulse: 80 76 69 66  Resp: 20 17 16 17   Temp:  (!) 97.3 F (36.3 C) (!) 97.5 F (36.4 C) 97.8 F (36.6 C)  TempSrc:  Temporal Oral Oral  SpO2: 100% 98% 95% 98%  Weight:      Height:        Intake/Output Summary (Last 24 hours) at 01/31/2024 0934 Last data filed at 01/31/2024 1610 Gross per 24 hour  Intake 500 ml  Output 925 ml  Net -425 ml   Filed Weights   01/30/24 1351  Weight: 84.4 kg   Body mass index is 27.47 kg/m.   Physical Exam: General: Well developed, well nourished, in no acute distress. Chest pain is worsening with rotational movement in the hospital bed.  Head: Normocephalic, atraumatic, sclera non-icteric, no xanthomas, nares without discharge.  Neck: Negative for carotid bruits. JVD not elevated. Lungs: Clear bilaterally to auscultation without wheezes, rales, or rhonchi. Breathing is unlabored. Heart: RRR with S1 S2. No murmurs, rubs, or gallops appreciated. Palpation of the anterior chest wall reproduces his chest pain.  Abdomen: Soft,  non-tender, non-distended with normoactive bowel sounds. No hepatomegaly. No rebound/guarding. No obvious abdominal masses. Msk:  Strength and tone appear normal for age. Extremities: No clubbing or cyanosis. No edema.  Notable firmness along the right forearm. Neuro: Alert and oriented X 3. No facial asymmetry. No focal deficit. Moves all extremities spontaneously. Psych:  Responds to questions appropriately with a normal affect.   EKG:  The EKG was personally reviewed and demonstrates: NSR, 79 bpm, RBBB, LVH, nonspecific ST-T changes Telemetry:  Telemetry was personally reviewed and demonstrates: SR  Weights: Filed Weights   01/30/24 1351  Weight: 84.4 kg    Relevant CV Studies:  2D echo 01/25/2024 [facility]): Summary   1. Technically difficult study.    2. The left ventricle is upper normal in size with mildly increased wall  thickness.   3. The left ventricular systolic function is severely decreased, LVEF is  visually estimated at 25%.    4. There is grade I diastolic dysfunction (impaired relaxation).    5. There is mild mitral valve regurgitation.    6. The aorta at the sinuses of Valsalva and ascending aorta is mildly  dilated.   7. The right ventricle is normal in size, with normal systolic function.  __________  Limited echo 10/13/2023: 1. Left ventricular ejection fraction, by estimation, is 25 to 30%. The  left ventricle has severely decreased function. The left ventricular  internal cavity size was mildly dilated.   2. Right ventricular systolic function is low normal. The right  ventricular size is normal. There is normal pulmonary artery systolic  pressure.   3. Left atrial size was moderately dilated.   4. The mitral valve is normal in structure. Moderate mitral valve  regurgitation.   5. The aortic valve is tricuspid. Aortic valve regurgitation is not  visualized.   6. Aortic dilatation noted. There is mild dilatation of the aortic root,  measuring 40 mm.   7.  The inferior vena cava is normal in size with greater than 50%  respiratory variability, suggesting right atrial pressure of 3 mmHg.  __________  LHC 05/01/2023:   Prox LAD to Mid LAD lesion is 99% stenosed.   Mid LAD lesion is 60% stenosed.   1st Diag lesion is 70% stenosed.   Prox RCA lesion is 95% stenosed.   Mid RCA lesion is 30% stenosed.   RPDA lesion is 60% stenosed.   Dist LAD lesion is 99% stenosed.   A drug-eluting stent was successfully placed using a STENT ONYX FRONTIER [id number].   Balloon angioplasty was performed using a BALLN [place] EUPHORA RX [id number].   Post intervention, there is a 20% residual stenosis.   Post intervention, there is a 0% residual stenosis.   There is severe left ventricular systolic dysfunction.   LV end diastolic pressure is severely elevated.   There is moderate (3+) mitral regurgitation.   1.  Severe two-vessel coronary artery disease with subtotal occlusion of the proximal LAD, diffuse moderate mid LAD disease and severe distal LAD disease.  In addition, there is 95% in the proximal right coronary artery with faint right to left collaterals to the LAD.  The left circumflex has mild nonobstructive disease and OM 3 is large and reaches the apex. 2.  Severely reduced LV systolic function with an EF of 25%.  Severely elevated left ventricular end-diastolic pressure at 34 mmHg. 3.  Successful balloon angioplasty to the proximal LAD and drug-eluting stent placement to the proximal right coronary artery.   Recommendations: The patient developed progressive pulmonary edema as the case progressed.  He was given 1 dose of IV furosemide and was started on nitroglycerin drip.  He will be started on BiPAP. I elected not to place a stent in the LAD given poor distal flow and diffuse disease overall.  In addition, placing a stent would risk closing the first diagonal. Recommend optimizing his heart failure and volume overload and consider relook angiography in the near  future once he is optimized. I consulted advanced heart failure. __________  2D echo 04/30/2023: 1. Left ventricular ejection fraction, by estimation, is 30 to 35%. Left  ventricular ejection fraction by PLAX is 32 %. The left ventricle has  moderately decreased function. The left ventricle demonstrates global  hypokinesis with severe hypokinesis of  the anterior, anteroseptal and apical region. The left ventricular  internal cavity size was mildly dilated. There is mild left ventricular  hypertrophy. Left ventricular diastolic parameters are consistent with  Grade II diastolic dysfunction  (pseudonormalization).   2. Right ventricular systolic function is normal. The right ventricular  size is normal. There is normal pulmonary artery systolic pressure.   3. The mitral valve is normal in structure. Moderate mitral valve  regurgitation. No evidence of mitral stenosis.   4. The aortic valve is normal in structure. Aortic valve regurgitation is  not visualized. No aortic stenosis is present.   5. There is mild dilatation of the ascending aorta, measuring 42 mm.   6. The inferior vena cava is normal in size with <50% respiratory  variability, suggesting right atrial pressure of 8 mmHg.   Laboratory Data:  Chemistry Recent Labs  Lab 01/30/24 1354 01/31/24 0504  NA 135 136  K 4.4 4.2  CL 107 106  CO2 20* 23  GLUCOSE 194* 141*  BUN 46* 45*  CREATININE 1.64* 1.57*  CALCIUM 9.3 8.8*  GFRNONAA 46* 48*  ANIONGAP 8 7    No results for input(s): "PROT", "ALBUMIN", "AST", "ALT", "ALKPHOS", "BILITOT" in the last 168 hours. Hematology Recent Labs  Lab 01/30/24 1354 01/31/24 0504  WBC 6.0  5.5  RBC 3.24* 2.98*  HGB 10.1* 9.3*  HCT 31.0* 28.1*  MCV 95.7 94.3  MCH 31.2 31.2  MCHC 32.6 33.1  RDW 15.0 15.2  PLT 214 191   Cardiac EnzymesNo results for input(s): "TROPONINI" in the last 168 hours. No results for input(s): "TROPIPOC" in the last 168 hours.  BNP Recent Labs  Lab  01/30/24 1354  BNP 298.2*    DDimer No results for input(s): "DDIMER" in the last 168 hours.  Radiology/Studies:  [facility] Chest Port 1 View Result Date: 01/30/2024 IMPRESSION: 1. No active disease. 2. Moderate size hiatal hernia. Electronically Signed   By: Elgie Collard M.D.   On: 01/30/2024 16:34    Assessment and Plan:   1.  CAD with chest pain and elevated high-sensitivity troponin: -Overall, chest pain appears to be atypical in presentation and consistent with pleuritic/musculoskeletal component based on history -Minimally elevated high-sensitivity troponin is downtrending and possibly in the setting of his recent high-sensitivity troponin peaking in the 2000's at [facility] earlier this month, at which time cardiac cath was not felt to be indicated -VQ scan pending -Reasonable to continue heparin drip until VQ scan has resulted -Based on symptoms trend, and V/Q results, consider Lexiscan MPI -Previously on DAPT, though this was interrupted in the setting of ongoing hematuria with recurrent nephrolithiasis -Now on aspirin 81 mg daily -Remains on atorvastatin -Needs outpatient follow-up with general cardiology  2.  HFrEF with ICM: -Appears euvolemic and well compensated -Currently on Toprol-XL -Escalate GDMT as able moving forward based on blood pressure and renal function -Needs follow-up with advanced heart failure service in the outpatient setting  3.  Recurrent nephrolithiasis with anemia and CKD stage IIIb: -Monitor Hgb -Will have follow-up with urology as outpatient        For questions or updates, please contact [facility] Please consult [email or web address] for contact info under Cardiology/STEMI.   Signed, Eula Listen, PA-C [facility] Pager: [phone] 01/31/2024, 9:34 AM

## 2024-01-31 NOTE — Progress Notes (Signed)
 PROGRESS NOTE    DALLIS CZAJA  VWU:[id number] DOB: [birth year] DOA: 01/30/2024 PCP: Margarita Mail, DO  Outpatient Specialists: cardiology    Brief Narrative:   From admission h and p  BAYLEY HURN is a 68 y.o. male with medical history significant of coronary artery disease, chronic systolic congestive heart failure, essential hypertension, dyslipidemia, who came to the hospital with complaints of sudden onset of chest pain.   Patient had a cardiac angiogram a year ago, had a high-grade coronary occlusion in LAD and RCA.  Balloon angioplasty was performed to proximal LAD and a drug-eluting stent placement into proximal RCA.  Echocardiogram performed that time showed ejection fraction 30 to 35%. Patient was admitted to [facility] and discharged last Friday with pneumothorax.  Since that time, patient has been having some right-sided chest pain with deep breath.   Patient had a sudden onset of chest pain localized in the left side chest radiate to the left arm-8:30 AM yesterday.  He took some nitroglycerin, and slept.  He had another episode of chest pain today at 9:30 AM, was described as a sharp, persistent.  10/10 in severity.  Radiate to the left arm.  The pain never got better before he came to the hospital, he still has some moderate pain now.   Patient also complaining short of breath which has been worse.  Patient has been sleeping in his recliner for many years, he has short of breath with minimal exertion at baseline.  He describes worsening short of breath for the last 3 days.  He also had significant paroxysmal nocturnal dyspnea even he sleeps in the recliner.  He did not have any leg edema, he lost weight.  Assessment & Plan:   Principal Problem:   Unstable angina (HCC) Active Problems:   Chronic combined systolic (congestive) and diastolic (congestive) heart failure (HCC)   CKD stage 3a, GFR 45-59 ml/min (HCC)  # CAD # Chest pain History severe 2-vessel disease on  most recent cath. Also recent pneumothorax with chest tube. Troponins flat and only mildly elevated. No overt ischemic changes on ekg - cardiology to see - home asa, statin, metop - on heparin - v/q scan ordered, result pending  # HFrEF EF earlier this month at [facility] 25%. Followed by our advanced heart failure clinic. Appears euvolemic - home metop - defer diuretics and gdmt to cardiology  # nephrolithiasis Recent urologic procedure with stent placement, plan is for self-d/c (has string from [facility]) in about 1 week, is followed by [facility]. Kidney function is at baseline. No s/s infection - outpt [facility] f/u - tiovaz for home vesicare, home flomax  # Recent pneumothorax Iatrogenic complication of urologic procedure. Has some pain at former chest tube site but skin appears normal, cxr no recurrence  # Ckd ca Kidney function at baseline - monitor   DVT prophylaxis: lovenox Code Status: full Family Communication: son updated telephonically 3/12  Level of care: Telemetry Cardiac Status is: Inpatient Remains inpatient appropriate because: inpatient w/u    Consultants:  cardiology  Procedures: None thus far  Antimicrobials:  none    Subjective: Reports mild ongoing chest pain left and right  Objective: Vitals:   01/30/24 2051 01/31/24 0105 01/31/24 0500 01/31/24 0935  BP: 116/67 118/70 (!) 102/59 103/61  Pulse: 76 69 66 64  Resp: 17 16 17 13   Temp: (!) 97.3 F (36.3 C) (!) 97.5 F (36.4 C) 97.8 F (36.6 C) 98 F (36.7 C)  TempSrc: Temporal Oral Oral Oral  SpO2: 98% 95% 98% 96%  Weight:      Height:        Intake/Output Summary (Last 24 hours) at 01/31/2024 1044 Last data filed at 01/31/2024 1610 Gross per 24 hour  Intake 500 ml  Output 925 ml  Net -425 ml   Filed Weights   01/30/24 1351  Weight: 84.4 kg    Examination:  General exam: Appears in mild pain Respiratory system: Clear to auscultation. Respiratory effort normal. Cardiovascular system: S1 & S2  heard, RR, distant heart sounds Gastrointestinal system: Abdomen is nondistended, soft and nontender.   Central nervous system: Alert and oriented. No focal neurological deficits. Extremities: Symmetric 5 x 5 power. Skin: No rashes, lesions or ulcers Psychiatry: Judgement and insight appear normal. Mood & affect appropriate.     Data Reviewed: I have personally reviewed following labs and imaging studies  CBC: Recent Labs  Lab 01/30/24 1354 01/31/24 0504  WBC 6.0 5.5  HGB 10.1* 9.3*  HCT 31.0* 28.1*  MCV 95.7 94.3  PLT 214 191   Basic Metabolic Panel: Recent Labs  Lab 01/30/24 1354 01/31/24 0504  NA 135 136  K 4.4 4.2  CL 107 106  CO2 20* 23  GLUCOSE 194* 141*  BUN 46* 45*  CREATININE 1.64* 1.57*  CALCIUM 9.3 8.8*  MG  --  2.2   GFR: Estimated Creatinine Clearance: 45.7 mL/min (A) (by C-G formula based on SCr of 1.57 mg/dL (H)). Liver Function Tests: No results for input(s): "AST", "ALT", "ALKPHOS", "BILITOT", "PROT", "ALBUMIN" in the last 168 hours. No results for input(s): "LIPASE", "AMYLASE" in the last 168 hours. No results for input(s): "AMMONIA" in the last 168 hours. Coagulation Profile: Recent Labs  Lab 01/30/24 1811  INR 1.0   Cardiac Enzymes: No results for input(s): "CKTOTAL", "CKMB", "CKMBINDEX", "TROPONINI" in the last 168 hours. BNP (last 3 results) Recent Labs    12/21/23 1421  PROBNP 1,262*   HbA1C: No results for input(s): "HGBA1C" in the last 72 hours. CBG: No results for input(s): "GLUCAP" in the last 168 hours. Lipid Profile: No results for input(s): "CHOL", "HDL", "LDLCALC", "TRIG", "CHOLHDL", "LDLDIRECT" in the last 72 hours. Thyroid Function Tests: No results for input(s): "TSH", "T4TOTAL", "FREET4", "T3FREE", "THYROIDAB" in the last 72 hours. Anemia Panel: No results for input(s): "VITAMINB12", "FOLATE", "FERRITIN", "TIBC", "IRON", "RETICCTPCT" in the last 72 hours. Urine analysis:    Component Value Date/Time   COLORURINE  AMBER (A) 01/30/2024 1852   APPEARANCEUR HAZY (A) 01/30/2024 1852   APPEARANCEUR Cloudy (A) 10/23/2023 1417   LABSPEC 1.010 01/30/2024 1852   PHURINE 5.0 01/30/2024 1852   GLUCOSEU NEGATIVE 01/30/2024 1852   HGBUR LARGE (A) 01/30/2024 1852   BILIRUBINUR NEGATIVE 01/30/2024 1852   BILIRUBINUR Negative 10/23/2023 1417   KETONESUR NEGATIVE 01/30/2024 1852   PROTEINUR 30 (A) 01/30/2024 1852   NITRITE POSITIVE (A) 01/30/2024 1852   LEUKOCYTESUR MODERATE (A) 01/30/2024 1852   Sepsis Labs: @LABRCNTIP (procalcitonin:4,lacticidven:4)  )No results found for this or any previous visit (from the past 240 hours).       Radiology Studies: DG Chest Port 1 View Result Date: 01/30/2024 CLINICAL DATA:  Chest pain. EXAM: PORTABLE CHEST 1 VIEW COMPARISON:  Chest radiograph dated 12/03/2023. FINDINGS: No focal consolidation, pleural effusion, or pneumothorax. Minimal left lung base atelectasis. The cardiac silhouette is within normal limits. Moderate size hiatal hernia. No acute osseous pathology. IMPRESSION: 1. No active disease. 2. Moderate size hiatal hernia. Electronically Signed   By: Ceasar Mons.D.  On: 01/30/2024 16:34        Scheduled Meds:  aspirin EC  81 mg Oral Daily   atorvastatin  40 mg Oral Daily   metoprolol succinate  25 mg Oral Daily   polyethylene glycol  17 g Oral Daily   tamsulosin  0.4 mg Oral QPC supper   Continuous Infusions:  heparin 1,150 Units/hr (01/31/24 0606)     LOS: 1 day     Silvano Bilis, MD [facility]   If 7PM-7AM, please contact night-coverage [email or web address] Password [facility] 01/31/2024, 10:44 AM

## 2024-01-31 NOTE — Consult Note (Signed)
 PHARMACY - ANTICOAGULATION CONSULT NOTE  Pharmacy Consult for Heparin  Indication: chest pain/ACS  Allergies  Allergen Reactions   Losartan Other (See Comments)    Chest pain    Patient Measurements: Height: 5\' 9"  (175.3 cm) Weight: 84.4 kg (186 lb) IBW/kg (Calculated) : 70.7 Heparin Dosing Weight: 84.4 kg  Vital Signs: Temp: 98.3 F (36.8 C) (03/12 1959) Temp Source: Oral (03/12 1701) BP: 100/63 (03/12 1959) Pulse Rate: 62 (03/12 1959)  Labs: Recent Labs    01/30/24 1354 01/30/24 1532 01/30/24 1811 01/30/24 2312 01/31/24 0504 01/31/24 2104  HGB 10.1*  --   --   --  9.3*  --   HCT 31.0*  --   --   --  28.1*  --   PLT 214  --   --   --  191  --   APTT  --   --  28  --   --   --   LABPROT  --   --  13.7  --   --   --   INR  --   --  1.0  --   --   --   HEPARINUNFRC  --   --   --  0.43 0.53 0.35  CREATININE 1.64*  --   --   --  1.57*  --   TROPONINIHS 91* 86*  --   --   --   --     Estimated Creatinine Clearance: 45.7 mL/min (A) (by C-G formula based on SCr of 1.57 mg/dL (H)).   Medical History: Past Medical History:  Diagnosis Date   Coronary artery disease 05/01/2023   PCI/DES   HFrEF (heart failure with reduced ejection fraction) (HCC) 05/01/2023   Hiatal hernia    HLD (hyperlipidemia)    HTN (hypertension)    Ischemic cardiomyopathy 05/01/2023   LVEF 30-35%   Kidney stones    Pneumothorax     Medications:  Medications Prior to Admission  Medication Sig Dispense Refill Last Dose/Taking   albuterol (VENTOLIN HFA) 108 (90 Base) MCG/ACT inhaler Inhale 2 puffs into the lungs every 6 (six) hours as needed for wheezing or shortness of breath. 17 each 0 Taking As Needed   aspirin EC 81 MG tablet Take 81 mg by mouth daily. Swallow whole.   01/29/2024   atorvastatin (LIPITOR) 40 MG tablet Take 1 tablet (40 mg total) by mouth daily. 90 tablet 3 01/29/2024   furosemide (LASIX) 20 MG tablet Take 1 tablet (20 mg total) by mouth daily. 90 tablet 3 01/29/2024    metoprolol succinate (TOPROL-XL) 25 MG 24 hr tablet Take 1 tablet (25 mg total) by mouth daily. 90 tablet 3 01/29/2024   nitroGLYCERIN (NITROSTAT) 0.4 MG SL tablet Place 0.4 mg under the tongue every 5 (five) minutes as needed for chest pain.   01/29/2024   ondansetron (ZOFRAN-ODT) 4 MG disintegrating tablet Take 1 tablet (4 mg total) by mouth every 6 (six) hours as needed for nausea or vomiting. 20 tablet 0 Taking As Needed   polyethylene glycol (MIRALAX / GLYCOLAX) 17 g packet Take 17 g by mouth daily. 30 each 0 Taking   sacubitril-valsartan (ENTRESTO) 24-26 MG Take 1 tablet by mouth 2 (two) times daily. 180 tablet 2 01/29/2024   solifenacin (VESICARE) 10 MG tablet Take 10 mg by mouth daily.   01/29/2024   tamsulosin (FLOMAX) 0.4 MG CAPS capsule Take 1 capsule (0.4 mg total) by mouth daily after supper. 30 capsule 2 01/29/2024   traZODone (DESYREL) 50  MG tablet Take 0.5-1 tablets (25-50 mg total) by mouth at bedtime as needed for sleep. (Patient taking differently: Take 50 mg by mouth at bedtime as needed for sleep.) 90 tablet 0 01/29/2024   oxyCODONE (OXY IR/ROXICODONE) 5 MG immediate release tablet Take 10 mg by mouth every 4 (four) hours as needed for severe pain (pain score 7-10). (Patient not taking: Reported on 01/30/2024)   Not Taking   [Paused] spironolactone (ALDACTONE) 25 MG tablet Take 1 tablet (25 mg total) by mouth daily. (Patient not taking: Reported on 01/04/2024) 45 tablet 3    Scheduled:   aspirin EC  81 mg Oral Daily   atorvastatin  40 mg Oral Daily   [START ON 02/01/2024] metoprolol succinate  12.5 mg Oral Daily   polyethylene glycol  17 g Oral Daily   tamsulosin  0.4 mg Oral QPC supper   Infusions:   heparin 1,150 Units/hr (01/31/24 1443)   PRN: albuterol, morphine injection, ondansetron (ZOFRAN) IV, oxyCODONE, traZODone  Assessment: Patient presented with chest pain and worse with breathing. PMH possibly includes CHF, HTN, CAD, CKD.  Trop 86. Pharmacy consulted to start heparin.  No DOAC PTA. CBC stable.   Date/Time HL Rate  Comment 3/12 2104 0.35 1500 units/hr Therapeutic x 1  Goal of Therapy:  Heparin level 0.3-0.7 units/ml Monitor platelets by anticoagulation protocol: Yes   Plan:  Heparin level therapeutic x 1 Continue heparin infusion at rate of 1500 units/hr Check heparin level in 6 hours Daily CBC while on heparin  Merryl Hacker, PharmD 01/31/2024,9:41 PM

## 2024-01-31 NOTE — Consult Note (Signed)
 PHARMACY - ANTICOAGULATION CONSULT NOTE  Pharmacy Consult for Heparin  Indication: chest pain/ACS  Allergies  Allergen Reactions   Losartan Other (See Comments)    Chest pain    Patient Measurements: Height: 5\' 9"  (175.3 cm) Weight: 84.4 kg (186 lb) IBW/kg (Calculated) : 70.7 Heparin Dosing Weight: 84.4 kg  Vital Signs: Temp: 98.4 F (36.9 C) (03/12 1235) Temp Source: Oral (03/12 1235) BP: 99/52 (03/12 1235) Pulse Rate: 70 (03/12 1235)  Labs: Recent Labs    01/30/24 1354 01/30/24 1532 01/30/24 1811 01/30/24 2312 01/31/24 0504  HGB 10.1*  --   --   --  9.3*  HCT 31.0*  --   --   --  28.1*  PLT 214  --   --   --  191  APTT  --   --  28  --   --   LABPROT  --   --  13.7  --   --   INR  --   --  1.0  --   --   HEPARINUNFRC  --   --   --  0.43 0.53  CREATININE 1.64*  --   --   --  1.57*  TROPONINIHS 91* 86*  --   --   --     Estimated Creatinine Clearance: 45.7 mL/min (A) (by C-G formula based on SCr of 1.57 mg/dL (H)).   Medical History: Past Medical History:  Diagnosis Date   Coronary artery disease 05/01/2023   PCI/DES   HFrEF (heart failure with reduced ejection fraction) (HCC) 05/01/2023   Hiatal hernia    HLD (hyperlipidemia)    HTN (hypertension)    Ischemic cardiomyopathy 05/01/2023   LVEF 30-35%   Kidney stones    Pneumothorax     Medications:  (Not in a hospital admission)  Scheduled:   aspirin EC  81 mg Oral Daily   atorvastatin  40 mg Oral Daily   [START ON 02/01/2024] metoprolol succinate  12.5 mg Oral Daily   polyethylene glycol  17 g Oral Daily   tamsulosin  0.4 mg Oral QPC supper   Infusions:   heparin     PRN: albuterol, morphine injection, ondansetron (ZOFRAN) IV, oxyCODONE, traZODone  Assessment: Patient presented with chest pain and worse with breathing. PMH possibly includes CHF, HTN, CAD, CKD.  Trop 86. Pharmacy consulted to start heparin. No DOAC PTA. CBC stable.   Goal of Therapy:  Heparin level 0.3-0.7  units/ml Monitor platelets by anticoagulation protocol: Yes   Plan:  Patient was previously therapeutic at 1500 units/hr, heparin stopped at 3/12 @ 1216, since only off for a little over two hours will not re-bolus Resume heparin infusion at previous rate of 1500 units/hr Check heparin level in 6 hours Daily CBC while on heparin  Barrie Folk, PharmD 01/31/2024,2:32 PM

## 2024-01-31 NOTE — Consult Note (Signed)
 PHARMACY - ANTICOAGULATION CONSULT NOTE  Pharmacy Consult for Heparin  Indication: chest pain/ACS  Allergies  Allergen Reactions   Losartan Other (See Comments)    Chest pain    Patient Measurements: Height: 5\' 9"  (175.3 cm) Weight: 84.4 kg (186 lb) IBW/kg (Calculated) : 70.7 Heparin Dosing Weight: 84.4 kg  Vital Signs: Temp: 97.3 F (36.3 C) (03/11 2051) Temp Source: Temporal (03/11 2051) BP: 116/67 (03/11 2051) Pulse Rate: 76 (03/11 2051)  Labs: Recent Labs    01/30/24 1354 01/30/24 1532 01/30/24 1811 01/30/24 2312  HGB 10.1*  --   --   --   HCT 31.0*  --   --   --   PLT 214  --   --   --   APTT  --   --  28  --   LABPROT  --   --  13.7  --   INR  --   --  1.0  --   HEPARINUNFRC  --   --   --  0.43  CREATININE 1.64*  --   --   --   TROPONINIHS 91* 86*  --   --     Estimated Creatinine Clearance: 43.7 mL/min (A) (by C-G formula based on SCr of 1.64 mg/dL (H)).   Medical History: Past Medical History:  Diagnosis Date   Coronary artery disease 05/01/2023   PCI/DES   HFrEF (heart failure with reduced ejection fraction) (HCC) 05/01/2023   Hiatal hernia    HLD (hyperlipidemia)    HTN (hypertension)    Ischemic cardiomyopathy 05/01/2023   LVEF 30-35%   Kidney stones    Pneumothorax     Medications:  (Not in a hospital admission)  Scheduled:   aspirin EC  81 mg Oral Daily   atorvastatin  40 mg Oral Daily   metoprolol succinate  25 mg Oral Daily   polyethylene glycol  17 g Oral Daily   tamsulosin  0.4 mg Oral QPC supper   Infusions:   heparin 1,150 Units/hr (01/30/24 1807)   PRN: albuterol, morphine injection, oxyCODONE, traZODone  Assessment: Patient presented with chest pain and worse with breathing. PMH possibly includes CHF, HTN, CAD, CKD.  Trop 86. Pharmacy consulted to start heparin. No DOAC PTA. CBC stable.   Goal of Therapy:  Heparin level 0.3-0.7 units/ml Monitor platelets by anticoagulation protocol: Yes   Plan:  3/11:  HL @ 2312 =  0.43, therapeutic X 1 - will continue pt on current rate and recheck HL in 6 hrs on 3/12 @ 0500.   Yahayra Geis D, PharmD 01/31/2024,12:51 AM

## 2024-02-01 ENCOUNTER — Other Ambulatory Visit (HOSPITAL_COMMUNITY): Payer: Self-pay

## 2024-02-01 ENCOUNTER — Encounter: Payer: Self-pay | Admitting: Internal Medicine

## 2024-02-01 ENCOUNTER — Telehealth (HOSPITAL_COMMUNITY): Payer: Self-pay | Admitting: Pharmacy Technician

## 2024-02-01 ENCOUNTER — Other Ambulatory Visit: Payer: Self-pay

## 2024-02-01 DIAGNOSIS — I2694 Multiple subsegmental pulmonary emboli without acute cor pulmonale: Secondary | ICD-10-CM

## 2024-02-01 LAB — CBC
HCT: 28.5 % — ABNORMAL LOW (ref 39.0–52.0)
Hemoglobin: 9.1 g/dL — ABNORMAL LOW (ref 13.0–17.0)
MCH: 30.2 pg (ref 26.0–34.0)
MCHC: 31.9 g/dL (ref 30.0–36.0)
MCV: 94.7 fL (ref 80.0–100.0)
Platelets: 204 10*3/uL (ref 150–400)
RBC: 3.01 MIL/uL — ABNORMAL LOW (ref 4.22–5.81)
RDW: 14.9 % (ref 11.5–15.5)
WBC: 5 10*3/uL (ref 4.0–10.5)
nRBC: 0 % (ref 0.0–0.2)

## 2024-02-01 LAB — BASIC METABOLIC PANEL
Anion gap: 6 (ref 5–15)
BUN: 39 mg/dL — ABNORMAL HIGH (ref 8–23)
CO2: 24 mmol/L (ref 22–32)
Calcium: 9 mg/dL (ref 8.9–10.3)
Chloride: 105 mmol/L (ref 98–111)
Creatinine, Ser: 1.53 mg/dL — ABNORMAL HIGH (ref 0.61–1.24)
GFR, Estimated: 50 mL/min — ABNORMAL LOW (ref >60)
Glucose, Bld: 109 mg/dL — ABNORMAL HIGH (ref 70–99)
Potassium: 4.6 mmol/L (ref 3.5–5.1)
Sodium: 135 mmol/L (ref 135–145)

## 2024-02-01 LAB — HEPARIN LEVEL (UNFRACTIONATED): Heparin Unfractionated: 0.41 [IU]/mL (ref 0.30–0.70)

## 2024-02-01 MED ORDER — ENSURE ENLIVE PO LIQD
237.0000 mL | Freq: Two times a day (BID) | ORAL | Status: DC
  Administered 2024-02-01 (×2): 237 mL via ORAL

## 2024-02-01 MED ORDER — OXYCODONE HCL 5 MG PO TABS
5.0000 mg | ORAL_TABLET | Freq: Four times a day (QID) | ORAL | 0 refills | Status: DC | PRN
  Filled 2024-02-01: qty 15, 4d supply, fill #0

## 2024-02-01 MED ORDER — APIXABAN 5 MG PO TABS
ORAL_TABLET | ORAL | 1 refills | Status: DC
  Filled 2024-02-01: qty 74, 30d supply, fill #0
  Filled 2024-03-08: qty 74, 37d supply, fill #1

## 2024-02-01 MED ORDER — APIXABAN 5 MG PO TABS
10.0000 mg | ORAL_TABLET | Freq: Two times a day (BID) | ORAL | Status: DC
  Administered 2024-02-01: 10 mg via ORAL
  Filled 2024-02-01: qty 2

## 2024-02-01 MED ORDER — APIXABAN 5 MG PO TABS: 5.0000 mg | ORAL_TABLET | Freq: Two times a day (BID) | ORAL | Status: DC

## 2024-02-01 NOTE — Plan of Care (Signed)

## 2024-02-01 NOTE — Discharge Summary (Signed)
 Ronnie Mann HQI:[id number] DOB: 21-May-1956 DOA: 01/30/2024  PCP: Margarita Mail, DO  Admit date: 01/30/2024 Discharge date: 02/01/2024  Time spent: 35 minutes  Recommendations for Outpatient Follow-up:  Urology f/u Pcp f/u Heart failure clinic f/u     Discharge Diagnoses:  Principal Problem:   Multiple subsegmental pulmonary emboli without acute cor pulmonale (HCC) Active Problems:   Nephrolithiasis   Chronic combined systolic (congestive) and diastolic (congestive) heart failure (HCC)   Nonspecific chest pain   Unstable angina (HCC)   CKD stage 3a, GFR 45-59 ml/min (HCC)   History of hematuria   Discharge Condition: stable  Diet recommendation: heart healthy  Filed Weights   01/30/24 1351  Weight: 84.4 kg    History of present illness:  From admission h and p Ronnie Mann is a 68 y.o. male with medical history significant of coronary artery disease, chronic systolic congestive heart failure, essential hypertension, dyslipidemia, who came to the hospital with complaints of sudden onset of chest pain.   Patient had a cardiac angiogram a year ago, had a high-grade coronary occlusion in LAD and RCA.  Balloon angioplasty was performed to proximal LAD and a drug-eluting stent placement into proximal RCA.  Echocardiogram performed that time showed ejection fraction 30 to 35%. Patient was admitted to [facility] and discharged last Friday with pneumothorax.  Since that time, patient has been having some right-sided chest pain with deep breath.   Patient had a sudden onset of chest pain localized in the left side chest radiate to the left arm-8:30 AM yesterday.  He took some nitroglycerin, and slept.  He had another episode of chest pain today at 9:30 AM, was described as a sharp, persistent.  10/10 in severity.  Radiate to the left arm.  The pain never got better before he came to the hospital, he still has some moderate pain now.   Patient also complaining short of breath  which has been worse.  Patient has been sleeping in his recliner for many years, he has short of breath with minimal exertion at baseline.  He describes worsening short of breath for the last 3 days.  He also had significant paroxysmal nocturnal dyspnea even he sleeps in the recliner.  He did not have any leg edema, he lost weight.  Hospital Course:  Patient presents with chest pain. Troponins only mildly elevated and stable. Cardiology consulted, does not think this is ACS. VQ scan pursued which shows acute pulmonary embolus (segmental in LLL and subsegmental in RLL). This is likely the source of his pain. CXR does not show recurrence of recent pneumothorax. No hypoxia or dyspnea, clinically euvolemic, does not appear to be in decompensated chf from his PE, and cardiology did not advise further cardiac w/u or adjustments to his home meds. He has a ureteral stent. No hematuria or other bleeding. Patient was started on heparin and monitored overnight, no bleeding/hematuria, and hgb is stable. Discussed case with Dr. Apolinar Junes of urology, she said prior plan for patient to self-discontinue the stent (has a string attached to the stent extending from his urethra) can proceed as planned in about one week. Patient has no hypoxia or dyspnea and so no O2 ordered. We will start the patient on apixaban, he will need PCP f/u. Given his recent urologic procedure and hospitalization, presume this to be a provoked PE. We carefully reviewed bleeding precautions. Denies history of GI or other bleed but is at increased risk. D/c plan reviewed with son.  Procedures: none  Consultations: cardiology  Discharge Exam: Vitals:   02/01/24 0719 02/01/24 1104  BP: 122/60 110/66  Pulse: 63 71  Resp: 20 18  Temp: 98.6 F (37 C) 98.6 F (37 C)  SpO2: 94% 100%    General exam: NAD Respiratory system: Clear to auscultation. Respiratory effort normal. Cardiovascular system: S1 & S2 heard, RR, distant heart  sounds Gastrointestinal system: Abdomen is nondistended, soft and nontender.   Central nervous system: Alert and oriented. No focal neurological deficits. Extremities: Symmetric 5 x 5 power. Skin: No rashes, lesions or ulcers Psychiatry: Judgement and insight appear normal. Mood & affect appropriate.   Discharge Instructions   Discharge Instructions     Diet - low sodium heart healthy   Complete by: As directed    Increase activity slowly   Complete by: As directed    No wound care   Complete by: As directed       Allergies as of 02/01/2024       Reactions   Losartan Other (See Comments)   Chest pain        Medication List     PAUSE taking these medications    spironolactone 25 MG tablet Wait to take this until your doctor or other care provider tells you to start again. Commonly known as: ALDACTONE Take 1 tablet (25 mg total) by mouth daily.       STOP taking these medications    aspirin EC 81 MG tablet   oxyCODONE HCl 10 MG Taba Replaced by: oxyCODONE 5 MG immediate release tablet       TAKE these medications    albuterol 108 (90 Base) MCG/ACT inhaler Commonly known as: VENTOLIN HFA Inhale 2 puffs into the lungs every 6 (six) hours as needed for wheezing or shortness of breath.   apixaban 5 MG Tabs tablet Commonly known as: ELIQUIS 2 tabs twice a day for 7 days, after that 1 tab twice a day   atorvastatin 40 MG tablet Commonly known as: LIPITOR Take 1 tablet (40 mg total) by mouth daily.   furosemide 20 MG tablet Commonly known as: Lasix Take 1 tablet (20 mg total) by mouth daily.   metoprolol succinate 25 MG 24 hr tablet Commonly known as: TOPROL-XL Take 1 tablet (25 mg total) by mouth daily.   nitroGLYCERIN 0.4 MG SL tablet Commonly known as: NITROSTAT Place 0.4 mg under the tongue every 5 (five) minutes as needed for chest pain.   ondansetron 4 MG disintegrating tablet Commonly known as: ZOFRAN-ODT Take 1 tablet (4 mg total) by  mouth every 6 (six) hours as needed for nausea or vomiting.   oxyCODONE 5 MG immediate release tablet Commonly known as: Oxy IR/ROXICODONE Take 1 tablet (5 mg total) by mouth every 6 (six) hours as needed for severe pain (pain score 7-10) (pain). Replaces: oxyCODONE HCl 10 MG Taba   polyethylene glycol 17 g packet Commonly known as: MIRALAX / GLYCOLAX Take 17 g by mouth daily.   sacubitril-valsartan 24-26 MG Commonly known as: Entresto Take 1 tablet by mouth 2 (two) times daily.   solifenacin 10 MG tablet Commonly known as: VESICARE Take 10 mg by mouth daily.   tamsulosin 0.4 MG Caps capsule Commonly known as: FLOMAX Take 1 capsule (0.4 mg total) by mouth daily after supper.   traZODone 50 MG tablet Commonly known as: DESYREL Take 0.5-1 tablets (25-50 mg total) by mouth at bedtime as needed for sleep. What changed: how much to take  Allergies  Allergen Reactions   Losartan Other (See Comments)    Chest pain    Follow-up Information     [facility]. Go on 02/05/2024.   Specialty: Cardiology Why: Hospital Follow-Up 02/05/24 @ 3:00 PM Please bring all medications to follow-up appointment [facility]-Second floor, [address] Free Valet parking at the door Contact information: [address] [place] [address] [phone]        Margarita Mail, DO Follow up.   Specialty: Internal Medicine Contact information: [address] [place] [address] [phone]         your [facility] urologist Follow up.                   The results of significant diagnostics from this hospitalization (including imaging, microbiology, ancillary and laboratory) are listed below for reference.    Significant Diagnostic Studies: NM Pulmonary Perfusion Result Date: 01/31/2024 CLINICAL DATA:  Dyspnea, chronic, chest wall or pleura disease suspected EXAM: NUCLEAR MEDICINE PERFUSION LUNG  SCAN TECHNIQUE: Perfusion images were obtained in multiple projections after intravenous injection of radiopharmaceutical. Ventilation scans intentionally deferred if perfusion scan and chest x-ray adequate for interpretation during COVID 19 epidemic. RADIOPHARMACEUTICALS:  4.47 mCi Tc-49m MAA IV COMPARISON:  X-ray 01/30/2024 FINDINGS: Segmental wedge-shaped perfusion defect in the left lower lobe. Subsegmental perfusion defect at the right lower lobe. Mild cardiomegaly IMPRESSION: Scintigraphic findings suggestive of pulmonary embolism by PISAPED criteria. These results will be called to the ordering clinician or representative by the Radiologist Assistant, and communication documented in the PACS or [employer]. Electronically Signed   By: Duanne Guess D.O.   On: 01/31/2024 13:32   DG Chest Port 1 View Result Date: 01/30/2024 CLINICAL DATA:  Chest pain. EXAM: PORTABLE CHEST 1 VIEW COMPARISON:  Chest radiograph dated 12/03/2023. FINDINGS: No focal consolidation, pleural effusion, or pneumothorax. Minimal left lung base atelectasis. The cardiac silhouette is within normal limits. Moderate size hiatal hernia. No acute osseous pathology. IMPRESSION: 1. No active disease. 2. Moderate size hiatal hernia. Electronically Signed   By: Elgie Collard M.D.   On: 01/30/2024 16:34    Microbiology: No results found for this or any previous visit (from the past 240 hours).   Labs: Basic Metabolic Panel: Recent Labs  Lab 01/30/24 1354 01/31/24 0504 02/01/24 0313  NA 135 136 135  K 4.4 4.2 4.6  CL 107 106 105  CO2 20* 23 24  GLUCOSE 194* 141* 109*  BUN 46* 45* 39*  CREATININE 1.64* 1.57* 1.53*  CALCIUM 9.3 8.8* 9.0  MG  --  2.2  --    Liver Function Tests: No results for input(s): "AST", "ALT", "ALKPHOS", "BILITOT", "PROT", "ALBUMIN" in the last 168 hours. No results for input(s): "LIPASE", "AMYLASE" in the last 168 hours. No results for input(s): "AMMONIA" in the last 168  hours. CBC: Recent Labs  Lab 01/30/24 1354 01/31/24 0504 02/01/24 0313  WBC 6.0 5.5 5.0  HGB 10.1* 9.3* 9.1*  HCT 31.0* 28.1* 28.5*  MCV 95.7 94.3 94.7  PLT 214 191 204   Cardiac Enzymes: No results for input(s): "CKTOTAL", "CKMB", "CKMBINDEX", "TROPONINI" in the last 168 hours. BNP: BNP (last 3 results) Recent Labs    12/03/23 0057 12/07/23 1333 01/30/24 1354  BNP 270.3* 1,051.7* 298.2*    ProBNP (last 3 results) Recent Labs    12/21/23 1421  PROBNP 1,262*    CBG: No results for input(s): "GLUCAP" in the last 168 hours.  Signed:  Silvano Bilis MD.  [facility] 02/01/2024, 12:21 PM

## 2024-02-01 NOTE — Telephone Encounter (Signed)
 Patient Product/process development scientist completed.    The patient is insured through [employer]. Patient has Medicare and is not eligible for a copay card, but may be able to apply for patient assistance or Medicare RX Payment Plan (Patient Must reach out to their plan, if eligible for payment plan), if available.    Ran test claim for Eliquis 5 mg and the current 30 day co-pay is $0.00.  Ran test claim for Xarelto 20 mg and the current 30 day co-pay is $0.00.  This test claim was processed through [facility]- copay amounts may vary at other pharmacies due to pharmacy/plan contracts, or as the patient moves through the different stages of their insurance plan.     Roland Earl, CPHT Pharmacy Technician III Certified Patient Advocate [facility] Patient Advocate Team Direct Number: [phone]  Fax: [phone]

## 2024-02-01 NOTE — TOC Transition Note (Signed)
 Transition of Care [facility]) - Discharge Note   Patient Details  Name: Ronnie Mann MRN: [id number] Date of Birth: [birth year]  [facility]) CM/SW Contact:  Truddie Hidden, RN Phone Number: 02/01/2024, 3:38 PM   Clinical Narrative:    Patient is active with [facility].   Sarah from [facility] notified of discharge.   TOC signing off.           Patient Goals and CMS Choice            Discharge Placement                       Discharge Plan and Services Additional resources added to the After Visit Summary for                                       Social Drivers of Health (SDOH) Interventions SDOH Screenings   Food Insecurity: Food Insecurity Present (02/01/2024)  Housing: Low Risk  (02/01/2024)  Transportation Needs: No Transportation Needs (01/31/2024)  Utilities: At Risk (01/31/2024)  Alcohol Screen: Low Risk  (11/27/2023)  Depression (PHQ2-9): Low Risk  (11/27/2023)  Financial Resource Strain: Low Risk  (01/23/2024)   Received from [facility]  Social Connections: Socially Isolated (01/31/2024)  Tobacco Use: Low Risk  (02/01/2024)     Readmission Risk Interventions    11/06/2023   12:27 PM  Readmission Risk Prevention Plan  Transportation Screening Complete  PCP or Specialist Appt within 3-5 Days Complete  HRI or Home Care Consult Complete  Social Work Consult for Recovery Care Planning/Counseling Complete  Palliative Care Screening Not Applicable

## 2024-02-01 NOTE — Progress Notes (Signed)
 Heart Failure Nurse Navigator Progress Note  PCP: Margarita Mail, DO PCP-Cardiologist: Antonieta Iba MD Admission Diagnosis: Nonspecific chest pain Admitted from: Home via [facility]  Presentation:   [name] presented with chest pain that is worse with breathing, left arm pain, right flank pain. Recent admission [facility] 01/22/24-01/27/24 and urologic procedure with ureteral stent placement. Complicated by intraoperative pneumothorax requiring right-sided chest tube placement and IV infiltration with concern for right forearm necrosis. BNP 298.   ECHO/ LVEF: 25-30%-10/13/23  Clinical Course:  Past Medical History:  Diagnosis Date   Coronary artery disease 05/01/2023   PCI/DES   HFrEF (heart failure with reduced ejection fraction) (HCC) 05/01/2023   Hiatal hernia    HLD (hyperlipidemia)    HTN (hypertension)    Ischemic cardiomyopathy 05/01/2023   LVEF 30-35%   Kidney stones    Pneumothorax      Social History   Socioeconomic History   Marital status: Divorced    Spouse name: Not on file   Number of children: Not on file   Years of education: Not on file   Highest education level: 10th grade  Occupational History   Not on file  Tobacco Use   Smoking status: Never    Passive exposure: Never   Smokeless tobacco: Never  Vaping Use   Vaping status: Never Used  Substance and Sexual Activity   Alcohol use: Never   Drug use: Yes    Types: Marijuana   Sexual activity: Not on file  Other Topics Concern   Not on file  Social History Narrative   Not on file   Social Drivers of Health   Financial Resource Strain: Low Risk  (01/23/2024)   Received from [facility]   Overall Financial Resource Strain (CARDIA)    Difficulty of Paying Living Expenses: Not hard at all  Food Insecurity: Food Insecurity Present (01/31/2024)   Hunger Vital Sign    Worried About Running Out of Food in the Last Year: Often true    Ran Out of Food in the Last Year: Often true  Transportation  Needs: No Transportation Needs (01/31/2024)   PRAPARE - Administrator, Civil Service (Medical): No    Lack of Transportation (Non-Medical): No  Physical Activity: Not on file  Stress: Not on file  Social Connections: Socially Isolated (01/31/2024)   Social Connection and Isolation Panel [NHANES]    Frequency of Communication with Friends and Family: Once a week    Frequency of Social Gatherings with Friends and Family: Never    Attends Religious Services: Never    Diplomatic Services operational officer: No    Attends Engineer, structural: Never    Marital Status: Divorced   Water engineer and Provision:  Detailed education and instructions reviewed on heart failure disease management including the following:  Signs and symptoms of Heart Failure When to call the physician Importance of daily weights Low sodium diet Fluid restriction Medication management Anticipated future follow-up appointments  Patient education given on each of the above topics.  Patient acknowledges understanding via teach back method and acceptance of all instructions.  Education Materials:  "Living Better With Heart Failure" Booklet, HF zone tool, & Daily Weight Tracker Tool.  Patient has scale at home: Yes Patient has pill box at home: Yes    High Risk Criteria for Readmission and/or Poor Patient Outcomes: Heart failure hospital admissions (last 6 months): 1  No Show rate: 12 Difficult social situation: None Demonstrates medication adherence:  Yes Primary Language: English Literacy level: Reading, Writing & Comprehension  Barriers of Care:   None  Considerations/Referrals:   Referral made to Heart Failure Pharmacist Stewardship: Yes Referral made to Heart Failure CSW/NCM TOC: No Referral made to Heart & Vascular clinic: Yes. Established Advanced Heart Failure patient follow-up already scheduled for 02/05/24 @ 3:00 PM   Items for Follow-up on DC: Diet & Fluid  Restrictions Daily Weights Continued Heart Failure Education  Roxy Horseman, RN, BSN [facility] Heart Failure Navigator Secure Chat Only

## 2024-02-01 NOTE — Progress Notes (Signed)
 Heart Failure Stewardship Pharmacy Note  PCP: Margarita Mail, DO PCP-Cardiologist: Julien Nordmann, MD  HPI: Ronnie Mann is a 68 y.o. male with taghorn calculus, left hydronephrosis, CHF with EF 30-35%, HTN, HLD, CAD with NSTEMI s/p DES, CKD-3a, anemia, hiatal hernia, obesity who presented with sudden onset chest pain. Recently underwent R percutaneous nephrolithotomy and ureteral stent placement on 01/22/2024 at [facility] that was complicated by intraoperative pneumothorax requiring right-sided chest tube placement and vasopressors, which infiltrated into his forearm, requiring transfer to [facility]. Noted to have chest pain, elevated troponin in 700s, and EKG changes during that admission which resolved.   This admission, troponin was 91 > 86. BNP was 298.2. CXR showed no active disease. V/Q scan suggestive of PE.   Pertinent cardiac history: NSTEMI in 04/2023 s/p PCI to proximal LAD and RCA on 05/01/23. Echo at that time showed LVEF 30-35% with grade II diastolic dysfunction, moderate MR. Echo in 09/2023 noted LVEF 25-30% with low normal RV function, and moderate MR.  Pertinent Lab Values: Creatinine, Ser  Date Value Ref Range Status  02/01/2024 1.53 (H) 0.61 - 1.24 mg/dL Final   BUN  Date Value Ref Range Status  02/01/2024 39 (H) 8 - 23 mg/dL Final  40/98/1191 30 (H) 8 - 27 mg/dL Final   Potassium  Date Value Ref Range Status  02/01/2024 4.6 3.5 - 5.1 mmol/L Final   Sodium  Date Value Ref Range Status  02/01/2024 135 135 - 145 mmol/L Final  12/21/2023 138 134 - 144 mmol/L Final   B Natriuretic Peptide  Date Value Ref Range Status  01/30/2024 298.2 (H) 0.0 - 100.0 pg/mL Final    Comment:    Performed at [facility], [address] [place], [place] [address]   Magnesium  Date Value Ref Range Status  01/31/2024 2.2 1.7 - 2.4 mg/dL Final    Comment:    Performed at [facility], [address] [place], [place] [address]   Hgb A1c MFr  Bld  Date Value Ref Range Status  10/10/2023 6.1 (H) 4.8 - 5.6 % Final    Comment:    (NOTE) Pre diabetes:          5.7%-6.4%  Diabetes:              >6.4%  Glycemic control for   <7.0% adults with diabetes    TSH  Date Value Ref Range Status  04/28/2023 4.483 0.350 - 4.500 uIU/mL Final    Comment:    Performed by a 3rd Generation assay with a functional sensitivity of <=0.01 uIU/mL. Performed at [facility], [address] [place], [place] [address]    Vital Signs: Temp:  [97.5 F (36.4 C)-98.6 F (37 C)] 98.6 F (37 C) (03/13 1104) Pulse Rate:  [60-71] 71 (03/13 1104) Cardiac Rhythm: Ventricular tachycardia (03/13 0915) Resp:  [18-20] 18 (03/13 1104) BP: (97-133)/(50-80) 110/66 (03/13 1104) SpO2:  [94 %-100 %] 100 % (03/13 1104)  Intake/Output Summary (Last 24 hours) at 02/01/2024 1128 Last data filed at 02/01/2024 0900 Gross per 24 hour  Intake 614.04 ml  Output 1185 ml  Net -570.96 ml    Current Heart Failure Medications:  Loop diuretic: none Beta-Blocker: metoprolol succinate 12.5 mg daily ACEI/ARB/ARNI: none MRA: none SGLT2i: none  Prior to admission Heart Failure Medications:  Loop diuretic: furosemide 20 mg daily Beta-Blocker: metoprolol succinate 25 mg daily ACEI/ARB/ARNI: Entresto 24-26 mg bid MRA: none SGLT2i: none  Assessment: 1. Acute on chronic combined  systolic and diastolic heart failure (LVEF 30-35%) with grade II diastolic dysfunction, due to ICM. NYHA class III symptoms.  -Symptoms: Reports chest pain, shortness of breath, kidney pain, and fatigue. Symptoms are not likely due to CHF given low BNP on admission and other diagnoses to explain symptoms. -Volume:  Appears euvolemic. Weight on admission was down significantly from last clinic visit. This is likely more so a reflection of Mr. Grabe chronically poor appetite with renal stone pain/nausea. BNP on admission was not significantly elevated in light of PTA Entresto use. Would  consider resuming his home oral diuretics to maintain euvolemia. -Hemodynamics:  BP is soft. HR 60-70s. -BB: Continue metoprolol succinate to 12.5 mg daily. -ACEI/ARB/ARNI: Entresto on hold due to hypotension. Can consider resuming prior to discharge. -MRA: Can consider restarting MRA eventually, pending potassium and creatinine trends.  -SGLT2i: Not a candidate for this class given chronic uretal obstruction and UTIs.  -Receiving heparin for PE.  Plan: 1) Medication changes recommended at this time: - Can consider restarting home furosemide 20 mg daily tomorrow   2) Patient assistance: -Pending  3) Education: - Patient has been educated on current HF medications and potential additions to HF medication regimen - Patient verbalizes understanding that over the next few months, these medication doses may change and more medications may be added to optimize HF regimen - Patient has been educated on basic disease state pathophysiology and goals of therapy  Medication Assistance / Insurance Benefits Check: Does the patient have prescription insurance?    Type of insurance plan:  Does the patient qualify for medication assistance through manufacturers or grants? Pending   Outpatient Pharmacy: Prior to admission outpatient pharmacy: [facility]      Please do not hesitate to reach out with questions or concerns,  Enos Fling, PharmD, CPP, BCPS Heart Failure Pharmacist  Phone - [phone] 02/01/2024 11:28 AM

## 2024-02-01 NOTE — Plan of Care (Signed)

## 2024-02-01 NOTE — Consult Note (Signed)
 PHARMACY - ANTICOAGULATION CONSULT NOTE  Pharmacy Consult for Heparin  Indication: chest pain/ACS  Allergies  Allergen Reactions   Losartan Other (See Comments)    Chest pain    Patient Measurements: Height: 5\' 9"  (175.3 cm) Weight: 84.4 kg (186 lb) IBW/kg (Calculated) : 70.7 Heparin Dosing Weight: 84.4 kg  Vital Signs: Temp: 98.4 F (36.9 C) (03/13 0351) Temp Source: Oral (03/13 0351) BP: 122/66 (03/13 0351) Pulse Rate: 65 (03/13 0351)  Labs: Recent Labs    01/30/24 1354 01/30/24 1532 01/30/24 1811 01/30/24 2312 01/31/24 0504 01/31/24 2104 02/01/24 0313  HGB 10.1*  --   --   --  9.3*  --  9.1*  HCT 31.0*  --   --   --  28.1*  --  28.5*  PLT 214  --   --   --  191  --  204  APTT  --   --  28  --   --   --   --   LABPROT  --   --  13.7  --   --   --   --   INR  --   --  1.0  --   --   --   --   HEPARINUNFRC  --   --   --    < > 0.53 0.35 0.41  CREATININE 1.64*  --   --   --  1.57*  --  1.53*  TROPONINIHS 91* 86*  --   --   --   --   --    < > = values in this interval not displayed.    Estimated Creatinine Clearance: 46.9 mL/min (A) (by C-G formula based on SCr of 1.53 mg/dL (H)).   Medical History: Past Medical History:  Diagnosis Date   Coronary artery disease 05/01/2023   PCI/DES   HFrEF (heart failure with reduced ejection fraction) (HCC) 05/01/2023   Hiatal hernia    HLD (hyperlipidemia)    HTN (hypertension)    Ischemic cardiomyopathy 05/01/2023   LVEF 30-35%   Kidney stones    Pneumothorax     Medications:  Medications Prior to Admission  Medication Sig Dispense Refill Last Dose/Taking   albuterol (VENTOLIN HFA) 108 (90 Base) MCG/ACT inhaler Inhale 2 puffs into the lungs every 6 (six) hours as needed for wheezing or shortness of breath. 17 each 0 Taking As Needed   aspirin EC 81 MG tablet Take 81 mg by mouth daily. Swallow whole.   01/29/2024   atorvastatin (LIPITOR) 40 MG tablet Take 1 tablet (40 mg total) by mouth daily. 90 tablet 3  01/29/2024   furosemide (LASIX) 20 MG tablet Take 1 tablet (20 mg total) by mouth daily. 90 tablet 3 01/29/2024   metoprolol succinate (TOPROL-XL) 25 MG 24 hr tablet Take 1 tablet (25 mg total) by mouth daily. 90 tablet 3 01/29/2024   nitroGLYCERIN (NITROSTAT) 0.4 MG SL tablet Place 0.4 mg under the tongue every 5 (five) minutes as needed for chest pain.   01/29/2024   ondansetron (ZOFRAN-ODT) 4 MG disintegrating tablet Take 1 tablet (4 mg total) by mouth every 6 (six) hours as needed for nausea or vomiting. 20 tablet 0 Taking As Needed   polyethylene glycol (MIRALAX / GLYCOLAX) 17 g packet Take 17 g by mouth daily. 30 each 0 Taking   sacubitril-valsartan (ENTRESTO) 24-26 MG Take 1 tablet by mouth 2 (two) times daily. 180 tablet 2 01/29/2024   solifenacin (VESICARE) 10 MG tablet Take 10 mg by  mouth daily.   01/29/2024   tamsulosin (FLOMAX) 0.4 MG CAPS capsule Take 1 capsule (0.4 mg total) by mouth daily after supper. 30 capsule 2 01/29/2024   traZODone (DESYREL) 50 MG tablet Take 0.5-1 tablets (25-50 mg total) by mouth at bedtime as needed for sleep. (Patient taking differently: Take 50 mg by mouth at bedtime as needed for sleep.) 90 tablet 0 01/29/2024   oxyCODONE (OXY IR/ROXICODONE) 5 MG immediate release tablet Take 10 mg by mouth every 4 (four) hours as needed for severe pain (pain score 7-10). (Patient not taking: Reported on 01/30/2024)   Not Taking   [Paused] spironolactone (ALDACTONE) 25 MG tablet Take 1 tablet (25 mg total) by mouth daily. (Patient not taking: Reported on 01/04/2024) 45 tablet 3    Scheduled:   aspirin EC  81 mg Oral Daily   atorvastatin  40 mg Oral Daily   metoprolol succinate  12.5 mg Oral Daily   polyethylene glycol  17 g Oral Daily   tamsulosin  0.4 mg Oral QPC supper   Infusions:   heparin 1,150 Units/hr (01/31/24 1443)   PRN: albuterol, morphine injection, ondansetron (ZOFRAN) IV, oxyCODONE, traZODone  Assessment: Patient presented with chest pain and worse with  breathing. PMH possibly includes CHF, HTN, CAD, CKD.  Trop 86. Pharmacy consulted to start heparin. No DOAC PTA. CBC stable.   Goal of Therapy:  Heparin level 0.3-0.7 units/ml Monitor platelets by anticoagulation protocol: Yes   Plan:  3/13:  HL @ 0313 = 0.41, therapeutic X 4  - Will continue pt on current rate and recheck HL on 3/14 with AM labs.  Daily CBC while on heparin  Inis Borneman D, PharmD 02/01/2024,4:18 AM

## 2024-02-02 ENCOUNTER — Telehealth: Payer: Self-pay | Admitting: Family

## 2024-02-02 NOTE — Telephone Encounter (Signed)
 Vm not setup unable to confirm appt 02/05/24

## 2024-02-04 NOTE — Progress Notes (Unsigned)
 Advanced Heart Failure Clinic Note    PCP: Margarita Mail, DO (last seen 01/25) Primary Cardiologist: Julien Nordmann, MD (saw during 06/24 admission)  Chief Complaint: urinating blood and blood clots   HPI:  Mr Piper is a 68 y/o male with a history of NSTEMI, s/p PCI to proximal LAD and RCA on 05/01/23, HFrEF(EF 30 to 35%), previous tobacco/ alcohol use, HTN, CKD, UTI, hyperlipidemia, kidney stones since the age of 58, right uretal stone s/p stent 10/12/23, bilateral nephrolithiasis and pulmonary embolus (03/25).   Admitted 04/28/23 due to new onset of substernal "vice like" substernal chest pain that radiated to his left arm. He reports ongoing shortness of breath associated with this. He also was awoken from sleep with this last night and had broken out into a cold sweat. Elevated lactic acid and elevated WBC at 14.4. 1 L of IVF given. Initial troponin was 54 rose to 234. 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 Developed flash pulmonary edema during left heart cath 06/10 and was treated with nitroglycerin drip. Diuretics held. Status post successful balloon angioplasty to the proximal LAD and drug-eluting stent placement to the proximal right coronary artery. Treated for pneumonia with antibiotics. Was in the ED 05/13/23 due to bilateral flank and suprapubic pain. CT renal was stable. Admitted 05/16/23 due to shortness of breath, bilateral flank pain radiating towards lower abdomen, more on right than left, dysuria and gross hematuria. Patient deferred urology stent placement. Able to void after foley removed. Brilinta was switched to Plavix, aspirin discontinued in the setting of hematuria by cardiologist    Admitted 10/10/23 due to shortness of breath, hematuria and flank pain. Started on IV lasix for HF exacerbation. Cardiology consulted and feel respiratory symptoms more related to bronchitis. Lasix held and solu-medrol provided. Respiratory panel  negative. Urology consulted and stent placed. Losartan was changed to entresto.   Admitted 11/04/23 due to right flank pain. RUQ sono showed cholelithiasis with positive sonographic Murphy's borderline thickening of gallbladder wall at fundus suspicious for acute cholecystitis.  CT renal stone study showed right ureteral stent in position, persistent calculi in right ureter, pelvis and proximal mid left ureter.  Surgery team evaluated the patient in the ED, advised HIDA scan which ruled out acute cholecystitis. Entresto, lasix and spironolactone held due to low BP. To follow up with [facility] urology early January 2025.  Has had 2 ED visits 01/25 due to pain from his kidney stone  Had PCNL and ureteral stent placed 01/22/24 complicated by intraoperative pneumothorax requiring right-sided chest tube placement with Interventional Radiology on 01/22/2024 at [facility]. He also received pressors intraoperatively for which IV was infiltrated with concern for right forearm necrosis. Had episode of sudden chest pain radiating to the left arm. The patient had an equivocal EKG with possible ST elevations and Troponins in the 700s. Cardiology service was consulted for their recommendations, and there was no acute intervention for cath lab. Troponins peaked later that night to the 2000s and later downtrended to 1800s. On POD4 the pigtail chest tube was removed with stable CXR, post-pull chest XR was stable.     Admitted 01/30/24 with sudden onset of chest pain localized in the left side chest radiate to the left arm-8:30 AM yesterday. He took some nitroglycerin, and slept. He had another episode of chest pain today at 9:30 AM, was described as a sharp, persistent. 10/10 in severity. Radiate to the left arm. Worsening shortness of breath where he has to sleep  in the recliner. Troponins only mildly elevated and stable. Cardiology consulted, does not think this is ACS. VQ scan pursued which shows acute pulmonary  embolus (segmental in LLL and subsegmental in RLL. Heparin drip started. Urology consulted. Started on apixaban.   He presents today for a HF f/u visit with a chief complaint of urinating bright red blood along with multiple blood clots. Also reporting severe pain after recent PCNL and stent placement. + nausea & dizziness.    Previous cardiac studies:   Echo 04/30/23: EF 30-35% along with mild LVH and Grade II DD and moderate MR.  Echo 10/13/23: EF 25-30% with moderate LAE, moderate MR  LHC 05/01/23:   Prox LAD to Mid LAD lesion is 99% stenosed.   Mid LAD lesion is 60% stenosed.   1st Diag lesion is 70% stenosed.   Prox RCA lesion is 95% stenosed.   Mid RCA lesion is 30% stenosed.   RPDA lesion is 60% stenosed.   Dist LAD lesion is 99% stenosed.   A drug-eluting stent was successfully placed using a STENT ONYX FRONTIER 4.0X15.   Balloon angioplasty was performed using a BALLN [employer] EUPHORA RX 2.75X20.   Post intervention, there is a 20% residual stenosis.   Post intervention, there is a 0% residual stenosis.   There is severe left ventricular systolic dysfunction.   LV end diastolic pressure is severely elevated.   There is moderate (3+) mitral regurgitation.  1.  Severe two-vessel coronary artery disease with subtotal occlusion of the proximal LAD, diffuse moderate mid LAD disease and severe distal LAD disease.  In addition, there is 95% in the proximal right coronary artery with faint right to left collaterals to the LAD.  The left circumflex has mild nonobstructive disease and OM 3 is large and reaches the apex. 2.  Severely reduced LV systolic function with an EF of 25%.  Severely elevated left ventricular end-diastolic pressure at 34 mmHg. 3.  Successful balloon angioplasty to the proximal LAD and drug-eluting stent placement to the proximal right coronary artery.    ROS: All systems negative except as listed in HPI, PMH and Problem List.  SH:  Social History   Socioeconomic  History   Marital status: Divorced    Spouse name: Not on file   Number of children: Not on file   Years of education: Not on file   Highest education level: 10th grade  Occupational History   Not on file  Tobacco Use   Smoking status: Never    Passive exposure: Never   Smokeless tobacco: Never  Vaping Use   Vaping status: Never Used  Substance and Sexual Activity   Alcohol use: Never   Drug use: Yes    Types: Marijuana   Sexual activity: Not on file  Other Topics Concern   Not on file  Social History Narrative   Not on file   Social Drivers of Health   Financial Resource Strain: Low Risk  (01/23/2024)   Received from [facility]   Overall Financial Resource Strain (CARDIA)    Difficulty of Paying Living Expenses: Not hard at all  Food Insecurity: Food Insecurity Present (02/01/2024)   Hunger Vital Sign    Worried About Running Out of Food in the Last Year: Often true    Ran Out of Food in the Last Year: Often true  Transportation Needs: No Transportation Needs (01/31/2024)   PRAPARE - Administrator, Civil Service (Medical): No    Lack of Transportation (  Non-Medical): No  Physical Activity: Not on file  Stress: Not on file  Social Connections: Socially Isolated (01/31/2024)   Social Connection and Isolation Panel [NHANES]    Frequency of Communication with Friends and Family: Once a week    Frequency of Social Gatherings with Friends and Family: Never    Attends Religious Services: Never    Database administrator or Organizations: No    Attends Banker Meetings: Never    Marital Status: Divorced  Catering manager Violence: Not At Risk (01/31/2024)   Humiliation, Afraid, Rape, and Kick questionnaire    Fear of Current or Ex-Partner: No    Emotionally Abused: No    Physically Abused: No    Sexually Abused: No    FH:  Family History  Problem Relation Age of Onset   Heart disease Father     Past Medical History:  Diagnosis Date    Coronary artery disease 05/01/2023   PCI/DES   HFrEF (heart failure with reduced ejection fraction) (HCC) 05/01/2023   Hiatal hernia    HLD (hyperlipidemia)    HTN (hypertension)    Ischemic cardiomyopathy 05/01/2023   LVEF 30-35%   Kidney stones    Pneumothorax     Current Outpatient Medications  Medication Sig Dispense Refill   albuterol (VENTOLIN HFA) 108 (90 Base) MCG/ACT inhaler Inhale 2 puffs into the lungs every 6 (six) hours as needed for wheezing or shortness of breath. 17 each 0   apixaban (ELIQUIS) 5 MG TABS tablet Take 2 tablets (10 mg total) by mouth 2 (two) times daily for 7 days, THEN 1 tablet (5 mg total) 2 (two) times daily. 120 tablet 1   atorvastatin (LIPITOR) 40 MG tablet Take 1 tablet (40 mg total) by mouth daily. 90 tablet 3   furosemide (LASIX) 20 MG tablet Take 1 tablet (20 mg total) by mouth daily. 90 tablet 3   metoprolol succinate (TOPROL-XL) 25 MG 24 hr tablet Take 1 tablet (25 mg total) by mouth daily. 90 tablet 3   nitroGLYCERIN (NITROSTAT) 0.4 MG SL tablet Place 0.4 mg under the tongue every 5 (five) minutes as needed for chest pain.     ondansetron (ZOFRAN-ODT) 4 MG disintegrating tablet Take 1 tablet (4 mg total) by mouth every 6 (six) hours as needed for nausea or vomiting. 20 tablet 0   oxyCODONE (OXY IR/ROXICODONE) 5 MG immediate release tablet Take 1 tablet (5 mg total) by mouth every 6 (six) hours as needed for severe pain (pain score 7-10) (pain). 15 tablet 0   polyethylene glycol (MIRALAX / GLYCOLAX) 17 g packet Take 17 g by mouth daily. 30 each 0   sacubitril-valsartan (ENTRESTO) 24-26 MG Take 1 tablet by mouth 2 (two) times daily. 180 tablet 2   solifenacin (VESICARE) 10 MG tablet Take 10 mg by mouth daily.     [Paused] spironolactone (ALDACTONE) 25 MG tablet Take 1 tablet (25 mg total) by mouth daily. (Patient not taking: Reported on 01/04/2024) 45 tablet 3   tamsulosin (FLOMAX) 0.4 MG CAPS capsule Take 1 capsule (0.4 mg total) by mouth daily after  supper. 30 capsule 2   traZODone (DESYREL) 50 MG tablet Take 0.5-1 tablets (25-50 mg total) by mouth at bedtime as needed for sleep. (Patient taking differently: Take 50 mg by mouth at bedtime as needed for sleep.) 90 tablet 0   No current facility-administered medications for this visit.   Vitals:   02/05/24 1524  BP: 109/65  Pulse: 66  SpO2: 100%  Weight: 189 lb (85.7 kg)   Wt Readings from Last 3 Encounters:  02/05/24 189 lb (85.7 kg)  01/30/24 186 lb (84.4 kg)  01/04/24 201 lb 9.6 oz (91.4 kg)   Lab Results  Component Value Date   CREATININE 1.53 (H) 02/01/2024   CREATININE 1.57 (H) 01/31/2024   CREATININE 1.64 (H) 01/30/2024    PHYSICAL EXAM:  General: Uncomfortable appearing with grayish color; bent over at the waist due to pain. No resp difficulty HEENT: normal Extremities: no cyanosis, clubbing, rash, edema Neuro: alert & oriented X 3. Moves all 4 extremities w/o difficulty. Affect pleasant although in obvious pain with intermittent groaning   ECG: not done   ASSESSMENT & PLAN:  1: Ischemic heart failure with reduced ejection fraction- - NSTEMI with PCI 06/24 - NYHA class II - euvolemic - weight down 12 pounds from last visit here 1 month ago - Echo 04/30/23: EF 30-35% along with mild LVH and Grade II DD and moderate MR.  - Echo 10/13/23: EF 25-30% with moderate LAE, moderate MR - continue furosemide 20mg  daily - continue metoprolol succinate 25mg  daily - continue entresto 24/26mg  BID - MRA still on hold but will consider adding at future visit - due to chronic kidney stone; may not be a good candidate for SGLT2 - BNP 01/30/24 was 298.2  2: CAD- - was NS with cardiology Mariah Milling) 08/24; this needs R/S but his priority is urology issues right now - continue atorvastatin 40mg  daily - LHC 05/01/23:   Prox LAD to Mid LAD lesion is 99% stenosed.   Mid LAD lesion is 60% stenosed.   1st Diag lesion is 70% stenosed.   Prox RCA lesion is 95% stenosed.   Mid RCA  lesion is 30% stenosed.   RPDA lesion is 60% stenosed.   Dist LAD lesion is 99% stenosed.   A drug-eluting stent was successfully placed using a STENT ONYX FRONTIER 4.0X15.   Balloon angioplasty was performed using a BALLN Gonzales EUPHORA RX 2.75X20.   Post intervention, there is a 20% residual stenosis.   Post intervention, there is a 0% residual stenosis.   There is severe left ventricular systolic dysfunction.   LV end diastolic pressure is severely elevated.   There is moderate (3+) mitral regurgitation.  1.  Severe two-vessel coronary artery disease with subtotal occlusion of the proximal LAD, diffuse moderate mid LAD disease and severe distal LAD disease.  In addition, there is 95% in the proximal right coronary artery with faint right to left collaterals to the LAD.  The left circumflex has mild nonobstructive disease and OM 3 is large and reaches the apex. 2.  Severely reduced LV systolic function with an EF of 25%.  Severely elevated left ventricular end-diastolic pressure at 34 mmHg. 3.  Successful balloon angioplasty to the proximal LAD and drug-eluting stent placement to the proximal right coronary artery.  3: HTN- - BP 109/65 - saw PCP Caralee Ates) 01/25 - BMP 02/01/24 showed sodium 135, potassium 4.6, creatinine 1.53 & GFR 50  4: Kidney stones (managed by urology)- - followed by [facility] urology (last seen 01/25) - has bilateral staghorn renal stones  - right ureteral stent placed 10/12/23  - PCNL and urethral stent done 01/22/24 with subsequent complication of pneumothorax - now with hematuria, passing blood clots and in uncontrolled pain - on apixaban due to recent PE diagnosis - patient taken to ER via wheelchair for further evaluation - may benefit from pain clinic referral   Follow-up here pending ED disposition.  Delma Freeze, FNP 02/04/24

## 2024-02-05 ENCOUNTER — Encounter: Payer: Self-pay | Admitting: Emergency Medicine

## 2024-02-05 ENCOUNTER — Emergency Department

## 2024-02-05 ENCOUNTER — Other Ambulatory Visit: Payer: Self-pay

## 2024-02-05 ENCOUNTER — Ambulatory Visit (HOSPITAL_BASED_OUTPATIENT_CLINIC_OR_DEPARTMENT_OTHER): Admitting: Family

## 2024-02-05 ENCOUNTER — Emergency Department
Admission: EM | Admit: 2024-02-05 | Discharge: 2024-02-05 | Disposition: A | Discharge status: Home / Self Care | Attending: Emergency Medicine | Admitting: Emergency Medicine

## 2024-02-05 ENCOUNTER — Encounter: Payer: Self-pay | Admitting: Family

## 2024-02-05 VITALS — BP 109/65 | HR 66 | Wt 189.0 lb

## 2024-02-05 DIAGNOSIS — I252 Old myocardial infarction: Secondary | ICD-10-CM | POA: Insufficient documentation

## 2024-02-05 DIAGNOSIS — Z8249 Family history of ischemic heart disease and other diseases of the circulatory system: Secondary | ICD-10-CM | POA: Diagnosis not present

## 2024-02-05 DIAGNOSIS — R1031 Right lower quadrant pain: Secondary | ICD-10-CM | POA: Insufficient documentation

## 2024-02-05 DIAGNOSIS — N132 Hydronephrosis with renal and ureteral calculous obstruction: Secondary | ICD-10-CM | POA: Insufficient documentation

## 2024-02-05 DIAGNOSIS — K449 Diaphragmatic hernia without obstruction or gangrene: Secondary | ICD-10-CM | POA: Diagnosis not present

## 2024-02-05 DIAGNOSIS — N200 Calculus of kidney: Secondary | ICD-10-CM

## 2024-02-05 DIAGNOSIS — Z7901 Long term (current) use of anticoagulants: Secondary | ICD-10-CM | POA: Insufficient documentation

## 2024-02-05 DIAGNOSIS — I10 Essential (primary) hypertension: Secondary | ICD-10-CM

## 2024-02-05 DIAGNOSIS — I2510 Atherosclerotic heart disease of native coronary artery without angina pectoris: Secondary | ICD-10-CM | POA: Diagnosis not present

## 2024-02-05 DIAGNOSIS — K8020 Calculus of gallbladder without cholecystitis without obstruction: Secondary | ICD-10-CM | POA: Insufficient documentation

## 2024-02-05 DIAGNOSIS — Z79899 Other long term (current) drug therapy: Secondary | ICD-10-CM | POA: Insufficient documentation

## 2024-02-05 DIAGNOSIS — Z87442 Personal history of urinary calculi: Secondary | ICD-10-CM | POA: Insufficient documentation

## 2024-02-05 DIAGNOSIS — I130 Hypertensive heart and chronic kidney disease with heart failure and stage 1 through stage 4 chronic kidney disease, or unspecified chronic kidney disease: Secondary | ICD-10-CM | POA: Insufficient documentation

## 2024-02-05 DIAGNOSIS — E785 Hyperlipidemia, unspecified: Secondary | ICD-10-CM | POA: Diagnosis not present

## 2024-02-05 DIAGNOSIS — R310 Gross hematuria: Secondary | ICD-10-CM | POA: Diagnosis present

## 2024-02-05 DIAGNOSIS — I5022 Chronic systolic (congestive) heart failure: Secondary | ICD-10-CM | POA: Insufficient documentation

## 2024-02-05 DIAGNOSIS — R0789 Other chest pain: Secondary | ICD-10-CM | POA: Insufficient documentation

## 2024-02-05 DIAGNOSIS — I5040 Unspecified combined systolic (congestive) and diastolic (congestive) heart failure: Secondary | ICD-10-CM | POA: Insufficient documentation

## 2024-02-05 DIAGNOSIS — Z955 Presence of coronary angioplasty implant and graft: Secondary | ICD-10-CM | POA: Diagnosis not present

## 2024-02-05 DIAGNOSIS — I340 Nonrheumatic mitral (valve) insufficiency: Secondary | ICD-10-CM | POA: Insufficient documentation

## 2024-02-05 DIAGNOSIS — Z86711 Personal history of pulmonary embolism: Secondary | ICD-10-CM | POA: Insufficient documentation

## 2024-02-05 DIAGNOSIS — R1032 Left lower quadrant pain: Secondary | ICD-10-CM | POA: Insufficient documentation

## 2024-02-05 DIAGNOSIS — I700 Atherosclerosis of aorta: Secondary | ICD-10-CM | POA: Diagnosis not present

## 2024-02-05 DIAGNOSIS — N183 Chronic kidney disease, stage 3 unspecified: Secondary | ICD-10-CM | POA: Insufficient documentation

## 2024-02-05 DIAGNOSIS — I5042 Chronic combined systolic (congestive) and diastolic (congestive) heart failure: Secondary | ICD-10-CM | POA: Insufficient documentation

## 2024-02-05 DIAGNOSIS — I214 Non-ST elevation (NSTEMI) myocardial infarction: Secondary | ICD-10-CM | POA: Insufficient documentation

## 2024-02-05 DIAGNOSIS — N189 Chronic kidney disease, unspecified: Secondary | ICD-10-CM | POA: Insufficient documentation

## 2024-02-05 LAB — COMPREHENSIVE METABOLIC PANEL
ALT: 24 U/L (ref 0–44)
AST: 31 U/L (ref 15–41)
Albumin: 4.2 g/dL (ref 3.5–5.0)
Alkaline Phosphatase: 61 U/L (ref 38–126)
Anion gap: 9 (ref 5–15)
BUN: 38 mg/dL — ABNORMAL HIGH (ref 8–23)
CO2: 21 mmol/L — ABNORMAL LOW (ref 22–32)
Calcium: 9.5 mg/dL (ref 8.9–10.3)
Chloride: 104 mmol/L (ref 98–111)
Creatinine, Ser: 1.61 mg/dL — ABNORMAL HIGH (ref 0.61–1.24)
GFR, Estimated: 47 mL/min — ABNORMAL LOW (ref >60)
Glucose, Bld: 153 mg/dL — ABNORMAL HIGH (ref 70–99)
Potassium: 4.8 mmol/L (ref 3.5–5.1)
Sodium: 134 mmol/L — ABNORMAL LOW (ref 135–145)
Total Bilirubin: 0.6 mg/dL (ref 0.0–1.2)
Total Protein: 7.5 g/dL (ref 6.5–8.1)

## 2024-02-05 LAB — TYPE AND SCREEN
ABO/RH(D): A POS
Antibody Screen: NEGATIVE

## 2024-02-05 LAB — TROPONIN I (HIGH SENSITIVITY)
Troponin I (High Sensitivity): 61 ng/L — ABNORMAL HIGH (ref <18)
Troponin I (High Sensitivity): 56 ng/L — ABNORMAL HIGH (ref <18)

## 2024-02-05 LAB — CBC WITH DIFFERENTIAL/PLATELET
Abs Immature Granulocytes: 0.02 10*3/uL (ref 0.00–0.07)
Basophils Absolute: 0 10*3/uL (ref 0.0–0.1)
Basophils Relative: 1 %
Eosinophils Absolute: 0.2 10*3/uL (ref 0.0–0.5)
Eosinophils Relative: 2 %
HCT: 29.7 % — ABNORMAL LOW (ref 39.0–52.0)
Hemoglobin: 9.7 g/dL — ABNORMAL LOW (ref 13.0–17.0)
Immature Granulocytes: 0 %
Lymphocytes Relative: 14 %
Lymphs Abs: 1.2 10*3/uL (ref 0.7–4.0)
MCH: 30.8 pg (ref 26.0–34.0)
MCHC: 32.7 g/dL (ref 30.0–36.0)
MCV: 94.3 fL (ref 80.0–100.0)
Monocytes Absolute: 0.7 10*3/uL (ref 0.1–1.0)
Monocytes Relative: 9 %
Neutro Abs: 6 10*3/uL (ref 1.7–7.7)
Neutrophils Relative %: 74 %
Platelets: 294 10*3/uL (ref 150–400)
RBC: 3.15 MIL/uL — ABNORMAL LOW (ref 4.22–5.81)
RDW: 15.1 % (ref 11.5–15.5)
WBC: 8.2 10*3/uL (ref 4.0–10.5)
nRBC: 0 % (ref 0.0–0.2)

## 2024-02-05 LAB — LACTIC ACID, PLASMA
Lactic Acid, Venous: 1.3 mmol/L (ref 0.5–1.9)
Lactic Acid, Venous: 1 mmol/L (ref 0.5–1.9)

## 2024-02-05 LAB — URINALYSIS, ROUTINE W REFLEX MICROSCOPIC
RBC / HPF: >50 RBC/hpf (ref 0–5)
Squamous Epithelial / HPF: 0 /HPF (ref 0–5)
WBC, UA: >50 WBC/hpf (ref 0–5)

## 2024-02-05 LAB — LIPASE, BLOOD: Lipase: 27 U/L (ref 11–51)

## 2024-02-05 LAB — BRAIN NATRIURETIC PEPTIDE: B Natriuretic Peptide: 214.8 pg/mL — ABNORMAL HIGH (ref 0.0–100.0)

## 2024-02-05 MED ORDER — SODIUM CHLORIDE 0.9 % IV BOLUS
1000.0000 mL | Freq: Once | INTRAVENOUS | Status: AC
  Administered 2024-02-05: 1000 mL via INTRAVENOUS

## 2024-02-05 MED ORDER — SODIUM CHLORIDE 0.9 % IR SOLN: 3000.0000 mL | Status: DC

## 2024-02-05 MED ORDER — FENTANYL CITRATE PF 50 MCG/ML IJ SOSY
50.0000 ug | PREFILLED_SYRINGE | Freq: Once | INTRAMUSCULAR | Status: AC
  Administered 2024-02-05: 50 ug via INTRAVENOUS
  Filled 2024-02-05: qty 1

## 2024-02-05 MED ORDER — ONDANSETRON 4 MG PO TBDP: 4.0000 mg | ORAL_TABLET | Freq: Three times a day (TID) | ORAL | 0 refills | Status: DC | PRN

## 2024-02-05 MED ORDER — OXYCODONE HCL 5 MG PO TABS: 5.0000 mg | ORAL_TABLET | ORAL | 0 refills | Status: AC | PRN

## 2024-02-05 MED ORDER — HYDROMORPHONE HCL 1 MG/ML IJ SOLN
0.5000 mg | Freq: Once | INTRAMUSCULAR | Status: AC
  Administered 2024-02-05: 0.5 mg via INTRAVENOUS
  Filled 2024-02-05: qty 0.5

## 2024-02-05 NOTE — ED Triage Notes (Signed)
 Pt in via POV, reports recent discharge from the hospital, arrives today w/ complaints of right side chest pain, right flank pain and hematuria, reports passing blood and large clots in urine since this am.  Patient A/Ox4, also presents w/ dizziness, light headed and near syncopal episode in triage room.  Roomed at this time; EDP, Siadecki to bedside.

## 2024-02-05 NOTE — ED Provider Notes (Signed)
 [facility] Provider Note    Event Date/Time   First MD Initiated Contact with Patient 02/05/24 1642     (approximate)   History   Flank Pain, Chest Pain, and Hematuria   HPI  Ronnie Mann is a 68 y.o. male with a history of PE, combined systolic and diastolic heart failure, CKD, CAD, hypertension, and dyslipidemia who presents with chest pain, lower abdominal pain, and gross hematuria with clots, all acute onset this morning.  The patient reports feeling dizzy and lightheaded, and has some shortness of breath.  He states that chest pain is mainly on the right side.  He also has right flank pain.  He denies any vomiting or diarrhea.  He denies any other abnormal bleeding or bruising.  I reviewed the past medical records.  The patient was admitted to the hospitalist service earlier this month and discharged on 3/13 after presenting with chest pain.  He was diagnosed with a acute pulm emboli, segmental and subsegmental.  He was started on Eliquis.  He had a ureteral stent in place but had no hematuria at that time.  Previously he had a right percutaneous nephrolithotomy and stent placement on 3/3 that was complicated by intraoperative pneumothorax requiring chest tube placement at [facility]  He was then transferred to [facility] for admission to the urology service and plastic surgery evaluation for a possible IV infiltration to his right forearm.  He had an NSTEMI during that admission as well.   Physical Exam   Triage Vital Signs: ED Triage Vitals  Encounter Vitals Group     BP 02/05/24 1644 (!) 75/66     Systolic BP Percentile --      Diastolic BP Percentile --      Pulse Rate 02/05/24 1644 63     Resp 02/05/24 1644 (!) 21     Temp 02/05/24 1644 (!) 97.5 F (36.4 C)     Temp Source 02/05/24 1644 Oral     SpO2 02/05/24 1644 100 %     Weight --      Height --      Head Circumference --      Peak Flow --      Pain Score 02/05/24 1645 9     Pain Loc  --      Pain Education --      Exclude from Growth Chart --     Most recent vital signs: Vitals:   02/05/24 2130 02/05/24 2210  BP: (!) 116/53 123/67  Pulse: 86 60  Resp: 18 13  Temp: 98.3 F (36.8 C)   SpO2: 100% 100%     General: Alert, uncomfortable appearing, no distress.  CV:  Good peripheral perfusion.  Resp:  Normal effort.  Lungs CTAB. Abd:  Soft with bilateral lower quadrant/suprapubic tenderness.  No distention.  Other:  No jaundice or scleral icterus.   ED Results / Procedures / Treatments   Labs (all labs ordered are listed, but only abnormal results are displayed) Labs Reviewed  COMPREHENSIVE METABOLIC PANEL - Abnormal; Notable for the following components:      Result Value   Sodium 134 (*)    CO2 21 (*)    Glucose, Bld 153 (*)    BUN 38 (*)    Creatinine, Ser 1.61 (*)    GFR, Estimated 47 (*)    All other components within normal limits  CBC WITH DIFFERENTIAL/PLATELET - Abnormal; Notable for the following components:   RBC 3.15 (*)  Hemoglobin 9.7 (*)    HCT 29.7 (*)    All other components within normal limits  URINALYSIS, ROUTINE W REFLEX MICROSCOPIC - Abnormal; Notable for the following components:   Color, Urine RED (*)    APPearance TURBID (*)    Glucose, UA   (*)    Value: TEST NOT REPORTED DUE TO COLOR INTERFERENCE OF URINE PIGMENT   Hgb urine dipstick   (*)    Value: TEST NOT REPORTED DUE TO COLOR INTERFERENCE OF URINE PIGMENT   Bilirubin Urine   (*)    Value: TEST NOT REPORTED DUE TO COLOR INTERFERENCE OF URINE PIGMENT   Ketones, ur   (*)    Value: TEST NOT REPORTED DUE TO COLOR INTERFERENCE OF URINE PIGMENT   Protein, ur   (*)    Value: TEST NOT REPORTED DUE TO COLOR INTERFERENCE OF URINE PIGMENT   Nitrite   (*)    Value: TEST NOT REPORTED DUE TO COLOR INTERFERENCE OF URINE PIGMENT   Leukocytes,Ua   (*)    Value: TEST NOT REPORTED DUE TO COLOR INTERFERENCE OF URINE PIGMENT   Bacteria, UA FEW (*)    All other components within  normal limits  BRAIN NATRIURETIC PEPTIDE - Abnormal; Notable for the following components:   B Natriuretic Peptide 214.8 (*)    All other components within normal limits  TROPONIN I (HIGH SENSITIVITY) - Abnormal; Notable for the following components:   Troponin I (High Sensitivity) 61 (*)    All other components within normal limits  TROPONIN I (HIGH SENSITIVITY) - Abnormal; Notable for the following components:   Troponin I (High Sensitivity) 56 (*)    All other components within normal limits  LIPASE, BLOOD  LACTIC ACID, PLASMA  LACTIC ACID, PLASMA  TYPE AND SCREEN     EKG  ED ECG REPORT I, Dionne Bucy, the attending physician, personally viewed and interpreted this ECG.  Date: 02/05/2024 EKG Time: 1632 Rate: 68 Rhythm: normal sinus rhythm QRS Axis: normal Intervals: RBBB, LAFB ST/T Wave abnormalities: LVH Narrative Interpretation: no evidence of acute ischemia    RADIOLOGY  Chest x-ray: I independently viewed and interpreted the images; there is no focal consolidation or edema  CT renal stone study:   IMPRESSION:  Previously seen right upper pole and right renal pelvic stones no  longer visualized. There are numerous small stones in the right  ureter along the right ureteral stent. Stable mild right  hydronephrosis.    Stable left lower pole renal stones and 8 mm proximal left ureteral  stone. Stable mild left hydronephrosis.    Numerous large gallstones within the gallbladder. No evidence of  acute cholecystitis.    Large hiatal hernia.    Aortic atherosclerosis.    PROCEDURES:  Critical Care performed: Yes, see critical care procedure note(s)  .Critical Care  Performed by: Dionne Bucy, MD Authorized by: Dionne Bucy, MD   Critical care provider statement:    Critical care time (minutes):  30   Critical care time was exclusive of:  Separately billable procedures and treating other patients   Critical care was necessary to  treat or prevent imminent or life-threatening deterioration of the following conditions:  Circulatory failure   Critical care was time spent personally by me on the following activities:  Development of treatment plan with patient or surrogate, discussions with consultants, evaluation of patient's response to treatment, examination of patient, ordering and review of laboratory studies, ordering and review of radiographic studies, ordering and performing treatments and  interventions, pulse oximetry, re-evaluation of patient's condition, review of old charts and obtaining history from patient or surrogate    MEDICATIONS ORDERED IN ED: Medications  sodium chloride 0.9 % bolus 1,000 mL (0 mLs Intravenous Stopped 02/05/24 1903)  fentaNYL (SUBLIMAZE) injection 50 mcg (50 mcg Intravenous Given 02/05/24 1659)  HYDROmorphone (DILAUDID) injection 0.5 mg (0.5 mg Intravenous Given 02/05/24 1949)     IMPRESSION / MDM / ASSESSMENT AND PLAN / ED COURSE  I reviewed the triage vital signs and the nursing notes.  68 year old male with PMH as noted above including recent ureteral stent placement, iatrogenic pneumothorax, NSTEMI, and PE, all within the last 2 weeks, now presents with worsening chest pain since this morning associated with lower abdominal pain as well as blood and clots in his urine.  Differential diagnosis includes, but is not limited to:  Chest pain: ACS, recurrent or worsening PE, musculoskeletal pain, GERD.  EKG is nonischemic.  The patient is not tachycardic or hypoxic and is on Eliquis.  Therefore I have a lower suspicion for new PE.  We will obtain cardiac enzymes, chest x-ray, and reassess. Hematuria/abdominal/flank pain: Ureteral stone, stent complication, UTI.  We will obtain basic labs, urinalysis, CT, and irrigate the bladder. Hypotension: The patient was hypotensive in triage and was brought back to room although the blood pressure recovered within a short amount of time.  The patient  felt very lightheaded and in acute pain at this time.  This most consistent with a vasovagal episode but dehydration, hypovolemia, acute blood loss, sepsis are all in the differential.  Patient's presentation is most consistent with acute presentation with potential threat to life or bodily function.  The patient is on the cardiac monitor to evaluate for evidence of arrhythmia and/or significant heart rate changes.  ----------------------------------------- 10:09 PM on 02/05/2024 -----------------------------------------  Workup is overall reassuring.  Troponins are minimally elevated but trending downwards from his recent labs.  Chest x-ray is clear.  The patient's chest pain is now resolved.  BNP is mildly elevated.  There is no evidence of PE or indication for further workup.  CT shows no new ureteral stones or any evidence of clots in the bladder.  I consulted and discussed the case with Dr. Apolinar Junes who is familiar with the patient.  She advised that the stent could be removed.  She advised against bladder irrigation, and feels that the stent is most likely the culprit for the bleeding.  She recommended that we remove it and that the bleeding would likely clear up.  After the stent was removed, the patient expressed immediate relief in terms of his lower abdominal and flank pain.  He is feeling much better.  He is able to urinate without difficulty.  The urine is still grossly bloody although he is not passing any clots or other material.  Other lab workup is reassuring.  CMP shows stable creatinine.  CBC shows stable anemia and no leukocytosis.  Lactate is negative.  Clinically, the patient appears much more comfortable.  His blood pressure has remained stable since just after his initial arrival.  Other vital signs are stable as well.  He feels well and would like to go home.  I did consider whether he may benefit from inpatient admission due to the hematuria.  However given the immediate  improvement of his symptoms after the stent was removed, the lack of any drop in his hemoglobin, the stable vital signs, my consultation with urology, and his preference to go home, I feel  that discharge is appropriate.  I counseled the patient on the results of the workup and plan of care.  I recommended he follow-up with his primary care provider and urologist.  He should continue his Eliquis.  I gave him strict return precautions and he expressed understanding.  FINAL CLINICAL IMPRESSION(S) / ED DIAGNOSES   Final diagnoses:  Gross hematuria  Atypical chest pain     Rx / DC Orders   ED Discharge Orders          Ordered    oxyCODONE (OXY IR/ROXICODONE) 5 MG immediate release tablet  Every 4 hours PRN        02/05/24 2208    ondansetron (ZOFRAN-ODT) 4 MG disintegrating tablet  Every 8 hours PRN        02/05/24 2208             Note:  This document was prepared using Dragon voice recognition software and may include unintentional dictation errors.    Dionne Bucy, MD 02/05/24 2229

## 2024-02-05 NOTE — Discharge Instructions (Signed)
 Follow-up with your primary care provider and your urologist as scheduled.  You may take the oxycodone as needed for breakthrough pain over the next few days.  Take the Zofran as needed for nausea.  Return to the ER immediately for new, worsening, or persistent blood in the urine, passing large amounts of blood or clots, abdominal or flank pain, chest pain, difficulty breathing, fever, weakness, or any other new or worsening symptoms that concern you.

## 2024-02-05 NOTE — ED Notes (Signed)
 Ureteral stent removed per MD orders,

## 2024-02-06 ENCOUNTER — Ambulatory Visit: Admitting: Internal Medicine

## 2024-02-07 ENCOUNTER — Telehealth: Payer: Self-pay

## 2024-02-07 NOTE — Telephone Encounter (Signed)
 Copied from CRM [id number]. Topic: Clinical - Home Health Verbal Orders >> Feb 07, 2024 10:25 AM Hubert Azure wrote: Caller/Agency: [facility] Callback Number: [phone] Service Requested: Occupational Therapy Frequency: 1 time a week for 4 weeks  Any new concerns about the patient? No

## 2024-02-07 NOTE — Telephone Encounter (Signed)
 Copied from CRM [id number]. Topic: General - Other >> Feb 07, 2024 10:22 AM Ronnie Mann wrote: Reason for CRM: [facility] is calling to report that the patient is gaining weight

## 2024-02-08 ENCOUNTER — Telehealth: Payer: Self-pay | Admitting: Family

## 2024-02-08 ENCOUNTER — Ambulatory Visit: Payer: Medicare HMO | Admitting: Internal Medicine

## 2024-02-08 NOTE — Progress Notes (Unsigned)
 Spoke with Clarisa Kindred, FNP. Pt should resume Spironolactone 25 MG every day if not currently taking it.   If he's already started back on the [place], he should double his current Lasix dosage.  Attempted to call pt to speak with Nurse Dawn. They did not leave a good call back number to call her back and was unable to reach the pt as well. Called and spoke with pt's son who plans on giving [place] a call back with the pt present once he's home.

## 2024-02-09 ENCOUNTER — Other Ambulatory Visit: Payer: Self-pay

## 2024-02-09 ENCOUNTER — Encounter: Payer: Self-pay | Admitting: Cardiovascular Disease

## 2024-02-09 MED ORDER — FUROSEMIDE 20 MG PO TABS: 40.0000 mg | ORAL_TABLET | Freq: Every day | ORAL | Status: DC

## 2024-02-09 MED ORDER — SPIRONOLACTONE 25 MG PO TABS
25.0000 mg | ORAL_TABLET | Freq: Every day | ORAL | 3 refills | Status: DC
  Filled 2024-02-09: qty 30, 30d supply, fill #0

## 2024-02-09 NOTE — Telephone Encounter (Signed)
 Spoke w/Dawn, she is with pt now and will advise him and ensure med changes are made, she states he does not have Cleda Daub and will need RX sent in. RX sent to [facility]. Advised pt has f/u appt 3/28 if not improving before then to call us back, they are agreeable.

## 2024-02-09 NOTE — Telephone Encounter (Signed)
 error

## 2024-02-10 ENCOUNTER — Emergency Department

## 2024-02-10 ENCOUNTER — Other Ambulatory Visit: Payer: Self-pay

## 2024-02-10 ENCOUNTER — Emergency Department
Admission: EM | Admit: 2024-02-10 | Discharge: 2024-02-11 | Disposition: A | Discharge status: Home / Self Care | Attending: Emergency Medicine | Admitting: Emergency Medicine

## 2024-02-10 DIAGNOSIS — Z7901 Long term (current) use of anticoagulants: Secondary | ICD-10-CM | POA: Diagnosis not present

## 2024-02-10 DIAGNOSIS — R079 Chest pain, unspecified: Secondary | ICD-10-CM | POA: Diagnosis present

## 2024-02-10 DIAGNOSIS — R0789 Other chest pain: Secondary | ICD-10-CM | POA: Diagnosis not present

## 2024-02-10 LAB — BASIC METABOLIC PANEL
Anion gap: 11 (ref 5–15)
BUN: 32 mg/dL — ABNORMAL HIGH (ref 8–23)
CO2: 20 mmol/L — ABNORMAL LOW (ref 22–32)
Calcium: 9.3 mg/dL (ref 8.9–10.3)
Chloride: 104 mmol/L (ref 98–111)
Creatinine, Ser: 1.51 mg/dL — ABNORMAL HIGH (ref 0.61–1.24)
GFR, Estimated: 50 mL/min — ABNORMAL LOW (ref >60)
Glucose, Bld: 149 mg/dL — ABNORMAL HIGH (ref 70–99)
Potassium: 4.1 mmol/L (ref 3.5–5.1)
Sodium: 135 mmol/L (ref 135–145)

## 2024-02-10 LAB — CBC
HCT: 27.4 % — ABNORMAL LOW (ref 39.0–52.0)
Hemoglobin: 9 g/dL — ABNORMAL LOW (ref 13.0–17.0)
MCH: 31.3 pg (ref 26.0–34.0)
MCHC: 32.8 g/dL (ref 30.0–36.0)
MCV: 95.1 fL (ref 80.0–100.0)
Platelets: 243 10*3/uL (ref 150–400)
RBC: 2.88 MIL/uL — ABNORMAL LOW (ref 4.22–5.81)
RDW: 15 % (ref 11.5–15.5)
WBC: 5.7 10*3/uL (ref 4.0–10.5)
nRBC: 0 % (ref 0.0–0.2)

## 2024-02-10 LAB — TROPONIN I (HIGH SENSITIVITY)
Troponin I (High Sensitivity): 25 ng/L — ABNORMAL HIGH (ref <18)
Troponin I (High Sensitivity): 23 ng/L — ABNORMAL HIGH (ref <18)

## 2024-02-10 MED ORDER — OXYCODONE-ACETAMINOPHEN 5-325 MG PO TABS
1.0000 | ORAL_TABLET | ORAL | Status: AC
  Administered 2024-02-10: 1 via ORAL
  Filled 2024-02-10: qty 1

## 2024-02-10 MED ORDER — IOHEXOL 350 MG/ML SOLN
75.0000 mL | Freq: Once | INTRAVENOUS | Status: AC | PRN
  Administered 2024-02-10: 75 mL via INTRAVENOUS

## 2024-02-10 NOTE — ED Triage Notes (Signed)
 Patient arrived by [facility] from home with chest pain. Has home health nurse. Put on eliquis recently for blood clots in lungs.   History MI a year ago  Patient took nitroglycerin at home this AM with relief and then started to return. Home health nurse reports he has been restless today. Reports his trazodone is not working.   EMS Vitals: 66HR 98% RA 115/64 b/p 172CBG 97.5 axillary

## 2024-02-10 NOTE — ED Triage Notes (Signed)
 Pt c/o CP and SHOB that started yesterday. Pt AOX4, NAD noted. Dyspnea at rest noted.

## 2024-02-10 NOTE — ED Provider Notes (Signed)
 [facility] Provider Note    Event Date/Time   First MD Initiated Contact with Patient 02/10/24 2053     (approximate)   History   Chest Pain   HPI  Ronnie Mann is a 68 y.o. male who has a history of recent pulmonary embolism, prior ACS, prior pneumothorax as a result of surgical procedure.  He is currently anticoagulated  Patient reports that this evening and throughout the day has been having a discomfort in his right mid chest.  Feels sharp at times.  No cough no fevers no chills no nausea or vomiting.  He attempted to call his doctors office to discuss it but was unable to get a hold of them.  Relates that he was about a week ago given prescription for oxycodone which had helped and the symptoms he had a week ago or about the same and also located on the right     Physical Exam   Triage Vital Signs: ED Triage Vitals [02/10/24 1852]  Encounter Vitals Group     BP 119/68     Systolic BP Percentile      Diastolic BP Percentile      Pulse Rate 68     Resp (!) 22     Temp 97.8 F (36.6 C)     Temp Source Oral     SpO2 100 %     Weight      Height      Head Circumference      Peak Flow      Pain Score 7     Pain Loc      Pain Education      Exclude from Growth Chart     Most recent vital signs: Vitals:   02/10/24 2300 02/10/24 2305  BP:    Pulse: (!) 57   Resp: 19 14  Temp:  97.8 F (36.6 C)  SpO2: 99%      General: Awake, no distress.  He is very pleasant without obvious distress CV:  Good peripheral perfusion.  Normal tones.  Regular. Resp:  Normal effort.  Clear bilateral.  Normal work of breathing.  Reports a slight pleuritic component discomfort on the right chest with inspiration Abd:  No distention.  Soft nontender nondistended on exam.  No upper abdominal pain bilateral Other:  No lower extremity edema.  No venous cords or congestion   ED Results / Procedures / Treatments   Labs (all labs ordered are listed, but only  abnormal results are displayed) Labs Reviewed  BASIC METABOLIC PANEL - Abnormal; Notable for the following components:      Result Value   CO2 20 (*)    Glucose, Bld 149 (*)    BUN 32 (*)    Creatinine, Ser 1.51 (*)    GFR, Estimated 50 (*)    All other components within normal limits  CBC - Abnormal; Notable for the following components:   RBC 2.88 (*)    Hemoglobin 9.0 (*)    HCT 27.4 (*)    All other components within normal limits  TROPONIN I (HIGH SENSITIVITY) - Abnormal; Notable for the following components:   Troponin I (High Sensitivity) 25 (*)    All other components within normal limits  TROPONIN I (HIGH SENSITIVITY) - Abnormal; Notable for the following components:   Troponin I (High Sensitivity) 23 (*)    All other components within normal limits     EKG  And inter by me at 1900  heart rate 70 QRS 120 QTc 440 Atrial fibrillation versus artifact.  Suspect mostly artifact as appears fairly regular.  Review of previous ECG shows relatively low amplitude P waves.  Nonetheless, patient is currently anticoagulated, this juncture doubt A-fib but I suspect there are P waves present with some artifact.   RADIOLOGY   CT Angio Chest PE W and/or Wo Contrast Result Date: 02/10/2024 CLINICAL DATA:  Right-sided chest pain EXAM: CT ANGIOGRAPHY CHEST WITH CONTRAST TECHNIQUE: Multidetector CT imaging of the chest was performed using the standard protocol during bolus administration of intravenous contrast. Multiplanar CT image reconstructions and MIPs were obtained to evaluate the vascular anatomy. RADIATION DOSE REDUCTION: This exam was performed according to the departmental dose-optimization program which includes automated exposure control, adjustment of the mA and/or kV according to patient size and/or use of iterative reconstruction technique. CONTRAST:  75mL OMNIPAQUE IOHEXOL 350 MG/ML SOLN COMPARISON:  Chest x-ray from earlier in the same day. FINDINGS: Cardiovascular:  Atherosclerotic calcifications of the thoracic aorta are noted. Mild irregular plaque is noted in the distal aortic arch stable in appearance from the prior exam. No evidence of dissection or aneurysmal dilatation is seen. Heart is at the upper limits of normal in size. Mild coronary calcifications are noted. The pulmonary artery shows a normal branching pattern bilaterally. No intraluminal filling defect to suggest pulmonary embolism is noted. Mediastinum/Nodes: Thoracic inlet is within normal limits. No hilar or mediastinal adenopathy is noted. Minimal pericardial fluid is noted the superior pericardial reflection. Large hiatal hernia is noted with the majority of the stomach in the chest cavity. Lungs/Pleura: Mild compressive atelectasis is noted in the left lower lobe related to the large hiatal hernia. Mild emphysematous changes are seen. No focal infiltrate or effusion is noted. Upper Abdomen: Visualized upper abdomen shows a large lower pole left renal stone measuring up to 2 cm. Previously seen right renal pelvic stone is no longer identified. Cystic changes are seen within the right kidney stable from exam. No further follow-up is recommended. Gallbladder is well distended with multiple stones within. Stable left adrenal lesion is noted consistent with adenoma. Musculoskeletal: No chest wall abnormality. No acute or significant osseous findings. Review of the MIP images confirms the above findings. IMPRESSION: No evidence of pulmonary emboli. Stable atherosclerotic changes in the thoracic aorta. Left renal calculus without obstructive change. Cholelithiasis without complicating factors. Aortic Atherosclerosis (ICD10-I70.0) and Emphysema (ICD10-J43.9). Electronically Signed   By: Alcide Clever M.D.   On: 02/10/2024 22:14   DG Chest 2 View Result Date: 02/10/2024 CLINICAL DATA:  Chest pain and shortness of breath starting yesterday. EXAM: CHEST - 2 VIEW COMPARISON:  02/05/2024 FINDINGS: Shallow inspiration.  Heart size and pulmonary vascularity are normal for technique. There is infiltration or atelectasis in the left lung base, similar to prior study. This could represent compressive atelectasis or pneumonia. The right lung is clear. There is a large esophageal hiatal hernia behind the heart. No pleural effusion or pneumothorax. Mediastinal contours appear intact. Degenerative changes in the spine. IMPRESSION: 1. Cardiac enlargement. 2. Infiltration or atelectasis in the left base. 3. Large esophageal hiatal hernia. Electronically Signed   By: Burman Nieves M.D.   On: 02/10/2024 20:14   CT study reviewed by me, no evidence of acute finding.  Cholelithiasis without associated right upper quadrant pain.   PROCEDURES:  Critical Care performed: No  Procedures   MEDICATIONS ORDERED IN ED: Medications  iohexol (OMNIPAQUE) 350 MG/ML injection 75 mL (75 mLs Intravenous Contrast Given 02/10/24 2152)  oxyCODONE-acetaminophen (PERCOCET/ROXICET) 5-325 MG per tablet 1 tablet (1 tablet Oral Given 02/10/24 2204)     IMPRESSION / MDM / ASSESSMENT AND PLAN / ED COURSE  I reviewed the triage vital signs and the nursing notes.                              Differential diagnosis includes, but is not limited to, ACS, aortic dissection, pulmonary embolism, cardiac tamponade, pneumothorax, pneumonia, pericarditis, myocarditis, GI-related causes including esophagitis/gastritis, and musculoskeletal chest wall pain.    He has a complicated history of chest issues including pneumothorax, recent thromboembolism, prior ACS.  Symptoms very atypical of ACS.  Initial troponin is within keeping of historical troponins for him, doubtful of ACS.  Repeat troponin within patient's baseline for recent troponins  Patient's presentation is most consistent with acute complicated illness / injury requiring diagnostic workup.  Further imaging including CT ordered.  He has no fever no obvious frank infectious symptoms.  Chemistry  shows chronic renal disease.  The patient is on the cardiac monitor to evaluate for evidence of arrhythmia and/or significant heart rate changes.  Discussed with patient and patient's pain improved after taking oxycodone.  Advises that his son will come pick him up.  Repeat CT scan very reassuring without evidence of acute pneumonia or recurrent PE.  No pneumothorax.  Discussed careful return precautions with the patient and he is agreeable.  Reports similar symptoms occurred about a week ago question if this could be somewhat of a chronic or recurring issue at this point without clear evidence support ACS, life-threatening pulmonary cause etc.  Return precautions and treatment recommendations and follow-up discussed with the patient who is agreeable with the plan.      FINAL CLINICAL IMPRESSION(S) / ED DIAGNOSES   Final diagnoses:  Atypical chest pain     Rx / DC Orders   ED Discharge Orders     None        Note:  This document was prepared using Dragon voice recognition software and may include unintentional dictation errors.   Sharyn Creamer, MD 02/11/24 [phone]

## 2024-02-12 ENCOUNTER — Ambulatory Visit: Payer: Self-pay

## 2024-02-12 ENCOUNTER — Other Ambulatory Visit: Payer: Self-pay

## 2024-02-12 ENCOUNTER — Ambulatory Visit: Admitting: Internal Medicine

## 2024-02-12 NOTE — Telephone Encounter (Addendum)
 Chief Complaint: Abdominal pain  Symptoms: upper right abdominal pain that radiates to the back  Frequency: Constant  Pertinent Negatives: Patient denies new onset of symptoms Disposition: [] ED /[] Urgent Care (no appt availability in office) / [] Appointment(In office/virtual)/ []  [employer] [facility]/ [] Home Care/ [] Refused Recommended Disposition /[] [place] [facility]/ [x]  Follow-up with PCP Additional Notes: Spoke to Kendal Hymen, PT with [facility]. Kendal Hymen states she saw the patient today and he was complaining of abdominal pain in the upper quadrant and radiates to the lumbar portion of the back. Kendal Hymen states the patient was scheduled for a hospital follow up but called to reschedule appointment due to not having the money for a copay. Patient did not report his symptoms when he he rescheduled the appointment. Patient has an appointment scheduled on 02/22/24 with PCP. Kendal Hymen is requesting medication for pain control to help get through therapy sessions until his scheduled appointment with PCP. Patients preferred pharmacy is [facility] [address] [facility] [place]. Please advise   Copied from CRM [id number]. Topic: Clinical - Red Word Triage >> Feb 12, 2024 11:48 AM Elle L wrote: Red Word that prompted transfer to Nurse Triage: Marlaine Hind with [facility] states that the patient is in a lot of pain and he was unable to go to his appointment today as he did not have the money for the copay. Reason for Disposition  [1] MODERATE pain (e.g., interferes with normal activities) AND [2] pain comes and goes (cramps) AND [3] present > 24 hours  (Exception: Pain with Vomiting or Diarrhea - see that Guideline.)  Answer Assessment - Initial Assessment Questions 1. LOCATION: "Where does it hurt?"      Right upper abdominal area 2. RADIATION: "Does the pain shoot anywhere else?" (e.g., chest, back)     To the lumbar area of the back  3. ONSET: "When did the pain begin?" (Minutes, hours or days  ago)      Chronic  4. SUDDEN: "Gradual or sudden onset?"     Gradual  5. PATTERN "Does the pain come and go, or is it constant?"    - If it comes and goes: "How long does it last?" "Do you have pain now?"     (Note: Comes and goes means the pain is intermittent. It goes away completely between bouts.)    - If constant: "Is it getting better, staying the same, or getting worse?"      (Note: Constant means the pain never goes away completely; most serious pain is constant and gets worse.)      Constant  6. SEVERITY: "How bad is the pain?"  (e.g., Scale 1-10; mild, moderate, or severe)    - MILD (1-3): Doesn't interfere with normal activities, abdomen soft and not tender to touch.     - MODERATE (4-7): Interferes with normal activities or awakens from sleep, abdomen tender to touch.     - SEVERE (8-10): Excruciating pain, doubled over, unable to do any normal activities.       7/10 7. RECURRENT SYMPTOM: "Have you ever had this type of stomach pain before?" If Yes, ask: "When was the last time?" and "What happened that time?"      No  8. CAUSE: "What do you think is causing the stomach pain?"     Chronic pain  9. RELIEVING/AGGRAVATING FACTORS: "What makes it better or worse?" (e.g., antacids, bending or twisting motion, bowel movement)     No  10. OTHER SYMPTOMS: "Do you have any  other symptoms?" (e.g., back pain, diarrhea, fever, urination pain, vomiting)       Back pain  Protocols used: Abdominal Pain - Male-A-AH

## 2024-02-12 NOTE — Telephone Encounter (Signed)
 Appt sch'd for April and pt feels he can wait until then.

## 2024-02-14 ENCOUNTER — Other Ambulatory Visit: Payer: Self-pay

## 2024-02-14 ENCOUNTER — Emergency Department

## 2024-02-14 ENCOUNTER — Emergency Department
Admission: EM | Admit: 2024-02-14 | Discharge: 2024-02-14 | Disposition: A | Discharge status: Home / Self Care | Attending: Emergency Medicine | Admitting: Emergency Medicine

## 2024-02-14 DIAGNOSIS — K529 Noninfective gastroenteritis and colitis, unspecified: Secondary | ICD-10-CM | POA: Diagnosis not present

## 2024-02-14 DIAGNOSIS — I110 Hypertensive heart disease with heart failure: Secondary | ICD-10-CM | POA: Insufficient documentation

## 2024-02-14 DIAGNOSIS — R109 Unspecified abdominal pain: Secondary | ICD-10-CM

## 2024-02-14 DIAGNOSIS — I509 Heart failure, unspecified: Secondary | ICD-10-CM | POA: Insufficient documentation

## 2024-02-14 DIAGNOSIS — I2510 Atherosclerotic heart disease of native coronary artery without angina pectoris: Secondary | ICD-10-CM | POA: Diagnosis not present

## 2024-02-14 HISTORY — DX: Heart failure, unspecified: I50.9

## 2024-02-14 LAB — BASIC METABOLIC PANEL
Anion gap: 7 (ref 5–15)
BUN: 34 mg/dL — ABNORMAL HIGH (ref 8–23)
CO2: 23 mmol/L (ref 22–32)
Calcium: 8.8 mg/dL — ABNORMAL LOW (ref 8.9–10.3)
Chloride: 105 mmol/L (ref 98–111)
Creatinine, Ser: 1.73 mg/dL — ABNORMAL HIGH (ref 0.61–1.24)
GFR, Estimated: 43 mL/min — ABNORMAL LOW (ref >60)
Glucose, Bld: 102 mg/dL — ABNORMAL HIGH (ref 70–99)
Potassium: 4.1 mmol/L (ref 3.5–5.1)
Sodium: 135 mmol/L (ref 135–145)

## 2024-02-14 LAB — CBC
HCT: 25.3 % — ABNORMAL LOW (ref 39.0–52.0)
Hemoglobin: 8.2 g/dL — ABNORMAL LOW (ref 13.0–17.0)
MCH: 31.3 pg (ref 26.0–34.0)
MCHC: 32.4 g/dL (ref 30.0–36.0)
MCV: 96.6 fL (ref 80.0–100.0)
Platelets: 171 10*3/uL (ref 150–400)
RBC: 2.62 MIL/uL — ABNORMAL LOW (ref 4.22–5.81)
RDW: 14.7 % (ref 11.5–15.5)
WBC: 4.3 10*3/uL (ref 4.0–10.5)
nRBC: 0 % (ref 0.0–0.2)

## 2024-02-14 LAB — URINALYSIS, ROUTINE W REFLEX MICROSCOPIC
Bilirubin Urine: NEGATIVE
Glucose, UA: NEGATIVE mg/dL
Ketones, ur: NEGATIVE mg/dL
Nitrite: NEGATIVE
Protein, ur: 30 mg/dL — AB
RBC / HPF: >50 RBC/hpf (ref 0–5)
Specific Gravity, Urine: 1.01 (ref 1.005–1.030)
pH: 5 (ref 5.0–8.0)

## 2024-02-14 LAB — TROPONIN I (HIGH SENSITIVITY): Troponin I (High Sensitivity): 21 ng/L — ABNORMAL HIGH (ref <18)

## 2024-02-14 MED ORDER — IOHEXOL 350 MG/ML SOLN
100.0000 mL | Freq: Once | INTRAVENOUS | Status: AC | PRN
  Administered 2024-02-14: 100 mL via INTRAVENOUS

## 2024-02-14 MED ORDER — CEFDINIR 300 MG PO CAPS: 300.0000 mg | ORAL_CAPSULE | Freq: Two times a day (BID) | ORAL | 0 refills | Status: DC

## 2024-02-14 MED ORDER — OXYCODONE-ACETAMINOPHEN 5-325 MG PO TABS
1.0000 | ORAL_TABLET | Freq: Once | ORAL | Status: DC
  Filled 2024-02-14: qty 1

## 2024-02-14 MED ORDER — OXYCODONE-ACETAMINOPHEN 5-325 MG PO TABS: 1.0000 | ORAL_TABLET | Freq: Four times a day (QID) | ORAL | 0 refills | Status: AC | PRN

## 2024-02-14 MED ORDER — OXYCODONE-ACETAMINOPHEN 5-325 MG PO TABS
1.0000 | ORAL_TABLET | Freq: Once | ORAL | Status: AC
  Administered 2024-02-14: 1 via ORAL
  Filled 2024-02-14: qty 1

## 2024-02-14 MED ORDER — METRONIDAZOLE 500 MG PO TABS: 500.0000 mg | ORAL_TABLET | Freq: Two times a day (BID) | ORAL | 0 refills | Status: DC

## 2024-02-14 MED ORDER — SODIUM CHLORIDE 0.9 % IV BOLUS
500.0000 mL | Freq: Once | INTRAVENOUS | Status: AC
  Administered 2024-02-14: 500 mL via INTRAVENOUS

## 2024-02-14 NOTE — ED Notes (Signed)
 This RN received report from Clayborn Bigness RN and performed care handoff. This RN introduced self to pt. Call light in reach, bed wheels locked, side rail raised, pt updated on plan of care. Rounding completed.

## 2024-02-14 NOTE — ED Notes (Signed)
 Nurse Tech in room assisting with urine sample at bedside.

## 2024-02-14 NOTE — Discharge Instructions (Addendum)
 Your CT scan shows some inflammation of the colon in the right side of you abdomen.  Take pain medication as needed, along with antibiotics, and follow up with your doctor.

## 2024-02-14 NOTE — ED Triage Notes (Signed)
 Pt to ED for R flank pain since last night Had stent to R ureter placed 3-4 weeks ago States is urinating, denies hematuria States lasix dose recently increased Pt also states is dizzy (EKG done) from the pain. Skin is dry.

## 2024-02-14 NOTE — ED Triage Notes (Signed)
 First Nurse Note: Patient to ED via [facility] from home for right sided pain- from chest down to flank. C/o dizziness. Symptoms started last night. AOx4   112/68 114 cbg

## 2024-02-14 NOTE — ED Provider Notes (Signed)
 [facility] Provider Note    Event Date/Time   First MD Initiated Contact with Patient 02/14/24 1550     (approximate)   History   Chief Complaint: Flank Pain (R)   HPI  Ronnie Mann is a 68 y.o. male with a history of CAD CHF hypertension kidney stones who comes ED complaining of right chest pain and flank pain starting last night, waxing and waning.  No vomiting diarrhea or fever, no chest pain or shortness of breath.  He notes he has a complicated history recently having a ureteral stent complicated by pneumothorax requiring a chest tube.          Physical Exam   Triage Vital Signs: ED Triage Vitals  Encounter Vitals Group     BP 02/14/24 1327 (!) 109/58     Systolic BP Percentile --      Diastolic BP Percentile --      Pulse Rate 02/14/24 1327 (!) 50     Resp 02/14/24 1327 20     Temp 02/14/24 1327 98.2 F (36.8 C)     Temp Source 02/14/24 1327 Oral     SpO2 02/14/24 1327 100 %     Weight 02/14/24 1323 187 lb (84.8 kg)     Height 02/14/24 1323 5\' 9"  (1.753 m)     Head Circumference --      Peak Flow --      Pain Score 02/14/24 1323 9     Pain Loc --      Pain Education --      Exclude from Growth Chart --     Most recent vital signs: Vitals:   02/14/24 2130 02/14/24 2200  BP: (!) 118/39 (!) 121/53  Pulse: 73 66  Resp: (!) 25 18  Temp:    SpO2: 99% 100%    General: Awake, no distress.  CV:  Good peripheral perfusion.  Regular rate rhythm Resp:  Normal effort.  Clear to auscultation bilaterally Abd:  No distention.  Soft with mild right mid abdomen tenderness, no CVA tenderness Other:  Somewhat dry oral mucosa   ED Results / Procedures / Treatments   Labs (all labs ordered are listed, but only abnormal results are displayed) Labs Reviewed  URINALYSIS, ROUTINE W REFLEX MICROSCOPIC - Abnormal; Notable for the following components:      Result Value   Color, Urine YELLOW (*)    APPearance HAZY (*)    Hgb urine dipstick  LARGE (*)    Protein, ur 30 (*)    Leukocytes,Ua TRACE (*)    Bacteria, UA RARE (*)    All other components within normal limits  BASIC METABOLIC PANEL - Abnormal; Notable for the following components:   Glucose, Bld 102 (*)    BUN 34 (*)    Creatinine, Ser 1.73 (*)    Calcium 8.8 (*)    GFR, Estimated 43 (*)    All other components within normal limits  CBC - Abnormal; Notable for the following components:   RBC 2.62 (*)    Hemoglobin 8.2 (*)    HCT 25.3 (*)    All other components within normal limits  TROPONIN I (HIGH SENSITIVITY) - Abnormal; Notable for the following components:   Troponin I (High Sensitivity) 21 (*)    All other components within normal limits     EKG    RADIOLOGY CT chest interpreted by me, negative for pneumothorax or pneumonia.  Radiology report reviewed  CT abdomen pelvis reveals  a small area of colitis in the ascending colon, uncomplicated.   PROCEDURES:  Procedures   MEDICATIONS ORDERED IN ED: Medications  oxyCODONE-acetaminophen (PERCOCET/ROXICET) 5-325 MG per tablet 1 tablet (1 tablet Oral Not Given 02/14/24 2022)  iohexol (OMNIPAQUE) 350 MG/ML injection 100 mL (100 mLs Intravenous Contrast Given 02/14/24 1635)  sodium chloride 0.9 % bolus 500 mL (0 mLs Intravenous Stopped 02/14/24 2216)  oxyCODONE-acetaminophen (PERCOCET/ROXICET) 5-325 MG per tablet 1 tablet (1 tablet Oral Given 02/14/24 2045)     IMPRESSION / MDM / ASSESSMENT AND PLAN / ED COURSE  I reviewed the triage vital signs and the nursing notes.  DDx: Cystitis, pyelonephritis, ureteral obstruction, AKI, electrolyte derangement, non-STEMI, PE  Patient's presentation is most consistent with acute presentation with potential threat to life or bodily function.  Patient presents with right chest pain and flank pain, complicated recent history including kidney stone intervention along with pneumothorax and prolonged hospitalization.  Will need to obtain CTA chest, CT abdomen pelvis  along with labs.   Clinical Course as of 02/14/24 2319  Wed Feb 14, 2024  2146 CT shows mild area of uncomplicated colitis in the ascending colon.  Pain is well-controlled with Percocet.  Will start him moderates, oral Percocet, patient comfortable with discharge home and outpatient follow-up. [PS]    Clinical Course User Index [PS] Sharman Cheek, MD     FINAL CLINICAL IMPRESSION(S) / ED DIAGNOSES   Final diagnoses:  Colitis  Right flank pain  Chronic congestive heart failure, unspecified heart failure type [facility])     Rx / DC Orders   ED Discharge Orders          Ordered    cefdinir (OMNICEF) 300 MG capsule  2 times daily        02/14/24 2146    metroNIDAZOLE (FLAGYL) 500 MG tablet  2 times daily        02/14/24 2146    oxyCODONE-acetaminophen (PERCOCET) 5-325 MG tablet  Every 6 hours PRN        02/14/24 2146             Note:  This document was prepared using Dragon voice recognition software and may include unintentional dictation errors.   Sharman Cheek, MD 02/14/24 [phone]

## 2024-02-15 ENCOUNTER — Telehealth: Payer: Self-pay | Admitting: Family

## 2024-02-15 NOTE — Telephone Encounter (Incomplete)
 Called to confirm/remind patient of their appointment at the [facility] on 02/16/24***.   Appointment:   [x] Confirmed  [] Left mess   [] No answer/No voice mail  [] Phone not in service  Patient reminded to bring all medications and/or complete list.  Confirmed patient has transportation. Gave directions, instructed to utilize valet parking.

## 2024-02-15 NOTE — Progress Notes (Unsigned)
 [facility] Note    PCP: Margarita Mail, DO (last seen 01/25) Primary Cardiologist: Julien Nordmann, MD (saw during 06/24 admission)  Chief Complaint: urinating blood and blood clots   HPI:  Ronnie Mann is a 68 y/o male with a history of NSTEMI, s/p PCI to proximal LAD and RCA on 05/01/23, HFrEF(EF 30 to 35%), previous tobacco/ alcohol use, HTN, CKD, UTI, hyperlipidemia, kidney stones since the age of 43, right uretal stone s/p stent 10/12/23, bilateral nephrolithiasis and pulmonary embolus (03/25).   Admitted 04/28/23 due to new onset of substernal "vice like" substernal chest pain that radiated to his left arm. He reports ongoing shortness of breath associated with this. He also was awoken from sleep with this last night and had broken out into a cold sweat. Elevated lactic acid and elevated WBC at 14.4. 1 L of IVF given. Initial troponin was 54 rose to 234. 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 Developed flash pulmonary edema during left heart cath 06/10 and was treated with nitroglycerin drip. Diuretics held. Status post successful balloon angioplasty to the proximal LAD and drug-eluting stent placement to the proximal right coronary artery. Treated for pneumonia with antibiotics. Was in the ED 05/13/23 due to bilateral flank and suprapubic pain. CT renal was stable. Admitted 05/16/23 due to shortness of breath, bilateral flank pain radiating towards lower abdomen, more on right than left, dysuria and gross hematuria. Patient deferred urology stent placement. Able to void after foley removed. Brilinta was switched to Plavix, aspirin discontinued in the setting of hematuria by cardiologist    Admitted 10/10/23 due to shortness of breath, hematuria and flank pain. Started on IV lasix for HF exacerbation. Cardiology consulted and feel respiratory symptoms more related to bronchitis. Lasix held and solu-medrol provided. Respiratory panel  negative. Urology consulted and stent placed. Losartan was changed to entresto.   Admitted 11/04/23 due to right flank pain. RUQ sono showed cholelithiasis with positive sonographic Murphy's borderline thickening of gallbladder wall at fundus suspicious for acute cholecystitis.  CT renal stone study showed right ureteral stent in position, persistent calculi in right ureter, pelvis and proximal mid left ureter.  Surgery team evaluated the patient in the ED, advised HIDA scan which ruled out acute cholecystitis. Entresto, lasix and spironolactone held due to low BP. To follow up with [facility] urology early January 2025.  Has had 2 ED visits 01/25 due to pain from his kidney stone  Had PCNL and ureteral stent placed 01/22/24 complicated by intraoperative pneumothorax requiring right-sided chest tube placement with Interventional Radiology on 01/22/2024 at [facility]. He also received pressors intraoperatively for which IV was infiltrated with concern for right forearm necrosis. Had episode of sudden chest pain radiating to the left arm. The patient had an equivocal EKG with possible ST elevations and Troponins in the 700s. Cardiology service was consulted for their recommendations, and there was no acute intervention for cath lab. Troponins peaked later that night to the 2000s and later downtrended to 1800s. On POD4 the pigtail chest tube was removed with stable CXR, post-pull chest XR was stable.     Admitted 01/30/24 with sudden onset of chest pain localized in the left side chest radiate to the left arm-8:30 AM yesterday. He took some nitroglycerin, and slept. He had another episode of chest pain today at 9:30 AM, was described as a sharp, persistent. 10/10 in severity. Radiate to the left arm. Worsening shortness of breath where he has to sleep  in the recliner. Troponins only mildly elevated and stable. Cardiology consulted, does not think this is ACS. VQ scan pursued which shows acute pulmonary  embolus (segmental in LLL and subsegmental in RLL. Heparin drip started. Urology consulted. Started on apixaban.   He presents today for a HF f/u visit with a chief complaint of urinating bright red blood along with multiple blood clots. Also reporting severe pain after recent PCNL and stent placement. + nausea & dizziness.    Previous cardiac studies:   Echo 04/30/23: EF 30-35% along with mild LVH and Grade II DD and moderate Ronnie.  Echo 10/13/23: EF 25-30% with moderate LAE, moderate Ronnie  LHC 05/01/23:   Prox LAD to Mid LAD lesion is 99% stenosed.   Mid LAD lesion is 60% stenosed.   1st Diag lesion is 70% stenosed.   Prox RCA lesion is 95% stenosed.   Mid RCA lesion is 30% stenosed.   RPDA lesion is 60% stenosed.   Dist LAD lesion is 99% stenosed.   A drug-eluting stent was successfully placed using a STENT ONYX FRONTIER 4.0X15.   Balloon angioplasty was performed using a BALLN [place] EUPHORA RX 2.75X20.   Post intervention, there is a 20% residual stenosis.   Post intervention, there is a 0% residual stenosis.   There is severe left ventricular systolic dysfunction.   LV end diastolic pressure is severely elevated.   There is moderate (3+) mitral regurgitation.  1.  Severe two-vessel coronary artery disease with subtotal occlusion of the proximal LAD, diffuse moderate mid LAD disease and severe distal LAD disease.  In addition, there is 95% in the proximal right coronary artery with faint right to left collaterals to the LAD.  The left circumflex has mild nonobstructive disease and OM 3 is large and reaches the apex. 2.  Severely reduced LV systolic function with an EF of 25%.  Severely elevated left ventricular end-diastolic pressure at 34 mmHg. 3.  Successful balloon angioplasty to the proximal LAD and drug-eluting stent placement to the proximal right coronary artery.    ROS: All systems negative except as listed in HPI, PMH and Problem List.  SH:  Social History   Socioeconomic  History   Marital status: Divorced    Spouse name: Not on file   Number of children: Not on file   Years of education: Not on file   Highest education level: 10th grade  Occupational History   Not on file  Tobacco Use   Smoking status: Former    Types: Cigarettes    Passive exposure: Never   Smokeless tobacco: Never  Vaping Use   Vaping status: Never Used  Substance and Sexual Activity   Alcohol use: Not Currently    Comment: former moderate drinker   Drug use: Yes    Types: Marijuana   Sexual activity: Not on file  Other Topics Concern   Not on file  Social History Narrative   Not on file   Social Drivers of Health   Financial Resource Strain: Low Risk  (01/23/2024)   [facility]   Overall Financial Resource Strain (CARDIA)    Difficulty of Paying Living Expenses: Not hard at all  Food Insecurity: Food Insecurity Present (02/01/2024)   Hunger Vital Sign    Worried About Running Out of Food in the Last Year: Often true    Ran Out of Food in the Last Year: Often true  Transportation Needs: No Transportation Needs (01/31/2024)   PRAPARE - Transportation  Lack of Transportation (Medical): No    Lack of Transportation (Non-Medical): No  Physical Activity: Not on file  Stress: Not on file  Social Connections: Socially Isolated (01/31/2024)   Social Connection and Isolation Panel [NHANES]    Frequency of Communication with Friends and Family: Once a week    Frequency of Social Gatherings with Friends and Family: Never    Attends Religious Services: Never    Database administrator or Organizations: No    Attends Banker Meetings: Never    Marital Status: Divorced  Catering manager Violence: Not At Risk (01/31/2024)   Humiliation, Afraid, Rape, and Kick questionnaire    Fear of Current or Ex-Partner: No    Emotionally Abused: No    Physically Abused: No    Sexually Abused: No    FH:  Family History  Problem Relation Age of Onset   Heart  disease Father     Past Medical History:  Diagnosis Date   CHF (congestive heart failure) (HCC)    Coronary artery disease 05/01/2023   PCI/DES   HFrEF (heart failure with reduced ejection fraction) (HCC) 05/01/2023   Hiatal hernia    HLD (hyperlipidemia)    HTN (hypertension)    Ischemic cardiomyopathy 05/01/2023   LVEF 30-35%   Kidney stones    Pneumothorax     Current Outpatient Medications  Medication Sig Dispense Refill   albuterol (VENTOLIN HFA) 108 (90 Base) MCG/ACT inhaler Inhale 2 puffs into the lungs every 6 (six) hours as needed for wheezing or shortness of breath. 17 each 0   apixaban (ELIQUIS) 5 MG TABS tablet Take 2 tablets (10 mg total) by mouth 2 (two) times daily for 7 days, THEN 1 tablet (5 mg total) 2 (two) times daily. 120 tablet 1   atorvastatin (LIPITOR) 40 MG tablet Take 1 tablet (40 mg total) by mouth daily. 90 tablet 3   cefdinir (OMNICEF) 300 MG capsule Take 1 capsule (300 mg total) by mouth 2 (two) times daily. 14 capsule 0   furosemide (LASIX) 20 MG tablet Take 2 tablets (40 mg total) by mouth daily.     metoprolol succinate (TOPROL-XL) 25 MG 24 hr tablet Take 1 tablet (25 mg total) by mouth daily. 90 tablet 3   metroNIDAZOLE (FLAGYL) 500 MG tablet Take 1 tablet (500 mg total) by mouth 2 (two) times daily. 14 tablet 0   nitroGLYCERIN (NITROSTAT) 0.4 MG SL tablet Place 0.4 mg under the tongue every 5 (five) minutes as needed for chest pain.     ondansetron (ZOFRAN-ODT) 4 MG disintegrating tablet Take 1 tablet (4 mg total) by mouth every 8 (eight) hours as needed. 12 tablet 0   oxyCODONE-acetaminophen (PERCOCET) 5-325 MG tablet Take 1 tablet by mouth every 6 (six) hours as needed for up to 3 days for severe pain (pain score 7-10). 10 tablet 0   polyethylene glycol (MIRALAX / GLYCOLAX) 17 g packet Take 17 g by mouth daily. 30 each 0   sacubitril-valsartan (ENTRESTO) 24-26 MG Take 1 tablet by mouth 2 (two) times daily. 180 tablet 2   solifenacin (VESICARE) 10  MG tablet Take 10 mg by mouth daily.     spironolactone (ALDACTONE) 25 MG tablet Take 1 tablet (25 mg total) by mouth daily. 30 tablet 3   tamsulosin (FLOMAX) 0.4 MG CAPS capsule Take 1 capsule (0.4 mg total) by mouth daily after supper. 30 capsule 2   traZODone (DESYREL) 50 MG tablet Take 0.5-1 tablets (25-50 mg total) by mouth  at bedtime as needed for sleep. (Patient taking differently: Take 50 mg by mouth at bedtime as needed for sleep.) 90 tablet 0   No current facility-administered medications for this visit.   There were no vitals filed for this visit.  Wt Readings from Last 3 Encounters:  02/14/24 187 lb (84.8 kg)  02/05/24 191 lb 9.6 oz (86.9 kg)  02/05/24 189 lb (85.7 kg)   Lab Results  Component Value Date   CREATININE 1.73 (H) 02/14/2024   CREATININE 1.51 (H) 02/10/2024   CREATININE 1.61 (H) 02/05/2024    PHYSICAL EXAM:  General: Uncomfortable appearing with grayish color; bent over at the waist due to pain. No resp difficulty HEENT: normal Extremities: no cyanosis, clubbing, rash, edema Neuro: alert & oriented X 3. Moves all 4 extremities w/o difficulty. Affect pleasant although in obvious pain with intermittent groaning   ECG: not done   ASSESSMENT & PLAN:  1: Ischemic heart failure with reduced ejection fraction- - NSTEMI with PCI 06/24 - NYHA class II - euvolemic - weight down 12 pounds from last visit here 1 month ago - Echo 04/30/23: EF 30-35% along with mild LVH and Grade II DD and moderate Ronnie.  - Echo 10/13/23: EF 25-30% with moderate LAE, moderate Ronnie - continue furosemide 20mg  daily - continue metoprolol succinate 25mg  daily - continue entresto 24/26mg  BID - MRA still on hold but will consider adding at future visit - due to chronic kidney stone; may not be a good candidate for SGLT2 - BNP 01/30/24 was 298.2  2: CAD- - was NS with cardiology Mariah Milling) 08/24; this needs R/S but his priority is urology issues right now - continue atorvastatin 40mg   daily - LHC 05/01/23:   Prox LAD to Mid LAD lesion is 99% stenosed.   Mid LAD lesion is 60% stenosed.   1st Diag lesion is 70% stenosed.   Prox RCA lesion is 95% stenosed.   Mid RCA lesion is 30% stenosed.   RPDA lesion is 60% stenosed.   Dist LAD lesion is 99% stenosed.   A drug-eluting stent was successfully placed using a STENT ONYX FRONTIER 4.0X15.   Balloon angioplasty was performed using a BALLN [place] EUPHORA RX 2.75X20.   Post intervention, there is a 20% residual stenosis.   Post intervention, there is a 0% residual stenosis.   There is severe left ventricular systolic dysfunction.   LV end diastolic pressure is severely elevated.   There is moderate (3+) mitral regurgitation.  1.  Severe two-vessel coronary artery disease with subtotal occlusion of the proximal LAD, diffuse moderate mid LAD disease and severe distal LAD disease.  In addition, there is 95% in the proximal right coronary artery with faint right to left collaterals to the LAD.  The left circumflex has mild nonobstructive disease and OM 3 is large and reaches the apex. 2.  Severely reduced LV systolic function with an EF of 25%.  Severely elevated left ventricular end-diastolic pressure at 34 mmHg. 3.  Successful balloon angioplasty to the proximal LAD and drug-eluting stent placement to the proximal right coronary artery.  3: HTN- - BP 109/65 - saw PCP Caralee Ates) 01/25 - BMP 02/01/24 showed sodium 135, potassium 4.6, creatinine 1.53 & GFR 50  4: Kidney stones (managed by urology)- - followed by [facility] urology (last seen 01/25) - has bilateral staghorn renal stones  - right ureteral stent placed 10/12/23  - PCNL and urethral stent done 01/22/24 with subsequent complication of pneumothorax - now with hematuria, passing blood clots  and in uncontrolled pain - on apixaban due to recent PE diagnosis - patient taken to ER via wheelchair for further evaluation - may benefit from pain clinic referral   Follow-up here pending ED  disposition.   Delma Freeze, FNP 02/15/24

## 2024-02-16 ENCOUNTER — Other Ambulatory Visit (HOSPITAL_COMMUNITY): Payer: Self-pay | Admitting: Cardiology

## 2024-02-16 ENCOUNTER — Encounter: Payer: Self-pay | Admitting: Family

## 2024-02-16 ENCOUNTER — Ambulatory Visit: Attending: Family | Admitting: Family

## 2024-02-16 ENCOUNTER — Other Ambulatory Visit: Payer: Self-pay | Admitting: Internal Medicine

## 2024-02-16 ENCOUNTER — Inpatient Hospital Stay (HOSPITAL_COMMUNITY)
Admission: RE | Admit: 2024-02-16 | Discharge: 2024-02-16 | Disposition: A | Discharge status: Home / Self Care | Source: Ambulatory Visit | Attending: Cardiology | Admitting: Cardiology

## 2024-02-16 ENCOUNTER — Other Ambulatory Visit: Payer: Self-pay

## 2024-02-16 VITALS — BP 101/61 | HR 70 | Wt 189.2 lb

## 2024-02-16 DIAGNOSIS — R5383 Other fatigue: Secondary | ICD-10-CM | POA: Diagnosis present

## 2024-02-16 DIAGNOSIS — I5022 Chronic systolic (congestive) heart failure: Secondary | ICD-10-CM | POA: Diagnosis not present

## 2024-02-16 DIAGNOSIS — Z87891 Personal history of nicotine dependence: Secondary | ICD-10-CM | POA: Diagnosis not present

## 2024-02-16 DIAGNOSIS — I252 Old myocardial infarction: Secondary | ICD-10-CM | POA: Insufficient documentation

## 2024-02-16 DIAGNOSIS — R002 Palpitations: Secondary | ICD-10-CM | POA: Insufficient documentation

## 2024-02-16 DIAGNOSIS — N189 Chronic kidney disease, unspecified: Secondary | ICD-10-CM | POA: Diagnosis not present

## 2024-02-16 DIAGNOSIS — E785 Hyperlipidemia, unspecified: Secondary | ICD-10-CM | POA: Diagnosis not present

## 2024-02-16 DIAGNOSIS — I2699 Other pulmonary embolism without acute cor pulmonale: Secondary | ICD-10-CM | POA: Insufficient documentation

## 2024-02-16 DIAGNOSIS — R110 Nausea: Secondary | ICD-10-CM

## 2024-02-16 DIAGNOSIS — Z87442 Personal history of urinary calculi: Secondary | ICD-10-CM | POA: Insufficient documentation

## 2024-02-16 DIAGNOSIS — I130 Hypertensive heart and chronic kidney disease with heart failure and stage 1 through stage 4 chronic kidney disease, or unspecified chronic kidney disease: Secondary | ICD-10-CM | POA: Insufficient documentation

## 2024-02-16 DIAGNOSIS — I452 Bifascicular block: Secondary | ICD-10-CM | POA: Diagnosis not present

## 2024-02-16 DIAGNOSIS — Z955 Presence of coronary angioplasty implant and graft: Secondary | ICD-10-CM | POA: Insufficient documentation

## 2024-02-16 DIAGNOSIS — Z87440 Personal history of urinary (tract) infections: Secondary | ICD-10-CM | POA: Insufficient documentation

## 2024-02-16 DIAGNOSIS — Z7901 Long term (current) use of anticoagulants: Secondary | ICD-10-CM | POA: Insufficient documentation

## 2024-02-16 DIAGNOSIS — I255 Ischemic cardiomyopathy: Secondary | ICD-10-CM | POA: Diagnosis not present

## 2024-02-16 DIAGNOSIS — I10 Essential (primary) hypertension: Secondary | ICD-10-CM

## 2024-02-16 DIAGNOSIS — R109 Unspecified abdominal pain: Secondary | ICD-10-CM | POA: Insufficient documentation

## 2024-02-16 DIAGNOSIS — R0602 Shortness of breath: Secondary | ICD-10-CM | POA: Diagnosis present

## 2024-02-16 DIAGNOSIS — I2510 Atherosclerotic heart disease of native coronary artery without angina pectoris: Secondary | ICD-10-CM | POA: Insufficient documentation

## 2024-02-16 DIAGNOSIS — G8929 Other chronic pain: Secondary | ICD-10-CM | POA: Diagnosis present

## 2024-02-16 MED ORDER — VALSARTAN 40 MG PO TABS
20.0000 mg | ORAL_TABLET | Freq: Every day | ORAL | 3 refills | Status: DC
Start: 2024-02-16 — End: 2024-05-14
  Filled 2024-02-16: qty 45, 90d supply, fill #0

## 2024-02-16 MED ORDER — ONDANSETRON 4 MG PO TBDP
4.0000 mg | ORAL_TABLET | Freq: Three times a day (TID) | ORAL | 0 refills | Status: DC | PRN
  Filled 2024-02-16: qty 20, 7d supply, fill #0

## 2024-02-16 NOTE — Patient Instructions (Addendum)
 Medication Changes:  DISCONTINUE ENTRESTO  START VALSARTAN 20 MG ONCE DAILY  Procedures:  Your provider has recommended that  you wear a Zio Patch for 14 days.  This monitor will record your heart rhythm for our review.  IF you have any symptoms while wearing the monitor please press the button.  If you have any issues with the patch or you notice a red or orange light on it please call the company at [phone].  Once you remove the patch please mail it back to the company as soon as possible so we can get the results.   YOU WILL RECEIVE YOUR LIVE ZIO MONITOR (HEART MONITOR) IN THE MAIL- YOU MAY PLACE THIS AT HOME FOLLOWING INSTRUCTIONS OR BRING IT BACK TO CLINIC AND WE CAN APPLY FOR YOU   Referrals:  We have placed a referral today to Pain Management for your pain. They should reach out to you within 1-2 weeks. If they do not, please reach out to them. Their information is below on this AVS. They are down the hall on the other side of the elevators, if you'd like to go over there and go ahead and get on their schedule rather than waiting for a call.   Follow-Up in: 1 MONTH WITH Clarisa Kindred, FNP.  At the [facility], you and your health needs are our priority. We have a designated team specialized in the treatment of Heart Failure. This Care Team includes your primary Heart Failure Specialized Cardiologist (physician), Advanced Practice Providers (APPs- Physician Assistants and Nurse Practitioners), and Pharmacist who all work together to provide you with the care you need, when you need it.   You may see any of the following providers on your designated Care Team at your next follow up:  Dr. Arvilla Meres Dr. Marca Ancona Dr. Dorthula Nettles Dr. Theresia Bough Clarisa Kindred, FNP Enos Fling, RPH-CPP  Please be sure to bring in all your medications bottles to every appointment.   Need to Contact us:  If you have any questions or concerns before your next  appointment please send [place] a message through [place] or call our office at [phone].    TO LEAVE A MESSAGE FOR THE NURSE SELECT OPTION 2, PLEASE LEAVE A MESSAGE INCLUDING: YOUR NAME DATE OF BIRTH CALL BACK NUMBER REASON FOR CALL**this is important as we prioritize the call backs  YOU WILL RECEIVE A CALL BACK THE SAME DAY AS LONG AS YOU CALL BEFORE 4:00 PM

## 2024-02-16 NOTE — Progress Notes (Signed)
 [facility] - HEART FAILURE CLINIC - PHARMACIST COUNSELING NOTE  Adherence Assessment  Do you ever forget to take your medication? [] Yes [x] No  Do you ever skip doses due to side effects? [] Yes [x] No  Do you have trouble affording your medicines? [] Yes [x] No  Are you ever unable to pick up your medication due to transportation difficulties? [] Yes [x] No  Do you ever stop taking your medications because you don't believe they are helping? [] Yes [x] No  Do you check your weight daily? [] Yes [x] No  Do you check your blood pressure daily? [] Yes [x] No  Adherence strategy: Pill box  Barriers to obtaining medications: None reported    Vital signs: HR 70, BP 101/61, weight 189 lbs.  ECHO: Date 10/13/2023, EF 25-30% Renal function: Date 01/25/2024, GFR 43  Current Guideline-Directed Medical Therapy/Evidence Based Medicine  ACE/ARB/ARNI: Sacubitril-valsartan 24-26 mg twice daily Target dose: 97/103 BID  Beta Blocker: Metoprolol succinate 25 mg daily Target dose: 200 mg daily  Aldosterone Antagonist: Spironolactone 25 mg daily Target dose:  SGLT2i:  N/A Target dose: N/A  Diuretic: Furosemide 40 mg daily  ASSESSMENT 68 year old male who presents to the HF clinic for a follow-up appointment. PMH is significant for NSTEMI, s/p PCI to proximal LAD and RCA on 05/01/23, HFrEF(EF 30 to 35%), previous tobacco/ alcohol use, HTN, CKD, UTI, hyperlipidemia, kidney stones since the age of 31, right uretal stone s/p stent 10/12/23, bilateral nephrolithiasis and pulmonary embolus (03/25). Today he reports SOB, poor appetite, weakness, radiating pain throughout his body, and trouble sleeping.   Recent ED visit or hospitalization (past 6 months):  Date: 02/14/2024, CC: flank pain  Date: 02/10/2024, CC: chest pain  Date: 02/05/2024, CC: flank pain, chest pain, & hematuria  Date: 01/30/2024 - 02/01/2024, CC: chest pain, Admission Dx: chest pain/ACS Date: 12/07/2023, CC: flank pain   Date: 12/03/2023, CC: flank pain  Date: 11/04/2023 - 11/06/2023, CC: cough & flank pain, Admission Dx: calculus of gallbladder without cholecystitis  Date: 10/10/2023 - 10/14/2023, CC: chest pain & SOB, Admission Dx: acute congestive HF  COUNSELING POINTS/CLINICAL PEARLS  Valsartan (Goal: 160 mg twice daily) Tell patient to avoid activities requiring coordination until drug effects are realized, as this medicine may cause dizziness.  Advise patient to report lightheadedness or syncope.   Side effects may include dizziness, upper respiratory infection, nasal congestion, and back pain.  Warn patient to avoid use of potassium supplements or potassium-containing salt substitutes unless they consult healthcare provider.  PLAN Recommendations discussed with NP Hypotensive on lowest dose of Entresto; consider either valsartan or losartan with eventual goal to transition back to [facility] if BP can tolerate  Likely not best candidate for SGLT2i given chronic kidney stones Due for annual ECHO in November   Time spent: 20 minutes   Littie Deeds, PharmD Pharmacy Resident  02/16/2024 9:21 AM

## 2024-02-19 ENCOUNTER — Telehealth: Payer: Self-pay

## 2024-02-19 NOTE — Telephone Encounter (Addendum)
 Pt arrived to have zio live placed. Pt not feeling well. Complained of left side chest pain and right flank pain. Vitals obtained. Bp 72/44 manual, 98% on ra, 64bpm. Advised pt to go to ED. Pt refused. Per Clarisa Kindred, NP pt to stop spiro.  Last bp obtained 80/50 manual.   Follow up made for next week.

## 2024-02-19 NOTE — Telephone Encounter (Signed)
 Talked w/ patient regarding his chest pain. Patient says that he gets pressure sometimes over his left chest and pain in the right flank where the stent was pulled from. Says that he doesn't feel like the pain is different from when he was here last week when his EKG was unchanged. Reports feeling generally unwell. Patient is adamant that he would NOT be going to the ER from here but does understand that if symptoms worsen, that he needs to call 911. Patient was drinking water while in the office.  Will stop spironolactone due to hypotension. Nurse placed live zio monitor. Will see patient back in clinic next week.

## 2024-02-22 ENCOUNTER — Ambulatory Visit (INDEPENDENT_AMBULATORY_CARE_PROVIDER_SITE_OTHER): Admitting: Internal Medicine

## 2024-02-22 ENCOUNTER — Encounter: Payer: Self-pay | Admitting: Internal Medicine

## 2024-02-22 ENCOUNTER — Other Ambulatory Visit: Payer: Self-pay

## 2024-02-22 VITALS — BP 120/74 | HR 68 | Temp 97.7°F | Resp 16 | Ht 68.0 in | Wt 190.7 lb

## 2024-02-22 DIAGNOSIS — R110 Nausea: Secondary | ICD-10-CM

## 2024-02-22 DIAGNOSIS — I5022 Chronic systolic (congestive) heart failure: Secondary | ICD-10-CM

## 2024-02-22 DIAGNOSIS — I2699 Other pulmonary embolism without acute cor pulmonale: Secondary | ICD-10-CM

## 2024-02-22 DIAGNOSIS — N200 Calculus of kidney: Secondary | ICD-10-CM

## 2024-02-22 DIAGNOSIS — F5104 Psychophysiologic insomnia: Secondary | ICD-10-CM | POA: Diagnosis not present

## 2024-02-22 DIAGNOSIS — I10 Essential (primary) hypertension: Secondary | ICD-10-CM | POA: Diagnosis not present

## 2024-02-22 MED ORDER — TRAZODONE HCL 50 MG PO TABS
50.0000 mg | ORAL_TABLET | Freq: Every evening | ORAL | 1 refills | Status: DC | PRN
  Filled 2024-02-22: qty 90, 90d supply, fill #0
  Filled 2024-04-25 – 2024-05-07 (×2): qty 90, 90d supply, fill #1

## 2024-02-22 MED ORDER — ONDANSETRON 4 MG PO TBDP
4.0000 mg | ORAL_TABLET | Freq: Three times a day (TID) | ORAL | 0 refills | Status: DC | PRN
  Filled 2024-02-22: qty 20, 7d supply, fill #0

## 2024-02-22 NOTE — Assessment & Plan Note (Signed)
 Now on Eliquis, tolerating well. Has a prescription for 3 months, will recheck in June after medication is complete.

## 2024-02-22 NOTE — Progress Notes (Signed)
 Established Patient Office Visit  Subjective    Patient ID: Ronnie Mann, male    DOB: Apr 22, 1956  Age: 68 y.o. MRN: [id number]  CC:  Chief Complaint  Patient presents with   Hospitalization Follow-up    HPI Ronnie Mann presents to follow up. He was admitted in March for multiple subsegmental PE and just previously for pneumothorax at [facility] requiring chest tube. Now on Eliquis 5 mg BID which he is tolerating well.   Hypertension/HFrEF: -Medications: Metoprolol XL 25 mg, Entresto, Lasix 20 mg, now on Valsartan 20 mg, Spironolactone 25 mg -Patient is compliant with above medications and reports no side effects. -Denies any SOB, CP, vision changes, LE edema or symptoms of hypotension -Following with Cardiology and HF clinic -Last echo 11/24 EF 25-30% with mild aortic root dilation to 40 mm  HLD/Hx of NSTEMI s/p PCI to proximal LAD and RCA 6/24: -Medications: Lipitor 40 mg, aspirin  -Patient is compliant with above medications and reports no side effects.  -Last lipid panel: Lipid Panel     Component Value Date/Time   CHOL 166 10/11/2023 0610   TRIG 72 10/11/2023 0610   HDL 30 (L) 10/11/2023 0610   CHOLHDL 5.5 10/11/2023 0610   VLDL 14 10/11/2023 0610   LDLCALC 122 (H) 10/11/2023 0610   Pre-Diabetes: -A1c 3/25 6% -Not on medications  Recurrent Kidney Stones: -First kidney stone was at age 54, he has underwent multiple surgeries and procedures over the years due to kidney stone -Right uretal stone s/p stent 11/24 -Was evaluated in the ER 11/24/23 for same issue -Currently on Oxybutynin 5 mg TID, Flomax 0.4 mg and prescribed Toviaz but has not yet received. Taking Percocet PRN.  Has an appointment with urology April 15th.  Insomnia: -Now on Trazodone 50 mg daily, states it does help but he needs refills  Health Maintenance: -Blood work UTD -Colon cancer screening due -Lung cancer screening due - quit smoking 8-10 years ago -Prevnar 20 due but patient declines at this  time, will think about it  Outpatient Encounter Medications as of 02/22/2024  Medication Sig   albuterol (VENTOLIN HFA) 108 (90 Base) MCG/ACT inhaler Inhale 2 puffs into the lungs every 6 (six) hours as needed for wheezing or shortness of breath.   apixaban (ELIQUIS) 5 MG TABS tablet Take 2 tablets (10 mg total) by mouth 2 (two) times daily for 7 days, THEN 1 tablet (5 mg total) 2 (two) times daily.   atorvastatin (LIPITOR) 40 MG tablet Take 1 tablet (40 mg total) by mouth daily.   cefdinir (OMNICEF) 300 MG capsule Take 1 capsule (300 mg total) by mouth 2 (two) times daily.   furosemide (LASIX) 20 MG tablet Take 2 tablets (40 mg total) by mouth daily.   metoprolol succinate (TOPROL-XL) 25 MG 24 hr tablet Take 1 tablet (25 mg total) by mouth daily.   metroNIDAZOLE (FLAGYL) 500 MG tablet Take 1 tablet (500 mg total) by mouth 2 (two) times daily.   nitroGLYCERIN (NITROSTAT) 0.4 MG SL tablet Place 0.4 mg under the tongue every 5 (five) minutes as needed for chest pain.   tamsulosin (FLOMAX) 0.4 MG CAPS capsule Take 1 capsule (0.4 mg total) by mouth daily after supper.   valsartan (DIOVAN) 40 MG tablet Take 0.5 tablets (20 mg total) by mouth daily.   [DISCONTINUED] ondansetron (ZOFRAN-ODT) 4 MG disintegrating tablet Take 1 tablet (4 mg total) by mouth every 8 (eight) hours as needed.   [DISCONTINUED] traZODone (DESYREL) 50 MG tablet Take  0.5-1 tablets (25-50 mg total) by mouth at bedtime as needed for sleep. (Patient taking differently: Take 50 mg by mouth at bedtime as needed for sleep.)   ondansetron (ZOFRAN-ODT) 4 MG disintegrating tablet Take 1 tablet (4 mg total) by mouth every 8 (eight) hours as needed.   senna-docusate (SENOKOT-S) 8.6-50 MG tablet Take 1 tablet by mouth daily as needed for mild constipation. (Patient not taking: Reported on 02/22/2024)   solifenacin (VESICARE) 10 MG tablet Take 10 mg by mouth daily. (Patient not taking: Reported on 02/22/2024)   traZODone (DESYREL) 50 MG tablet Take 1  tablet (50 mg total) by mouth at bedtime as needed for sleep.   No facility-administered encounter medications on file as of 02/22/2024.    Past Medical History:  Diagnosis Date   CHF (congestive heart failure) (HCC)    Coronary artery disease 05/01/2023   PCI/DES   HFrEF (heart failure with reduced ejection fraction) (HCC) 05/01/2023   Hiatal hernia    HLD (hyperlipidemia)    HTN (hypertension)    Ischemic cardiomyopathy 05/01/2023   LVEF 30-35%   Kidney stones    Pneumothorax     Past Surgical History:  Procedure Laterality Date   CORONARY STENT INTERVENTION N/A 05/01/2023   Procedure: CORONARY STENT INTERVENTION;  Surgeon: Iran Ouch, MD;  Location: [facility] INVASIVE CV LAB;  Service: Cardiovascular;  Laterality: N/A;   CYSTOSCOPY W/ URETERAL STENT PLACEMENT Right 10/12/2023   Procedure: CYSTOSCOPY WITH STENT REPLACEMENT;  Surgeon: Riki Altes, MD;  Location: [facility] ORS;  Service: Urology;  Laterality: Right;   LEFT HEART CATH AND CORONARY ANGIOGRAPHY N/A 05/01/2023   Procedure: LEFT HEART CATH AND CORONARY ANGIOGRAPHY;  Surgeon: Iran Ouch, MD;  Location: [facility] INVASIVE CV LAB;  Service: Cardiovascular;  Laterality: N/A;    Family History  Problem Relation Age of Onset   Heart disease Father     Social History   Socioeconomic History   Marital status: Divorced    Spouse name: Not on file   Number of children: Not on file   Years of education: Not on file   Highest education level: 10th grade  Occupational History   Not on file  Tobacco Use   Smoking status: Former    Types: Cigarettes    Passive exposure: Never   Smokeless tobacco: Never  Vaping Use   Vaping status: Never Used  Substance and Sexual Activity   Alcohol use: Not Currently    Comment: former moderate drinker   Drug use: Yes    Types: Marijuana   Sexual activity: Not on file  Other Topics Concern   Not on file  Social History Narrative   Not on file   Social Drivers of Health    Financial Resource Strain: Low Risk  (01/23/2024)   Received from [facility]   Overall Financial Resource Strain (CARDIA)    Difficulty of Paying Living Expenses: Not hard at all  Food Insecurity: Food Insecurity Present (02/01/2024)   Hunger Vital Sign    Worried About Running Out of Food in the Last Year: Often true    Ran Out of Food in the Last Year: Often true  Transportation Needs: No Transportation Needs (01/31/2024)   PRAPARE - Administrator, Civil Service (Medical): No    Lack of Transportation (Non-Medical): No  Physical Activity: Not on file  Stress: Not on file  Social Connections: Socially Isolated (01/31/2024)   Social Connection and Isolation Panel [NHANES]    Frequency  of Communication with Friends and Family: Once a week    Frequency of Social Gatherings with Friends and Family: Never    Attends Religious Services: Never    Database administrator or Organizations: No    Attends Banker Meetings: Never    Marital Status: Divorced  Catering manager Violence: Not At Risk (01/31/2024)   Humiliation, Afraid, Rape, and Kick questionnaire    Fear of Current or Ex-Partner: No    Emotionally Abused: No    Physically Abused: No    Sexually Abused: No    Review of Systems  Constitutional:  Negative for chills and fever.  Respiratory:  Negative for shortness of breath.   Cardiovascular:  Negative for leg swelling.  Genitourinary:  Negative for dysuria, frequency, hematuria and urgency.        Objective    BP 120/74 (Cuff Size: Large)   Pulse 68   Temp 97.7 F (36.5 C) (Oral)   Resp 16   Ht 5\' 8"  (1.727 m)   Wt 190 lb 11.2 oz (86.5 kg)   SpO2 97%   BMI 29.00 kg/m   Physical Exam Constitutional:      Appearance: Normal appearance.  HENT:     Head: Normocephalic and atraumatic.  Eyes:     Conjunctiva/sclera: Conjunctivae normal.  Cardiovascular:     Rate and Rhythm: Normal rate and regular rhythm.  Pulmonary:     Effort:  Pulmonary effort is normal.     Breath sounds: Normal breath sounds.  Musculoskeletal:     Right lower leg: No edema.     Left lower leg: No edema.  Skin:    General: Skin is warm and dry.  Neurological:     General: No focal deficit present.     Mental Status: He is alert. Mental status is at baseline.  Psychiatric:        Mood and Affect: Mood normal.        Behavior: Behavior normal.         Assessment & Plan:  Pulmonary embolism without acute cor pulmonale, unspecified chronicity, unspecified pulmonary embolism type (HCC) Assessment & Plan: Now on Eliquis, tolerating well. Has a prescription for 3 months, will recheck in June after medication is complete.    Essential hypertension Assessment & Plan: Blood pressure stable here today, no changes made to medications and appropriate refills sent to pharmacy.     Chronic systolic (congestive) heart failure [facility]) Assessment & Plan: Following with HF clinic, now back on all of his medications. Had been taking Lasix 40 mg daily but will decrease back to 20 mg daily and will take second 20 mg dose as needed for leg swelling or weight gain.    Psychophysiological insomnia Assessment & Plan: Stable, refill Trazodone.  Orders: -     traZODone HCl; Take 1 tablet (50 mg total) by mouth at bedtime as needed for sleep.  Dispense: 90 tablet; Refill: 1  Recurrent nephrolithiasis Assessment & Plan: Stable, has appointment with Urology 4/15.   Nausea -     Ondansetron; Take 1 tablet (4 mg total) by mouth every 8 (eight) hours as needed.  Dispense: 20 tablet; Refill: 0    Return in about 2 months (around 05/06/2024) for middle of June .   Margarita Mail, DO

## 2024-02-22 NOTE — Patient Instructions (Addendum)
 It was great seeing you today!  Plan discussed at today's visit: -Take Lasix 20 mg daily and can add a second 20 mg a day as needed for swelling -Sleep medicine and nausea medication refilled -Continue Eliquis for 3 months  Follow up in: 3 months   Take care and let us know if you have any questions or concerns prior to your next visit.  Dr. Caralee Ates

## 2024-02-22 NOTE — Assessment & Plan Note (Signed)
 Stable, refill Trazodone.

## 2024-02-22 NOTE — Assessment & Plan Note (Signed)
 Following with HF clinic, now back on all of his medications. Had been taking Lasix 40 mg daily but will decrease back to 20 mg daily and will take second 20 mg dose as needed for leg swelling or weight gain.

## 2024-02-22 NOTE — Assessment & Plan Note (Signed)
 Blood pressure stable here today, no changes made to medications and appropriate refills sent to pharmacy.

## 2024-02-22 NOTE — Assessment & Plan Note (Signed)
 Stable, has appointment with Urology 4/15.

## 2024-02-23 ENCOUNTER — Telehealth: Payer: Self-pay | Admitting: Family

## 2024-02-23 ENCOUNTER — Telehealth: Payer: Self-pay

## 2024-02-23 NOTE — Telephone Encounter (Incomplete)
 Called to confirm/remind patient of their appointment at the [facility] on 02/26/24***.   Appointment:   [x] Confirmed  [] Left mess   [] No answer/No voice mail  [] Phone not in service  Patient reminded to bring all medications and/or complete list.  Confirmed patient has transportation. Gave directions, instructed to utilize valet parking.

## 2024-02-23 NOTE — Telephone Encounter (Signed)
 Copied from CRM [id number]. Topic: Clinical - Home Health Verbal Orders >> Feb 23, 2024 10:22 AM DeAngela L wrote: Caller/Agency: Judeth Cornfield Occupational Therapist with [facility]  Callback Number: [phone] Service Requested: Patients blood pressure lower 100/56 and he feels a little dizzy and has been trendy on the lower side with his blood pressure, (94/58, 80/52, 100/65) Also Patient has been gaining weight in the last week   Any new concerns about the patient? Yes the weight gain and lower blood pressure are concerns  the Patient states he was taken off of one of his fluid pills yesterday at a Dr appt

## 2024-02-23 NOTE — Telephone Encounter (Signed)
 Spoke to Nickerson and she stated she had left but a nurse was getting there name Dorinda Hill and provided me with her number [phone] to talk to her as she was with the patient. I relayed to her Raynelle Fanning message per Dorinda Hill, pt is refusing to go to the ER due to they do not do nothing for him. Dorinda Hill nurse rechecked his BP was 98/58 and she states she would call EMS but it would be up to patient if he would want to go or refuse. Again I re stated it would be best to go tot the ER due to his sx and PCP not in office here today.

## 2024-02-25 NOTE — Progress Notes (Deleted)
 Advanced Heart Failure Clinic Note    PCP: Margarita Mail, DO (last seen 04/25) Primary Cardiologist: Julien Nordmann, MD (saw during 06/24 admission)  Chief Complaint:    HPI:  Ronnie Mann is a 68 y/o male with a history of NSTEMI, s/p PCI to proximal LAD and RCA on 05/01/23, HFrEF(EF 30 to 35%), previous tobacco/ alcohol use, HTN, CKD, UTI, hyperlipidemia, kidney stones since the age of 76, right uretal stone s/p stent 10/12/23, bilateral nephrolithiasis and pulmonary embolus (03/25).   Admitted 04/28/23 due to new onset of substernal "vice like" substernal chest pain that radiated to his left arm. He reports ongoing shortness of breath associated with this. He also was awoken from sleep with this last night and had broken out into a cold sweat. Elevated lactic acid and elevated WBC at 14.4. 1 L of IVF given. Initial troponin was 54 rose to 234. 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 Developed flash pulmonary edema during left heart cath 06/10 and was treated with nitroglycerin drip. Diuretics held. Status post successful balloon angioplasty to the proximal LAD and drug-eluting stent placement to the proximal right coronary artery. Treated for pneumonia with antibiotics. Was in the ED 05/13/23 due to bilateral flank and suprapubic pain. CT renal was stable. Admitted 05/16/23 due to shortness of breath, bilateral flank pain radiating towards lower abdomen, more on right than left, dysuria and gross hematuria. Patient deferred urology stent placement. Able to void after foley removed. Brilinta was switched to Plavix, aspirin discontinued in the setting of hematuria by cardiologist    Admitted 10/10/23 due to shortness of breath, hematuria and flank pain. Started on IV lasix for HF exacerbation. Cardiology consulted and feel respiratory symptoms more related to bronchitis. Lasix held and solu-medrol provided. Respiratory panel negative. Urology consulted and  stent placed. Losartan was changed to entresto.   Admitted 11/04/23 due to right flank pain. RUQ sono showed cholelithiasis with positive sonographic Murphy's borderline thickening of gallbladder wall at fundus suspicious for acute cholecystitis.  CT renal stone study showed right ureteral stent in position, persistent calculi in right ureter, pelvis and proximal mid left ureter.  Surgery team evaluated the patient in the ED, advised HIDA scan which ruled out acute cholecystitis. Entresto, lasix and spironolactone held due to low BP. To follow up with [facility] urology early January 2025.  Has had 2 ED visits 01/25 due to pain from his kidney stone  Had PCNL and ureteral stent placed 01/22/24 complicated by intraoperative pneumothorax requiring right-sided chest tube placement with Interventional Radiology on 01/22/2024 at [facility]. He also received pressors intraoperatively for which IV was infiltrated with concern for right forearm necrosis. Had episode of sudden chest pain radiating to the left arm. The patient had an equivocal EKG with possible ST elevations and Troponins in the 700s. Cardiology service was consulted for their recommendations, and there was no acute intervention for cath lab. Troponins peaked later that night to the 2000s and later downtrended to 1800s. On POD4 the pigtail chest tube was removed with stable CXR, post-pull chest XR was stable.     Admitted 01/30/24 with sudden onset of chest pain localized in the left side chest radiate to the left arm-8:30 AM yesterday. He took some nitroglycerin, and slept. He had another episode of chest pain today at 9:30 AM, was described as a sharp, persistent. 10/10 in severity. Radiate to the left arm. Worsening shortness of breath where he has to sleep in the recliner. Troponins  only mildly elevated and stable. Cardiology consulted, does not think this is ACS. VQ scan pursued which shows acute pulmonary embolus (segmental in LLL and  subsegmental in RLL. Heparin drip started. Urology consulted. Started on apixaban.   Was in the ER 02/05/24 due to gross hematuria. CT showed no new ureteral stones or any evidence of clots in the bladder. Urology consulted and said to remove the stent. Stent removed with immediate improvement of symptoms.   Most recent ED visit 02/14/24 with right chest pain/ flank pain. CT shows mild area of uncomplicated colitis in the ascending colon.   He presents today for a HF f/u visit with a chief complaint of   At last visit, entresto was changed to valsartan due to hypotension and zio monitor was ordered. He came to office 02/19/24 to have staff place zio and he c/o chest pain. BP was low. Encouraged ER but patient refused. Spironolactone was stopped.   Says that his flank pain has improved (although is still present) since the stent has been removed but his chronic pain "all over" continues. Denies any tobacco, etoh or drug use.   Previous cardiac studies:   Echo 04/30/23: EF 30-35% along with mild LVH and Grade II DD and moderate Ronnie.  Echo 10/13/23: EF 25-30% with moderate LAE, moderate Ronnie  LHC 05/01/23:   Prox LAD to Mid LAD lesion is 99% stenosed.   Mid LAD lesion is 60% stenosed.   1st Diag lesion is 70% stenosed.   Prox RCA lesion is 95% stenosed.   Mid RCA lesion is 30% stenosed.   RPDA lesion is 60% stenosed.   Dist LAD lesion is 99% stenosed.   A drug-eluting stent was successfully placed using a STENT ONYX FRONTIER 4.0X15.   Balloon angioplasty was performed using a BALLN [place] EUPHORA RX 2.75X20.   Post intervention, there is a 20% residual stenosis.   Post intervention, there is a 0% residual stenosis.   There is severe left ventricular systolic dysfunction.   LV end diastolic pressure is severely elevated.   There is moderate (3+) mitral regurgitation.  1.  Severe two-vessel coronary artery disease with subtotal occlusion of the proximal LAD, diffuse moderate mid LAD disease and severe  distal LAD disease.  In addition, there is 95% in the proximal right coronary artery with faint right to left collaterals to the LAD.  The left circumflex has mild nonobstructive disease and OM 3 is large and reaches the apex. 2.  Severely reduced LV systolic function with an EF of 25%.  Severely elevated left ventricular end-diastolic pressure at 34 mmHg. 3.  Successful balloon angioplasty to the proximal LAD and drug-eluting stent placement to the proximal right coronary artery.   ROS: All systems negative except as listed in HPI, PMH and Problem List.  SH:  Social History   Socioeconomic History   Marital status: Divorced    Spouse name: Not on file   Number of children: Not on file   Years of education: Not on file   Highest education level: 10th grade  Occupational History   Not on file  Tobacco Use   Smoking status: Former    Types: Cigarettes    Passive exposure: Never   Smokeless tobacco: Never  Vaping Use   Vaping status: Never Used  Substance and Sexual Activity   Alcohol use: Not Currently    Comment: former moderate drinker   Drug use: Yes    Types: Marijuana   Sexual activity: Not on file  Other Topics Concern   Not on file  Social History Narrative   Not on file   Social Drivers of Health   Financial Resource Strain: Low Risk  (01/23/2024)   Received from [facility]   Overall Financial Resource Strain (CARDIA)    Difficulty of Paying Living Expenses: Not hard at all  Food Insecurity: Food Insecurity Present (02/01/2024)   Hunger Vital Sign    Worried About Running Out of Food in the Last Year: Often true    Ran Out of Food in the Last Year: Often true  Transportation Needs: No Transportation Needs (01/31/2024)   PRAPARE - Administrator, Civil Service (Medical): No    Lack of Transportation (Non-Medical): No  Physical Activity: Not on file  Stress: Not on file  Social Connections: Socially Isolated (01/31/2024)   Social Connection and  Isolation Panel [NHANES]    Frequency of Communication with Friends and Family: Once a week    Frequency of Social Gatherings with Friends and Family: Never    Attends Religious Services: Never    Database administrator or Organizations: No    Attends Banker Meetings: Never    Marital Status: Divorced  Catering manager Violence: Not At Risk (01/31/2024)   Humiliation, Afraid, Rape, and Kick questionnaire    Fear of Current or Ex-Partner: No    Emotionally Abused: No    Physically Abused: No    Sexually Abused: No    FH:  Family History  Problem Relation Age of Onset   Heart disease Father     Past Medical History:  Diagnosis Date   CHF (congestive heart failure) (HCC)    Coronary artery disease 05/01/2023   PCI/DES   HFrEF (heart failure with reduced ejection fraction) (HCC) 05/01/2023   Hiatal hernia    HLD (hyperlipidemia)    HTN (hypertension)    Ischemic cardiomyopathy 05/01/2023   LVEF 30-35%   Kidney stones    Pneumothorax     Current Outpatient Medications  Medication Sig Dispense Refill   albuterol (VENTOLIN HFA) 108 (90 Base) MCG/ACT inhaler Inhale 2 puffs into the lungs every 6 (six) hours as needed for wheezing or shortness of breath. 17 each 0   apixaban (ELIQUIS) 5 MG TABS tablet Take 2 tablets (10 mg total) by mouth 2 (two) times daily for 7 days, THEN 1 tablet (5 mg total) 2 (two) times daily. 120 tablet 1   atorvastatin (LIPITOR) 40 MG tablet Take 1 tablet (40 mg total) by mouth daily. 90 tablet 3   cefdinir (OMNICEF) 300 MG capsule Take 1 capsule (300 mg total) by mouth 2 (two) times daily. 14 capsule 0   furosemide (LASIX) 20 MG tablet Take 2 tablets (40 mg total) by mouth daily.     metoprolol succinate (TOPROL-XL) 25 MG 24 hr tablet Take 1 tablet (25 mg total) by mouth daily. 90 tablet 3   metroNIDAZOLE (FLAGYL) 500 MG tablet Take 1 tablet (500 mg total) by mouth 2 (two) times daily. 14 tablet 0   nitroGLYCERIN (NITROSTAT) 0.4 MG SL tablet  Place 0.4 mg under the tongue every 5 (five) minutes as needed for chest pain.     ondansetron (ZOFRAN-ODT) 4 MG disintegrating tablet Take 1 tablet (4 mg total) by mouth every 8 (eight) hours as needed. 20 tablet 0   senna-docusate (SENOKOT-S) 8.6-50 MG tablet Take 1 tablet by mouth daily as needed for mild constipation. (Patient not taking: Reported on 02/22/2024)  solifenacin (VESICARE) 10 MG tablet Take 10 mg by mouth daily. (Patient not taking: Reported on 02/22/2024)     tamsulosin (FLOMAX) 0.4 MG CAPS capsule Take 1 capsule (0.4 mg total) by mouth daily after supper. 30 capsule 2   traZODone (DESYREL) 50 MG tablet Take 1 tablet (50 mg total) by mouth at bedtime as needed for sleep. 90 tablet 1   valsartan (DIOVAN) 40 MG tablet Take 0.5 tablets (20 mg total) by mouth daily. 45 tablet 3   No current facility-administered medications for this visit.      PHYSICAL EXAM:  General: Ill appearing with gray color to skin. No resp difficulty HEENT: normal Neck: supple, no JVD Cor: Regular rhythm, rate. No rubs, gallops or murmurs Lungs: clear Abdomen: soft, tender, nondistended. Extremities: no cyanosis, clubbing, rash, edema Neuro: alert & oriented X 3. Moves all 4 extremities w/o difficulty. Affect pleasant   ECG: NSR HR 67, RBBB, LBBB (unchanged; personally reviewed)   ASSESSMENT & PLAN:  1: Ischemic heart failure with reduced ejection fraction- - NSTEMI with PCI 06/24 - NYHA class III - euvolemic - weight 189.2 from last visit here 10 days ago - Echo 04/30/23: EF 30-35% along with mild LVH and Grade II DD and moderate Ronnie.  - Echo 10/13/23: EF 25-30% with moderate LAE, moderate Ronnie - continue furosemide 40mg  daily - continue metoprolol succinate 25mg  daily - continue valsartan 20mg  daily - spiro was stopped due to hypotension - due to chronic kidney stone; may not be a good candidate for SGLT2 - BNP 02/05/24 was 214.8  2: CAD- - was NS with cardiology Mariah Milling) 08/24 -  continue atorvastatin 40mg  daily - LHC 05/01/23:   Prox LAD to Mid LAD lesion is 99% stenosed.   Mid LAD lesion is 60% stenosed.   1st Diag lesion is 70% stenosed.   Prox RCA lesion is 95% stenosed.   Mid RCA lesion is 30% stenosed.   RPDA lesion is 60% stenosed.   Dist LAD lesion is 99% stenosed.   A drug-eluting stent was successfully placed using a STENT ONYX FRONTIER 4.0X15.   Balloon angioplasty was performed using a BALLN [place] EUPHORA RX 2.75X20.   Post intervention, there is a 20% residual stenosis.   Post intervention, there is a 0% residual stenosis.   There is severe left ventricular systolic dysfunction.   LV end diastolic pressure is severely elevated.   There is moderate (3+) mitral regurgitation.  1.  Severe two-vessel coronary artery disease with subtotal occlusion of the proximal LAD, diffuse moderate mid LAD disease and severe distal LAD disease.  In addition, there is 95% in the proximal right coronary artery with faint right to left collaterals to the LAD.  The left circumflex has mild nonobstructive disease and OM 3 is large and reaches the apex. 2.  Severely reduced LV systolic function with an EF of 25%.  Severely elevated left ventricular end-diastolic pressure at 34 mmHg. 3.  Successful balloon angioplasty to the proximal LAD and drug-eluting stent placement to the proximal right coronary artery.  3: HTN- - BP  - saw PCP Caralee Ates) 04/25 - BMP 02/14/24 showed sodium 135, potassium 4.1, creatinine 1.73 & GFR 43  4: Flank pain- - followed by [facility] urology (last seen 01/25) - has bilateral staghorn renal stones  - right ureteral stent placed 10/12/23  - PCNL and urethral stent done 01/22/24 with subsequent complication of pneumothorax - urethral stent removed by EDP on 02/05/24 with improvement of severe pain - on apixaban  due to recent PE diagnosis - referral to pain management to assist with his chronic pain  5: Palpitations- - 14 day zio ordered at last  visit     Delma Freeze, FNP 02/25/24

## 2024-02-26 ENCOUNTER — Encounter: Admitting: Family

## 2024-03-01 ENCOUNTER — Encounter: Payer: Self-pay | Admitting: Family

## 2024-03-01 ENCOUNTER — Ambulatory Visit: Attending: Family | Admitting: Family

## 2024-03-01 VITALS — BP 114/62 | HR 66 | Wt 197.4 lb

## 2024-03-01 DIAGNOSIS — R0602 Shortness of breath: Secondary | ICD-10-CM | POA: Diagnosis present

## 2024-03-01 DIAGNOSIS — Z955 Presence of coronary angioplasty implant and graft: Secondary | ICD-10-CM | POA: Diagnosis not present

## 2024-03-01 DIAGNOSIS — Z87442 Personal history of urinary calculi: Secondary | ICD-10-CM | POA: Insufficient documentation

## 2024-03-01 DIAGNOSIS — I252 Old myocardial infarction: Secondary | ICD-10-CM | POA: Diagnosis not present

## 2024-03-01 DIAGNOSIS — I130 Hypertensive heart and chronic kidney disease with heart failure and stage 1 through stage 4 chronic kidney disease, or unspecified chronic kidney disease: Secondary | ICD-10-CM | POA: Diagnosis not present

## 2024-03-01 DIAGNOSIS — R10A0 Flank pain, unspecified side: Secondary | ICD-10-CM

## 2024-03-01 DIAGNOSIS — I2510 Atherosclerotic heart disease of native coronary artery without angina pectoris: Secondary | ICD-10-CM | POA: Insufficient documentation

## 2024-03-01 DIAGNOSIS — Z7901 Long term (current) use of anticoagulants: Secondary | ICD-10-CM | POA: Diagnosis not present

## 2024-03-01 DIAGNOSIS — I10 Essential (primary) hypertension: Secondary | ICD-10-CM

## 2024-03-01 DIAGNOSIS — I25118 Atherosclerotic heart disease of native coronary artery with other forms of angina pectoris: Secondary | ICD-10-CM | POA: Diagnosis not present

## 2024-03-01 DIAGNOSIS — I255 Ischemic cardiomyopathy: Secondary | ICD-10-CM | POA: Diagnosis not present

## 2024-03-01 DIAGNOSIS — Z86711 Personal history of pulmonary embolism: Secondary | ICD-10-CM | POA: Insufficient documentation

## 2024-03-01 DIAGNOSIS — I5022 Chronic systolic (congestive) heart failure: Secondary | ICD-10-CM

## 2024-03-01 DIAGNOSIS — Z87891 Personal history of nicotine dependence: Secondary | ICD-10-CM | POA: Diagnosis not present

## 2024-03-01 DIAGNOSIS — Z960 Presence of urogenital implants: Secondary | ICD-10-CM | POA: Diagnosis not present

## 2024-03-01 DIAGNOSIS — E785 Hyperlipidemia, unspecified: Secondary | ICD-10-CM | POA: Diagnosis not present

## 2024-03-01 DIAGNOSIS — R109 Unspecified abdominal pain: Secondary | ICD-10-CM | POA: Diagnosis not present

## 2024-03-01 DIAGNOSIS — N189 Chronic kidney disease, unspecified: Secondary | ICD-10-CM | POA: Insufficient documentation

## 2024-03-01 DIAGNOSIS — R002 Palpitations: Secondary | ICD-10-CM

## 2024-03-01 NOTE — Progress Notes (Signed)
 Advanced [facility] Failure Clinic Note    PCP: Margarita Mail, DO (last seen 04/25) Primary Cardiologist: Julien Nordmann, MD (saw during 06/24 admission)  Chief Complaint: shortness of breath   HPI:  Mr Dettmer is a 68 y/o male with a history of NSTEMI, s/p PCI to proximal LAD and RCA on 05/01/23, HFrEF (EF 30 to 35%), previous tobacco/ alcohol use, HTN, CKD, UTI, hyperlipidemia, kidney stones since the age of 34, right uretal stone s/p stent 10/12/23, bilateral nephrolithiasis and pulmonary embolus (03/25).   Admitted 04/28/23 due to new onset of substernal "vice like" substernal chest pain that radiated to his left arm. He reports ongoing shortness of breath associated with this. He also was awoken from sleep with this last night and had broken out into a cold sweat. Elevated lactic acid and elevated WBC at 14.4. 1 L of IVF given. Initial troponin was 54 rose to 234. 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 Developed flash pulmonary edema during left heart cath 06/10 and was treated with nitroglycerin drip. Diuretics held. Status post successful balloon angioplasty to the proximal LAD and drug-eluting stent placement to the proximal right coronary artery. Treated for pneumonia with antibiotics. Was in the ED 05/13/23 due to bilateral flank and suprapubic pain. CT renal was stable. Admitted 05/16/23 due to shortness of breath, bilateral flank pain radiating towards lower abdomen, more on right than left, dysuria and gross hematuria. Patient deferred urology stent placement. Able to void after foley removed. Brilinta was switched to Plavix, aspirin discontinued in the setting of hematuria by cardiologist    Admitted 10/10/23 due to shortness of breath, hematuria and flank pain. Started on IV lasix for HF exacerbation. Cardiology consulted and feel respiratory symptoms more related to bronchitis. Lasix held and solu-medrol provided. Respiratory panel negative.  Urology consulted and stent placed. Losartan was changed to entresto.   Admitted 11/04/23 due to right flank pain. RUQ sono showed cholelithiasis with positive sonographic Murphy's borderline thickening of gallbladder wall at fundus suspicious for acute cholecystitis.  CT renal stone study showed right ureteral stent in position, persistent calculi in right ureter, pelvis and proximal mid left ureter.  Surgery team evaluated the patient in the ED, advised HIDA scan which ruled out acute cholecystitis. Entresto, lasix and spironolactone held due to low BP. To follow up with [facility] urology early January 2025.  Has had 2 ED visits 01/25 due to pain from his kidney stone  Had PCNL and ureteral stent placed 01/22/24 complicated by intraoperative pneumothorax requiring right-sided chest tube placement with Interventional Radiology on 01/22/2024 at [facility]. He also received pressors intraoperatively for which IV was infiltrated with concern for right forearm necrosis. Had episode of sudden chest pain radiating to the left arm. The patient had an equivocal EKG with possible ST elevations and Troponins in the 700s. Cardiology service was consulted for their recommendations, and there was no acute intervention for cath lab. Troponins peaked later that night to the 2000s and later downtrended to 1800s. On POD4 the pigtail chest tube was removed with stable CXR, post-pull chest XR was stable.     Admitted 01/30/24 with sudden onset of chest pain localized in the left side chest radiate to the left arm-8:30 AM yesterday. He took some nitroglycerin, and slept. He had another episode of chest pain today at 9:30 AM, was described as a sharp, persistent. 10/10 in severity. Radiate to the left arm. Worsening shortness of breath where he has to sleep in  the recliner. Troponins only mildly elevated and stable. Cardiology consulted, does not think this is ACS. VQ scan pursued which shows acute pulmonary embolus  (segmental in LLL and subsegmental in RLL. Heparin drip started. Urology consulted. Started on apixaban.   Was in the ER 02/05/24 due to gross hematuria. CT showed no new ureteral stones or any evidence of clots in the bladder. Urology consulted and said to remove the stent. Stent removed with immediate improvement of symptoms.   Most recent ED visit 02/14/24 with right chest pain/ flank pain. CT shows mild area of uncomplicated colitis in the ascending colon.   He came to office 02/19/24 to have staff place zio and he c/o chest pain. BP was low. Encouraged ER but patient refused. Spironolactone was stopped.   He presents today for a HF f/u visit with a chief complaint of minimal shortness of breath (improving). Has associated fatigue, occasional chest pain, palpitations and dizziness along with this. Denies cough, abdominal distention, pedal edema or difficulty sleeping. Mentions that his appetite is much improved and he has been eating "everything" over the last few days. Overall he says that he feels "so much better" and has only taken 1 pain medication in the last week. Has had his lasix decreased due to hypotension. At last visit, entresto was changed to valsartan due to hypotension and zio monitor was ordered.   At previous visit, pain clinic referral was placed. He hasn't heard anything from them but would like to defer this for now since he's feeling so much better.   Previous cardiac studies:   Echo 04/30/23: EF 30-35% along with mild LVH and Grade II DD and moderate MR.  Echo 10/13/23: EF 25-30% with moderate LAE, moderate MR  LHC 05/01/23:   Prox LAD to Mid LAD lesion is 99% stenosed.   Mid LAD lesion is 60% stenosed.   1st Diag lesion is 70% stenosed.   Prox RCA lesion is 95% stenosed.   Mid RCA lesion is 30% stenosed.   RPDA lesion is 60% stenosed.   Dist LAD lesion is 99% stenosed.   A drug-eluting stent was successfully placed using a STENT ONYX FRONTIER 4.0X15.   Balloon  angioplasty was performed using a BALLN [place] EUPHORA RX 2.75X20.   Post intervention, there is a 20% residual stenosis.   Post intervention, there is a 0% residual stenosis.   There is severe left ventricular systolic dysfunction.   LV end diastolic pressure is severely elevated.   There is moderate (3+) mitral regurgitation.  1.  Severe two-vessel coronary artery disease with subtotal occlusion of the proximal LAD, diffuse moderate mid LAD disease and severe distal LAD disease.  In addition, there is 95% in the proximal right coronary artery with faint right to left collaterals to the LAD.  The left circumflex has mild nonobstructive disease and OM 3 is large and reaches the apex. 2.  Severely reduced LV systolic function with an EF of 25%.  Severely elevated left ventricular end-diastolic pressure at 34 mmHg. 3.  Successful balloon angioplasty to the proximal LAD and drug-eluting stent placement to the proximal right coronary artery.   ROS: All systems negative except as listed in HPI, PMH and Problem List.  SH:  Social History   Socioeconomic History   Marital status: Divorced    Spouse name: Not on file   Number of children: Not on file   Years of education: Not on file   Highest education level: 10th grade  Occupational History  Not on file  Tobacco Use   Smoking status: Former    Types: Cigarettes    Passive exposure: Never   Smokeless tobacco: Never  Vaping Use   Vaping status: Never Used  Substance and Sexual Activity   Alcohol use: Not Currently    Comment: former moderate drinker   Drug use: Yes    Types: Marijuana   Sexual activity: Not on file  Other Topics Concern   Not on file  Social History Narrative   Not on file   Social Drivers of Health   Financial Resource Strain: Low Risk  (01/23/2024)   Received from [facility]   Overall Financial Resource Strain (CARDIA)    Difficulty of Paying Living Expenses: Not hard at all  Food Insecurity: Food Insecurity  Present (02/01/2024)   Hunger Vital Sign    Worried About Running Out of Food in the Last Year: Often true    Ran Out of Food in the Last Year: Often true  Transportation Needs: No Transportation Needs (01/31/2024)   PRAPARE - Administrator, Civil Service (Medical): No    Lack of Transportation (Non-Medical): No  Physical Activity: Not on file  Stress: Not on file  Social Connections: Socially Isolated (01/31/2024)   Social Connection and Isolation Panel [NHANES]    Frequency of Communication with Friends and Family: Once a week    Frequency of Social Gatherings with Friends and Family: Never    Attends Religious Services: Never    Database administrator or Organizations: No    Attends Banker Meetings: Never    Marital Status: Divorced  Catering manager Violence: Not At Risk (01/31/2024)   Humiliation, Afraid, Rape, and Kick questionnaire    Fear of Current or Ex-Partner: No    Emotionally Abused: No    Physically Abused: No    Sexually Abused: No    FH:  Family History  Problem Relation Age of Onset   Heart disease Father     Past Medical History:  Diagnosis Date   CHF (congestive heart failure) (HCC)    Coronary artery disease 05/01/2023   PCI/DES   HFrEF (heart failure with reduced ejection fraction) (HCC) 05/01/2023   Hiatal hernia    HLD (hyperlipidemia)    HTN (hypertension)    Ischemic cardiomyopathy 05/01/2023   LVEF 30-35%   Kidney stones    Pneumothorax     Current Outpatient Medications  Medication Sig Dispense Refill   albuterol (VENTOLIN HFA) 108 (90 Base) MCG/ACT inhaler Inhale 2 puffs into the lungs every 6 (six) hours as needed for wheezing or shortness of breath. 17 each 0   apixaban (ELIQUIS) 5 MG TABS tablet Take 2 tablets (10 mg total) by mouth 2 (two) times daily for 7 days, THEN 1 tablet (5 mg total) 2 (two) times daily. 120 tablet 1   atorvastatin (LIPITOR) 40 MG tablet Take 1 tablet (40 mg total) by mouth daily. 90  tablet 3   furosemide (LASIX) 20 MG tablet Take 2 tablets (40 mg total) by mouth daily.     metoprolol succinate (TOPROL-XL) 25 MG 24 hr tablet Take 1 tablet (25 mg total) by mouth daily. 90 tablet 3   nitroGLYCERIN (NITROSTAT) 0.4 MG SL tablet Place 0.4 mg under the tongue every 5 (five) minutes as needed for chest pain.     ondansetron (ZOFRAN-ODT) 4 MG disintegrating tablet Take 1 tablet (4 mg total) by mouth every 8 (eight) hours as needed. 20 tablet  0   solifenacin (VESICARE) 10 MG tablet Take 10 mg by mouth daily.     tamsulosin (FLOMAX) 0.4 MG CAPS capsule Take 1 capsule (0.4 mg total) by mouth daily after supper. 30 capsule 2   traZODone (DESYREL) 50 MG tablet Take 1 tablet (50 mg total) by mouth at bedtime as needed for sleep. 90 tablet 1   valsartan (DIOVAN) 40 MG tablet Take 0.5 tablets (20 mg total) by mouth daily. 45 tablet 3   No current facility-administered medications for this visit.   Vitals:   03/01/24 1203  BP: 114/62  Pulse: 66  SpO2: 97%  Weight: 197 lb 6.4 oz (89.5 kg)   Wt Readings from Last 3 Encounters:  03/01/24 197 lb 6.4 oz (89.5 kg)  02/22/24 190 lb 11.2 oz (86.5 kg)  02/16/24 189 lb 3.2 oz (85.8 kg)   Lab Results  Component Value Date   CREATININE 1.73 (H) 02/14/2024   CREATININE 1.51 (H) 02/10/2024   CREATININE 1.61 (H) 02/05/2024    PHYSICAL EXAM:  General: Well appearing. No resp difficulty HEENT: normal Neck: supple, no JVD Cor: Regular rhythm, rate. No rubs, gallops or murmurs Lungs: clear Abdomen: soft, nontender, nondistended. Extremities: no cyanosis, clubbing, rash, edema Neuro: alert & oriented X 3. Moves all 4 extremities w/o difficulty. Affect pleasant   ECG: not done   ASSESSMENT & PLAN:  1: Ischemic heart failure with reduced ejection fraction- - NSTEMI with PCI 06/24 - NYHA class II - euvolemic - weight up 8 pounds from last visit here 10 days ago; patient says that he's eating "everything" because his pain has lessened  and appetite has improved - Echo 04/30/23: EF 30-35% along with mild LVH and Grade II DD and moderate MR.  - Echo 10/13/23: EF 25-30% with moderate LAE, moderate MR - continue furosemide 20mg  daily - continue metoprolol succinate 25mg  daily - continue valsartan 20mg  daily - spiro was previously stopped due to hypotension - BMET today - due to chronic kidney stone; may not be a good candidate for SGLT2 - BNP 02/05/24 was 214.8  2: CAD- - was NS with cardiology Mariah Milling) 08/24; cardiology referral placed today - continue atorvastatin 40mg  daily - LDL 10/11/23 was 122 - LHC 05/01/23:   Prox LAD to Mid LAD lesion is 99% stenosed.   Mid LAD lesion is 60% stenosed.   1st Diag lesion is 70% stenosed.   Prox RCA lesion is 95% stenosed.   Mid RCA lesion is 30% stenosed.   RPDA lesion is 60% stenosed.   Dist LAD lesion is 99% stenosed.   A drug-eluting stent was successfully placed using a STENT ONYX FRONTIER 4.0X15.   Balloon angioplasty was performed using a BALLN [place] EUPHORA RX 2.75X20.   Post intervention, there is a 20% residual stenosis.   Post intervention, there is a 0% residual stenosis.   There is severe left ventricular systolic dysfunction.   LV end diastolic pressure is severely elevated.   There is moderate (3+) mitral regurgitation.  1.  Severe two-vessel coronary artery disease with subtotal occlusion of the proximal LAD, diffuse moderate mid LAD disease and severe distal LAD disease.  In addition, there is 95% in the proximal right coronary artery with faint right to left collaterals to the LAD.  The left circumflex has mild nonobstructive disease and OM 3 is large and reaches the apex. 2.  Severely reduced LV systolic function with an EF of 25%.  Severely elevated left ventricular end-diastolic pressure at 34 mmHg. 3.  Successful balloon angioplasty to the proximal LAD and drug-eluting stent placement to the proximal right coronary artery.  3: HTN- - BP 114/62 - saw PCP  Caralee Ates) 04/25 - BMP 02/14/24 showed sodium 135, potassium 4.1, creatinine 1.73 & GFR 43 - BMET today  4: Flank pain- - followed by [facility]rology (last seen 01/25) - has bilateral staghorn renal stones  - right ureteral stent placed 10/12/23  - PCNL and urethral stent done 01/22/24 with subsequent complication of pneumothorax - urethral stent removed by EDP on 02/05/24 with improvement of severe pain - on apixaban 5mg  BID due to PE diagnosis - pain under much better control; defers follow-up with pain clinic at this time  5: Palpitations- - 14 day zio placed 02/19/24   Return in 1 month, sooner if needed.   Delma Freeze, FNP 03/01/24

## 2024-03-01 NOTE — Patient Instructions (Signed)
 Medication Changes:  No medication changes today!  Lab Work:   Go over to the [facility]. Go pass the gift shop and have your blood work completed IN ONE WEEK.  We will only call you if the results are abnormal or if the provider would like to make medication changes.   Referrals:  You have been referred to [facility] with Dr. Mariah Milling. That office will contact you in order to schedule your appointment.   Follow-Up in: Please follow up with the [facility] in 1 month with Clarisa Kindred, NP. Your appointment has been moved.   At the [facility], you and your health needs are our priority. We have a designated team specialized in the treatment of Heart Failure. This Care Team includes your primary Heart Failure Specialized Cardiologist (physician), Advanced Practice Providers (APPs- Physician Assistants and Nurse Practitioners), and Pharmacist who all work together to provide you with the care you need, when you need it.   You may see any of the following providers on your designated Care Team at your next follow up:  Dr. Arvilla Meres Dr. Marca Ancona Dr. Dorthula Nettles Dr. Theresia Bough Clarisa Kindred, FNP Enos Fling, RPH-CPP  Please be sure to bring in all your medications bottles to every appointment.   Need to Contact us:  If you have any questions or concerns before your next appointment please send [place] a message through [place] or call our office at [phone].    TO LEAVE A MESSAGE FOR THE NURSE SELECT OPTION 2, PLEASE LEAVE A MESSAGE INCLUDING: YOUR NAME DATE OF BIRTH CALL BACK NUMBER REASON FOR CALL**this is important as we prioritize the call backs  YOU WILL RECEIVE A CALL BACK THE SAME DAY AS LONG AS YOU CALL BEFORE 4:00 PM

## 2024-03-05 ENCOUNTER — Telehealth: Payer: Self-pay | Admitting: Family

## 2024-03-06 ENCOUNTER — Other Ambulatory Visit
Admission: RE | Admit: 2024-03-06 | Discharge: 2024-03-06 | Disposition: A | Discharge status: Home / Self Care | Source: Ambulatory Visit | Attending: Family | Admitting: Family

## 2024-03-06 DIAGNOSIS — I5022 Chronic systolic (congestive) heart failure: Secondary | ICD-10-CM | POA: Diagnosis present

## 2024-03-06 LAB — BASIC METABOLIC PANEL WITH GFR
Anion gap: 5 (ref 5–15)
BUN: 24 mg/dL — ABNORMAL HIGH (ref 8–23)
CO2: 23 mmol/L (ref 22–32)
Calcium: 9 mg/dL (ref 8.9–10.3)
Chloride: 106 mmol/L (ref 98–111)
Creatinine, Ser: 1.28 mg/dL — ABNORMAL HIGH (ref 0.61–1.24)
GFR, Estimated: >60 mL/min (ref >60)
Glucose, Bld: 119 mg/dL — ABNORMAL HIGH (ref 70–99)
Potassium: 4.3 mmol/L (ref 3.5–5.1)
Sodium: 134 mmol/L — ABNORMAL LOW (ref 135–145)

## 2024-03-12 ENCOUNTER — Telehealth: Payer: Self-pay | Admitting: Family

## 2024-03-12 ENCOUNTER — Other Ambulatory Visit: Payer: Self-pay

## 2024-03-12 MED ORDER — NITROGLYCERIN 0.4 MG SL SUBL
0.4000 mg | SUBLINGUAL_TABLET | SUBLINGUAL | 5 refills | Status: AC | PRN
  Filled 2024-03-12: qty 25, 8d supply, fill #0
  Filled 2024-08-14: qty 25, 8d supply, fill #1

## 2024-03-12 NOTE — Telephone Encounter (Signed)
 Attempted to call pt to discuss monitor troubles. Pt did not answer. VM not set up. Unable to reach pt.

## 2024-03-13 ENCOUNTER — Other Ambulatory Visit: Payer: Self-pay

## 2024-03-14 ENCOUNTER — Encounter: Admitting: Family

## 2024-03-22 NOTE — Addendum Note (Signed)
 Encounter addended by: Stan Eans, RN on: 03/22/2024 2:40 PM  Actions taken: Imaging Exam ended

## 2024-03-22 NOTE — Addendum Note (Signed)
 Encounter addended by: Stan Eans, RN on: 03/22/2024 2:38 PM  Actions taken: Imaging Exam ended

## 2024-03-29 ENCOUNTER — Telehealth: Payer: Self-pay | Admitting: Family

## 2024-03-29 NOTE — Progress Notes (Deleted)
 Advanced Heart Failure Clinic Note    PCP: [name], DO (last seen 04/25) Primary Cardiologist: Timothy Gollan, MD (saw during 06/24 admission)  Chief Complaint: shortness of breath   HPI:  Ronnie Mann is a 68 y/o male with a history of NSTEMI, s/p PCI to proximal LAD and RCA on 05/01/23, HFrEF (EF 30 to 35%), previous tobacco/ alcohol use, HTN, CKD, UTI, hyperlipidemia, kidney stones since the age of 43, right uretal stone s/p stent 10/12/23, bilateral nephrolithiasis and pulmonary embolus (03/25).   Admitted 04/28/23 due to new onset of substernal "vice like" substernal chest pain that radiated to his left arm. He reports ongoing shortness of breath associated with this. He also was awoken from sleep with this last night and had broken out into a cold sweat. Elevated lactic acid and elevated WBC at 14.4. 1 L of IVF given. Initial troponin was 54 rose to 234. 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 Developed flash pulmonary edema during left heart cath 06/10 and was treated with nitroglycerin  drip. Diuretics held. Status post successful balloon angioplasty to the proximal LAD and drug-eluting stent placement to the proximal right coronary artery. Treated for pneumonia with antibiotics. Was in the ED 05/13/23 due to bilateral flank and suprapubic pain. CT renal was stable. Admitted 05/16/23 due to shortness of breath, bilateral flank pain radiating towards lower abdomen, more on right than left, dysuria and gross hematuria. Patient deferred urology stent placement. Able to void after foley removed. Brilinta  was switched to Plavix , aspirin  discontinued in the setting of hematuria by cardiologist    Admitted 10/10/23 due to shortness of breath, hematuria and flank pain. Started on IV lasix  for HF exacerbation. Cardiology consulted and feel respiratory symptoms more related to bronchitis. Lasix  held and solu-medrol  provided. Respiratory panel negative.  Urology consulted and stent placed. Losartan  was changed to entresto .   Admitted 11/04/23 due to right flank pain. RUQ sono showed cholelithiasis with positive sonographic Murphy's borderline thickening of gallbladder wall at fundus suspicious for acute cholecystitis.  CT renal stone study showed right ureteral stent in position, persistent calculi in right ureter, pelvis and proximal mid left ureter.  Surgery team evaluated the patient in the ED, advised HIDA scan which ruled out acute cholecystitis. Entresto , lasix  and spironolactone  held due to low BP. To follow up with [facility] urology early January 2025.  Has had 2 ED visits 01/25 due to pain from his kidney stone  Had PCNL and ureteral stent placed 01/22/24 complicated by intraoperative pneumothorax requiring right-sided chest tube placement with Interventional Radiology on 01/22/2024 at [facility]. He also received pressors intraoperatively for which IV was infiltrated with concern for right forearm necrosis. Had episode of sudden chest pain radiating to the left arm. The patient had an equivocal EKG with possible ST elevations and Troponins in the 700s. Cardiology service was consulted for their recommendations, and there was no acute intervention for cath lab. Troponins peaked later that night to the 2000s and later downtrended to 1800s. On POD4 the pigtail chest tube was removed with stable CXR, post-pull chest XR was stable.     Admitted 01/30/24 with sudden onset of chest pain localized in the left side chest radiate to the left arm-8:30 AM yesterday. He took some nitroglycerin , and slept. He had another episode of chest pain today at 9:30 AM, was described as a sharp, persistent. 10/10 in severity. Radiate to the left arm. Worsening shortness of breath where he has to sleep in  the recliner. Troponins only mildly elevated and stable. Cardiology consulted, does not think this is ACS. VQ scan pursued which shows acute pulmonary embolus  (segmental in LLL and subsegmental in RLL. Heparin  drip started. Urology consulted. Started on apixaban .   Was in the ER 02/05/24 due to gross hematuria. CT showed no new ureteral stones or any evidence of clots in the bladder. Urology consulted and said to remove the stent. Stent removed with immediate improvement of symptoms.   Most recent ED visit 02/14/24 with right chest pain/ flank pain. CT shows mild area of uncomplicated colitis in the ascending colon.   He came to office 02/19/24 to have staff place zio and he c/o chest pain. BP was low. Encouraged ER but patient refused. Spironolactone  was stopped.   He presents today for a HF f/u visit with a chief complaint of minimal shortness of breath (improving). Has associated fatigue, occasional chest pain, palpitations and dizziness along with this. Denies cough, abdominal distention, pedal edema or difficulty sleeping. Mentions that his appetite is much improved and he has been eating "everything" over the last few days. Overall he says that he feels "so much better" and has only taken 1 pain medication in the last week. Has had his lasix  decreased due to hypotension. At last visit, entresto  was changed to valsartan  due to hypotension and zio monitor was ordered.   At previous visit, pain clinic referral was placed. He hasn't heard anything from them but would like to defer this for now since he's feeling so much better.   Previous cardiac studies:   Echo 04/30/23: EF 30-35% along with mild LVH and Grade II DD and moderate Ronnie.  Echo 10/13/23: EF 25-30% with moderate LAE, moderate Ronnie  LHC 05/01/23:   Prox LAD to Mid LAD lesion is 99% stenosed.   Mid LAD lesion is 60% stenosed.   1st Diag lesion is 70% stenosed.   Prox RCA lesion is 95% stenosed.   Mid RCA lesion is 30% stenosed.   RPDA lesion is 60% stenosed.   Dist LAD lesion is 99% stenosed.   A drug-eluting stent was successfully placed using a STENT ONYX FRONTIER 4.0X15.   Balloon  angioplasty was performed using a BALLN [place] EUPHORA RX 2.75X20.   Post intervention, there is a 20% residual stenosis.   Post intervention, there is a 0% residual stenosis.   There is severe left ventricular systolic dysfunction.   LV end diastolic pressure is severely elevated.   There is moderate (3+) mitral regurgitation.  1.  Severe two-vessel coronary artery disease with subtotal occlusion of the proximal LAD, diffuse moderate mid LAD disease and severe distal LAD disease.  In addition, there is 95% in the proximal right coronary artery with faint right to left collaterals to the LAD.  The left circumflex has mild nonobstructive disease and OM 3 is large and reaches the apex. 2.  Severely reduced LV systolic function with an EF of 25%.  Severely elevated left ventricular end-diastolic pressure at 34 mmHg. 3.  Successful balloon angioplasty to the proximal LAD and drug-eluting stent placement to the proximal right coronary artery.   ROS: All systems negative except as listed in HPI, PMH and Problem List.  SH:  Social History   Socioeconomic History   Marital status: Divorced    Spouse name: Not on file   Number of children: Not on file   Years of education: Not on file   Highest education level: 10th grade  Occupational History  Not on file  Tobacco Use   Smoking status: Former    Types: Cigarettes    Passive exposure: Never   Smokeless tobacco: Never  Vaping Use   Vaping status: Never Used  Substance and Sexual Activity   Alcohol use: Not Currently    Comment: former moderate drinker   Drug use: Yes    Types: Marijuana   Sexual activity: Not on file  Other Topics Concern   Not on file  Social History Narrative   Not on file   Social Drivers of Health   Financial Resource Strain: Low Risk  (01/23/2024)   Received from [facility]   Overall Financial Resource Strain (CARDIA)    Difficulty of Paying Living Expenses: Not hard at all  Food Insecurity: Food Insecurity  Present (02/01/2024)   Hunger Vital Sign    Worried About Running Out of Food in the Last Year: Often true    Ran Out of Food in the Last Year: Often true  Transportation Needs: No Transportation Needs (01/31/2024)   PRAPARE - Administrator, Civil Service (Medical): No    Lack of Transportation (Non-Medical): No  Physical Activity: Not on file  Stress: Not on file  Social Connections: Socially Isolated (01/31/2024)   Social Connection and Isolation Panel [NHANES]    Frequency of Communication with Friends and Family: Once a week    Frequency of Social Gatherings with Friends and Family: Never    Attends Religious Services: Never    Database administrator or Organizations: No    Attends Banker Meetings: Never    Marital Status: Divorced  Catering manager Violence: Not At Risk (01/31/2024)   Humiliation, Afraid, Rape, and Kick questionnaire    Fear of Current or Ex-Partner: No    Emotionally Abused: No    Physically Abused: No    Sexually Abused: No    FH:  Family History  Problem Relation Age of Onset   Heart disease Father     Past Medical History:  Diagnosis Date   CHF (congestive heart failure) (HCC)    Coronary artery disease 05/01/2023   PCI/DES   HFrEF (heart failure with reduced ejection fraction) (HCC) 05/01/2023   Hiatal hernia    HLD (hyperlipidemia)    HTN (hypertension)    Ischemic cardiomyopathy 05/01/2023   LVEF 30-35%   Kidney stones    Pneumothorax     Current Outpatient Medications  Medication Sig Dispense Refill   albuterol  (VENTOLIN  HFA) 108 (90 Base) MCG/ACT inhaler Inhale 2 puffs into the lungs every 6 (six) hours as needed for wheezing or shortness of breath. 17 each 0   apixaban  (ELIQUIS ) 5 MG TABS tablet Take 2 tablets (10 mg total) by mouth 2 (two) times daily for 7 days, THEN 1 tablet (5 mg total) 2 (two) times daily. 120 tablet 1   atorvastatin  (LIPITOR) 40 MG tablet Take 1 tablet (40 mg total) by mouth daily. 90  tablet 3   furosemide  (LASIX ) 20 MG tablet Take 2 tablets (40 mg total) by mouth daily. (Patient taking differently: Take 20 mg by mouth daily.)     metoprolol  succinate (TOPROL -XL) 25 MG 24 hr tablet Take 1 tablet (25 mg total) by mouth daily. 90 tablet 3   nitroGLYCERIN  (NITROSTAT ) 0.4 MG SL tablet Place 1 tablet (0.4 mg total) under the tongue every 5 (five) minutes as needed for chest pain. 25 tablet 5   ondansetron  (ZOFRAN -ODT) 4 MG disintegrating tablet Take 1 tablet (  4 mg total) by mouth every 8 (eight) hours as needed. 20 tablet 0   solifenacin (VESICARE) 10 MG tablet Take 10 mg by mouth daily.     tamsulosin  (FLOMAX ) 0.4 MG CAPS capsule Take 1 capsule (0.4 mg total) by mouth daily after supper. 30 capsule 2   traZODone  (DESYREL ) 50 MG tablet Take 1 tablet (50 mg total) by mouth at bedtime as needed for sleep. 90 tablet 1   valsartan  (DIOVAN ) 40 MG tablet Take 0.5 tablets (20 mg total) by mouth daily. 45 tablet 3   No current facility-administered medications for this visit.   There were no vitals filed for this visit.  Wt Readings from Last 3 Encounters:  03/01/24 197 lb 6.4 oz (89.5 kg)  02/22/24 190 lb 11.2 oz (86.5 kg)  02/16/24 189 lb 3.2 oz (85.8 kg)   Lab Results  Component Value Date   CREATININE 1.28 (H) 03/06/2024   CREATININE 1.73 (H) 02/14/2024   CREATININE 1.51 (H) 02/10/2024    PHYSICAL EXAM:  General: Well appearing. No resp difficulty HEENT: normal Neck: supple, no JVD Cor: Regular rhythm, rate. No rubs, gallops or murmurs Lungs: clear Abdomen: soft, nontender, nondistended. Extremities: no cyanosis, clubbing, rash, edema Neuro: alert & oriented X 3. Moves all 4 extremities w/o difficulty. Affect pleasant   ECG: not done   ASSESSMENT & PLAN:  1: Ischemic heart failure with reduced ejection fraction- - NSTEMI with PCI 06/24 - NYHA class II - euvolemic - weight up 8 pounds from last visit here 10 days ago; patient says that he's eating "everything"  because his pain has lessened and appetite has improved - Echo 04/30/23: EF 30-35% along with mild LVH and Grade II DD and moderate Ronnie.  - Echo 10/13/23: EF 25-30% with moderate LAE, moderate Ronnie - continue furosemide  20mg  daily - continue metoprolol  succinate 25mg  daily - continue valsartan  20mg  daily - spiro was previously stopped due to hypotension - BMET today - due to chronic kidney stone; may not be a good candidate for SGLT2 - BNP 02/05/24 was 214.8  2: CAD- - was NS with cardiology Ronnie Mann) 08/24; cardiology referral placed today - continue atorvastatin  40mg  daily - LDL 10/11/23 was 122 - LHC 05/01/23:   Prox LAD to Mid LAD lesion is 99% stenosed.   Mid LAD lesion is 60% stenosed.   1st Diag lesion is 70% stenosed.   Prox RCA lesion is 95% stenosed.   Mid RCA lesion is 30% stenosed.   RPDA lesion is 60% stenosed.   Dist LAD lesion is 99% stenosed.   A drug-eluting stent was successfully placed using a STENT ONYX FRONTIER 4.0X15.   Balloon angioplasty was performed using a BALLN [place] EUPHORA RX 2.75X20.   Post intervention, there is a 20% residual stenosis.   Post intervention, there is a 0% residual stenosis.   There is severe left ventricular systolic dysfunction.   LV end diastolic pressure is severely elevated.   There is moderate (3+) mitral regurgitation.  1.  Severe two-vessel coronary artery disease with subtotal occlusion of the proximal LAD, diffuse moderate mid LAD disease and severe distal LAD disease.  In addition, there is 95% in the proximal right coronary artery with faint right to left collaterals to the LAD.  The left circumflex has mild nonobstructive disease and OM 3 is large and reaches the apex. 2.  Severely reduced LV systolic function with an EF of 25%.  Severely elevated left ventricular end-diastolic pressure at 34 mmHg. 3.  Successful  balloon angioplasty to the proximal LAD and drug-eluting stent placement to the proximal right coronary artery.  3: HTN- -  BP 114/62 - saw PCP [facility]) 04/25 - BMP 02/14/24 showed sodium 135, potassium 4.1, creatinine 1.73 & GFR 43 - BMET today  4: Flank pain- - followed by [facility] urology (last seen 01/25) - has bilateral staghorn renal stones  - right ureteral stent placed 10/12/23  - PCNL and urethral stent done 01/22/24 with subsequent complication of pneumothorax - urethral stent removed by EDP on 02/05/24 with improvement of severe pain - on apixaban  5mg  BID due to PE diagnosis - pain under much better control; defers follow-up with pain clinic at this time  5: Palpitations- - 14 day zio placed 02/19/24   Return in 1 month, sooner if needed.   Charlette Console, FNP 03/29/24

## 2024-03-29 NOTE — Telephone Encounter (Signed)
 Called to confirm/remind patient of their appointment at the Advanced Heart Failure Clinic on 04/01/24.   Appointment:   [] Confirmed  [] Left mess   [] No answer/No voice mail  [x] VM Full/unable to leave message  [] Phone not in service  Patient reminded to bring all medications and/or complete list.  Confirmed patient has transportation. Gave directions, instructed to utilize valet parking.

## 2024-04-01 ENCOUNTER — Telehealth: Payer: Self-pay | Admitting: Family

## 2024-04-01 ENCOUNTER — Encounter: Admitting: Family

## 2024-04-01 NOTE — Progress Notes (Incomplete)
 [facility] - HEART FAILURE CLINIC - PHARMACIST COUNSELING NOTE  Ronnie Mann is a 68 y.o. year old male who presents to the HF clinic for one month follow-up appointment. They have a past medical history significant for NSTEMI, s/p PCI to proximal LAD and RCA on 05/01/23, HFrEF (EF 30 to 35%), previous tobacco/ alcohol use, HTN, CKD, UTI, hyperlipidemia, kidney stones since the age of 74, right uretal stone s/p stent 10/12/23, bilateral nephrolithiasis and pulmonary embolus (03/25). Initially, they were diagnosed with HFrEF on *** secondary to *** with an EF of ***. At their last appointment on ***   Current Guideline-Directed Medical Therapy (GDMT)  ACE/ARB/ARNI: Valsartan  20 mg daily Beta Blocker: Metoprolol  succinate 25 mg daily Aldosterone Antagonist: NA due to hypotension  Diuretic: Furosemide  20 mg daily SGLT2i: NA- not a good candidate due to chronic kidney stones   Vital Signs and Trends  Recent Trends BP Readings from Last 3 Encounters:  03/01/24 114/62  02/22/24 120/74  02/16/24 101/61   Wt Readings from Last 3 Encounters:  03/01/24 197 lb 6.4 oz (89.5 kg)  02/22/24 190 lb 11.2 oz (86.5 kg)  02/16/24 189 lb 3.2 oz (85.8 kg)   Today's visit HR *** BP *** Weight (pounds) ***  Do you check your weight daily? [] Yes [] No  Do you check you blood pressure daily [] Yes [] No   Cardiac Imaging  - Echo 04/30/23: EF 30-35% along with mild LVH and Grade II DD and moderate MR.  - Echo 10/13/23: EF 25-30% with moderate LAE, moderate MR  Changes Since Last Visit   Hospitalizations/ED visits (last 6 months): Date ***, CC *** Medication changes: NA  Pertinent Labs  Lab Results  Component Value Date   NA 134 (L) 03/06/2024   CL 106 03/06/2024   K 4.3 03/06/2024   CO2 23 03/06/2024   BUN 24 (H) 03/06/2024   CREATININE 1.28 (H) 03/06/2024   GFRNONAA >60 03/06/2024   CALCIUM  9.0 03/06/2024   PHOS 2.7 04/30/2023   ALBUMIN  4.2 02/05/2024   GLUCOSE 119 (H)  03/06/2024   Lab Results  Component Value Date   LDLCALC 122 (H) 10/11/2023   Lab Results  Component Value Date   HGBA1C 6.1 (H) 10/10/2023    ASSESSMENT  Reason for today's visit   Patient presents alone/with family to assess current guideline directed medical therapy. This is a routine follow-up after ***.   Guideline-Directed Medical Therapy Evaluation  ACEi/ARB/ARNi Target Achieved [] Yes [x] No Up-titration candidate today [] Yes [x] No Safety Concerns []  Hypotension []  Hyperkalemia Other ***  Beta-Blocker  Target Achieved [] Yes [x] No Up-titration candidate today [] Yes [] No Safety Concerns []  Bradycardia []  Hypotension []  Asthma/COPD Other ***  MRA Patient is not on therapy because of previous hypotension  SGLT-2 Patient is not on therapy because they are not a good candidate due to chronic kidney stones  Loop  Increase dose today  [] Yes [] No Safety Concerns []  Hypotension []  Hypokalemia []  Increased Scr Other ***  Heart Failure Symptoms & Volume Status   Dyspnea on exertion [] Yes [] No  Fatigue [] Yes [] No  Lower extremity edema [] Yes [] No  Weight gain (> 5 lbs) [] Yes [] No  Cough or wheezing  [] Yes [] No  Abdominal bloating/Discomfort [] Yes [] No  Early satiety or poor appetite [] Yes [] No  Dizziness of lightheadedness  [] Yes [] No  # Pillows used at night:   Adherence Assessment  Do you ever forget to take your medication? [] Yes [] No  Do you ever skip doses due to side effects? []   Yes [] No  Do you have trouble affording your medicines? [] Yes [] No  Are you ever unable to pick up your medication due to transportation difficulties? [] Yes [] No  Do you ever stop taking your medications because you don't believe they are helping? [] Yes [] No   Adherence strategy: *** Barriers to obtaining medications: *** Did you take your medications before your appointment today: [] Yes [] No  Preventative Care   Parameter Most Recent Result Goal/Status Current  Medications  LDL *** < 70 - 55 mg/dL *** Statin: *** Zetia: *** PCSK9i: ***  A1c ***  7 - 8 % *** Metformin: *** Other: ***  BP Control *** < 130/80 *** Additional: ***  ECHO *** 6 - 12 months NA   PLAN  Medication Interventions: *** Preventative Care Follow-up: *** Lab or imaging orders: ***  Time spent: 15 minutes  Thank you for allowing pharmacy to participate in this patient's care.   Jameal Razzano K Jakhia Buxton, Pharm.D. Pharmacy Resident 04/01/2024 8:12 AM

## 2024-04-01 NOTE — Telephone Encounter (Signed)
 Patient did not show for his Heart Failure Clinic appointment on 04/01/24.

## 2024-04-02 ENCOUNTER — Other Ambulatory Visit (HOSPITAL_COMMUNITY): Payer: Self-pay

## 2024-04-03 ENCOUNTER — Other Ambulatory Visit (HOSPITAL_COMMUNITY): Payer: Self-pay

## 2024-04-08 ENCOUNTER — Ambulatory Visit: Payer: Self-pay | Admitting: Family

## 2024-04-08 DIAGNOSIS — I5022 Chronic systolic (congestive) heart failure: Secondary | ICD-10-CM

## 2024-04-08 NOTE — Telephone Encounter (Signed)
 Called pt to relay info (X2) and schedule an appointment. Phone range 1 time and went to a voicemail that isnt set up. Referral to EP sent in.

## 2024-04-08 NOTE — Telephone Encounter (Signed)
-----   Message from Charlette Console sent at 04/08/2024  2:11 PM EDT ----- Zio monitor showed frequent episodes of SVT. Please refer to EP.   Also needs a rescheduled appointment with us .

## 2024-04-11 DIAGNOSIS — E785 Hyperlipidemia, unspecified: Secondary | ICD-10-CM

## 2024-04-11 DIAGNOSIS — Z7901 Long term (current) use of anticoagulants: Secondary | ICD-10-CM

## 2024-04-11 DIAGNOSIS — I130 Hypertensive heart and chronic kidney disease with heart failure and stage 1 through stage 4 chronic kidney disease, or unspecified chronic kidney disease: Secondary | ICD-10-CM | POA: Diagnosis not present

## 2024-04-11 DIAGNOSIS — Z87442 Personal history of urinary calculi: Secondary | ICD-10-CM

## 2024-04-11 DIAGNOSIS — Z9181 History of falling: Secondary | ICD-10-CM

## 2024-04-11 DIAGNOSIS — R339 Retention of urine, unspecified: Secondary | ICD-10-CM

## 2024-04-11 DIAGNOSIS — I5042 Chronic combined systolic (congestive) and diastolic (congestive) heart failure: Secondary | ICD-10-CM | POA: Diagnosis not present

## 2024-04-11 DIAGNOSIS — I2694 Multiple subsegmental pulmonary emboli without acute cor pulmonale: Secondary | ICD-10-CM

## 2024-04-11 DIAGNOSIS — E1122 Type 2 diabetes mellitus with diabetic chronic kidney disease: Secondary | ICD-10-CM | POA: Diagnosis not present

## 2024-04-11 DIAGNOSIS — N1831 Chronic kidney disease, stage 3a: Secondary | ICD-10-CM | POA: Diagnosis not present

## 2024-04-11 DIAGNOSIS — Z79891 Long term (current) use of opiate analgesic: Secondary | ICD-10-CM

## 2024-04-11 DIAGNOSIS — I25110 Atherosclerotic heart disease of native coronary artery with unstable angina pectoris: Secondary | ICD-10-CM

## 2024-04-25 ENCOUNTER — Other Ambulatory Visit: Payer: Self-pay

## 2024-05-07 ENCOUNTER — Ambulatory Visit: Admitting: Internal Medicine

## 2024-05-10 ENCOUNTER — Emergency Department

## 2024-05-10 ENCOUNTER — Observation Stay
Admission: EM | Admit: 2024-05-10 | Discharge: 2024-05-14 | Disposition: A | Discharge status: Home / Self Care | Attending: Internal Medicine | Admitting: Internal Medicine

## 2024-05-10 ENCOUNTER — Other Ambulatory Visit: Payer: Self-pay

## 2024-05-10 DIAGNOSIS — F1290 Cannabis use, unspecified, uncomplicated: Secondary | ICD-10-CM | POA: Insufficient documentation

## 2024-05-10 DIAGNOSIS — I5A Non-ischemic myocardial injury (non-traumatic): Secondary | ICD-10-CM | POA: Diagnosis not present

## 2024-05-10 DIAGNOSIS — N200 Calculus of kidney: Secondary | ICD-10-CM | POA: Diagnosis not present

## 2024-05-10 DIAGNOSIS — Z6830 Body mass index (BMI) 30.0-30.9, adult: Secondary | ICD-10-CM | POA: Insufficient documentation

## 2024-05-10 DIAGNOSIS — I5023 Acute on chronic systolic (congestive) heart failure: Secondary | ICD-10-CM | POA: Diagnosis not present

## 2024-05-10 DIAGNOSIS — I25118 Atherosclerotic heart disease of native coronary artery with other forms of angina pectoris: Secondary | ICD-10-CM

## 2024-05-10 DIAGNOSIS — I200 Unstable angina: Secondary | ICD-10-CM | POA: Diagnosis not present

## 2024-05-10 DIAGNOSIS — R0602 Shortness of breath: Secondary | ICD-10-CM | POA: Diagnosis not present

## 2024-05-10 DIAGNOSIS — Z7901 Long term (current) use of anticoagulants: Secondary | ICD-10-CM | POA: Diagnosis not present

## 2024-05-10 DIAGNOSIS — N1831 Chronic kidney disease, stage 3a: Secondary | ICD-10-CM | POA: Diagnosis not present

## 2024-05-10 DIAGNOSIS — I130 Hypertensive heart and chronic kidney disease with heart failure and stage 1 through stage 4 chronic kidney disease, or unspecified chronic kidney disease: Secondary | ICD-10-CM | POA: Diagnosis not present

## 2024-05-10 DIAGNOSIS — Z87891 Personal history of nicotine dependence: Secondary | ICD-10-CM | POA: Diagnosis not present

## 2024-05-10 DIAGNOSIS — E876 Hypokalemia: Secondary | ICD-10-CM | POA: Insufficient documentation

## 2024-05-10 DIAGNOSIS — E875 Hyperkalemia: Secondary | ICD-10-CM | POA: Diagnosis not present

## 2024-05-10 DIAGNOSIS — I10 Essential (primary) hypertension: Secondary | ICD-10-CM | POA: Diagnosis not present

## 2024-05-10 DIAGNOSIS — E785 Hyperlipidemia, unspecified: Secondary | ICD-10-CM | POA: Diagnosis present

## 2024-05-10 DIAGNOSIS — Z1152 Encounter for screening for COVID-19: Secondary | ICD-10-CM | POA: Diagnosis not present

## 2024-05-10 DIAGNOSIS — E669 Obesity, unspecified: Secondary | ICD-10-CM | POA: Diagnosis not present

## 2024-05-10 DIAGNOSIS — I2510 Atherosclerotic heart disease of native coronary artery without angina pectoris: Secondary | ICD-10-CM | POA: Diagnosis not present

## 2024-05-10 DIAGNOSIS — D649 Anemia, unspecified: Secondary | ICD-10-CM | POA: Diagnosis present

## 2024-05-10 DIAGNOSIS — Z86711 Personal history of pulmonary embolism: Secondary | ICD-10-CM | POA: Diagnosis not present

## 2024-05-10 DIAGNOSIS — R262 Difficulty in walking, not elsewhere classified: Secondary | ICD-10-CM | POA: Insufficient documentation

## 2024-05-10 DIAGNOSIS — I259 Chronic ischemic heart disease, unspecified: Principal | ICD-10-CM | POA: Diagnosis present

## 2024-05-10 DIAGNOSIS — Z79899 Other long term (current) drug therapy: Secondary | ICD-10-CM | POA: Insufficient documentation

## 2024-05-10 DIAGNOSIS — R079 Chest pain, unspecified: Secondary | ICD-10-CM | POA: Diagnosis present

## 2024-05-10 DIAGNOSIS — I5043 Acute on chronic combined systolic (congestive) and diastolic (congestive) heart failure: Secondary | ICD-10-CM | POA: Diagnosis not present

## 2024-05-10 LAB — COMPREHENSIVE METABOLIC PANEL WITH GFR
ALT: 16 U/L (ref 0–44)
AST: 27 U/L (ref 15–41)
Albumin: 4 g/dL (ref 3.5–5.0)
Alkaline Phosphatase: 85 U/L (ref 38–126)
Anion gap: 9 (ref 5–15)
BUN: 16 mg/dL (ref 8–23)
CO2: 23 mmol/L (ref 22–32)
Calcium: 8.7 mg/dL — ABNORMAL LOW (ref 8.9–10.3)
Chloride: 105 mmol/L (ref 98–111)
Creatinine, Ser: 0.94 mg/dL (ref 0.61–1.24)
GFR, Estimated: >60 mL/min (ref >60)
Glucose, Bld: 142 mg/dL — ABNORMAL HIGH (ref 70–99)
Potassium: 3.4 mmol/L — ABNORMAL LOW (ref 3.5–5.1)
Sodium: 137 mmol/L (ref 135–145)
Total Bilirubin: 1.1 mg/dL (ref 0.0–1.2)
Total Protein: 7.3 g/dL (ref 6.5–8.1)

## 2024-05-10 LAB — URINE DRUG SCREEN, QUALITATIVE (ARMC ONLY)
Amphetamines, Ur Screen: NOT DETECTED
Barbiturates, Ur Screen: NOT DETECTED
Benzodiazepine, Ur Scrn: NOT DETECTED
Cannabinoid 50 Ng, Ur ~~LOC~~: POSITIVE — AB
Cocaine Metabolite,Ur ~~LOC~~: NOT DETECTED
MDMA (Ecstasy)Ur Screen: NOT DETECTED
Methadone Scn, Ur: NOT DETECTED
Opiate, Ur Screen: NOT DETECTED
Phencyclidine (PCP) Ur S: NOT DETECTED
Tricyclic, Ur Screen: NOT DETECTED

## 2024-05-10 LAB — URINALYSIS, W/ REFLEX TO CULTURE (INFECTION SUSPECTED)
Bilirubin Urine: NEGATIVE
Glucose, UA: NEGATIVE mg/dL
Ketones, ur: NEGATIVE mg/dL
Nitrite: NEGATIVE
Protein, ur: NEGATIVE mg/dL
Specific Gravity, Urine: 1.004 — ABNORMAL LOW (ref 1.005–1.030)
Squamous Epithelial / HPF: 0 /HPF (ref 0–5)
pH: 6 (ref 5.0–8.0)

## 2024-05-10 LAB — CBC
HCT: 27.5 % — ABNORMAL LOW (ref 39.0–52.0)
Hemoglobin: 8.7 g/dL — ABNORMAL LOW (ref 13.0–17.0)
MCH: 28.7 pg (ref 26.0–34.0)
MCHC: 31.6 g/dL (ref 30.0–36.0)
MCV: 90.8 fL (ref 80.0–100.0)
Platelets: 168 10*3/uL (ref 150–400)
RBC: 3.03 MIL/uL — ABNORMAL LOW (ref 4.22–5.81)
RDW: 14.9 % (ref 11.5–15.5)
WBC: 4.2 10*3/uL (ref 4.0–10.5)
nRBC: 0 % (ref 0.0–0.2)

## 2024-05-10 LAB — RESP PANEL BY RT-PCR (RSV, FLU A&B, COVID)  RVPGX2
Influenza A by PCR: NEGATIVE
Influenza B by PCR: NEGATIVE
Resp Syncytial Virus by PCR: NEGATIVE
SARS Coronavirus 2 by RT PCR: NEGATIVE

## 2024-05-10 LAB — TROPONIN I (HIGH SENSITIVITY)
Troponin I (High Sensitivity): 44 ng/L — ABNORMAL HIGH (ref <18)
Troponin I (High Sensitivity): 41 ng/L — ABNORMAL HIGH (ref <18)

## 2024-05-10 LAB — APTT
aPTT: 38 s — ABNORMAL HIGH (ref 24–36)
aPTT: 90 s — ABNORMAL HIGH (ref 24–36)

## 2024-05-10 LAB — BRAIN NATRIURETIC PEPTIDE: B Natriuretic Peptide: 2316.8 pg/mL — ABNORMAL HIGH (ref 0.0–100.0)

## 2024-05-10 LAB — PROTIME-INR
INR: 1.3 — ABNORMAL HIGH (ref 0.8–1.2)
Prothrombin Time: 16.7 s — ABNORMAL HIGH (ref 11.4–15.2)

## 2024-05-10 LAB — HEPARIN LEVEL (UNFRACTIONATED): Heparin Unfractionated: >1.1 [IU]/mL — ABNORMAL HIGH (ref 0.30–0.70)

## 2024-05-10 MED ORDER — POTASSIUM CHLORIDE CRYS ER 20 MEQ PO TBCR
40.0000 meq | EXTENDED_RELEASE_TABLET | Freq: Two times a day (BID) | ORAL | Status: DC
  Administered 2024-05-10 – 2024-05-13 (×6): 40 meq via ORAL
  Filled 2024-05-10 (×6): qty 2

## 2024-05-10 MED ORDER — METOPROLOL SUCCINATE ER 25 MG PO TB24
25.0000 mg | ORAL_TABLET | Freq: Every day | ORAL | Status: DC
  Administered 2024-05-10 – 2024-05-14 (×5): 25 mg via ORAL
  Filled 2024-05-10 (×5): qty 1

## 2024-05-10 MED ORDER — SACUBITRIL-VALSARTAN 24-26 MG PO TABS
1.0000 | ORAL_TABLET | Freq: Two times a day (BID) | ORAL | Status: DC
  Administered 2024-05-10 – 2024-05-14 (×4): 1 via ORAL
  Filled 2024-05-10 (×7): qty 1

## 2024-05-10 MED ORDER — NITROGLYCERIN 0.4 MG SL SUBL: 0.4000 mg | SUBLINGUAL_TABLET | SUBLINGUAL | Status: DC | PRN

## 2024-05-10 MED ORDER — ACETAMINOPHEN 650 MG RE SUPP: 650.0000 mg | Freq: Four times a day (QID) | RECTAL | Status: DC | PRN

## 2024-05-10 MED ORDER — FUROSEMIDE 10 MG/ML IJ SOLN
40.0000 mg | Freq: Once | INTRAMUSCULAR | Status: AC
  Administered 2024-05-10: 40 mg via INTRAVENOUS
  Filled 2024-05-10: qty 4

## 2024-05-10 MED ORDER — POTASSIUM CHLORIDE CRYS ER 20 MEQ PO TBCR
40.0000 meq | EXTENDED_RELEASE_TABLET | Freq: Once | ORAL | Status: AC
Start: 2024-05-10 — End: 2024-05-10
  Administered 2024-05-10: 40 meq via ORAL
  Filled 2024-05-10: qty 2

## 2024-05-10 MED ORDER — ALBUTEROL SULFATE (2.5 MG/3ML) 0.083% IN NEBU
2.5000 mg | INHALATION_SOLUTION | Freq: Four times a day (QID) | RESPIRATORY_TRACT | Status: DC | PRN
  Administered 2024-05-11: 2.5 mg via RESPIRATORY_TRACT
  Filled 2024-05-10: qty 3

## 2024-05-10 MED ORDER — ATORVASTATIN CALCIUM 20 MG PO TABS
40.0000 mg | ORAL_TABLET | Freq: Every day | ORAL | Status: DC
  Administered 2024-05-11 – 2024-05-14 (×4): 40 mg via ORAL
  Filled 2024-05-10 (×4): qty 2

## 2024-05-10 MED ORDER — FESOTERODINE FUMARATE ER 4 MG PO TB24
4.0000 mg | ORAL_TABLET | Freq: Every day | ORAL | Status: DC
  Administered 2024-05-11 – 2024-05-14 (×4): 4 mg via ORAL
  Filled 2024-05-10 (×4): qty 1

## 2024-05-10 MED ORDER — HEPARIN (PORCINE) 25000 UT/250ML-% IV SOLN
1250.0000 [IU]/h | INTRAVENOUS | Status: DC
  Administered 2024-05-10: 1250 [IU]/h via INTRAVENOUS
  Filled 2024-05-10: qty 250

## 2024-05-10 MED ORDER — ACETAMINOPHEN 325 MG PO TABS
650.0000 mg | ORAL_TABLET | Freq: Four times a day (QID) | ORAL | Status: DC | PRN
  Administered 2024-05-11 – 2024-05-14 (×9): 650 mg via ORAL
  Filled 2024-05-10 (×9): qty 2

## 2024-05-10 MED ORDER — TAMSULOSIN HCL 0.4 MG PO CAPS
0.4000 mg | ORAL_CAPSULE | Freq: Every day | ORAL | Status: DC
  Administered 2024-05-11 – 2024-05-13 (×3): 0.4 mg via ORAL
  Filled 2024-05-10 (×3): qty 1

## 2024-05-10 MED ORDER — ATORVASTATIN CALCIUM 20 MG PO TABS: 40.0000 mg | ORAL_TABLET | Freq: Every day | ORAL | Status: DC

## 2024-05-10 MED ORDER — TRAZODONE HCL 50 MG PO TABS
50.0000 mg | ORAL_TABLET | Freq: Every evening | ORAL | Status: DC | PRN
  Administered 2024-05-10: 50 mg via ORAL
  Filled 2024-05-10: qty 1

## 2024-05-10 MED ORDER — NITROGLYCERIN IN D5W 200-5 MCG/ML-% IV SOLN
0.0000 ug/min | INTRAVENOUS | Status: DC
  Administered 2024-05-10: 5 ug/min via INTRAVENOUS
  Filled 2024-05-10: qty 250

## 2024-05-10 MED ORDER — ASPIRIN 81 MG PO CHEW
324.0000 mg | CHEWABLE_TABLET | Freq: Once | ORAL | Status: AC
  Administered 2024-05-10: 324 mg via ORAL
  Filled 2024-05-10: qty 4

## 2024-05-10 MED ORDER — FUROSEMIDE 10 MG/ML IJ SOLN
40.0000 mg | Freq: Two times a day (BID) | INTRAMUSCULAR | Status: AC
Start: 2024-05-11 — End: ?
  Administered 2024-05-11 (×2): 40 mg via INTRAVENOUS
  Filled 2024-05-10 (×2): qty 4

## 2024-05-10 MED ORDER — NITROGLYCERIN 0.4 MG SL SUBL
0.4000 mg | SUBLINGUAL_TABLET | Freq: Once | SUBLINGUAL | Status: AC
  Administered 2024-05-10: 0.4 mg via SUBLINGUAL
  Filled 2024-05-10: qty 1

## 2024-05-10 MED ORDER — ASPIRIN 81 MG PO CHEW
81.0000 mg | CHEWABLE_TABLET | Freq: Every day | ORAL | Status: DC
  Administered 2024-05-11 – 2024-05-14 (×4): 81 mg via ORAL
  Filled 2024-05-10 (×4): qty 1

## 2024-05-10 NOTE — Assessment & Plan Note (Addendum)
 See above

## 2024-05-10 NOTE — Assessment & Plan Note (Signed)
 Baseline Hgb 8-10 over last year.  Stable within range - Check iron indices

## 2024-05-10 NOTE — Assessment & Plan Note (Signed)
 Presented with SOB on exertion, orthopnea, ankle swelling, edema on CXR with pleural effusions.    Echo last Nov showed EF 25-30% -Furosemide  40 mg IV twice a day  -K supplement -Strict I/Os, daily weights, telemetry  -Daily monitoring renal function -Consult Cardiology, appreciate cares

## 2024-05-10 NOTE — Assessment & Plan Note (Addendum)
 UA shows few RBCs, no WBCs, no significant bacteria.  Doubt infection, c/w known stones.

## 2024-05-10 NOTE — Assessment & Plan Note (Signed)
 Patient has lowly elevated troponin, suspect this is myocardial injury from CHF. - Consult cardiology - Heparin , aspirin , nitro per cardiology

## 2024-05-10 NOTE — Hospital Course (Addendum)
 68 y.o. M with sCHF EF 25%, HTN, CAD s/p PCI 04/2023, CKD IIIa baseline 1.2-1.6, pneumothorax last spring followed by PE in Mar 2025 on Eliquis , obesity and hx kidney stones presented with chest pain.  Pain started few days ago, nonexertional, worse with lying flat also abdominal bloating. Had a sensation that he was gasping with laying flat, better with standing and walking around. Couldn't sleep at all last night, so he came to the ER.  Having a little dysuria, bloating, and chills, no fever, no urinary urgency/frequency.  In the ER, CXR showed edema.  ECG showed no ST changes. Troponin 40s, so he was started on heparin  gtt and IV Lasix  and hospitalist service was asked to evaluate for CHF and possible ACS.

## 2024-05-10 NOTE — H&P (Signed)
 History and Physical    Patient: Ronnie Mann XBJ:[id number] DOB: September 15, 1956 DOA: 05/10/2024 DOS: the patient was seen and examined on 05/10/2024 PCP: [name], DO  Patient coming from: Home  Chief Complaint:  Chief Complaint  Patient presents with   Chest Pain   Shortness of Breath   Dizziness       HPI:  68 y.o. M with sCHF EF 25%, HTN, CAD s/p PCI 04/2023, CKD IIIa baseline 1.2-1.6, pneumothorax last spring followed by PE in Mar 2025 on Eliquis , obesity and hx kidney stones presented with chest pain.  Pain started few days ago, nonexertional, worse with lying flat also abdominal bloating. Had a sensation that he was gasping with laying flat, better with standing and walking around. Couldn't sleep at all last night, so he came to the ER.  Having a little dysuria, bloating, and chills, no fever, no urinary urgency/frequency.  In the ER, CXR showed edema.  ECG showed no ST changes. Troponin 40s, so he was started on heparin  gtt and IV Lasix  and hospitalist service was asked to evaluate for CHF and possible ACS.      Review of Systems  Constitutional:  Positive for chills. Negative for fever.  Respiratory:  Positive for shortness of breath. Negative for cough, hemoptysis, sputum production and wheezing.   Cardiovascular:  Positive for chest pain, orthopnea, leg swelling and PND. Negative for palpitations and claudication.  All other systems reviewed and are negative.    Past Medical History:  Diagnosis Date   CHF (congestive heart failure) (HCC)    Coronary artery disease 05/01/2023   PCI/DES   HFrEF (heart failure with reduced ejection fraction) (HCC) 05/01/2023   Hiatal hernia    HLD (hyperlipidemia)    HTN (hypertension)    Ischemic cardiomyopathy 05/01/2023   LVEF 30-35%   Kidney stones    Pneumothorax    Past Surgical History:  Procedure Laterality Date   CORONARY STENT INTERVENTION N/A 05/01/2023   Procedure: CORONARY STENT INTERVENTION;  Surgeon:  Wenona Hamilton, MD;  Location: [facility];  Service: Cardiovascular;  Laterality: N/A;   CYSTOSCOPY W/ URETERAL STENT PLACEMENT Right 10/12/2023   Procedure: CYSTOSCOPY WITH STENT REPLACEMENT;  Surgeon: Geraline Knapp, MD;  Location: [facility];  Service: Urology;  Laterality: Right;   LEFT HEART CATH AND CORONARY ANGIOGRAPHY N/A 05/01/2023   Procedure: LEFT HEART CATH AND CORONARY ANGIOGRAPHY;  Surgeon: Wenona Hamilton, MD;  Location: [facility];  Service: Cardiovascular;  Laterality: N/A;   Social History:  reports that he has quit smoking. His smoking use included cigarettes. He has never been exposed to tobacco smoke. He has never used smokeless tobacco. He reports that he does not currently use alcohol. He reports current drug use. Drug: Marijuana.  Allergies  Allergen Reactions   Losartan  Other (See Comments)    Chest pain    Family History  Problem Relation Age of Onset   Heart disease Father     Prior to Admission medications   Medication Sig Start Date End Date Taking? Authorizing Provider  apixaban  (ELIQUIS ) 5 MG TABS tablet Take 2 tablets (10 mg total) by mouth 2 (two) times daily for 7 days, THEN 1 tablet (5 mg total) 2 (two) times daily. 02/01/24 05/24/24 Yes Wouk, Haynes Lips, MD  atorvastatin  (LIPITOR) 40 MG tablet Take 1 tablet (40 mg total) by mouth daily. 01/08/24  Yes Hackney, Brian Campanile A, FNP  ENTRESTO  24-26 MG Take 1 tablet by mouth 2 (two) times  daily. 04/05/24  Yes [provider]  furosemide  (LASIX ) 20 MG tablet Take 2 tablets (40 mg total) by mouth daily. Patient taking differently: Take 20 mg by mouth daily. 02/09/24  Yes Shawnee Dellen A, FNP  metoprolol  succinate (TOPROL -XL) 25 MG 24 hr tablet Take 1 tablet (25 mg total) by mouth daily. 01/08/24  Yes Hackney, Brian Campanile A, FNP  nitroGLYCERIN  (NITROSTAT ) 0.4 MG SL tablet Place 1 tablet (0.4 mg total) under the tongue every 5 (five) minutes as needed for chest pain. 03/12/24 03/12/25 Yes Hackney, Brian Campanile  A, FNP  ondansetron  (ZOFRAN -ODT) 4 MG disintegrating tablet Take 1 tablet (4 mg total) by mouth every 8 (eight) hours as needed. 02/22/24  Yes [name], DO  solifenacin (VESICARE) 10 MG tablet Take 10 mg by mouth daily.   Yes [provider]  tamsulosin  (FLOMAX ) 0.4 MG CAPS capsule Take 1 capsule (0.4 mg total) by mouth daily after supper. 11/06/23  Yes Garrison Kanner, MD  albuterol  (VENTOLIN  HFA) 108 [phone] Base) MCG/ACT inhaler Inhale 2 puffs into the lungs every 6 (six) hours as needed for wheezing or shortness of breath. 10/14/23   Verla Glaze, MD  traZODone  (DESYREL ) 50 MG tablet Take 1 tablet (50 mg total) by mouth at bedtime as needed for sleep. 02/22/24   [name], DO  valsartan  (DIOVAN ) 40 MG tablet Take 0.5 tablets (20 mg total) by mouth daily. 02/16/24   Charlette Console, FNP    Physical Exam: Vitals:   05/10/24 1700 05/10/24 1730 05/10/24 1830 05/10/24 1900  BP: 134/84 134/87 139/65 136/84  Pulse: 74 79 66 76  Resp: 19 19 12 19   Temp:      TempSrc:      SpO2: 93% 95% 97% 100%  Weight:      Height:       Adult male, obese, sitting up in bed, appears uncomfortable and restless, short of breath with talking Edentulous, oropharynx moist, no oral lesions Heart rate normal, soft systolic murmur, JVP elevated, trace pitting in both lower extremities to the shins Respiratory rate normal, lung sounds diminished, but rales bilaterally at the bases, fine, no wheezing Abdomen soft, no tenderness or guarding to palpation, there is a chronic right sided old scar Attention normal, affect appropriate, judgment and insight appear normal, face symmetric, speech fluent Skin has innumerable small excoriated scabs   Data Reviewed: Basic metabolic panel shows mild hypokalemia, normal sodium, bicarb, creatinine, LFTs BNP greater than 2000 Troponin low and flat CBC shows normocytic anemia INR 1.3 COVID-negative Urinalysis shows few red blood cells, rare bacteria, no white  cells UDS positive for cannabinoids, no cocaine or amphetamines Chest x-ray, personally reviewed, shows bilateral infiltrates consistent with edema EKG, personally reviewed shows bifascicular block, normal ST segments    Assessment and Plan: * Acute on chronic combined systolic and diastolic CHF (congestive heart failure) (HCC) Presented with SOB on exertion, orthopnea, ankle swelling, edema on CXR with pleural effusions.    Echo last Nov showed EF 25-30% -Furosemide  40 mg IV twice a day  -K supplement -Strict I/Os, daily weights, telemetry  -Daily monitoring renal function -Consult Cardiology, appreciate cares     Hypokalemia - Supplement potassium  History of pulmonary embolus (PE) No longer on anticoagulation  CKD stage 3a, GFR 45-59 ml/min (HCC) Creatinine lower than recent baseline  Normocytic anemia Baseline Hgb 8-10 over last year.  Stable within range - Check iron indices  Obesity (BMI 30-39.9) BMI 30.2, complicates care, class I obesity  CAD (coronary artery disease)  See above    HLD (hyperlipidemia) - Continue home Lipitor  Myocardial injury Patient has lowly elevated troponin, suspect this is myocardial injury from CHF. - Consult cardiology - Heparin , aspirin , nitro per cardiology   Essential hypertension BP normal - Continue Entresto , Toprol  - Valsartan  on home med list, this is held  Recurrent nephrolithiasis UA shows few RBCs, no WBCs, no significant bacteria.  Doubt infection, c/w known stones.         Advance Care Planning: Full code  Consults: Cardiology, Dr. Nolan Battle  Family Communication: None present  Severity of Illness: The appropriate patient status for this patient is OBSERVATION. Observation status is judged to be reasonable and necessary in order to provide the required intensity of service to ensure the patient's safety. The patient's presenting symptoms, physical exam findings, and initial radiographic and laboratory data  in the context of their medical condition is felt to place them at decreased risk for further clinical deterioration. Furthermore, it is anticipated that the patient will be medically stable for discharge from the hospital within 2 midnights of admission.   Author: Ephriam Hashimoto, MD 05/10/2024 8:10 PM  For on call review [email or web address].

## 2024-05-10 NOTE — Consult Note (Signed)
 PHARMACY - ANTICOAGULATION CONSULT NOTE  Pharmacy Consult for Heparin  Indication: chest pain/ACS  Allergies  Allergen Reactions   Losartan  Other (See Comments)    Chest pain    Patient Measurements: Height: 5' 9 (175.3 cm) Weight: 85.4 kg (188 lb 4.4 oz) IBW/kg (Calculated) : 70.7 HEPARIN  DW (KG): 89.8  Vital Signs: Temp: 98 F (36.7 C) (06/20 2020) Temp Source: Oral (06/20 2020) BP: 120/63 (06/20 2020) Pulse Rate: 77 (06/20 2020)  Labs: Recent Labs    05/10/24 1322 05/10/24 1518 05/10/24 1751 05/10/24 2151  HGB 8.7*  --   --   --   HCT 27.5*  --   --   --   PLT 168  --   --   --   APTT  --  38*  --  90*  LABPROT  --  16.7*  --   --   INR  --  1.3*  --   --   HEPARINUNFRC  --  >1.10*  --   --   CREATININE 0.94  --   --   --   TROPONINIHS 44*  --  41*  --     Estimated Creatinine Clearance: 81.5 mL/min (by C-G formula based on SCr of 0.94 mg/dL).   Medical History: Past Medical History:  Diagnosis Date   CHF (congestive heart failure) (HCC)    Coronary artery disease 05/01/2023   PCI/DES   HFrEF (heart failure with reduced ejection fraction) (HCC) 05/01/2023   Hiatal hernia    HLD (hyperlipidemia)    HTN (hypertension)    Ischemic cardiomyopathy 05/01/2023   LVEF 30-35%   Kidney stones    Pneumothorax     Medications:  Apixaban  5mg  twice daily, last dose: morning of 6/20  Assessment: 68 y.o. male with a hx of coronary artery disease with NSTEMI (04/2023) with PCI to the proximal LAD and RCA, PE(on apixaban ), HFrEF secondary to ICM, CKD stage IIIb, nephrolithiasis requiring multiple surgeries and lithotripsies, and hiatal hernia. Pharmacy has been consulted to initiate and dose continuous heparin  infusion.  Baseline labs: aPTT 38 sec, HL>1.1, INR 1.3, Hgb 8.7, Plts 168(b/l in the 200s)  Goal of Therapy:  Heparin  level 0.3-0.7 units/ml aPTT 66-102 seconds Monitor platelets by anticoagulation protocol: Yes   Plan:  6/20: aPTT @ 2151 = 90,  therapeutic X 1 - will continue pt on current rate and recheck aPTT and HL in 6 hrs - Will continue to dose via aPTT until both levels correlate, then will transition to HL dosing - Continue to monitor H&H and platelets  Jaceion Aday D, PharmD Clinical Pharmacist 05/10/2024 10:38 PM

## 2024-05-10 NOTE — Assessment & Plan Note (Signed)
 Creatinine lower than recent baseline

## 2024-05-10 NOTE — Consult Note (Addendum)
 PHARMACY - ANTICOAGULATION CONSULT NOTE  Pharmacy Consult for Heparin  Indication: chest pain/ACS  Allergies  Allergen Reactions   Losartan  Other (See Comments)    Chest pain    Patient Measurements: Height: 5' 9 (175.3 cm) Weight: 93 kg (205 lb) IBW/kg (Calculated) : 70.7 HEPARIN  DW (KG): 89.8  Vital Signs: Temp: 98.1 F (36.7 C) (06/20 1320) Temp Source: Oral (06/20 1320) BP: 124/72 (06/20 1400) Pulse Rate: 67 (06/20 1400)  Labs: Recent Labs    05/10/24 1322  HGB 8.7*  HCT 27.5*  PLT 168  CREATININE 0.94  TROPONINIHS 44*    Estimated Creatinine Clearance: 84.7 mL/min (by C-G formula based on SCr of 0.94 mg/dL).   Medical History: Past Medical History:  Diagnosis Date   CHF (congestive heart failure) (HCC)    Coronary artery disease 05/01/2023   PCI/DES   HFrEF (heart failure with reduced ejection fraction) (HCC) 05/01/2023   Hiatal hernia    HLD (hyperlipidemia)    HTN (hypertension)    Ischemic cardiomyopathy 05/01/2023   LVEF 30-35%   Kidney stones    Pneumothorax     Medications:  Apixaban  5mg  twice daily, last dose: morning of 6/20  Assessment: 68 y.o. male with a hx of coronary artery disease with NSTEMI (04/2023) with PCI to the proximal LAD and RCA, PE(on apixaban ), HFrEF secondary to ICM, CKD stage IIIb, nephrolithiasis requiring multiple surgeries and lithotripsies, and hiatal hernia. Pharmacy has been consulted to initiate and dose continuous heparin  infusion.  Baseline labs: aPTT 38 sec, HL>1.1, INR 1.3, Hgb 8.7, Plts 168(b/l in the 200s)  Goal of Therapy:  Heparin  level 0.3-0.7 units/ml aPTT 66-102 seconds Monitor platelets by anticoagulation protocol: Yes   Plan:  - Given recent apixaban  administration, will avoid initial bolus but okay to use nomogram thereafter - Start heparin  infusion at 1250 units/hr - Check aPTT level in 6 hours and HL daily while on heparin  - Will continue to dose via aPTT until both levels correlate, then  will transition to HL dosing - Continue to monitor H&H and platelets  Terriah Reggio A Thomasina Housley, PharmD Clinical Pharmacist 05/10/2024 4:10 PM

## 2024-05-10 NOTE — Assessment & Plan Note (Signed)
 Continue home Lipitor

## 2024-05-10 NOTE — ED Provider Notes (Addendum)
 [facility] Provider Note    Event Date/Time   First MD Initiated Contact with Patient 05/10/24 1312     (approximate)   History   Chest Pain, Shortness of Breath, and Dizziness   HPI  Ronnie Mann is a 68 y.o. male with a history of CAD, CHF, hypertension who presents with complaints of chest pain.  Patient reports he has had mild chest pressure the last couple of days but it worsened overnight, it kept him from being able to sleep and he did have palpitations as well.  He feels some tightness in his lungs/chest.  No fevers or cough.  No lower extremity swelling.     Physical Exam   Triage Vital Signs: ED Triage Vitals  Encounter Vitals Group     BP 05/10/24 1320 123/66     Girls Systolic BP Percentile --      Girls Diastolic BP Percentile --      Boys Systolic BP Percentile --      Boys Diastolic BP Percentile --      Pulse Rate 05/10/24 1320 68     Resp 05/10/24 1320 14     Temp 05/10/24 1320 98.1 F (36.7 C)     Temp Source 05/10/24 1320 Oral     SpO2 05/10/24 1320 100 %     Weight 05/10/24 1320 93 kg (205 lb)     Height 05/10/24 1320 1.753 m (5' 9)     Head Circumference --      Peak Flow --      Pain Score 05/10/24 1317 8     Pain Loc --      Pain Education --      Exclude from Growth Chart --     Most recent vital signs: Vitals:   05/10/24 1330 05/10/24 1400  BP: 123/77 124/72  Pulse: 72 67  Resp: 19 18  Temp:    SpO2: 97% 100%     General: Awake,  CV:  Good peripheral perfusion.  Regular rate and rhythm Resp:  Normal effort.  Clear to auscultation bilaterally Abd:  No distention.  Soft, nontender Other:  No significant edema in the lower extremities   ED Results / Procedures / Treatments   Labs (all labs ordered are listed, but only abnormal results are displayed) Labs Reviewed  CBC - Abnormal; Notable for the following components:      Result Value   RBC 3.03 (*)    Hemoglobin 8.7 (*)    HCT 27.5 (*)    All  other components within normal limits  COMPREHENSIVE METABOLIC PANEL WITH GFR - Abnormal; Notable for the following components:   Potassium 3.4 (*)    Glucose, Bld 142 (*)    Calcium  8.7 (*)    All other components within normal limits  TROPONIN I (HIGH SENSITIVITY) - Abnormal; Notable for the following components:   Troponin I (High Sensitivity) 44 (*)    All other components within normal limits     EKG  ED ECG REPORT I, Bryson Carbine, the attending physician, personally viewed and interpreted this ECG.  Date: 05/10/2024  Rhythm: normal sinus rhythm QRS Axis: normal Intervals: normal ST/T Wave abnormalities: normal Narrative Interpretation: No significant change    RADIOLOGY Chest x-ray viewed interpret by me suspicious for mild edema    PROCEDURES:  Critical Care performed: yes  CRITICAL CARE Performed by: Bryson Carbine   Total critical care time: 30 minutes  Critical care time was exclusive  of separately billable procedures and treating other patients.  Critical care was necessary to treat or prevent imminent or life-threatening deterioration.  Critical care was time spent personally by me on the following activities: development of treatment plan with patient and/or surrogate as well as nursing, discussions with consultants, evaluation of patient's response to treatment, examination of patient, obtaining history from patient or surrogate, ordering and performing treatments and interventions, ordering and review of laboratory studies, ordering and review of radiographic studies, pulse oximetry and re-evaluation of patient's condition.   Procedures   MEDICATIONS ORDERED IN ED: Medications  furosemide  (LASIX ) injection 40 mg (has no administration in time range)  nitroGLYCERIN  (NITROSTAT ) SL tablet 0.4 mg (0.4 mg Sublingual Given 05/10/24 1400)     IMPRESSION / MDM / ASSESSMENT AND PLAN / ED COURSE  I reviewed the triage vital signs and the nursing  notes. Patient's presentation is most consistent with acute presentation with potential threat to life or bodily function.  Patient with significant history of CAD, review of catheterization from last year May 2024 demonstrates significant LAD disease which was unable to be stented.  He also follows with advanced heart disease clinic.  He is at high risk for ACS, pulmonary edema/CHF exacerbation  EKG does not appear significantly changed from prior, pending labs  Troponin is elevated at 44, I consulted with Dr. Nolan Battle of cardiology, he will see the patient in the emergency department  Chest x-ray appears consistent with CHF exacerbation, have ordered IV Lasix  as well, still pending radiology read  Have consulted the hospitalist for admission  Dr. Nolan Battle has recommended heparin  GTT drug ordered      FINAL CLINICAL IMPRESSION(S) / ED DIAGNOSES   Final diagnoses:  Chest pain due to myocardial ischemia, unspecified ischemic chest pain type     Rx / DC Orders   ED Discharge Orders     None        Note:  This document was prepared using Dragon voice recognition software and may include unintentional dictation errors.   Bryson Carbine, MD 05/10/24 1453    Bryson Carbine, MD 05/10/24 1501

## 2024-05-10 NOTE — Assessment & Plan Note (Signed)
-   Supplement potassium

## 2024-05-10 NOTE — Assessment & Plan Note (Signed)
 No longer on anticoagulation

## 2024-05-10 NOTE — ED Triage Notes (Signed)
 Pt to ED via [facility] from home for c/o left side chest rated at 8/10 and described as pressure, SOB, and dizziness. Pt states symptoms began 2-3 days ago, but is worse today. Pt has hx MI, 2 stents. Given 324 ASA and nitroglycerin  spray.  Cbg 157

## 2024-05-10 NOTE — Assessment & Plan Note (Signed)
 BP normal - Continue Entresto , Toprol  - Valsartan  on home med list, this is held

## 2024-05-10 NOTE — ED Notes (Signed)
 Pt called out wanting something to drink and eat, this tech asked pt's RN to make sure that was ok before I gave pt drink and food.  Pt has no other needs at the moment.

## 2024-05-10 NOTE — Consult Note (Signed)
 Cardiology Consultation   Patient ID: Ronnie Mann MRN: [id number]; DOB: 1956-09-28  Admit date: 05/10/2024 Date of Consult: 05/10/2024  PCP:  [name], DO   [facility] Providers Cardiologist:  Belva Boyden, MD        Patient Profile: Ronnie Mann is a 68 y.o. male with a hx of coronary artery disease with NSTEMI (04/2023) with PCI to the proximal LAD and RCA, HFrEF secondary to ICM, CKD stage IIIb, nephrolithiasis requiring multiple surgeries and lithotripsies, and hiatal hernia, who is being seen 05/10/2024 for the evaluation of chest pain and elevated high-sensitivity troponin at the request of Dr Martina Sledge.  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  Echocardiogram showed EF 30-35%, global hypokinesis with severe hypokinesis of the anterior, anterior septal, and apical region, mildly dilated LV internal cavity size mild LVH, G2 DD, normal RV systolic function and size, normal RVSP, moderate mitral regurgitation, and estimated right atrial pressure of 8 mmHg.  Left heart catheterization in 04/2023 showed severe two-vessel CAD with subtotal occlusion of the proximal LAD, diffuse moderate mid LAD disease and severe distal LAD disease.  There was also noted 95% stenosis of the proximal RCA with faint right to left collaterals to the LAD.  The left circumflex had mild nonobstructive disease and OM 3 was noted to be large, reaching the apex.  LVEF estimated 25% with severely elevated LVEDP estimated at 34%.  He underwent successful PCI/DES to the proximal LAD and proximal RCA.  He was subsequently followed by the advanced heart failure service for assistance with medication optimization and diuresis.  He was readmitted later in 04/2019 for with shortness of breath and left  flank pain with associated hematuria.  At that time he was transition from Brilinta  to clopidogrel  and aspirin  was discontinued.  He was again readmitted on 09/2023 with recurrent nephrolithiasis and hydronephrosis as well as volume overload with demand ischemia.  Limited echo at that time showed a persistent cardiomyopathy with an LVEF of 25 to 30% with a low normal RV systolic function, normal RVSP, moderately dilated left atrium, moderate mitral regurgitation, and mild dilatation of the aortic root measuring 40 mm.  He has been intermittently followed by [facility] advanced heart failure clinic.  He has not followed up with general cardiology since his initial admission in 04/2023.  He was recently [facility] - 01/27/2024 with recurrent nephrolithiasis status post right percutaneous nephrolithotomy with urethral stent placement complicated by intraoperative pneumothorax requiring right-sided chest tube placement and necessitation of vasopressor support.  Intraoperatively complicated by alveolar filtrates with concern for right forearm necrosis as well as chest pain with equivocal EKG changes and elevated troponin peaked over 2000's.  He was evaluated by cardiology with no acute indication for cardiac cath.  He again presented to [facility] on 01/30/2024 with bandlike chest pain that was worse with deep inspiration and rotational movement.  He continued to note some right-sided chest discomfort at the site of his prior chest tube.  EKG revealed sinus rhythm with right bundle and LVH with nonspecific ST-T changes.  High-sensitivity troponin 91 with a delta troponin of 86.  BNP improved to 98 from prior value of 1051.  Remaining notable labs include hemoglobin 10.1, serum creatinine 1.64.  Chest x-ray with no active disease and a moderate size hernia.  VQ scan pursued which showed acute pulmonary embolism segmental in the left lower lobe and subsegmental in the right lower lobe.  This is likely the source of  his chest pain.  Patient was started on heparin  and monitor overnight with no bleeding/hematuria and hemoglobin is stable.  His case was discussed with urology she stated patient was to do self discontinuation of his stent which was planned for about a 1 week.  He was started on apixaban .  Given his recent urological procedure and hospitalization presumed to be the cause of his provoked PE at that time.  He presented back to the [facility] emergency department on 02/05/2024 with complaints of flank pain, chest pain, and hematuria.  He was noted to be severely hypotensive with a blood pressure of 75/66, pulse of 63, respirations 21, temperature 97.5.  Labs were pertinent for sodium 134, CO2 21, blood glucose 153, serum creatinine 1.61, hemoglobin 9.7, urine was turbid in color, BMP to 14.8, high-sensitivity troponin 61 and 56.  EKG revealed normal sinus rhythm with a rate of 68 with right bundle branch block and left anterior fascicular block with LVH with no evidence of acute ischemia.  Chest x-ray showed no focal consolidation or edema.  CT renal stone study previously seen of right upper pole and right pelvic stones no longer visualized.  Stable mild right hydronephrosis.  Stable left lower pole renal stones and 8 mm proximal left ureteral stone.  Stable mild left hydronephrosis.  Patient was treated with 1 L normal saline bolus 50 mcg of fentanyl  and 0.5 mg of Dilaudid .  Workup was overall reassuring.  Troponins were minimally elevated but trending downwards from his recent labs.  Patient appeared much more comfortable blood pressure remained stable since just after his initial arrival.  He was recommended to continue his apixaban  and was given strict ED return precautions.  He presented back to [facility] emergency department on 02/10/2024 for chest pain.  Initial vitals revealed blood pressure 118/60, pulse of 68, respirations of 22, Drab 97.8.  Pertinent labs revealed CO2 20, blood glucose 149, BUN 32, serum creatinine  1.51, hemoglobin of 9, high-sensitivity troponin 25 and 23.  EKG was nonacute.  CTA of the chest revealed no evidence of pulmonary emboli, stable atherosclerotic changes in the thoracic aorta, left renal calculus without obstructive change, cholelithiasis without complicating factors.  He was treated with pain medication and was discharged home.  He was again treated in the [facility] emergency room 02/14/2024 with flank pain.  Again was treated with pain medications and IV fluids.  CT showed mild area of uncomplicated colitis in the ascending colon.  Pain was well-controlled with Percocet.  He was discharged home on antibiotics of cefdinir  and Flagyl  as well as Percocets.  Was evaluated by the advanced heart failure clinic on/11/25.  Unfortunately patient states that he has missed his appointment the following month.   Patient presented to the [facility] emergency department today via EMS from home for left-sided chest pain rated 8 out of 10 is described as a pressure and heaviness or something sitting on his chest with associated symptoms of shortness of breath and dizziness.  He stated that his symptoms began approximately 2 to 3 days prior.  He also states that he has congested cough and dizziness, chills and overall malaise.  Is unsure if he has been compliant with his current medication regimen as he states that he has several refills waiting for him at the pharmacy but believes that he has missed doses of  his apixaban .  States that he has noted approximately 5 pound weight gain.  States that he has not missed any of his furosemide .  Initial vital signs showed blood pressure 123/66, pulse of 60, respirations 14, temperature of 98.1.  Pertinent labs reveal hemoglobin of 8.7, serum potassium 3.4, blood glucose 142, calcium  87, high-sensitivity troponin of 44.  Chest x-ray appeared consistent with CHF exacerbation.  Patient has been treated with sublingual nitro 0.4 mg and furosemide  40 mg IVP.  Cardiology consulted  to assist management of chest pain with elevated sensitivity troponin.  Past Medical History:  Diagnosis Date   CHF (congestive heart failure) (HCC)    Coronary artery disease 05/01/2023   PCI/DES   HFrEF (heart failure with reduced ejection fraction) (HCC) 05/01/2023   Hiatal hernia    HLD (hyperlipidemia)    HTN (hypertension)    Ischemic cardiomyopathy 05/01/2023   LVEF 30-35%   Kidney stones    Pneumothorax     Past Surgical History:  Procedure Laterality Date   CORONARY STENT INTERVENTION N/A 05/01/2023   Procedure: CORONARY STENT INTERVENTION;  Surgeon: Wenona Hamilton, MD;  Location: [facility] INVASIVE CV LAB;  Service: Cardiovascular;  Laterality: N/A;   CYSTOSCOPY W/ URETERAL STENT PLACEMENT Right 10/12/2023   Procedure: CYSTOSCOPY WITH STENT REPLACEMENT;  Surgeon: Geraline Knapp, MD;  Location: [facility] ORS;  Service: Urology;  Laterality: Right;   LEFT HEART CATH AND CORONARY ANGIOGRAPHY N/A 05/01/2023   Procedure: LEFT HEART CATH AND CORONARY ANGIOGRAPHY;  Surgeon: Wenona Hamilton, MD;  Location: [facility] INVASIVE CV LAB;  Service: Cardiovascular;  Laterality: N/A;     Home Medications:  Prior to Admission medications   Medication Sig Start Date End Date Taking? Authorizing Provider  albuterol  (VENTOLIN  HFA) 108 (90 Base) MCG/ACT inhaler Inhale 2 puffs into the lungs every 6 (six) hours as needed for wheezing or shortness of breath. 10/14/23   Verla Glaze, MD  apixaban  (ELIQUIS ) 5 MG TABS tablet Take 2 tablets (10 mg total) by mouth 2 (two) times daily for 7 days, THEN 1 tablet (5 mg total) 2 (two) times daily. 02/01/24 05/24/24  Wouk, Haynes Lips, MD  atorvastatin  (LIPITOR) 40 MG tablet Take 1 tablet (40 mg total) by mouth daily. 01/08/24   Charlette Console, FNP  furosemide  (LASIX ) 20 MG tablet Take 2 tablets (40 mg total) by mouth daily. Patient taking differently: Take 20 mg by mouth daily. 02/09/24   Charlette Console, FNP  metoprolol  succinate (TOPROL -XL) 25 MG 24 hr tablet  Take 1 tablet (25 mg total) by mouth daily. 01/08/24   Charlette Console, FNP  nitroGLYCERIN  (NITROSTAT ) 0.4 MG SL tablet Place 1 tablet (0.4 mg total) under the tongue every 5 (five) minutes as needed for chest pain. 03/12/24 03/12/25  Charlette Console, FNP  ondansetron  (ZOFRAN -ODT) 4 MG disintegrating tablet Take 1 tablet (4 mg total) by mouth every 8 (eight) hours as needed. 02/22/24   [name], DO  solifenacin (VESICARE) 10 MG tablet Take 10 mg by mouth daily.    [provider]  tamsulosin  (FLOMAX ) 0.4 MG CAPS capsule Take 1 capsule (0.4 mg total) by mouth daily after supper. 11/06/23   Garrison Kanner, MD  traZODone  (DESYREL ) 50 MG tablet Take 1 tablet (50 mg total) by mouth at bedtime as needed for sleep. 02/22/24   [name], DO  valsartan  (DIOVAN ) 40 MG tablet Take 0.5 tablets (20 mg total) by mouth daily. 02/16/24   Charlette Console, FNP    Scheduled  Meds:  Continuous Infusions:  heparin  1,250 Units/hr (05/10/24 1625)   PRN Meds:   Allergies:    Allergies  Allergen Reactions   Losartan  Other (See Comments)    Chest pain    Social History:   Social History   Socioeconomic History   Marital status: Divorced    Spouse name: Not on file   Number of children: Not on file   Years of education: Not on file   Highest education level: 10th grade  Occupational History   Not on file  Tobacco Use   Smoking status: Former    Types: Cigarettes    Passive exposure: Never   Smokeless tobacco: Never  Vaping Use   Vaping status: Never Used  Substance and Sexual Activity   Alcohol use: Not Currently    Comment: former moderate drinker   Drug use: Yes    Types: Marijuana   Sexual activity: Not on file  Other Topics Concern   Not on file  Social History Narrative   Not on file   Social Drivers of Health   Financial Resource Strain: Low Risk  (01/23/2024)   Received from [facility]   Overall Financial Resource Strain (CARDIA)    Difficulty of Paying Living  Expenses: Not hard at all  Food Insecurity: Food Insecurity Present (02/01/2024)   Hunger Vital Sign    Worried About Running Out of Food in the Last Year: Often true    Ran Out of Food in the Last Year: Often true  Transportation Needs: No Transportation Needs (01/31/2024)   PRAPARE - Administrator, Civil Service (Medical): No    Lack of Transportation (Non-Medical): No  Physical Activity: Not on file  Stress: Not on file  Social Connections: Socially Isolated (01/31/2024)   Social Connection and Isolation Panel    Frequency of Communication with Friends and Family: Once a week    Frequency of Social Gatherings with Friends and Family: Never    Attends Religious Services: Never    Database administrator or Organizations: No    Attends Banker Meetings: Never    Marital Status: Divorced  Catering manager Violence: Not At Risk (01/31/2024)   Humiliation, Afraid, Rape, and Kick questionnaire    Fear of Current or Ex-Partner: No    Emotionally Abused: No    Physically Abused: No    Sexually Abused: No    Family History:    Family History  Problem Relation Age of Onset   Heart disease Father      ROS:  Please see the history of present illness.  Review of Systems  Constitutional:  Positive for malaise/fatigue.  Respiratory:  Positive for shortness of breath.   Cardiovascular:  Positive for chest pain.  Neurological:  Positive for dizziness and weakness.    All other ROS reviewed and negative.     Physical Exam/Data: Vitals:   05/10/24 1320 05/10/24 1330 05/10/24 1400  BP: 123/66 123/77 124/72  Pulse: 68 72 67  Resp: 14 19 18   Temp: 98.1 F (36.7 C)    TempSrc: Oral    SpO2: 100% 97% 100%  Weight: 93 kg    Height: 5' 9 (1.753 m)      Intake/Output Summary (Last 24 hours) at 05/10/2024 1634 Last data filed at 05/10/2024 1615 Gross per 24 hour  Intake --  Output 800 ml  Net -800 ml      05/10/2024    1:20 PM 03/01/2024   12:03 PM  02/22/2024     9:59 AM  Last 3 Weights  Weight (lbs) 205 lb 197 lb 6.4 oz 190 lb 11.2 oz  Weight (kg) 92.987 kg 89.54 kg 86.501 kg     Body mass index is 30.27 kg/m.  General: Chronically ill-appearing, noted to be in mild discomfort HEENT: normal Neck: no JVD Vascular: No carotid bruits; Distal pulses 2+ bilaterally Cardiac:  normal S1, S2; RRR; no murmur  Lungs: Rhonchorous to auscultation bilaterally, congested productive cough with yellow phlegm Abd: soft, nontender, no hepatomegaly  Ext: Trace pretibial edema BLE Musculoskeletal:  No deformities, BUE and BLE strength normal and equal Skin: warm and clammy Neuro:  CNs 2-12 intact, no focal abnormalities noted Psych:  Normal affect   EKG:  The EKG was personally reviewed and demonstrates:  Has not been released to view in Epic as of yet Telemetry:  Telemetry was personally reviewed and demonstrates: Sinus rhythm with unifocal PVCs 1 episode of nonsustained V. tach  Relevant CV Studies: 2D echo 01/25/2024 [facility]): Summary   1. Technically difficult study.    2. The left ventricle is upper normal in size with mildly increased wall  thickness.   3. The left ventricular systolic function is severely decreased, LVEF is  visually estimated at 25%.    4. There is grade I diastolic dysfunction (impaired relaxation).    5. There is mild mitral valve regurgitation.    6. The aorta at the sinuses of Valsalva and ascending aorta is mildly  dilated.   7. The right ventricle is normal in size, with normal systolic function.    Limited echo 10/13/2023: 1. Left ventricular ejection fraction, by estimation, is 25 to 30%. The  left ventricle has severely decreased function. The left ventricular  internal cavity size was mildly dilated.   2. Right ventricular systolic function is low normal. The right  ventricular size is normal. There is normal pulmonary artery systolic  pressure.   3. Left atrial size was moderately dilated.   4. The mitral valve is  normal in structure. Moderate mitral valve  regurgitation.   5. The aortic valve is tricuspid. Aortic valve regurgitation is not  visualized.   6. Aortic dilatation noted. There is mild dilatation of the aortic root,  measuring 40 mm.   7. The inferior vena cava is normal in size with greater than 50%  respiratory variability, suggesting right atrial pressure of 3 mmHg.    LHC 05/01/2023:   Prox LAD to Mid LAD lesion is 99% stenosed.   Mid LAD lesion is 60% stenosed.   1st Diag lesion is 70% stenosed.   Prox RCA lesion is 95% stenosed.   Mid RCA lesion is 30% stenosed.   RPDA lesion is 60% stenosed.   Dist LAD lesion is 99% stenosed.   A drug-eluting stent was successfully placed using a STENT ONYX FRONTIER 4.0X15.   Balloon angioplasty was performed using a BALLN Norman EUPHORA RX 2.75X20.   Post intervention, there is a 20% residual stenosis.   Post intervention, there is a 0% residual stenosis.   There is severe left ventricular systolic dysfunction.   LV end diastolic pressure is severely elevated.   There is moderate (3+) mitral regurgitation.   1.  Severe two-vessel coronary artery disease with subtotal occlusion of the proximal LAD, diffuse moderate mid LAD disease and severe distal LAD disease.  In addition, there is 95% in the proximal right coronary artery with faint right to left collaterals to the LAD.  The  left circumflex has mild nonobstructive disease and OM 3 is large and reaches the apex. 2.  Severely reduced LV systolic function with an EF of 25%.  Severely elevated left ventricular end-diastolic pressure at 34 mmHg. 3.  Successful balloon angioplasty to the proximal LAD and drug-eluting stent placement to the proximal right coronary artery.   Recommendations: The patient developed progressive pulmonary edema as the case progressed.  He was given 1 dose of IV furosemide  and was started on nitroglycerin  drip.  He will be started on BiPAP. I elected not to place a stent in  the LAD given poor distal flow and diffuse disease overall.  In addition, placing a stent would risk closing the first diagonal. Recommend optimizing his heart failure and volume overload and consider relook angiography in the near future once he is optimized. I consulted advanced heart failure.   2D echo 04/30/2023: 1. Left ventricular ejection fraction, by estimation, is 30 to 35%. Left  ventricular ejection fraction by PLAX is 32 %. The left ventricle has  moderately decreased function. The left ventricle demonstrates global  hypokinesis with severe hypokinesis of  the anterior, anteroseptal and apical region. The left ventricular  internal cavity size was mildly dilated. There is mild left ventricular  hypertrophy. Left ventricular diastolic parameters are consistent with  Grade II diastolic dysfunction  (pseudonormalization).   2. Right ventricular systolic function is normal. The right ventricular  size is normal. There is normal pulmonary artery systolic pressure.   3. The mitral valve is normal in structure. Moderate mitral valve  regurgitation. No evidence of mitral stenosis.   4. The aortic valve is normal in structure. Aortic valve regurgitation is  not visualized. No aortic stenosis is present.   5. There is mild dilatation of the ascending aorta, measuring 42 mm.   6. The inferior vena cava is normal in size with <50% respiratory  variability, suggesting right atrial pressure of 8 mmHg.    Laboratory Data: High Sensitivity Troponin:   Recent Labs  Lab 05/10/24 1322  TROPONINIHS 44*     Chemistry Recent Labs  Lab 05/10/24 1322  NA 137  K 3.4*  CL 105  CO2 23  GLUCOSE 142*  BUN 16  CREATININE 0.94  CALCIUM  8.7*  GFRNONAA >60  ANIONGAP 9    Recent Labs  Lab 05/10/24 1322  PROT 7.3  ALBUMIN  4.0  AST 27  ALT 16  ALKPHOS 85  BILITOT 1.1   Lipids No results for input(s): CHOL, TRIG, HDL, LABVLDL, LDLCALC, CHOLHDL in the last 168 hours.   Hematology Recent Labs  Lab 05/10/24 1322  WBC 4.2  RBC 3.03*  HGB 8.7*  HCT 27.5*  MCV 90.8  MCH 28.7  MCHC 31.6  RDW 14.9  PLT 168   Thyroid No results for input(s): TSH, FREET4 in the last 168 hours.  BNPNo results for input(s): BNP, PROBNP in the last 168 hours.  DDimer No results for input(s): DDIMER in the last 168 hours.  Radiology/Studies:  No results found.   Assessment and Plan: Coronary artery disease with chest pain and elevated high-sensitivity troponin -Presented with complaints of chest pain - Cough and upper respiratory infection for the past several days -High-sensitivity troponin 44, continue to trend until peak -Continue on telemetry monitoring -Heparin  infusion, hold apixaban  restarted on discharge - Continue atorvastatin  40 mg daily - Prior left heart catheterization 04/2023 revealed severe two-vessel coronary artery disease with subtotal occlusion of the proximal LAD, diffuse moderate LAD disease and severe distal  LAD disease, 95% to the proximal RCA with faint right to left collaterals to the LAD.  Successful balloon angioplasty of the proximal LAD and a drug-eluting stent placement to the proximal RCA - Can use Nitrostat  or Nitropaste for continued breakthrough chest discomfort - Continue to trend troponins -Respiratory panel negative for COVID/flu/RSV - EKG as needed for pain or changes - Will reevaluate for further ischemic workup over the weekend  HFrEF with ICM -Echocardiogram completed at [facility] 01/25/2024 revealed an LVEF estimated at 25%, G1 DD, mild mitral valve regurgitation -Continues to have complaints of shortness of breath -Chest x-ray reveals pulmonary edema clinically consistent with CHF -Currently maintaining oxygen saturations on room air -Given 40 mg of IV furosemide  to once in the emergency department consider starting twice daily dosing -Relatively euvolemic on exam - Continued on GDMT of Entresto  24/26 mg twice daily,  furosemide , and Toprol -XL 25 mg daily - Escalate GDMT as tolerated - Continue with heart failure education - Daily weights and I's and O's  Recurrent nephrolithiasis with anemia and CKD stage IIIb - Followed by urology - Hemoglobin 8.7 -Recommend trending daily CBCs -Serum creatinine 0.94 -Monitor urine output - Monitor/trend/replete electrolytes as needed - Daily BMP - Avoid nephrotoxic agents were able  Hypokalemia - Serum potassium 3.4 - Potassium supplementation ordered - Monitor/trend/replete electrolytes as needed - Recommend keeping potassium greater than 4 less than 5 - Daily BMP  History of pulmonary embolism -previously noted on VQ scan -repeat CTA showed no PE -continued Heparin  and restart Eliquis  on discharge  Risk Assessment/Risk Scores:    TIMI Risk Score for Unstable Angina or Non-ST Elevation MI:   The patient's TIMI risk score is 5, which indicates a 26% risk of all cause mortality, new or recurrent myocardial infarction or need for urgent revascularization in the next 14 days.  [place]  Heart Association (NYHA) Functional Class NYHA Class II       For questions or updates, please contact [facility] Please consult [email or web address] for contact info under    Signed, Ilene Witcher, NP  05/10/2024 4:34 PM

## 2024-05-10 NOTE — Assessment & Plan Note (Signed)
 BMI 30.2, complicates care, class I obesity

## 2024-05-11 DIAGNOSIS — N1831 Chronic kidney disease, stage 3a: Secondary | ICD-10-CM | POA: Diagnosis not present

## 2024-05-11 DIAGNOSIS — I5043 Acute on chronic combined systolic (congestive) and diastolic (congestive) heart failure: Secondary | ICD-10-CM | POA: Diagnosis not present

## 2024-05-11 DIAGNOSIS — D649 Anemia, unspecified: Secondary | ICD-10-CM | POA: Diagnosis not present

## 2024-05-11 DIAGNOSIS — I25118 Atherosclerotic heart disease of native coronary artery with other forms of angina pectoris: Secondary | ICD-10-CM | POA: Diagnosis not present

## 2024-05-11 DIAGNOSIS — I259 Chronic ischemic heart disease, unspecified: Secondary | ICD-10-CM | POA: Diagnosis not present

## 2024-05-11 LAB — CBC
HCT: 30 % — ABNORMAL LOW (ref 39.0–52.0)
Hemoglobin: 9.4 g/dL — ABNORMAL LOW (ref 13.0–17.0)
MCH: 28.4 pg (ref 26.0–34.0)
MCHC: 31.3 g/dL (ref 30.0–36.0)
MCV: 90.6 fL (ref 80.0–100.0)
Platelets: 163 10*3/uL (ref 150–400)
RBC: 3.31 MIL/uL — ABNORMAL LOW (ref 4.22–5.81)
RDW: 15.1 % (ref 11.5–15.5)
WBC: 4.8 10*3/uL (ref 4.0–10.5)
nRBC: 0 % (ref 0.0–0.2)

## 2024-05-11 LAB — IRON AND TIBC
Iron: 35 ug/dL — ABNORMAL LOW (ref 45–182)
Saturation Ratios: 7 % — ABNORMAL LOW (ref 17.9–39.5)
TIBC: 476 ug/dL — ABNORMAL HIGH (ref 250–450)
UIBC: 441 ug/dL

## 2024-05-11 LAB — APTT
aPTT: 141 s — ABNORMAL HIGH (ref 24–36)
aPTT: 61 s — ABNORMAL HIGH (ref 24–36)

## 2024-05-11 LAB — BASIC METABOLIC PANEL WITH GFR
Anion gap: 9 (ref 5–15)
BUN: 14 mg/dL (ref 8–23)
CO2: 24 mmol/L (ref 22–32)
Calcium: 8.9 mg/dL (ref 8.9–10.3)
Chloride: 106 mmol/L (ref 98–111)
Creatinine, Ser: 1.01 mg/dL (ref 0.61–1.24)
GFR, Estimated: >60 mL/min (ref >60)
Glucose, Bld: 206 mg/dL — ABNORMAL HIGH (ref 70–99)
Potassium: 3.6 mmol/L (ref 3.5–5.1)
Sodium: 139 mmol/L (ref 135–145)

## 2024-05-11 LAB — MAGNESIUM: Magnesium: 1.9 mg/dL (ref 1.7–2.4)

## 2024-05-11 LAB — HEPARIN LEVEL (UNFRACTIONATED): Heparin Unfractionated: >1.1 [IU]/mL — ABNORMAL HIGH (ref 0.30–0.70)

## 2024-05-11 LAB — PROCALCITONIN: Procalcitonin: <0.1 ng/mL

## 2024-05-11 LAB — FERRITIN: Ferritin: 17 ng/mL — ABNORMAL LOW (ref 24–336)

## 2024-05-11 LAB — HIV ANTIBODY (ROUTINE TESTING W REFLEX): HIV Screen 4th Generation wRfx: NONREACTIVE

## 2024-05-11 MED ORDER — POTASSIUM CHLORIDE CRYS ER 20 MEQ PO TBCR
40.0000 meq | EXTENDED_RELEASE_TABLET | Freq: Once | ORAL | Status: AC
  Administered 2024-05-11: 40 meq via ORAL
  Filled 2024-05-11: qty 2

## 2024-05-11 MED ORDER — IRON SUCROSE 200 MG IVPB - SIMPLE MED
200.0000 mg | Freq: Once | Status: AC
  Administered 2024-05-11: 200 mg via INTRAVENOUS
  Filled 2024-05-11: qty 200

## 2024-05-11 MED ORDER — TRAZODONE HCL 100 MG PO TABS
100.0000 mg | ORAL_TABLET | Freq: Every evening | ORAL | Status: DC | PRN
  Administered 2024-05-11 – 2024-05-13 (×3): 100 mg via ORAL
  Filled 2024-05-11 (×3): qty 1

## 2024-05-11 MED ORDER — APIXABAN 5 MG PO TABS
5.0000 mg | ORAL_TABLET | Freq: Two times a day (BID) | ORAL | Status: DC
  Administered 2024-05-11 – 2024-05-12 (×3): 5 mg via ORAL
  Filled 2024-05-11 (×3): qty 1

## 2024-05-11 MED ORDER — HEPARIN (PORCINE) 25000 UT/250ML-% IV SOLN
950.0000 [IU]/h | INTRAVENOUS | Status: DC
  Administered 2024-05-11: 950 [IU]/h via INTRAVENOUS

## 2024-05-11 MED ORDER — DM-GUAIFENESIN ER 30-600 MG PO TB12
1.0000 | ORAL_TABLET | Freq: Two times a day (BID) | ORAL | Status: DC
  Administered 2024-05-11 – 2024-05-14 (×7): 1 via ORAL
  Filled 2024-05-11 (×7): qty 1

## 2024-05-11 MED ORDER — HEPARIN (PORCINE) 25000 UT/250ML-% IV SOLN: 950.0000 [IU]/h | INTRAVENOUS | Status: DC

## 2024-05-11 MED ORDER — APIXABAN 5 MG PO TABS: 5.0000 mg | ORAL_TABLET | Freq: Two times a day (BID) | ORAL | Status: DC

## 2024-05-11 NOTE — Plan of Care (Signed)
  Problem: Education: Goal: Knowledge of General Education information will improve Description: Including pain rating scale, medication(s)/side effects and non-pharmacologic comfort measures Outcome: Progressing   Problem: Health Behavior/Discharge Planning: Goal: Ability to manage health-related needs will improve Outcome: Progressing   Problem: Clinical Measurements: Goal: Ability to maintain clinical measurements within normal limits will improve Outcome: Progressing Goal: Diagnostic test results will improve Outcome: Progressing Goal: Cardiovascular complication will be avoided Outcome: Progressing   Problem: Activity: Goal: Risk for activity intolerance will decrease Outcome: Progressing   Problem: Nutrition: Goal: Adequate nutrition will be maintained Outcome: Progressing   Problem: Elimination: Goal: Will not experience complications related to bowel motility Outcome: Progressing   Problem: Pain Managment: Goal: General experience of comfort will improve and/or be controlled Outcome: Progressing

## 2024-05-11 NOTE — Progress Notes (Signed)
 PROGRESS NOTE    Ronnie Mann  FMW:[id number] DOB: 05-30-1956 DOA: 05/10/2024 PCP: Bernardo Fend, DO   Brief Narrative:    68 y.o. M with sCHF EF 25%, HTN, CAD s/p PCI 04/2023, CKD IIIa baseline 1.2-1.6, pneumothorax last spring followed by PE in Mar 2025 on Eliquis , obesity and hx kidney stones presented with chest pain.  He has been admitted with acute on chronic combined systolic and diastolic CHF exacerbation and has been started on IV diuresis.  Appreciate cardiology evaluation with recommendations to discontinue heparin  and nitroglycerin  drip today and start Eliquis , but continue IV diuresis for another day.  Suspect he may have bronchitis that is likely viral in etiology.  Assessment & Plan:   Principal Problem:   Acute on chronic combined systolic and diastolic CHF (congestive heart failure) (HCC) Active Problems:   Recurrent nephrolithiasis   Essential hypertension   Chest pain due to myocardial ischemia   Myocardial injury   HLD (hyperlipidemia)   CAD (coronary artery disease)   Obesity (BMI 30-39.9)   Normocytic anemia   CKD stage 3a, GFR 45-59 ml/min (HCC)   History of pulmonary embolus (PE)   Hypokalemia  Assessment and Plan:   Acute on chronic combined systolic and diastolic CHF (congestive heart failure) (HCC) Presented with SOB on exertion, orthopnea, ankle swelling, edema on CXR with pleural effusions.    Echo last Nov showed EF 25-30% -Furosemide  40 mg IV twice a day to continue for now per cardiology -K supplement -Strict I/Os, daily weights, telemetry  -Daily monitoring renal function - Appreciate ongoing cardiology management with discontinuation of nitroglycerin  drip and heparin  drip.  NSVT - 6 beat run of V. tach this a.m. - Continue to monitor potassium and magnesium and keep at 4 and 2 respectively - Continue metoprolol  - Continue to monitor on telemetry  Bronchitis versus atypical pneumonia - Check procalcitonin, no elevation of  leukocytosis noted - Likely viral with no need for antibiotics at this time - Mucolytics - Breathing treatments as needed  History of pulmonary embolus (PE) Heparin  drip discontinued and now on Eliquis   CKD stage 3a, GFR 45-59 ml/min (HCC) Creatinine lower than recent baseline  Normocytic anemia Baseline Hgb 8-10 over last year.  Stable within range - Iron  indices are noted to be low - Plan to give IV iron  infusion today  Insomnia - Patient cannot sleep despite his home dose of trazodone  50 mg, increased to 100 mg for tonight  Obesity (BMI 30-39.9) BMI 30.2, complicates care, class I obesity  CAD (coronary artery disease) See above    DVT prophylaxis:Heparin  drip to Eliquis  Code Status: Full Family Communication: None at bedside Disposition Plan:  Status is: Observation The patient will require care spanning > 2 midnights and should be moved to inpatient because: Need for IV medications.   Consultants:  Cardiology  Procedures:  None  Antimicrobials:  None   Subjective: Patient seen and evaluated today with exhaustion due to poor sleep and continues to have some chest congestion and cough.  He denies any chest pain.  Objective: Vitals:   05/11/24 0500 05/11/24 0700 05/11/24 1000 05/11/24 1100  BP: 128/64 123/69 130/80 112/79  Pulse:  60 82 86  Resp:  15 (!) 21 20  Temp:  97.9 F (36.6 C)  97.6 F (36.4 C)  TempSrc:  Oral  Oral  SpO2:  95% 97% 93%  Weight: 84.1 kg     Height:        Intake/Output Summary (Last 24 hours) at  05/11/2024 1319 Last data filed at 05/11/2024 1100 Gross per 24 hour  Intake 569.32 ml  Output 4375 ml  Net -3805.68 ml   Filed Weights   05/10/24 1320 05/10/24 2059 05/11/24 0500  Weight: 93 kg 85.4 kg 84.1 kg    Examination:  General exam: Appears calm and comfortable  Respiratory system: Congested bilaterally. Respiratory effort normal. Cardiovascular system: S1 & S2 heard, RRR.  Gastrointestinal system: Abdomen is  soft Central nervous system: Alert and awake Extremities: No edema Skin: No significant lesions noted Psychiatry: Flat affect.    Data Reviewed: I have personally reviewed following labs and imaging studies  CBC: Recent Labs  Lab 05/10/24 1322 05/11/24 0429  WBC 4.2 4.8  HGB 8.7* 9.4*  HCT 27.5* 30.0*  MCV 90.8 90.6  PLT 168 163   Basic Metabolic Panel: Recent Labs  Lab 05/10/24 1322 05/11/24 0429  NA 137 139  K 3.4* 3.6  CL 105 106  CO2 23 24  GLUCOSE 142* 206*  BUN 16 14  CREATININE 0.94 1.01  CALCIUM  8.7* 8.9  MG  --  1.9   GFR: Estimated Creatinine Clearance: 70 mL/min (by C-G formula based on SCr of 1.01 mg/dL). Liver Function Tests: Recent Labs  Lab 05/10/24 1322  AST 27  ALT 16  ALKPHOS 85  BILITOT 1.1  PROT 7.3  ALBUMIN  4.0   No results for input(s): LIPASE, AMYLASE in the last 168 hours. No results for input(s): AMMONIA in the last 168 hours. Coagulation Profile: Recent Labs  Lab 05/10/24 1518  INR 1.3*   Cardiac Enzymes: No results for input(s): CKTOTAL, CKMB, CKMBINDEX, TROPONINI in the last 168 hours. BNP (last 3 results) Recent Labs    12/21/23 1421  PROBNP 1,262*   HbA1C: No results for input(s): HGBA1C in the last 72 hours. CBG: No results for input(s): GLUCAP in the last 168 hours. Lipid Profile: No results for input(s): CHOL, HDL, LDLCALC, TRIG, CHOLHDL, LDLDIRECT in the last 72 hours. Thyroid Function Tests: No results for input(s): TSH, T4TOTAL, FREET4, T3FREE, THYROIDAB in the last 72 hours. Anemia Panel: Recent Labs    05/11/24 0429  FERRITIN 17*  TIBC 476*  IRON  35*   Sepsis Labs: No results for input(s): PROCALCITON, LATICACIDVEN in the last 168 hours.  Recent Results (from the past 240 hours)  Resp panel by RT-PCR (RSV, Flu A&B, Covid) Anterior Nasal Swab     Status: None   Collection Time: 05/10/24  3:09 PM   Specimen: Anterior Nasal Swab  Result Value Ref Range  Status   SARS Coronavirus 2 by RT PCR NEGATIVE NEGATIVE Final    Comment: (NOTE) SARS-CoV-2 target nucleic acids are NOT DETECTED.  The SARS-CoV-2 RNA is generally detectable in upper respiratory specimens during the acute phase of infection. The lowest concentration of SARS-CoV-2 viral copies this assay can detect is 138 copies/mL. A negative result does not preclude SARS-Cov-2 infection and should not be used as the sole basis for treatment or other patient management decisions. A negative result may occur with  improper specimen collection/handling, submission of specimen other than nasopharyngeal swab, presence of viral mutation(s) within the areas targeted by this assay, and inadequate number of viral copies(<138 copies/mL). A negative result must be combined with clinical observations, patient history, and epidemiological information. The expected result is Negative.  Fact Sheet for Patients:  BloggerCourse.com  Fact Sheet for Healthcare Providers:  SeriousBroker.it  This test is no t yet approved or cleared by the [place]  FDA and  has been authorized for detection and/or diagnosis of SARS-CoV-2 by FDA under an Emergency Use Authorization (EUA). This EUA will remain  in effect (meaning this test can be used) for the duration of the COVID-19 declaration under Section 564(b)(1) of the Act, 21 U.S.C.section 360bbb-3(b)(1), unless the authorization is terminated  or revoked sooner.       Influenza A by PCR NEGATIVE NEGATIVE Final   Influenza B by PCR NEGATIVE NEGATIVE Final    Comment: (NOTE) The Xpert Xpress SARS-CoV-2/FLU/RSV plus assay is intended as an aid in the diagnosis of influenza from Nasopharyngeal swab specimens and should not be used as a sole basis for treatment. Nasal washings and aspirates are unacceptable for Xpert Xpress SARS-CoV-2/FLU/RSV testing.  Fact Sheet for  Patients: BloggerCourse.com  Fact Sheet for Healthcare Providers: SeriousBroker.it  This test is not yet approved or cleared by the [place]  FDA and has been authorized for detection and/or diagnosis of SARS-CoV-2 by FDA under an Emergency Use Authorization (EUA). This EUA will remain in effect (meaning this test can be used) for the duration of the COVID-19 declaration under Section 564(b)(1) of the Act, 21 U.S.C. section 360bbb-3(b)(1), unless the authorization is terminated or revoked.     Resp Syncytial Virus by PCR NEGATIVE NEGATIVE Final    Comment: (NOTE) Fact Sheet for Patients: BloggerCourse.com  Fact Sheet for Healthcare Providers: SeriousBroker.it  This test is not yet approved or cleared by the [place]  FDA and has been authorized for detection and/or diagnosis of SARS-CoV-2 by FDA under an Emergency Use Authorization (EUA). This EUA will remain in effect (meaning this test can be used) for the duration of the COVID-19 declaration under Section 564(b)(1) of the Act, 21 U.S.C. section 360bbb-3(b)(1), unless the authorization is terminated or revoked.  Performed at [facility], [address] [place], [place] [address]          Radiology Studies: DG Chest [place] 1 View Result Date: 05/10/2024 CLINICAL DATA:  Chest pain, dizziness, shortness of breath EXAM: PORTABLE CHEST 1 VIEW COMPARISON:  02/10/2024 FINDINGS: Mild enlargement of the cardiopericardial silhouette noted with indistinct pulmonary vasculature and interstitial accentuation favoring interstitial edema over atypical pneumonia. Large hiatal hernia. Indistinct diaphragms, especially on the left. Cannot exclude airspace opacities along the diaphragms. Layering pleural effusions could have a similar appearance. The patient is rotated to the left on today's radiograph, reducing diagnostic  sensitivity and specificity. Atherosclerotic calcification of the aortic arch. IMPRESSION: 1. Mild enlargement of the cardiopericardial silhouette with indistinct pulmonary vasculature and interstitial accentuation favoring interstitial edema over atypical pneumonia. 2. Indistinct diaphragms, especially on the left. Cannot exclude airspace opacities along the diaphragms. Layering pleural effusions could have a similar appearance. 3. Large hiatal hernia. 4. Aortic Atherosclerosis (ICD10-I70.0). Electronically Signed   By: Ryan Salvage M.D.   On: 05/10/2024 16:48        Scheduled Meds:  apixaban   5 mg Oral BID   aspirin   81 mg Oral Daily   atorvastatin   40 mg Oral Daily   fesoterodine   4 mg Oral Daily   furosemide   40 mg Intravenous BID   metoprolol  succinate  25 mg Oral Daily   potassium chloride   40 mEq Oral BID   sacubitril -valsartan   1 tablet Oral BID   tamsulosin   0.4 mg Oral QPC supper     LOS: 0 days    Time spent: 55 minutes    Medha Pippen JONETTA Fairly, DO [facility]  If 7PM-7AM, please contact night-coverage [email or web address] 05/11/2024, 1:19 PM

## 2024-05-11 NOTE — Consult Note (Signed)
 PHARMACY - ANTICOAGULATION CONSULT NOTE  Pharmacy Consult for Heparin  Indication: chest pain/ACS  Allergies  Allergen Reactions   Losartan  Other (See Comments)    Chest pain    Patient Measurements: Height: 5' 9 (175.3 cm) Weight: 84.1 kg (185 lb 6.5 oz) IBW/kg (Calculated) : 70.7 HEPARIN  DW (KG): 89.8  Vital Signs: Temp: 98.1 F (36.7 C) (06/21 0000) Temp Source: Oral (06/21 0000) BP: 128/64 (06/21 0500) Pulse Rate: 68 (06/21 0000)  Labs: Recent Labs    05/10/24 1322 05/10/24 1518 05/10/24 1751 05/10/24 2151 05/11/24 0429  HGB 8.7*  --   --   --  9.4*  HCT 27.5*  --   --   --  30.0*  PLT 168  --   --   --  163  APTT  --  38*  --  90* 141*  LABPROT  --  16.7*  --   --   --   INR  --  1.3*  --   --   --   HEPARINUNFRC  --  >1.10*  --   --  >1.10*  CREATININE 0.94  --   --   --   --   TROPONINIHS 44*  --  41*  --   --     Estimated Creatinine Clearance: 75.2 mL/min (by C-G formula based on SCr of 0.94 mg/dL).   Medical History: Past Medical History:  Diagnosis Date   CHF (congestive heart failure) (HCC)    Coronary artery disease 05/01/2023   PCI/DES   HFrEF (heart failure with reduced ejection fraction) (HCC) 05/01/2023   Hiatal hernia    HLD (hyperlipidemia)    HTN (hypertension)    Ischemic cardiomyopathy 05/01/2023   LVEF 30-35%   Kidney stones    Pneumothorax     Medications:  Apixaban  5mg  twice daily, last dose: morning of 6/20  Assessment: 68 y.o. male with a hx of coronary artery disease with NSTEMI (04/2023) with PCI to the proximal LAD and RCA, PE(on apixaban ), HFrEF secondary to ICM, CKD stage IIIb, nephrolithiasis requiring multiple surgeries and lithotripsies, and hiatal hernia. Pharmacy has been consulted to initiate and dose continuous heparin  infusion.  Baseline labs: aPTT 38 sec, HL>1.1, INR 1.3, Hgb 8.7, Plts 168(b/l in the 200s)  Goal of Therapy:  Heparin  level 0.3-0.7 units/ml aPTT 66-102 seconds Monitor platelets by  anticoagulation protocol: Yes   Plan:  6/21 @ 0429:  aPTT = 141,  HL = > 1.10 - aPTT elevated,  HL elevated from Eliquis  PTA  - spoke with phlebotomist who states sample was drawn from opposite arm as infusion - will hold heparin  infusion for 1 hr and restart at 950 units/hr - recheck aPTT 6 hrs after restart - recheck HL on 6/22 with AM labs  - Will continue to dose via aPTT until both levels correlate, then will transition to HL dosing - Continue to monitor H&H and platelets  Kinslee Dalpe D, PharmD Clinical Pharmacist 05/11/2024 5:21 AM

## 2024-05-11 NOTE — Progress Notes (Signed)
 Heparin  drip 1250 units/hr paused at 0520 am.

## 2024-05-11 NOTE — Care Management Obs Status (Signed)
 MEDICARE OBSERVATION STATUS NOTIFICATION   Patient Details  Name: Ronnie Mann MRN: [id number] Date of Birth: [birth year]   Medicare Observation Status Notification Given:  No (patient did not want a copy)    [name] 05/11/2024, 12:17 PM

## 2024-05-11 NOTE — Progress Notes (Signed)
 Rounding Note   Patient Name: Ronnie Mann Date of Encounter: 05/11/2024  [facility] Cardiologist: [facility]  Subjective Reports he did not sleep well, typically naps during the daytime, poor sleep at night - Continued mild shortness of breath, cough Denies chest pain concerning for angina Mild productive cough, thick phlegm  Scheduled Meds:  aspirin   81 mg Oral Daily   atorvastatin   40 mg Oral Daily   fesoterodine   4 mg Oral Daily   furosemide   40 mg Intravenous BID   metoprolol  succinate  25 mg Oral Daily   potassium chloride   40 mEq Oral BID   sacubitril -valsartan   1 tablet Oral BID   tamsulosin   0.4 mg Oral QPC supper   Continuous Infusions:  heparin  950 Units/hr (05/11/24 0629)   nitroGLYCERIN  20 mcg/min (05/11/24 0348)   PRN Meds: acetaminophen  **OR** acetaminophen , albuterol , nitroGLYCERIN , traZODone    Vital Signs  Vitals:   05/11/24 0500 05/11/24 0700 05/11/24 1000 05/11/24 1100  BP: 128/64 123/69 130/80 112/79  Pulse:  60 82 86  Resp:  15 (!) 21 20  Temp:  97.9 F (36.6 C)  97.6 F (36.4 C)  TempSrc:  Oral  Oral  SpO2:  95% 97% 93%  Weight: 84.1 kg     Height:        Intake/Output Summary (Last 24 hours) at 05/11/2024 1242 Last data filed at 05/11/2024 1100 Gross per 24 hour  Intake 569.32 ml  Output 4375 ml  Net -3805.68 ml      05/11/2024    5:00 AM 05/10/2024    8:59 PM 05/10/2024    1:20 PM  Last 3 Weights  Weight (lbs) 185 lb 6.5 oz 188 lb 4.4 oz 205 lb  Weight (kg) 84.1 kg 85.4 kg 92.987 kg      Telemetry Normal sinus rhythm- Personally Reviewed  ECG   - Personally Reviewed  Physical Exam  GEN: No acute distress.   Neck: No JVD Cardiac: RRR, no murmurs, rubs, or gallops.  Respiratory: Coarse breath sounds bilaterally GI: Soft, nontender, non-distended  MS: No edema; No deformity. Neuro:  Nonfocal  Psych: Normal affect   Labs High Sensitivity Troponin:   Recent Labs  Lab 05/10/24 1322 05/10/24 1751   TROPONINIHS 44* 41*     Chemistry Recent Labs  Lab 05/10/24 1322 05/11/24 0429  NA 137 139  K 3.4* 3.6  CL 105 106  CO2 23 24  GLUCOSE 142* 206*  BUN 16 14  CREATININE 0.94 1.01  CALCIUM  8.7* 8.9  MG  --  1.9  PROT 7.3  --   ALBUMIN  4.0  --   AST 27  --   ALT 16  --   ALKPHOS 85  --   BILITOT 1.1  --   GFRNONAA >60 >60  ANIONGAP 9 9    Lipids No results for input(s): CHOL, TRIG, HDL, LABVLDL, LDLCALC, CHOLHDL in the last 168 hours.  Hematology Recent Labs  Lab 05/10/24 1322 05/11/24 0429  WBC 4.2 4.8  RBC 3.03* 3.31*  HGB 8.7* 9.4*  HCT 27.5* 30.0*  MCV 90.8 90.6  MCH 28.7 28.4  MCHC 31.6 31.3  RDW 14.9 15.1  PLT 168 163   Thyroid No results for input(s): TSH, FREET4 in the last 168 hours.  BNP Recent Labs  Lab 05/10/24 1322  BNP 2,316.8*    DDimer No results for input(s): DDIMER in the last 168 hours.   Radiology  DG Chest Port 1 View Result Date: 05/10/2024 CLINICAL DATA:  Chest pain, dizziness, shortness of breath EXAM: PORTABLE CHEST 1 VIEW COMPARISON:  02/10/2024 FINDINGS: Mild enlargement of the cardiopericardial silhouette noted with indistinct pulmonary vasculature and interstitial accentuation favoring interstitial edema over atypical pneumonia. Large hiatal hernia. Indistinct diaphragms, especially on the left. Cannot exclude airspace opacities along the diaphragms. Layering pleural effusions could have a similar appearance. The patient is rotated to the left on today's radiograph, reducing diagnostic sensitivity and specificity. Atherosclerotic calcification of the aortic arch. IMPRESSION: 1. Mild enlargement of the cardiopericardial silhouette with indistinct pulmonary vasculature and interstitial accentuation favoring interstitial edema over atypical pneumonia. 2. Indistinct diaphragms, especially on the left. Cannot exclude airspace opacities along the diaphragms. Layering pleural effusions could have a similar appearance. 3. Large  hiatal hernia. 4. Aortic Atherosclerosis (ICD10-I70.0). Electronically Signed   By: Ryan Salvage M.D.   On: 05/10/2024 16:48    Cardiac Studies    Patient Profile   Ronnie Mann is a 68 y.o. male with a hx of coronary artery disease with NSTEMI (04/2023) with PCI to the proximal LAD and RCA, HFrEF secondary to ICM, CKD stage IIIb, nephrolithiasis requiring multiple surgeries and lithotripsies, and hiatal hernia, who is being seen 05/10/2024 for the evaluation of chest pain and elevated high-sensitivity troponin    Assessment & Plan   Coronary artery disease with chest pain and elevated high-sensitivity troponin- Presenting with cough, URI symptoms Troponin minimally elevated nontrending 44,  Completed heparin  infusion -Eliquis  at discharge Continue Lipitor 40 Suspect symptoms secondary to URI, bronchitis -Given severe underlying coronary disease, does not appear to be primary ischemic event, high threshold for repeat catheterization - Prior left heart catheterization 04/2023 revealed severe two-vessel coronary artery disease with subtotal occlusion of the proximal LAD, diffuse moderate LAD disease and severe distal LAD disease, 95% to the proximal RCA with faint right to left collaterals to the LAD.  Successful balloon angioplasty of the proximal LAD and a drug-eluting stent placement to the proximal RCA   HFrEF with ICM -[facility] 01/25/[facility]24 revealed an LVEF estimated at 25%, G1 DD, mild mitral valve regurgitation -Given 40 mg of IV furosemide  to once in the emergency department  -Appears euvolemic on exam  on GDMT of Entresto  24/26 mg twice daily, furosemide , and Toprol -XL 25 mg daily  Recurrent nephrolithiasis with anemia and CKD stage IIIb - Followed by urology - Hemoglobin 9.4 Reports he is asymptomatic   Hypokalemia - Serum potassium 3.6 - Potassium supplementation twice daily   History of pulmonary embolism -previously noted on VQ scan -repeat CTA  showed no PE restart Eliquis     For questions or updates, please contact [facility] Please consult [email or web address] for contact info under     Signed, Shown Dissinger, MD  05/11/2024, 12:42 PM

## 2024-05-12 DIAGNOSIS — D649 Anemia, unspecified: Secondary | ICD-10-CM | POA: Diagnosis not present

## 2024-05-12 DIAGNOSIS — I5043 Acute on chronic combined systolic (congestive) and diastolic (congestive) heart failure: Secondary | ICD-10-CM | POA: Diagnosis not present

## 2024-05-12 DIAGNOSIS — I259 Chronic ischemic heart disease, unspecified: Secondary | ICD-10-CM | POA: Diagnosis not present

## 2024-05-12 DIAGNOSIS — I25118 Atherosclerotic heart disease of native coronary artery with other forms of angina pectoris: Secondary | ICD-10-CM | POA: Diagnosis not present

## 2024-05-12 DIAGNOSIS — N1831 Chronic kidney disease, stage 3a: Secondary | ICD-10-CM | POA: Diagnosis not present

## 2024-05-12 LAB — BASIC METABOLIC PANEL WITH GFR
Anion gap: 8 (ref 5–15)
BUN: 29 mg/dL — ABNORMAL HIGH (ref 8–23)
CO2: 25 mmol/L (ref 22–32)
Calcium: 9.3 mg/dL (ref 8.9–10.3)
Chloride: 104 mmol/L (ref 98–111)
Creatinine, Ser: 1.35 mg/dL — ABNORMAL HIGH (ref 0.61–1.24)
GFR, Estimated: 57 mL/min — ABNORMAL LOW (ref >60)
Glucose, Bld: 132 mg/dL — ABNORMAL HIGH (ref 70–99)
Potassium: 3.8 mmol/L (ref 3.5–5.1)
Sodium: 137 mmol/L (ref 135–145)

## 2024-05-12 LAB — CBC
HCT: 32.2 % — ABNORMAL LOW (ref 39.0–52.0)
Hemoglobin: 9.8 g/dL — ABNORMAL LOW (ref 13.0–17.0)
MCH: 27.7 pg (ref 26.0–34.0)
MCHC: 30.4 g/dL (ref 30.0–36.0)
MCV: 91 fL (ref 80.0–100.0)
Platelets: 180 10*3/uL (ref 150–400)
RBC: 3.54 MIL/uL — ABNORMAL LOW (ref 4.22–5.81)
RDW: 15 % (ref 11.5–15.5)
WBC: 4.5 10*3/uL (ref 4.0–10.5)
nRBC: 0 % (ref 0.0–0.2)

## 2024-05-12 LAB — MAGNESIUM: Magnesium: 2.1 mg/dL (ref 1.7–2.4)

## 2024-05-12 NOTE — Progress Notes (Addendum)
 Rounding Note   Patient Name: Ronnie Mann Date of Encounter: 05/12/2024  [facility] Cardiologist: [facility]  Subjective Reports that he slept better last night, eating full breakfast this morning, feeling better Still little bit of cough though improving, less sputum production Denies significant chest pain  Scheduled Meds:  apixaban   5 mg Oral BID   aspirin   81 mg Oral Daily   atorvastatin   40 mg Oral Daily   dextromethorphan -guaiFENesin   1 tablet Oral BID   fesoterodine   4 mg Oral Daily   metoprolol  succinate  25 mg Oral Daily   potassium chloride   40 mEq Oral BID   sacubitril -valsartan   1 tablet Oral BID   tamsulosin   0.4 mg Oral QPC supper   Continuous Infusions:   PRN Meds: acetaminophen  **OR** acetaminophen , albuterol , nitroGLYCERIN , traZODone    Vital Signs  Vitals:   05/11/24 2358 05/12/24 0347 05/12/24 0500 05/12/24 0833  BP: (!) 98/54 103/66  98/72  Pulse: 96 81  92  Resp: 16 20  15   Temp: 98.4 F (36.9 C) 98.7 F (37.1 C)  (!) 97.5 F (36.4 C)  TempSrc: Oral Oral  Oral  SpO2: 94% 91%  94%  Weight:   82.6 kg   Height:        Intake/Output Summary (Last 24 hours) at 05/12/2024 0959 Last data filed at 05/12/2024 9376 Gross per 24 hour  Intake 120 ml  Output 2900 ml  Net -2780 ml      05/12/2024    5:00 AM 05/11/2024    5:00 AM 05/10/2024    8:59 PM  Last 3 Weights  Weight (lbs) 182 lb 1.6 oz 185 lb 6.5 oz 188 lb 4.4 oz  Weight (kg) 82.6 kg 84.1 kg 85.4 kg      Telemetry Normal sinus rhythm- Personally Reviewed  ECG   - Personally Reviewed  Physical Exam  Constitutional:  oriented to person, place, and time. No distress.  HENT:  Head: Grossly normal Eyes:  no discharge. No scleral icterus.  Neck: No JVD, no carotid bruits  Cardiovascular: Regular rate and rhythm, no murmurs appreciated Pulmonary/Chest: Clear to auscultation bilaterally, no wheezes or rails Abdominal: Soft.  no distension.  no tenderness.   Musculoskeletal: Normal range of motion Neurological:  normal muscle tone. Coordination normal. No atrophy Skin: Skin warm and dry Psychiatric: normal affect, pleasant  Labs High Sensitivity Troponin:   Recent Labs  Lab 05/10/24 1322 05/10/24 1751  TROPONINIHS 44* 41*     Chemistry Recent Labs  Lab 05/10/24 1322 05/11/24 0429 05/12/24 0512  NA 137 139 137  K 3.4* 3.6 3.8  CL 105 106 104  CO2 23 24 25   GLUCOSE 142* 206* 132*  BUN 16 14 29*  CREATININE 0.94 1.01 1.35*  CALCIUM  8.7* 8.9 9.3  MG  --  1.9 2.1  PROT 7.3  --   --   ALBUMIN  4.0  --   --   AST 27  --   --   ALT 16  --   --   ALKPHOS 85  --   --   BILITOT 1.1  --   --   GFRNONAA >60 >60 57*  ANIONGAP 9 9 8     Lipids No results for input(s): CHOL, TRIG, HDL, LABVLDL, LDLCALC, CHOLHDL in the last 168 hours.  Hematology Recent Labs  Lab 05/10/24 1322 05/11/24 0429 05/12/24 0512  WBC 4.2 4.8 4.5  RBC 3.03* 3.31* 3.54*  HGB 8.7* 9.4* 9.8*  HCT 27.5* 30.0* 32.2*  MCV 90.8 90.6 91.0  MCH 28.7 28.4 27.7  MCHC 31.6 31.3 30.4  RDW 14.9 15.1 15.0  PLT 168 163 180   Thyroid No results for input(s): TSH, FREET4 in the last 168 hours.  BNP Recent Labs  Lab 05/10/24 1322  BNP 2,316.8*    DDimer No results for input(s): DDIMER in the last 168 hours.   Radiology  DG Chest Port 1 View Result Date: 05/10/2024 CLINICAL DATA:  Chest pain, dizziness, shortness of breath EXAM: PORTABLE CHEST 1 VIEW COMPARISON:  02/10/2024 FINDINGS: Mild enlargement of the cardiopericardial silhouette noted with indistinct pulmonary vasculature and interstitial accentuation favoring interstitial edema over atypical pneumonia. Large hiatal hernia. Indistinct diaphragms, especially on the left. Cannot exclude airspace opacities along the diaphragms. Layering pleural effusions could have a similar appearance. The patient is rotated to the left on today's radiograph, reducing diagnostic sensitivity and specificity.  Atherosclerotic calcification of the aortic arch. IMPRESSION: 1. Mild enlargement of the cardiopericardial silhouette with indistinct pulmonary vasculature and interstitial accentuation favoring interstitial edema over atypical pneumonia. 2. Indistinct diaphragms, especially on the left. Cannot exclude airspace opacities along the diaphragms. Layering pleural effusions could have a similar appearance. 3. Large hiatal hernia. 4. Aortic Atherosclerosis (ICD10-I70.0). Electronically Signed   By: Ryan Salvage M.D.   On: 05/10/2024 16:48    Cardiac Studies   Patient Profile   Ronnie Mann is a 68 y.o. male with a hx of coronary artery disease with NSTEMI (04/2023) with PCI to the proximal LAD and RCA, HFrEF secondary to ICM, CKD stage IIIb, nephrolithiasis requiring multiple surgeries and lithotripsies, and hiatal hernia, who is being seen 05/10/2024 for the evaluation of chest pain and elevated high-sensitivity troponin    Assessment & Plan   Coronary artery disease with chest pain and elevated high-sensitivity troponin- Presenting with cough, URI symptoms Troponin minimally elevated nontrending 44,  Completed heparin  infusion Continue Lipitor 40 -does not appear to be primary ischemic event, high threshold for repeat catheterization Continue aspirin  81   HFrEF with ICM -Echocardiogram completed at [facility] 01/25/2024 revealed an LVEF estimated at 25%, G1 DD, mild mitral valve regurgitation - Continue Entresto  24/26 mg twice daily, and Toprol -XL 25 mg daily - Would restart Lasix  20 daily at discharge  Recurrent nephrolithiasis with anemia and CKD stage IIIb - Followed by urology - Hemoglobin 9.8 Ct scan:  Residual moderate right hydronephrosis with diffuse urothelial thickening of the right renal pelvis and ureter which appears chronic and is probably postinflammatory. Punctate distal ureteral stone on the right but does not appear to be obstructing as the ureter distal to it is alsoslightly  dilated to the level of the bladder. Moderate left hydronephrosis with large 16 x 10 mm stone in the mid left ureter. 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   Hypokalemia - Serum potassium 3.8 - Potassium supplementation twice daily   History of pulmonary embolism -previously noted on VQ scan though CT scan shortly following with no PE Subsequent CTs prior to and following with no PE - Will hold Eliquis   [facility] will sign off.   Medication Recommendations: Continue medications as above Other recommendations (labs, testing, etc): No further cardiac testing Follow up as an outpatient: Outpatient follow-up in cardiology clinic   For questions or updates, please contact [facility] Please consult [email or web address] for contact info under     Signed, Daquawn Seelman, MD  05/12/2024, 9:59 AM

## 2024-05-12 NOTE — Progress Notes (Signed)
 Msg sent to Dr. Cleatus regarding BP and entresto  for hs. MD requested to continue to hold  05/12/24 2044  Vitals  Temp 98.4 F (36.9 C)  Temp Source Oral  BP 96/69  MAP (mmHg) 78  BP Location Right Arm  BP Method Automatic  Patient Position (if appropriate) Lying  Pulse Rate 83  Resp 18  MEWS COLOR  MEWS Score Color Green  Oxygen Therapy  O2 Device Room Air  MEWS Score  MEWS Temp 0  MEWS Systolic 1  MEWS Pulse 0  MEWS RR 0  MEWS LOC 0  MEWS Score 1

## 2024-05-12 NOTE — Plan of Care (Signed)
  Problem: Education: Goal: Knowledge of General Education information will improve Description: Including pain rating scale, medication(s)/side effects and non-pharmacologic comfort measures Outcome: Progressing   Problem: Health Behavior/Discharge Planning: Goal: Ability to manage health-related needs will improve Outcome: Progressing   Problem: Clinical Measurements: Goal: Ability to maintain clinical measurements within normal limits will improve Outcome: Progressing Goal: Diagnostic test results will improve Outcome: Progressing Goal: Respiratory complications will improve Outcome: Progressing Goal: Cardiovascular complication will be avoided Outcome: Progressing   Problem: Coping: Goal: Level of anxiety will decrease Outcome: Progressing   Problem: Elimination: Goal: Will not experience complications related to urinary retention Outcome: Progressing   Problem: Pain Managment: Goal: General experience of comfort will improve and/or be controlled Outcome: Progressing

## 2024-05-12 NOTE — Progress Notes (Addendum)
 PROGRESS NOTE    Ronnie Mann  FMW:[id number] DOB: 1955-12-23 DOA: 05/10/2024 PCP: Bernardo Fend, DO   Brief Narrative:    68 y.o. M with sCHF EF 25%, HTN, CAD s/p PCI 04/2023, CKD IIIa baseline 1.2-1.6, pneumothorax last spring followed by PE in Mar 2025 on Eliquis , obesity and hx kidney stones presented with chest pain.  He has been admitted with acute on chronic combined systolic and diastolic CHF exacerbation and has been started on IV diuresis.  Appreciate cardiology evaluation with recommendations to discontinue heparin  and nitroglycerin  drip today.  Suspect he may have bronchitis that is likely viral in etiology.  Assessment & Plan:   Principal Problem:   Acute on chronic combined systolic and diastolic CHF (congestive heart failure) (HCC) Active Problems:   Recurrent nephrolithiasis   Essential hypertension   Chest pain due to myocardial ischemia   Myocardial injury   HLD (hyperlipidemia)   CAD (coronary artery disease)   Obesity (BMI 30-39.9)   Normocytic anemia   CKD stage 3a, GFR 45-59 ml/min (HCC)   History of pulmonary embolus (PE)   Hypokalemia  Assessment and Plan:   Acute on chronic combined systolic and diastolic CHF (congestive heart failure) (HCC) Presented with SOB on exertion, orthopnea, ankle swelling, edema on CXR with pleural effusions.    Echo last Nov showed EF 25-30% - Have held IV Lasix  on 6/22 due to increasing creatinine and decreased blood pressure -K supplement -Strict I/Os, daily weights, telemetry -down 6 L and 3 kg since admission -Daily monitoring renal function - Appreciate ongoing cardiology consultation: No further ischemic workup needed and they have signed off - Blood pressure on the lower side so I have asked nursing to ambulate patient to see if he has symptoms-held Entresto  on 6/22  NSVT - Replete potassium and magnesium Bronchitis versus atypical pneumonia - Check procalcitonin, no elevation of leukocytosis noted - Likely  viral with no need for antibiotics at this time - Mucolytics - Breathing treatments as needed  History of pulmonary embolus (PE) Heparin  drip discontinued and now on Eliquis   CKD stage 3a, GFR 45-59 ml/min (HCC) -Baseline creatinine unclear but perhaps around 1.3  Normocytic anemia Baseline Hgb 8-10 over last year.  Stable within range - Iron  indices are noted to be low and he was given IV iron   Insomnia - Patient cannot sleep despite his home dose of trazodone  50 mg, increased to 100 mg for tonight  Obesity (BMI 30-39.9) BMI 30.2, complicates care, class I obesity  CAD (coronary artery disease) See above    DVT prophylaxis:Heparin  drip to Eliquis  Code Status: Full Family Communication: None at bedside Disposition Plan:  Status is: Observation  Suspect patient can DC in the morning if creatinine stable and blood pressure improved  Consultants:  Cardiology    Subjective: No dizziness while sitting in the bed but has not been out of bed  Objective: Vitals:   05/11/24 2358 05/12/24 0347 05/12/24 0500 05/12/24 0833  BP: (!) 98/54 103/66  98/72  Pulse: 96 81  92  Resp: 16 20  15   Temp: 98.4 F (36.9 C) 98.7 F (37.1 C)  (!) 97.5 F (36.4 C)  TempSrc: Oral Oral  Oral  SpO2: 94% 91%  94%  Weight:   82.6 kg   Height:        Intake/Output Summary (Last 24 hours) at 05/12/2024 1139 Last data filed at 05/12/2024 0900 Gross per 24 hour  Intake 480 ml  Output 2550 ml  Net -2070 ml  Filed Weights   05/10/24 2059 05/11/24 0500 05/12/24 0500  Weight: 85.4 kg 84.1 kg 82.6 kg    Examination:   General: Appearance:     Overweight male in no acute distress     Lungs:   Diminished, respirations unlabored  Heart:    Normal heart rate. .   MS:   All extremities are intact.   Neurologic:   Awake, alert       Data Reviewed: I have personally reviewed following labs and imaging studies  CBC: Recent Labs  Lab 05/10/24 1322 05/11/24 0429 05/12/24 0512  WBC  4.2 4.8 4.5  HGB 8.7* 9.4* 9.8*  HCT 27.5* 30.0* 32.2*  MCV 90.8 90.6 91.0  PLT 168 163 180   Basic Metabolic Panel: Recent Labs  Lab 05/10/24 1322 05/11/24 0429 05/12/24 0512  NA 137 139 137  K 3.4* 3.6 3.8  CL 105 106 104  CO2 23 24 25   GLUCOSE 142* 206* 132*  BUN 16 14 29*  CREATININE 0.94 1.01 1.35*  CALCIUM  8.7* 8.9 9.3  MG  --  1.9 2.1   GFR: Estimated Creatinine Clearance: 52.4 mL/min (A) (by C-G formula based on SCr of 1.35 mg/dL (H)). Liver Function Tests: Recent Labs  Lab 05/10/24 1322  AST 27  ALT 16  ALKPHOS 85  BILITOT 1.1  PROT 7.3  ALBUMIN  4.0   No results for input(s): LIPASE, AMYLASE in the last 168 hours. No results for input(s): AMMONIA in the last 168 hours. Coagulation Profile: Recent Labs  Lab 05/10/24 1518  INR 1.3*   Cardiac Enzymes: No results for input(s): CKTOTAL, CKMB, CKMBINDEX, TROPONINI in the last 168 hours. BNP (last 3 results) Recent Labs    12/21/23 1421  PROBNP 1,262*   HbA1C: No results for input(s): HGBA1C in the last 72 hours. CBG: No results for input(s): GLUCAP in the last 168 hours. Lipid Profile: No results for input(s): CHOL, HDL, LDLCALC, TRIG, CHOLHDL, LDLDIRECT in the last 72 hours. Thyroid Function Tests: No results for input(s): TSH, T4TOTAL, FREET4, T3FREE, THYROIDAB in the last 72 hours. Anemia Panel: Recent Labs    05/11/24 0429  FERRITIN 17*  TIBC 476*  IRON  35*   Sepsis Labs: Recent Labs  Lab 05/11/24 0429  PROCALCITON <0.10    Recent Results (from the past 240 hours)  Resp panel by RT-PCR (RSV, Flu A&B, Covid) Anterior Nasal Swab     Status: None   Collection Time: 05/10/24  3:09 PM   Specimen: Anterior Nasal Swab  Result Value Ref Range Status   SARS Coronavirus 2 by RT PCR NEGATIVE NEGATIVE Final    Comment: (NOTE) SARS-CoV-2 target nucleic acids are NOT DETECTED.  The SARS-CoV-2 RNA is generally detectable in upper respiratory specimens  during the acute phase of infection. The lowest concentration of SARS-CoV-2 viral copies this assay can detect is 138 copies/mL. A negative result does not preclude SARS-Cov-2 infection and should not be used as the sole basis for treatment or other patient management decisions. A negative result may occur with  improper specimen collection/handling, submission of specimen other than nasopharyngeal swab, presence of viral mutation(s) within the areas targeted by this assay, and inadequate number of viral copies(<138 copies/mL). A negative result must be combined with clinical observations, patient history, and epidemiological information. The expected result is Negative.  Fact Sheet for Patients:  BloggerCourse.com  Fact Sheet for Healthcare Providers:  SeriousBroker.it  This test is no t yet approved or cleared by the [place]  FDA and  has been authorized for detection and/or diagnosis of SARS-CoV-2 by FDA under an Emergency Use Authorization (EUA). This EUA will remain  in effect (meaning this test can be used) for the duration of the COVID-19 declaration under Section 564(b)(1) of the Act, 21 U.S.C.section 360bbb-3(b)(1), unless the authorization is terminated  or revoked sooner.       Influenza A by PCR NEGATIVE NEGATIVE Final   Influenza B by PCR NEGATIVE NEGATIVE Final    Comment: (NOTE) The Xpert Xpress SARS-CoV-2/FLU/RSV plus assay is intended as an aid in the diagnosis of influenza from Nasopharyngeal swab specimens and should not be used as a sole basis for treatment. Nasal washings and aspirates are unacceptable for Xpert Xpress SARS-CoV-2/FLU/RSV testing.  Fact Sheet for Patients: BloggerCourse.com  Fact Sheet for Healthcare Providers: SeriousBroker.it  This test is not yet approved or cleared by the [place]  FDA and has been authorized for detection  and/or diagnosis of SARS-CoV-2 by FDA under an Emergency Use Authorization (EUA). This EUA will remain in effect (meaning this test can be used) for the duration of the COVID-19 declaration under Section 564(b)(1) of the Act, 21 U.S.C. section 360bbb-3(b)(1), unless the authorization is terminated or revoked.     Resp Syncytial Virus by PCR NEGATIVE NEGATIVE Final    Comment: (NOTE) Fact Sheet for Patients: BloggerCourse.com  Fact Sheet for Healthcare Providers: SeriousBroker.it  This test is not yet approved or cleared by the [place]  FDA and has been authorized for detection and/or diagnosis of SARS-CoV-2 by FDA under an Emergency Use Authorization (EUA). This EUA will remain in effect (meaning this test can be used) for the duration of the COVID-19 declaration under Section 564(b)(1) of the Act, 21 U.S.C. section 360bbb-3(b)(1), unless the authorization is terminated or revoked.  Performed at [facility], [address] [place], [place] [address]          Radiology Studies: DG Chest Howe 1 View Result Date: 05/10/2024 CLINICAL DATA:  Chest pain, dizziness, shortness of breath EXAM: PORTABLE CHEST 1 VIEW COMPARISON:  02/10/2024 FINDINGS: Mild enlargement of the cardiopericardial silhouette noted with indistinct pulmonary vasculature and interstitial accentuation favoring interstitial edema over atypical pneumonia. Large hiatal hernia. Indistinct diaphragms, especially on the left. Cannot exclude airspace opacities along the diaphragms. Layering pleural effusions could have a similar appearance. The patient is rotated to the left on today's radiograph, reducing diagnostic sensitivity and specificity. Atherosclerotic calcification of the aortic arch. IMPRESSION: 1. Mild enlargement of the cardiopericardial silhouette with indistinct pulmonary vasculature and interstitial accentuation favoring interstitial edema over  atypical pneumonia. 2. Indistinct diaphragms, especially on the left. Cannot exclude airspace opacities along the diaphragms. Layering pleural effusions could have a similar appearance. 3. Large hiatal hernia. 4. Aortic Atherosclerosis (ICD10-I70.0). Electronically Signed   By: Ryan Salvage M.D.   On: 05/10/2024 16:48        Scheduled Meds:  aspirin   81 mg Oral Daily   atorvastatin   40 mg Oral Daily   dextromethorphan -guaiFENesin   1 tablet Oral BID   fesoterodine   4 mg Oral Daily   metoprolol  succinate  25 mg Oral Daily   potassium chloride   40 mEq Oral BID   sacubitril -valsartan   1 tablet Oral BID   tamsulosin   0.4 mg Oral QPC supper     LOS: 0 days    Time spent: 55 minutes    Harlene RAYMOND Bowl, DO [facility]  If 7PM-7AM, please contact night-coverage [email or web address] 05/12/2024, 11:39 AM

## 2024-05-13 DIAGNOSIS — I259 Chronic ischemic heart disease, unspecified: Secondary | ICD-10-CM | POA: Diagnosis not present

## 2024-05-13 DIAGNOSIS — I10 Essential (primary) hypertension: Secondary | ICD-10-CM | POA: Diagnosis not present

## 2024-05-13 DIAGNOSIS — I5043 Acute on chronic combined systolic (congestive) and diastolic (congestive) heart failure: Secondary | ICD-10-CM | POA: Diagnosis not present

## 2024-05-13 DIAGNOSIS — N1831 Chronic kidney disease, stage 3a: Secondary | ICD-10-CM | POA: Diagnosis not present

## 2024-05-13 LAB — BASIC METABOLIC PANEL WITH GFR
Anion gap: 6 (ref 5–15)
BUN: 38 mg/dL — ABNORMAL HIGH (ref 8–23)
CO2: 24 mmol/L (ref 22–32)
Calcium: 9.7 mg/dL (ref 8.9–10.3)
Chloride: 105 mmol/L (ref 98–111)
Creatinine, Ser: 1.35 mg/dL — ABNORMAL HIGH (ref 0.61–1.24)
GFR, Estimated: 57 mL/min — ABNORMAL LOW (ref >60)
Glucose, Bld: 137 mg/dL — ABNORMAL HIGH (ref 70–99)
Potassium: 4.9 mmol/L (ref 3.5–5.1)
Sodium: 135 mmol/L (ref 135–145)

## 2024-05-13 MED ORDER — HYDROCODONE-ACETAMINOPHEN 5-325 MG PO TABS
1.0000 | ORAL_TABLET | ORAL | Status: DC | PRN
  Administered 2024-05-14: 1 via ORAL
  Filled 2024-05-13: qty 1

## 2024-05-13 MED ORDER — FE FUM-VIT C-VIT B12-FA 460-60-0.01-1 MG PO CAPS
1.0000 | ORAL_CAPSULE | Freq: Two times a day (BID) | ORAL | Status: DC
  Administered 2024-05-13 – 2024-05-14 (×2): 1 via ORAL
  Filled 2024-05-13 (×2): qty 1

## 2024-05-13 NOTE — Plan of Care (Addendum)

## 2024-05-13 NOTE — Progress Notes (Signed)
 Heart Failure Stewardship Pharmacy Note  PCP: Bernardo Fend, DO PCP-Cardiologist: Evalene Lunger, MD  HPI: Ronnie Mann is a 68 y.o. male with history of staghorn calculus, left hydronephrosis, CHF, HTN, HLD, CAD with NSTEMI s/p DES, CKD-3a, anemia, hiatal hernia, obesity who presented with sudden onset chest pain. Patient has many admissions this year for chest pain, CHF, and renal stones. Underwent R percutaneous nephrolithotomy and ureteral stent placement on 01/22/2024 at [facility] that was complicated by intraoperative pneumothorax requiring right-sided chest tube placement and vasopressors, which infiltrated into his forearm, requiring transfer to [facility]. On admission, BNP was 2316.8, HS-troponin was 44, TSAT of 7, and ferritin of 17. Chest x-ray noted probable interstitial edema.   Pertinent cardiac history: NSTEMI in 04/2023 s/p PCI to proximal LAD and RCA on 05/01/23. Echo at that time showed LVEF 30-35% with grade II diastolic dysfunction, moderate MR. Echo in 09/2023 noted LVEF 25-30% with low normal RV function, and moderate MR. Zio patch 03/2024 showed min HR 38, max HR 176, 5 VT runs (max 5 beats), 392 SVT runs (max 3,3 sec).   Pertinent Lab Values: Creatinine, Ser  Date Value Ref Range Status  05/13/2024 1.35 (H) 0.61 - 1.24 mg/dL Final   BUN  Date Value Ref Range Status  05/13/2024 38 (H) 8 - 23 mg/dL Final  98/69/7974 30 (H) 8 - 27 mg/dL Final   Potassium  Date Value Ref Range Status  05/13/2024 4.9 3.5 - 5.1 mmol/L Final   Sodium  Date Value Ref Range Status  05/13/2024 135 135 - 145 mmol/L Final  12/21/2023 138 134 - 144 mmol/L Final   B Natriuretic Peptide  Date Value Ref Range Status  05/10/2024 2,316.8 (H) 0.0 - 100.0 pg/mL Final    Comment:    Performed at [facility], [address] [place], [place] [address]   Magnesium  Date Value Ref Range Status  05/12/2024 2.1 1.7 - 2.4 mg/dL Final    Comment:    Performed at [facility], [address] [place], [place] [address]   Hgb A1c MFr Bld  Date Value Ref Range Status  10/10/2023 6.1 (H) 4.8 - 5.6 % Final    Comment:    (NOTE) Pre diabetes:          5.7%-6.4%  Diabetes:              >6.4%  Glycemic control for   <7.0% adults with diabetes    TSH  Date Value Ref Range Status  04/28/2023 4.483 0.350 - 4.500 uIU/mL Final    Comment:    Performed by a 3rd Generation assay with a functional sensitivity of <=0.01 uIU/mL. Performed at [facility], [address] [place], [place] [address]     Vital Signs: Temp:  [97.5 F (36.4 C)-98.4 F (36.9 C)] 98.4 F (36.9 C) (06/23 1111) Pulse Rate:  [75-91] 75 (06/23 1111) Cardiac Rhythm: Other (Comment);Bundle branch block (06/23 0737) Resp:  [18-20] 18 (06/23 0408) BP: (92-124)/(44-74) 97/61 (06/23 1111) SpO2:  [94 %-100 %] 100 % (06/23 1111) Weight:  [83.1 kg (183 lb 3.2 oz)] 83.1 kg (183 lb 3.2 oz) (06/23 0413)  Intake/Output Summary (Last 24 hours) at 05/13/2024 1531 Last data filed at 05/13/2024 1500 Gross per 24 hour  Intake 780 ml  Output 1650 ml  Net -870 ml    Current Heart Failure Medications:  Loop diuretic: none Beta-Blocker: metoprolol  succinate 25 mg daily ACEI/ARB/ARNI: Entresto  24-26 mg BID MRA: none  SGLT2i: none Other: none  Prior to admission Heart Failure Medications:  Loop diuretic: furosemide  20 mg daily Beta-Blocker: metoprolol  succinate 25 mg daily ACEI/ARB/ARNI: Entresto  24-26 mg BID MRA: spironolactone  25 mg daily (not filled since 01/2024) SGLT2i: none Other: none  Assessment: 1. Acute on chronic systolic heart failure (LVEF 25-30%)  , due to ICM. NYHA class II-III symptoms.  -Symptoms: Denies orthopnea or dyspnea. Does have mild chest pain and headache. -Volume: Patient initially given 40 mg IV BID of furosemide , stopped due to AKI. Diuretics held x 2 days with no improvement in creatinine. May benefit from resuming oral diuretics tomorrow. K  elevated and supplement stopped this afternoon.   -Hemodynamics: BP lower end of normal. HR 70s. -BB: Continue metoprolol  25 mg daily. -ACEI/ARB/ARNI: Continue Entresto  24-26 mg BID -MRA: Can consider eventually after AKI resolves and K is improved. -SGLT2i: Not a candidate given repeated UTI and urinary stenting. -HA with nitrates, can consider ranolazine vs BB titration for chest pain.   Plan: 1) Medication changes recommended at this time: -None  2) Patient assistance: -Pending  3) Education: - Patient has been educated on current HF medications and potential additions to HF medication regimen - Patient verbalizes understanding that over the next few months, these medication doses may change and more medications may be added to optimize HF regimen - Patient has been educated on basic disease state pathophysiology and goals of therapy  Medication Assistance / Insurance Benefits Check: Does the patient have prescription insurance?    Type of insurance plan:  Does the patient qualify for medication assistance through manufacturers or grants? Pending   Outpatient Pharmacy: Prior to admission outpatient pharmacy: [facility]      Please do not hesitate to reach out with questions or concerns,  Jaun Bash, PharmD, CPP, BCPS Heart Failure Pharmacist  Phone - [phone] 05/13/2024 3:31 PM

## 2024-05-13 NOTE — Plan of Care (Signed)
 Pt is alert and oriented x 4. UP with 1 assist with walker. While walking at bedtime Per pt he had a little bit of dizziness. Dr. Cleatus notified and orthostatic vitals obtained. Pt given tylenol  with midnight vitals. After tylenol  pt became nauseated. Msg provider regarding antiemetics. Provider seen no meds ordered. Reassessed pt and he was asleep.  Problem: Education: Goal: Knowledge of General Education information will improve Description: Including pain rating scale, medication(s)/side effects and non-pharmacologic comfort measures Outcome: Progressing   Problem: Health Behavior/Discharge Planning: Goal: Ability to manage health-related needs will improve Outcome: Progressing   Problem: Clinical Measurements: Goal: Ability to maintain clinical measurements within normal limits will improve Outcome: Progressing Goal: Will remain free from infection Outcome: Progressing Goal: Diagnostic test results will improve Outcome: Progressing Goal: Respiratory complications will improve Outcome: Progressing Goal: Cardiovascular complication will be avoided Outcome: Progressing   Problem: Activity: Goal: Risk for activity intolerance will decrease Outcome: Progressing   Problem: Nutrition: Goal: Adequate nutrition will be maintained Outcome: Progressing   Problem: Coping: Goal: Level of anxiety will decrease Outcome: Progressing   Problem: Elimination: Goal: Will not experience complications related to bowel motility Outcome: Progressing Goal: Will not experience complications related to urinary retention Outcome: Progressing   Problem: Pain Managment: Goal: General experience of comfort will improve and/or be controlled Outcome: Progressing   Problem: Safety: Goal: Ability to remain free from injury will improve Outcome: Progressing   Problem: Skin Integrity: Goal: Risk for impaired skin integrity will decrease Outcome: Progressing   Problem: Education: Goal: Ability to  demonstrate management of disease process will improve Outcome: Progressing Goal: Ability to verbalize understanding of medication therapies will improve Outcome: Progressing   Problem: Activity: Goal: Capacity to carry out activities will improve Outcome: Progressing   Problem: Cardiac: Goal: Ability to achieve and maintain adequate cardiopulmonary perfusion will improve Outcome: Progressing

## 2024-05-13 NOTE — Progress Notes (Signed)
 PROGRESS NOTE    Ronnie Mann  FMW:[id number] DOB: [birth year] DOA: 05/10/2024 PCP: Bernardo Fend, DO   Brief Narrative:    68 y.o. M with sCHF EF 25%, HTN, CAD s/p PCI 04/2023, CKD IIIa baseline 1.2-1.6, pneumothorax last spring followed by PE in Mar 2025 on Eliquis , obesity and hx kidney stones presented with chest pain.  He has been admitted with acute on chronic combined systolic and diastolic CHF exacerbation and has been started on IV diuresis.  Appreciate cardiology evaluation with recommendations to discontinue heparin  and nitroglycerin  drip.  Suspect he may have bronchitis that is likely viral in etiology.  6/23: Vitals and labs stable.  Holding further IV diuresis and will resume home Lasix  on discharge.  Anemia panel with concern of iron  deficiency so started on supplement. PT was also ordered.  Assessment & Plan:   Principal Problem:   Acute on chronic combined systolic and diastolic CHF (congestive heart failure) (HCC) Active Problems:   Recurrent nephrolithiasis   Essential hypertension   Chest pain due to myocardial ischemia   Myocardial injury   HLD (hyperlipidemia)   CAD (coronary artery disease)   Obesity (BMI 30-39.9)   Normocytic anemia   CKD stage 3a, GFR 45-59 ml/min (HCC)   History of pulmonary embolus (PE)   Hypokalemia  Assessment and Plan:   Acute on chronic combined systolic and diastolic CHF (congestive heart failure) (HCC) Presented with SOB on exertion, orthopnea, ankle swelling, edema on CXR with pleural effusions.    Echo last Nov showed EF 25-30% - Have held IV Lasix  on 6/22 due to increasing creatinine and decreased blood pressure -Strict I/Os, daily weights, telemetry -down 6 L and 3 kg since admission -Daily monitoring renal function - Appreciate ongoing cardiology consultation: No further ischemic workup needed and they have signed off  NSVT - Replete potassium and magnesium Bronchitis versus atypical pneumonia - Procalcitonin  negative, no elevation of leukocytosis noted - Likely viral with no need for antibiotics at this time - Mucolytics - Breathing treatments as needed  History of pulmonary embolus (PE) Heparin  drip discontinued and now on Eliquis   CKD stage 3a, GFR 45-59 ml/min (HCC) -Baseline creatinine unclear but perhaps around 1.3 Current creatinine of 1.35 which seems stable  Normocytic anemia Baseline Hgb 8-10 over last year.  Stable within range - Iron  indices are noted to be low and he was given IV iron  - Started on p.o. supplement  Insomnia - Patient cannot sleep despite his home dose of trazodone  50 mg, increased to 100 mg for tonight  Obesity (BMI 30-39.9) BMI 30.2, complicates care, class I obesity  CAD (coronary artery disease) See above    DVT prophylaxis: Eliquis  Code Status: Full Family Communication:  Disposition Plan:  Status is: Observation  Suspect patient can DC in the morning if creatinine stable and blood pressure improved  Consultants:  Cardiology  Subjective: Patient was seen and examined today.  No shortness of breath.  He has not came out of bed yet, ordered PT.  Objective: Vitals:   05/13/24 0809 05/13/24 0907 05/13/24 1111 05/13/24 1535  BP: (!) 113/57 (!) 113/57 97/61 (!) 100/58  Pulse: 80 80 75 70  Resp:      Temp: 97.8 F (36.6 C)  98.4 F (36.9 C) 97.9 F (36.6 C)  TempSrc:      SpO2: 100%  100% 99%  Weight:      Height:        Intake/Output Summary (Last 24 hours) at 05/13/2024 1815 Last  data filed at 05/13/2024 1500 Gross per 24 hour  Intake 780 ml  Output 1100 ml  Net -320 ml   Filed Weights   05/11/24 0500 05/12/24 0500 05/13/24 0413  Weight: 84.1 kg 82.6 kg 83.1 kg    Examination: General.  Well-developed gentleman, in no acute distress. Pulmonary.  Lungs clear bilaterally, normal respiratory effort. CV.  Regular rate and rhythm, no JVD, rub or murmur. Abdomen.  Soft, nontender, nondistended, BS positive. CNS.  Alert and  oriented .  No focal neurologic deficit. Extremities.  No edema,  pulses intact and symmetrical.  Data Reviewed: I have personally reviewed following labs and imaging studies  CBC: Recent Labs  Lab 05/10/24 1322 05/11/24 0429 05/12/24 0512  WBC 4.2 4.8 4.5  HGB 8.7* 9.4* 9.8*  HCT 27.5* 30.0* 32.2*  MCV 90.8 90.6 91.0  PLT 168 163 180   Basic Metabolic Panel: Recent Labs  Lab 05/10/24 1322 05/11/24 0429 05/12/24 0512 05/13/24 0308  NA 137 139 137 135  K 3.4* 3.6 3.8 4.9  CL 105 106 104 105  CO2 23 24 25 24   GLUCOSE 142* 206* 132* 137*  BUN 16 14 29* 38*  CREATININE 0.94 1.01 1.35* 1.35*  CALCIUM  8.7* 8.9 9.3 9.7  MG  --  1.9 2.1  --    GFR: Estimated Creatinine Clearance: 52.4 mL/min (A) (by C-G formula based on SCr of 1.35 mg/dL (H)). Liver Function Tests: Recent Labs  Lab 05/10/24 1322  AST 27  ALT 16  ALKPHOS 85  BILITOT 1.1  PROT 7.3  ALBUMIN  4.0   No results for input(s): LIPASE, AMYLASE in the last 168 hours. No results for input(s): AMMONIA in the last 168 hours. Coagulation Profile: Recent Labs  Lab 05/10/24 1518  INR 1.3*   Cardiac Enzymes: No results for input(s): CKTOTAL, CKMB, CKMBINDEX, TROPONINI in the last 168 hours. BNP (last 3 results) Recent Labs    12/21/23 1421  PROBNP 1,262*   HbA1C: No results for input(s): HGBA1C in the last 72 hours. CBG: No results for input(s): GLUCAP in the last 168 hours. Lipid Profile: No results for input(s): CHOL, HDL, LDLCALC, TRIG, CHOLHDL, LDLDIRECT in the last 72 hours. Thyroid Function Tests: No results for input(s): TSH, T4TOTAL, FREET4, T3FREE, THYROIDAB in the last 72 hours. Anemia Panel: Recent Labs    05/11/24 0429  FERRITIN 17*  TIBC 476*  IRON  35*   Sepsis Labs: Recent Labs  Lab 05/11/24 0429  PROCALCITON <0.10    Recent Results (from the past 240 hours)  Resp panel by RT-PCR (RSV, Flu A&B, Covid) Anterior Nasal Swab     Status:  None   Collection Time: 05/10/24  3:09 PM   Specimen: Anterior Nasal Swab  Result Value Ref Range Status   SARS Coronavirus 2 by RT PCR NEGATIVE NEGATIVE Final    Comment: (NOTE) SARS-CoV-2 target nucleic acids are NOT DETECTED.  The SARS-CoV-2 RNA is generally detectable in upper respiratory specimens during the acute phase of infection. The lowest concentration of SARS-CoV-2 viral copies this assay can detect is 138 copies/mL. A negative result does not preclude SARS-Cov-2 infection and should not be used as the sole basis for treatment or other patient management decisions. A negative result may occur with  improper specimen collection/handling, submission of specimen other than nasopharyngeal swab, presence of viral mutation(s) within the areas targeted by this assay, and inadequate number of viral copies(<138 copies/mL). A negative result must be combined with clinical observations, patient history, and epidemiological information.  The expected result is Negative.  Fact Sheet for Patients:  BloggerCourse.com  Fact Sheet for Healthcare Providers:  SeriousBroker.it  This test is no t yet approved or cleared by the [place]  FDA and  has been authorized for detection and/or diagnosis of SARS-CoV-2 by FDA under an Emergency Use Authorization (EUA). This EUA will remain  in effect (meaning this test can be used) for the duration of the COVID-19 declaration under Section 564(b)(1) of the Act, 21 U.S.C.section 360bbb-3(b)(1), unless the authorization is terminated  or revoked sooner.       Influenza A by PCR NEGATIVE NEGATIVE Final   Influenza B by PCR NEGATIVE NEGATIVE Final    Comment: (NOTE) The Xpert Xpress SARS-CoV-2/FLU/RSV plus assay is intended as an aid in the diagnosis of influenza from Nasopharyngeal swab specimens and should not be used as a sole basis for treatment. Nasal washings and aspirates are unacceptable  for Xpert Xpress SARS-CoV-2/FLU/RSV testing.  Fact Sheet for Patients: BloggerCourse.com  Fact Sheet for Healthcare Providers: SeriousBroker.it  This test is not yet approved or cleared by the [place]  FDA and has been authorized for detection and/or diagnosis of SARS-CoV-2 by FDA under an Emergency Use Authorization (EUA). This EUA will remain in effect (meaning this test can be used) for the duration of the COVID-19 declaration under Section 564(b)(1) of the Act, 21 U.S.C. section 360bbb-3(b)(1), unless the authorization is terminated or revoked.     Resp Syncytial Virus by PCR NEGATIVE NEGATIVE Final    Comment: (NOTE) Fact Sheet for Patients: BloggerCourse.com  Fact Sheet for Healthcare Providers: SeriousBroker.it  This test is not yet approved or cleared by the [place]  FDA and has been authorized for detection and/or diagnosis of SARS-CoV-2 by FDA under an Emergency Use Authorization (EUA). This EUA will remain in effect (meaning this test can be used) for the duration of the COVID-19 declaration under Section 564(b)(1) of the Act, 21 U.S.C. section 360bbb-3(b)(1), unless the authorization is terminated or revoked.  Performed at [facility], [address], [place], [place] [address]      Radiology Studies: No results found.   Scheduled Meds:  aspirin   81 mg Oral Daily   atorvastatin   40 mg Oral Daily   dextromethorphan -guaiFENesin   1 tablet Oral BID   Fe Fum-Vit C-Vit B12-FA  1 capsule Oral BID   fesoterodine   4 mg Oral Daily   metoprolol  succinate  25 mg Oral Daily   sacubitril -valsartan   1 tablet Oral BID   tamsulosin   0.4 mg Oral QPC supper     LOS: 0 days    Time spent: 45 minutes  This record has been created using Conservation officer, historic buildings. Errors have been sought and corrected,but may not always be located. Such  creation errors do not reflect on the standard of care.   Amaryllis Dare, MD [facility]  If 7PM-7AM, please contact night-coverage [email or web address] 05/13/2024, 6:15 PM

## 2024-05-14 ENCOUNTER — Other Ambulatory Visit: Payer: Self-pay

## 2024-05-14 DIAGNOSIS — I25118 Atherosclerotic heart disease of native coronary artery with other forms of angina pectoris: Secondary | ICD-10-CM | POA: Diagnosis not present

## 2024-05-14 DIAGNOSIS — I259 Chronic ischemic heart disease, unspecified: Secondary | ICD-10-CM | POA: Diagnosis not present

## 2024-05-14 DIAGNOSIS — N1831 Chronic kidney disease, stage 3a: Secondary | ICD-10-CM | POA: Diagnosis not present

## 2024-05-14 DIAGNOSIS — E875 Hyperkalemia: Secondary | ICD-10-CM

## 2024-05-14 DIAGNOSIS — I5043 Acute on chronic combined systolic (congestive) and diastolic (congestive) heart failure: Secondary | ICD-10-CM | POA: Diagnosis not present

## 2024-05-14 LAB — BASIC METABOLIC PANEL WITH GFR
Anion gap: 6 (ref 5–15)
BUN: 39 mg/dL — ABNORMAL HIGH (ref 8–23)
CO2: 25 mmol/L (ref 22–32)
Calcium: 9.4 mg/dL (ref 8.9–10.3)
Chloride: 107 mmol/L (ref 98–111)
Creatinine, Ser: 1.23 mg/dL (ref 0.61–1.24)
GFR, Estimated: >60 mL/min (ref >60)
Glucose, Bld: 130 mg/dL — ABNORMAL HIGH (ref 70–99)
Potassium: 5.4 mmol/L — ABNORMAL HIGH (ref 3.5–5.1)
Sodium: 138 mmol/L (ref 135–145)

## 2024-05-14 MED ORDER — SODIUM ZIRCONIUM CYCLOSILICATE 10 G PO PACK
10.0000 g | PACK | Freq: Every day | ORAL | Status: DC
  Administered 2024-05-14: 10 g via ORAL
  Filled 2024-05-14: qty 1

## 2024-05-14 MED ORDER — ASPIRIN 81 MG PO CHEW
81.0000 mg | CHEWABLE_TABLET | Freq: Every day | ORAL | 2 refills | Status: AC
Start: 2024-05-15 — End: ?
  Filled 2024-05-14: qty 90, 90d supply, fill #0
  Filled 2024-08-21: qty 90, 90d supply, fill #1

## 2024-05-14 MED ORDER — FE FUM-VIT C-VIT B12-FA 460-60-0.01-1 MG PO CAPS
1.0000 | ORAL_CAPSULE | Freq: Two times a day (BID) | ORAL | 0 refills | Status: DC
Start: 2024-05-14 — End: 2024-09-19
  Filled 2024-05-14: qty 180, 90d supply, fill #0
  Filled 2024-05-14: qty 100, 50d supply, fill #0

## 2024-05-14 MED ORDER — SODIUM ZIRCONIUM CYCLOSILICATE 10 G PO PACK
10.0000 g | PACK | Freq: Every day | ORAL | 0 refills | Status: AC
  Filled 2024-05-14: qty 2, 2d supply, fill #0

## 2024-05-14 MED ORDER — DM-GUAIFENESIN ER 30-600 MG PO TB12
1.0000 | ORAL_TABLET | Freq: Two times a day (BID) | ORAL | 0 refills | Status: AC | PRN
  Filled 2024-05-14: qty 20, 10d supply, fill #0

## 2024-05-14 NOTE — Progress Notes (Signed)
 Heart Failure Stewardship Pharmacy Note  PCP: Bernardo Fend, DO PCP-Cardiologist: Evalene Lunger, MD  HPI: Ronnie Mann is a 68 y.o. male with history of staghorn calculus, left hydronephrosis, CHF, HTN, HLD, CAD with NSTEMI s/p DES, CKD-3a, anemia, hiatal hernia, obesity who presented with sudden onset chest pain. Patient has many admissions this year for chest pain, CHF, and renal stones. Underwent R percutaneous nephrolithotomy and ureteral stent placement on 01/22/2024 at [facility] that was complicated by intraoperative pneumothorax requiring right-sided chest tube placement and vasopressors, which infiltrated into his forearm, requiring transfer to [facility]. On admission, BNP was 2316.8, HS-troponin was 44, TSAT of 7, and ferritin of 17. Chest x-ray noted probable interstitial edema.   Pertinent cardiac history: NSTEMI in 04/2023 s/p PCI to proximal LAD and RCA on 05/01/23. Echo at that time showed LVEF 30-35% with grade II diastolic dysfunction, moderate MR. Echo in 09/2023 noted LVEF 25-30% with low normal RV function, and moderate MR. Zio patch 03/2024 showed min HR 38, max HR 176, 5 VT runs (max 5 beats), 392 SVT runs (max 3,3 sec).   Pertinent Lab Values: Creatinine, Ser  Date Value Ref Range Status  05/14/2024 1.23 0.61 - 1.24 mg/dL Final   BUN  Date Value Ref Range Status  05/14/2024 39 (H) 8 - 23 mg/dL Final  98/69/7974 30 (H) 8 - 27 mg/dL Final   Potassium  Date Value Ref Range Status  05/14/2024 5.4 (H) 3.5 - 5.1 mmol/L Final   Sodium  Date Value Ref Range Status  05/14/2024 138 135 - 145 mmol/L Final  12/21/2023 138 134 - 144 mmol/L Final   B Natriuretic Peptide  Date Value Ref Range Status  05/10/2024 2,316.8 (H) 0.0 - 100.0 pg/mL Final    Comment:    Performed at [facility], [address] [place], [place] [address]   Magnesium  Date Value Ref Range Status  05/12/2024 2.1 1.7 - 2.4 mg/dL Final    Comment:    Performed at [facility], [address] [place], [place] [address]   Hgb A1c MFr Bld  Date Value Ref Range Status  10/10/2023 6.1 (H) 4.8 - 5.6 % Final    Comment:    (NOTE) Pre diabetes:          5.7%-6.4%  Diabetes:              >6.4%  Glycemic control for   <7.0% adults with diabetes    TSH  Date Value Ref Range Status  04/28/2023 4.483 0.350 - 4.500 uIU/mL Final    Comment:    Performed by a 3rd Generation assay with a functional sensitivity of <=0.01 uIU/mL. Performed at [facility], [address] [place], [place] [address]     Vital Signs: Temp:  [97.4 F (36.3 C)-98.4 F (36.9 C)] 98.1 F (36.7 C) (06/24 0745) Pulse Rate:  [65-75] 74 (06/24 0745) Cardiac Rhythm: Normal sinus rhythm;Bundle branch block (06/24 0702) Resp:  [16-20] 16 (06/24 0745) BP: (97-117)/(56-63) 117/60 (06/24 0745) SpO2:  [96 %-100 %] 99 % (06/24 0745) Weight:  [85.3 kg (188 lb 0.8 oz)] 85.3 kg (188 lb 0.8 oz) (06/24 0500)  Intake/Output Summary (Last 24 hours) at 05/14/2024 1015 Last data filed at 05/13/2024 2117 Gross per 24 hour  Intake 360 ml  Output 400 ml  Net -40 ml    Current Heart Failure Medications:  Loop diuretic: none Beta-Blocker: metoprolol  succinate 25 mg daily ACEI/ARB/ARNI: Entresto  24-26 mg BID MRA:  none SGLT2i: none Other: none  Prior to admission Heart Failure Medications:  Loop diuretic: furosemide  20 mg daily Beta-Blocker: metoprolol  succinate 25 mg daily ACEI/ARB/ARNI: Entresto  24-26 mg BID MRA: spironolactone  25 mg daily (not filled since 01/2024) SGLT2i: none Other: none  Assessment: 1. Acute on chronic systolic heart failure (LVEF 25-30%)  , due to ICM. NYHA class II-III symptoms.  -Symptoms: Denies orthopnea or dyspnea. Still has mild headache. -Volume: Patient initially given 40 mg IV BID of furosemide , stopped due to AKI. Diuretics held x 3 days with improvement in creatinine. May benefit from resuming oral diuretics tomorrow.  -Hemodynamics: BP  lower end of normal. HR 70s. -BB: Continue metoprolol  25 mg daily. -ACEI/ARB/ARNI: Continue Entresto  24-26 mg BID -MRA: Not a candidate at this time. Can consider eventually after AKI resolves and K is improved. Hyperkalemic today due to supplementation in the absence of diuresis. -SGLT2i: Not a candidate given repeated UTI and urinary stenting. -HA with nitrates, can consider ranolazine vs BB titration for chest pain.  -Ferritin and TSAT are low meeting AHA criteria for IV iron  replacement in CHF. Required replacement per Ganzoni equation is 1032 mg.  Plan: 1) Medication changes recommended at this time: -Outpatient referral for IV iron  placed.  2) Patient assistance: -Pending  3) Education: - Patient has been educated on current HF medications and potential additions to HF medication regimen - Patient verbalizes understanding that over the next few months, these medication doses may change and more medications may be added to optimize HF regimen - Patient has been educated on basic disease state pathophysiology and goals of therapy  Medication Assistance / Insurance Benefits Check: Does the patient have prescription insurance?    Type of insurance plan:  Does the patient qualify for medication assistance through manufacturers or grants? Pending   Outpatient Pharmacy: Prior to admission outpatient pharmacy: [facility]      Please do not hesitate to reach out with questions or concerns,  Jaun Bash, PharmD, CPP, BCPS Heart Failure Pharmacist  Phone - [phone] 05/14/2024 10:15 AM

## 2024-05-14 NOTE — Evaluation (Signed)
 Physical Therapy Evaluation Patient Details Name: Ronnie Mann MRN: [id number] DOB: 08-29-1956 Today's Date: 05/14/2024  History of Present Illness  Pt admitted for Acute on chronic CHF. History includes CHF with EF at 25%, HTN, CAD s/p PCI, pneumothorax with PE in March of 2025.  Clinical Impression  Pt is a pleasant 68 year old male who was admitted for acute/chronic CHF. Pt demonstrates all bed mobility/transfers/ambulation at baseline level. Pt does not require any further PT needs at this time. Pt will be dc in house and does not require follow up. RN aware. Will dc current orders.       If plan is discharge home, recommend the following: A little help with walking and/or transfers   Can travel by private vehicle        Equipment Recommendations None recommended by PT  Recommendations for Other Services       Functional Status Assessment Patient has not had a recent decline in their functional status     Precautions / Restrictions Precautions Precautions: Fall Recall of Precautions/Restrictions: Intact Restrictions Weight Bearing Restrictions Per Provider Order: No      Mobility  Bed Mobility Overal bed mobility: Modified Independent             General bed mobility comments: safe technique    Transfers Overall transfer level: Modified independent Equipment used: None               General transfer comment: safe technique    Ambulation/Gait Ambulation/Gait assistance: Modified independent (Device/Increase time) Gait Distance (Feet): 300 Feet Assistive device: Rolling walker (2 wheels) Gait Pattern/deviations: Step-through pattern       General Gait Details: ambulated around RN station with safe technique. No reports of dizziness noted  Stairs            Wheelchair Mobility     Tilt Bed    Modified Rankin (Stroke Patients Only)       Balance Overall balance assessment: Modified Independent                                            Pertinent Vitals/Pain Pain Assessment Pain Assessment: No/denies pain    Home Living Family/patient expects to be discharged to:: Private residence Living Arrangements: Children Available Help at Discharge: Family;Available PRN/intermittently Type of Home: House Home Access: Stairs to enter   [employer] of Steps: 2   Home Layout: One level Home Equipment: Cane - single Librarian, academic (2 wheels)      Prior Function Prior Level of Function : Independent/Modified Independent             Mobility Comments: reports he's been using RW for all mobility ADLs Comments: indep     Extremity/Trunk Assessment   Upper Extremity Assessment Upper Extremity Assessment: Overall WFL for tasks assessed    Lower Extremity Assessment Lower Extremity Assessment: Overall WFL for tasks assessed       Communication   Communication Communication: No apparent difficulties    Cognition Arousal: Alert Behavior During Therapy: WFL for tasks assessed/performed   PT - Cognitive impairments: No apparent impairments                       PT - Cognition Comments: pleasant and agreeable to session Following commands: Intact       Cueing Cueing Techniques: Verbal cues  General Comments      Exercises     Assessment/Plan    PT Assessment Patient does not need any further PT services  PT Problem List         PT Treatment Interventions      PT Goals (Current goals can be found in the Care Plan section)  Acute Rehab PT Goals Patient Stated Goal: to go home PT Goal Formulation: All assessment and education complete, DC therapy Time For Goal Achievement: 05/14/24 Potential to Achieve Goals: Good    Frequency       Co-evaluation               AM-PAC PT 6 Clicks Mobility  Outcome Measure Help needed turning from your back to your side while in a flat bed without using bedrails?: None Help needed moving from  lying on your back to sitting on the side of a flat bed without using bedrails?: None Help needed moving to and from a bed to a chair (including a wheelchair)?: None Help needed standing up from a chair using your arms (e.g., wheelchair or bedside chair)?: None Help needed to walk in hospital room?: None Help needed climbing 3-5 steps with a railing? : None 6 Click Score: 24    End of Session Equipment Utilized During Treatment: Gait belt Activity Tolerance: Patient tolerated treatment well Patient left: in bed Nurse Communication: Mobility status PT Visit Diagnosis: Difficulty in walking, not elsewhere classified (R26.2)    Time: 9041-8991 PT Time Calculation (min) (ACUTE ONLY): 10 min   Charges:   PT Evaluation $PT Eval Low Complexity: 1 Low   PT General Charges $$ ACUTE PT VISIT: 1 Visit         Corean Dade, PT, DPT, GCS [phone]   Christianjames Soule 05/14/2024, 11:15 AM

## 2024-05-14 NOTE — Progress Notes (Signed)
 Heart Failure Navigator Progress Note  Assessed for Heart & Vascular TOC clinic readiness.  Patient does not meet criteria due to current Advanced Heart Failure Team patient. He already has a previously scheduled appointment for 05/17/24 @ 12:00. Navigator will add to his AVS for hospital discharge appointments.   Navigator will sign off at this time.  Charmaine Pines, RN, BSN [facility] Heart Failure Navigator Secure Chat Only

## 2024-05-14 NOTE — TOC Transition Note (Signed)
 Transition of Care [facility]) - Discharge Note   Patient Details  Name: Ronnie Mann MRN: [id number] Date of Birth: [birth year]  Transition of [facility]) CM/SW Contact:  Maddix Heinz C Diella Gillingham, RN Phone Number: 05/14/2024, 11:45 AM   Clinical Narrative:    TOC signing off.           Patient Goals and CMS Choice            Discharge Placement                       Discharge Plan and Services Additional resources added to the After Visit Summary for                                       Social Drivers of Health (SDOH) Interventions SDOH Screenings   Food Insecurity: No Food Insecurity (05/10/2024)  Housing: Low Risk  (05/10/2024)  Transportation Needs: No Transportation Needs (05/10/2024)  Utilities: At Risk (05/10/2024)  Alcohol Screen: Low Risk  (11/27/2023)  Depression (PHQ2-9): Low Risk  (02/22/2024)  Financial Resource Strain: Low Risk  (01/23/2024)   Received from [facility]  Social Connections: Socially Isolated (05/10/2024)  Tobacco Use: Medium Risk (05/10/2024)     Readmission Risk Interventions    11/06/2023   12:27 PM  Readmission Risk Prevention Plan  Transportation Screening Complete  PCP or Specialist Appt within 3-5 Days Complete  HRI or Home Care Consult Complete  Social Work Consult for Recovery Care Planning/Counseling Complete  Palliative Care Screening Not Applicable

## 2024-05-14 NOTE — Discharge Summary (Signed)
 Physician Discharge Summary   Patient: Ronnie Mann MRN: [id number] DOB: 04-Sep-1956  Admit date:     05/10/2024  Discharge date: 05/14/24  Discharge Physician: Amaryllis Dare   PCP: Bernardo Fend, DO   Recommendations at discharge:  Please obtain CBC and BMP on follow-up Patient had mild hyperkalemia and was given Lokelma for 2 more days, he will restart his home lasix  on discharge, it was held initially due to worsening creatinine. Follow-up with heart failure clinic Follow-up with PCP  Discharge Diagnoses: Principal Problem:   Acute on chronic combined systolic and diastolic CHF (congestive heart failure) (HCC) Active Problems:   Recurrent nephrolithiasis   Essential hypertension   Chest pain due to myocardial ischemia   Myocardial injury   HLD (hyperlipidemia)   CAD (coronary artery disease)   Obesity (BMI 30-39.9)   Normocytic anemia   CKD stage 3a, GFR 45-59 ml/min (HCC)   History of pulmonary embolus (PE)   Hypokalemia   Hyperkalemia   Hospital Course: 68 y.o. M with sCHF EF 25%, HTN, CAD s/p PCI 04/2023, CKD IIIa baseline 1.2-1.6, pneumothorax last spring followed by PE in Mar 2025 on Eliquis , obesity and hx kidney stones presented with chest pain.  Pain started few days ago, nonexertional, worse with lying flat also abdominal bloating. Had a sensation that he was gasping with laying flat, better with standing and walking around. Couldn't sleep at all last night, so he came to the ER.  In the ER, CXR showed edema.  ECG showed no ST changes. Troponin 40s, so he was started on heparin  gtt and IV Lasix  and hospitalist service was asked to evaluate for CHF and possible ACS.  He was admitted for acute on chronic HFrEF, initially started on IV diuretics which was later discontinued due to increasing creatinine.  Heart failure team also evaluated him.  His symptoms improved.  His home Entresto  was also held for couple of days due to softer blood pressure and later resumed  once blood pressure improved.  Patient will resume his low-dose home Lasix  on discharge.  Patient with some variation in potassium, initially hypokalemia and on the day of discharge developed mild hyperkalemia.  His Lasix  was held for the past couple of days before discharge.  He was given Lokelma and asked to have a close follow-up with his providers to recheck his levels.  He will resume his home Lasix  on discharge which should be helping with hyperkalemic effect of Entresto .  Patient had an history of PE but has stopped taking Eliquis  so it was discontinued.  He will continue on current medications and need to have a close follow-up with his providers for further management.   Consultants: Cardiology Procedures performed: None Disposition: Home Diet recommendation:  Discharge Diet Orders (From admission, onward)     Start     Ordered   05/14/24 0000  Diet - low sodium heart healthy        05/14/24 1045           Cardiac diet DISCHARGE MEDICATION: Allergies as of 05/14/2024       Reactions   Losartan  Other (See Comments)   Chest pain        Medication List     STOP taking these medications    Eliquis  5 MG Tabs tablet Generic drug: apixaban    valsartan  40 MG tablet Commonly known as: Diovan        TAKE these medications    albuterol  108 (90 Base) MCG/ACT inhaler Commonly known as: VENTOLIN   HFA Inhale 2 puffs into the lungs every 6 (six) hours as needed for wheezing or shortness of breath.   aspirin  81 MG chewable tablet Chew 1 tablet (81 mg total) by mouth daily. Start taking on: May 15, 2024   atorvastatin  40 MG tablet Commonly known as: LIPITOR Take 1 tablet (40 mg total) by mouth daily.   dextromethorphan -guaiFENesin  30-600 MG 12hr tablet Commonly known as: MUCINEX  DM Take 1 tablet by mouth 2 (two) times daily as needed for cough.   Entresto  24-26 MG Generic drug: sacubitril -valsartan  Take 1 tablet by mouth 2 (two) times daily.   Fe Fum-Vit  C-Vit B12-FA Caps capsule Commonly known as: TRIGELS-F FORTE Take 1 capsule by mouth 2 (two) times daily.   furosemide  20 MG tablet Commonly known as: Lasix  Take 2 tablets (40 mg total) by mouth daily. What changed: how much to take   metoprolol  succinate 25 MG 24 hr tablet Commonly known as: TOPROL -XL Take 1 tablet (25 mg total) by mouth daily.   nitroGLYCERIN  0.4 MG SL tablet Commonly known as: NITROSTAT  Place 1 tablet (0.4 mg total) under the tongue every 5 (five) minutes as needed for chest pain.   ondansetron  4 MG disintegrating tablet Commonly known as: ZOFRAN -ODT Take 1 tablet (4 mg total) by mouth every 8 (eight) hours as needed.   sodium zirconium cyclosilicate 10 g Pack packet Commonly known as: LOKELMA Take 10 g by mouth daily for 2 days. Start taking on: May 15, 2024   solifenacin 10 MG tablet Commonly known as: VESICARE Take 10 mg by mouth daily.   tamsulosin  0.4 MG Caps capsule Commonly known as: FLOMAX  Take 1 capsule (0.4 mg total) by mouth daily after supper.   traZODone  50 MG tablet Commonly known as: DESYREL  Take 1 tablet (50 mg total) by mouth at bedtime as needed for sleep.        Follow-up Information     [facility]. Go on 05/17/2024.   Specialty: Cardiology Why: [facility] Advanced Heart Failure appt previously scheduled 05/17/24 @ 12:00 Please bring all medications to follow-up appointment [facility], [address] , Second [address] Free Valet Parking at the [employer] information: [address] [place]  [address] [phone]        Bernardo Fend, DO. Schedule an appointment as soon as possible for a visit in 1 week(s).   Specialty: Internal Medicine Contact information: [address] [place] [address] [phone]                Discharge Exam: Fredricka Weights   05/12/24 0500 05/13/24 0413 05/14/24 0500  Weight: 82.6 kg  83.1 kg 85.3 kg   General.  Well-developed gentleman, in no acute distress. Pulmonary.  Lungs clear bilaterally, normal respiratory effort. CV.  Regular rate and rhythm, no JVD, rub or murmur. Abdomen.  Soft, nontender, nondistended, BS positive. CNS.  Alert and oriented .  No focal neurologic deficit. Extremities.  No edema,  pulses intact and symmetrical. Psychiatry.  Judgment and insight appears normal.   Condition at discharge: stable  The results of significant diagnostics from this hospitalization (including imaging, microbiology, ancillary and laboratory) are listed below for reference.   Imaging Studies: DG Chest Port 1 View Result Date: 05/10/2024 CLINICAL DATA:  Chest pain, dizziness, shortness of breath EXAM: PORTABLE CHEST 1 VIEW COMPARISON:  02/10/2024 FINDINGS: Mild enlargement of the cardiopericardial silhouette noted with indistinct pulmonary vasculature and interstitial accentuation favoring interstitial edema over  atypical pneumonia. Large hiatal hernia. Indistinct diaphragms, especially on the left. Cannot exclude airspace opacities along the diaphragms. Layering pleural effusions could have a similar appearance. The patient is rotated to the left on today's radiograph, reducing diagnostic sensitivity and specificity. Atherosclerotic calcification of the aortic arch. IMPRESSION: 1. Mild enlargement of the cardiopericardial silhouette with indistinct pulmonary vasculature and interstitial accentuation favoring interstitial edema over atypical pneumonia. 2. Indistinct diaphragms, especially on the left. Cannot exclude airspace opacities along the diaphragms. Layering pleural effusions could have a similar appearance. 3. Large hiatal hernia. 4. Aortic Atherosclerosis (ICD10-I70.0). Electronically Signed   By: Ryan Salvage M.D.   On: 05/10/2024 16:48    Microbiology: Results for orders placed or performed during the hospital encounter of 05/10/24  Resp panel by RT-PCR (RSV,  Flu A&B, Covid) Anterior Nasal Swab     Status: None   Collection Time: 05/10/24  3:09 PM   Specimen: Anterior Nasal Swab  Result Value Ref Range Status   SARS Coronavirus 2 by RT PCR NEGATIVE NEGATIVE Final    Comment: (NOTE) SARS-CoV-2 target nucleic acids are NOT DETECTED.  The SARS-CoV-2 RNA is generally detectable in upper respiratory specimens during the acute phase of infection. The lowest concentration of SARS-CoV-2 viral copies this assay can detect is 138 copies/mL. A negative result does not preclude SARS-Cov-2 infection and should not be used as the sole basis for treatment or other patient management decisions. A negative result may occur with  improper specimen collection/handling, submission of specimen other than nasopharyngeal swab, presence of viral mutation(s) within the areas targeted by this assay, and inadequate number of viral copies(<138 copies/mL). A negative result must be combined with clinical observations, patient history, and epidemiological information. The expected result is Negative.  Fact Sheet for Patients:  BloggerCourse.com  Fact Sheet for Healthcare Providers:  SeriousBroker.it  This test is no t yet approved or cleared by the [place]  FDA and  has been authorized for detection and/or diagnosis of SARS-CoV-2 by FDA under an Emergency Use Authorization (EUA). This EUA will remain  in effect (meaning this test can be used) for the duration of the COVID-19 declaration under Section 564(b)(1) of the Act, 21 U.S.C.section 360bbb-3(b)(1), unless the authorization is terminated  or revoked sooner.       Influenza A by PCR NEGATIVE NEGATIVE Final   Influenza B by PCR NEGATIVE NEGATIVE Final    Comment: (NOTE) The Xpert Xpress SARS-CoV-2/FLU/RSV plus assay is intended as an aid in the diagnosis of influenza from Nasopharyngeal swab specimens and should not be used as a sole basis for  treatment. Nasal washings and aspirates are unacceptable for Xpert Xpress SARS-CoV-2/FLU/RSV testing.  Fact Sheet for Patients: BloggerCourse.com  Fact Sheet for Healthcare Providers: SeriousBroker.it  This test is not yet approved or cleared by the [place]  FDA and has been authorized for detection and/or diagnosis of SARS-CoV-2 by FDA under an Emergency Use Authorization (EUA). This EUA will remain in effect (meaning this test can be used) for the duration of the COVID-19 declaration under Section 564(b)(1) of the Act, 21 U.S.C. section 360bbb-3(b)(1), unless the authorization is terminated or revoked.     Resp Syncytial Virus by PCR NEGATIVE NEGATIVE Final    Comment: (NOTE) Fact Sheet for Patients: BloggerCourse.com  Fact Sheet for Healthcare Providers: SeriousBroker.it  This test is not yet approved or cleared by the [place]  FDA and has been authorized for detection and/or diagnosis of SARS-CoV-2 by FDA under an Emergency Use Authorization (EUA). This EUA  will remain in effect (meaning this test can be used) for the duration of the COVID-19 declaration under Section 564(b)(1) of the Act, 21 U.S.C. section 360bbb-3(b)(1), unless the authorization is terminated or revoked.  Performed at [facility], [address] [place], [place] [address]     Labs: CBC: Recent Labs  Lab 05/10/24 1322 05/11/24 0429 05/12/24 0512  WBC 4.2 4.8 4.5  HGB 8.7* 9.4* 9.8*  HCT 27.5* 30.0* 32.2*  MCV 90.8 90.6 91.0  PLT 168 163 180   Basic Metabolic Panel: Recent Labs  Lab 05/10/24 1322 05/11/24 0429 05/12/24 0512 05/13/24 0308 05/14/24 0456  NA 137 139 137 135 138  K 3.4* 3.6 3.8 4.9 5.4*  CL 105 106 104 105 107  CO2 23 24 25 24 25   GLUCOSE 142* 206* 132* 137* 130*  BUN 16 14 29* 38* 39*  CREATININE 0.94 1.01 1.35* 1.35* 1.23  CALCIUM  8.7* 8.9 9.3  9.7 9.4  MG  --  1.9 2.1  --   --    Liver Function Tests: Recent Labs  Lab 05/10/24 1322  AST 27  ALT 16  ALKPHOS 85  BILITOT 1.1  PROT 7.3  ALBUMIN  4.0   CBG: No results for input(s): GLUCAP in the last 168 hours.  Discharge time spent: greater than 30 minutes.  This record has been created using Conservation officer, historic buildings. Errors have been sought and corrected,but may not always be located. Such creation errors do not reflect on the standard of care.   Signed: Amaryllis Dare, MD [facility] 05/14/2024

## 2024-05-15 ENCOUNTER — Other Ambulatory Visit (HOSPITAL_COMMUNITY): Payer: Self-pay | Admitting: Family

## 2024-05-15 ENCOUNTER — Other Ambulatory Visit: Payer: Self-pay

## 2024-05-15 ENCOUNTER — Telehealth (HOSPITAL_COMMUNITY): Payer: Self-pay | Admitting: Family

## 2024-05-15 DIAGNOSIS — I5021 Acute systolic (congestive) heart failure: Secondary | ICD-10-CM

## 2024-05-15 DIAGNOSIS — I5022 Chronic systolic (congestive) heart failure: Secondary | ICD-10-CM

## 2024-05-15 NOTE — Telephone Encounter (Signed)
 Patient referred to infusion pharmacy team for ambulatory infusion of IV iron .  Insurance - [employer]  Site of care - Site of care: [facility] INF Dx code - I50.43/ IV Iron  Therapy - Venofer  300 mg IV x 3  Infusion appointments - Scheduling team will schedule patient as soon as possible.   Aadvik Roker D. Neveen Daponte, PharmD

## 2024-05-16 ENCOUNTER — Telehealth: Payer: Self-pay | Admitting: Family

## 2024-05-16 NOTE — Telephone Encounter (Signed)
 Called to confirm/remind patient of their appointment at the [facility] on 05/17/24.   Appointment:   [x] Confirmed  [] Left mess   [] No answer/No voice mail  [] VM Full/unable to leave message  [] Phone not in service  Patient reminded to bring all medications and/or complete list.  Confirmed patient has transportation. Gave directions, instructed to utilize valet parking.

## 2024-05-16 NOTE — Progress Notes (Signed)
 Advanced [facility] Failure Clinic Note    PCP: Bernardo Fend, DO (last seen 04/25) Primary Cardiologist: Timothy Gollan, MD (saw during 06/25 admission)  Chief Complaint: shortness of breath   HPI:  Ronnie Mann is a 68 y/o male with a history of NSTEMI, s/p PCI to proximal LAD and RCA on 05/01/23, HFrEF (EF 30 to 35%), previous tobacco/ alcohol use, HTN, CKD, UTI, hyperlipidemia, kidney stones since the age of 28, right uretal stone s/p stent 10/12/23, bilateral nephrolithiasis and pulmonary embolus (03/25).   Admitted 04/28/23 due to new onset of substernal vice like substernal chest pain that radiated to his left arm. He reports ongoing shortness of breath associated with this. He also was awoken from sleep with this last night and had broken out into a cold sweat. Elevated lactic acid and elevated WBC at 14.4. 1 L of IVF given. Initial troponin was 54 rose to 234. 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 Developed flash pulmonary edema during left heart cath 06/10 and was treated with nitroglycerin  drip. Diuretics held. Status post successful balloon angioplasty to the proximal LAD and drug-eluting stent placement to the proximal right coronary artery. Treated for pneumonia with antibiotics. Was in the ED 05/13/23 due to bilateral flank and suprapubic pain. CT renal was stable. Admitted 05/16/23 due to shortness of breath, bilateral flank pain radiating towards lower abdomen, more on right than left, dysuria and gross hematuria. Patient deferred urology stent placement. Able to void after foley removed. Brilinta  was switched to Plavix , aspirin  discontinued in the setting of hematuria by cardiologist    Admitted 10/10/23 due to shortness of breath, hematuria and flank pain. Started on IV lasix  for HF exacerbation. Cardiology consulted and feel respiratory symptoms more related to bronchitis. Lasix  held and solu-medrol  provided. Respiratory panel negative.  Urology consulted and stent placed. Losartan  was changed to entresto .   Admitted 11/04/23 due to right flank pain. RUQ sono showed cholelithiasis with positive sonographic Murphy's borderline thickening of gallbladder wall at fundus suspicious for acute cholecystitis.  CT renal stone study showed right ureteral stent in position, persistent calculi in right ureter, pelvis and proximal mid left ureter.  Surgery team evaluated the patient in the ED, advised HIDA scan which ruled out acute cholecystitis. Entresto , lasix  and spironolactone  held due to low BP. To follow up with [facility]rology early January 2025.  Has had 2 ED visits 01/25 due to pain from his kidney stone  Had PCNL and ureteral stent placed 01/22/24 complicated by intraoperative pneumothorax requiring right-sided chest tube placement with Interventional Radiology on 01/22/2024 at [facility]. He also received pressors intraoperatively for which IV was infiltrated with concern for right forearm necrosis. Had episode of sudden chest pain radiating to the left arm. The patient had an equivocal EKG with possible ST elevations and Troponins in the 700s. Cardiology service was consulted for their recommendations, and there was no acute intervention for cath lab. Troponins peaked later that night to the 2000s and later downtrended to 1800s. On POD4 the pigtail chest tube was removed with stable CXR, post-pull chest XR was stable.     Admitted 01/30/24 with sudden onset of chest pain localized in the left side chest radiate to the left arm-8:30 AM yesterday. He took some nitroglycerin , and slept. He had another episode of chest pain today at 9:30 AM, was described as a sharp, persistent. 10/10 in severity. Radiate to the left arm. Worsening shortness of breath where he has to sleep in  the recliner. Troponins only mildly elevated and stable. Cardiology consulted, does not think this is ACS. VQ scan pursued which shows acute pulmonary embolus  (segmental in LLL and subsegmental in RLL. Heparin  drip started. Urology consulted. Started on apixaban .   Was in the ER 02/05/24 due to gross hematuria. CT showed no new ureteral stones or any evidence of clots in the bladder. Urology consulted and said to remove the stent. Stent removed with immediate improvement of symptoms.   Most recent ED visit 02/14/24 with right chest pain/ flank pain. CT shows mild area of uncomplicated colitis in the ascending colon.   He came to office 02/19/24 to have staff place zio and he c/o chest pain. BP was low. Encouraged ER but patient refused. Spironolactone  was stopped.   Admitted 05/10/24 with shortness of breath. Has associated fatigue, chest pain (usually at night), rhinorrhea, cough, abdominal distention on right lower side. Has gained 7 pounds since discharge 3 days ago   He presents today for a HF f/u visit with a chief complaint of shortness of breath with little exertion. Has associated fatigue, dizziness, right sided abdominal swelling and intermittent chest pain. Denies palpitations, abdominal distention, pedal edema. He says that he has gained 7 pounds since discharge 3 days ago. Reports some confusion with his medications with his frequent admissions.   Previous cardiac studies:   Echo 04/30/23: EF 30-35% along with mild LVH and Grade II DD and moderate Ronnie.  Echo 10/13/23: EF 25-30% with moderate LAE, moderate Ronnie  LHC 05/01/23:   Prox LAD to Mid LAD lesion is 99% stenosed.   Mid LAD lesion is 60% stenosed.   1st Diag lesion is 70% stenosed.   Prox RCA lesion is 95% stenosed.   Mid RCA lesion is 30% stenosed.   RPDA lesion is 60% stenosed.   Dist LAD lesion is 99% stenosed.   A drug-eluting stent was successfully placed using a STENT ONYX FRONTIER 4.0X15.   Balloon angioplasty was performed using a BALLN [place] EUPHORA RX 2.75X20.   Post intervention, there is a 20% residual stenosis.   Post intervention, there is a 0% residual stenosis.   There is  severe left ventricular systolic dysfunction.   LV end diastolic pressure is severely elevated.   There is moderate (3+) mitral regurgitation.  1.  Severe two-vessel coronary artery disease with subtotal occlusion of the proximal LAD, diffuse moderate mid LAD disease and severe distal LAD disease.  In addition, there is 95% in the proximal right coronary artery with faint right to left collaterals to the LAD.  The left circumflex has mild nonobstructive disease and OM 3 is large and reaches the apex. 2.  Severely reduced LV systolic function with an EF of 25%.  Severely elevated left ventricular end-diastolic pressure at 34 mmHg. 3.  Successful balloon angioplasty to the proximal LAD and drug-eluting stent placement to the proximal right coronary artery.   ROS: All systems negative except as listed in HPI, PMH and Problem List.  SH:  Social History   Socioeconomic History   Marital status: Divorced    Spouse name: Not on file   Number of children: Not on file   Years of education: Not on file   Highest education level: 10th grade  Occupational History   Not on file  Tobacco Use   Smoking status: Former    Types: Cigarettes    Passive exposure: Never   Smokeless tobacco: Never  Vaping Use   Vaping status: Never Used  Substance and Sexual Activity   Alcohol use: Not Currently    Comment: former moderate drinker   Drug use: Yes    Types: Marijuana   Sexual activity: Not on file  Other Topics Concern   Not on file  Social History Narrative   Not on file   Social Drivers of Health   Financial Resource Strain: Low Risk  (01/23/2024)   Received from [facility]   Overall Financial Resource Strain (CARDIA)    Difficulty of Paying Living Expenses: Not hard at all  Food Insecurity: No Food Insecurity (05/10/2024)   Hunger Vital Sign    Worried About Running Out of Food in the Last Year: Never true    Ran Out of Food in the Last Year: Never true  Transportation Needs: No  Transportation Needs (05/10/2024)   PRAPARE - Administrator, Civil Service (Medical): No    Lack of Transportation (Non-Medical): No  Physical Activity: Not on file  Stress: Not on file  Social Connections: Socially Isolated (05/10/2024)   Social Connection and Isolation Panel    Frequency of Communication with Friends and Family: Twice a week    Frequency of Social Gatherings with Friends and Family: Never    Attends Religious Services: Never    Database administrator or Organizations: No    Attends Banker Meetings: Never    Marital Status: Divorced  Catering manager Violence: Not At Risk (05/10/2024)   Humiliation, Afraid, Rape, and Kick questionnaire    Fear of Current or Ex-Partner: No    Emotionally Abused: No    Physically Abused: No    Sexually Abused: No    FH:  Family History  Problem Relation Age of Onset   Heart disease Father     Past Medical History:  Diagnosis Date   CHF (congestive heart failure) (HCC)    Coronary artery disease 05/01/2023   PCI/DES   HFrEF (heart failure with reduced ejection fraction) (HCC) 05/01/2023   Hiatal hernia    HLD (hyperlipidemia)    HTN (hypertension)    Ischemic cardiomyopathy 05/01/2023   LVEF 30-35%   Kidney stones    Pneumothorax     Current Outpatient Medications  Medication Sig Dispense Refill   albuterol  (VENTOLIN  HFA) 108 (90 Base) MCG/ACT inhaler Inhale 2 puffs into the lungs every 6 (six) hours as needed for wheezing or shortness of breath. 17 each 0   aspirin  81 MG chewable tablet Chew 1 tablet (81 mg total) by mouth daily. 90 tablet 2   atorvastatin  (LIPITOR) 40 MG tablet Take 1 tablet (40 mg total) by mouth daily. 90 tablet 3   dextromethorphan -guaiFENesin  (MUCINEX  DM) 30-600 MG 12hr tablet Take 1 tablet by mouth 2 (two) times daily as needed for cough. 30 tablet 0   ENTRESTO  24-26 MG Take 1 tablet by mouth 2 (two) times daily.     Fe Fum-Vit C-Vit B12-FA (TRIGELS-F FORTE) CAPS capsule  Take 1 capsule by mouth 2 (two) times daily. 180 capsule 0   furosemide  (LASIX ) 20 MG tablet Take 2 tablets (40 mg total) by mouth daily. (Patient taking differently: Take 20 mg by mouth daily.)     metoprolol  succinate (TOPROL -XL) 25 MG 24 hr tablet Take 1 tablet (25 mg total) by mouth daily. 90 tablet 3   nitroGLYCERIN  (NITROSTAT ) 0.4 MG SL tablet Place 1 tablet (0.4 mg total) under the tongue every 5 (five) minutes as needed for chest pain. 25 tablet 5   ondansetron  (  ZOFRAN -ODT) 4 MG disintegrating tablet Take 1 tablet (4 mg total) by mouth every 8 (eight) hours as needed. 20 tablet 0   sodium zirconium cyclosilicate  (LOKELMA ) 10 g PACK packet Take 10 g by mouth daily for 2 days. 2 packet 0   solifenacin (VESICARE) 10 MG tablet Take 10 mg by mouth daily.     tamsulosin  (FLOMAX ) 0.4 MG CAPS capsule Take 1 capsule (0.4 mg total) by mouth daily after supper. 30 capsule 2   traZODone  (DESYREL ) 50 MG tablet Take 1 tablet (50 mg total) by mouth at bedtime as needed for sleep. 90 tablet 1   No current facility-administered medications for this visit.   Vitals:   05/17/24 1215 05/17/24 1216  BP: (!) 89/52 (!) 94/55  Pulse: 76 76  SpO2: 98% 97%  Weight: 191 lb (86.6 kg) 191 lb (86.6 kg)   Wt Readings from Last 3 Encounters:  05/17/24 191 lb (86.6 kg)  05/14/24 188 lb 0.8 oz (85.3 kg)  03/01/24 197 lb 6.4 oz (89.5 kg)   Lab Results  Component Value Date   CREATININE 1.23 05/14/2024   CREATININE 1.35 (H) 05/13/2024   CREATININE 1.35 (H) 05/12/2024    PHYSICAL EXAM:  General: Ill appearing. No resp difficulty HEENT: normal Neck: supple, no JVD Cor: Regular rhythm, rate. No rubs, gallops or murmurs Lungs: clear Abdomen: soft, nontender, nondistended, + swelling right abdominal side. Extremities: no cyanosis, clubbing, rash, edema Neuro: alert & oriented X 3. Moves all 4 extremities w/o difficulty. Affect pleasant   ECG: not done  ReDs reading: 42 %, abnormal (see plan  below)   ASSESSMENT & PLAN:  1: Ischemic heart failure with reduced ejection fraction- - NSTEMI with PCI 06/24 - NYHA class III - moderately fluid up with elevated ReDs, home weight gain and worsening symptoms - ReDs 42% - discussed sending for IV lasix  but patient declines today - weight down 6 pounds from last visit here 2 months ago - Echo 04/30/23: EF 30-35% along with mild LVH and Grade II DD and moderate Ronnie.  - Echo 10/13/23: EF 25-30% with moderate LAE, moderate Ronnie - stop entresto  due to BP - increase furosemide  to 40mg  daily - continue metoprolol  succinate 25mg  daily - spiro was previously stopped due to hypotension - BMET, CBC, lipid panel today - due to chronic kidney stone; will not be a good candidate for SGLT2 - BNP 02/05/24 was 214.8  2: CAD- - was NS with cardiology [place]) 08/24; cardiology referral placed again today - continue atorvastatin  40mg  daily - LDL 10/11/23 was 122 - lipid panel today - LHC 05/01/23:   Prox LAD to Mid LAD lesion is 99% stenosed.   Mid LAD lesion is 60% stenosed.   1st Diag lesion is 70% stenosed.   Prox RCA lesion is 95% stenosed.   Mid RCA lesion is 30% stenosed.   RPDA lesion is 60% stenosed.   Dist LAD lesion is 99% stenosed.   A drug-eluting stent was successfully placed using a STENT ONYX FRONTIER 4.0X15.   Balloon angioplasty was performed using a BALLN [place] EUPHORA RX 2.75X20.   Post intervention, there is a 20% residual stenosis.   Post intervention, there is a 0% residual stenosis.   There is severe left ventricular systolic dysfunction.   LV end diastolic pressure is severely elevated.   There is moderate (3+) mitral regurgitation.  1.  Severe two-vessel coronary artery disease with subtotal occlusion of the proximal LAD, diffuse moderate mid LAD disease and severe distal  LAD disease.  In addition, there is 95% in the proximal right coronary artery with faint right to left collaterals to the LAD.  The left circumflex has mild  nonobstructive disease and OM 3 is large and reaches the apex. 2.  Severely reduced LV systolic function with an EF of 25%.  Severely elevated left ventricular end-diastolic pressure at 34 mmHg. 3.  Successful balloon angioplasty to the proximal LAD and drug-eluting stent placement to the proximal right coronary artery.  3: HTN- - BP 94/55; entresto  stopped today - saw PCP Neill) 04/25 - BMP 05/14/24 reviewed: sodium 138, potassium 5.4, creatinine 1.23 & GFR >60 - BMET today  4: Renal stone- - followed by [facility] urology (last seen 01/25) - has bilateral staghorn renal stones  - right ureteral stent placed 10/12/23  - PCNL and urethral stent done 01/22/24 with subsequent complication of pneumothorax - urethral stent removed by EDP on 02/05/24 with improvement of severe pain   Return in 1-2 weeks, sooner if needed. If symptoms worsen, he needs to go to the ED. Stop eliquis  per recent discharge summary.   Ellouise DELENA Class, FNP 05/16/24

## 2024-05-17 ENCOUNTER — Ambulatory Visit: Attending: Family | Admitting: Family

## 2024-05-17 ENCOUNTER — Other Ambulatory Visit: Payer: Self-pay

## 2024-05-17 ENCOUNTER — Encounter: Payer: Self-pay | Admitting: Family

## 2024-05-17 VITALS — BP 94/55 | HR 76 | Wt 191.0 lb

## 2024-05-17 DIAGNOSIS — I5022 Chronic systolic (congestive) heart failure: Secondary | ICD-10-CM | POA: Diagnosis not present

## 2024-05-17 DIAGNOSIS — I2510 Atherosclerotic heart disease of native coronary artery without angina pectoris: Secondary | ICD-10-CM | POA: Diagnosis not present

## 2024-05-17 DIAGNOSIS — Z955 Presence of coronary angioplasty implant and graft: Secondary | ICD-10-CM | POA: Diagnosis not present

## 2024-05-17 DIAGNOSIS — Z87442 Personal history of urinary calculi: Secondary | ICD-10-CM | POA: Diagnosis not present

## 2024-05-17 DIAGNOSIS — N200 Calculus of kidney: Secondary | ICD-10-CM

## 2024-05-17 DIAGNOSIS — R0602 Shortness of breath: Secondary | ICD-10-CM | POA: Diagnosis present

## 2024-05-17 DIAGNOSIS — E785 Hyperlipidemia, unspecified: Secondary | ICD-10-CM | POA: Diagnosis not present

## 2024-05-17 DIAGNOSIS — I130 Hypertensive heart and chronic kidney disease with heart failure and stage 1 through stage 4 chronic kidney disease, or unspecified chronic kidney disease: Secondary | ICD-10-CM | POA: Diagnosis not present

## 2024-05-17 DIAGNOSIS — N189 Chronic kidney disease, unspecified: Secondary | ICD-10-CM | POA: Diagnosis not present

## 2024-05-17 DIAGNOSIS — I25118 Atherosclerotic heart disease of native coronary artery with other forms of angina pectoris: Secondary | ICD-10-CM | POA: Diagnosis not present

## 2024-05-17 DIAGNOSIS — Z79899 Other long term (current) drug therapy: Secondary | ICD-10-CM | POA: Diagnosis not present

## 2024-05-17 DIAGNOSIS — I252 Old myocardial infarction: Secondary | ICD-10-CM | POA: Diagnosis not present

## 2024-05-17 DIAGNOSIS — Z87891 Personal history of nicotine dependence: Secondary | ICD-10-CM | POA: Insufficient documentation

## 2024-05-17 DIAGNOSIS — Z7902 Long term (current) use of antithrombotics/antiplatelets: Secondary | ICD-10-CM | POA: Insufficient documentation

## 2024-05-17 DIAGNOSIS — Z86711 Personal history of pulmonary embolism: Secondary | ICD-10-CM | POA: Diagnosis not present

## 2024-05-17 DIAGNOSIS — I10 Essential (primary) hypertension: Secondary | ICD-10-CM

## 2024-05-17 MED ORDER — FUROSEMIDE 20 MG PO TABS
40.0000 mg | ORAL_TABLET | Freq: Every day | ORAL | 5 refills | Status: DC
  Filled 2024-05-17: qty 60, 30d supply, fill #0

## 2024-05-17 NOTE — Patient Instructions (Addendum)
 Medication Changes:  STOP ELIQUIS   HOLD ENTRESTO   INCREASE LASIX  TO 40 MG ONCE DAILY   Lab Work:  Go DOWN to LOWER LEVEL (LL) to have your blood work completed inside of [employer] office. Also, go ahead and schedule your follow up appointment with them while you are in their clinic.  We will only call you if the results are abnormal or if the provider would like to make medication changes.   Special Instructions // Education:  We have placed a referral today to General Cardiology. They should reach out to you within 1-2 weeks. If they do not, please reach out to them. Their information is below on this AVS.   Go over to [facility] and schedule your follow up appointment with them while you are waiting to get your bloodwork drawn. They are located on the Lower Level of the [facility].   Follow-Up with us  next week.  At the [facility] Clinic, you and your health needs are our priority. We have a designated team specialized in the treatment of Heart Failure. This Care Team includes your primary Heart Failure Specialized Cardiologist (physician), Advanced Practice Providers (APPs- Physician Assistants and Nurse Practitioners), and Pharmacist who all work together to provide you with the care you need, when you need it.   You may see any of the following providers on your designated Care Team at your next follow up:  Dr. Toribio Fuel Dr. Ezra Shuck Dr. Ria Commander Dr. Odis Brownie Ellouise Class, FNP Jaun Bash, RPH-CPP  Please be sure to bring in all your medications bottles to every appointment.   Need to Contact Us :  If you have any questions or concerns before your next appointment please send us  a message through [employer] or call our office at [phone].    TO LEAVE A MESSAGE FOR THE NURSE SELECT OPTION 2, PLEASE LEAVE A MESSAGE INCLUDING: YOUR NAME DATE OF BIRTH CALL BACK NUMBER REASON FOR CALL**this is important as we prioritize the  call backs  YOU WILL RECEIVE A CALL BACK THE SAME DAY AS LONG AS YOU CALL BEFORE 4:00 PM

## 2024-05-18 LAB — LIPID PANEL
Chol/HDL Ratio: 3.3 ratio (ref 0.0–5.0)
Cholesterol, Total: 107 mg/dL (ref 100–199)
HDL: 32 mg/dL — ABNORMAL LOW (ref >39)
LDL Chol Calc (NIH): 51 mg/dL (ref 0–99)
Triglycerides: 133 mg/dL (ref 0–149)
VLDL Cholesterol Cal: 24 mg/dL (ref 5–40)

## 2024-05-18 LAB — CBC
Hematocrit: 30.7 % — ABNORMAL LOW (ref 37.5–51.0)
Hemoglobin: 9.4 g/dL — ABNORMAL LOW (ref 13.0–17.7)
MCH: 28.1 pg (ref 26.6–33.0)
MCHC: 30.6 g/dL — ABNORMAL LOW (ref 31.5–35.7)
MCV: 92 fL (ref 79–97)
Platelets: 196 10*3/uL (ref 150–450)
RBC: 3.35 x10E6/uL — ABNORMAL LOW (ref 4.14–5.80)
RDW: 14.5 % (ref 11.6–15.4)
WBC: 6.2 10*3/uL (ref 3.4–10.8)

## 2024-05-18 LAB — BASIC METABOLIC PANEL WITH GFR
BUN/Creatinine Ratio: 23 (ref 10–24)
BUN: 40 mg/dL — ABNORMAL HIGH (ref 8–27)
CO2: 18 mmol/L — ABNORMAL LOW (ref 20–29)
Calcium: 9.2 mg/dL (ref 8.6–10.2)
Chloride: 99 mmol/L (ref 96–106)
Creatinine, Ser: 1.71 mg/dL — ABNORMAL HIGH (ref 0.76–1.27)
Glucose: 146 mg/dL — ABNORMAL HIGH (ref 70–99)
Potassium: 4.7 mmol/L (ref 3.5–5.2)
Sodium: 133 mmol/L — ABNORMAL LOW (ref 134–144)
eGFR: 43 mL/min/{1.73_m2} — ABNORMAL LOW (ref >59)

## 2024-05-19 ENCOUNTER — Ambulatory Visit: Payer: Self-pay | Admitting: Family

## 2024-05-20 ENCOUNTER — Other Ambulatory Visit: Payer: Self-pay | Admitting: Family

## 2024-05-20 ENCOUNTER — Ambulatory Visit
Admission: RE | Admit: 2024-05-20 | Discharge: 2024-05-20 | Disposition: A | Discharge status: Home / Self Care | Source: Ambulatory Visit | Attending: Family | Admitting: Family

## 2024-05-20 DIAGNOSIS — I5022 Chronic systolic (congestive) heart failure: Secondary | ICD-10-CM

## 2024-05-20 MED ORDER — IRON SUCROSE 300 MG IVPB - SIMPLE MED: 300.0000 mg | Freq: Once | Status: DC

## 2024-05-21 ENCOUNTER — Other Ambulatory Visit: Payer: Self-pay

## 2024-05-21 NOTE — Progress Notes (Unsigned)
 Advanced Heart Failure Clinic Note    PCP: Bernardo Fend, DO (last seen 04/25) Primary Cardiologist: Timothy Gollan, MD (saw during 06/25 admission)  Chief Complaint: shortness of breath   HPI:  Ronnie Mann is a 68 y/o male with a history of NSTEMI, s/p PCI to proximal LAD and RCA on 05/01/23, HFrEF (EF 30 to 35%), previous tobacco/ alcohol use, HTN, CKD, UTI, hyperlipidemia, kidney stones since the age of 65, right uretal stone s/p stent 10/12/23, bilateral nephrolithiasis and pulmonary embolus (03/25).   Admitted 04/28/23 due to new onset of substernal vice like substernal chest pain that radiated to his left arm. He reports ongoing shortness of breath associated with this. He also was awoken from sleep with this last night and had broken out into a cold sweat. Elevated lactic acid and elevated WBC at 14.4. 1 L of IVF given. Initial troponin was 54 rose to 234. 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 Developed flash pulmonary edema during left heart cath 06/10 and was treated with nitroglycerin  drip. Diuretics held. Status post successful balloon angioplasty to the proximal LAD and drug-eluting stent placement to the proximal right coronary artery. Treated for pneumonia with antibiotics. Was in the ED 05/13/23 due to bilateral flank and suprapubic pain. CT renal was stable. Admitted 05/16/23 due to shortness of breath, bilateral flank pain radiating towards lower abdomen, more on right than left, dysuria and gross hematuria. Patient deferred urology stent placement. Able to void after foley removed. Brilinta  was switched to Plavix , aspirin  discontinued in the setting of hematuria by cardiologist    Admitted 10/10/23 due to shortness of breath, hematuria and flank pain. Started on IV lasix  for HF exacerbation. Cardiology consulted and feel respiratory symptoms more related to bronchitis. Lasix  held and solu-medrol  provided. Respiratory panel negative.  Urology consulted and stent placed. Losartan  was changed to entresto .   Admitted 11/04/23 due to right flank pain. RUQ sono showed cholelithiasis with positive sonographic Murphy's borderline thickening of gallbladder wall at fundus suspicious for acute cholecystitis.  CT renal stone study showed right ureteral stent in position, persistent calculi in right ureter, pelvis and proximal mid left ureter.  Surgery team evaluated the patient in the ED, advised HIDA scan which ruled out acute cholecystitis. Entresto , lasix  and spironolactone  held due to low BP. To follow up with [facility] urology early January 2025.  Has had 2 ED visits 01/25 due to pain from his kidney stone  Had PCNL and ureteral stent placed 01/22/24 complicated by intraoperative pneumothorax requiring right-sided chest tube placement with Interventional Radiology on 01/22/2024 at [facility]. He also received pressors intraoperatively for which IV was infiltrated with concern for right forearm necrosis. Had episode of sudden chest pain radiating to the left arm. The patient had an equivocal EKG with possible ST elevations and Troponins in the 700s. Cardiology service was consulted for their recommendations, and there was no acute intervention for cath lab. Troponins peaked later that night to the 2000s and later downtrended to 1800s. On POD4 the pigtail chest tube was removed with stable CXR, post-pull chest XR was stable.     Admitted 01/30/24 with sudden onset of chest pain localized in the left side chest radiate to the left arm-8:30 AM yesterday. He took some nitroglycerin , and slept. He had another episode of chest pain today at 9:30 AM, was described as a sharp, persistent. 10/10 in severity. Radiate to the left arm. Worsening shortness of breath where he has to sleep in  the recliner. Troponins only mildly elevated and stable. Cardiology consulted, does not think this is ACS. VQ scan pursued which shows acute pulmonary embolus  (segmental in LLL and subsegmental in RLL. Heparin  drip started. Urology consulted. Started on apixaban .   Was in the ER 02/05/24 due to gross hematuria. CT showed no new ureteral stones or any evidence of clots in the bladder. Urology consulted and said to remove the stent. Stent removed with immediate improvement of symptoms.   Most recent ED visit 02/14/24 with right chest pain/ flank pain. CT shows mild area of uncomplicated colitis in the ascending colon.   He came to office 02/19/24 to have staff place zio and he c/o chest pain. BP was low. Encouraged ER but patient refused. Spironolactone  was stopped.   Admitted 05/10/24 with shortness of breath. Has associated fatigue, chest pain (usually at night), rhinorrhea, cough, abdominal distention on right lower side. Has gained 7 pounds since discharge 3 days ago   He presents today for a HF f/u visit with a chief complaint of shortness of breath with little exertion. Has associated fatigue, dizziness, right sided abdominal swelling and intermittent chest pain. Denies palpitations, abdominal distention, pedal edema. He says that he has gained 7 pounds since discharge 3 days ago. Reports some confusion with his medications with his frequent admissions.   Previous cardiac studies:   Echo 04/30/23: EF 30-35% along with mild LVH and Grade II DD and moderate Ronnie.  Echo 10/13/23: EF 25-30% with moderate LAE, moderate Ronnie  LHC 05/01/23:   Prox LAD to Mid LAD lesion is 99% stenosed.   Mid LAD lesion is 60% stenosed.   1st Diag lesion is 70% stenosed.   Prox RCA lesion is 95% stenosed.   Mid RCA lesion is 30% stenosed.   RPDA lesion is 60% stenosed.   Dist LAD lesion is 99% stenosed.   A drug-eluting stent was successfully placed using a STENT ONYX FRONTIER 4.0X15.   Balloon angioplasty was performed using a BALLN [place] EUPHORA RX 2.75X20.   Post intervention, there is a 20% residual stenosis.   Post intervention, there is a 0% residual stenosis.   There is  severe left ventricular systolic dysfunction.   LV end diastolic pressure is severely elevated.   There is moderate (3+) mitral regurgitation.  1.  Severe two-vessel coronary artery disease with subtotal occlusion of the proximal LAD, diffuse moderate mid LAD disease and severe distal LAD disease.  In addition, there is 95% in the proximal right coronary artery with faint right to left collaterals to the LAD.  The left circumflex has mild nonobstructive disease and OM 3 is large and reaches the apex. 2.  Severely reduced LV systolic function with an EF of 25%.  Severely elevated left ventricular end-diastolic pressure at 34 mmHg. 3.  Successful balloon angioplasty to the proximal LAD and drug-eluting stent placement to the proximal right coronary artery.   ROS: All systems negative except as listed in HPI, PMH and Problem List.  SH:  Social History   Socioeconomic History   Marital status: Divorced    Spouse name: Not on file   Number of children: Not on file   Years of education: Not on file   Highest education level: 10th grade  Occupational History   Not on file  Tobacco Use   Smoking status: Former    Types: Cigarettes    Passive exposure: Never   Smokeless tobacco: Never  Vaping Use   Vaping status: Never Used  Substance and Sexual Activity   Alcohol use: Not Currently    Comment: former moderate drinker   Drug use: Yes    Types: Marijuana   Sexual activity: Not on file  Other Topics Concern   Not on file  Social History Narrative   Not on file   Social Drivers of Health   Financial Resource Strain: Low Risk  (01/23/2024)   Received from [facility]   Overall Financial Resource Strain (CARDIA)    Difficulty of Paying Living Expenses: Not hard at all  Food Insecurity: No Food Insecurity (05/10/2024)   Hunger Vital Sign    Worried About Running Out of Food in the Last Year: Never true    Ran Out of Food in the Last Year: Never true  Transportation Needs: No  Transportation Needs (05/10/2024)   PRAPARE - Administrator, Civil Service (Medical): No    Lack of Transportation (Non-Medical): No  Physical Activity: Not on file  Stress: Not on file  Social Connections: Socially Isolated (05/10/2024)   Social Connection and Isolation Panel    Frequency of Communication with Friends and Family: Twice a week    Frequency of Social Gatherings with Friends and Family: Never    Attends Religious Services: Never    Database administrator or Organizations: No    Attends Banker Meetings: Never    Marital Status: Divorced  Catering manager Violence: Not At Risk (05/10/2024)   Humiliation, Afraid, Rape, and Kick questionnaire    Fear of Current or Ex-Partner: No    Emotionally Abused: No    Physically Abused: No    Sexually Abused: No    FH:  Family History  Problem Relation Age of Onset   Heart disease Father     Past Medical History:  Diagnosis Date   CHF (congestive heart failure) (HCC)    Coronary artery disease 05/01/2023   PCI/DES   HFrEF (heart failure with reduced ejection fraction) (HCC) 05/01/2023   Hiatal hernia    HLD (hyperlipidemia)    HTN (hypertension)    Ischemic cardiomyopathy 05/01/2023   LVEF 30-35%   Kidney stones    Pneumothorax     Current Outpatient Medications  Medication Sig Dispense Refill   albuterol  (VENTOLIN  HFA) 108 (90 Base) MCG/ACT inhaler Inhale 2 puffs into the lungs every 6 (six) hours as needed for wheezing or shortness of breath. 17 each 0   aspirin  81 MG chewable tablet Chew 1 tablet (81 mg total) by mouth daily. 90 tablet 2   atorvastatin  (LIPITOR) 40 MG tablet Take 1 tablet (40 mg total) by mouth daily. 90 tablet 3   dextromethorphan -guaiFENesin  (MUCINEX  DM) 30-600 MG 12hr tablet Take 1 tablet by mouth 2 (two) times daily as needed for cough. 30 tablet 0   ENTRESTO  24-26 MG Take 1 tablet by mouth 2 (two) times daily.     Fe Fum-Vit C-Vit B12-FA (TRIGELS-F FORTE) CAPS capsule  Take 1 capsule by mouth 2 (two) times daily. 180 capsule 0   furosemide  (LASIX ) 20 MG tablet Take 2 tablets (40 mg total) by mouth daily. 60 tablet 5   metoprolol  succinate (TOPROL -XL) 25 MG 24 hr tablet Take 1 tablet (25 mg total) by mouth daily. 90 tablet 3   nitroGLYCERIN  (NITROSTAT ) 0.4 MG SL tablet Place 1 tablet (0.4 mg total) under the tongue every 5 (five) minutes as needed for chest pain. 25 tablet 5   ondansetron  (ZOFRAN -ODT) 4 MG disintegrating tablet Take 1 tablet (  4 mg total) by mouth every 8 (eight) hours as needed. 20 tablet 0   solifenacin (VESICARE) 10 MG tablet Take 10 mg by mouth daily. (Patient not taking: Reported on 05/17/2024)     tamsulosin  (FLOMAX ) 0.4 MG CAPS capsule Take 1 capsule (0.4 mg total) by mouth daily after supper. (Patient not taking: Reported on 05/17/2024) 30 capsule 2   traZODone  (DESYREL ) 50 MG tablet Take 1 tablet (50 mg total) by mouth at bedtime as needed for sleep. 90 tablet 1   No current facility-administered medications for this visit.   There were no vitals filed for this visit.  Wt Readings from Last 3 Encounters:  05/17/24 191 lb (86.6 kg)  05/14/24 188 lb 0.8 oz (85.3 kg)  03/01/24 197 lb 6.4 oz (89.5 kg)   Lab Results  Component Value Date   CREATININE 1.71 (H) 05/17/2024   CREATININE 1.23 05/14/2024   CREATININE 1.35 (H) 05/13/2024    PHYSICAL EXAM:  General: Ill appearing. No resp difficulty HEENT: normal Neck: supple, no JVD Cor: Regular rhythm, rate. No rubs, gallops or murmurs Lungs: clear Abdomen: soft, nontender, nondistended, + swelling right abdominal side. Extremities: no cyanosis, clubbing, rash, edema Neuro: alert & oriented X 3. Moves all 4 extremities w/o difficulty. Affect pleasant   ECG: not done  ReDs reading: 42 %, abnormal (see plan below)   ASSESSMENT & PLAN:  1: Ischemic heart failure with reduced ejection fraction- - NSTEMI with PCI 06/24 - NYHA class III - moderately fluid up with elevated ReDs,  home weight gain and worsening symptoms - ReDs 42% - discussed sending for IV lasix  but patient declines today - weight down 6 pounds from last visit here 2 months ago - Echo 04/30/23: EF 30-35% along with mild LVH and Grade II DD and moderate Ronnie.  - Echo 10/13/23: EF 25-30% with moderate LAE, moderate Ronnie - stop entresto  due to BP - increase furosemide  to 40mg  daily - continue metoprolol  succinate 25mg  daily - spiro was previously stopped due to hypotension - BMET, CBC, lipid panel today - due to chronic kidney stone; will not be a good candidate for SGLT2 - BNP 02/05/24 was 214.8  2: CAD- - was NS with cardiology [place]) 08/24; cardiology referral placed again today - continue atorvastatin  40mg  daily - LDL 10/11/23 was 122 - lipid panel today - LHC 05/01/23:   Prox LAD to Mid LAD lesion is 99% stenosed.   Mid LAD lesion is 60% stenosed.   1st Diag lesion is 70% stenosed.   Prox RCA lesion is 95% stenosed.   Mid RCA lesion is 30% stenosed.   RPDA lesion is 60% stenosed.   Dist LAD lesion is 99% stenosed.   A drug-eluting stent was successfully placed using a STENT ONYX FRONTIER 4.0X15.   Balloon angioplasty was performed using a BALLN [place] EUPHORA RX 2.75X20.   Post intervention, there is a 20% residual stenosis.   Post intervention, there is a 0% residual stenosis.   There is severe left ventricular systolic dysfunction.   LV end diastolic pressure is severely elevated.   There is moderate (3+) mitral regurgitation.  1.  Severe two-vessel coronary artery disease with subtotal occlusion of the proximal LAD, diffuse moderate mid LAD disease and severe distal LAD disease.  In addition, there is 95% in the proximal right coronary artery with faint right to left collaterals to the LAD.  The left circumflex has mild nonobstructive disease and OM 3 is large and reaches the apex. 2.  Severely reduced LV systolic function with an EF of 25%.  Severely elevated left ventricular end-diastolic  pressure at 34 mmHg. 3.  Successful balloon angioplasty to the proximal LAD and drug-eluting stent placement to the proximal right coronary artery.  3: HTN- - BP 94/55; entresto  stopped today - saw PCP Neill) 04/25 - BMP 05/14/24 reviewed: sodium 138, potassium 5.4, creatinine 1.23 & GFR >60 - BMET today  4: Renal stone- - followed by [facility] urology (last seen 01/25) - has bilateral staghorn renal stones  - right ureteral stent placed 10/12/23  - PCNL and urethral stent done 01/22/24 with subsequent complication of pneumothorax - urethral stent removed by EDP on 02/05/24 with improvement of severe pain   Return in 1-2 weeks, sooner if needed. If symptoms worsen, he needs to go to the ED. Stop eliquis  per recent discharge summary.   Ellouise DELENA Class, FNP 05/21/24

## 2024-05-22 ENCOUNTER — Ambulatory Visit
Admission: RE | Admit: 2024-05-22 | Discharge: 2024-05-22 | Disposition: A | Discharge status: Home / Self Care | Source: Ambulatory Visit | Attending: Family | Admitting: Family

## 2024-05-22 ENCOUNTER — Encounter: Payer: Self-pay | Admitting: Family

## 2024-05-22 ENCOUNTER — Other Ambulatory Visit: Payer: Self-pay

## 2024-05-22 ENCOUNTER — Ambulatory Visit: Payer: Self-pay | Admitting: Family

## 2024-05-22 ENCOUNTER — Ambulatory Visit: Admitting: Family

## 2024-05-22 VITALS — BP 103/63 | HR 66 | Temp 97.3°F | Resp 17

## 2024-05-22 VITALS — BP 92/53 | HR 75 | Wt 192.0 lb

## 2024-05-22 DIAGNOSIS — R0602 Shortness of breath: Secondary | ICD-10-CM | POA: Diagnosis not present

## 2024-05-22 DIAGNOSIS — I10 Essential (primary) hypertension: Secondary | ICD-10-CM

## 2024-05-22 DIAGNOSIS — R140 Abdominal distension (gaseous): Secondary | ICD-10-CM | POA: Diagnosis not present

## 2024-05-22 DIAGNOSIS — N132 Hydronephrosis with renal and ureteral calculous obstruction: Secondary | ICD-10-CM | POA: Diagnosis not present

## 2024-05-22 DIAGNOSIS — Z87891 Personal history of nicotine dependence: Secondary | ICD-10-CM | POA: Diagnosis not present

## 2024-05-22 DIAGNOSIS — I110 Hypertensive heart disease with heart failure: Secondary | ICD-10-CM | POA: Insufficient documentation

## 2024-05-22 DIAGNOSIS — D649 Anemia, unspecified: Secondary | ICD-10-CM

## 2024-05-22 DIAGNOSIS — R5383 Other fatigue: Secondary | ICD-10-CM | POA: Diagnosis not present

## 2024-05-22 DIAGNOSIS — N1831 Chronic kidney disease, stage 3a: Secondary | ICD-10-CM

## 2024-05-22 DIAGNOSIS — I340 Nonrheumatic mitral (valve) insufficiency: Secondary | ICD-10-CM | POA: Insufficient documentation

## 2024-05-22 DIAGNOSIS — I25118 Atherosclerotic heart disease of native coronary artery with other forms of angina pectoris: Secondary | ICD-10-CM | POA: Diagnosis not present

## 2024-05-22 DIAGNOSIS — I471 Supraventricular tachycardia, unspecified: Secondary | ICD-10-CM

## 2024-05-22 DIAGNOSIS — Z79899 Other long term (current) drug therapy: Secondary | ICD-10-CM | POA: Insufficient documentation

## 2024-05-22 DIAGNOSIS — I252 Old myocardial infarction: Secondary | ICD-10-CM | POA: Insufficient documentation

## 2024-05-22 DIAGNOSIS — I959 Hypotension, unspecified: Secondary | ICD-10-CM | POA: Diagnosis not present

## 2024-05-22 DIAGNOSIS — I2510 Atherosclerotic heart disease of native coronary artery without angina pectoris: Secondary | ICD-10-CM | POA: Insufficient documentation

## 2024-05-22 DIAGNOSIS — I5022 Chronic systolic (congestive) heart failure: Secondary | ICD-10-CM | POA: Insufficient documentation

## 2024-05-22 DIAGNOSIS — R195 Other fecal abnormalities: Secondary | ICD-10-CM | POA: Insufficient documentation

## 2024-05-22 DIAGNOSIS — N200 Calculus of kidney: Secondary | ICD-10-CM

## 2024-05-22 DIAGNOSIS — Z955 Presence of coronary angioplasty implant and graft: Secondary | ICD-10-CM | POA: Diagnosis not present

## 2024-05-22 LAB — BASIC METABOLIC PANEL WITH GFR
Anion gap: 9 (ref 5–15)
BUN: 43 mg/dL — ABNORMAL HIGH (ref 8–23)
CO2: 23 mmol/L (ref 22–32)
Calcium: 9 mg/dL (ref 8.9–10.3)
Chloride: 103 mmol/L (ref 98–111)
Creatinine, Ser: 1.52 mg/dL — ABNORMAL HIGH (ref 0.61–1.24)
GFR, Estimated: 50 mL/min — ABNORMAL LOW (ref >60)
Glucose, Bld: 137 mg/dL — ABNORMAL HIGH (ref 70–99)
Potassium: 4.3 mmol/L (ref 3.5–5.1)
Sodium: 135 mmol/L (ref 135–145)

## 2024-05-22 LAB — BRAIN NATRIURETIC PEPTIDE: B Natriuretic Peptide: 235.1 pg/mL — ABNORMAL HIGH (ref 0.0–100.0)

## 2024-05-22 LAB — CBC
HCT: 29.3 % — ABNORMAL LOW (ref 39.0–52.0)
Hemoglobin: 9.5 g/dL — ABNORMAL LOW (ref 13.0–17.0)
MCH: 29 pg (ref 26.0–34.0)
MCHC: 32.4 g/dL (ref 30.0–36.0)
MCV: 89.3 fL (ref 80.0–100.0)
Platelets: 217 10*3/uL (ref 150–400)
RBC: 3.28 MIL/uL — ABNORMAL LOW (ref 4.22–5.81)
RDW: 15.9 % — ABNORMAL HIGH (ref 11.5–15.5)
WBC: 4.4 10*3/uL (ref 4.0–10.5)
nRBC: 0 % (ref 0.0–0.2)

## 2024-05-22 MED ORDER — IRON SUCROSE 300 MG IVPB - SIMPLE MED
300.0000 mg | Freq: Once | Status: AC
  Administered 2024-05-22: 300 mg via INTRAVENOUS
  Filled 2024-05-22: qty 300

## 2024-05-22 MED ORDER — FUROSEMIDE 10 MG/ML IJ SOLN
80.0000 mg | Freq: Once | INTRAMUSCULAR | Status: AC
  Administered 2024-05-22: 80 mg via INTRAVENOUS

## 2024-05-22 MED ORDER — FUROSEMIDE 10 MG/ML IJ SOLN
INTRAMUSCULAR | Status: AC
  Filled 2024-05-22: qty 8

## 2024-05-22 MED ORDER — POTASSIUM CHLORIDE CRYS ER 20 MEQ PO TBCR
40.0000 meq | EXTENDED_RELEASE_TABLET | Freq: Once | ORAL | Status: AC
  Administered 2024-05-22: 40 meq via ORAL

## 2024-05-22 MED ORDER — POTASSIUM CHLORIDE CRYS ER 20 MEQ PO TBCR
EXTENDED_RELEASE_TABLET | ORAL | Status: AC
  Filled 2024-05-22: qty 2

## 2024-05-22 NOTE — Progress Notes (Signed)
 ReDS Vest / Clip - 05/22/24 0911       ReDS Vest / Clip   Station Marker C    Ruler Value 29    ReDS Value Range High volume overload    ReDS Actual Value 43

## 2024-05-22 NOTE — Patient Instructions (Signed)
 Referrals:  We have placed a referral today to General Cardiology. They should reach out to you within 1-2 weeks. If they do not, please reach out to them. Their information is below on this AVS. They were unable to reach you last time so please answer when they reach out to you so they can get you scheduled.   Special Instructions // Education:  Go do your IV Lasix  and Iron  infusion today.  Make sure you are only taking in 60-64 oz of fluid.   Follow-Up  next week with Dr. Zenaida.  At the [facility], you and your health needs are our priority. We have a designated team specialized in the treatment of Heart Failure. This Care Team includes your primary Heart Failure Specialized Cardiologist (physician), Advanced Practice Providers (APPs- Physician Assistants and Nurse Practitioners), and Pharmacist who all work together to provide you with the care you need, when you need it.   You may see any of the following providers on your designated Care Team at your next follow up:  Dr. Toribio Fuel Dr. Ezra Shuck Dr. Ria Commander Dr. Odis Zenaida Ellouise Class, FNP Jaun Bash, RPH-CPP  Please be sure to bring in all your medications bottles to every appointment.   Need to Contact Us :  If you have any questions or concerns before your next appointment please send us  a message through [place] or call our office at [phone].    TO LEAVE A MESSAGE FOR THE NURSE SELECT OPTION 2, PLEASE LEAVE A MESSAGE INCLUDING: YOUR NAME DATE OF BIRTH CALL BACK NUMBER REASON FOR CALL**this is important as we prioritize the call backs  YOU WILL RECEIVE A CALL BACK THE SAME DAY AS LONG AS YOU CALL BEFORE 4:00 PM

## 2024-05-22 NOTE — Addendum Note (Signed)
 Addended by: SHARL GRATE A on: 05/22/2024 11:03 AM   Modules accepted: Orders

## 2024-05-23 ENCOUNTER — Other Ambulatory Visit: Payer: Self-pay

## 2024-05-27 ENCOUNTER — Other Ambulatory Visit: Payer: Self-pay

## 2024-05-28 ENCOUNTER — Telehealth: Payer: Self-pay | Admitting: Family

## 2024-05-28 ENCOUNTER — Other Ambulatory Visit: Payer: Self-pay

## 2024-05-28 NOTE — Telephone Encounter (Signed)
 Called to confirm/remind patient of their appointment at the [facility] Clinic on 05/29/24.   Appointment:   [] Confirmed  [] Left mess   [] No answer/No voice mail  [x] VM Full/unable to leave message  [] Phone not in service  Patient reminded to bring all medications and/or complete list.  Confirmed patient has transportation. Gave directions, instructed to utilize valet parking.

## 2024-05-29 ENCOUNTER — Encounter: Admitting: Cardiology

## 2024-06-07 ENCOUNTER — Other Ambulatory Visit: Payer: Self-pay | Admitting: Obstetrics and Gynecology

## 2024-06-07 ENCOUNTER — Other Ambulatory Visit: Payer: Self-pay | Admitting: Family

## 2024-06-07 ENCOUNTER — Other Ambulatory Visit: Payer: Self-pay

## 2024-06-10 ENCOUNTER — Other Ambulatory Visit: Payer: Self-pay

## 2024-07-09 NOTE — Progress Notes (Deleted)
  Electrophysiology Office Note:    Date:  07/09/2024   ID:  [name], DOB Mar 22, 1956, MRN [id number]  [facility] Cardiologist:  Evalene Lunger, MD  [facility] Electrophysiologist:  OLE ONEIDA HOLTS, MD   Referring MD: Donette Ellouise LABOR, FNP   Chief Complaint: SVT  History of Present Illness:    Mr. Ronnie Mann is a 68 year old man who I am seeing today for an evaluation of SVT at the request of [name], NP.  The patient has a history of coronary artery disease with prior PCI, chronic systolic heart failure, tobacco and alcohol use, hypertension, CKD, hyperlipidemia and pulmonary embolism. He has had multiple hospitalizations over the last year.  Several for urinary issues.  He was hospitalized another time in March for PE with chest pain.  He saw [facility] May 22, 2024.  At that appointment he reported dietary indiscretion.     Their past medical, social and family history was reviewed.   ROS:   Please see the history of present illness.    All other systems reviewed and are negative.  EKGs/Labs/Other Studies Reviewed:    The following studies were reviewed today:  Apr 08, 2024 ZIO monitor Heart rate 38-1 76, average 69 5 nonsustained VT, longest 5 beats 392 nonsustained SVT, longest 17 beats Frequent supraventricular ectopy, 9.3% Rare ventricular ectopy       Physical Exam:    VS:  There were no vitals taken for this visit.    Wt Readings from Last 3 Encounters:  05/22/24 192 lb (87.1 kg)  05/17/24 191 lb (86.6 kg)  05/14/24 188 lb 0.8 oz (85.3 kg)     GEN: no distress CARD: RRR, No MRG RESP: No IWOB. CTAB.        ASSESSMENT AND PLAN:    No diagnosis found.   #SVT Rhythm strips suggest atrial tachycardia.  Very low burden overall.  No sustained episodes.  I suspect this is secondary to atrial dilation related to his heart failure.  Recommend continued optimization of his heart failure and volume status.  Continue metoprolol  succinate, increase  dose to 50 mg by mouth once daily.***  #Chronic systolic heart failure NYHA class II-III.  Follows with [name].  Continue metoprolol , Lasix , Entresto    Follow-up with EP on an as-needed basis     Signed, OLE T. HOLTS, MD, [facility], [facility] 07/09/2024 1:13 PM    Electrophysiology [facility]

## 2024-07-10 ENCOUNTER — Other Ambulatory Visit: Payer: Self-pay

## 2024-07-10 ENCOUNTER — Ambulatory Visit: Admitting: Cardiology

## 2024-07-10 ENCOUNTER — Emergency Department

## 2024-07-10 ENCOUNTER — Encounter: Payer: Self-pay | Admitting: Emergency Medicine

## 2024-07-10 ENCOUNTER — Inpatient Hospital Stay
Admission: EM | Admit: 2024-07-10 | Discharge: 2024-07-14 | DRG: 689 | Disposition: A | Discharge status: Home / Self Care | Attending: Internal Medicine | Admitting: Internal Medicine

## 2024-07-10 DIAGNOSIS — I2489 Other forms of acute ischemic heart disease: Secondary | ICD-10-CM | POA: Diagnosis present

## 2024-07-10 DIAGNOSIS — N4889 Other specified disorders of penis: Secondary | ICD-10-CM | POA: Diagnosis not present

## 2024-07-10 DIAGNOSIS — N202 Calculus of kidney with calculus of ureter: Secondary | ICD-10-CM

## 2024-07-10 DIAGNOSIS — R310 Gross hematuria: Secondary | ICD-10-CM | POA: Diagnosis present

## 2024-07-10 DIAGNOSIS — Z87891 Personal history of nicotine dependence: Secondary | ICD-10-CM

## 2024-07-10 DIAGNOSIS — I700 Atherosclerosis of aorta: Secondary | ICD-10-CM | POA: Diagnosis present

## 2024-07-10 DIAGNOSIS — G8929 Other chronic pain: Secondary | ICD-10-CM | POA: Diagnosis not present

## 2024-07-10 DIAGNOSIS — K449 Diaphragmatic hernia without obstruction or gangrene: Secondary | ICD-10-CM | POA: Diagnosis present

## 2024-07-10 DIAGNOSIS — R52 Pain, unspecified: Secondary | ICD-10-CM

## 2024-07-10 DIAGNOSIS — Z8249 Family history of ischemic heart disease and other diseases of the circulatory system: Secondary | ICD-10-CM

## 2024-07-10 DIAGNOSIS — N200 Calculus of kidney: Secondary | ICD-10-CM | POA: Diagnosis not present

## 2024-07-10 DIAGNOSIS — L89151 Pressure ulcer of sacral region, stage 1: Secondary | ICD-10-CM | POA: Diagnosis present

## 2024-07-10 DIAGNOSIS — N1330 Unspecified hydronephrosis: Secondary | ICD-10-CM | POA: Diagnosis not present

## 2024-07-10 DIAGNOSIS — Z87442 Personal history of urinary calculi: Secondary | ICD-10-CM | POA: Diagnosis not present

## 2024-07-10 DIAGNOSIS — E785 Hyperlipidemia, unspecified: Secondary | ICD-10-CM | POA: Diagnosis present

## 2024-07-10 DIAGNOSIS — R7989 Other specified abnormal findings of blood chemistry: Secondary | ICD-10-CM

## 2024-07-10 DIAGNOSIS — I5022 Chronic systolic (congestive) heart failure: Secondary | ICD-10-CM

## 2024-07-10 DIAGNOSIS — I471 Supraventricular tachycardia, unspecified: Secondary | ICD-10-CM | POA: Diagnosis not present

## 2024-07-10 DIAGNOSIS — Z6828 Body mass index (BMI) 28.0-28.9, adult: Secondary | ICD-10-CM

## 2024-07-10 DIAGNOSIS — Z7982 Long term (current) use of aspirin: Secondary | ICD-10-CM

## 2024-07-10 DIAGNOSIS — I130 Hypertensive heart and chronic kidney disease with heart failure and stage 1 through stage 4 chronic kidney disease, or unspecified chronic kidney disease: Secondary | ICD-10-CM | POA: Diagnosis present

## 2024-07-10 DIAGNOSIS — N179 Acute kidney failure, unspecified: Secondary | ICD-10-CM | POA: Diagnosis not present

## 2024-07-10 DIAGNOSIS — I21A1 Myocardial infarction type 2: Secondary | ICD-10-CM | POA: Diagnosis not present

## 2024-07-10 DIAGNOSIS — Z765 Malingerer [conscious simulation]: Secondary | ICD-10-CM

## 2024-07-10 DIAGNOSIS — I2510 Atherosclerotic heart disease of native coronary artery without angina pectoris: Secondary | ICD-10-CM | POA: Diagnosis present

## 2024-07-10 DIAGNOSIS — N136 Pyonephrosis: Principal | ICD-10-CM | POA: Diagnosis present

## 2024-07-10 DIAGNOSIS — E663 Overweight: Secondary | ICD-10-CM | POA: Diagnosis present

## 2024-07-10 DIAGNOSIS — N1831 Chronic kidney disease, stage 3a: Secondary | ICD-10-CM | POA: Diagnosis present

## 2024-07-10 DIAGNOSIS — I10 Essential (primary) hypertension: Secondary | ICD-10-CM | POA: Diagnosis present

## 2024-07-10 DIAGNOSIS — Z955 Presence of coronary angioplasty implant and graft: Secondary | ICD-10-CM

## 2024-07-10 DIAGNOSIS — Z79899 Other long term (current) drug therapy: Secondary | ICD-10-CM

## 2024-07-10 DIAGNOSIS — R1031 Right lower quadrant pain: Secondary | ICD-10-CM

## 2024-07-10 DIAGNOSIS — N390 Urinary tract infection, site not specified: Principal | ICD-10-CM | POA: Diagnosis present

## 2024-07-10 DIAGNOSIS — K8020 Calculus of gallbladder without cholecystitis without obstruction: Secondary | ICD-10-CM | POA: Diagnosis present

## 2024-07-10 DIAGNOSIS — F419 Anxiety disorder, unspecified: Secondary | ICD-10-CM | POA: Diagnosis present

## 2024-07-10 DIAGNOSIS — D3502 Benign neoplasm of left adrenal gland: Secondary | ICD-10-CM | POA: Diagnosis present

## 2024-07-10 DIAGNOSIS — Z960 Presence of urogenital implants: Secondary | ICD-10-CM | POA: Diagnosis present

## 2024-07-10 DIAGNOSIS — I255 Ischemic cardiomyopathy: Secondary | ICD-10-CM | POA: Diagnosis present

## 2024-07-10 DIAGNOSIS — Z888 Allergy status to other drugs, medicaments and biological substances status: Secondary | ICD-10-CM

## 2024-07-10 LAB — URINALYSIS, ROUTINE W REFLEX MICROSCOPIC
Bilirubin Urine: NEGATIVE
Glucose, UA: NEGATIVE mg/dL
Ketones, ur: NEGATIVE mg/dL
Nitrite: NEGATIVE
Protein, ur: >=300 mg/dL — AB
RBC / HPF: >50 RBC/hpf (ref 0–5)
Specific Gravity, Urine: 1.007 (ref 1.005–1.030)
Squamous Epithelial / HPF: 0 /HPF (ref 0–5)
WBC, UA: >50 WBC/hpf (ref 0–5)
pH: 6 (ref 5.0–8.0)

## 2024-07-10 LAB — CBC
HCT: 33.9 % — ABNORMAL LOW (ref 39.0–52.0)
Hemoglobin: 11.4 g/dL — ABNORMAL LOW (ref 13.0–17.0)
MCH: 30.6 pg (ref 26.0–34.0)
MCHC: 33.6 g/dL (ref 30.0–36.0)
MCV: 91.1 fL (ref 80.0–100.0)
Platelets: 350 K/uL (ref 150–400)
RBC: 3.72 MIL/uL — ABNORMAL LOW (ref 4.22–5.81)
RDW: 16.9 % — ABNORMAL HIGH (ref 11.5–15.5)
WBC: 11 K/uL — ABNORMAL HIGH (ref 4.0–10.5)
nRBC: 0 % (ref 0.0–0.2)

## 2024-07-10 LAB — BASIC METABOLIC PANEL WITH GFR
Anion gap: 6 (ref 5–15)
BUN: 35 mg/dL — ABNORMAL HIGH (ref 8–23)
CO2: 26 mmol/L (ref 22–32)
Calcium: 9.5 mg/dL (ref 8.9–10.3)
Chloride: 101 mmol/L (ref 98–111)
Creatinine, Ser: 1.6 mg/dL — ABNORMAL HIGH (ref 0.61–1.24)
GFR, Estimated: 47 mL/min — ABNORMAL LOW (ref >60)
Glucose, Bld: 144 mg/dL — ABNORMAL HIGH (ref 70–99)
Potassium: 4.3 mmol/L (ref 3.5–5.1)
Sodium: 133 mmol/L — ABNORMAL LOW (ref 135–145)

## 2024-07-10 LAB — BRAIN NATRIURETIC PEPTIDE: B Natriuretic Peptide: 203.8 pg/mL — ABNORMAL HIGH (ref 0.0–100.0)

## 2024-07-10 MED ORDER — KETOROLAC TROMETHAMINE 15 MG/ML IJ SOLN
15.0000 mg | Freq: Once | INTRAMUSCULAR | Status: AC
  Administered 2024-07-10: 15 mg via INTRAVENOUS
  Filled 2024-07-10: qty 1

## 2024-07-10 MED ORDER — DM-GUAIFENESIN ER 30-600 MG PO TB12: 1.0000 | ORAL_TABLET | Freq: Two times a day (BID) | ORAL | Status: DC | PRN

## 2024-07-10 MED ORDER — NITROGLYCERIN 0.4 MG SL SUBL
0.4000 mg | SUBLINGUAL_TABLET | SUBLINGUAL | Status: DC | PRN
  Administered 2024-07-11 – 2024-07-12 (×5): 0.4 mg via SUBLINGUAL
  Filled 2024-07-10 (×3): qty 1

## 2024-07-10 MED ORDER — MORPHINE SULFATE (PF) 4 MG/ML IV SOLN
4.0000 mg | Freq: Once | INTRAVENOUS | Status: AC
  Administered 2024-07-10: 4 mg via INTRAVENOUS
  Filled 2024-07-10: qty 1

## 2024-07-10 MED ORDER — ONDANSETRON HCL 4 MG/2ML IJ SOLN
4.0000 mg | Freq: Four times a day (QID) | INTRAMUSCULAR | Status: DC | PRN
  Administered 2024-07-11 – 2024-07-13 (×3): 4 mg via INTRAVENOUS
  Filled 2024-07-10 (×3): qty 2

## 2024-07-10 MED ORDER — SACUBITRIL-VALSARTAN 24-26 MG PO TABS
1.0000 | ORAL_TABLET | Freq: Two times a day (BID) | ORAL | Status: DC
  Administered 2024-07-11 – 2024-07-14 (×7): 1 via ORAL
  Filled 2024-07-10 (×9): qty 1

## 2024-07-10 MED ORDER — TRAZODONE HCL 50 MG PO TABS
50.0000 mg | ORAL_TABLET | Freq: Every evening | ORAL | Status: DC | PRN
  Administered 2024-07-11 – 2024-07-13 (×2): 50 mg via ORAL
  Filled 2024-07-10 (×2): qty 1

## 2024-07-10 MED ORDER — DIPHENHYDRAMINE HCL 50 MG/ML IJ SOLN: 12.5000 mg | Freq: Three times a day (TID) | INTRAMUSCULAR | Status: DC | PRN

## 2024-07-10 MED ORDER — VANCOMYCIN HCL 1250 MG/250ML IV SOLN
1250.0000 mg | INTRAVENOUS | Status: DC
  Administered 2024-07-11: 1250 mg via INTRAVENOUS
  Filled 2024-07-10 (×2): qty 250

## 2024-07-10 MED ORDER — SODIUM CHLORIDE 0.9 % IV BOLUS
250.0000 mL | Freq: Once | INTRAVENOUS | Status: AC
  Administered 2024-07-10: 250 mL via INTRAVENOUS

## 2024-07-10 MED ORDER — OXYCODONE-ACETAMINOPHEN 5-325 MG PO TABS
1.0000 | ORAL_TABLET | ORAL | Status: DC | PRN
  Administered 2024-07-11 – 2024-07-14 (×14): 1 via ORAL
  Filled 2024-07-10 (×15): qty 1

## 2024-07-10 MED ORDER — ALBUTEROL SULFATE (2.5 MG/3ML) 0.083% IN NEBU: 3.0000 mL | INHALATION_SOLUTION | RESPIRATORY_TRACT | Status: DC | PRN

## 2024-07-10 MED ORDER — SODIUM CHLORIDE 0.9 % IV SOLN
2.0000 g | INTRAVENOUS | Status: DC
  Administered 2024-07-11 – 2024-07-12 (×2): 2 g via INTRAVENOUS
  Filled 2024-07-10 (×2): qty 20

## 2024-07-10 MED ORDER — SODIUM CHLORIDE 0.9 % IV SOLN
2.0000 g | Freq: Once | INTRAVENOUS | Status: AC
  Administered 2024-07-10: 2 g via INTRAVENOUS
  Filled 2024-07-10: qty 20

## 2024-07-10 MED ORDER — ONDANSETRON HCL 4 MG/2ML IJ SOLN
4.0000 mg | Freq: Once | INTRAMUSCULAR | Status: AC
  Administered 2024-07-10: 4 mg via INTRAVENOUS
  Filled 2024-07-10: qty 2

## 2024-07-10 MED ORDER — HYDRALAZINE HCL 20 MG/ML IJ SOLN: 5.0000 mg | INTRAMUSCULAR | Status: DC | PRN

## 2024-07-10 MED ORDER — METOPROLOL SUCCINATE ER 25 MG PO TB24
25.0000 mg | ORAL_TABLET | Freq: Every day | ORAL | Status: DC
  Administered 2024-07-11 – 2024-07-14 (×4): 25 mg via ORAL
  Filled 2024-07-10 (×4): qty 1

## 2024-07-10 MED ORDER — ACETAMINOPHEN 325 MG PO TABS: 650.0000 mg | ORAL_TABLET | Freq: Four times a day (QID) | ORAL | Status: DC | PRN

## 2024-07-10 MED ORDER — ACETAMINOPHEN 500 MG PO TABS
1000.0000 mg | ORAL_TABLET | Freq: Once | ORAL | Status: AC
  Administered 2024-07-10: 1000 mg via ORAL
  Filled 2024-07-10: qty 2

## 2024-07-10 MED ORDER — ATORVASTATIN CALCIUM 20 MG PO TABS
40.0000 mg | ORAL_TABLET | Freq: Every day | ORAL | Status: DC
  Administered 2024-07-11 – 2024-07-14 (×4): 40 mg via ORAL
  Filled 2024-07-10 (×4): qty 2

## 2024-07-10 MED ORDER — FUROSEMIDE 40 MG PO TABS
40.0000 mg | ORAL_TABLET | Freq: Every day | ORAL | Status: DC
  Administered 2024-07-11 – 2024-07-13 (×3): 40 mg via ORAL
  Filled 2024-07-10 (×3): qty 1

## 2024-07-10 MED ORDER — HYDROMORPHONE HCL 1 MG/ML IJ SOLN
1.0000 mg | Freq: Once | INTRAMUSCULAR | Status: AC
  Administered 2024-07-10: 1 mg via INTRAVENOUS
  Filled 2024-07-10: qty 1

## 2024-07-10 MED ORDER — ASPIRIN 81 MG PO CHEW
81.0000 mg | CHEWABLE_TABLET | Freq: Every day | ORAL | Status: DC
  Administered 2024-07-11 – 2024-07-14 (×4): 81 mg via ORAL
  Filled 2024-07-10 (×4): qty 1

## 2024-07-10 MED ORDER — FE FUM-VIT C-VIT B12-FA 460-60-0.01-1 MG PO CAPS
1.0000 | ORAL_CAPSULE | Freq: Two times a day (BID) | ORAL | Status: DC
  Administered 2024-07-11 – 2024-07-14 (×6): 1 via ORAL
  Filled 2024-07-10 (×8): qty 1

## 2024-07-10 MED ORDER — MORPHINE SULFATE (PF) 2 MG/ML IV SOLN
2.0000 mg | INTRAVENOUS | Status: DC | PRN
  Administered 2024-07-10 – 2024-07-12 (×9): 2 mg via INTRAVENOUS
  Filled 2024-07-10 (×10): qty 1

## 2024-07-10 MED ORDER — VANCOMYCIN HCL 2000 MG/400ML IV SOLN
2000.0000 mg | Freq: Once | INTRAVENOUS | Status: AC
  Administered 2024-07-10: 2000 mg via INTRAVENOUS
  Filled 2024-07-10: qty 400

## 2024-07-10 NOTE — Consult Note (Signed)
 Urology Consult   I have been asked to see the patient by Dr. Clarine, for evaluation and management of penile pain, right groin pain.  Chief Complaint: Penile pain and right groin pain  HPI:  Ronnie Mann is a 68 y.o. male with extremely complex urologic history.  He presents today with 24 hours of right-sided groin and penile pain, intermittent gross hematuria and dysuria.  He has been followed by multiple urologist in the past including Dr. Kassie in [place], myself, Dr. Twylla, and most recently [facility] urology.  History of multiple prior stone procedures, including open surgeries in the past.  He has a chronic left proximal ureteral stone with left renal atrophy, large left lower pole stone, right renal fullness which has been chronic, no evidence of any right ureteral stones today.  He has also not tolerated ureteral stents well in the past.  His most recent procedure was right-sided [facility]NL at [facility] by Dr. Mitch in March 2025.  Urinalysis today appears infected.  PMH: Past Medical History:  Diagnosis Date   CHF (congestive heart failure) (HCC)    Coronary artery disease 05/01/2023   PCI/DES   HFrEF (heart failure with reduced ejection fraction) (HCC) 05/01/2023   Hiatal hernia    HLD (hyperlipidemia)    HTN (hypertension)    Ischemic cardiomyopathy 05/01/2023   LVEF 30-35%   Kidney stones    Pneumothorax     Surgical History: Past Surgical History:  Procedure Laterality Date   CORONARY STENT INTERVENTION N/A 05/01/2023   Procedure: CORONARY STENT INTERVENTION;  Surgeon: Darron Deatrice LABOR, MD;  Location: [facility] INVASIVE CV LAB;  Service: Cardiovascular;  Laterality: N/A;   CYSTOSCOPY W/ URETERAL STENT PLACEMENT Right 10/12/2023   Procedure: CYSTOSCOPY WITH STENT REPLACEMENT;  Surgeon: Twylla Glendia BROCKS, MD;  Location: [facility] ORS;  Service: Urology;  Laterality: Right;   LEFT HEART CATH AND CORONARY ANGIOGRAPHY N/A 05/01/2023   Procedure: LEFT HEART CATH AND CORONARY  ANGIOGRAPHY;  Surgeon: Darron Deatrice LABOR, MD;  Location: [facility] INVASIVE CV LAB;  Service: Cardiovascular;  Laterality: N/A;    Allergies:  Allergies  Allergen Reactions   Losartan  Other (See Comments)    Chest pain    Family History: Family History  Problem Relation Age of Onset   Heart disease Father     Social History:  reports that he has quit smoking. His smoking use included cigarettes. He has never been exposed to tobacco smoke. He has never used smokeless tobacco. He reports that he does not currently use alcohol. He reports current drug use. Drug: Marijuana.   Physical Exam: BP 118/70   Pulse 68   Temp 98.1 F (36.7 C) (Oral)   Resp 18   Ht 5' 9 (1.753 m)   Wt 88.5 kg   SpO2 98%   BMI 28.80 kg/m    Constitutional: Appears uncomfortable Cardiovascular: No clubbing, cyanosis, or edema. Respiratory: Normal respiratory effort, no increased work of breathing. GI: Abdomen is soft, nontender, nondistended, no abdominal masses GU: Penile pain to any examination, not able to appreciate any urethral stones  Laboratory Data: Reviewed in epic  Pertinent Imaging: I have personally reviewed the most recent CT scan showing chronic left hydronephrosis, worse from prior with chronic left ureteral stone and large left renal stone with left renal atrophy, chronic right renal pelvis fullness, no evidence of any right ureteral stones, no bladder stones, no prostatic urethral stones.  Assessment & Plan:   Extremely complex 68 year old male with chronic pain, extensive  prior stone history requiring numerous procedures including open surgeries on his right kidney, who presents with penile pain, right groin pain.  No explanation on his CT today that would explain these findings aside from infected urine consistent with UTI.  No evidence of urethral stone on CT or my exam today, but exam limited secondary to pain.  His left-sided hydronephrosis, though worse from prior, is chronic and he  has no left-sided symptoms.   Recommendations:  -Agree with antibiotics for UTI -No indication for any surgical intervention at this time, low suspicion that chronic left hydro would be contributing to any of his right-sided groin or penile pain -Recommend follow-up with his [facility] urologist regarding chronic CT findings   Ronnie JAYSON Burnet, MD  Total time spent on the floor was 90 minutes, with greater than 50% spent in counseling and coordination of care with the patient regarding CT findings, extensive prior urologic history, lab results, treatment options, possible causes of his symptoms.  We discussed no evidence of right-sided ureteral stones or new hydronephrosis that would warrant stent placement, and regardless he has not tolerated ureteral stents well in the past  [facility] [address] [place], [place] [address] [phone]

## 2024-07-10 NOTE — ED Triage Notes (Signed)
 Patient to ED via POV for flank pain. Ongoing x2 days. States it started off on the right side and is now painful in groin. Pt reports he believes he passed part of the stone but not all. +hematuria.

## 2024-07-10 NOTE — H&P (Signed)
 History and Physical    Ronnie Mann FMW:[id number] DOB: 1956/10/23 DOA: 07/10/2024  Referring MD/NP/PA:   PCP: Bernardo Fend, DO   Patient coming from:  The patient is coming from home.     Chief Complaint: Right flank pain, hematuria, dysuria  HPI: Ronnie Mann is a 68 y.o. male with medical history significant of recurrent kidney stone, history of right hydronephrosis and right ureteral stone (s/p of stent placement), sCHF with EF 25-30%, HTN, HLD, CAD with stent, CKD-3a, anemia, hiatal hernia,  who presents with right flank pain.   Patient states that in the past 2 days, he has worsening right flank pain, which is sharp, severe, radiating to the right groin area, not aggravated or alleviated by unknown factors.  Associated with hematuria, dysuria, burning on urination and increased urinary frequency.  No fever or chills.  He states that he passed out a small piece of stone.  No chest pain, cough, SOB.  No nausea, vomiting, diarrhea or abdominal pain.  Data reviewed independently and ED Course: pt was found to have WBC 11.0, UA (turbid appearance, large amount of leukocyte, many bacteria, WBC > 50).  Temperature normal, blood pressure 112/67, heart rate 50-70s, RR 18, oxygen saturation 94% on room air.  Patient is placed in telemetry bed for outpatient.  Dr. Francisca of urology is consulted.  CT-renal stone study: 1. Severe left hydronephrosis is noted secondary to 18 mm calculus in proximal left ureter. 24 x 14 mm staghorn type calculus is noted in lower pole collecting system of left kidney. 2. Small nonobstructive calculus seen in lower pole collecting system of right kidney. 3. Stable left adrenal adenoma. 4. Cholelithiasis. 5. Large hiatal hernia. 6. Aortic atherosclerosis.   Aortic Atherosclerosis (ICD10-I70.0).    EKG:  Not done in ED, will get one.    Review of Systems:   General: no fevers, chills, no body weight gain, has fatigue HEENT: no blurry vision,  hearing changes or sore throat Respiratory: no dyspnea, coughing, wheezing CV: no chest pain, no palpitations GI: no nausea, vomiting, abdominal pain, diarrhea, constipation GU: has dysuria, burning on urination, increased urinary frequency, hematuria. Has right flank pain Ext: no leg edema Neuro: no unilateral weakness, numbness, or tingling, no vision change or hearing loss Skin: no rash, no skin tear. MSK: No muscle spasm, no deformity, no limitation of range of movement in spin Heme: No easy bruising.  Travel history: No recent long distant travel.   Allergy:  Allergies  Allergen Reactions   Losartan  Other (See Comments)    Chest pain    Past Medical History:  Diagnosis Date   CHF (congestive heart failure) (HCC)    Coronary artery disease 05/01/2023   PCI/DES   HFrEF (heart failure with reduced ejection fraction) (HCC) 05/01/2023   Hiatal hernia    HLD (hyperlipidemia)    HTN (hypertension)    Ischemic cardiomyopathy 05/01/2023   LVEF 30-35%   Kidney stones    Pneumothorax     Past Surgical History:  Procedure Laterality Date   CORONARY STENT INTERVENTION N/A 05/01/2023   Procedure: CORONARY STENT INTERVENTION;  Surgeon: Darron Deatrice LABOR, MD;  Location: [facility] INVASIVE CV LAB;  Service: Cardiovascular;  Laterality: N/A;   CYSTOSCOPY W/ URETERAL STENT PLACEMENT Right 10/12/2023   Procedure: CYSTOSCOPY WITH STENT REPLACEMENT;  Surgeon: Twylla Glendia BROCKS, MD;  Location: [facility] ORS;  Service: Urology;  Laterality: Right;   LEFT HEART CATH AND CORONARY ANGIOGRAPHY N/A 05/01/2023   Procedure: LEFT HEART CATH AND  CORONARY ANGIOGRAPHY;  Surgeon: Darron Deatrice LABOR, MD;  Location: [facility] INVASIVE CV LAB;  Service: Cardiovascular;  Laterality: N/A;    Social History:  reports that he has quit smoking. His smoking use included cigarettes. He has never been exposed to tobacco smoke. He has never used smokeless tobacco. He reports that he does not currently use alcohol. He reports current  drug use. Drug: Marijuana.  Family History:  Family History  Problem Relation Age of Onset   Heart disease Father      Prior to Admission medications   Medication Sig Start Date End Date Taking? Authorizing Provider  albuterol  (VENTOLIN  HFA) 108 (90 Base) MCG/ACT inhaler Inhale 2 puffs into the lungs every 6 (six) hours as needed for wheezing or shortness of breath. 10/14/23  Yes Josette Ade, MD  aspirin  81 MG chewable tablet Chew 1 tablet (81 mg total) by mouth daily. 05/15/24  Yes Caleen Qualia, MD  atorvastatin  (LIPITOR) 40 MG tablet Take 1 tablet (40 mg total) by mouth daily. 01/08/24  Yes Hackney, Tina A, FNP  ENTRESTO  24-26 MG Take 1 tablet by mouth 2 (two) times daily. 04/05/24  Yes [provider]  furosemide  (LASIX ) 20 MG tablet Take 2 tablets (40 mg total) by mouth daily. 05/17/24 08/15/24 Yes Donette Ellouise LABOR, FNP  metoprolol  succinate (TOPROL -XL) 25 MG 24 hr tablet Take 1 tablet (25 mg total) by mouth daily. 01/08/24  Yes Hackney, Tina A, FNP  nitroGLYCERIN  (NITROSTAT ) 0.4 MG SL tablet Place 1 tablet (0.4 mg total) under the tongue every 5 (five) minutes as needed for chest pain. 03/12/24 03/12/25 Yes Hackney, Ellouise A, FNP  traZODone  (DESYREL ) 50 MG tablet Take 1 tablet (50 mg total) by mouth at bedtime as needed for sleep. 02/22/24  Yes Bernardo Fend, DO  dextromethorphan -guaiFENesin  (MUCINEX  DM) 30-600 MG 12hr tablet Take 1 tablet by mouth 2 (two) times daily as needed for cough. 05/14/24   Amin, Sumayya, MD  Fe Fum-Vit C-Vit B12-FA (TRIGELS-F FORTE) CAPS capsule Take 1 capsule by mouth 2 (two) times daily. 05/14/24   Amin, Sumayya, MD  ondansetron  (ZOFRAN -ODT) 4 MG disintegrating tablet Take 1 tablet (4 mg total) by mouth every 8 (eight) hours as needed. Patient not taking: Reported on 07/10/2024 02/22/24   Bernardo Fend, DO  solifenacin (VESICARE) 10 MG tablet Take 10 mg by mouth daily. Patient not taking: Reported on 07/10/2024    [provider]  tamsulosin   (FLOMAX ) 0.4 MG CAPS capsule Take 1 capsule (0.4 mg total) by mouth daily after supper. Patient not taking: Reported on 07/10/2024 11/06/23   Awanda Ellouise, MD    Physical Exam: Vitals:   07/10/24 2300 07/10/24 2354 07/11/24 0001 07/11/24 0100  BP: 114/71  110/62   Pulse: 70  60   Resp:  18 18 16   Temp:      TempSrc:   Axillary   SpO2: 99% 100%    Weight:      Height:       General: Not in acute distress HEENT:       Eyes: PERRL, EOMI, no jaundice       ENT: No discharge from the ears and nose, no pharynx injection, no tonsillar enlargement.        Neck: No JVD, no bruit, no mass felt. Heme: No neck lymph node enlargement. Cardiac: S1/S2, RRR, No murmurs, No gallops or rubs. Respiratory: No rales, wheezing, rhonchi or rubs. GI: Soft, nondistended, has suprapubic tenderness, no rebound pain, no organomegaly, BS present. GU: No hematuria  Ext: No pitting leg edema bilaterally. 1+DP/PT pulse bilaterally. Musculoskeletal: No joint deformities, No joint redness or warmth, no limitation of ROM in spin. Skin: No rashes.  Neuro: Alert, oriented X3, cranial nerves II-XII grossly intact, moves all extremities normally. Psych: Patient is not psychotic, no suicidal or hemocidal ideation.  Labs on Admission: I have personally reviewed following labs and imaging studies  CBC: Recent Labs  Lab 07/10/24 1343  WBC 11.0*  HGB 11.4*  HCT 33.9*  MCV 91.1  PLT 350   Basic Metabolic Panel: Recent Labs  Lab 07/10/24 1343 07/11/24 0039  NA 133*  --   K 4.3 4.2  CL 101  --   CO2 26  --   GLUCOSE 144*  --   BUN 35*  --   CREATININE 1.60*  --   CALCIUM  9.5  --   MG  --  2.1   GFR: Estimated Creatinine Clearance: 48.6 mL/min (A) (by C-G formula based on SCr of 1.6 mg/dL (H)). Liver Function Tests: No results for input(s): AST, ALT, ALKPHOS, BILITOT, PROT, ALBUMIN  in the last 168 hours. No results for input(s): LIPASE, AMYLASE in the last 168 hours. No results for  input(s): AMMONIA in the last 168 hours. Coagulation Profile: Recent Labs  Lab 07/11/24 0025  INR 1.1   Cardiac Enzymes: No results for input(s): CKTOTAL, CKMB, CKMBINDEX, TROPONINI in the last 168 hours. BNP (last 3 results) Recent Labs    12/21/23 1421  PROBNP 1,262*   HbA1C: No results for input(s): HGBA1C in the last 72 hours. CBG: No results for input(s): GLUCAP in the last 168 hours. Lipid Profile: No results for input(s): CHOL, HDL, LDLCALC, TRIG, CHOLHDL, LDLDIRECT in the last 72 hours. Thyroid Function Tests: No results for input(s): TSH, T4TOTAL, FREET4, T3FREE, THYROIDAB in the last 72 hours. Anemia Panel: No results for input(s): VITAMINB12, FOLATE, FERRITIN, TIBC, IRON , RETICCTPCT in the last 72 hours. Urine analysis:    Component Value Date/Time   COLORURINE YELLOW (A) 07/10/2024 1343   APPEARANCEUR TURBID (A) 07/10/2024 1343   APPEARANCEUR Cloudy (A) 10/23/2023 1417   LABSPEC 1.007 07/10/2024 1343   PHURINE 6.0 07/10/2024 1343   GLUCOSEU NEGATIVE 07/10/2024 1343   HGBUR LARGE (A) 07/10/2024 1343   BILIRUBINUR NEGATIVE 07/10/2024 1343   BILIRUBINUR Negative 10/23/2023 1417   KETONESUR NEGATIVE 07/10/2024 1343   PROTEINUR >=300 (A) 07/10/2024 1343   NITRITE NEGATIVE 07/10/2024 1343   LEUKOCYTESUR LARGE (A) 07/10/2024 1343   Sepsis Labs: @LABRCNTIP (procalcitonin:4,lacticidven:4) )No results found for this or any previous visit (from the past 240 hours).   Radiological Exams on Admission:   Assessment/Plan Principal Problem:   Complicated UTI (urinary tract infection) Active Problems:   Recurrent nephrolithiasis   Hydronephrosis of left kidney   Essential hypertension   HLD (hyperlipidemia)   CAD (coronary artery disease)   Chronic systolic (congestive) heart failure (HCC)   CKD stage 3a, GFR 45-59 ml/min (HCC)   Overweight (BMI 25.0-29.9)   Assessment and Plan:  Complicated UTI (urinary tract  infection) and recurrent nephrolithiasis and hydronephrosis of left kidney:  Patient has mild leukocytosis with WBC 11.0, but no fever, clinically not septic.  Hemodynamically stable.  Consulted Dr. Francisca of urology --> no indication for surgical intervention now.  Patient had history of positive culture for Staphylococcus hemolyticus which is sensitive to vancomycin .  -will place in telemetry bed for outpatient - Antibiotics: Rocephin  and vancomycin . - Follow-up urine culture - As needed morphine , Percocet, Tylenol  for pain - IV fluid: Patient received  250 cc normal saline in ED  Essential hypertension -IV hydralazine  as needed - Lasix , metoprolol , Entresto   HLD (hyperlipidemia) -Lipitor  CAD (coronary artery disease) -Aspirin , Lipitor  Chronic systolic (congestive) heart failure (HCC): 2D echo on 10/13/2023 showed EF of 25 to 30%.  Patient does not have leg edema or JVD.  No SOB.  CHF is compensated. -Continue Entresto , Lasix  - Check BNP  CKD stage 3a, GFR 45-59 ml/min [facility]): Renal function stable.  Recent baseline creatinine 1.3-1.7.  His creatinine is 1.60, BUN 35, GFR 47. -Follow-up with BMP  Overweight (BMI 25.0-29.9): Body weight 88.5 kg, BMI 28.8 - Encourage losing weight - Exercise healthy diet    DVT ppx: SCD  Code Status: Full code  Family Communication:     not done, no family member is at bed side.     Disposition Plan:  Anticipate discharge back to previous environment  Consults called:  Dr. Francisca of urolgy  Admission status and Level of care: Telemetry Medical:    for obs     Dispo: The patient is from: Home              Anticipated d/c is to: Home              Anticipated d/c date is: 1 day              Patient currently is not medically stable to d/c.    Severity of Illness:  The appropriate patient status for this patient is OBSERVATION. Observation status is judged to be reasonable and necessary in order to provide the required intensity of  service to ensure the patient's safety. The patient's presenting symptoms, physical exam findings, and initial radiographic and laboratory data in the context of their medical condition is felt to place them at decreased risk for further clinical deterioration. Furthermore, it is anticipated that the patient will be medically stable for discharge from the hospital within 2 midnights of admission.        Date of Service 07/11/2024    Caleb Exon [facility]   If 7PM-7AM, please contact night-coverage [email or web address] 07/11/2024, 1:55 AM

## 2024-07-10 NOTE — ED Provider Notes (Signed)
 [facility] Provider Note    Event Date/Time   First MD Initiated Contact with Patient 07/10/24 1559     (approximate)   History   Flank Pain  Patient to ED via POV for flank pain. Ongoing x2 days. States it started off on the right side and is now painful in groin. Pt reports he believes he passed part of the stone but not all. +hematuria.    HPI Ronnie Mann is a 68 y.o. male PMH CHF, recurrent nephrolithiasis, hypertension, CAD, hyperlipidemia, CKD presents for evaluation of flank pain - Has had about 2 days of flank pain.  Bilateral though worse on the right, radiates into groin, pain is now primarily in the groin.  Believes he passed a part of the kidney stone recently. - No fevers.  Some nausea.  No upper abdominal discomfort. - He is able to urinate though notes it is very painful and he has gross hematuria  Per chart review, patient has had nephrolithotomy and ureteral stent placement on 01/22/2024 at [facility], complicated by intraoperative pneumothorax requiring right-sided chest tube.  Was discharged home with a right ureter stent with plan to remove about 2 weeks later.   Last CTAP 02/14/24: IMPRESSION: 1. Negative for acute pulmonary embolus. 2. Interim removal of right-sided ureteral stent. Residual moderate right hydronephrosis with diffuse urothelial thickening of the right renal pelvis and ureter which appears chronic and is probably postinflammatory. Punctate distal ureteral stone on the right but does not appear to be obstructing as the ureter distal to it is also slightly dilated to the level of the bladder. 3. Moderate left hydronephrosis with large 16 x 10 mm stone in the mid left ureter. This finding is unchanged. Cortical atrophy of left kidney 4. 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 5. New focal wall thickening at the ascending colon with some pericolonic soft tissue stranding, possible focal colitis.  Correlate for any history of diarrhea and consider follow-up colonoscopy after resolution of acute symptoms. 6. Large hiatal hernia. 7. Cholelithiasis. 8. Stable left adrenal adenoma. 9. Aortic atherosclerosis.     Physical Exam   Triage Vital Signs: ED Triage Vitals  Encounter Vitals Group     BP 07/10/24 1341 (!) 142/89     Girls Systolic BP Percentile --      Girls Diastolic BP Percentile --      Boys Systolic BP Percentile --      Boys Diastolic BP Percentile --      Pulse Rate 07/10/24 1341 74     Resp 07/10/24 1341 17     Temp 07/10/24 1341 98.1 F (36.7 C)     Temp Source 07/10/24 1341 Oral     SpO2 07/10/24 1341 100 %     Weight 07/10/24 1342 195 lb (88.5 kg)     Height 07/10/24 1342 5' 9 (1.753 m)     Head Circumference --      Peak Flow --      Pain Score 07/10/24 1342 9     Pain Loc --      Pain Education --      Exclude from Growth Chart --     Most recent vital signs: Vitals:   07/10/24 1728 07/10/24 1830  BP: 118/70 112/67  Pulse: 68 (!) 53  Resp: 18 18  Temp:    SpO2: 98% 94%     General: Awake, appears very uncomfortable, writhing in pain CV:  Good peripheral perfusion. RRR, RP  2+ Resp:  Normal effort. CTAB Abd:  No distention.  Tender to palpation around suprapubic region and in bilateral lower quadrants.  Well-healed surgical scar to right abdomen. GU: Normal anatomy, no bladder stone at penile meatus.  No rash.   ED Results / Procedures / Treatments   Labs (all labs ordered are listed, but only abnormal results are displayed) Labs Reviewed  URINALYSIS, ROUTINE W REFLEX MICROSCOPIC - Abnormal; Notable for the following components:      Result Value   Color, Urine YELLOW (*)    APPearance TURBID (*)    Hgb urine dipstick LARGE (*)    Protein, ur >=300 (*)    Leukocytes,Ua LARGE (*)    Bacteria, UA MANY (*)    All other components within normal limits  BASIC METABOLIC PANEL WITH GFR - Abnormal; Notable for the following components:    Sodium 133 (*)    Glucose, Bld 144 (*)    BUN 35 (*)    Creatinine, Ser 1.60 (*)    GFR, Estimated 47 (*)    All other components within normal limits  CBC - Abnormal; Notable for the following components:   WBC 11.0 (*)    RBC 3.72 (*)    Hemoglobin 11.4 (*)    HCT 33.9 (*)    RDW 16.9 (*)    All other components within normal limits  URINE CULTURE  BASIC METABOLIC PANEL WITH GFR  CBC  PROTIME-INR  APTT  BRAIN NATRIURETIC PEPTIDE     EKG  N/a   RADIOLOGY Radiology interpreted by myself and radiology report reviewed.  Severe left hydronephrosis and left urolithiasis.  Nonobstructive right nephrolithiasis.     PROCEDURES:  Critical Care performed: Yes, see critical care procedure note(s)  .Critical Care  Performed by: Clarine Ozell LABOR, MD Authorized by: Clarine Ozell LABOR, MD   Critical care provider statement:    Critical care time (minutes):  30   Critical care was necessary to treat or prevent imminent or life-threatening deterioration of the following conditions: Obstructive infected urolithiasis.   Critical care was time spent personally by me on the following activities:  Development of treatment plan with patient or surrogate, discussions with consultants, evaluation of patient's response to treatment, examination of patient, ordering and review of laboratory studies, ordering and review of radiographic studies, ordering and performing treatments and interventions, pulse oximetry, re-evaluation of patient's condition and review of old charts   I assumed direction of critical care for this patient from another provider in my specialty: no     Care discussed with: admitting provider      MEDICATIONS ORDERED IN ED: Medications  ondansetron  (ZOFRAN ) injection 4 mg (has no administration in time range)  cefTRIAXone  (ROCEPHIN ) 2 g in sodium chloride  0.9 % 100 mL IVPB (has no administration in time range)  albuterol  (PROVENTIL ) (2.5 MG/3ML) 0.083% nebulizer  solution 3 mL (has no administration in time range)  dextromethorphan -guaiFENesin  (MUCINEX  DM) 30-600 MG per 12 hr tablet 1 tablet (has no administration in time range)  diphenhydrAMINE  (BENADRYL ) injection 12.5 mg (has no administration in time range)  hydrALAZINE  (APRESOLINE ) injection 5 mg (has no administration in time range)  acetaminophen  (TYLENOL ) tablet 650 mg (has no administration in time range)  morphine  (PF) 2 MG/ML injection 2 mg (has no administration in time range)  oxyCODONE -acetaminophen  (PERCOCET/ROXICET) 5-325 MG per tablet 1 tablet (has no administration in time range)  vancomycin  (VANCOREADY) IVPB 1250 mg/250 mL (has no administration in time range)  vancomycin  (VANCOREADY) IVPB  2000 mg/400 mL (has no administration in time range)  morphine  (PF) 4 MG/ML injection 4 mg (4 mg Intravenous Given 07/10/24 1636)  sodium chloride  0.9 % bolus 250 mL (250 mLs Intravenous New Bag/Given 07/10/24 1642)  ondansetron  (ZOFRAN ) injection 4 mg (4 mg Intravenous Given 07/10/24 1639)  ketorolac  (TORADOL ) 15 MG/ML injection 15 mg (15 mg Intravenous Given 07/10/24 1638)  acetaminophen  (TYLENOL ) tablet 1,000 mg (1,000 mg Oral Given 07/10/24 1639)  cefTRIAXone  (ROCEPHIN ) 2 g in sodium chloride  0.9 % 100 mL IVPB (0 g Intravenous Stopped 07/10/24 1726)  HYDROmorphone  (DILAUDID ) injection 1 mg (1 mg Intravenous Given 07/10/24 1724)  HYDROmorphone  (DILAUDID ) injection 1 mg (1 mg Intravenous Given 07/10/24 1923)     IMPRESSION / MDM / ASSESSMENT AND PLAN / ED COURSE  I reviewed the triage vital signs and the nursing notes.                              DDX/MDM/AP: Differential diagnosis includes, but is not limited to, recurrent urolithiasis, doubt appendicitis or diverticulitis as etiology of presentation, no evidence of hernias.  Consider concomitant UTI.  Plan: - Pain control -  labs - CT abdomen pelvis - Reassess  Patient's presentation is most consistent with acute presentation with potential  threat to life or bodily function.  The patient is on the cardiac monitor to evaluate for evidence of arrhythmia and/or significant heart rate changes.  ED course below.  Workup with mild leukocytosis, urinalysis grossly consistent with infection, CT abdomen pelvis with urolithiasis and worsening hydronephrosis.  Urolithiasis.  Somewhat unclear chronicity and does not clearly lateralized to location of pain, consider pain secondary to bladder irritation from UTI.  Has required serial rounds of pain medication here.  Evaluated by urology in person who also reviewed imaging, does not recommend procedural intervention at this time but agrees antibiotics are appropriate and that patient would likely benefit from admission for pain control.  No obvious urethral stones appreciated.  Admitted to hospitalist service.  No evidence of sepsis at this time.  Reading ceftriaxone .  Clinical Course as of 07/10/24 2014  Wed Jul 10, 2024  1600 CBC on my leukocytosis, stable anemia [MM]  1601 BMP reviewed, overall unremarkable, creatinine within baseline range [MM]  1601 Urinalysis with notable RBCs as well as WBCs, many bacteria, no squamous cells, leukocyte positive nitrite negative --concerning for possible infection in addition to underlying urolithiasis [MM]  1706 CTAP: IMPRESSION: 1. Severe left hydronephrosis is noted secondary to 18 mm calculus in proximal left ureter. 24 x 14 mm staghorn type calculus is noted in lower pole collecting system of left kidney. 2. Small nonobstructive calculus seen in lower pole collecting system of right kidney. 3. Stable left adrenal adenoma. 4. Cholelithiasis. 5. Large hiatal hernia. 6. Aortic atherosclerosis.   [MM]  1721 Urology paged [MM]  1727 Spoke w/ Dr. Francisca of urology, will evaluate imaging then patient and provide further recommendations for management [MM]  1739 Dr. Francisca reviewed imaging, notes left hydro appears chronic and patient has not done well  with stents in the past--would not recommend stents at this time nor other emergent urologic intervention.  Could consider discharge home with outpatient follow-up with his [facility] urologist and pain control versus admission for pain control here.  He will evaluate patient in ED shortly. [MM]  1838 Reevaluated, still with intractable pain.  Will give further round of Dilaudid .  Plan for admission for pain control, urology agrees  this is warranted and agrees with current antibiotic regimen. [MM]  1839 Hospitalist consult order placed [MM]    Clinical Course User Index [MM] Clarine Ozell LABOR, MD     FINAL CLINICAL IMPRESSION(S) / ED DIAGNOSES   Final diagnoses:  Urinary tract infection with hematuria, site unspecified  Intractable pain  Calculus of kidney with calculus of ureter     Rx / DC Orders   ED Discharge Orders     None        Note:  This document was prepared using Dragon voice recognition software and may include unintentional dictation errors.   Clarine Ozell LABOR, MD 07/10/24 2014

## 2024-07-10 NOTE — Consult Note (Signed)
 Pharmacy Antibiotic Note  ASSESSMENT: 68 y.o. male with PMH including complicated UTI, CKD, recurrent nephrolithiasis is presenting with UTI. Imaging shows severe left hydronephrosis secondary to 18mm stone in proximal left ureter. Patient's renal function is at upper end of baseline.   Regarding patient microbiological history, urine culture from 10/2023 grew Staph hemolyticus which was susceptible to vancomycin . Pharmacy has been consulted to manage vancomycin  dosing and patient is also receiving ceftriaxone .  PLAN: Administer vancomycin  2000mg  IV x 1 as a loading dose, followed by 1250mg  IV q24H thereafter eAUC 478, Cmax 31, Cmin 12.4 Scr 1.6, IBW, Vd 0.72 L/kg Follow up culture results to assess for antibiotic optimization. Monitor renal function to assess for any necessary antibiotic dosing changes.  Patient measurements: Height: 5' 9 (175.3 cm) Weight: 88.5 kg (195 lb) IBW/kg (Calculated) : 70.7  Vital signs: Temp: 98.1 F (36.7 C) (08/20 1341) Temp Source: Oral (08/20 1341) BP: 112/67 (08/20 1830) Pulse Rate: 53 (08/20 1830) Recent Labs  Lab 07/10/24 1343  WBC 11.0*  CREATININE 1.60*   Estimated Creatinine Clearance: 48.6 mL/min (A) (by C-G formula based on SCr of 1.6 mg/dL (H)).  Allergies: Allergies  Allergen Reactions   Losartan  Other (See Comments)    Chest pain    Antimicrobials this admission: Ceftriaxone  8/20 >> Vancomycin  8/20 >>  Dose adjustments this admission: N/A  Microbiology results: 8/20 UCx: sent  Thank you for allowing pharmacy to be a part of this patient's care.  Will M. Lenon, PharmD Clinical Pharmacist 07/10/2024 7:48 PM

## 2024-07-11 DIAGNOSIS — Z87891 Personal history of nicotine dependence: Secondary | ICD-10-CM | POA: Diagnosis not present

## 2024-07-11 DIAGNOSIS — I255 Ischemic cardiomyopathy: Secondary | ICD-10-CM | POA: Diagnosis present

## 2024-07-11 DIAGNOSIS — I21A1 Myocardial infarction type 2: Secondary | ICD-10-CM | POA: Diagnosis not present

## 2024-07-11 DIAGNOSIS — E785 Hyperlipidemia, unspecified: Secondary | ICD-10-CM | POA: Diagnosis present

## 2024-07-11 DIAGNOSIS — Z955 Presence of coronary angioplasty implant and graft: Secondary | ICD-10-CM | POA: Diagnosis not present

## 2024-07-11 DIAGNOSIS — I471 Supraventricular tachycardia, unspecified: Secondary | ICD-10-CM | POA: Diagnosis not present

## 2024-07-11 DIAGNOSIS — R1031 Right lower quadrant pain: Secondary | ICD-10-CM | POA: Diagnosis not present

## 2024-07-11 DIAGNOSIS — I10 Essential (primary) hypertension: Secondary | ICD-10-CM | POA: Diagnosis not present

## 2024-07-11 DIAGNOSIS — I2510 Atherosclerotic heart disease of native coronary artery without angina pectoris: Secondary | ICD-10-CM | POA: Diagnosis present

## 2024-07-11 DIAGNOSIS — K8020 Calculus of gallbladder without cholecystitis without obstruction: Secondary | ICD-10-CM | POA: Diagnosis present

## 2024-07-11 DIAGNOSIS — K449 Diaphragmatic hernia without obstruction or gangrene: Secondary | ICD-10-CM | POA: Diagnosis present

## 2024-07-11 DIAGNOSIS — R300 Dysuria: Secondary | ICD-10-CM | POA: Diagnosis not present

## 2024-07-11 DIAGNOSIS — I700 Atherosclerosis of aorta: Secondary | ICD-10-CM | POA: Diagnosis present

## 2024-07-11 DIAGNOSIS — D3502 Benign neoplasm of left adrenal gland: Secondary | ICD-10-CM | POA: Diagnosis present

## 2024-07-11 DIAGNOSIS — Z765 Malingerer [conscious simulation]: Secondary | ICD-10-CM | POA: Diagnosis not present

## 2024-07-11 DIAGNOSIS — N200 Calculus of kidney: Secondary | ICD-10-CM | POA: Diagnosis not present

## 2024-07-11 DIAGNOSIS — L89151 Pressure ulcer of sacral region, stage 1: Secondary | ICD-10-CM | POA: Diagnosis present

## 2024-07-11 DIAGNOSIS — E663 Overweight: Secondary | ICD-10-CM | POA: Diagnosis present

## 2024-07-11 DIAGNOSIS — F419 Anxiety disorder, unspecified: Secondary | ICD-10-CM | POA: Diagnosis present

## 2024-07-11 DIAGNOSIS — N1831 Chronic kidney disease, stage 3a: Secondary | ICD-10-CM | POA: Diagnosis present

## 2024-07-11 DIAGNOSIS — G8929 Other chronic pain: Secondary | ICD-10-CM | POA: Diagnosis present

## 2024-07-11 DIAGNOSIS — N136 Pyonephrosis: Secondary | ICD-10-CM | POA: Diagnosis present

## 2024-07-11 DIAGNOSIS — R310 Gross hematuria: Secondary | ICD-10-CM | POA: Diagnosis present

## 2024-07-11 DIAGNOSIS — N202 Calculus of kidney with calculus of ureter: Secondary | ICD-10-CM | POA: Diagnosis not present

## 2024-07-11 DIAGNOSIS — N1330 Unspecified hydronephrosis: Secondary | ICD-10-CM | POA: Diagnosis not present

## 2024-07-11 DIAGNOSIS — R079 Chest pain, unspecified: Secondary | ICD-10-CM | POA: Diagnosis not present

## 2024-07-11 DIAGNOSIS — Z7982 Long term (current) use of aspirin: Secondary | ICD-10-CM | POA: Diagnosis not present

## 2024-07-11 DIAGNOSIS — N179 Acute kidney failure, unspecified: Secondary | ICD-10-CM | POA: Diagnosis not present

## 2024-07-11 DIAGNOSIS — N390 Urinary tract infection, site not specified: Secondary | ICD-10-CM | POA: Diagnosis present

## 2024-07-11 DIAGNOSIS — I5022 Chronic systolic (congestive) heart failure: Secondary | ICD-10-CM | POA: Diagnosis present

## 2024-07-11 DIAGNOSIS — Z8249 Family history of ischemic heart disease and other diseases of the circulatory system: Secondary | ICD-10-CM | POA: Diagnosis not present

## 2024-07-11 DIAGNOSIS — I130 Hypertensive heart and chronic kidney disease with heart failure and stage 1 through stage 4 chronic kidney disease, or unspecified chronic kidney disease: Secondary | ICD-10-CM | POA: Diagnosis present

## 2024-07-11 DIAGNOSIS — N4889 Other specified disorders of penis: Secondary | ICD-10-CM | POA: Diagnosis not present

## 2024-07-11 LAB — BASIC METABOLIC PANEL WITH GFR
Anion gap: 8 (ref 5–15)
BUN: 39 mg/dL — ABNORMAL HIGH (ref 8–23)
CO2: 22 mmol/L (ref 22–32)
Calcium: 8.8 mg/dL — ABNORMAL LOW (ref 8.9–10.3)
Chloride: 105 mmol/L (ref 98–111)
Creatinine, Ser: 1.74 mg/dL — ABNORMAL HIGH (ref 0.61–1.24)
GFR, Estimated: 42 mL/min — ABNORMAL LOW (ref >60)
Glucose, Bld: 109 mg/dL — ABNORMAL HIGH (ref 70–99)
Potassium: 3.9 mmol/L (ref 3.5–5.1)
Sodium: 135 mmol/L (ref 135–145)

## 2024-07-11 LAB — APTT: aPTT: 33 s (ref 24–36)

## 2024-07-11 LAB — CBC
HCT: 31.8 % — ABNORMAL LOW (ref 39.0–52.0)
Hemoglobin: 10.4 g/dL — ABNORMAL LOW (ref 13.0–17.0)
MCH: 30.4 pg (ref 26.0–34.0)
MCHC: 32.7 g/dL (ref 30.0–36.0)
MCV: 93 fL (ref 80.0–100.0)
Platelets: 262 K/uL (ref 150–400)
RBC: 3.42 MIL/uL — ABNORMAL LOW (ref 4.22–5.81)
RDW: 17.1 % — ABNORMAL HIGH (ref 11.5–15.5)
WBC: 9.9 K/uL (ref 4.0–10.5)
nRBC: 0 % (ref 0.0–0.2)

## 2024-07-11 LAB — PROTIME-INR
INR: 1.1 (ref 0.8–1.2)
Prothrombin Time: 14.7 s (ref 11.4–15.2)

## 2024-07-11 LAB — TROPONIN I (HIGH SENSITIVITY)
Troponin I (High Sensitivity): 19 ng/L — ABNORMAL HIGH (ref <18)
Troponin I (High Sensitivity): 22 ng/L — ABNORMAL HIGH (ref <18)

## 2024-07-11 LAB — MAGNESIUM: Magnesium: 2.1 mg/dL (ref 1.7–2.4)

## 2024-07-11 LAB — POTASSIUM: Potassium: 4.2 mmol/L (ref 3.5–5.1)

## 2024-07-11 MED ORDER — HYDRALAZINE HCL 20 MG/ML IJ SOLN
10.0000 mg | INTRAMUSCULAR | Status: DC | PRN
  Administered 2024-07-11: 10 mg via INTRAVENOUS
  Filled 2024-07-11: qty 1

## 2024-07-11 MED ORDER — METHOCARBAMOL 1000 MG/10ML IJ SOLN
500.0000 mg | Freq: Once | INTRAMUSCULAR | Status: AC
  Administered 2024-07-11: 500 mg via INTRAVENOUS
  Filled 2024-07-11: qty 5

## 2024-07-11 MED ORDER — SODIUM CHLORIDE 0.9 % IV BOLUS
500.0000 mL | Freq: Once | INTRAVENOUS | Status: AC
  Administered 2024-07-11: 500 mL via INTRAVENOUS

## 2024-07-11 MED ORDER — HYDROMORPHONE HCL 1 MG/ML IJ SOLN
0.5000 mg | Freq: Once | INTRAMUSCULAR | Status: AC
  Administered 2024-07-11: 0.5 mg via INTRAVENOUS
  Filled 2024-07-11: qty 0.5

## 2024-07-11 NOTE — Progress Notes (Signed)
 Urology Inpatient Progress Note  Subjective: No acute events overnight. He is afebrile, VSS. White count normalized, 9.9.  Hemoglobin down, 10.4.  Creatinine up, 1.74.  Urine culture pending, on antibiotics as below. Today he reports worsening pain overnight.  He describes severe dysuria and penile/right groin pain.  He is spontaneously voiding red, cloudy urine.  Anti-infectives: Anti-infectives (From admission, onward)    Start     Dose/Rate Route Frequency Ordered Stop   07/11/24 2000  vancomycin  (VANCOREADY) IVPB 1250 mg/250 mL        1,250 mg 166.7 mL/hr over 90 Minutes Intravenous Every 24 hours 07/10/24 1934     07/11/24 1600  cefTRIAXone  (ROCEPHIN ) 2 g in sodium chloride  0.9 % 100 mL IVPB        2 g 200 mL/hr over 30 Minutes Intravenous Every 24 hours 07/10/24 1922     07/10/24 2000  vancomycin  (VANCOREADY) IVPB 2000 mg/400 mL        2,000 mg 200 mL/hr over 120 Minutes Intravenous  Once 07/10/24 1934 07/10/24 2325   07/10/24 1615  cefTRIAXone  (ROCEPHIN ) 2 g in sodium chloride  0.9 % 100 mL IVPB        2 g 200 mL/hr over 30 Minutes Intravenous  Once 07/10/24 1607 07/10/24 1726       Current Facility-Administered Medications  Medication Dose Route Frequency Provider Last Rate Last Admin   acetaminophen  (TYLENOL ) tablet 650 mg  650 mg Oral Q6H PRN Niu, Xilin, MD       albuterol  (PROVENTIL ) (2.5 MG/3ML) 0.083% nebulizer solution 3 mL  3 mL Inhalation Q4H PRN Niu, Xilin, MD       aspirin  chewable tablet 81 mg  81 mg Oral Daily Niu, Xilin, MD   81 mg at 07/11/24 9182   atorvastatin  (LIPITOR) tablet 40 mg  40 mg Oral Daily Niu, Xilin, MD   40 mg at 07/11/24 0816   cefTRIAXone  (ROCEPHIN ) 2 g in sodium chloride  0.9 % 100 mL IVPB  2 g Intravenous Q24H Niu, Xilin, MD       dextromethorphan -guaiFENesin  (MUCINEX  DM) 30-600 MG per 12 hr tablet 1 tablet  1 tablet Oral BID PRN Niu, Xilin, MD       diphenhydrAMINE  (BENADRYL ) injection 12.5 mg  12.5 mg Intravenous Q8H PRN Niu, Xilin, MD        Fe Fum-Vit C-Vit B12-FA (TRIGELS-F FORTE) capsule 1 capsule  1 capsule Oral BID Niu, Xilin, MD   1 capsule at 07/11/24 9182   furosemide  (LASIX ) tablet 40 mg  40 mg Oral Daily Niu, Xilin, MD   40 mg at 07/11/24 9182   hydrALAZINE  (APRESOLINE ) injection 5 mg  5 mg Intravenous Q2H PRN Niu, Xilin, MD       metoprolol  succinate (TOPROL -XL) 24 hr tablet 25 mg  25 mg Oral Daily Niu, Xilin, MD   25 mg at 07/11/24 9182   morphine  (PF) 2 MG/ML injection 2 mg  2 mg Intravenous Q4H PRN Niu, Xilin, MD   2 mg at 07/11/24 9192   nitroGLYCERIN  (NITROSTAT ) SL tablet 0.4 mg  0.4 mg Sublingual Q5 min PRN Niu, Xilin, MD       ondansetron  (ZOFRAN ) injection 4 mg  4 mg Intravenous Q6H PRN Mian, Michael A, MD       oxyCODONE -acetaminophen  (PERCOCET/ROXICET) 5-325 MG per tablet 1 tablet  1 tablet Oral Q4H PRN Niu, Xilin, MD   1 tablet at 07/11/24 0357   sacubitril -valsartan  (ENTRESTO ) 24-26 mg per tablet  1 tablet Oral BID Niu, Xilin,  MD   1 tablet at 07/11/24 0816   traZODone  (DESYREL ) tablet 50 mg  50 mg Oral QHS PRN Niu, Xilin, MD       vancomycin  [facility]) IVPB 1250 mg/250 mL  1,250 mg Intravenous Q24H Lenon Elsie HERO, [facility]         Objective: Vital signs in last 24 hours: Temp:  [97.5 F (36.4 C)-98.1 F (36.7 C)] 97.5 F (36.4 C) (08/21 0815) Pulse Rate:  [53-74] 67 (08/21 0815) Resp:  [15-20] 16 (08/21 0541) BP: (104-142)/(59-89) 115/75 (08/21 0815) SpO2:  [94 %-100 %] 97 % (08/21 0815) Weight:  [88.5 kg-88.8 kg] 88.8 kg (08/21 0400)  Intake/Output from previous day: 08/20 0701 - 08/21 0700 In: 1196 [P.O.:240; IV Piggyback:956] Out: 800 [Urine:800] Intake/Output this shift: Total I/O In: -  Out: 250 [Urine:250]  Physical Exam Vitals and nursing note reviewed.  Constitutional:      Comments: Tearful, in pain and gripping his groin  HENT:     Head: Normocephalic and atraumatic.  Pulmonary:     Effort: Pulmonary effort is normal. No respiratory distress.  Genitourinary:    Comments:  Bilateral descended testicles with nonenlarged and nontender bilateral epididymides.  Bilateral testicular atrophy.  Scrotum is intact; no erythema, fluctuance, purulence, or edema. Skin:    General: Skin is warm and dry.  Neurological:     Mental Status: He is alert and oriented to person, place, and time.    Lab Results:  Recent Labs    07/10/24 1343 07/11/24 0503  WBC 11.0* 9.9  HGB 11.4* 10.4*  HCT 33.9* 31.8*  PLT 350 262   BMET Recent Labs    07/10/24 1343 07/11/24 0039 07/11/24 0503  NA 133*  --  135  K 4.3 4.2 3.9  CL 101  --  105  CO2 26  --  22  GLUCOSE 144*  --  109*  BUN 35*  --  39*  CREATININE 1.60*  --  1.74*  CALCIUM  9.5  --  8.8*   PT/INR Recent Labs    07/11/24 0025  LABPROT 14.7  INR 1.1   Studies/Results: CT Renal Stone Study Result Date: 07/10/2024 CLINICAL DATA:  Right flank pain, hematuria. EXAM: CT ABDOMEN AND PELVIS WITHOUT CONTRAST TECHNIQUE: Multidetector CT imaging of the abdomen and pelvis was performed following the standard protocol without IV contrast. RADIATION DOSE REDUCTION: This exam was performed according to the departmental dose-optimization program which includes automated exposure control, adjustment of the mA and/or kV according to patient size and/or use of iterative reconstruction technique. COMPARISON:  February 14, 2024. FINDINGS: Lower chest: Large hiatal hernia is noted. Visualized lung bases are unremarkable. Hepatobiliary: Cholelithiasis. No biliary dilatation. No focal abnormality seen in the liver on these unenhanced images. Pancreas: Unremarkable. No pancreatic ductal dilatation or surrounding inflammatory changes. Spleen: Normal in size without focal abnormality. Adrenals/Urinary Tract: Stable left adrenal adenoma. Right adrenal gland is unremarkable. Multiple bilateral renal cysts are noted. Small calculus seen in lower pole collecting system of right kidney. Severe left hydronephrosis is noted secondary to 18 mm calculus in  proximal left ureter. 24 x 14 mm staghorn type calculus is noted in lower pole collecting system of left kidney. There is again noted some degree of cortical atrophy involving left kidney. Urinary bladder is unremarkable. Stomach/Bowel: Stomach is within normal limits. Appendix appears normal. No evidence of bowel wall thickening, distention, or inflammatory changes. Vascular/Lymphatic: Aortic atherosclerosis. No enlarged abdominal or pelvic lymph nodes. Reproductive: Prostate is unremarkable. Other: No ascites or  hernia is noted. Musculoskeletal: No acute or significant osseous findings. IMPRESSION: 1. Severe left hydronephrosis is noted secondary to 18 mm calculus in proximal left ureter. 24 x 14 mm staghorn type calculus is noted in lower pole collecting system of left kidney. 2. Small nonobstructive calculus seen in lower pole collecting system of right kidney. 3. Stable left adrenal adenoma. 4. Cholelithiasis. 5. Large hiatal hernia. 6. Aortic atherosclerosis. Aortic Atherosclerosis (ICD10-I70.0). Electronically Signed   By: Lynwood Landy Raddle M.D.   On: 07/10/2024 17:02   Assessment & Plan: 68 y.o. 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  Physical exam is not particularly impressive today.  Low suspicion for epididymitis or scrotal cellulitis/abscess.  It is possible he has a urethral stone, however I would anticipate he would be able to pass this spontaneously.  Continue antibiotics and supportive care; we will continue to follow.  Sladen Plancarte, PA-C 07/11/2024

## 2024-07-11 NOTE — Significant Event (Signed)
       CROSS COVER NOTE  NAME: Ronnie Mann MRN: [id number] DOB : [birth year] ATTENDING PHYSICIAN: Vernon Ranks, MD    Date of Service   07/11/2024   HPI/Events of Note   Nurse reports bladder spasms then chest pain   Patient has hx of NSTEMI with PCI to proximal LAD and RCA 05/01/2023, HFrEF 30-35%  and follows with heart failure clinic hear at [facility] .  Other hx HTN, CKD 3A, UTI, hyperlipidemia, nephrolithiais  PE, tobacco and alcohol use   Interventions   Assessment/Plan:    07/11/2024    8:28 PM 07/11/2024    2:33 PM 07/11/2024   10:45 AM  Vitals with BMI  Systolic 167 103 883  Diastolic 64 59 70  Pulse  68    TEMP Patient endorses central chest pain 7/10 radiating down left arm and he does have anginal symptoms at home that he takes sublingual nitroglycerin  for  He denies any relief from recent morphine  given . Does report some relief from dilaudid  given earlier today EKG reviewed by me Sinus arrhythmia with RBBB not new rate 72  Robaxin  500 IV x 1 for bladder spasm Dilauddid 0.5 mg IV x1  High sensitivity troponin q 2h x 2       Erminio LITTIE Cone NP [facility] Cross Cover 7pm-7am - check amion for availability Pager [phone]

## 2024-07-11 NOTE — Plan of Care (Signed)
   Problem: Education: Goal: Knowledge of General Education information will improve Description Including pain rating scale, medication(s)/side effects and non-pharmacologic comfort measures Outcome: Progressing

## 2024-07-11 NOTE — Care Management Obs Status (Signed)
 MEDICARE OBSERVATION STATUS NOTIFICATION   Patient Details  Name: DESTEN MANOR MRN: [id number] Date of Birth: August 23, 1956   Medicare Observation Status Notification Given:  No (did not want a copy)    [name] 07/11/2024, 12:27 PM

## 2024-07-11 NOTE — Progress Notes (Signed)
 PROGRESS NOTE    Ronnie Mann  FMW:[id number] DOB: December 29, 1955 DOA: 07/10/2024 PCP: Bernardo Fend, DO   Brief Narrative:  HPI: Ronnie Mann is a 68 y.o. male with medical history significant of recurrent kidney stone, history of right hydronephrosis and right ureteral stone (s/p of stent placement), sCHF with EF 25-30%, HTN, HLD, CAD with stent, CKD-3a, anemia, hiatal hernia,  who presents with right flank pain.    Patient states that in the past 2 days, he has worsening right flank pain, which is sharp, severe, radiating to the right groin area, not aggravated or alleviated by unknown factors.  Associated with hematuria, dysuria, burning on urination and increased urinary frequency.  No fever or chills.  He states that he passed out a small piece of stone.  No chest pain, cough, SOB.  No nausea, vomiting, diarrhea or abdominal pain.   Data reviewed independently and ED Course: pt was found to have WBC 11.0, UA (turbid appearance, large amount of leukocyte, many bacteria, WBC > 50).  Temperature normal, blood pressure 112/67, heart rate 50-70s, RR 18, oxygen saturation 94% on room air.  Patient is placed in telemetry bed for outpatient.  Dr. Francisca of urology is consulted.  Assessment & Plan:   Principal Problem:   Complicated UTI (urinary tract infection) Active Problems:   Recurrent nephrolithiasis   Hydronephrosis of left kidney   Essential hypertension   HLD (hyperlipidemia)   CAD (coronary artery disease)   Chronic systolic (congestive) heart failure (HCC)   CKD stage 3a, GFR 45-59 ml/min (HCC)   Overweight (BMI 25.0-29.9)  Complicated UTI (urinary tract infection) and recurrent nephrolithiasis and hydronephrosis of left kidney: Does not meet criteria for sepsis.  Urology on board.  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  However per urology, his hydronephrosis as well  as stones are chronic and no surgical intervention.  Patient complains of pain, mainly right flank pain and has right flank tenderness.  Also has mild left flank tenderness.  Has hematuria.  Urology recommends only treating UTI with continuing antibiotics.  We will continue that and follow culture.  IV morphine  is on board for the pain.    Essential hypertension Blood pressure controlled.  Continue home dose of metoprolol  and IV as needed hydralazine .   HLD (hyperlipidemia) -Lipitor   CAD (coronary artery disease) - Asymptomatic aspirin , Lipitor   Chronic systolic (congestive) heart failure (HCC): 2D echo on 10/13/2023 showed EF of 25 to 30%.  Patient does not have leg edema or JVD.  No SOB.  CHF is compensated. -Continue Entresto , Lasix    CKD stage 3a/3b, appears to have big range of creatinine anywhere from 1.1-1.7.  1.74 which is likely his baseline.  Continue to monitor and avoid nephrotoxic agents.    DVT prophylaxis: SCDs Start: 07/10/24 1926   Code Status: Full Code  Family Communication:  None present at bedside.  Plan of care discussed with patient in length and he/she verbalized understanding and agreed with it.  Status is: Observation The patient will require care spanning > 2 midnights and should be moved to inpatient because: Patient with significant pain   Estimated body mass index is 28.91 kg/m as calculated from the following:   Height as of this encounter: 5' 9 (1.753 m).   Weight as of this encounter: 88.8 kg.  Pressure Injury 01/31/24 Sacrum Stage 1 -  Intact skin with  non-blanchable redness of a localized area usually over a bony prominence. redness (Active)  01/31/24 1701  Location: Sacrum  Location Orientation:   Staging: Stage 1 -  Intact skin with non-blanchable redness of a localized area usually over a bony prominence.  Wound Description (Comments): redness  DO NOT USE:  Present on Admission: Yes   Nutritional Assessment: Body mass index is 28.91  kg/m.SABRA Seen by dietician.  I agree with the assessment and plan as outlined below: Nutrition Status:        . Skin Assessment: I have examined the patient's skin and I agree with the wound assessment as performed by the wound care RN as outlined below: Pressure Injury 01/31/24 Sacrum Stage 1 -  Intact skin with non-blanchable redness of a localized area usually over a bony prominence. redness (Active)  01/31/24 1701  Location: Sacrum  Location Orientation:   Staging: Stage 1 -  Intact skin with non-blanchable redness of a localized area usually over a bony prominence.  Wound Description (Comments): redness  DO NOT USE:  Present on Admission: Yes    Consultants:  Urology  Procedures:  None  Antimicrobials:  Anti-infectives (From admission, onward)    Start     Dose/Rate Route Frequency Ordered Stop   07/11/24 2000  vancomycin  (VANCOREADY) IVPB 1250 mg/250 mL        1,250 mg 166.7 mL/hr over 90 Minutes Intravenous Every 24 hours 07/10/24 1934     07/11/24 1600  cefTRIAXone  (ROCEPHIN ) 2 g in sodium chloride  0.9 % 100 mL IVPB        2 g 200 mL/hr over 30 Minutes Intravenous Every 24 hours 07/10/24 1922     07/10/24 2000  vancomycin  (VANCOREADY) IVPB 2000 mg/400 mL        2,000 mg 200 mL/hr over 120 Minutes Intravenous  Once 07/10/24 1934 07/10/24 2325   07/10/24 1615  cefTRIAXone  (ROCEPHIN ) 2 g in sodium chloride  0.9 % 100 mL IVPB        2 g 200 mL/hr over 30 Minutes Intravenous  Once 07/10/24 1607 07/10/24 1726         Subjective: Patient seen and examined, he says that his right flank pain is worse than yesterday.  Also complains of burning urination.  No other complaint.  He says that he did not have any flank pain prior to that.  He saw urologist in July of this year and is not happy with the surgery that they did on him.  He is now planning and not willing to see that urologist again.  He started crying when I did examination which indicated right CVA tenderness.  I  sent a message to the nurse to give him pain medications.  Objective: Vitals:   07/11/24 0520 07/11/24 0541 07/11/24 0815 07/11/24 0921  BP:  (!) 104/59 115/75 (!) 133/97  Pulse:  69 67 74  Resp: 20 16  17   Temp:  97.6 F (36.4 C) (!) 97.5 F (36.4 C) 98.7 F (37.1 C)  TempSrc:   Oral   SpO2:  94% 97%   Weight:      Height:        Intake/Output Summary (Last 24 hours) at 07/11/2024 1030 Last data filed at 07/11/2024 9177 Gross per 24 hour  Intake 1196.02 ml  Output 1050 ml  Net 146.02 ml   Filed Weights   07/10/24 1342 07/11/24 0400  Weight: 88.5 kg 88.8 kg    Examination:  General exam: Appears in pain after examination.  Respiratory system: Clear to auscultation. Respiratory effort normal. Cardiovascular system: S1 & S2 heard, RRR. No JVD, murmurs, rubs, gallops or clicks. No pedal edema. Gastrointestinal system: Abdomen is nondistended, soft and nontender. No organomegaly or masses felt. Normal bowel sounds heard.  Bilateral CVA tenderness, severe on the right and mild on the left Central nervous system: Alert and oriented. No focal neurological deficits. Extremities: Symmetric 5 x 5 power. Skin: No rashes, lesions or ulcers   Data Reviewed: I have personally reviewed following labs and imaging studies  CBC: Recent Labs  Lab 07/10/24 1343 07/11/24 0503  WBC 11.0* 9.9  HGB 11.4* 10.4*  HCT 33.9* 31.8*  MCV 91.1 93.0  PLT 350 262   Basic Metabolic Panel: Recent Labs  Lab 07/10/24 1343 07/11/24 0039 07/11/24 0503  NA 133*  --  135  K 4.3 4.2 3.9  CL 101  --  105  CO2 26  --  22  GLUCOSE 144*  --  109*  BUN 35*  --  39*  CREATININE 1.60*  --  1.74*  CALCIUM  9.5  --  8.8*  MG  --  2.1  --    GFR: Estimated Creatinine Clearance: 44.8 mL/min (A) (by C-G formula based on SCr of 1.74 mg/dL (H)). Liver Function Tests: No results for input(s): AST, ALT, ALKPHOS, BILITOT, PROT, ALBUMIN  in the last 168 hours. No results for input(s): LIPASE,  AMYLASE in the last 168 hours. No results for input(s): AMMONIA in the last 168 hours. Coagulation Profile: Recent Labs  Lab 07/11/24 0025  INR 1.1   Cardiac Enzymes: No results for input(s): CKTOTAL, CKMB, CKMBINDEX, TROPONINI in the last 168 hours. BNP (last 3 results) Recent Labs    12/21/23 1421  PROBNP 1,262*   HbA1C: No results for input(s): HGBA1C in the last 72 hours. CBG: No results for input(s): GLUCAP in the last 168 hours. Lipid Profile: No results for input(s): CHOL, HDL, LDLCALC, TRIG, CHOLHDL, LDLDIRECT in the last 72 hours. Thyroid Function Tests: No results for input(s): TSH, T4TOTAL, FREET4, T3FREE, THYROIDAB in the last 72 hours. Anemia Panel: No results for input(s): VITAMINB12, FOLATE, FERRITIN, TIBC, IRON , RETICCTPCT in the last 72 hours. Sepsis Labs: No results for input(s): PROCALCITON, LATICACIDVEN in the last 168 hours.  No results found for this or any previous visit (from the past 240 hours).   Radiology Studies: CT Renal Stone Study Result Date: 07/10/2024 CLINICAL DATA:  Right flank pain, hematuria. EXAM: CT ABDOMEN AND PELVIS WITHOUT CONTRAST TECHNIQUE: Multidetector CT imaging of the abdomen and pelvis was performed following the standard protocol without IV contrast. RADIATION DOSE REDUCTION: This exam was performed according to the departmental dose-optimization program which includes automated exposure control, adjustment of the mA and/or kV according to patient size and/or use of iterative reconstruction technique. COMPARISON:  February 14, 2024. FINDINGS: Lower chest: Large hiatal hernia is noted. Visualized lung bases are unremarkable. Hepatobiliary: Cholelithiasis. No biliary dilatation. No focal abnormality seen in the liver on these unenhanced images. Pancreas: Unremarkable. No pancreatic ductal dilatation or surrounding inflammatory changes. Spleen: Normal in size without focal abnormality.  Adrenals/Urinary Tract: Stable left adrenal adenoma. Right adrenal gland is unremarkable. Multiple bilateral renal cysts are noted. Small calculus seen in lower pole collecting system of right kidney. Severe left hydronephrosis is noted secondary to 18 mm calculus in proximal left ureter. 24 x 14 mm staghorn type calculus is noted in lower pole collecting system of left kidney. There is again noted some degree of cortical atrophy involving  left kidney. Urinary bladder is unremarkable. Stomach/Bowel: Stomach is within normal limits. Appendix appears normal. No evidence of bowel wall thickening, distention, or inflammatory changes. Vascular/Lymphatic: Aortic atherosclerosis. No enlarged abdominal or pelvic lymph nodes. Reproductive: Prostate is unremarkable. Other: No ascites or hernia is noted. Musculoskeletal: No acute or significant osseous findings. IMPRESSION: 1. Severe left hydronephrosis is noted secondary to 18 mm calculus in proximal left ureter. 24 x 14 mm staghorn type calculus is noted in lower pole collecting system of left kidney. 2. Small nonobstructive calculus seen in lower pole collecting system of right kidney. 3. Stable left adrenal adenoma. 4. Cholelithiasis. 5. Large hiatal hernia. 6. Aortic atherosclerosis. Aortic Atherosclerosis (ICD10-I70.0). Electronically Signed   By: Lynwood Landy Raddle M.D.   On: 07/10/2024 17:02    Scheduled Meds:  aspirin   81 mg Oral Daily   atorvastatin   40 mg Oral Daily   Fe Fum-Vit C-Vit B12-FA  1 capsule Oral BID   furosemide   40 mg Oral Daily   metoprolol  succinate  25 mg Oral Daily   sacubitril -valsartan   1 tablet Oral BID   Continuous Infusions:  cefTRIAXone  (ROCEPHIN )  IV     vancomycin        LOS: 0 days   Fredia Skeeter, MD [facility]  07/11/2024, 10:30 AM   *Please note that this is a verbal dictation therefore any spelling or grammatical errors are due to the Dragon Medical One system interpretation.  Please page via Amion and do  not message via secure chat for urgent patient care matters. Secure chat can be used for non urgent patient care matters.  How to contact the [facility] Attending or Consulting provider 7A - 7P or covering provider during after hours 7P -7A, for this patient?  Check the care team in [facility] and look for a) attending/consulting [facility] provider listed and b) the [facility] team listed. Page or secure chat 7A-7P. Log into [email or web address] and use [facility] universal password to access. If you do not have the password, please contact the hospital operator. Locate the [facility] provider you are looking for under [facility] and page to a number that you can be directly reached. If you still have difficulty reaching the provider, please page the [facility] (Director on Call) for the Hospitalists listed on amion for assistance.

## 2024-07-12 ENCOUNTER — Other Ambulatory Visit (HOSPITAL_COMMUNITY): Payer: Self-pay

## 2024-07-12 DIAGNOSIS — R300 Dysuria: Secondary | ICD-10-CM | POA: Diagnosis not present

## 2024-07-12 DIAGNOSIS — N390 Urinary tract infection, site not specified: Secondary | ICD-10-CM | POA: Diagnosis not present

## 2024-07-12 DIAGNOSIS — R310 Gross hematuria: Secondary | ICD-10-CM | POA: Diagnosis not present

## 2024-07-12 DIAGNOSIS — N4889 Other specified disorders of penis: Secondary | ICD-10-CM | POA: Diagnosis not present

## 2024-07-12 DIAGNOSIS — R1031 Right lower quadrant pain: Secondary | ICD-10-CM | POA: Diagnosis not present

## 2024-07-12 LAB — CBC WITH DIFFERENTIAL/PLATELET
Abs Immature Granulocytes: 0.02 K/uL (ref 0.00–0.07)
Basophils Absolute: 0.1 K/uL (ref 0.0–0.1)
Basophils Relative: 1 %
Eosinophils Absolute: 0.2 K/uL (ref 0.0–0.5)
Eosinophils Relative: 2 %
HCT: 33.3 % — ABNORMAL LOW (ref 39.0–52.0)
Hemoglobin: 11.1 g/dL — ABNORMAL LOW (ref 13.0–17.0)
Immature Granulocytes: 0 %
Lymphocytes Relative: 11 %
Lymphs Abs: 0.7 K/uL (ref 0.7–4.0)
MCH: 30.2 pg (ref 26.0–34.0)
MCHC: 33.3 g/dL (ref 30.0–36.0)
MCV: 90.7 fL (ref 80.0–100.0)
Monocytes Absolute: 0.4 K/uL (ref 0.1–1.0)
Monocytes Relative: 6 %
Neutro Abs: 5.6 K/uL (ref 1.7–7.7)
Neutrophils Relative %: 80 %
Platelets: 307 K/uL (ref 150–400)
RBC: 3.67 MIL/uL — ABNORMAL LOW (ref 4.22–5.81)
RDW: 17.2 % — ABNORMAL HIGH (ref 11.5–15.5)
WBC: 7 K/uL (ref 4.0–10.5)
nRBC: 0 % (ref 0.0–0.2)

## 2024-07-12 LAB — BASIC METABOLIC PANEL WITH GFR
Anion gap: 9 (ref 5–15)
Anion gap: 12 (ref 5–15)
BUN: 36 mg/dL — ABNORMAL HIGH (ref 8–23)
BUN: 35 mg/dL — ABNORMAL HIGH (ref 8–23)
CO2: 20 mmol/L — ABNORMAL LOW (ref 22–32)
CO2: 19 mmol/L — ABNORMAL LOW (ref 22–32)
Calcium: 8.9 mg/dL (ref 8.9–10.3)
Calcium: 9.1 mg/dL (ref 8.9–10.3)
Chloride: 107 mmol/L (ref 98–111)
Chloride: 106 mmol/L (ref 98–111)
Creatinine, Ser: 1.23 mg/dL (ref 0.61–1.24)
Creatinine, Ser: 1.36 mg/dL — ABNORMAL HIGH (ref 0.61–1.24)
GFR, Estimated: >60 mL/min (ref >60)
GFR, Estimated: 57 mL/min — ABNORMAL LOW (ref >60)
Glucose, Bld: 160 mg/dL — ABNORMAL HIGH (ref 70–99)
Glucose, Bld: 194 mg/dL — ABNORMAL HIGH (ref 70–99)
Potassium: 3.9 mmol/L (ref 3.5–5.1)
Potassium: 3.8 mmol/L (ref 3.5–5.1)
Sodium: 136 mmol/L (ref 135–145)
Sodium: 137 mmol/L (ref 135–145)

## 2024-07-12 LAB — MAGNESIUM: Magnesium: 2.2 mg/dL (ref 1.7–2.4)

## 2024-07-12 LAB — TROPONIN I (HIGH SENSITIVITY): Troponin I (High Sensitivity): 25 ng/L — ABNORMAL HIGH (ref <18)

## 2024-07-12 LAB — GLUCOSE, CAPILLARY: Glucose-Capillary: 185 mg/dL — ABNORMAL HIGH (ref 70–99)

## 2024-07-12 LAB — D-DIMER, QUANTITATIVE: D-Dimer, Quant: 0.85 ug{FEU}/mL — ABNORMAL HIGH (ref 0.00–0.50)

## 2024-07-12 MED ORDER — PROCHLORPERAZINE EDISYLATE 10 MG/2ML IJ SOLN
10.0000 mg | Freq: Once | INTRAMUSCULAR | Status: AC
  Administered 2024-07-12: 10 mg via INTRAVENOUS
  Filled 2024-07-12: qty 2

## 2024-07-12 MED ORDER — HYDROMORPHONE HCL 1 MG/ML IJ SOLN
0.5000 mg | Freq: Once | INTRAMUSCULAR | Status: AC
  Administered 2024-07-12: 0.5 mg via INTRAVENOUS
  Filled 2024-07-12: qty 0.5

## 2024-07-12 MED ORDER — VANCOMYCIN HCL 1500 MG/300ML IV SOLN
1500.0000 mg | INTRAVENOUS | Status: DC
  Administered 2024-07-12 – 2024-07-13 (×2): 1500 mg via INTRAVENOUS
  Filled 2024-07-12 (×2): qty 300

## 2024-07-12 MED ORDER — METHOCARBAMOL 1000 MG/10ML IJ SOLN
1000.0000 mg | Freq: Once | INTRAMUSCULAR | Status: AC
  Administered 2024-07-12: 1000 mg via INTRAVENOUS
  Filled 2024-07-12 (×2): qty 10

## 2024-07-12 NOTE — Consult Note (Addendum)
 Pharmacy Antibiotic Note  ASSESSMENT: 68 y.o. male with PMH including complicated UTI, CKD, recurrent nephrolithiasis is presenting with UTI. Imaging shows severe left hydronephrosis secondary to 18mm stone in proximal left ureter. Patient's renal function is at upper end of baseline.   Regarding patient microbiological history, urine culture from 10/2023 grew Staph hemolyticus which was susceptible to vancomycin . Pharmacy has been consulted to manage vancomycin  dosing and patient is also receiving ceftriaxone .   8/22: Patient urine culture is growing 70,000 staph aureus. Susceptibilities are pending. Per urology note, recommending about 10 days of culture appropriate therapy given baseline stone burden. Patient's renal function has improved. Scr decreased from 1.74 >> 1.23. Will adjust vancomycin  dose per new Scr.   PLAN: Increase Vancomycin  to 1500 mg q24h eAUC 456.9, Cmax 33.4, Cmin 10.3 Scr 1.23, IBW, Vd 0.72 L/kg Follow up culture results and susceptibilities to assess for antibiotic optimization. Monitor renal function to assess for any necessary antibiotic dosing changes. Monitor for signs of clinical improvement  Patient measurements: Height: 5' 9 (175.3 cm) Weight: 87.5 kg (192 lb 14.4 oz) IBW/kg (Calculated) : 70.7  Vital signs: Temp: 97.6 F (36.4 C) (08/22 0807) Temp Source: Oral (08/22 0807) BP: 117/69 (08/22 0807) Pulse Rate: 69 (08/22 0807) Recent Labs  Lab 07/10/24 1343 07/11/24 0503 07/12/24 0910  WBC 11.0* 9.9 7.0  CREATININE 1.60* 1.74* 1.23   Estimated Creatinine Clearance: 62.9 mL/min (by C-G formula based on SCr of 1.23 mg/dL).  Allergies: Allergies  Allergen Reactions   Losartan  Other (See Comments)    Chest pain    Antimicrobials this admission: Ceftriaxone  8/20 >> Vancomycin  8/20 >>  Dose adjustments this admission:  Vancomycin  1250 mg q24h >> increased to 1500 mg q24h   Microbiology results: 8/20 Ucx: 70,000 staph aureus,  susceptibilities pending    Thank you for allowing pharmacy to be a part of this patient's care.  Ransom Blanch PGY-1 Pharmacy Resident  [place] [facility]  07/12/2024 2:28 PM

## 2024-07-12 NOTE — Significant Event (Signed)
       CROSS COVER NOTE  NAME: Ronnie Mann MRN: [id number] DOB : June 06, 1956 ATTENDING PHYSICIAN: Vernon Ranks, MD    Date of Service   07/12/2024   HPI/Events of Note    Patient has 10/10 pain in his shoulder and abdomen. He has morphine  and oxycodone  on board but he says they do not work and that they gave him a 1 time dose of dilaudid  earlier and that helped. He says if not he wants to go home to hurt. He is here for a UTI.  Patient now complaining of chest pain, I gave 2 doses of nitro. He said his groin is hurting. HR is 148 bp is 148/101 map 121. About to get a EKG  Still 10/10 pain in groin after dilaudid  dose  Patient has hx of NSTEMI with PCI to proximal LAD and RCA 05/01/2023, HFrEF 30-35% and follows with heart failure clinic hear at [facility] . Other hx HTN, CKD 3A, UTI, hyperlipidemia, nephrolithiais PE, tobacco and alcohol use   Admitted with complicated UTI with recurrent nephrolithiasis and hydronephrosis of left kidney.Urology on board.  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  However per urology, his hydronephrosis as well as stones are chronic and no surgical intervention.  Per urology patient was voiding clear amber urine today Staph aureaus in isolotaed in urine.    Interventions   Assessment/Plan:    07/12/2024    9:54 PM 07/12/2024    9:32 PM 07/12/2024    8:46 PM  Vitals with BMI  Systolic 148 184 878  Diastolic 106 105 66  Pulse 146 93 75   TEMP                                                                                98.7  Patient in serious distress crying. C/O severe bladder spasm after small void amount accompanied by dry heaves that was post zofran  admin Chest pain radiates to arm like yesterday, mild improvement after SL nitroglycerin  Bladder scan 92 ml  EKG ST with LVH no STE Experienced need to void again during exam. Upon  standing side of bed patient went into SVT  147 and diaphoretic. Symptoms improved after lying back down, did not endorse increase in chest pain at time  BP stable - urine thick amber very cloudy    Transfer to 2A Aggressive pain control Robaxin  1000 mg iv for bladder spasms Compazine  10 mg IV x1 dose or refractory nausea Bmp significant to bump in creatinine but in his aseline ballpark,  - consider holding lasix  for a day or 2; patient has been refusing entresto  Orthostatic vitals in am Ddimer 0.85 - not a significant elevation Troponin trend - continues to trend up 25-230-335 - additional levels ordered, cardiology consult per day team as patient with resolution of chest pain after transfer     Erminio LITTIE Cone NP [facility] Cross Cover 7pm-7am - check amion for availability Pager [phone]

## 2024-07-12 NOTE — Care Management Important Message (Signed)
 Important Message  Patient Details  Name: Ronnie Mann MRN: [id number] Date of Birth: Feb 02, 1956   Important Message Given:  Yes - Medicare IM     [name] 07/12/2024, 1:16 PM

## 2024-07-12 NOTE — Progress Notes (Signed)
 Patient called out complaining of 10/10 shoulder and abdominal pain. This RN went to get patient morphine . When I returned to the room, patient then stated that morphine  does not work and that only dilaudid  helped his pain. He then told this RN that his groin and back was hurting at a pain rating of 10/10, This RN messaged the provider to let her know that patient did not want to take morphine  or oxycodone  due to it not working for him. Provider ordered 1 time dose of dilaudid . This RN went to give the dilaudid  then patient complained of chest pain and nausea. This RN then gave zofran  as well as 2 doses of nitroglycerin  and got a EKG and notified the provider. The provider came to bedside  and put in new orders.

## 2024-07-12 NOTE — Progress Notes (Signed)
 PROGRESS NOTE    Ronnie Mann  FMW:[id number] DOB: Feb 15, 1956 DOA: 07/10/2024 PCP: Ronnie Fend, DO   Brief Narrative:  HPI: Ronnie Mann is a 68 y.o. male with medical history significant of recurrent kidney stone, history of right hydronephrosis and right ureteral stone (s/p of stent placement), sCHF with EF 25-30%, HTN, HLD, CAD with stent, CKD-3a, anemia, hiatal hernia,  who presents with right flank pain.    Patient states that in the past 2 days, he has worsening right flank pain, which is sharp, severe, radiating to the right groin area, not aggravated or alleviated by unknown factors.  Associated with hematuria, dysuria, burning on urination and increased urinary frequency.  No fever or chills.  He states that he passed out a small piece of stone.  No chest pain, cough, SOB.  No nausea, vomiting, diarrhea or abdominal pain.   Data reviewed independently and ED Course: pt was found to have WBC 11.0, UA (turbid appearance, large amount of leukocyte, many bacteria, WBC > 50).  Temperature normal, blood pressure 112/67, heart rate 50-70s, RR 18, oxygen saturation 94% on room air.  Patient is placed in telemetry bed for outpatient.  Dr. Francisca of urology is consulted.  Assessment & Plan:   Principal Problem:   Complicated UTI (urinary tract infection) Active Problems:   Recurrent nephrolithiasis   Hydronephrosis of left kidney   Essential hypertension   HLD (hyperlipidemia)   CAD (coronary artery disease)   Chronic systolic (congestive) heart failure (HCC)   CKD stage 3a, GFR 45-59 ml/min (HCC)   Overweight (BMI 25.0-29.9)  Complicated UTI (urinary tract infection) and recurrent nephrolithiasis and hydronephrosis of left kidney: Does not meet criteria for sepsis.  Urology on board.  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  However per urology, his hydronephrosis as well as  stones are chronic and no surgical intervention was recommended.  Continues to have bilateral flank tenderness, more on the right than the left as well as hematuria.  Urology recommends only treating UTI with continuing antibiotics.  We will continue that and follow culture, so far growing only 70,000 colonies of Staph aureus in the urine.  IV morphine  as well as Percocet as needed is on board for the pain.   ?  Drug-seeking behavior: Patient appears to be requesting pain medications around-the-clock, he is on both IV morphine  and p.o. Percocet.  He is requesting to give him IV Dilaudid  which raises concern for this behavior as well.  Will be cautious with opioid medications.   Essential hypertension Blood pressure controlled.  Continue home dose of metoprolol  and IV as needed hydralazine .   HLD (hyperlipidemia) -Lipitor   CAD (coronary artery disease) - Asymptomatic aspirin , Lipitor  Chest pain: He complained of chest pain overnight, was seen by night hospitalist APP.  EKG was obtained which did not show any acute ST-T wave changes.  Troponins were checked which are slightly elevated but better than what his baseline is.  Does not appear to be true ACS.  Patient has a lot of anxiety.  Patient denied any chest pain during my encounter.   Chronic systolic (congestive) heart failure (HCC): 2D echo on 10/13/2023 showed EF of 25 to 30%.  Patient does not have leg edema or JVD.  No SOB.  CHF is compensated. -Continue Entresto , Lasix    CKD stage 3a/3b, appears to have big range of creatinine anywhere from 1.1-1.7.  1.74 which is likely his baseline.  Continue to monitor and avoid nephrotoxic agents.    DVT prophylaxis: SCDs Start: 07/10/24 1926   Code Status: Full Code  Family Communication:  None present at bedside.  Plan of care discussed with patient in length and he/she verbalized understanding and agreed with it.  Status is: Inpatient Remains inpatient appropriate because: Still with pain  and hematuria.    Estimated body mass index is 28.49 kg/m as calculated from the following:   Height as of this encounter: 5' 9 (1.753 m).   Weight as of this encounter: 87.5 kg.  Pressure Injury 01/31/24 Sacrum Stage 1 -  Intact skin with non-blanchable redness of a localized area usually over a bony prominence. redness (Active)  01/31/24 1701  Location: Sacrum  Location Orientation:   Staging: Stage 1 -  Intact skin with non-blanchable redness of a localized area usually over a bony prominence.  Wound Description (Comments): redness  DO NOT USE:  Present on Admission: Yes   Nutritional Assessment: Body mass index is 28.49 kg/m.SABRA Seen by dietician.  I agree with the assessment and plan as outlined below: Nutrition Status:        . Skin Assessment: I have examined the patient's skin and I agree with the wound assessment as performed by the wound care RN as outlined below: Pressure Injury 01/31/24 Sacrum Stage 1 -  Intact skin with non-blanchable redness of a localized area usually over a bony prominence. redness (Active)  01/31/24 1701  Location: Sacrum  Location Orientation:   Staging: Stage 1 -  Intact skin with non-blanchable redness of a localized area usually over a bony prominence.  Wound Description (Comments): redness  DO NOT USE:  Present on Admission: Yes    Consultants:  Urology  Procedures:  None  Antimicrobials:  Anti-infectives (From admission, onward)    Start     Dose/Rate Route Frequency Ordered Stop   07/11/24 2000  vancomycin  (VANCOREADY) IVPB 1250 mg/250 mL        1,250 mg 166.7 mL/hr over 90 Minutes Intravenous Every 24 hours 07/10/24 1934     07/11/24 1600  cefTRIAXone  (ROCEPHIN ) 2 g in sodium chloride  0.9 % 100 mL IVPB        2 g 200 mL/hr over 30 Minutes Intravenous Every 24 hours 07/10/24 1922     07/10/24 2000  vancomycin  (VANCOREADY) IVPB 2000 mg/400 mL        2,000 mg 200 mL/hr over 120 Minutes Intravenous  Once 07/10/24 1934  07/10/24 2325   07/10/24 1615  cefTRIAXone  (ROCEPHIN ) 2 g in sodium chloride  0.9 % 100 mL IVPB        2 g 200 mL/hr over 30 Minutes Intravenous  Once 07/10/24 1607 07/10/24 1726         Subjective: Patient seen and examined.  He continues to complain of penile pain and bilateral flank pain.  Yesterday he was complaining of right sided flank pain and today he is complaining of left-sided flank pain.  Objective: Vitals:   07/11/24 1433 07/11/24 2028 07/12/24 0443 07/12/24 0500  BP: (!) 103/59 (!) 167/64 (!) 116/59   Pulse: 68  69   Resp: 18  16   Temp: 98.2 F (36.8 C)  98.1 F (36.7 C)   TempSrc: Oral  Oral   SpO2: 99%  96%   Weight:    87.5 kg  Height:        Intake/Output Summary (Last 24 hours) at 07/12/2024 9257 Last data  filed at 07/12/2024 0600 Gross per 24 hour  Intake 839.28 ml  Output 2325 ml  Net -1485.72 ml   Filed Weights   07/10/24 1342 07/11/24 0400 07/12/24 0500  Weight: 88.5 kg 88.8 kg 87.5 kg    Examination:  General exam: Appears calm and comfortable  Respiratory system: Clear to auscultation. Respiratory effort normal. Cardiovascular system: S1 & S2 heard, RRR. No JVD, murmurs, rubs, gallops or clicks. No pedal edema. Gastrointestinal system: Abdomen is nondistended, soft and ?  Mild bilateral CVA tenderness (patient would flinch even before I would examine him for CVA tenderness). No organomegaly or masses felt. Normal bowel sounds heard. Central nervous system: Alert and oriented. No focal neurological deficits. Extremities: Symmetric 5 x 5 power. Skin: No rashes, lesions or ulcers.   Data Reviewed: I have personally reviewed following labs and imaging studies  CBC: Recent Labs  Lab 07/10/24 1343 07/11/24 0503  WBC 11.0* 9.9  HGB 11.4* 10.4*  HCT 33.9* 31.8*  MCV 91.1 93.0  PLT 350 262   Basic Metabolic Panel: Recent Labs  Lab 07/10/24 1343 07/11/24 0039 07/11/24 0503  NA 133*  --  135  K 4.3 4.2 3.9  CL 101  --  105  CO2 26  --   22  GLUCOSE 144*  --  109*  BUN 35*  --  39*  CREATININE 1.60*  --  1.74*  CALCIUM  9.5  --  8.8*  MG  --  2.1  --    GFR: Estimated Creatinine Clearance: 44.5 mL/min (A) (by C-G formula based on SCr of 1.74 mg/dL (H)). Liver Function Tests: No results for input(s): AST, ALT, ALKPHOS, BILITOT, PROT, ALBUMIN  in the last 168 hours. No results for input(s): LIPASE, AMYLASE in the last 168 hours. No results for input(s): AMMONIA in the last 168 hours. Coagulation Profile: Recent Labs  Lab 07/11/24 0025  INR 1.1   Cardiac Enzymes: No results for input(s): CKTOTAL, CKMB, CKMBINDEX, TROPONINI in the last 168 hours. BNP (last 3 results) Recent Labs    12/21/23 1421  PROBNP 1,262*   HbA1C: No results for input(s): HGBA1C in the last 72 hours. CBG: No results for input(s): GLUCAP in the last 168 hours. Lipid Profile: No results for input(s): CHOL, HDL, LDLCALC, TRIG, CHOLHDL, LDLDIRECT in the last 72 hours. Thyroid Function Tests: No results for input(s): TSH, T4TOTAL, FREET4, T3FREE, THYROIDAB in the last 72 hours. Anemia Panel: No results for input(s): VITAMINB12, FOLATE, FERRITIN, TIBC, IRON , RETICCTPCT in the last 72 hours. Sepsis Labs: No results for input(s): PROCALCITON, LATICACIDVEN in the last 168 hours.  Recent Results (from the past 240 hours)  Urine Culture (for pregnant, neutropenic or urologic patients or patients with an indwelling urinary catheter)     Status: Abnormal (Preliminary result)   Collection Time: 07/10/24  1:43 PM   Specimen: Urine, Clean Catch  Result Value Ref Range Status   Specimen Description   Final    URINE, CLEAN CATCH Performed at [facility], [address] [place], [place] [address]    Special Requests   Final    NONE Performed at [facility], [address], [place], [place] [address]    Culture (A)  Final    70,000 COLONIES/mL STAPHYLOCOCCUS  AUREUS CULTURE REINCUBATED FOR BETTER GROWTH Performed at [facility], [address], [place], [place] [address]    Report Status PENDING  Incomplete     Radiology Studies: CT Renal Stone Study Result Date: 07/10/2024 CLINICAL DATA:  Right flank pain, hematuria. EXAM: CT ABDOMEN AND PELVIS WITHOUT CONTRAST TECHNIQUE: Multidetector CT imaging of the abdomen and pelvis was performed following the standard protocol without IV contrast. RADIATION DOSE REDUCTION: This exam was performed according to the departmental dose-optimization program which includes automated exposure control, adjustment of the mA and/or kV according to patient size and/or use of iterative reconstruction technique. COMPARISON:  February 14, 2024. FINDINGS: Lower chest: Large hiatal hernia is noted. Visualized lung bases are unremarkable. Hepatobiliary: Cholelithiasis. No biliary dilatation. No focal abnormality seen in the liver on these unenhanced images. Pancreas: Unremarkable. No pancreatic ductal dilatation or surrounding inflammatory changes. Spleen: Normal in size without focal abnormality. Adrenals/Urinary Tract: Stable left adrenal adenoma. Right adrenal gland is unremarkable. Multiple bilateral renal cysts are noted. Small calculus seen in lower pole collecting system of right kidney. Severe left hydronephrosis is noted secondary to 18 mm calculus in proximal left ureter. 24 x 14 mm staghorn type calculus is noted in lower pole collecting system of left kidney. There is again noted some degree of cortical atrophy involving left kidney. Urinary bladder is unremarkable. Stomach/Bowel: Stomach is within normal limits. Appendix appears normal. No evidence of bowel wall thickening, distention, or inflammatory changes. Vascular/Lymphatic: Aortic atherosclerosis. No enlarged abdominal or pelvic lymph nodes. Reproductive: Prostate is unremarkable. Other: No ascites or hernia is noted. Musculoskeletal: No acute or significant osseous  findings. IMPRESSION: 1. Severe left hydronephrosis is noted secondary to 18 mm calculus in proximal left ureter. 24 x 14 mm staghorn type calculus is noted in lower pole collecting system of left kidney. 2. Small nonobstructive calculus seen in lower pole collecting system of right kidney. 3. Stable left adrenal adenoma. 4. Cholelithiasis. 5. Large hiatal hernia. 6. Aortic atherosclerosis. Aortic Atherosclerosis (ICD10-I70.0). Electronically Signed   By: Lynwood Landy Raddle M.D.   On: 07/10/2024 17:02    Scheduled Meds:  aspirin   81 mg Oral Daily   atorvastatin   40 mg Oral Daily   Fe Fum-Vit C-Vit B12-FA  1 capsule Oral BID   furosemide   40 mg Oral Daily   metoprolol  succinate  25 mg Oral Daily   sacubitril -valsartan   1 tablet Oral BID   Continuous Infusions:  cefTRIAXone  (ROCEPHIN )  IV 2 g (07/11/24 1839)   vancomycin  1,250 mg (07/11/24 2015)     LOS: 1 day   Fredia Skeeter, MD [facility]  07/12/2024, 7:42 AM   *Please note that this is a verbal dictation therefore any spelling or grammatical errors are due to the Dragon Medical One system interpretation.  Please page via Amion and do not message via secure chat for urgent patient care matters. Secure chat can be used for non urgent patient care matters.  How to contact the [facility] Attending or Consulting provider 7A - 7P or covering provider during after hours 7P -7A, for this patient?  Check the care team in [facility] and look for a) attending/consulting [facility] provider listed and b) the [facility] team listed. Page or secure chat 7A-7P. Log into [email or web address] and use [place]'s universal password to access. If you do not have the password, please contact the hospital operator. Locate the [facility] provider you are looking for under [facility] and page to a number that you can be directly reached. If you still have difficulty reaching the provider, please page the [facility] (Director on Call) for the Hospitalists listed on amion for assistance.

## 2024-07-12 NOTE — Progress Notes (Signed)
 Urology Inpatient Progress Note  Subjective: He reported chest pain overnight, no acute changes on EKG and troponins around baseline, ACS felt unlikely. He is afebrile, VSS. White count down today, 7.0.  Hemoglobin up, 11.1.  BMP pending. Urine culture growing Staph aureus, on antibiotics as below. Today he reports his pain is somewhat improved compared to yesterday.  He is spontaneously voiding clear, amber urine today.  Notably, he mentions that he spontaneously passed a stone last week.  Anti-infectives: Anti-infectives (From admission, onward)    Start     Dose/Rate Route Frequency Ordered Stop   07/11/24 2000  vancomycin  (VANCOREADY) IVPB 1250 mg/250 mL        1,250 mg 166.7 mL/hr over 90 Minutes Intravenous Every 24 hours 07/10/24 1934     07/11/24 1600  cefTRIAXone  (ROCEPHIN ) 2 g in sodium chloride  0.9 % 100 mL IVPB        2 g 200 mL/hr over 30 Minutes Intravenous Every 24 hours 07/10/24 1922     07/10/24 2000  vancomycin  (VANCOREADY) IVPB 2000 mg/400 mL        2,000 mg 200 mL/hr over 120 Minutes Intravenous  Once 07/10/24 1934 07/10/24 2325   07/10/24 1615  cefTRIAXone  (ROCEPHIN ) 2 g in sodium chloride  0.9 % 100 mL IVPB        2 g 200 mL/hr over 30 Minutes Intravenous  Once 07/10/24 1607 07/10/24 1726       Current Facility-Administered Medications  Medication Dose Route Frequency Provider Last Rate Last Admin   acetaminophen  (TYLENOL ) tablet 650 mg  650 mg Oral Q6H PRN Niu, Xilin, MD       albuterol  (PROVENTIL ) (2.5 MG/3ML) 0.083% nebulizer solution 3 mL  3 mL Inhalation Q4H PRN Niu, Xilin, MD       aspirin  chewable tablet 81 mg  81 mg Oral Daily Niu, Xilin, MD   81 mg at 07/12/24 0820   atorvastatin  (LIPITOR) tablet 40 mg  40 mg Oral Daily Niu, Xilin, MD   40 mg at 07/12/24 0820   cefTRIAXone  (ROCEPHIN ) 2 g in sodium chloride  0.9 % 100 mL IVPB  2 g Intravenous Q24H Niu, Xilin, MD 200 mL/hr at 07/11/24 1839 2 g at 07/11/24 1839   dextromethorphan -guaiFENesin  (MUCINEX  DM)  30-600 MG per 12 hr tablet 1 tablet  1 tablet Oral BID PRN Niu, Xilin, MD       diphenhydrAMINE  (BENADRYL ) injection 12.5 mg  12.5 mg Intravenous Q8H PRN Niu, Xilin, MD       Fe Fum-Vit C-Vit B12-FA (TRIGELS-F FORTE) capsule 1 capsule  1 capsule Oral BID Niu, Xilin, MD   1 capsule at 07/12/24 0820   furosemide  (LASIX ) tablet 40 mg  40 mg Oral Daily Niu, Xilin, MD   40 mg at 07/12/24 0820   hydrALAZINE  (APRESOLINE ) injection 10 mg  10 mg Intravenous Q2H PRN Pahwani, Ravi, MD   10 mg at 07/11/24 2028   metoprolol  succinate (TOPROL -XL) 24 hr tablet 25 mg  25 mg Oral Daily Niu, Xilin, MD   25 mg at 07/12/24 9178   morphine  (PF) 2 MG/ML injection 2 mg  2 mg Intravenous Q4H PRN Niu, Xilin, MD   2 mg at 07/12/24 0815   nitroGLYCERIN  (NITROSTAT ) SL tablet 0.4 mg  0.4 mg Sublingual Q5 min PRN Niu, Xilin, MD   0.4 mg at 07/11/24 2035   ondansetron  (ZOFRAN ) injection 4 mg  4 mg Intravenous Q6H PRN Mian, Michael A, MD   4 mg at 07/11/24 2316   oxyCODONE -acetaminophen  (  PERCOCET/ROXICET) 5-325 MG per tablet 1 tablet  1 tablet Oral Q4H PRN Niu, Xilin, MD   1 tablet at 07/12/24 0526   sacubitril -valsartan  (ENTRESTO ) 24-26 mg per tablet  1 tablet Oral BID Niu, Xilin, MD   1 tablet at 07/12/24 0820   traZODone  (DESYREL ) tablet 50 mg  50 mg Oral QHS PRN Niu, Xilin, MD   50 mg at 07/11/24 2316   vancomycin  (VANCOREADY) IVPB 1250 mg/250 mL  1,250 mg Intravenous Q24H Lenon Elsie HERO, RPH 166.7 mL/hr at 07/11/24 2015 1,250 mg at 07/11/24 2015     Objective: Vital signs in last 24 hours: Temp:  [97.6 F (36.4 C)-98.7 F (37.1 C)] 97.6 F (36.4 C) (08/22 0807) Pulse Rate:  [68-74] 69 (08/22 0807) Resp:  [11-22] 22 (08/22 0807) BP: (103-167)/(59-97) 117/69 (08/22 0807) SpO2:  [96 %-99 %] 97 % (08/22 0807) Weight:  [87.5 kg] 87.5 kg (08/22 0500)  Intake/Output from previous day: 08/21 0701 - 08/22 0700 In: 839.3 [P.O.:240; IV Piggyback:599.3] Out: 2325 [Urine:2325] Intake/Output this shift: No  intake/output data recorded.  Physical Exam Vitals and nursing note reviewed.  Constitutional:      General: He is not in acute distress.    Appearance: He is not ill-appearing, toxic-appearing or diaphoretic.  HENT:     Head: Normocephalic and atraumatic.  Pulmonary:     Effort: Pulmonary effort is normal. No respiratory distress.  Skin:    General: Skin is warm and dry.  Neurological:     Mental Status: He is alert and oriented to person, place, and time.  Psychiatric:        Mood and Affect: Mood normal.        Behavior: Behavior normal.    Lab Results:  Recent Labs    07/10/24 1343 07/11/24 0503  WBC 11.0* 9.9  HGB 11.4* 10.4*  HCT 33.9* 31.8*  PLT 350 262   BMET Recent Labs    07/10/24 1343 07/11/24 0039 07/11/24 0503  NA 133*  --  135  K 4.3 4.2 3.9  CL 101  --  105  CO2 26  --  22  GLUCOSE 144*  --  109*  BUN 35*  --  39*  CREATININE 1.60*  --  1.74*  CALCIUM  9.5  --  8.8*   PT/INR Recent Labs    07/11/24 0025  LABPROT 14.7  INR 1.1   ABG No results for input(s): PHART, HCO3 in the last 72 hours.  Invalid input(s): PCO2, PO2  Studies/Results: CT Renal Stone Study Result Date: 07/10/2024 CLINICAL DATA:  Right flank pain, hematuria. EXAM: CT ABDOMEN AND PELVIS WITHOUT CONTRAST TECHNIQUE: Multidetector CT imaging of the abdomen and pelvis was performed following the standard protocol without IV contrast. RADIATION DOSE REDUCTION: This exam was performed according to the departmental dose-optimization program which includes automated exposure control, adjustment of the mA and/or kV according to patient size and/or use of iterative reconstruction technique. COMPARISON:  February 14, 2024. FINDINGS: Lower chest: Large hiatal hernia is noted. Visualized lung bases are unremarkable. Hepatobiliary: Cholelithiasis. No biliary dilatation. No focal abnormality seen in the liver on these unenhanced images. Pancreas: Unremarkable. No pancreatic ductal  dilatation or surrounding inflammatory changes. Spleen: Normal in size without focal abnormality. Adrenals/Urinary Tract: Stable left adrenal adenoma. Right adrenal gland is unremarkable. Multiple bilateral renal cysts are noted. Small calculus seen in lower pole collecting system of right kidney. Severe left hydronephrosis is noted secondary to 18 mm calculus in proximal left ureter. 24 x 14 mm  staghorn type calculus is noted in lower pole collecting system of left kidney. There is again noted some degree of cortical atrophy involving left kidney. Urinary bladder is unremarkable. Stomach/Bowel: Stomach is within normal limits. Appendix appears normal. No evidence of bowel wall thickening, distention, or inflammatory changes. Vascular/Lymphatic: Aortic atherosclerosis. No enlarged abdominal or pelvic lymph nodes. Reproductive: Prostate is unremarkable. Other: No ascites or hernia is noted. Musculoskeletal: No acute or significant osseous findings. IMPRESSION: 1. Severe left hydronephrosis is noted secondary to 18 mm calculus in proximal left ureter. 24 x 14 mm staghorn type calculus is noted in lower pole collecting system of left kidney. 2. Small nonobstructive calculus seen in lower pole collecting system of right kidney. 3. Stable left adrenal adenoma. 4. Cholelithiasis. 5. Large hiatal hernia. 6. Aortic atherosclerosis. Aortic Atherosclerosis (ICD10-I70.0). Electronically Signed   By: Lynwood Landy Raddle M.D.   On: 07/10/2024 17:02   Assessment & Plan: 68 y.o. 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  His pain and labs are improving on empiric antibiotics.  Urine has cleared considerably since yesterday.  Recommendations: - Continue antibiotics and  supportive care - Follow cultures; recommend about 10 days of culture appropriate therapy given baseline stone burden - Outpatient follow-up with primary urologist at [facility]  Urology will sign off at this time, please contact us  with any additional concerns.  Madaline Lefeber, PA-C 07/12/2024

## 2024-07-12 NOTE — TOC Initial Note (Signed)
 Transition of Care [facility]) - Initial/Assessment Note    Patient Details  Name: Ronnie Mann MRN: [id number] Date of Birth: 1956-07-10  Transition of Care [facility]) CM/SW Contact:    Lauraine JAYSON Carpen, LCSW Phone Number: 07/12/2024, 12:22 PM  Clinical Narrative:  Readmission prevention screen complete. CSW met with patient. No family at bedside. CSW introduced role and explained that discharge planning would be discussed. PCP is Sharyle Fischer, DO. Patient drives himself to appointments. [facility]acy is [facility]. He reports previously having difficulty affording iron  pill which was $60. Pharmacist is aware. He stated last admission or the one before he was not able to afford his antibiotics. Patient lives home with his son. No home health prior to admission. He has a RW that he uses when needed. He also has a BSC and shower chair. No further concerns. CSW will continue to follow patient for support and facilitate return home once stable. Son will transport him home at discharge.                Expected Discharge Plan: Home/Self Care Barriers to Discharge: Continued Medical Work up   Patient Goals and CMS Choice            Expected Discharge Plan and Services     Post Acute Care Choice: NA Living arrangements for the past 2 months: Single Family Home                                      Prior Living Arrangements/Services Living arrangements for the past 2 months: Single Family Home Lives with:: Adult Children Patient language and need for interpreter reviewed:: Yes Do you feel safe going back to the place where you live?: Yes      Need for Family Participation in Patient Care: Yes (Comment) Care giver support system in place?: Yes (comment) Current home services: DME Criminal Activity/Legal Involvement Pertinent to Current Situation/Hospitalization: No - Comment as needed  Activities of Daily Living   ADL Screening (condition at time of admission) Independently  performs ADLs?: Yes (appropriate for developmental age) Is the patient deaf or have difficulty hearing?: No Does the patient have difficulty seeing, even when wearing glasses/contacts?: No Does the patient have difficulty concentrating, remembering, or making decisions?: No  Permission Sought/Granted                  Emotional Assessment Appearance:: Appears stated age Attitude/Demeanor/Rapport: Engaged Affect (typically observed): Appropriate, Accepting Orientation: : Oriented to Self, Oriented to Place, Oriented to  Time, Oriented to Situation Alcohol / Substance Use: Not Applicable Psych Involvement: No (comment)  Admission diagnosis:  Intractable pain [R52] Calculus of kidney with calculus of ureter [N20.2] Complicated UTI (urinary tract infection) [N39.0] Urinary tract infection with hematuria, site unspecified [N39.0, R31.9] Patient Active Problem List   Diagnosis Date Noted   Overweight (BMI 25.0-29.9) 07/10/2024   Hyperkalemia 05/14/2024   Acute on chronic combined systolic and diastolic CHF (congestive heart failure) (HCC) 05/10/2024   History of pulmonary embolus (PE) 05/10/2024   Hypokalemia 05/10/2024   Pulmonary embolism without acute cor pulmonale (HCC) 01/31/2024   History of hematuria 01/31/2024   Unstable angina (HCC) 01/30/2024   CKD stage 3a, GFR 45-59 ml/min (HCC) 01/30/2024   History of non-ST elevation myocardial infarction (NSTEMI) 11/27/2023   Prediabetes 11/27/2023   Psychophysiological insomnia 11/27/2023   Right flank pain 11/04/2023   Normocytic anemia 11/04/2023  Acute pyelonephritis 11/04/2023   Fall at home, initial encounter 11/04/2023   Cough 11/04/2023   Ureteral stone with hydronephrosis 11/04/2023   Acute bronchitis 10/11/2023   Myocardial injury 10/10/2023   HLD (hyperlipidemia) 10/10/2023   CAD (coronary artery disease) 10/10/2023   Obesity (BMI 30-39.9) 10/10/2023   Complicated UTI (urinary tract infection) 10/10/2023    Hydronephrosis 10/10/2023   Cardiomyopathy (HCC) 05/20/2023   CAD in native artery 05/20/2023   Chronic systolic (congestive) heart failure (HCC) 05/17/2023   Gross hematuria 05/16/2023   UTI symptoms 05/16/2023   Symptomatic anemia 05/16/2023   Hematuria 05/16/2023   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 heart with stable angina pectoris (HCC) 05/04/2023   Ischemic cardiomyopathy 05/03/2023   Acute hyperglycemia 05/02/2023   Acute systolic heart failure (HCC) 05/01/2023   Non-ST elevation (NSTEMI) myocardial infarction (HCC) 04/29/2023   CHF (congestive heart failure) (HCC) 04/28/2023   Substernal chest pain 04/28/2023   Recurrent nephrolithiasis 04/28/2023   Lactic acidosis 04/28/2023   AKI (acute kidney injury) (HCC) 04/28/2023   Hydronephrosis of left kidney 04/28/2023   Wheezing 04/28/2023   Essential hypertension 04/28/2023   Hiatal hernia 04/28/2023   Cholelithiasis 04/28/2023   Shortness of breath 04/28/2023   Chest pain due to myocardial ischemia 04/28/2023   Viral URI 04/28/2023   PCP:  Bernardo Fend, DO Pharmacy:   [facility] [address] [place] [address] Phone: [phone] Fax: [phone]  [facility] [address] [place] - [address] [place] [address] Phone: [phone] Fax: [phone]     Social Drivers of Health (SDOH) Social History: SDOH Screenings   Food Insecurity: No Food Insecurity (07/11/2024)  Housing: Low Risk  (07/11/2024)  Transportation Needs: No Transportation Needs (07/11/2024)  Utilities: At Risk (07/12/2024)  Alcohol Screen: Low Risk  (11/27/2023)  Depression (PHQ2-9): Low Risk  (02/22/2024)  Financial Resource Strain: Low Risk  (01/23/2024)   Received from [facility]  Social Connections: Socially Isolated (07/12/2024)  Tobacco Use: Medium Risk (07/10/2024)   SDOH Interventions:     Readmission Risk Interventions     07/12/2024   12:21 PM 11/06/2023   12:27 PM  Readmission Risk Prevention Plan  Transportation Screening Complete Complete  PCP or Specialist Appt within 3-5 Days Complete Complete  HRI or Home Care Consult  Complete  Social Work Consult for Recovery Care Planning/Counseling Complete Complete  Palliative Care Screening Not Applicable Not Applicable  Medication Review [facility]) Referral to Pharmacy

## 2024-07-12 NOTE — Plan of Care (Signed)
  Problem: Education: Goal: Knowledge of General Education information will improve Description: Including pain rating scale, medication(s)/side effects and non-pharmacologic comfort measures Outcome: Progressing   Problem: Clinical Measurements: Goal: Ability to maintain clinical measurements within normal limits will improve Outcome: Progressing   Problem: Elimination: Goal: Will not experience complications related to bowel motility Outcome: Progressing   Problem: Skin Integrity: Goal: Risk for impaired skin integrity will decrease Outcome: Progressing

## 2024-07-12 NOTE — Progress Notes (Signed)
 Patient continues to request pain medications throughout the evening. Patient was able to get comfortable enough to sleep 5 hours. Called Son Elspeth per patient request to give son updates on the evening. Personal items within reach, call light within reach

## 2024-07-12 NOTE — Progress Notes (Addendum)
   07/11/24 2000  Notify: Charge Nurse/RN  Name of Charge Nurse/RN Notified Dawn RN  Provider Notification  Provider Name/Title Jesus NP  Date Provider Notified 07/11/24  Time Provider Notified 2030  Method of Notification Page  Notification Reason Change in status  Provider response En route;See new orders  Date of Provider Response 07/11/24  Time of Provider Response 2033   Patient C/O chest pain radiating down left arm and increased GU pain. EKG completed per order, One time now medications completed and Lad to draw troponin.

## 2024-07-13 DIAGNOSIS — N200 Calculus of kidney: Secondary | ICD-10-CM | POA: Diagnosis not present

## 2024-07-13 DIAGNOSIS — I10 Essential (primary) hypertension: Secondary | ICD-10-CM | POA: Diagnosis not present

## 2024-07-13 DIAGNOSIS — N390 Urinary tract infection, site not specified: Secondary | ICD-10-CM | POA: Diagnosis not present

## 2024-07-13 DIAGNOSIS — I25118 Atherosclerotic heart disease of native coronary artery with other forms of angina pectoris: Secondary | ICD-10-CM

## 2024-07-13 DIAGNOSIS — N1330 Unspecified hydronephrosis: Secondary | ICD-10-CM | POA: Diagnosis not present

## 2024-07-13 DIAGNOSIS — R7989 Other specified abnormal findings of blood chemistry: Secondary | ICD-10-CM

## 2024-07-13 LAB — RENAL FUNCTION PANEL
Albumin: 3.1 g/dL — ABNORMAL LOW (ref 3.5–5.0)
Anion gap: 7 (ref 5–15)
BUN: 35 mg/dL — ABNORMAL HIGH (ref 8–23)
CO2: 23 mmol/L (ref 22–32)
Calcium: 8.9 mg/dL (ref 8.9–10.3)
Chloride: 109 mmol/L (ref 98–111)
Creatinine, Ser: 1.39 mg/dL — ABNORMAL HIGH (ref 0.61–1.24)
GFR, Estimated: 55 mL/min — ABNORMAL LOW (ref >60)
Glucose, Bld: 122 mg/dL — ABNORMAL HIGH (ref 70–99)
Phosphorus: 3.1 mg/dL (ref 2.5–4.6)
Potassium: 4.3 mmol/L (ref 3.5–5.1)
Sodium: 139 mmol/L (ref 135–145)

## 2024-07-13 LAB — TROPONIN I (HIGH SENSITIVITY)
Troponin I (High Sensitivity): 230 ng/L (ref <18)
Troponin I (High Sensitivity): 335 ng/L (ref <18)
Troponin I (High Sensitivity): 326 ng/L (ref <18)
Troponin I (High Sensitivity): 308 ng/L (ref <18)

## 2024-07-13 LAB — CBC
HCT: 35 % — ABNORMAL LOW (ref 39.0–52.0)
Hemoglobin: 11.2 g/dL — ABNORMAL LOW (ref 13.0–17.0)
MCH: 29.9 pg (ref 26.0–34.0)
MCHC: 32 g/dL (ref 30.0–36.0)
MCV: 93.3 fL (ref 80.0–100.0)
Platelets: 304 K/uL (ref 150–400)
RBC: 3.75 MIL/uL — ABNORMAL LOW (ref 4.22–5.81)
RDW: 17.3 % — ABNORMAL HIGH (ref 11.5–15.5)
WBC: 7.2 K/uL (ref 4.0–10.5)
nRBC: 0 % (ref 0.0–0.2)

## 2024-07-13 MED ORDER — FINASTERIDE 5 MG PO TABS
5.0000 mg | ORAL_TABLET | Freq: Every day | ORAL | Status: DC
  Administered 2024-07-13 – 2024-07-14 (×2): 5 mg via ORAL
  Filled 2024-07-13 (×2): qty 1

## 2024-07-13 MED ORDER — TAMSULOSIN HCL 0.4 MG PO CAPS
0.4000 mg | ORAL_CAPSULE | Freq: Every day | ORAL | Status: DC
  Administered 2024-07-13: 0.4 mg via ORAL
  Filled 2024-07-13: qty 1

## 2024-07-13 MED ORDER — DICYCLOMINE HCL 20 MG PO TABS
20.0000 mg | ORAL_TABLET | Freq: Once | ORAL | Status: AC
  Administered 2024-07-13: 20 mg via ORAL
  Filled 2024-07-13: qty 1

## 2024-07-13 NOTE — Assessment & Plan Note (Signed)
 Patient has transient chest pain with slight increase in troponin which peaked at 325 and started trending down overnight.  Likely demand ischemia with transient SVT with straining for urination. Chest pain resolved. - Continue with aspirin  and Lipitor -Ordered echocardiogram

## 2024-07-13 NOTE — Assessment & Plan Note (Signed)
 Left hydronephrosis Recurrent nephrolithiasis. Patient has mild leukocytosis on admission which has been resolved.  Urine cultures growing Staph aureus-pending susceptibility. Urology thinks that these changes are chronic and recommending outpatient follow-up with his own urologist. Due to worsening pain overnight urology revisited and offer a cystoscopy which he declined. -Urology added Flomax  and finasteride  -Discontinuing ceftriaxone  -Continue with vancomycin  -Follow-up final culture results -Needed close outpatient urology follow-up

## 2024-07-13 NOTE — Plan of Care (Signed)

## 2024-07-13 NOTE — Assessment & Plan Note (Signed)
 Estimated body mass index is 28.49 kg/m as calculated from the following:   Height as of this encounter: 5' 9 (1.753 m).   Weight as of this encounter: 87.5 kg.   - Encouraged weight loss with diet and exercise

## 2024-07-13 NOTE — Progress Notes (Signed)
 Progress Note   Patient: Ronnie Mann FMW:[id number] DOB: 30-Jul-1956 DOA: 07/10/2024     2 DOS: the patient was seen and examined on 07/13/2024   Brief hospital course: Partly taken from prior notes.   MAC DOWDELL is a 68 y.o. male with medical history significant of recurrent kidney stone, history of right hydronephrosis and right ureteral stone (s/p of stent placement), sCHF with EF 25-30%, HTN, HLD, CAD with stent, CKD-3a, anemia, hiatal hernia,  who presents with right flank pain for the past 3 to 4 days.  No fever or chills.  Patient does has some hematuria, dysuria and burning micturition with increased urinary frequency.   On presentation vital stable, labs with leukocytosis at 11, UA with large amount of leukocytes and many bacteria. 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   Urology was consulted, per urology these changes are chronic and there is no urgent surgical intervention needed and recommending outpatient follow-up with his own urologist at [facility].  Patient was also concern of drug-seeking behavior, keep asking for IV Dilaudid  despite getting IV morphine  and p.o. Percocet.  Urine cultures are growing Staph aureus-pending susceptibility.  8/23: Vital stable, overnight complaining of significant lower abdominal and groin area pain, developed transient SVT while straining for urination.  Some vague complaint of transient chest pain.  Troponin was checked and it was mildly elevated and peaked at 325.  Chest pain has been resolved-likely due to demand ischemia with excessive pain and straining. Another message sent to urology to revisit.  Assessment and Plan: * Complicated UTI (urinary tract infection) Left hydronephrosis Recurrent nephrolithiasis. Patient has mild leukocytosis on admission which has been resolved.  Urine cultures growing Staph aureus-pending  susceptibility. Urology thinks that these changes are chronic and recommending outpatient follow-up with his own urologist. Due to worsening pain overnight urology revisited and offer a cystoscopy which he declined. -Urology added Flomax  and finasteride  -Discontinuing ceftriaxone  -Continue with vancomycin  -Follow-up final culture results -Needed close outpatient urology follow-up  Essential hypertension Blood pressure currently within goal. -Continue home Lasix , metoprolol  and Entresto  -As needed hydralazine   CAD (coronary artery disease) Patient has transient chest pain with slight increase in troponin which peaked at 325 and started trending down overnight.  Likely demand ischemia with transient SVT with straining for urination. Chest pain resolved. - Continue with aspirin  and Lipitor -Ordered echocardiogram  Chronic systolic (congestive) heart failure (HCC) 2D echo on 10/13/2023 showed EF of 25 to 30%.  Patient does not have leg edema or JVD.  No SOB.  CHF is compensated.  BNP is 203, improved than before. -Continue Entresto , Lasix   CKD stage 3a, GFR 45-59 ml/min (HCC) Renal function seems within baseline. - Monitor renal function -Avoid nephrotoxins  HLD (hyperlipidemia) - Continue with Lipitor  Overweight (BMI 25.0-29.9) Estimated body mass index is 28.49 kg/m as calculated from the following:   Height as of this encounter: 5' 9 (1.753 m).   Weight as of this encounter: 87.5 kg.   - Encouraged weight loss with diet and exercise   Subjective: Patient was resting comfortably when seen today.  Continued to have some groin pain but stating it is better than before.  Denies any chest pain or shortness of breath.  Physical Exam: Vitals:   07/13/24 0000 07/13/24 0401 07/13/24 0843 07/13/24 1147  BP: (!) 143/68 90/68 105/60 126/76  Pulse: 91 67 65 63  Resp:  19 18  Temp: 98.4 F (36.9 C) 98.2 F (36.8 C) 97.7 F (36.5 C) 97.7 F (36.5 C)  TempSrc: Oral  Oral    SpO2: 99% 96% 98% 99%  Weight:      Height:       General.  Well-developed gentleman, in no acute distress. Pulmonary.  Lungs clear bilaterally, normal respiratory effort. CV.  Regular rate and rhythm, no JVD, rub or murmur. Abdomen.  Soft, nontender, nondistended, BS positive. CNS.  Alert and oriented .  No focal neurologic deficit. Extremities.  No edema, no cyanosis, pulses intact and symmetrical. Psychiatry.  Judgment and insight appears normal.   Data Reviewed: Prior data reviewed.  Family Communication: Discussed with patient  Disposition: Status is: Inpatient Remains inpatient appropriate because: Severity of illness  Planned Discharge Destination: Home  Time spent: 50 minutes  This record has been created using Conservation officer, historic buildings. Errors have been sought and corrected,but may not always be located. Such creation errors do not reflect on the standard of care.   Author: Amaryllis Dare, MD 07/13/2024 1:52 PM  For on call review [email or web address].

## 2024-07-13 NOTE — Assessment & Plan Note (Signed)
 Renal function seems within baseline. - Monitor renal function -Avoid nephrotoxins

## 2024-07-13 NOTE — Hospital Course (Addendum)
 Partly taken from prior notes.   Ronnie Mann is a 68 y.o. male with medical history significant of recurrent kidney stone, history of right hydronephrosis and right ureteral stone (s/p of stent placement), sCHF with EF 25-30%, HTN, HLD, CAD with stent, CKD-3a, anemia, hiatal hernia,  who presents with right flank pain for the past 3 to 4 days.  No fever or chills.  Patient does has some hematuria, dysuria and burning micturition with increased urinary frequency.   On presentation vital stable, labs with leukocytosis at 11, UA with large amount of leukocytes and many bacteria. 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   Urology was consulted, per urology these changes are chronic and there is no urgent surgical intervention needed and recommending outpatient follow-up with his own urologist at [facility].  Patient was also concern of drug-seeking behavior, keep asking for IV Dilaudid  despite getting IV morphine  and p.o. Percocet.  Urine cultures are growing Staph aureus-pending susceptibility.  8/23: Vital stable, overnight complaining of significant lower abdominal and groin area pain, developed transient SVT while straining for urination.  Some vague complaint of transient chest pain.  Troponin was checked and it was mildly elevated and peaked at 325.  Chest pain has been resolved-likely due to demand ischemia with excessive pain and straining. Another message sent to urology to revisit.

## 2024-07-13 NOTE — Assessment & Plan Note (Signed)
 2D echo on 10/13/2023 showed EF of 25 to 30%.  Patient does not have leg edema or JVD.  No SOB.  CHF is compensated.  BNP is 203, improved than before. -Continue Entresto , Lasix 

## 2024-07-13 NOTE — Assessment & Plan Note (Signed)
 Blood pressure currently within goal. -Continue home Lasix , metoprolol  and Entresto  -As needed hydralazine 

## 2024-07-13 NOTE — Assessment & Plan Note (Signed)
 Continue with core

## 2024-07-13 NOTE — Progress Notes (Signed)
 Patient transferred to 2A room 247

## 2024-07-13 NOTE — Progress Notes (Signed)
  Subjective: Urology reconsulted for dysuria requiring opiate pain medication.  Patient is a poor historian states that he has been having pain with his urination over the past week.  Said the pain is worse every time he tries to pee feels like his stream is weak.  Patient did have ureteroscopy around 1 month ago.  He states that the pain has been consistent and he feels that he has been having a stone for some time.  The CT does not reflect this  Patient not having fevers or chills.  Patient states he is having gross hematuria I was not able to visualize his urine.  Objective: Vital signs in last 24 hours: Temp:  [97.7 F (36.5 C)-98.7 F (37.1 C)] 97.7 F (36.5 C) (08/23 1147) Pulse Rate:  [63-146] 63 (08/23 1147) Resp:  [18-19] 18 (08/23 0843) BP: (90-184)/(60-106) 126/76 (08/23 1147) SpO2:  [96 %-100 %] 99 % (08/23 1147)  Intake/Output from previous day: 08/22 0701 - 08/23 0700 In: 240 [P.O.:240] Out: 1625 [Urine:1625] Intake/Output this shift: Total I/O In: 180 [P.O.:180] Out: 800 [Urine:800]  Physical Exam:  General: Alert and oriented CV: RRR Lungs: Clear Abdomen: Soft, ND, ATTP;  GU: No Foley in place no flank pain.  Lab Results: Recent Labs    07/11/24 0503 07/12/24 0910 07/13/24 0809  HGB 10.4* 11.1* 11.2*  HCT 31.8* 33.3* 35.0*   BMET Recent Labs    07/12/24 2233 07/13/24 0809  NA 137 139  K 3.8 4.3  CL 106 109  CO2 19* 23  GLUCOSE 194* 122*  BUN 35* 35*  CREATININE 1.36* 1.39*  CALCIUM  9.1 8.9     Studies/Results: No results found.  Assessment/Plan: 68 year old gentleman admitted for complex UTI.  Patient previously had a right ureteral stone status post ureteroscopy at [facility]SH.  He has a chronic left ureteral stone and chronic hydronephrosis likely nonfunctioning left kidney.  Urology was reconsulted for dysuria.  The pain is in his penis he denies any flank pain.  Differential diagnosis includes residual effects of UTI, BPH, urethral  stricture, low concern for prostatitis.  I offered cystoscopy to evaluate for urethral stricture after recent ureteroscopy patient refused concerned about pain.  This can be done as an outpatient setting.  Recommend starting tamsulosin  and finasteride  to help with BPH symptoms and possibly alleviate prostatitis and inflammation in the prostate.  Patient is emptying bladder well with PVR 68.  Since patient refused cystoscopy no other intervention warranted at this time.  Recommend discharging on tamsulosin  finasteride  patient can follow-up with primary urologist.   LOS: 2 days   Jackey Pea MD 07/13/2024, 1:31 PM [facility]

## 2024-07-14 ENCOUNTER — Inpatient Hospital Stay (HOSPITAL_COMMUNITY)
Admit: 2024-07-14 | Discharge: 2024-07-14 | Disposition: A | Discharge status: Home / Self Care | Attending: Internal Medicine | Admitting: Internal Medicine

## 2024-07-14 DIAGNOSIS — N390 Urinary tract infection, site not specified: Secondary | ICD-10-CM | POA: Diagnosis not present

## 2024-07-14 DIAGNOSIS — I5043 Acute on chronic combined systolic (congestive) and diastolic (congestive) heart failure: Secondary | ICD-10-CM

## 2024-07-14 DIAGNOSIS — N200 Calculus of kidney: Secondary | ICD-10-CM | POA: Diagnosis not present

## 2024-07-14 DIAGNOSIS — R079 Chest pain, unspecified: Secondary | ICD-10-CM | POA: Diagnosis not present

## 2024-07-14 DIAGNOSIS — N202 Calculus of kidney with calculus of ureter: Secondary | ICD-10-CM

## 2024-07-14 DIAGNOSIS — I5021 Acute systolic (congestive) heart failure: Secondary | ICD-10-CM

## 2024-07-14 DIAGNOSIS — N1330 Unspecified hydronephrosis: Secondary | ICD-10-CM | POA: Diagnosis not present

## 2024-07-14 DIAGNOSIS — R319 Hematuria, unspecified: Secondary | ICD-10-CM

## 2024-07-14 LAB — RENAL FUNCTION PANEL
Albumin: 3.3 g/dL — ABNORMAL LOW (ref 3.5–5.0)
Anion gap: 6 (ref 5–15)
BUN: 43 mg/dL — ABNORMAL HIGH (ref 8–23)
CO2: 22 mmol/L (ref 22–32)
Calcium: 9.1 mg/dL (ref 8.9–10.3)
Chloride: 110 mmol/L (ref 98–111)
Creatinine, Ser: 1.71 mg/dL — ABNORMAL HIGH (ref 0.61–1.24)
GFR, Estimated: 43 mL/min — ABNORMAL LOW (ref >60)
Glucose, Bld: 159 mg/dL — ABNORMAL HIGH (ref 70–99)
Phosphorus: 3.1 mg/dL (ref 2.5–4.6)
Potassium: 5 mmol/L (ref 3.5–5.1)
Sodium: 138 mmol/L (ref 135–145)

## 2024-07-14 MED ORDER — OXYCODONE-ACETAMINOPHEN 5-325 MG PO TABS: 1.0000 | ORAL_TABLET | ORAL | 0 refills | Status: DC | PRN

## 2024-07-14 MED ORDER — VANCOMYCIN HCL 1250 MG/250ML IV SOLN
1250.0000 mg | INTRAVENOUS | Status: DC
  Filled 2024-07-14: qty 250

## 2024-07-14 MED ORDER — FINASTERIDE 5 MG PO TABS: 5.0000 mg | ORAL_TABLET | Freq: Every day | ORAL | 2 refills | Status: AC

## 2024-07-14 MED ORDER — ALBUTEROL SULFATE HFA 108 (90 BASE) MCG/ACT IN AERS: 2.0000 | INHALATION_SPRAY | Freq: Four times a day (QID) | RESPIRATORY_TRACT | 0 refills | Status: AC | PRN

## 2024-07-14 MED ORDER — ORAL CARE MOUTH RINSE: 15.0000 mL | OROMUCOSAL | Status: DC | PRN

## 2024-07-14 MED ORDER — PERFLUTREN LIPID MICROSPHERE
1.0000 mL | INTRAVENOUS | Status: AC | PRN
  Administered 2024-07-14: 6 mL via INTRAVENOUS

## 2024-07-14 MED ORDER — TAMSULOSIN HCL 0.4 MG PO CAPS: 0.4000 mg | ORAL_CAPSULE | Freq: Every day | ORAL | 2 refills | Status: AC

## 2024-07-14 MED ORDER — SULFAMETHOXAZOLE-TRIMETHOPRIM 800-160 MG PO TABS: 1.0000 | ORAL_TABLET | Freq: Two times a day (BID) | ORAL | 0 refills | Status: AC

## 2024-07-14 NOTE — Discharge Summary (Signed)
 Physician Discharge Summary   Patient: Ronnie Mann MRN: [id number] DOB: 1956/06/22  Admit date:     07/10/2024  Discharge date: 07/14/24  Discharge Physician: Amaryllis Dare   PCP: Bernardo Fend, DO   Recommendations at discharge:  Please obtain CBC and renal function on discharge Patient was instructed to hold Lasix  for 2 to 3 days due to increasing creatinine. Please follow-up final urine culture results-susceptibility still pending. Please ensure completion of appropriate antibiotics-patient need 10-day course due to concern of complicated UTI per urology Follow-up with urology Follow-up with primary care provider  Discharge Diagnoses: Principal Problem:   Complicated UTI (urinary tract infection) Active Problems:   Hydronephrosis of left kidney   Recurrent nephrolithiasis   Essential hypertension   CAD (coronary artery disease)   Elevated troponin   Chronic systolic (congestive) heart failure (HCC)   CKD stage 3a, GFR 45-59 ml/min (HCC)   HLD (hyperlipidemia)   Overweight (BMI 25.0-29.9)   Calculus of kidney with calculus of ureter   Hospital Course: Partly taken from prior notes.   Ronnie Mann is a 68 y.o. male with medical history significant of recurrent kidney stone, history of right hydronephrosis and right ureteral stone (s/p of stent placement), sCHF with EF 25-30%, HTN, HLD, CAD with stent, CKD-3a, anemia, hiatal hernia,  who presents with right flank pain for the past 3 to 4 days.  No fever or chills.  Patient does has some hematuria, dysuria and burning micturition with increased urinary frequency.   On presentation vital stable, labs with leukocytosis at 11, UA with large amount of leukocytes and many bacteria. 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   Urology was consulted, per urology these changes are chronic and there is no urgent surgical intervention  needed and recommending outpatient follow-up with his own urologist at [facility].  Patient was also concern of drug-seeking behavior, keep asking for IV Dilaudid  despite getting IV morphine  and p.o. Percocet.  Urine cultures are growing Staph aureus-pending susceptibility.  8/23: Vital stable, overnight complaining of significant lower abdominal and groin area pain, developed transient SVT while straining for urination.  Some vague complaint of transient chest pain.  Troponin was checked and it was mildly elevated and peaked at 325.  Chest pain has been resolved-likely due to demand ischemia with excessive pain and straining.  8/24: Remained hemodynamically stable, pain with significant improvement and no new concern.  Urine culture susceptibility remained pending, patient wants to go home so he is being discharged on Bactrim  , received vancomycin  while in the hospital.  Slight increase of creatinine, I instructed patient to hold Lasix  for the next couple of days and keep himself well-hydrated, clinically appears little dry.  Urology also added finasteride  to his home Flomax  for concern of excessive straining during urination.  He was provided with some Percocet to use as needed if Tylenol  does not help for pain.  Patient was also instructed to have a close follow-up with his urologist for definitive treatment of recurrent nephrolithiasis. He will continue on current medications and need to have a close follow-up with his providers for further assistance.  Assessment and Plan: * Complicated UTI (urinary tract infection) Left hydronephrosis Recurrent nephrolithiasis. Patient has mild leukocytosis on admission which has been resolved.  Urine cultures growing Staph aureus-pending susceptibility. Urology thinks that these changes are chronic and recommending outpatient follow-up with his own urologist. Due to worsening pain overnight  urology revisited and offer a cystoscopy which he  declined. -Urology added Flomax  and finasteride  -Discontinuing ceftriaxone  -Continue with vancomycin -being discharged on Bactrim  -Follow-up final culture results -Needed close outpatient urology follow-up  Essential hypertension Blood pressure currently within goal. -Continue home Lasix , metoprolol  and Entresto -holding Lasix  for 2 to 3 days -As needed hydralazine   CAD (coronary artery disease) Patient has transient chest pain with slight increase in troponin which peaked at 325 and started trending down overnight.  Likely demand ischemia with transient SVT with straining for urination. Chest pain resolved. - Continue with aspirin  and Lipitor -Ordered echocardiogram  Chronic systolic (congestive) heart failure (HCC) 2D echo on 10/13/2023 showed EF of 25 to 30%.  Patient does not have leg edema or JVD.  No SOB.  CHF is compensated.  BNP is 203, improved than before. -Continue Entresto , Lasix   CKD stage 3a, GFR 45-59 ml/min (HCC) Developed mild AKI with creatinine now at 1.7, clinically appears dry also he was told to hold home Lasix  for next 2 to 3 days and keep himself well-hydrated. - Monitor renal function -Avoid nephrotoxins  HLD (hyperlipidemia) - Continue with Lipitor  Overweight (BMI 25.0-29.9) Estimated body mass index is 28.49 kg/m as calculated from the following:   Height as of this encounter: 5' 9 (1.753 m).   Weight as of this encounter: 87.5 kg.   - Encouraged weight loss with diet and exercise   Pain control - [place]  Controlled Substance Reporting System database was reviewed. and patient was instructed, not to drive, operate heavy machinery, perform activities at heights, swimming or participation in water activities or provide baby-sitting services while on Pain, Sleep and Anxiety Medications; until their outpatient Physician has advised to do so again. Also recommended to not to take more than prescribed Pain, Sleep and Anxiety Medications.   Consultants: Urology Procedures performed: None Disposition: Home Diet recommendation:  Discharge Diet Orders (From admission, onward)     Start     Ordered   07/14/24 0000  Diet - low sodium heart healthy        07/14/24 1301           Cardiac diet DISCHARGE MEDICATION: Allergies as of 07/14/2024       Reactions   Losartan  Other (See Comments)   Chest pain        Medication List     PAUSE taking these medications    furosemide  20 MG tablet Wait to take this until your doctor or other care provider tells you to start again. Commonly known as: LASIX  Take 2 tablets (40 mg total) by mouth daily.       STOP taking these medications    ondansetron  4 MG disintegrating tablet Commonly known as: ZOFRAN -ODT       TAKE these medications    albuterol  108 (90 Base) MCG/ACT inhaler Commonly known as: VENTOLIN  HFA Inhale 2 puffs into the lungs every 6 (six) hours as needed for wheezing or shortness of breath.   aspirin  81 MG chewable tablet Chew 1 tablet (81 mg total) by mouth daily.   atorvastatin  40 MG tablet Commonly known as: LIPITOR Take 1 tablet (40 mg total) by mouth daily.   Entresto  24-26 MG Generic drug: sacubitril -valsartan  Take 1 tablet by mouth 2 (two) times daily.   Fe Fum-Vit C-Vit B12-FA Caps capsule Commonly known as: TRIGELS-F FORTE Take 1 capsule by mouth 2 (two) times daily.   finasteride  5 MG tablet Commonly known as: PROSCAR  Take 1 tablet (5 mg total) by mouth  daily. Start taking on: July 15, 2024   metoprolol  succinate 25 MG 24 hr tablet Commonly known as: TOPROL -XL Take 1 tablet (25 mg total) by mouth daily.   Mucinex  DM 30-600 MG Tb12 Take 1 tablet by mouth 2 (two) times daily as needed for cough.   nitroGLYCERIN  0.4 MG SL tablet Commonly known as: NITROSTAT  Place 1 tablet (0.4 mg total) under the tongue every 5 (five) minutes as needed for chest pain.   oxyCODONE -acetaminophen  5-325 MG tablet Commonly known as:  PERCOCET/ROXICET Take 1 tablet by mouth every 4 (four) hours as needed for moderate pain (pain score 4-6).   solifenacin 10 MG tablet Commonly known as: VESICARE Take 10 mg by mouth daily.   sulfamethoxazole -trimethoprim  800-160 MG tablet Commonly known as: BACTRIM  DS Take 1 tablet by mouth 2 (two) times daily for 5 days.   tamsulosin  0.4 MG Caps capsule Commonly known as: FLOMAX  Take 1 capsule (0.4 mg total) by mouth daily after supper.   traZODone  50 MG tablet Commonly known as: DESYREL  Take 1 tablet (50 mg total) by mouth at bedtime as needed for sleep.        Follow-up Information     Bernardo Fend, DO. Schedule an appointment as soon as possible for a visit in 1 week(s).   Specialty: Internal Medicine Contact information: [address] [place] [address] [phone]                Discharge Exam: Fredricka Weights   07/11/24 0400 07/12/24 0500 07/14/24 0500  Weight: 88.8 kg 87.5 kg 87.2 kg   General.  Well-developed gentleman, in no acute distress. Pulmonary.  Lungs clear bilaterally, normal respiratory effort. CV.  Regular rate and rhythm, no JVD, rub or murmur. Abdomen.  Soft, nontender, nondistended, BS positive. CNS.  Alert and oriented .  No focal neurologic deficit. Extremities.  No edema, no cyanosis, pulses intact and symmetrical. Psychiatry.  Judgment and insight appears normal.   Condition at discharge: stable  The results of significant diagnostics from this hospitalization (including imaging, microbiology, ancillary and laboratory) are listed below for reference.   Imaging Studies: CT Renal Stone Study Result Date: 07/10/2024 CLINICAL DATA:  Right flank pain, hematuria. EXAM: CT ABDOMEN AND PELVIS WITHOUT CONTRAST TECHNIQUE: Multidetector CT imaging of the abdomen and pelvis was performed following the standard protocol without IV contrast. RADIATION DOSE REDUCTION: This exam was performed according to the departmental  dose-optimization program which includes automated exposure control, adjustment of the mA and/or kV according to patient size and/or use of iterative reconstruction technique. COMPARISON:  February 14, 2024. FINDINGS: Lower chest: Large hiatal hernia is noted. Visualized lung bases are unremarkable. Hepatobiliary: Cholelithiasis. No biliary dilatation. No focal abnormality seen in the liver on these unenhanced images. Pancreas: Unremarkable. No pancreatic ductal dilatation or surrounding inflammatory changes. Spleen: Normal in size without focal abnormality. Adrenals/Urinary Tract: Stable left adrenal adenoma. Right adrenal gland is unremarkable. Multiple bilateral renal cysts are noted. Small calculus seen in lower pole collecting system of right kidney. Severe left hydronephrosis is noted secondary to 18 mm calculus in proximal left ureter. 24 x 14 mm staghorn type calculus is noted in lower pole collecting system of left kidney. There is again noted some degree of cortical atrophy involving left kidney. Urinary bladder is unremarkable. Stomach/Bowel: Stomach is within normal limits. Appendix appears normal. No evidence of bowel wall thickening, distention, or inflammatory changes. Vascular/Lymphatic: Aortic atherosclerosis. No enlarged abdominal or pelvic lymph nodes. Reproductive: Prostate is unremarkable. Other: No  ascites or hernia is noted. Musculoskeletal: No acute or significant osseous findings. IMPRESSION: 1. Severe left hydronephrosis is noted secondary to 18 mm calculus in proximal left ureter. 24 x 14 mm staghorn type calculus is noted in lower pole collecting system of left kidney. 2. Small nonobstructive calculus seen in lower pole collecting system of right kidney. 3. Stable left adrenal adenoma. 4. Cholelithiasis. 5. Large hiatal hernia. 6. Aortic atherosclerosis. Aortic Atherosclerosis (ICD10-I70.0). Electronically Signed   By: Lynwood Landy Raddle M.D.   On: 07/10/2024 17:02    Microbiology: Results  for orders placed or performed during the hospital encounter of 07/10/24  Urine Culture (for pregnant, neutropenic or urologic patients or patients with an indwelling urinary catheter)     Status: Abnormal (Preliminary result)   Collection Time: 07/10/24  1:43 PM   Specimen: Urine, Clean Catch  Result Value Ref Range Status   Specimen Description   Final    URINE, CLEAN CATCH Performed at [facility], [address], [place], [place] [address]    Special Requests   Final    NONE Performed at [facility], [address] [place], [place] [address]    Culture (A)  Final    70,000 COLONIES/mL STAPHYLOCOCCUS AUREUS CONFIRMATION OF SUSCEPTIBILITIES IN PROGRESS Performed at [facility], [address] [place], [place] [address]    Report Status PENDING  Incomplete    Labs: CBC: Recent Labs  Lab 07/10/24 1343 07/11/24 0503 07/12/24 0910 07/13/24 0809  WBC 11.0* 9.9 7.0 7.2  NEUTROABS  --   --  5.6  --   HGB 11.4* 10.4* 11.1* 11.2*  HCT 33.9* 31.8* 33.3* 35.0*  MCV 91.1 93.0 90.7 93.3  PLT 350 262 307 304   Basic Metabolic Panel: Recent Labs  Lab 07/11/24 0039 07/11/24 0503 07/12/24 0910 07/12/24 2233 07/13/24 0809 07/14/24 0522  NA  --  135 136 137 139 138  K 4.2 3.9 3.9 3.8 4.3 5.0  CL  --  105 107 106 109 110  CO2  --  22 20* 19* 23 22  GLUCOSE  --  109* 160* 194* 122* 159*  BUN  --  39* 36* 35* 35* 43*  CREATININE  --  1.74* 1.23 1.36* 1.39* 1.71*  CALCIUM   --  8.8* 8.9 9.1 8.9 9.1  MG 2.1  --   --  2.2  --   --   PHOS  --   --   --   --  3.1 3.1   Liver Function Tests: Recent Labs  Lab 07/13/24 0809 07/14/24 0522  ALBUMIN  3.1* 3.3*   CBG: Recent Labs  Lab 07/12/24 2244  GLUCAP 185*    Discharge time spent: greater than 30 minutes.  This record has been created using Conservation officer, historic buildings. Errors have been sought and corrected,but may not always be located. Such creation errors do not reflect on the standard  of care.   Signed: Amaryllis Dare, MD [facility] 07/14/2024

## 2024-07-14 NOTE — Progress Notes (Signed)
  Echocardiogram 2D Echocardiogram has been performed. Definity  IV ultrasound imaging agent used on this study.  Thedora GORMAN Louder 07/14/2024, 2:58 PM

## 2024-07-14 NOTE — Consult Note (Signed)
 Pharmacy Antibiotic Note  ASSESSMENT: 68 y.o. male with PMH including complicated UTI, CKD, recurrent nephrolithiasis is presenting with UTI. Imaging shows severe left hydronephrosis secondary to 18mm stone in proximal left ureter. Patient's renal function is at upper end of baseline.   Regarding patient microbiological history, urine culture from 10/2023 grew Staph hemolyticus which was susceptible to vancomycin . Pharmacy has been consulted to manage vancomycin  dosing and patient is also receiving ceftriaxone .   8/24: Patient urine culture is growing 70,000 staph aureus. Susceptibilities are pending. Per urology note, recommending about 10 days of culture appropriate therapy given baseline stone burden. Patient's renal function has improved. Scr decreased from 1.74 >> 1.23 >> 1.71. Will adjust vancomycin  dose per new Scr.   PLAN: Increase Vancomycin  to 1250 mg q24h eAUC 514.2, Cmax 32.9, Cmin 13.8 Scr 1.71, IBW, Vd 0.72 L/kg Follow up culture results and susceptibilities to assess for antibiotic optimization. Monitor renal function to assess for any necessary antibiotic dosing changes. Monitor for signs of clinical improvement  Patient measurements: Height: 5' 9 (175.3 cm) Weight: 87.2 kg (192 lb 3.9 oz) IBW/kg (Calculated) : 70.7  Vital signs: Temp: 97.9 F (36.6 C) (08/24 0837) Temp Source: Oral (08/24 0305) BP: 116/77 (08/24 0837) Pulse Rate: 73 (08/24 0837) Recent Labs  Lab 07/11/24 0503 07/12/24 0910 07/12/24 2233 07/13/24 0809 07/14/24 0522  WBC 9.9 7.0  --  7.2  --   CREATININE 1.74* 1.23 1.36* 1.39* 1.71*   Estimated Creatinine Clearance: 45.2 mL/min (A) (by C-G formula based on SCr of 1.71 mg/dL (H)).  Allergies: Allergies  Allergen Reactions   Losartan  Other (See Comments)    Chest pain    Antimicrobials this admission: Ceftriaxone  8/20 >> Vancomycin  8/20 >>  Dose adjustments this admission:  Vancomycin  1500 mg q24h >> 1250 mg q24h d/t worsening renal  function  Microbiology results: 8/20 Ucx: 70,000 staph aureus, susceptibilities pending    Thank you for allowing pharmacy to be a part of this patient's care.  Hyde Sires A Hanley Rispoli, PharmD Clinical Pharmacist 07/14/2024 8:44 AM

## 2024-07-15 LAB — URINE CULTURE: Culture: 70000 — AB

## 2024-07-15 LAB — ECHOCARDIOGRAM COMPLETE
AR max vel: 3.24 cm2
AV Peak grad: 7.4 mmHg
Ao pk vel: 1.36 m/s
Area-P 1/2: 4.15 cm2
Height: 69 in
S' Lateral: 5.2 cm
Single Plane A4C EF: 41.1 %
Weight: 3075.86 [oz_av]

## 2024-07-26 ENCOUNTER — Ambulatory Visit: Admission: RE | Admit: 2024-07-26 | Source: Ambulatory Visit

## 2024-08-01 ENCOUNTER — Telehealth: Payer: Self-pay | Admitting: Family

## 2024-08-01 NOTE — Progress Notes (Deleted)
 Advanced Heart Failure Clinic Note    PCP: Bernardo Fend, DO  Primary Cardiologist: Evalene Lunger, MD  Chief Complaint: shortness of breath   HPI:  Ronnie Mann is a 68 y/o male with a history of NSTEMI, s/p PCI to proximal LAD and RCA on 05/01/23, HFrEF (EF 30 to 35%), previous tobacco/ alcohol use, HTN, CKD, UTI, hyperlipidemia, kidney stones since the age of 53, right uretal stone s/p stent 10/12/23, bilateral nephrolithiasis and pulmonary embolus (03/25).   Admitted 04/28/23 due to new onset of substernal vice like substernal chest pain that radiated to his left arm. He reports ongoing shortness of breath associated with this. He also was awoken from sleep with this last night and had broken out into a cold sweat. Elevated lactic acid and elevated WBC at 14.4. 1 L of IVF given. Initial troponin was 54 rose to 234. 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 Developed flash pulmonary edema during left heart cath 06/10 and was treated with nitroglycerin  drip. Diuretics held. Status post successful balloon angioplasty to the proximal LAD and drug-eluting stent placement to the proximal right coronary artery. Treated for pneumonia with antibiotics. Was in the ED 05/13/23 due to bilateral flank and suprapubic pain. CT renal was stable. Admitted 05/16/23 due to shortness of breath, bilateral flank pain radiating towards lower abdomen, more on right than left, dysuria and gross hematuria. Patient deferred urology stent placement. Able to void after foley removed. Brilinta  was switched to Plavix , aspirin  discontinued in the setting of hematuria by cardiologist    Admitted 10/10/23 due to shortness of breath, hematuria and flank pain. Started on IV lasix  for HF exacerbation. Cardiology consulted and feel respiratory symptoms more related to bronchitis. Lasix  held and solu-medrol  provided. Respiratory panel negative. Urology consulted and stent placed. Losartan  was  changed to entresto .   Admitted 11/04/23 due to right flank pain. RUQ sono showed cholelithiasis with positive sonographic Murphy's borderline thickening of gallbladder wall at fundus suspicious for acute cholecystitis.  CT renal stone study showed right ureteral stent in position, persistent calculi in right ureter, pelvis and proximal mid left ureter.  Surgery team evaluated the patient in the ED, advised HIDA scan which ruled out acute cholecystitis. Entresto , lasix  and spironolactone  held due to low BP. To follow up with [facility] urology early January 2025.  Has had 2 ED visits 01/25 due to pain from his kidney stone  Had PCNL and ureteral stent placed 01/22/24 complicated by intraoperative pneumothorax requiring right-sided chest tube placement with Interventional Radiology on 01/22/2024 at [facility]. He also received pressors intraoperatively for which IV was infiltrated with concern for right forearm necrosis. Had episode of sudden chest pain radiating to the left arm. The patient had an equivocal EKG with possible ST elevations and Troponins in the 700s. Cardiology service was consulted for their recommendations, and there was no acute intervention for cath lab. Troponins peaked later that night to the 2000s and later downtrended to 1800s. On POD4 the pigtail chest tube was removed with stable CXR, post-pull chest XR was stable.     Admitted 01/30/24 with sudden onset of chest pain localized in the left side chest radiate to the left arm-8:30 AM yesterday. He took some nitroglycerin , and slept. He had another episode of chest pain today at 9:30 AM, was described as a sharp, persistent. 10/10 in severity. Radiate to the left arm. Worsening shortness of breath where he has to sleep in the recliner. Troponins only mildly elevated  and stable. Cardiology consulted, does not think this is ACS. VQ scan pursued which shows acute pulmonary embolus (segmental in LLL and subsegmental in RLL. Heparin  drip  started. Urology consulted. Started on apixaban .   Was in the ER 02/05/24 due to gross hematuria. CT showed no new ureteral stones or any evidence of clots in the bladder. Urology consulted and said to remove the stent. Stent removed with immediate improvement of symptoms.   Most recent ED visit 02/14/24 with right chest pain/ flank pain. CT shows mild area of uncomplicated colitis in the ascending colon.   He came to office 02/19/24 to have staff place zio and he c/o chest pain. BP was low. Encouraged ER but patient refused. Spironolactone  was stopped.   Admitted 05/10/24 with shortness of breath. Has associated fatigue, chest pain (usually at night), rhinorrhea, cough, abdominal distention on right lower side. Has gained 7 pounds since discharge 3 days ago   Seen in [facility] 06/25 where ReDs was elevated and symptoms worse. Entresto  was stopped due to hypotension, furosemide  was increased to 40mg  daily. Offered IV lasix  but he deferred.   He presents today for a HF f/u visit with a chief complaint of shortness of breath. Has associated fatigue, abdominal distention/ pain, dark stools,intermittent dizziness, cloudy urine and pain where nephrostomy tube was located at. Denies chest pain, palpitations, pedal edema. He did stop his entresto  at last visit and increased his furosemide  to 40mg  daily but without any change in his symptoms.   When asked about fluid intake he says that he's drinking too much. Drinks 2-3 bottles of water and 2-3 large glasses of juice and 1 pepsi daily.   He was supposed to receive iron  infusion 05/20/24 along with IV lasix  after he called 2 days ago. Patient says that he didn't know anything about it so didn't get either treatment.   Previous cardiac studies:   Echo 04/30/23: EF 30-35% along with mild LVH and Grade II DD and moderate Ronnie.  Echo 10/13/23: EF 25-30% with moderate LAE, moderate Ronnie  LHC 05/01/23:   Prox LAD to Mid LAD lesion is 99% stenosed.   Mid LAD lesion is  60% stenosed.   1st Diag lesion is 70% stenosed.   Prox RCA lesion is 95% stenosed.   Mid RCA lesion is 30% stenosed.   RPDA lesion is 60% stenosed.   Dist LAD lesion is 99% stenosed.   A drug-eluting stent was successfully placed using a STENT ONYX FRONTIER 4.0X15.   Balloon angioplasty was performed using a BALLN [place] EUPHORA RX 2.75X20.   Post intervention, there is a 20% residual stenosis.   Post intervention, there is a 0% residual stenosis.   There is severe left ventricular systolic dysfunction.   LV end diastolic pressure is severely elevated.   There is moderate (3+) mitral regurgitation.  1.  Severe two-vessel coronary artery disease with subtotal occlusion of the proximal LAD, diffuse moderate mid LAD disease and severe distal LAD disease.  In addition, there is 95% in the proximal right coronary artery with faint right to left collaterals to the LAD.  The left circumflex has mild nonobstructive disease and OM 3 is large and reaches the apex. 2.  Severely reduced LV systolic function with an EF of 25%.  Severely elevated left ventricular end-diastolic pressure at 34 mmHg. 3.  Successful balloon angioplasty to the proximal LAD and drug-eluting stent placement to the proximal right coronary artery.   ROS: All systems negative except as listed in HPI,  PMH and Problem List.  SH:  Social History   Socioeconomic History   Marital status: Divorced    Spouse name: Not on file   Number of children: Not on file   Years of education: Not on file   Highest education level: 10th grade  Occupational History   Not on file  Tobacco Use   Smoking status: Former    Types: Cigarettes    Passive exposure: Never   Smokeless tobacco: Never  Vaping Use   Vaping status: Never Used  Substance and Sexual Activity   Alcohol use: Not Currently    Comment: former moderate drinker   Drug use: Yes    Types: Marijuana   Sexual activity: Not on file  Other Topics Concern   Not on file  Social  History Narrative   Not on file   Social Drivers of Health   Financial Resource Strain: Low Risk  (01/23/2024)   Received from [facility]   Overall Financial Resource Strain (CARDIA)    Difficulty of Paying Living Expenses: Not hard at all  Food Insecurity: No Food Insecurity (07/11/2024)   Hunger Vital Sign    Worried About Running Out of Food in the Last Year: Never true    Ran Out of Food in the Last Year: Never true  Transportation Needs: No Transportation Needs (07/11/2024)   PRAPARE - Administrator, Civil Service (Medical): No    Lack of Transportation (Non-Medical): No  Physical Activity: Not on file  Stress: Not on file  Social Connections: Socially Isolated (07/12/2024)   Social Connection and Isolation Panel    Frequency of Communication with Friends and Family: Twice a week    Frequency of Social Gatherings with Friends and Family: Never    Attends Religious Services: Never    Database administrator or Organizations: No    Attends Banker Meetings: Never    Marital Status: Divorced  Catering manager Violence: Not At Risk (07/11/2024)   Humiliation, Afraid, Rape, and Kick questionnaire    Fear of Current or Ex-Partner: No    Emotionally Abused: No    Physically Abused: No    Sexually Abused: No    FH:  Family History  Problem Relation Age of Onset   Heart disease Father     Past Medical History:  Diagnosis Date   CHF (congestive heart failure) (HCC)    Coronary artery disease 05/01/2023   PCI/DES   HFrEF (heart failure with reduced ejection fraction) (HCC) 05/01/2023   Hiatal hernia    HLD (hyperlipidemia)    HTN (hypertension)    Ischemic cardiomyopathy 05/01/2023   LVEF 30-35%   Kidney stones    Pneumothorax     Current Outpatient Medications  Medication Sig Dispense Refill   albuterol  (VENTOLIN  HFA) 108 (90 Base) MCG/ACT inhaler Inhale 2 puffs into the lungs every 6 (six) hours as needed for wheezing or shortness of  breath. 17 each 0   aspirin  81 MG chewable tablet Chew 1 tablet (81 mg total) by mouth daily. 90 tablet 2   atorvastatin  (LIPITOR) 40 MG tablet Take 1 tablet (40 mg total) by mouth daily. 90 tablet 3   dextromethorphan -guaiFENesin  (MUCINEX  DM) 30-600 MG 12hr tablet Take 1 tablet by mouth 2 (two) times daily as needed for cough. 30 tablet 0   ENTRESTO  24-26 MG Take 1 tablet by mouth 2 (two) times daily.     Fe Fum-Vit C-Vit B12-FA (TRIGELS-F FORTE) CAPS capsule Take 1  capsule by mouth 2 (two) times daily. 180 capsule 0   finasteride  (PROSCAR ) 5 MG tablet Take 1 tablet (5 mg total) by mouth daily. 30 tablet 2   [Paused] furosemide  (LASIX ) 20 MG tablet Take 2 tablets (40 mg total) by mouth daily. 60 tablet 5   metoprolol  succinate (TOPROL -XL) 25 MG 24 hr tablet Take 1 tablet (25 mg total) by mouth daily. 90 tablet 3   nitroGLYCERIN  (NITROSTAT ) 0.4 MG SL tablet Place 1 tablet (0.4 mg total) under the tongue every 5 (five) minutes as needed for chest pain. 25 tablet 5   oxyCODONE -acetaminophen  (PERCOCET/ROXICET) 5-325 MG tablet Take 1 tablet by mouth every 4 (four) hours as needed for moderate pain (pain score 4-6). 15 tablet 0   solifenacin (VESICARE) 10 MG tablet Take 10 mg by mouth daily. (Patient not taking: No sig reported)     tamsulosin  (FLOMAX ) 0.4 MG CAPS capsule Take 1 capsule (0.4 mg total) by mouth daily after supper. 30 capsule 2   traZODone  (DESYREL ) 50 MG tablet Take 1 tablet (50 mg total) by mouth at bedtime as needed for sleep. 90 tablet 1   No current facility-administered medications for this visit.   There were no vitals filed for this visit.  Wt Readings from Last 3 Encounters:  07/14/24 192 lb 3.9 oz (87.2 kg)  05/22/24 192 lb (87.1 kg)  05/17/24 191 lb (86.6 kg)   Lab Results  Component Value Date   CREATININE 1.71 (H) 07/14/2024   CREATININE 1.39 (H) 07/13/2024   CREATININE 1.36 (H) 07/12/2024    PHYSICAL EXAM:  General: Ill appearing. No resp difficulty HEENT:  normal Neck: supple, no JVD Cor: Regular rhythm, rate. No rubs, gallops or murmurs Lungs: clear Abdomen: soft, nontender, nondistended, + swelling right abdominal side. Extremities: no cyanosis, clubbing, rash, edema Neuro: alert & oriented X 3. Moves all 4 extremities w/o difficulty. Affect pleasant  ECG: not done  ReDs reading: 43 %, abnormal (see plan below)   ASSESSMENT & PLAN:  1: Ischemic heart failure with reduced ejection fraction- - NSTEMI with PCI 06/24 - NYHA class III - moderately fluid up with elevated ReDs, symptoms - ReDs 43%: 5 days ago it was 42% - will send for IV lasix  and patient is agreeable today - weight stable from last visit here 5 days ago - Echo 04/30/23: EF 30-35% along with mild LVH and Grade II DD and moderate Ronnie.  - Echo 10/13/23: EF 25-30% with moderate LAE, moderate Ronnie - will get echo updated as we're having to dial back GDMT due to hyptension - may need repeat cath or cMRI to further evaluate - continue furosemide  40mg  daily; consider changing to torsemide to see if he gets better response - continue metoprolol  succinate 25mg  daily - entresto  stopped due to hypotension - spiro was previously stopped due to hypotension - due to chronic kidney stone; will not be a good candidate for SGLT2 - reviewed the importance of keeping daily fluid intake to 60-64 ounces and to measure how many ounces his cup holds; he estimates the cup holds 20 oz. Based on that his daily fluid intake ranges from 92-158 ounces - BNP 05/10/24 was 2316.8 - BNP today  2: CAD- - was NS with cardiology [place]) 08/24; cardiology referral placed again today - continue atorvastatin  40mg  daily - LDL 05/17/24 was 51 - LHC 05/01/23:   Prox LAD to Mid LAD lesion is 99% stenosed.   Mid LAD lesion is 60% stenosed.   1st Diag lesion  is 70% stenosed.   Prox RCA lesion is 95% stenosed.   Mid RCA lesion is 30% stenosed.   RPDA lesion is 60% stenosed.   Dist LAD lesion is 99% stenosed.    A drug-eluting stent was successfully placed using a STENT ONYX FRONTIER 4.0X15.   Balloon angioplasty was performed using a BALLN [place] EUPHORA RX 2.75X20.   Post intervention, there is a 20% residual stenosis.   Post intervention, there is a 0% residual stenosis.   There is severe left ventricular systolic dysfunction.   LV end diastolic pressure is severely elevated.   There is moderate (3+) mitral regurgitation.  1.  Severe two-vessel coronary artery disease with subtotal occlusion of the proximal LAD, diffuse moderate mid LAD disease and severe distal LAD disease.  In addition, there is 95% in the proximal right coronary artery with faint right to left collaterals to the LAD.  The left circumflex has mild nonobstructive disease and OM 3 is large and reaches the apex. 2.  Severely reduced LV systolic function with an EF of 25%.  Severely elevated left ventricular end-diastolic pressure at 34 mmHg. 3.  Successful balloon angioplasty to the proximal LAD and drug-eluting stent placement to the proximal right coronary artery.  3: HTN- - BP 92/53 - saw PCP Neill) 04/25 - BMP 05/17/24 reviewed: sodium 133, potassium 4.7, creatinine 1.71 & GFR 43 - BMET today  4: Renal stone- - followed by [facility] urology (last seen 01/25) - has bilateral staghorn renal stones  - right ureteral stent placed 10/12/23  - PCNL and urethral stent done 01/22/24 with subsequent complication of pneumothorax - urethral stent removed by EDP on 02/05/24 with improvement of severe pain - emphasized that he reach back out to urology or to his PCP regarding his cloudy urine and pain where nephrostomy tube was  5: SVT- - zio 03/25 showed frequent episodes of SVT - has EP appt 08/25  6: Anemia- - ferritin level 05/11/24 was 17 - going to get iron  infusion today  - Hg 05/17/24 was 9.4 - CBC today as he's been having dark stools at home - he's picking up his oral iron  today   Return next week to see HF MD, sooner if needed.    Ellouise DELENA Class, FNP 08/01/24

## 2024-08-01 NOTE — Telephone Encounter (Signed)
 Called to confirm/remind patient of their appointment at the Advanced Heart Failure Clinic on 08/02/24.   Appointment:   [] Confirmed  [] Left mess   [] No answer/No voice mail  [x] VM Full/unable to leave message  [] Phone not in service  Patient reminded to bring all medications and/or complete list.  Confirmed patient has transportation. Gave directions, instructed to utilize valet parking.

## 2024-08-02 ENCOUNTER — Telehealth: Payer: Self-pay | Admitting: Family

## 2024-08-02 ENCOUNTER — Encounter: Admitting: Family

## 2024-08-02 NOTE — Telephone Encounter (Signed)
 Patient did not show for his Heart Failure Clinic appointment on 08/02/24.

## 2024-08-14 ENCOUNTER — Other Ambulatory Visit: Payer: Self-pay | Admitting: Internal Medicine

## 2024-08-14 DIAGNOSIS — F5104 Psychophysiologic insomnia: Secondary | ICD-10-CM

## 2024-08-15 ENCOUNTER — Other Ambulatory Visit: Payer: Self-pay

## 2024-08-15 MED ORDER — TRAZODONE HCL 50 MG PO TABS
50.0000 mg | ORAL_TABLET | Freq: Every evening | ORAL | 0 refills | Status: AC | PRN
Start: 2024-08-15 — End: ?
  Filled 2024-08-15: qty 90, 90d supply, fill #0

## 2024-08-15 NOTE — Telephone Encounter (Signed)
 Requested Prescriptions  Pending Prescriptions Disp Refills   traZODone  (DESYREL ) 50 MG tablet 90 tablet 0    Sig: Take 1 tablet (50 mg total) by mouth at bedtime as needed for sleep.     Psychiatry: Antidepressants - Serotonin Modulator Passed - 08/15/2024  3:39 PM      Passed - Valid encounter within last 6 months    Recent Outpatient Visits           5 months ago Pulmonary embolism without acute cor pulmonale, unspecified chronicity, unspecified pulmonary embolism type [facility])   [facility] Bernardo Fend, [place]

## 2024-08-21 ENCOUNTER — Other Ambulatory Visit: Payer: Self-pay

## 2024-08-21 ENCOUNTER — Ambulatory Visit: Attending: Cardiology | Admitting: Cardiology

## 2024-08-21 NOTE — Progress Notes (Deleted)
  Electrophysiology Office Note:    Date:  08/21/2024   ID:  Ronnie Mann, DOB 08/02/1956, MRN [id number]  [facility] Cardiologist:  Ronnie Lunger, MD  [facility] Electrophysiologist:  Ronnie ONEIDA HOLTS, MD   Referring MD: Ronnie Ellouise LABOR, FNP   Chief Complaint: SVT  History of Present Illness:    Ronnie Mann is a 68 year old man who I am seeing today for an evaluation of SVT at the request of Ellouise Ronnie.  The patient has a history of severe coronary artery disease, chronic systolic heart failure, prior tobacco/alcohol use, hypertension, CKD, hyperlipidemia and long history of kidney stones.  Also with a history of pulmonary embolism in March 2025.      Their past medical, social and family history was reviewed.   ROS:   Please see the history of present illness.    All other systems reviewed and are negative.  EKGs/Labs/Other Studies Reviewed:    The following studies were reviewed today:  July 15, 2024 echo EF 25 to 30% RV normal Mild to moderate MR  Apr 08, 2024 ZIO monitor Heart rate 38-1 76, average 69 5 nonsustained VT, longest 5 beats 392 nonsustained SVT episodes, longest 17 beats.  Frequent supraventricular ectopy, 9.3% Rare ventricular ectopy        Physical Exam:    VS:  There were no vitals taken for this visit.    Wt Readings from Last 3 Encounters:  07/14/24 192 lb 3.9 oz (87.2 kg)  05/22/24 192 lb (87.1 kg)  05/17/24 191 lb (86.6 kg)     GEN: no distress CARD: RRR, No MRG RESP: No IWOB. CTAB.        ASSESSMENT AND PLAN:    No diagnosis found.  #Nonsustained supraventricular tachycardia Morphology appears consistent with atrial tachycardia.  Recommend continuing metoprolol  succinate but increasing to 25 mg by mouth twice daily Would avoid invasive procedures for now If he has recurrence, could consider antiarrhythmic drugs  #Severe coronary artery disease No ischemic symptoms today.  Continue aspirin , statin  #Chronic  systolic heart failure Follows with heart failure clinic.  NYHA class II-III.  Warm and dry on exam.  Continue metoprolol , Lasix , Entresto    Follow-up with EP APP in 6 months      Signed, Ronnie T. HOLTS, MD, [facility], [facility] 08/21/2024 2:53 PM    Electrophysiology [facility]

## 2024-08-22 ENCOUNTER — Other Ambulatory Visit (HOSPITAL_COMMUNITY): Payer: Self-pay

## 2024-09-15 ENCOUNTER — Emergency Department

## 2024-09-15 ENCOUNTER — Other Ambulatory Visit: Payer: Self-pay

## 2024-09-15 ENCOUNTER — Inpatient Hospital Stay
Admission: EM | Admit: 2024-09-15 | Discharge: 2024-09-19 | DRG: 683 | Disposition: A | Discharge status: Home / Self Care | Attending: Internal Medicine | Admitting: Internal Medicine

## 2024-09-15 DIAGNOSIS — Z87891 Personal history of nicotine dependence: Secondary | ICD-10-CM | POA: Diagnosis not present

## 2024-09-15 DIAGNOSIS — E785 Hyperlipidemia, unspecified: Secondary | ICD-10-CM | POA: Diagnosis present

## 2024-09-15 DIAGNOSIS — R310 Gross hematuria: Secondary | ICD-10-CM | POA: Diagnosis present

## 2024-09-15 DIAGNOSIS — Z8249 Family history of ischemic heart disease and other diseases of the circulatory system: Secondary | ICD-10-CM

## 2024-09-15 DIAGNOSIS — I255 Ischemic cardiomyopathy: Secondary | ICD-10-CM | POA: Diagnosis present

## 2024-09-15 DIAGNOSIS — N1831 Chronic kidney disease, stage 3a: Secondary | ICD-10-CM | POA: Diagnosis present

## 2024-09-15 DIAGNOSIS — R7401 Elevation of levels of liver transaminase levels: Secondary | ICD-10-CM | POA: Diagnosis present

## 2024-09-15 DIAGNOSIS — N390 Urinary tract infection, site not specified: Secondary | ICD-10-CM | POA: Diagnosis present

## 2024-09-15 DIAGNOSIS — I5042 Chronic combined systolic (congestive) and diastolic (congestive) heart failure: Secondary | ICD-10-CM | POA: Diagnosis present

## 2024-09-15 DIAGNOSIS — R197 Diarrhea, unspecified: Secondary | ICD-10-CM | POA: Diagnosis present

## 2024-09-15 DIAGNOSIS — N12 Tubulo-interstitial nephritis, not specified as acute or chronic: Principal | ICD-10-CM

## 2024-09-15 DIAGNOSIS — I130 Hypertensive heart and chronic kidney disease with heart failure and stage 1 through stage 4 chronic kidney disease, or unspecified chronic kidney disease: Secondary | ICD-10-CM | POA: Diagnosis present

## 2024-09-15 DIAGNOSIS — Z79899 Other long term (current) drug therapy: Secondary | ICD-10-CM

## 2024-09-15 DIAGNOSIS — Z7982 Long term (current) use of aspirin: Secondary | ICD-10-CM

## 2024-09-15 DIAGNOSIS — R102 Pelvic and perineal pain unspecified side: Secondary | ICD-10-CM | POA: Diagnosis present

## 2024-09-15 DIAGNOSIS — D631 Anemia in chronic kidney disease: Secondary | ICD-10-CM | POA: Diagnosis present

## 2024-09-15 DIAGNOSIS — N400 Enlarged prostate without lower urinary tract symptoms: Secondary | ICD-10-CM | POA: Diagnosis present

## 2024-09-15 DIAGNOSIS — R300 Dysuria: Secondary | ICD-10-CM | POA: Insufficient documentation

## 2024-09-15 DIAGNOSIS — E871 Hypo-osmolality and hyponatremia: Secondary | ICD-10-CM | POA: Diagnosis present

## 2024-09-15 DIAGNOSIS — Z888 Allergy status to other drugs, medicaments and biological substances status: Secondary | ICD-10-CM

## 2024-09-15 DIAGNOSIS — I2510 Atherosclerotic heart disease of native coronary artery without angina pectoris: Secondary | ICD-10-CM | POA: Diagnosis present

## 2024-09-15 DIAGNOSIS — Z955 Presence of coronary angioplasty implant and graft: Secondary | ICD-10-CM

## 2024-09-15 DIAGNOSIS — I429 Cardiomyopathy, unspecified: Secondary | ICD-10-CM

## 2024-09-15 DIAGNOSIS — N136 Pyonephrosis: Secondary | ICD-10-CM | POA: Diagnosis present

## 2024-09-15 DIAGNOSIS — N202 Calculus of kidney with calculus of ureter: Secondary | ICD-10-CM | POA: Diagnosis present

## 2024-09-15 DIAGNOSIS — N179 Acute kidney failure, unspecified: Secondary | ICD-10-CM | POA: Diagnosis present

## 2024-09-15 DIAGNOSIS — N189 Chronic kidney disease, unspecified: Secondary | ICD-10-CM

## 2024-09-15 DIAGNOSIS — Z555 Less than a high school diploma: Secondary | ICD-10-CM | POA: Diagnosis not present

## 2024-09-15 DIAGNOSIS — I252 Old myocardial infarction: Secondary | ICD-10-CM

## 2024-09-15 DIAGNOSIS — B9561 Methicillin susceptible Staphylococcus aureus infection as the cause of diseases classified elsewhere: Secondary | ICD-10-CM | POA: Diagnosis present

## 2024-09-15 DIAGNOSIS — K449 Diaphragmatic hernia without obstruction or gangrene: Secondary | ICD-10-CM | POA: Diagnosis present

## 2024-09-15 DIAGNOSIS — N1330 Unspecified hydronephrosis: Secondary | ICD-10-CM | POA: Diagnosis present

## 2024-09-15 DIAGNOSIS — I10 Essential (primary) hypertension: Secondary | ICD-10-CM | POA: Diagnosis present

## 2024-09-15 LAB — CBC
HCT: 38.5 % — ABNORMAL LOW (ref 39.0–52.0)
Hemoglobin: 13.3 g/dL (ref 13.0–17.0)
MCH: 32.4 pg (ref 26.0–34.0)
MCHC: 34.5 g/dL (ref 30.0–36.0)
MCV: 93.9 fL (ref 80.0–100.0)
Platelets: 272 K/uL (ref 150–400)
RBC: 4.1 MIL/uL — ABNORMAL LOW (ref 4.22–5.81)
RDW: 13 % (ref 11.5–15.5)
WBC: 5.2 K/uL (ref 4.0–10.5)
nRBC: 0 % (ref 0.0–0.2)

## 2024-09-15 LAB — URINALYSIS, ROUTINE W REFLEX MICROSCOPIC
Bilirubin Urine: NEGATIVE
Glucose, UA: NEGATIVE mg/dL
Ketones, ur: NEGATIVE mg/dL
Nitrite: POSITIVE — AB
Protein, ur: 100 mg/dL — AB
RBC / HPF: >50 RBC/hpf (ref 0–5)
Specific Gravity, Urine: 1.013 (ref 1.005–1.030)
Squamous Epithelial / HPF: 0 /HPF (ref 0–5)
WBC, UA: >50 WBC/hpf (ref 0–5)
pH: 5 (ref 5.0–8.0)

## 2024-09-15 LAB — COMPREHENSIVE METABOLIC PANEL WITH GFR
ALT: 239 U/L — ABNORMAL HIGH (ref 0–44)
AST: 183 U/L — ABNORMAL HIGH (ref 15–41)
Albumin: 3.9 g/dL (ref 3.5–5.0)
Alkaline Phosphatase: 272 U/L — ABNORMAL HIGH (ref 38–126)
Anion gap: 12 (ref 5–15)
BUN: 73 mg/dL — ABNORMAL HIGH (ref 8–23)
CO2: 18 mmol/L — ABNORMAL LOW (ref 22–32)
Calcium: 9.6 mg/dL (ref 8.9–10.3)
Chloride: 98 mmol/L (ref 98–111)
Creatinine, Ser: 2.68 mg/dL — ABNORMAL HIGH (ref 0.61–1.24)
GFR, Estimated: 25 mL/min — ABNORMAL LOW (ref >60)
Glucose, Bld: 232 mg/dL — ABNORMAL HIGH (ref 70–99)
Potassium: 4.3 mmol/L (ref 3.5–5.1)
Sodium: 128 mmol/L — ABNORMAL LOW (ref 135–145)
Total Bilirubin: 1.6 mg/dL — ABNORMAL HIGH (ref 0.0–1.2)
Total Protein: 7.9 g/dL (ref 6.5–8.1)

## 2024-09-15 LAB — LIPASE, BLOOD: Lipase: 27 U/L (ref 11–51)

## 2024-09-15 MED ORDER — NITROGLYCERIN 0.4 MG SL SUBL: 0.4000 mg | SUBLINGUAL_TABLET | SUBLINGUAL | Status: DC | PRN

## 2024-09-15 MED ORDER — ONDANSETRON HCL 4 MG/2ML IJ SOLN
4.0000 mg | Freq: Once | INTRAMUSCULAR | Status: AC
  Administered 2024-09-15: 4 mg via INTRAVENOUS
  Filled 2024-09-15: qty 2

## 2024-09-15 MED ORDER — SODIUM CHLORIDE 0.9 % IV SOLN
2.0000 g | INTRAVENOUS | Status: AC
  Administered 2024-09-16 – 2024-09-19 (×4): 2 g via INTRAVENOUS
  Filled 2024-09-15 (×4): qty 20

## 2024-09-15 MED ORDER — ONDANSETRON HCL 4 MG/2ML IJ SOLN: 4.0000 mg | Freq: Four times a day (QID) | INTRAMUSCULAR | Status: DC | PRN

## 2024-09-15 MED ORDER — METOPROLOL SUCCINATE ER 25 MG PO TB24
25.0000 mg | ORAL_TABLET | Freq: Every day | ORAL | Status: DC
  Administered 2024-09-16 – 2024-09-19 (×4): 25 mg via ORAL
  Filled 2024-09-15 (×4): qty 1

## 2024-09-15 MED ORDER — ACETAMINOPHEN 325 MG PO TABS
650.0000 mg | ORAL_TABLET | Freq: Four times a day (QID) | ORAL | Status: DC | PRN
  Administered 2024-09-15 – 2024-09-19 (×11): 650 mg via ORAL
  Filled 2024-09-15 (×11): qty 2

## 2024-09-15 MED ORDER — DM-GUAIFENESIN ER 30-600 MG PO TB12
1.0000 | ORAL_TABLET | Freq: Two times a day (BID) | ORAL | Status: DC | PRN
  Administered 2024-09-16 – 2024-09-17 (×2): 1 via ORAL
  Filled 2024-09-15 (×2): qty 1

## 2024-09-15 MED ORDER — HYDRALAZINE HCL 20 MG/ML IJ SOLN: 5.0000 mg | Freq: Four times a day (QID) | INTRAMUSCULAR | Status: DC | PRN

## 2024-09-15 MED ORDER — SENNOSIDES-DOCUSATE SODIUM 8.6-50 MG PO TABS: 1.0000 | ORAL_TABLET | Freq: Every evening | ORAL | Status: DC | PRN

## 2024-09-15 MED ORDER — ACETAMINOPHEN 650 MG RE SUPP: 650.0000 mg | Freq: Four times a day (QID) | RECTAL | Status: DC | PRN

## 2024-09-15 MED ORDER — ALBUTEROL SULFATE (2.5 MG/3ML) 0.083% IN NEBU: 2.5000 mg | INHALATION_SOLUTION | Freq: Four times a day (QID) | RESPIRATORY_TRACT | Status: DC | PRN

## 2024-09-15 MED ORDER — ONDANSETRON HCL 4 MG PO TABS: 4.0000 mg | ORAL_TABLET | Freq: Four times a day (QID) | ORAL | Status: DC | PRN

## 2024-09-15 MED ORDER — KETOROLAC TROMETHAMINE 15 MG/ML IJ SOLN: 15.0000 mg | Freq: Four times a day (QID) | INTRAMUSCULAR | Status: DC | PRN

## 2024-09-15 MED ORDER — TAMSULOSIN HCL 0.4 MG PO CAPS
0.4000 mg | ORAL_CAPSULE | Freq: Every day | ORAL | Status: DC
  Administered 2024-09-15 – 2024-09-18 (×4): 0.4 mg via ORAL
  Filled 2024-09-15 (×4): qty 1

## 2024-09-15 MED ORDER — HEPARIN SODIUM (PORCINE) 5000 UNIT/ML IJ SOLN
5000.0000 [IU] | Freq: Three times a day (TID) | INTRAMUSCULAR | Status: DC
  Administered 2024-09-15 – 2024-09-19 (×8): 5000 [IU] via SUBCUTANEOUS
  Filled 2024-09-15 (×9): qty 1

## 2024-09-15 MED ORDER — FINASTERIDE 5 MG PO TABS
5.0000 mg | ORAL_TABLET | Freq: Every day | ORAL | Status: DC
  Administered 2024-09-16 – 2024-09-19 (×4): 5 mg via ORAL
  Filled 2024-09-15 (×4): qty 1

## 2024-09-15 MED ORDER — OXYCODONE HCL 5 MG PO TABS
5.0000 mg | ORAL_TABLET | Freq: Four times a day (QID) | ORAL | Status: DC | PRN
  Administered 2024-09-15 – 2024-09-16 (×3): 5 mg via ORAL
  Filled 2024-09-15 (×3): qty 1

## 2024-09-15 MED ORDER — SODIUM CHLORIDE 0.9 % IV SOLN
2.0000 g | Freq: Once | INTRAVENOUS | Status: AC
  Administered 2024-09-15: 2 g via INTRAVENOUS
  Filled 2024-09-15: qty 20

## 2024-09-15 MED ORDER — KETOROLAC TROMETHAMINE 30 MG/ML IJ SOLN
30.0000 mg | Freq: Once | INTRAMUSCULAR | Status: AC
  Administered 2024-09-15: 30 mg via INTRAVENOUS
  Filled 2024-09-15: qty 1

## 2024-09-15 MED ORDER — SODIUM CHLORIDE 0.9 % IV SOLN: INTRAVENOUS | Status: DC

## 2024-09-15 MED ORDER — MORPHINE SULFATE (PF) 4 MG/ML IV SOLN
4.0000 mg | Freq: Once | INTRAVENOUS | Status: AC
  Administered 2024-09-15: 4 mg via INTRAVENOUS
  Filled 2024-09-15: qty 1

## 2024-09-15 MED ORDER — TRAZODONE HCL 50 MG PO TABS
50.0000 mg | ORAL_TABLET | Freq: Every evening | ORAL | Status: DC | PRN
  Administered 2024-09-15 – 2024-09-19 (×3): 50 mg via ORAL
  Filled 2024-09-15 (×3): qty 1

## 2024-09-15 MED ORDER — FENTANYL CITRATE (PF) 50 MCG/ML IJ SOSY
25.0000 ug | PREFILLED_SYRINGE | INTRAMUSCULAR | Status: AC | PRN
  Administered 2024-09-15 – 2024-09-16 (×3): 25 ug via INTRAVENOUS
  Filled 2024-09-15 (×3): qty 1

## 2024-09-15 NOTE — H&P (Addendum)
 History and Physical   Ronnie Mann FMW:[id number] DOB: [birth year] DOA: 09/15/2024  PCP: Bernardo Fend, DO  Patient coming from: Home  I have personally briefly reviewed patient's old medical records in [facility] EMR.  Chief Concern: Right flank pain  HPI: Ronnie Mann is a 68 year old male with history of BPH, hyperlipidemia, hypertension, insomnia, CKD 3A, who presents to the ED for chief concerns of right sided flank pain for 4 days.  Vitals in the ED showed t of 98.2, rr 18, hr 95, blood pressure 110/83, SpO2 97% on room air.  Serum sodium is 128, potassium 4.3, chloride 98, bicarb 18, BUN of 73, serum creatinine of 2.68, eGFR of 25, nonfasting glucose 232, WBC 5.2, hemoglobin 13.3, platelets of 272.  AST is 183, ALT is 239.  T. bili is 1.6.  Alk phos is 272.  UA is positive for trace leukocytes and positive for nitrates.  ED treatment: Toradol  30 mg IV one-time dose, morphine  4 mg IV one-time dose, ondansetron  4 mg IV one-time dose, ceftriaxone  2 g IV one-time dose. --------------------------------------- At bedside, patient was able to tell me his first and last name, age, current location and current calendar year.   He reports he has been feeling poorly for the last 4 days.  He reports poor p.o. intake over the last 2 to 3 days.  He endorses right flank pain and denies trauma to his person.  He reports it feels like his prior episodes of kidney stones.  He denies fever, chest pain, shortness of breath, abdominal pain, diarrhea, blood in his stool.  He endorses dysuria and poor p.o. intake.  Social history: He lives at home.  He denies tobacco, EtOH, recreational drug use.  He is retired.  ROS: Constitutional: no weight change, no fever ENT/Mouth: no sore throat, no rhinorrhea Eyes: no eye pain, no vision changes Cardiovascular: no chest pain, no dyspnea,  no edema, no palpitations Respiratory: no cough, no sputum, no wheezing Gastrointestinal: + nausea, no  vomiting, no diarrhea, no constipation Genitourinary: no urinary incontinence, + dysuria, no hematuria Musculoskeletal: no arthralgias, no myalgias Skin: no skin lesions, no pruritus, Neuro: + weakness, no loss of consciousness, no syncope Psych: no anxiety, no depression, + decrease appetite Heme/Lymph: no bruising, no bleeding  ED Course: Discussed with EDP, patient requiring hospitalization for chief concerns of acute kidney injury and complicated UTI.  Assessment/Plan  Principal Problem:   AKI (acute kidney injury) Active Problems:   Complicated UTI (urinary tract infection)   HLD (hyperlipidemia)   Hydronephrosis of left kidney   Essential hypertension   CAD (coronary artery disease)   CKD stage 3a, GFR 45-59 ml/min (HCC)   Cardiomyopathy (HCC)   CAD in native artery   Calculus of kidney with calculus of ureter   Dysuria   Assessment and Plan:  * AKI (acute kidney injury) On CKD 3A Suspect multifactorial secondary to prerenal and intrarenal in setting of complicated UTI Sodium chloride  infusion at 125 mL/h, 1 day ordered Strict I's and O's Recheck BMP in a.m.  Complicated UTI (urinary tract infection) Continue ceftriaxone  2 g IV daily to complete a 5-day course Urine cultures in process  Essential hypertension Metoprolol  succinate 25 mg daily resumed for 09/16/2024 Hydralazine  5 mg IV every 6 hours as needed for SBP greater 175, 5 days ordered  Dysuria Secondary to UTI, treat per above  Calculus of kidney with calculus of ureter Patient continues to state that he does not want to be evaluated by urology  for any urological procedure  Chart reviewed.   Hospitalization from 8/20/202-07/14/24: Patient admitted for complicated UTI with multiple renal calculus and urology was consulted.  Per 07/13/24: Urology notes states that patient declined cystoscopy.  No further urologic procedure indicated at that time.  DVT prophylaxis: Heparin  5000 units subcutaneous every 8  hours Code Status: Full code if Diet: Heart healthy Family Communication: a phone call was offered, patient declined stating that his son knows he is being admitted to the hospital Disposition Plan: Pending clinical course Consults called: None at this time Admission status: Telemetry medical, inpatient  Past Medical History:  Diagnosis Date   CHF (congestive heart failure) (HCC)    Coronary artery disease 05/01/2023   PCI/DES   HFrEF (heart failure with reduced ejection fraction) (HCC) 05/01/2023   Hiatal hernia    HLD (hyperlipidemia)    HTN (hypertension)    Ischemic cardiomyopathy 05/01/2023   LVEF 30-35%   Kidney stones    Pneumothorax    Past Surgical History:  Procedure Laterality Date   CORONARY STENT INTERVENTION N/A 05/01/2023   Procedure: CORONARY STENT INTERVENTION;  Surgeon: Darron Deatrice LABOR, MD;  Location: [facility] INVASIVE CV [facility];  Service: Cardiovascular;  Laterality: N/A;   CYSTOSCOPY W/ URETERAL STENT PLACEMENT Right 10/12/2023   Procedure: CYSTOSCOPY WITH STENT REPLACEMENT;  Surgeon: Twylla Glendia BROCKS, MD;  Location: [facility] ORS;  Service: Urology;  Laterality: Right;   LEFT HEART CATH AND CORONARY ANGIOGRAPHY N/A 05/01/2023   Procedure: LEFT HEART CATH AND CORONARY ANGIOGRAPHY;  Surgeon: Darron Deatrice LABOR, MD;  Location: [facility] INVASIVE CV LAB;  Service: Cardiovascular;  Laterality: N/A;   Social History:  reports that he has quit smoking. His smoking use included cigarettes. He has never been exposed to tobacco smoke. He has never used smokeless tobacco. He reports that he does not currently use alcohol. He reports current drug use. Drug: Marijuana.  Allergies  Allergen Reactions   Losartan  Other (See Comments)    Chest pain   Family History  Problem Relation Age of Onset   Heart disease Father    Family history: Family history reviewed and not pertinent.  Prior to Admission medications   Medication Sig Start Date End Date Taking? Authorizing Provider   albuterol  (VENTOLIN  HFA) 108 (90 Base) MCG/ACT inhaler Inhale 2 puffs into the lungs every 6 (six) hours as needed for wheezing or shortness of breath. 07/14/24   Caleen Qualia, MD  aspirin  81 MG chewable tablet Chew 1 tablet (81 mg total) by mouth daily. 05/15/24   Caleen Qualia, MD  atorvastatin  (LIPITOR) 40 MG tablet Take 1 tablet (40 mg total) by mouth daily. 01/08/24   Donette Ellouise LABOR, FNP  dextromethorphan -guaiFENesin  (MUCINEX  DM) 30-600 MG 12hr tablet Take 1 tablet by mouth 2 (two) times daily as needed for cough. 05/14/24   Caleen Qualia, MD  ENTRESTO  24-26 MG Take 1 tablet by mouth 2 (two) times daily. 04/05/24   [provider]  Fe Fum-Vit C-Vit B12-FA (TRIGELS-F FORTE) CAPS capsule Take 1 capsule by mouth 2 (two) times daily. 05/14/24   Amin, Sumayya, MD  finasteride  (PROSCAR ) 5 MG tablet Take 1 tablet (5 mg total) by mouth daily. 07/15/24   Amin, Sumayya, MD  furosemide  (LASIX ) 20 MG tablet Take 2 tablets (40 mg total) by mouth daily. 05/17/24 08/15/24  Donette Ellouise LABOR, FNP  metoprolol  succinate (TOPROL -XL) 25 MG 24 hr tablet Take 1 tablet (25 mg total) by mouth daily. 01/08/24   Donette Ellouise LABOR, FNP  nitroGLYCERIN  (NITROSTAT ) 0.4  MG SL tablet Place 1 tablet (0.4 mg total) under the tongue every 5 (five) minutes as needed for chest pain. 03/12/24 03/12/25  Donette Ellouise LABOR, FNP  oxyCODONE -acetaminophen  (PERCOCET/ROXICET) 5-325 MG tablet Take 1 tablet by mouth every 4 (four) hours as needed for moderate pain (pain score 4-6). 07/14/24   Amin, Sumayya, MD  solifenacin (VESICARE) 10 MG tablet Take 10 mg by mouth daily. Patient not taking: No sig reported    [provider]  tamsulosin  (FLOMAX ) 0.4 MG CAPS capsule Take 1 capsule (0.4 mg total) by mouth daily after supper. 07/14/24   Amin, Sumayya, MD  traZODone  (DESYREL ) 50 MG tablet Take 1 tablet (50 mg total) by mouth at bedtime as needed for sleep. 08/15/24   Bernardo Fend, DO   Physical Exam: Vitals:   09/15/24 1206 09/15/24  1438  BP: 110/83   Pulse: 95   Resp: 18   Temp: 98.2 F (36.8 C)   SpO2: 97%   Weight:  89.8 kg   Constitutional: appears age-appropriate, NAD, calm Eyes: PERRL, lids and conjunctivae normal ENMT: Mucous membranes are moist. Posterior pharynx clear of any exudate or lesions. Age-appropriate dentition. Hearing appropriate Neck: normal, supple, no masses, no thyromegaly Respiratory: clear to auscultation bilaterally, no wheezing, no crackles. Normal respiratory effort. No accessory muscle use.  Cardiovascular: Regular rate and rhythm, no murmurs / rubs / gallops. No extremity edema. 2+ pedal pulses. No carotid bruits.  Abdomen: no tenderness, no masses palpated, no hepatosplenomegaly. Bowel sounds positive.  Musculoskeletal: no clubbing / cyanosis. No joint deformity upper and lower extremities. Good ROM, no contractures, no atrophy. Normal muscle tone.  Skin: no rashes, lesions, ulcers. No induration Neurologic: Sensation intact. Strength 5/5 in all 4.  Psychiatric: Normal judgment and insight. Alert and oriented x 3. Normal mood.   EKG: Not indicated at this time Chest x-ray on Admission: Not indicated at this time  CT Renal Stone Study Result Date: 09/15/2024 EXAM: CT UROGRAM 09/15/2024 01:18:26 PM TECHNIQUE: CT of the abdomen and pelvis was performed without the administration of intravenous contrast as per CT urogram protocol. Multiplanar reformatted images as well as MIP urogram images are provided for review. Automated exposure control, iterative reconstruction, and/or weight based adjustment of the mA/kV was utilized to reduce the radiation dose to as low as reasonably achievable. COMPARISON: 07/10/2024 CLINICAL HISTORY: Abdominal/flank pain, stone suspected. R side flank and groin pain. FINDINGS: LOWER CHEST: Moderate to large hiatal hernia. LIVER: The liver is unremarkable. GALLBLADDER AND BILE DUCTS: Multiple large gallstones are identified within the gallbladder measuring up to  3.5 cm. No pericholecystic inflammatory changes identified. No biliary ductal dilatation. SPLEEN: No acute abnormality. PANCREAS: No acute abnormality. ADRENAL GLANDS: Unchanged left adrenal gland adenoma measuring 1.6 cm, image 22/2. Right adrenal gland adenoma is also unchanged measuring 1.1 cm, image 17/2. No follow-up imaging recommended. KIDNEYS, URETERS AND BLADDER: Multiple bilateral Bosniak class 1 and 2 cysts appear unchanged. No follow-up imaging recommended. There are several stones within the lower pole of the right kidney, which measure up to 5 mm, image 36/2. Mild right hydronephrosis and hydroureter with diffuse periureteral soft tissue stranding noted. There is no stone identified along the course of the right ureter. Multiple left renal calculi are also identified. The largest stone is in the inferior pole measuring 2.3 cm. Persistent severe left hydronephrosis with diffuse cortical thinning is identified. Persistent stone within the proximal left ureter just beyond the UPJ measures 8 mm, image 47/2. No focal bladder abnormality. GI AND BOWEL:  Stomach demonstrates no acute abnormality. The appendix is visualized and normal in caliber, without wall thickening, periappendiceal inflammation, or fluid. There is no bowel obstruction. PERITONEUM AND RETROPERITONEUM: No ascites. No free air. VASCULATURE: Aorta is normal in caliber. Aortic atherosclerotic calcifications. LYMPH NODES: No lymphadenopathy. REPRODUCTIVE ORGANS: Prostate gland appears normal. BONES AND SOFT TISSUES: No acute osseous abnormality. Small fat-containing left inguinal hernia. IMPRESSION: 1. Persistent severe left hydronephrosis with diffuse cortical thinning, with multiple left renal calculi including a largest stone measuring 2.3 cm, and an 8 mm stone in the proximal left ureter just beyond the UPJ. 2. Mild right hydronephrosis and hydroureter with multiple right renal stones measuring up to 5 mm, and no ureteral stone identified.  This may reflect recent past right ureteral calculus or ascending urinary tract infection. 3. Gallstones 4. Hiatal hernia Electronically signed by: Waddell Calk MD 09/15/2024 01:32 PM EDT RP Workstation: [id number]   Labs on Admission: I have personally reviewed following labs  CBC: Recent Labs  Lab 09/15/24 1232  WBC 5.2  HGB 13.3  HCT 38.5*  MCV 93.9  PLT 272   Basic Metabolic Panel: Recent Labs  Lab 09/15/24 1232  NA 128*  K 4.3  CL 98  CO2 18*  GLUCOSE 232*  BUN 73*  CREATININE 2.68*  CALCIUM  9.6   GFR: Estimated Creatinine Clearance: 29.2 mL/min (A) (by C-G formula based on SCr of 2.68 mg/dL (H)).  Liver Function Tests: Recent Labs  Lab 09/15/24 1232  AST 183*  ALT 239*  ALKPHOS 272*  BILITOT 1.6*  PROT 7.9  ALBUMIN  3.9   Recent Labs  Lab 09/15/24 1232  LIPASE 27   BNP (last 3 results) Recent Labs    12/21/23 1421  PROBNP 1,262*   Urine analysis:    Component Value Date/Time   COLORURINE YELLOW (A) 09/15/2024 1248   APPEARANCEUR TURBID (A) 09/15/2024 1248   APPEARANCEUR Cloudy (A) 10/23/2023 1417   LABSPEC 1.013 09/15/2024 1248   PHURINE 5.0 09/15/2024 1248   GLUCOSEU NEGATIVE 09/15/2024 1248   HGBUR LARGE (A) 09/15/2024 1248   BILIRUBINUR NEGATIVE 09/15/2024 1248   BILIRUBINUR Negative 10/23/2023 1417   KETONESUR NEGATIVE 09/15/2024 1248   PROTEINUR 100 (A) 09/15/2024 1248   NITRITE POSITIVE (A) 09/15/2024 1248   LEUKOCYTESUR TRACE (A) 09/15/2024 1248   This document was prepared using Dragon Voice Recognition software and may include unintentional dictation errors.  Dr. Sherre [facility]  If 7PM-7AM, please contact overnight-coverage provider If 7AM-7PM, please contact day attending provider [email or web address]  09/15/2024, 3:25 PM

## 2024-09-15 NOTE — Assessment & Plan Note (Signed)
 Patient continues to state that he does not want to be evaluated by urology for any urological procedure

## 2024-09-15 NOTE — Assessment & Plan Note (Signed)
 Secondary to UTI, treat per above

## 2024-09-15 NOTE — ED Triage Notes (Signed)
 Pt comes with right sided pain that might be infection or kidney stone. Pt states this started about 4 days ago. Pt states pain when urinating. Pt states he has had some dizziness.

## 2024-09-15 NOTE — Assessment & Plan Note (Addendum)
 On CKD 3A Suspect multifactorial secondary to prerenal and intrarenal in setting of complicated UTI Sodium chloride  infusion at 125 mL/h, 1 day ordered Strict I's and O's Recheck BMP in a.m.

## 2024-09-15 NOTE — Hospital Course (Signed)
 Mr. Treysean Petruzzi is a 68 year old male with history of BPH, hyperlipidemia, hypertension, insomnia, CKD 3A, who presents to the ED for chief concerns of right sided flank pain for 4 days.  Vitals in the ED showed t of 98.2, rr 18, hr 95, blood pressure 110/83, SpO2 97% on room air.  Serum sodium is 128, potassium 4.3, chloride 98, bicarb 18, BUN of 73, serum creatinine of 2.68, eGFR of 25, nonfasting glucose 232, WBC 5.2, hemoglobin 13.3, platelets of 272.  AST is 183, ALT is 239.  T. bili is 1.6.  Alk phos is 272.  UA is positive for trace leukocytes and positive for nitrates.  ED treatment: Toradol  30 mg IV one-time dose, morphine  4 mg IV one-time dose, ondansetron  4 mg IV one-time dose, ceftriaxone  2 g IV one-time dose.

## 2024-09-15 NOTE — ED Notes (Signed)
 Patient transported to CT

## 2024-09-15 NOTE — Assessment & Plan Note (Signed)
 Continue ceftriaxone  2 g IV daily to complete a 5-day course Urine cultures in process

## 2024-09-15 NOTE — ED Provider Notes (Signed)
 [facility] Provider Note    Event Date/Time   First MD Initiated Contact with Patient 09/15/24 1206     (approximate)   History   Abdominal Pain   HPI  Ronnie Mann is a 68 y.o. male with a history of CAD, CHF, hypertension, kidney stones who presents with complaints of right flank pain with radiation into his groin, over the last day he has had burning with urination as well.      Physical Exam   Triage Vital Signs: ED Triage Vitals  Encounter Vitals Group     BP 09/15/24 1206 110/83     Girls Systolic BP Percentile --      Girls Diastolic BP Percentile --      Boys Systolic BP Percentile --      Boys Diastolic BP Percentile --      Pulse Rate 09/15/24 1206 95     Resp 09/15/24 1206 18     Temp 09/15/24 1206 98.2 F (36.8 C)     Temp src --      SpO2 09/15/24 1206 97 %     Weight --      Height --      Head Circumference --      Peak Flow --      Pain Score 09/15/24 1205 10     Pain Loc --      Pain Education --      Exclude from Growth Chart --     Most recent vital signs: Vitals:   09/15/24 1206  BP: 110/83  Pulse: 95  Resp: 18  Temp: 98.2 F (36.8 C)  SpO2: 97%     General: Awake, uncomfortable CV:  Good peripheral perfusion.  Resp:  Normal effort.  Abd:  No distention.  Mild tenderness right CVA Other:     ED Results / Procedures / Treatments   Labs (all labs ordered are listed, but only abnormal results are displayed) Labs Reviewed  COMPREHENSIVE METABOLIC PANEL WITH GFR - Abnormal; Notable for the following components:      Result Value   Sodium 128 (*)    CO2 18 (*)    Glucose, Bld 232 (*)    BUN 73 (*)    Creatinine, Ser 2.68 (*)    AST 183 (*)    ALT 239 (*)    Alkaline Phosphatase 272 (*)    Total Bilirubin 1.6 (*)    GFR, Estimated 25 (*)    All other components within normal limits  CBC - Abnormal; Notable for the following components:   RBC 4.10 (*)    HCT 38.5 (*)    All other components  within normal limits  URINALYSIS, ROUTINE W REFLEX MICROSCOPIC - Abnormal; Notable for the following components:   Color, Urine YELLOW (*)    APPearance TURBID (*)    Hgb urine dipstick LARGE (*)    Protein, ur 100 (*)    Nitrite POSITIVE (*)    Leukocytes,Ua TRACE (*)    Bacteria, UA MANY (*)    All other components within normal limits  URINE CULTURE  LIPASE, BLOOD     EKG     RADIOLOGY CT demonstrates left hydronephrosis which is persistent but also new right hydronephrosis which is mild    PROCEDURES:  Critical Care performed:   Procedures   MEDICATIONS ORDERED IN ED: Medications  morphine  (PF) 4 MG/ML injection 4 mg (has no administration in time range)  ondansetron  (ZOFRAN ) injection 4  mg (has no administration in time range)  cefTRIAXone  (ROCEPHIN ) 2 g in sodium chloride  0.9 % 100 mL IVPB (has no administration in time range)  ketorolac  (TORADOL ) 30 MG/ML injection 30 mg (30 mg Intravenous Given 09/15/24 1243)     IMPRESSION / MDM / ASSESSMENT AND PLAN / ED COURSE  I reviewed the triage vital signs and the nursing notes. Patient's presentation is most consistent with acute presentation with potential threat to life or bodily function.  Patient presents with right flank pain as detailed above, differential includes ureterolithiasis, UTI, pyelonephritis  Will treat with IV Toradol , IV Zofran , obtain labs urinalysis CT and reevaluate  CT does not demonstrate any ureterolithiasis on the right however there is hydronephrosis, urinalysis is consistent with UTI, white blood cell count is normal, afebrile here.  However acute on chronic kidney injury noted on BMP.  Given significant UTI/pyelonephritis, kidney injury patient will require inpatient mission, have discussed with Dr. Sherre of the hospitalist team        FINAL CLINICAL IMPRESSION(S) / ED DIAGNOSES   Final diagnoses:  Pyelonephritis  Acute renal failure superimposed on chronic kidney disease,  unspecified acute renal failure type, unspecified CKD stage     Rx / DC Orders   ED Discharge Orders     None        Note:  This document was prepared using Dragon voice recognition software and may include unintentional dictation errors.   Arlander Charleston, MD 09/15/24 [phone]

## 2024-09-15 NOTE — Assessment & Plan Note (Addendum)
 Metoprolol  succinate 25 mg daily resumed for 09/16/2024 Hydralazine  5 mg IV every 6 hours as needed for SBP greater 175, 5 days ordered

## 2024-09-16 DIAGNOSIS — N390 Urinary tract infection, site not specified: Secondary | ICD-10-CM

## 2024-09-16 DIAGNOSIS — N136 Pyonephrosis: Secondary | ICD-10-CM | POA: Diagnosis not present

## 2024-09-16 DIAGNOSIS — N179 Acute kidney failure, unspecified: Secondary | ICD-10-CM | POA: Diagnosis not present

## 2024-09-16 LAB — BASIC METABOLIC PANEL WITH GFR
Anion gap: 9 (ref 5–15)
BUN: 71 mg/dL — ABNORMAL HIGH (ref 8–23)
CO2: 18 mmol/L — ABNORMAL LOW (ref 22–32)
Calcium: 8.9 mg/dL (ref 8.9–10.3)
Chloride: 104 mmol/L (ref 98–111)
Creatinine, Ser: 2.93 mg/dL — ABNORMAL HIGH (ref 0.61–1.24)
GFR, Estimated: 23 mL/min — ABNORMAL LOW (ref >60)
Glucose, Bld: 195 mg/dL — ABNORMAL HIGH (ref 70–99)
Potassium: 4.2 mmol/L (ref 3.5–5.1)
Sodium: 131 mmol/L — ABNORMAL LOW (ref 135–145)

## 2024-09-16 LAB — CBC
HCT: 36.2 % — ABNORMAL LOW (ref 39.0–52.0)
Hemoglobin: 11.9 g/dL — ABNORMAL LOW (ref 13.0–17.0)
MCH: 31.8 pg (ref 26.0–34.0)
MCHC: 32.9 g/dL (ref 30.0–36.0)
MCV: 96.8 fL (ref 80.0–100.0)
Platelets: 209 K/uL (ref 150–400)
RBC: 3.74 MIL/uL — ABNORMAL LOW (ref 4.22–5.81)
RDW: 13.1 % (ref 11.5–15.5)
WBC: 6 K/uL (ref 4.0–10.5)
nRBC: 0 % (ref 0.0–0.2)

## 2024-09-16 LAB — HEPATIC FUNCTION PANEL
ALT: 162 U/L — ABNORMAL HIGH (ref 0–44)
AST: 97 U/L — ABNORMAL HIGH (ref 15–41)
Albumin: 3.2 g/dL — ABNORMAL LOW (ref 3.5–5.0)
Alkaline Phosphatase: 211 U/L — ABNORMAL HIGH (ref 38–126)
Bilirubin, Direct: 0.3 mg/dL — ABNORMAL HIGH (ref 0.0–0.2)
Indirect Bilirubin: 0.6 mg/dL (ref 0.3–0.9)
Total Bilirubin: 0.9 mg/dL (ref 0.0–1.2)
Total Protein: 7.5 g/dL (ref 6.5–8.1)

## 2024-09-16 LAB — TROPONIN I (HIGH SENSITIVITY): Troponin I (High Sensitivity): 20 ng/L — ABNORMAL HIGH (ref <18)

## 2024-09-16 MED ORDER — OXYCODONE HCL 5 MG PO TABS
5.0000 mg | ORAL_TABLET | Freq: Four times a day (QID) | ORAL | Status: AC | PRN
  Administered 2024-09-16 – 2024-09-17 (×4): 5 mg via ORAL
  Filled 2024-09-16 (×4): qty 1

## 2024-09-16 MED ORDER — LACTATED RINGERS IV SOLN: INTRAVENOUS | Status: DC

## 2024-09-16 MED ORDER — LACTATED RINGERS IV SOLN: INTRAVENOUS | Status: AC

## 2024-09-16 MED ORDER — ASPIRIN 81 MG PO CHEW
81.0000 mg | CHEWABLE_TABLET | Freq: Every day | ORAL | Status: DC
  Administered 2024-09-16 – 2024-09-19 (×4): 81 mg via ORAL
  Filled 2024-09-16 (×4): qty 1

## 2024-09-16 NOTE — Progress Notes (Signed)
 Cardiac monitoring discontinued per Dr Rosilyn orders.

## 2024-09-16 NOTE — Consult Note (Cosign Needed Addendum)
 Urology Consult  I have been asked to see the patient by Dr. Jens, for evaluation and management of AKI, complicated UTI, left hydronephrosis, left renal and ureteral calculi.  Chief Complaint: Right flank pain, dysuria  History of Present Illness: Ronnie Mann is a 68 y.o. year old male with PMH CAD, HFrEF, and an extremely complex urologic history including a chronic left proximal ureteral stone with left renal atrophy, large left lower pole stone, and chronic right renal pelvic fullness managed at [facility] admitted yesterday with acute complicated UTI.  CT stone study on presentation notable for stable severe left hydronephrosis with left renal atrophy, left renal stones, and a proximal left ureteral stone.  On the right, there is mild right hydroureteronephrosis with multiple nonobstructing right renal stones.  No obstructing right ureteral stones..  Admission labs notable for creatinine 2.68 (baseline 1.7); white count 5.2; and UA with nitrites, >50 RBC/hpf, >50 WBC/hpf, and many bacteria.  On antibiotics as below, urine culture pending.  He is lying quietly in bed on my arrival and reports 10/10 pelvic pain.  He had 2 episodes of diarrhea last night and states this morning his heart feels funny. He states this has been associated with left upper extremity discomfort that feels similar to that time I had a heart attack.  Anti-infectives (From admission, onward)    Start     Dose/Rate Route Frequency Ordered Stop   09/16/24 1000  cefTRIAXone  (ROCEPHIN ) 2 g in sodium chloride  0.9 % 100 mL IVPB        2 g 200 mL/hr over 30 Minutes Intravenous Every 24 hours 09/15/24 1446 09/20/24 0959   09/15/24 1415  cefTRIAXone  (ROCEPHIN ) 2 g in sodium chloride  0.9 % 100 mL IVPB        2 g 200 mL/hr over 30 Minutes Intravenous  Once 09/15/24 1406 09/15/24 1538        Past Medical History:  Diagnosis Date   CHF (congestive heart failure) (HCC)    Coronary artery disease 05/01/2023   PCI/DES    HFrEF (heart failure with reduced ejection fraction) (HCC) 05/01/2023   Hiatal hernia    HLD (hyperlipidemia)    HTN (hypertension)    Ischemic cardiomyopathy 05/01/2023   LVEF 30-35%   Kidney stones    Pneumothorax     Past Surgical History:  Procedure Laterality Date   CORONARY STENT INTERVENTION N/A 05/01/2023   Procedure: CORONARY STENT INTERVENTION;  Surgeon: Darron Deatrice LABOR, MD;  Location: [facility] INVASIVE CV LAB;  Service: Cardiovascular;  Laterality: N/A;   CYSTOSCOPY W/ URETERAL STENT PLACEMENT Right 10/12/2023   Procedure: CYSTOSCOPY WITH STENT REPLACEMENT;  Surgeon: Twylla Glendia BROCKS, MD;  Location: [facility] ORS;  Service: Urology;  Laterality: Right;   LEFT HEART CATH AND CORONARY ANGIOGRAPHY N/A 05/01/2023   Procedure: LEFT HEART CATH AND CORONARY ANGIOGRAPHY;  Surgeon: Darron Deatrice LABOR, MD;  Location: [facility] INVASIVE CV LAB;  Service: Cardiovascular;  Laterality: N/A;    Home Medications:  Current Meds  Medication Sig   albuterol  (VENTOLIN  HFA) 108 (90 Base) MCG/ACT inhaler Inhale 2 puffs into the lungs every 6 (six) hours as needed for wheezing or shortness of breath.   aspirin  81 MG chewable tablet Chew 1 tablet (81 mg total) by mouth daily.   atorvastatin  (LIPITOR) 40 MG tablet Take 1 tablet (40 mg total) by mouth daily.   dextromethorphan -guaiFENesin  (MUCINEX  DM) 30-600 MG 12hr tablet Take 1 tablet by mouth 2 (two) times daily as needed for cough.   finasteride  (  PROSCAR ) 5 MG tablet Take 1 tablet (5 mg total) by mouth daily.   [Paused] furosemide  (LASIX ) 20 MG tablet Take 2 tablets (40 mg total) by mouth daily. (Patient taking differently: Take 20 mg by mouth daily.)   metoprolol  succinate (TOPROL -XL) 25 MG 24 hr tablet Take 1 tablet (25 mg total) by mouth daily.   nitroGLYCERIN  (NITROSTAT ) 0.4 MG SL tablet Place 1 tablet (0.4 mg total) under the tongue every 5 (five) minutes as needed for chest pain.   tamsulosin  (FLOMAX ) 0.4 MG CAPS capsule Take 1 capsule (0.4 mg total) by  mouth daily after supper.   traZODone  (DESYREL ) 50 MG tablet Take 1 tablet (50 mg total) by mouth at bedtime as needed for sleep.    Allergies:  Allergies  Allergen Reactions   Losartan  Other (See Comments)    Chest pain    Family History  Problem Relation Age of Onset   Heart disease Father     Social History:  reports that he has quit smoking. His smoking use included cigarettes. He has never been exposed to tobacco smoke. He has never used smokeless tobacco. He reports that he does not currently use alcohol. He reports current drug use. Drug: Marijuana.  ROS: A complete review of systems was performed.  All systems are negative except for pertinent findings as noted.  Physical Exam:  Vital signs in last 24 hours: Temp:  [97.7 F (36.5 C)-98.6 F (37 C)] 98.5 F (36.9 C) (10/27 1236) Pulse Rate:  [71-101] 79 (10/27 1236) Resp:  [12-20] 12 (10/27 1236) BP: (88-140)/(62-124) 140/124 (10/27 1236) SpO2:  [93 %-99 %] 95 % (10/27 1236) Weight:  [82.9 kg-89.8 kg] 82.9 kg (10/27 1042) Constitutional:  Alert and oriented, no acute distress HEENT: Stella AT, moist mucus membranes Cardiovascular: No clubbing, cyanosis, or edema Respiratory: Normal respiratory effort GU: Bilateral descended testicles.  No testicular swelling.  No epididymal swelling.  No scrotal swelling, redness, purulence, crepitus, or fluctuance. Skin: No rashes, bruises or suspicious lesions Lymph: No cervical or inguinal adenopathy Neurologic: Grossly intact, no focal deficits, moving all 4 extremities Psychiatric: Normal mood and affect  Laboratory Data:  Recent Labs    09/15/24 1232 09/16/24 0501  WBC 5.2 6.0  HGB 13.3 11.9*  HCT 38.5* 36.2*   Recent Labs    09/15/24 1232 09/16/24 0501  NA 128* 131*  K 4.3 4.2  CL 98 104  CO2 18* 18*  GLUCOSE 232* 195*  BUN 73* 71*  CREATININE 2.68* 2.93*  CALCIUM  9.6 8.9   Urinalysis    Component Value Date/Time   COLORURINE YELLOW (A) 09/15/2024 1248    APPEARANCEUR TURBID (A) 09/15/2024 1248   APPEARANCEUR Cloudy (A) 10/23/2023 1417   LABSPEC 1.013 09/15/2024 1248   PHURINE 5.0 09/15/2024 1248   GLUCOSEU NEGATIVE 09/15/2024 1248   HGBUR LARGE (A) 09/15/2024 1248   BILIRUBINUR NEGATIVE 09/15/2024 1248   BILIRUBINUR Negative 10/23/2023 1417   KETONESUR NEGATIVE 09/15/2024 1248   PROTEINUR 100 (A) 09/15/2024 1248   NITRITE POSITIVE (A) 09/15/2024 1248   LEUKOCYTESUR TRACE (A) 09/15/2024 1248   Results for orders placed or performed during the hospital encounter of 07/10/24  Urine Culture (for pregnant, neutropenic or urologic patients or patients with an indwelling urinary catheter)     Status: Abnormal   Collection Time: 07/10/24  1:43 PM   Specimen: Urine, Clean Catch  Result Value Ref Range Status   Specimen Description   Final    URINE, CLEAN CATCH Performed at [facility],  [address] [place], [place] [address]    Special Requests   Final    NONE Performed at [facility], [address] [place], [place] [address]    Culture 70,000 COLONIES/mL STAPHYLOCOCCUS AUREUS (A)  Final   Report Status 07/15/2024 FINAL  Final   Organism ID, Bacteria STAPHYLOCOCCUS AUREUS (A)  Final      Susceptibility   Staphylococcus aureus - MIC*    CIPROFLOXACIN <=0.5 SENSITIVE Sensitive     GENTAMICIN <=0.5 SENSITIVE Sensitive     NITROFURANTOIN <=16 SENSITIVE Sensitive     OXACILLIN 1 SENSITIVE Sensitive     TETRACYCLINE <=1 SENSITIVE Sensitive     VANCOMYCIN  1 SENSITIVE Sensitive     TRIMETH /SULFA  <=10 SENSITIVE Sensitive     RIFAMPIN <=0.5 SENSITIVE Sensitive     Inducible Clindamycin NEGATIVE Sensitive     LINEZOLID 2 SENSITIVE Sensitive     * 70,000 COLONIES/mL STAPHYLOCOCCUS AUREUS    Radiologic Imaging: CT Renal Stone Study Result Date: 09/15/2024 EXAM: CT UROGRAM 09/15/2024 01:18:26 PM TECHNIQUE: CT of the abdomen and pelvis was performed without the administration of intravenous contrast as per CT urogram  protocol. Multiplanar reformatted images as well as MIP urogram images are provided for review. Automated exposure control, iterative reconstruction, and/or weight based adjustment of the mA/kV was utilized to reduce the radiation dose to as low as reasonably achievable. COMPARISON: 07/10/2024 CLINICAL HISTORY: Abdominal/flank pain, stone suspected. R side flank and groin pain. FINDINGS: LOWER CHEST: Moderate to large hiatal hernia. LIVER: The liver is unremarkable. GALLBLADDER AND BILE DUCTS: Multiple large gallstones are identified within the gallbladder measuring up to 3.5 cm. No pericholecystic inflammatory changes identified. No biliary ductal dilatation. SPLEEN: No acute abnormality. PANCREAS: No acute abnormality. ADRENAL GLANDS: Unchanged left adrenal gland adenoma measuring 1.6 cm, image 22/2. Right adrenal gland adenoma is also unchanged measuring 1.1 cm, image 17/2. No follow-up imaging recommended. KIDNEYS, URETERS AND BLADDER: Multiple bilateral Bosniak class 1 and 2 cysts appear unchanged. No follow-up imaging recommended. There are several stones within the lower pole of the right kidney, which measure up to 5 mm, image 36/2. Mild right hydronephrosis and hydroureter with diffuse periureteral soft tissue stranding noted. There is no stone identified along the course of the right ureter. Multiple left renal calculi are also identified. The largest stone is in the inferior pole measuring 2.3 cm. Persistent severe left hydronephrosis with diffuse cortical thinning is identified. Persistent stone within the proximal left ureter just beyond the UPJ measures 8 mm, image 47/2. No focal bladder abnormality. GI AND BOWEL: Stomach demonstrates no acute abnormality. The appendix is visualized and normal in caliber, without wall thickening, periappendiceal inflammation, or fluid. There is no bowel obstruction. PERITONEUM AND RETROPERITONEUM: No ascites. No free air. VASCULATURE: Aorta is normal in caliber. Aortic  atherosclerotic calcifications. LYMPH NODES: No lymphadenopathy. REPRODUCTIVE ORGANS: Prostate gland appears normal. BONES AND SOFT TISSUES: No acute osseous abnormality. Small fat-containing left inguinal hernia. IMPRESSION: 1. Persistent severe left hydronephrosis with diffuse cortical thinning, with multiple left renal calculi including a largest stone measuring 2.3 cm, and an 8 mm stone in the proximal left ureter just beyond the UPJ. 2. Mild right hydronephrosis and hydroureter with multiple right renal stones measuring up to 5 mm, and no ureteral stone identified. This may reflect recent past right ureteral calculus or ascending urinary tract infection. 3. Gallstones 4. Hiatal hernia Electronically signed by: Waddell Calk MD 09/15/2024 01:32 PM EDT RP Workstation: [id number]   Assessment & Plan:  68  y.o. comorbid male with an extremely complex urologic history managed at [facility], see multiple prior urology notes, now admitted with right hydroureteronephrosis and complicated UTI with AKI.  No indication for urologic intervention at this time in the absence of evidence of new right-sided obstruction.  Despite reports of severe pelvic pain, his physical exam is rather unimpressive today.  Agree with antibiotics and supportive care per primary team.  I encourage outpatient follow-up with [facility] for management of his recurrent complicated infections and left sided stone burden.  I shared his reports of cardiac symptoms to Dr. Jens for further evaluation.  Thank you for involving me in this patient's care, please page with any further questions or concerns.  Lucie Hones, PA-C 09/16/2024 1:39 PM

## 2024-09-16 NOTE — Progress Notes (Addendum)
 Progress Note    Ronnie Mann  FMW:[id number] DOB: 1955/12/16  DOA: 09/15/2024 PCP: Bernardo Fend, DO      Brief Narrative:    Medical records reviewed and are as summarized below:  Ronnie Mann is a 68 y.o. 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  Vital signs in the ED: Temperature 98.2 F, respiratory 18, pulse 95, O2 sat 97% on room air, BP 110/83.  Labs significant for sodium 128, glucose 232, BUN 73, creatinine 2.68, bicarb 18, ALP 272, AST 183, ALT 239, total bilirubin 1.6, GFR 25.  WBC was normal.  Urinalysis with turbid appearance trace leukocytes, positive nitrite, many bacteria, WBCs greater than 50 hpf, RBCs greater than 50 hpf  CT renal stone IMPRESSION: 1. Persistent severe left hydronephrosis with diffuse cortical thinning, with multiple left renal calculi including a largest stone measuring 2.3 cm, and an 8 mm stone in the proximal left ureter just beyond the UPJ. 2. Mild right hydronephrosis and hydroureter with multiple right renal stones measuring up to 5 mm, and no ureteral stone identified. This may reflect recent past right ureteral calculus or ascending urinary tract infection. 3. Gallstones 4. Hiatal hernia    He he was admitted to the hospital for acute complicated UTI, AKI on CKD stage III A.     Assessment/Plan:   Principal Problem:   AKI (acute kidney injury) Active Problems:   Complicated UTI (urinary tract infection)   HLD (hyperlipidemia)   Hydronephrosis of left kidney   Essential hypertension   CAD (coronary  artery disease)   CKD stage 3a, GFR 45-59 ml/min (HCC)   Cardiomyopathy (HCC)   CAD in native artery   Calculus of kidney with calculus of ureter   Dysuria   Body mass index is 26.99 kg/m.    Acute complicated UTI: Continue IV ceftriaxone .  Analgesics as needed for pain.  Follow-up urine and blood cultures.   Persistent severe left hydronephrosis, multiple left renal calculi including larger stone measuring 2.3 cm and 8 mm left proximal ureteral stone beyond the UPJ, mild right hydronephrosis and hydroureter with multiple renal stones: Continue Flomax  and finasteride . Consulted urologist, Dr. Twylla, to assist with management. Of note, he was hospitalized from 07/10/2024 through 07/15/2023 for complicated UTI and multiple renal calculi.  Radiologist had offered cystoscopy but patient declined.   AKI on CKD stage IIIa: Worsening AKI.  Creatinine up from 2.6-2.9.  Change IV fluids from normal saline to Ringer's lactate infusion and decrease rate from 125 mL/h to 50 mL/h. Consulted nephrologist, Dr. Dennise, to assist with management. Baseline creatinine around 1.5-1.7.  Diarrhea likely contributing to worsening AKI.  Diarrhea may be due to antibiotics.  Continue to monitor.   Chronic systolic and diastolic CHF: Compensated. Continue metoprolol .  He was not taking Entresto  prior to admission. 2D echo in August 2025 showed EF estimated at 25 to 30%, grade 1 diastolic dysfunction, severely elevated pulmonary artery systolic pressure, mild to moderate MR.   Elevated liver enzymes: Lipitor on hold.  Repeat liver enzymes.   CAD with previous MI s/p coronary stent on 05/01/2023, chronically elevated troponins: Resume low-dose aspirin .  Check troponin because of recent report of left arm pain    Comorbidities include hypertension, hyperlipidemia    Diet Order             Diet Heart Room service appropriate? Yes; Fluid consistency: Thin  Diet effective now                                   Consultants: Nephrologist Neurologist  Procedures: None    Medications:    aspirin   81 mg Oral Daily   finasteride   5 mg Oral Daily   heparin   5,000 Units Subcutaneous Q8H   metoprolol  succinate  25 mg Oral Daily   tamsulosin   0.4 mg Oral QPC supper   Continuous Infusions:  cefTRIAXone  (ROCEPHIN )  IV     lactated ringers        Anti-infectives (From admission, onward)    Start     Dose/Rate Route Frequency Ordered Stop   09/16/24 1000  cefTRIAXone  (ROCEPHIN ) 2 g in sodium chloride  0.9 % 100 mL IVPB        2 g 200 mL/hr over 30 Minutes Intravenous Every 24 hours 09/15/24 1446 09/20/24 0959   09/15/24 1415  cefTRIAXone  (ROCEPHIN ) 2 g in sodium chloride  0.9 % 100 mL IVPB        2 g 200 mL/hr over 30 Minutes Intravenous  Once 09/15/24 1406 09/15/24 1538              Family Communication/Anticipated D/C date and plan/Code Status   DVT prophylaxis: heparin  injection 5,000 Units Start: 09/15/24 2200 Place TED hose Start: 09/15/24 1429     Code Status: Full Code  Family Communication: None Disposition Plan: Plan to discharge home   Status is: Inpatient Remains inpatient appropriate because: Acute complicated UTI       Subjective:   Interval events noted.  He complains of severe pain in the pelvic area and burning urination.  He is okay with urologic evaluation because of significant pelvic pain.  He said he had 2 watery stools last night after eating a sandwich.  He did not have any diarrhea prior to admission.  He also had pain in the left arm last night when he had diarrhea and pelvic pain last night.  He said this normally occurs when he has pelvic pain.  Pain felt similar to when he had his heart attack.  Currently, he has no chest pain or pain in the upper extremities.  No shortness of breath.  Objective:    Vitals:   09/16/24 0103 09/16/24 0347 09/16/24 0832 09/16/24 1042  BP: 107/78 (!) 128/102 130/77   Pulse:  95 93 (!) 101   Resp: 20 19 16    Temp: 97.9 F (36.6 C) 98.1 F (36.7 C) 98.6 F (37 C)   TempSrc: Oral Oral Oral   SpO2: 99% 96% 95%   Weight:    82.9 kg  Height:    5' 9 (1.753 m)   No data found.   Intake/Output Summary (Last 24 hours) at 09/16/2024 1054 Last data filed at 09/16/2024 0700 Gross per 24 hour  Intake 1884.32 ml  Output 600 ml  Net 1284.32 ml   Filed Weights   09/15/24 1438 09/16/24 1042  Weight: 89.8 kg 82.9 kg    Exam:  GEN: NAD SKIN: Warm and dry EYES: No pallor or icterus ENT: MMM CV: RRR PULM: CTA B ABD: soft, ND, suprapubic tenderness, right flank  tenderness, +BS CNS: AAO x 3, non focal EXT: No edema or tenderness        Data Reviewed:   I have personally reviewed following labs and imaging studies:  Labs: Labs show the following:   Basic Metabolic Panel: Recent Labs  Lab 09/15/24 1232 09/16/24 0501  NA 128* 131*  K 4.3 4.2  CL 98 104  CO2 18* 18*  GLUCOSE 232* 195*  BUN 73* 71*  CREATININE 2.68* 2.93*  CALCIUM  9.6 8.9   GFR Estimated Creatinine Clearance: 24.1 mL/min (A) (by C-G formula based on SCr of 2.93 mg/dL (H)). Liver Function Tests: Recent Labs  Lab 09/15/24 1232  AST 183*  ALT 239*  ALKPHOS 272*  BILITOT 1.6*  PROT 7.9  ALBUMIN  3.9   Recent Labs  Lab 09/15/24 1232  LIPASE 27   No results for input(s): AMMONIA in the last 168 hours. Coagulation profile No results for input(s): INR, PROTIME in the last 168 hours.  CBC: Recent Labs  Lab 09/15/24 1232 09/16/24 0501  WBC 5.2 6.0  HGB 13.3 11.9*  HCT 38.5* 36.2*  MCV 93.9 96.8  PLT 272 209   Cardiac Enzymes: No results for input(s): CKTOTAL, CKMB, CKMBINDEX, TROPONINI in the last 168 hours. BNP (last 3 results) Recent Labs    12/21/23 1421  PROBNP 1,262*   CBG: No results for input(s): GLUCAP in the last 168 hours. D-Dimer: No results for input(s): DDIMER in the last 72 hours. Hgb A1c: No results for input(s):  HGBA1C in the last 72 hours. Lipid Profile: No results for input(s): CHOL, HDL, LDLCALC, TRIG, CHOLHDL, LDLDIRECT in the last 72 hours. Thyroid function studies: No results for input(s): TSH, T4TOTAL, T3FREE, THYROIDAB in the last 72 hours.  Invalid input(s): FREET3 Anemia work up: No results for input(s): VITAMINB12, FOLATE, FERRITIN, TIBC, IRON , RETICCTPCT in the last 72 hours. Sepsis Labs: Recent Labs  Lab 09/15/24 1232 09/16/24 0501  WBC 5.2 6.0    Microbiology No results found for this or any previous visit (from the past 240 hours).  Procedures and diagnostic studies:  CT Renal Stone Study Result Date: 09/15/2024 EXAM: CT UROGRAM 09/15/2024 01:18:26 PM TECHNIQUE: CT of the abdomen and pelvis was performed without the administration of intravenous contrast as per CT urogram protocol. Multiplanar reformatted images as well as MIP urogram images are provided for review. Automated exposure control, iterative reconstruction, and/or weight based adjustment of the mA/kV was utilized to reduce the radiation dose to as low as reasonably achievable. COMPARISON: 07/10/2024 CLINICAL HISTORY: Abdominal/flank pain, stone suspected. R side flank and groin pain. FINDINGS: LOWER CHEST: Moderate to large hiatal hernia. LIVER: The liver is unremarkable. GALLBLADDER AND BILE DUCTS: Multiple large gallstones are identified within the gallbladder measuring up to 3.5 cm. No pericholecystic inflammatory changes identified. No biliary ductal dilatation. SPLEEN: No acute abnormality. PANCREAS: No acute abnormality. ADRENAL GLANDS: Unchanged left adrenal gland adenoma measuring 1.6 cm, image 22/2. Right adrenal gland adenoma is also unchanged measuring 1.1 cm, image 17/2. No follow-up imaging recommended. KIDNEYS, URETERS AND BLADDER: Multiple bilateral Bosniak class 1 and 2 cysts appear unchanged. No follow-up imaging recommended. There are several stones within the lower pole  of the right kidney, which measure up to 5 mm, image 36/2. Mild right hydronephrosis and hydroureter with diffuse periureteral soft tissue stranding noted. There is no stone identified along the course of the right ureter. Multiple left renal calculi are also identified. The largest stone is in the inferior pole measuring 2.3 cm. Persistent severe  left hydronephrosis with diffuse cortical thinning is identified. Persistent stone within the proximal left ureter just beyond the UPJ measures 8 mm, image 47/2. No focal bladder abnormality. GI AND BOWEL: Stomach demonstrates no acute abnormality. The appendix is visualized and normal in caliber, without wall thickening, periappendiceal inflammation, or fluid. There is no bowel obstruction. PERITONEUM AND RETROPERITONEUM: No ascites. No free air. VASCULATURE: Aorta is normal in caliber. Aortic atherosclerotic calcifications. LYMPH NODES: No lymphadenopathy. REPRODUCTIVE ORGANS: Prostate gland appears normal. BONES AND SOFT TISSUES: No acute osseous abnormality. Small fat-containing left inguinal hernia. IMPRESSION: 1. Persistent severe left hydronephrosis with diffuse cortical thinning, with multiple left renal calculi including a largest stone measuring 2.3 cm, and an 8 mm stone in the proximal left ureter just beyond the UPJ. 2. Mild right hydronephrosis and hydroureter with multiple right renal stones measuring up to 5 mm, and no ureteral stone identified. This may reflect recent past right ureteral calculus or ascending urinary tract infection. 3. Gallstones 4. Hiatal hernia Electronically signed by: Waddell Calk MD 09/15/2024 01:32 PM EDT RP Workstation: [id number]               LOS: 1 day   Sanjiv Castorena  [facility]   Pager on [email or web address]. If 7PM-7AM, please contact night-coverage at [email or web address]     09/16/2024, 10:54 AM

## 2024-09-16 NOTE — TOC Initial Note (Signed)
 Transition of Care [facility]) - Initial/Assessment Note    Patient Details  Name: Ronnie Mann MRN: [id number] Date of Birth: [birth year]  Transition of Care [facility]) CM/SW Contact:    Dalia GORMAN Fuse, RN Phone Number: 09/16/2024, 10:23 AM  Clinical Narrative:                 Patient presented from home with his son. He is on IV abx for a UTI and is also noted to have kidney stones. The patient declined urology consult at this time. He is able to drive himself to appointments and to run errands. He has a PCP, Dr. Bernardo and uses [facility] on [address]. No home health prior to admission. He has a RW that he uses when needed. He also has a BSC and shower chair.   TOC will continue to follow for discharge planning.  Expected Discharge Plan: Home/Self Care Barriers to Discharge: Continued Medical Work up   Patient Goals and CMS Choice            Expected Discharge Plan and Services   Discharge Planning Services: CM Consult                                          Prior Living Arrangements/Services   Lives with:: Adult Children              Current home services: DME    Activities of Daily Living   ADL Screening (condition at time of admission) Independently performs ADLs?: Yes (appropriate for developmental age)  Permission Sought/Granted                  Emotional Assessment              Admission diagnosis:  Pyelonephritis [N12] Complicated UTI (urinary tract infection) [N39.0] Acute renal failure superimposed on chronic kidney disease, unspecified acute renal failure type, unspecified CKD stage [N17.9, N18.9] Patient Active Problem List   Diagnosis Date Noted   Dysuria 09/15/2024   Calculus of kidney with calculus of ureter 07/14/2024   Elevated troponin 07/13/2024   Overweight (BMI 25.0-29.9) 07/10/2024   Hyperkalemia 05/14/2024   Acute on chronic combined systolic and diastolic CHF (congestive heart failure) (HCC) 05/10/2024   History  of pulmonary embolus (PE) 05/10/2024   Hypokalemia 05/10/2024   Pulmonary embolism without acute cor pulmonale (HCC) 01/31/2024   History of hematuria 01/31/2024   Unstable angina (HCC) 01/30/2024   CKD stage 3a, GFR 45-59 ml/min (HCC) 01/30/2024   History of non-ST elevation myocardial infarction (NSTEMI) 11/27/2023   Prediabetes 11/27/2023   Psychophysiological insomnia 11/27/2023   Right flank pain 11/04/2023   Normocytic anemia 11/04/2023   Acute pyelonephritis 11/04/2023   Fall at home, initial encounter 11/04/2023   Cough 11/04/2023   Ureteral stone with hydronephrosis 11/04/2023   Acute bronchitis 10/11/2023   Myocardial injury 10/10/2023   HLD (hyperlipidemia) 10/10/2023   CAD (coronary artery disease) 10/10/2023   Obesity (BMI 30-39.9) 10/10/2023   Complicated UTI (urinary tract infection) 10/10/2023   Hydronephrosis 10/10/2023   Cardiomyopathy (HCC) 05/20/2023   CAD in native artery 05/20/2023   Chronic systolic (congestive) heart failure (HCC) 05/17/2023   Gross hematuria 05/16/2023   UTI symptoms 05/16/2023   Symptomatic anemia 05/16/2023   Hematuria 05/16/2023   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 heart with stable angina pectoris 05/04/2023   Ischemic cardiomyopathy  05/03/2023   Acute hyperglycemia 05/02/2023   Acute systolic heart failure (HCC) 05/01/2023   Non-ST elevation (NSTEMI) myocardial infarction [facility]) 04/29/2023   CHF (congestive heart failure) (HCC) 04/28/2023   Substernal chest pain 04/28/2023   Recurrent nephrolithiasis 04/28/2023   Lactic acidosis 04/28/2023   AKI (acute kidney injury) 04/28/2023   Hydronephrosis of left kidney 04/28/2023   Wheezing 04/28/2023   Essential hypertension 04/28/2023   Hiatal hernia 04/28/2023   Cholelithiasis 04/28/2023   Shortness of breath 04/28/2023   Chest pain due to myocardial ischemia 04/28/2023   Viral URI 04/28/2023   PCP:  Bernardo Fend, DO Pharmacy:   [facility] [address] [place] [address] Phone: [phone] Fax: [phone]  [facility] [address] [place] - [address] [place] [address] Phone: [phone] Fax: [phone]     Social Drivers of Health (SDOH) Social History: SDOH Screenings   Food Insecurity: No Food Insecurity (09/15/2024)  Housing: Low Risk  (09/15/2024)  Transportation Needs: No Transportation Needs (09/15/2024)  Utilities: Not At Risk (09/15/2024)  Recent Concern: Utilities - At Risk (07/12/2024)  Alcohol Screen: Low Risk  (11/27/2023)  Depression (PHQ2-9): Low Risk  (02/22/2024)  Financial Resource Strain: Low Risk  (01/23/2024)   Received from [facility]  Social Connections: Moderately Isolated (09/15/2024)  Tobacco Use: Medium Risk (09/15/2024)   SDOH Interventions:     Readmission Risk Interventions    07/12/2024   12:21 PM 11/06/2023   12:27 PM  Readmission Risk Prevention Plan  Transportation Screening Complete Complete  PCP or Specialist Appt within 3-5 Days Complete Complete  HRI or Home Care Consult  Complete  Social Work Consult for Recovery Care Planning/Counseling Complete Complete  Palliative Care Screening Not Applicable Not Applicable  Medication Review Oceanographer) Referral to Pharmacy

## 2024-09-16 NOTE — Consult Note (Addendum)
 [facility]  CONSULT NOTE    Date: 09/16/2024                  Patient Name:  Ronnie Mann  MRN: [id number]  DOB: [birth year]  Age / Sex: 68 y.o., male         PCP: Bernardo Fend, DO                 Service Requesting Consult: TRH                 Reason for Consult: Acute kidney injury            History of Present Illness: Mr. Ronnie Mann is a 68 y.o.  male with hypertension, hyperlipidemia, insomnia, BPH, kidney stones and chronic kidney disease stage III A, who was admitted to [facility] on 09/15/2024 for Pyelonephritis [N12] Complicated UTI (urinary tract infection) [N39.0] Acute renal failure superimposed on chronic kidney disease, unspecified acute renal failure type, unspecified CKD stage [N17.9, N18.9]  Patient presents to the emergency department with right sided pain.  Patient has extensive history of renal calculi, was followed by urology outpatient until earlier this year.  Patient seen restless lying on bed.  Complaining of lower back and right groin pain.  States he has this pain when he has a kidney stone.  States he has had kidney stones since he was age 4.  Has had multiple procedures since that time.  Labs on ED arrival concerning for sodium 128, serum bicarb 18, glucose 232, BUN 73, creatinine 2.68 with GFR 25.  UA appears turbid with hematuria and mild proteinuria.  Urine culture pending.  CT renal stone shows a persistent severe left hydronephrosis with multiple left renal calculi measuring 2.3 to 8 mm.  Also shows a mild right hydronephrosis with hydroureter with multiple right renal stones measuring up to 5 mm.  Patient has been followed by [facility] urology and underwent R percutaneous nephrolithotomy and ureteral stent placement on 01/22/2024.  Medications: Outpatient medications: Medications Prior to Admission  Medication Sig Dispense Refill Last Dose/Taking   albuterol  (VENTOLIN  HFA) 108 (90 Base) MCG/ACT inhaler Inhale 2 puffs into the lungs  every 6 (six) hours as needed for wheezing or shortness of breath. 17 each 0 Unknown   aspirin  81 MG chewable tablet Chew 1 tablet (81 mg total) by mouth daily. 90 tablet 2 09/14/2024   atorvastatin  (LIPITOR) 40 MG tablet Take 1 tablet (40 mg total) by mouth daily. 90 tablet 3 09/14/2024   dextromethorphan -guaiFENesin  (MUCINEX  DM) 30-600 MG 12hr tablet Take 1 tablet by mouth 2 (two) times daily as needed for cough. 30 tablet 0 Unknown   finasteride  (PROSCAR ) 5 MG tablet Take 1 tablet (5 mg total) by mouth daily. 30 tablet 2 09/14/2024   [Paused] furosemide  (LASIX ) 20 MG tablet Take 2 tablets (40 mg total) by mouth daily. (Patient taking differently: Take 20 mg by mouth daily.) 60 tablet 5 09/14/2024   metoprolol  succinate (TOPROL -XL) 25 MG 24 hr tablet Take 1 tablet (25 mg total) by mouth daily. 90 tablet 3 09/14/2024   nitroGLYCERIN  (NITROSTAT ) 0.4 MG SL tablet Place 1 tablet (0.4 mg total) under the tongue every 5 (five) minutes as needed for chest pain. 25 tablet 5 Unknown   tamsulosin  (FLOMAX ) 0.4 MG CAPS capsule Take 1 capsule (0.4 mg total) by mouth daily after supper. 30 capsule 2 09/14/2024   traZODone  (DESYREL ) 50 MG tablet Take 1 tablet (50 mg total) by mouth at bedtime  as needed for sleep. 90 tablet 0 Unknown   ENTRESTO  24-26 MG Take 1 tablet by mouth 2 (two) times daily. (Patient not taking: Reported on 09/15/2024)   Not Taking   Fe Fum-Vit C-Vit B12-FA (TRIGELS-F FORTE) CAPS capsule Take 1 capsule by mouth 2 (two) times daily. (Patient not taking: Reported on 09/15/2024) 180 capsule 0 Not Taking   oxyCODONE -acetaminophen  (PERCOCET/ROXICET) 5-325 MG tablet Take 1 tablet by mouth every 4 (four) hours as needed for moderate pain (pain score 4-6). (Patient not taking: Reported on 09/15/2024) 15 tablet 0 Not Taking   solifenacin (VESICARE) 10 MG tablet Take 10 mg by mouth daily. (Patient not taking: No sig reported)   Not Taking    Current medications: Current Facility-Administered  Medications  Medication Dose Route Frequency Provider Last Rate Last Admin   acetaminophen  (TYLENOL ) tablet 650 mg  650 mg Oral Q6H PRN Cox, Amy N, DO   650 mg at 09/16/24 1148   Or   acetaminophen  (TYLENOL ) suppository 650 mg  650 mg Rectal Q6H PRN Cox, Amy N, DO       albuterol  (PROVENTIL ) (2.5 MG/3ML) 0.083% nebulizer solution 2.5 mg  2.5 mg Inhalation Q6H PRN Cox, Amy N, DO       aspirin  chewable tablet 81 mg  81 mg Oral Daily Jens Durand, MD   81 mg at 09/16/24 1148   cefTRIAXone  (ROCEPHIN ) 2 g in sodium chloride  0.9 % 100 mL IVPB  2 g Intravenous Q24H Cox, Amy N, DO 200 mL/hr at 09/16/24 1147 2 g at 09/16/24 1147   dextromethorphan -guaiFENesin  (MUCINEX  DM) 30-600 MG per 12 hr tablet 1 tablet  1 tablet Oral BID PRN Cox, Amy N, DO       finasteride  (PROSCAR ) tablet 5 mg  5 mg Oral Daily Cox, Amy N, DO   5 mg at 09/16/24 1148   heparin  injection 5,000 Units  5,000 Units Subcutaneous Q8H Cox, Amy N, DO   5,000 Units at 09/16/24 1349   hydrALAZINE  (APRESOLINE ) injection 5 mg  5 mg Intravenous Q6H PRN Cox, Amy N, DO       lactated ringers  infusion   Intravenous Continuous Jens Durand, MD 50 mL/hr at 09/16/24 1349 New Bag at 09/16/24 1349   metoprolol  succinate (TOPROL -XL) 24 hr tablet 25 mg  25 mg Oral Daily Cox, Amy N, DO   25 mg at 09/16/24 1147   nitroGLYCERIN  (NITROSTAT ) SL tablet 0.4 mg  0.4 mg Sublingual Q5 min PRN Cox, Amy N, DO       ondansetron  (ZOFRAN ) tablet 4 mg  4 mg Oral Q6H PRN Cox, Amy N, DO       Or   ondansetron  (ZOFRAN ) injection 4 mg  4 mg Intravenous Q6H PRN Cox, Amy N, DO       oxyCODONE  (Oxy IR/ROXICODONE ) immediate release tablet 5 mg  5 mg Oral Q6H PRN Cox, Amy N, DO   5 mg at 09/16/24 1148   senna-docusate (Senokot-S) tablet 1 tablet  1 tablet Oral QHS PRN Cox, Amy N, DO       tamsulosin  (FLOMAX ) capsule 0.4 mg  0.4 mg Oral QPC supper Cox, Amy N, DO   0.4 mg at 09/15/24 1700   traZODone  (DESYREL ) tablet 50 mg  50 mg Oral QHS PRN Cox, Amy N, DO   50 mg at 09/15/24  2124      Allergies: Allergies  Allergen Reactions   Losartan  Other (See Comments)    Chest pain      Past  Medical History: Past Medical History:  Diagnosis Date   CHF (congestive heart failure) (HCC)    Coronary artery disease 05/01/2023   PCI/DES   HFrEF (heart failure with reduced ejection fraction) (HCC) 05/01/2023   Hiatal hernia    HLD (hyperlipidemia)    HTN (hypertension)    Ischemic cardiomyopathy 05/01/2023   LVEF 30-35%   Kidney stones    Pneumothorax      Past Surgical History: Past Surgical History:  Procedure Laterality Date   CORONARY STENT INTERVENTION N/A 05/01/2023   Procedure: CORONARY STENT INTERVENTION;  Surgeon: Darron Deatrice LABOR, MD;  Location: [facility] INVASIVE CV LAB;  Service: Cardiovascular;  Laterality: N/A;   CYSTOSCOPY W/ URETERAL STENT PLACEMENT Right 10/12/2023   Procedure: CYSTOSCOPY WITH STENT REPLACEMENT;  Surgeon: Twylla Glendia BROCKS, MD;  Location: [facility] ORS;  Service: Urology;  Laterality: Right;   LEFT HEART CATH AND CORONARY ANGIOGRAPHY N/A 05/01/2023   Procedure: LEFT HEART CATH AND CORONARY ANGIOGRAPHY;  Surgeon: Darron Deatrice LABOR, MD;  Location: [facility] INVASIVE CV LAB;  Service: Cardiovascular;  Laterality: N/A;     Family History: Family History  Problem Relation Age of Onset   Heart disease Father      Social History: Social History   Socioeconomic History   Marital status: Divorced    Spouse name: Not on file   Number of children: Not on file   Years of education: Not on file   Highest education level: 10th grade  Occupational History   Not on file  Tobacco Use   Smoking status: Former    Types: Cigarettes    Passive exposure: Never   Smokeless tobacco: Never  Vaping Use   Vaping status: Never Used  Substance and Sexual Activity   Alcohol use: Not Currently    Comment: former moderate drinker   Drug use: Yes    Types: Marijuana   Sexual activity: Not Currently  Other Topics Concern   Not on file  Social  History Narrative   Not on file   Social Drivers of Health   Financial Resource Strain: Low Risk  (01/23/2024)   Received from [facility]   Overall Financial Resource Strain (CARDIA)    Difficulty of Paying Living Expenses: Not hard at all  Food Insecurity: No Food Insecurity (09/15/2024)   Hunger Vital Sign    Worried About Running Out of Food in the Last Year: Never true    Ran Out of Food in the Last Year: Never true  Transportation Needs: No Transportation Needs (09/15/2024)   PRAPARE - Administrator, Civil Service (Medical): No    Lack of Transportation (Non-Medical): No  Physical Activity: Not on file  Stress: Not on file  Social Connections: Moderately Isolated (09/15/2024)   Social Connection and Isolation Panel    Frequency of Communication with Friends and Family: More than three times a week    Frequency of Social Gatherings with Friends and Family: More than three times a week    Attends Religious Services: More than 4 times per year    Active Member of [employer] or Organizations: No    Attends Banker Meetings: Never    Marital Status: Divorced  Catering Manager Violence: Not At Risk (09/15/2024)   Humiliation, Afraid, Rape, and Kick questionnaire    Fear of Current or Ex-Partner: No    Emotionally Abused: No    Physically Abused: No    Sexually Abused: No     Review of Systems: Review of  Systems  Constitutional:  Negative for chills, fever and malaise/fatigue.  HENT:  Negative for congestion, sore throat and tinnitus.   Eyes:  Negative for blurred vision and redness.  Respiratory:  Negative for cough, shortness of breath and wheezing.   Cardiovascular:  Negative for chest pain, palpitations, claudication and leg swelling.  Gastrointestinal:  Negative for abdominal pain, blood in stool, diarrhea, nausea and vomiting.  Genitourinary:  Positive for flank pain. Negative for frequency and hematuria.  Musculoskeletal:  Positive for back  pain. Negative for falls and myalgias.  Skin:  Negative for rash.  Neurological:  Negative for dizziness, weakness and headaches.  Endo/Heme/Allergies:  Does not bruise/bleed easily.  Psychiatric/Behavioral:  Negative for depression. The patient is not nervous/anxious and does not have insomnia.     Vital Signs: Blood pressure (!) 140/124, pulse 79, temperature 98.5 F (36.9 C), temperature source Oral, resp. rate 12, height 5' 9 (1.753 m), weight 82.9 kg, SpO2 95%.  Weight trends: Filed Weights   09/15/24 1438 09/16/24 1042  Weight: 89.8 kg 82.9 kg    Physical Exam: General: Restless, grimacing  Head: Normocephalic, atraumatic. Moist oral mucosal membranes  Eyes: Anicteric  Neck: Supple  Lungs:  Clear to auscultation, normal effort  Heart: Regular rate and rhythm  Abdomen:  Soft, nontender,   Extremities:  No peripheral edema.  Neurologic: Awake and alert  Skin: No lesions        Lab results: Basic Metabolic Panel: Recent Labs  Lab 09/15/24 1232 09/16/24 0501  NA 128* 131*  K 4.3 4.2  CL 98 104  CO2 18* 18*  GLUCOSE 232* 195*  BUN 73* 71*  CREATININE 2.68* 2.93*  CALCIUM  9.6 8.9    Liver Function Tests: Recent Labs  Lab 09/15/24 1232 09/16/24 0501  AST 183* 97*  ALT 239* 162*  ALKPHOS 272* 211*  BILITOT 1.6* 0.9  PROT 7.9 7.5  ALBUMIN  3.9 3.2*   Recent Labs  Lab 09/15/24 1232  LIPASE 27   No results for input(s): AMMONIA in the last 168 hours.  CBC: Recent Labs  Lab 09/15/24 1232 09/16/24 0501  WBC 5.2 6.0  HGB 13.3 11.9*  HCT 38.5* 36.2*  MCV 93.9 96.8  PLT 272 209    Cardiac Enzymes: No results for input(s): CKTOTAL, CKMB, CKMBINDEX, TROPONINI in the last 168 hours.  BNP: Invalid input(s): POCBNP  CBG: No results for input(s): GLUCAP in the last 168 hours.  Microbiology: Results for orders placed or performed during the hospital encounter of 07/10/24  Urine Culture (for pregnant, neutropenic or urologic  patients or patients with an indwelling urinary catheter)     Status: Abnormal   Collection Time: 07/10/24  1:43 PM   Specimen: Urine, Clean Catch  Result Value Ref Range Status   Specimen Description   Final    URINE, CLEAN CATCH Performed at [facility], [address], [place], [place] [address]    Special Requests   Final    NONE Performed at [facility], [address] [place], [place] [address]    Culture 70,000 COLONIES/mL STAPHYLOCOCCUS AUREUS (A)  Final   Report Status 07/15/2024 FINAL  Final   Organism ID, Bacteria STAPHYLOCOCCUS AUREUS (A)  Final      Susceptibility   Staphylococcus aureus - MIC*    CIPROFLOXACIN <=0.5 SENSITIVE Sensitive     GENTAMICIN <=0.5 SENSITIVE Sensitive     NITROFURANTOIN <=16 SENSITIVE Sensitive     OXACILLIN 1 SENSITIVE Sensitive     TETRACYCLINE <=1  SENSITIVE Sensitive     VANCOMYCIN  1 SENSITIVE Sensitive     TRIMETH /SULFA  <=10 SENSITIVE Sensitive     RIFAMPIN <=0.5 SENSITIVE Sensitive     Inducible Clindamycin NEGATIVE Sensitive     LINEZOLID 2 SENSITIVE Sensitive     * 70,000 COLONIES/mL STAPHYLOCOCCUS AUREUS    Coagulation Studies: No results for input(s): LABPROT, INR in the last 72 hours.  Urinalysis: Recent Labs    09/15/24 1248  COLORURINE YELLOW*  LABSPEC 1.013  PHURINE 5.0  GLUCOSEU NEGATIVE  HGBUR LARGE*  BILIRUBINUR NEGATIVE  KETONESUR NEGATIVE  PROTEINUR 100*  NITRITE POSITIVE*  LEUKOCYTESUR TRACE*      Imaging: CT Renal Stone Study Result Date: 09/15/2024 EXAM: CT UROGRAM 09/15/2024 01:18:26 PM TECHNIQUE: CT of the abdomen and pelvis was performed without the administration of intravenous contrast as per CT urogram protocol. Multiplanar reformatted images as well as MIP urogram images are provided for review. Automated exposure control, iterative reconstruction, and/or weight based adjustment of the mA/kV was utilized to reduce the radiation dose to as low as reasonably achievable.  COMPARISON: 07/10/2024 CLINICAL HISTORY: Abdominal/flank pain, stone suspected. R side flank and groin pain. FINDINGS: LOWER CHEST: Moderate to large hiatal hernia. LIVER: The liver is unremarkable. GALLBLADDER AND BILE DUCTS: Multiple large gallstones are identified within the gallbladder measuring up to 3.5 cm. No pericholecystic inflammatory changes identified. No biliary ductal dilatation. SPLEEN: No acute abnormality. PANCREAS: No acute abnormality. ADRENAL GLANDS: Unchanged left adrenal gland adenoma measuring 1.6 cm, image 22/2. Right adrenal gland adenoma is also unchanged measuring 1.1 cm, image 17/2. No follow-up imaging recommended. KIDNEYS, URETERS AND BLADDER: Multiple bilateral Bosniak class 1 and 2 cysts appear unchanged. No follow-up imaging recommended. There are several stones within the lower pole of the right kidney, which measure up to 5 mm, image 36/2. Mild right hydronephrosis and hydroureter with diffuse periureteral soft tissue stranding noted. There is no stone identified along the course of the right ureter. Multiple left renal calculi are also identified. The largest stone is in the inferior pole measuring 2.3 cm. Persistent severe left hydronephrosis with diffuse cortical thinning is identified. Persistent stone within the proximal left ureter just beyond the UPJ measures 8 mm, image 47/2. No focal bladder abnormality. GI AND BOWEL: Stomach demonstrates no acute abnormality. The appendix is visualized and normal in caliber, without wall thickening, periappendiceal inflammation, or fluid. There is no bowel obstruction. PERITONEUM AND RETROPERITONEUM: No ascites. No free air. VASCULATURE: Aorta is normal in caliber. Aortic atherosclerotic calcifications. LYMPH NODES: No lymphadenopathy. REPRODUCTIVE ORGANS: Prostate gland appears normal. BONES AND SOFT TISSUES: No acute osseous abnormality. Small fat-containing left inguinal hernia. IMPRESSION: 1. Persistent severe left hydronephrosis with  diffuse cortical thinning, with multiple left renal calculi including a largest stone measuring 2.3 cm, and an 8 mm stone in the proximal left ureter just beyond the UPJ. 2. Mild right hydronephrosis and hydroureter with multiple right renal stones measuring up to 5 mm, and no ureteral stone identified. This may reflect recent past right ureteral calculus or ascending urinary tract infection. 3. Gallstones 4. Hiatal hernia Electronically signed by: Waddell Calk MD 09/15/2024 01:32 PM EDT RP Workstation: [id number]     Assessment & Plan: Mr. JOAB CARDEN is a 68 y.o.  male with hypertension, hyperlipidemia, insomnia, BPH, kidney stones and chronic kidney disease stage III A,, who was admitted to [facility] on 09/15/2024 for Pyelonephritis [N12] Complicated UTI (urinary tract infection) [N39.0] Acute renal failure superimposed on chronic kidney disease, unspecified acute renal failure type,  unspecified CKD stage [N17.9, N18.9]  Acute kidney injury with chronic kidney disease stage IIIa. Baseline creatinine of 1.71 with GFR 43 on 07/14/24. Acute kidney injury secondary to severe left sided hydronephrosis and mild right hydronephrosis. Chronic history of renal calculi. Creatinine 2.68 on admission. Agree with urology consult. No acute need for dialysis.   2. Anemia of chronic kidney disease Lab Results  Component Value Date   HGB 11.9 (L) 09/16/2024    Hgb acceptable  3. Secondary Hyperparathyroidism: with outpatient labs: none available  Lab Results  Component Value Date   CALCIUM  8.9 09/16/2024   PHOS 3.1 07/14/2024    Will continue to monitor bone minerals  4. Hypertension with chronic kidney disease. Home regimen includes furosemide  and metoprolol . Furosemide  remains held.   LOS: 1 Kynzlie Hilleary 10/27/20252:00 PM

## 2024-09-17 DIAGNOSIS — N179 Acute kidney failure, unspecified: Secondary | ICD-10-CM | POA: Diagnosis not present

## 2024-09-17 LAB — COMPREHENSIVE METABOLIC PANEL WITH GFR
ALT: 126 U/L — ABNORMAL HIGH (ref 0–44)
AST: 67 U/L — ABNORMAL HIGH (ref 15–41)
Albumin: 3.5 g/dL (ref 3.5–5.0)
Alkaline Phosphatase: 199 U/L — ABNORMAL HIGH (ref 38–126)
Anion gap: 7 (ref 5–15)
BUN: 46 mg/dL — ABNORMAL HIGH (ref 8–23)
CO2: 23 mmol/L (ref 22–32)
Calcium: 9.1 mg/dL (ref 8.9–10.3)
Chloride: 106 mmol/L (ref 98–111)
Creatinine, Ser: 1.49 mg/dL — ABNORMAL HIGH (ref 0.61–1.24)
GFR, Estimated: 51 mL/min — ABNORMAL LOW (ref >60)
Glucose, Bld: 182 mg/dL — ABNORMAL HIGH (ref 70–99)
Potassium: 4.2 mmol/L (ref 3.5–5.1)
Sodium: 136 mmol/L (ref 135–145)
Total Bilirubin: 0.9 mg/dL (ref 0.0–1.2)
Total Protein: 7.9 g/dL (ref 6.5–8.1)

## 2024-09-17 MED ORDER — OXYCODONE HCL 5 MG PO TABS: 5.0000 mg | ORAL_TABLET | Freq: Four times a day (QID) | ORAL | Status: DC | PRN

## 2024-09-17 MED ORDER — OXYCODONE HCL 5 MG PO TABS
5.0000 mg | ORAL_TABLET | Freq: Four times a day (QID) | ORAL | Status: DC | PRN
  Administered 2024-09-17 – 2024-09-19 (×6): 5 mg via ORAL
  Filled 2024-09-17 (×6): qty 1

## 2024-09-17 NOTE — Plan of Care (Signed)
   Problem: Education: Goal: Knowledge of General Education information will improve Description: Including pain rating scale, medication(s)/side effects and non-pharmacologic comfort measures Outcome: Progressing   Problem: Clinical Measurements: Goal: Ability to maintain clinical measurements within normal limits will improve Outcome: Progressing Goal: Will remain free from infection Outcome: Progressing Goal: Cardiovascular complication will be avoided Outcome: Progressing   Problem: Coping: Goal: Level of anxiety will decrease Outcome: Progressing   

## 2024-09-17 NOTE — Progress Notes (Addendum)
 [facility]  ROUNDING NOTE   Subjective:   Patient seen sitting up in bed Eating breakfast Room air Continues to complain of right groin pain  Creatinine 1.49 Urine output   Objective:  Vital signs in last 24 hours:  Temp:  [97.6 F (36.4 C)-98.5 F (36.9 C)] 97.6 F (36.4 C) (10/28 0829) Pulse Rate:  [70-79] 74 (10/28 0829) Resp:  [12-18] 18 (10/28 0829) BP: (104-140)/(57-124) 115/57 (10/28 0829) SpO2:  [93 %-98 %] 95 % (10/28 0829) Weight:  [82.9 kg] 82.9 kg (10/27 1042)  Weight change: -6.912 kg Filed Weights   09/15/24 1438 09/16/24 1042  Weight: 89.8 kg 82.9 kg    Intake/Output: I/O last 3 completed shifts: In: 2185.6 [P.O.:240; I.V.:1845.6; IV Piggyback:100] Out: 1925 [Urine:1925]   Intake/Output this shift:  Total I/O In: -  Out: 300 [Urine:300]  Physical Exam: General: NAD  Head: Normocephalic, atraumatic. Moist oral mucosal membranes  Eyes: Anicteric  Lungs:  Clear to auscultation  Heart: Regular rate and rhythm  Abdomen:  Soft, nontender  Extremities:  No peripheral edema.  Neurologic: Awake, alert, conversant  Skin: Warm,dry, no rash       Basic Metabolic Panel: Recent Labs  Lab 09/15/24 1232 09/16/24 0501 09/17/24 0348  NA 128* 131* 136  K 4.3 4.2 4.2  CL 98 104 106  CO2 18* 18* 23  GLUCOSE 232* 195* 182*  BUN 73* 71* 46*  CREATININE 2.68* 2.93* 1.49*  CALCIUM  9.6 8.9 9.1    Liver Function Tests: Recent Labs  Lab 09/15/24 1232 09/16/24 0501 09/17/24 0348  AST 183* 97* 67*  ALT 239* 162* 126*  ALKPHOS 272* 211* 199*  BILITOT 1.6* 0.9 0.9  PROT 7.9 7.5 7.9  ALBUMIN  3.9 3.2* 3.5   Recent Labs  Lab 09/15/24 1232  LIPASE 27   No results for input(s): AMMONIA in the last 168 hours.  CBC: Recent Labs  Lab 09/15/24 1232 09/16/24 0501  WBC 5.2 6.0  HGB 13.3 11.9*  HCT 38.5* 36.2*  MCV 93.9 96.8  PLT 272 209    Cardiac Enzymes: No results for input(s): CKTOTAL, CKMB, CKMBINDEX,  TROPONINI in the last 168 hours.  BNP: Invalid input(s): POCBNP  CBG: No results for input(s): GLUCAP in the last 168 hours.  Microbiology: Results for orders placed or performed during the hospital encounter of 09/15/24  Urine Culture     Status: None (Preliminary result)   Collection Time: 09/15/24 12:48 PM   Specimen: Urine, Clean Catch  Result Value Ref Range Status   Specimen Description   Final    URINE, CLEAN CATCH Performed at [facility], [address] [place], [place] [address]    Special Requests   Final    NONE Performed at [facility], [address], [place], [place] [address]    Culture   Final    CULTURE REINCUBATED FOR BETTER GROWTH Performed at [facility], [address] [place], [place] [address]    Report Status PENDING  Incomplete    Coagulation Studies: No results for input(s): LABPROT, INR in the last 72 hours.  Urinalysis: Recent Labs    09/15/24 1248  COLORURINE YELLOW*  LABSPEC 1.013  PHURINE 5.0  GLUCOSEU NEGATIVE  HGBUR LARGE*  BILIRUBINUR NEGATIVE  KETONESUR NEGATIVE  PROTEINUR 100*  NITRITE POSITIVE*  LEUKOCYTESUR TRACE*      Imaging: CT Renal Stone Study Result Date: 09/15/2024 EXAM: CT UROGRAM 09/15/2024 01:18:26 PM TECHNIQUE: CT of the abdomen and pelvis was  performed without the administration of intravenous contrast as per CT urogram protocol. Multiplanar reformatted images as well as MIP urogram images are provided for review. Automated exposure control, iterative reconstruction, and/or weight based adjustment of the mA/kV was utilized to reduce the radiation dose to as low as reasonably achievable. COMPARISON: 07/10/2024 CLINICAL HISTORY: Abdominal/flank pain, stone suspected. R side flank and groin pain. FINDINGS: LOWER CHEST: Moderate to large hiatal hernia. LIVER: The liver is unremarkable. GALLBLADDER AND BILE DUCTS: Multiple large gallstones are identified within the gallbladder  measuring up to 3.5 cm. No pericholecystic inflammatory changes identified. No biliary ductal dilatation. SPLEEN: No acute abnormality. PANCREAS: No acute abnormality. ADRENAL GLANDS: Unchanged left adrenal gland adenoma measuring 1.6 cm, image 22/2. Right adrenal gland adenoma is also unchanged measuring 1.1 cm, image 17/2. No follow-up imaging recommended. KIDNEYS, URETERS AND BLADDER: Multiple bilateral Bosniak class 1 and 2 cysts appear unchanged. No follow-up imaging recommended. There are several stones within the lower pole of the right kidney, which measure up to 5 mm, image 36/2. Mild right hydronephrosis and hydroureter with diffuse periureteral soft tissue stranding noted. There is no stone identified along the course of the right ureter. Multiple left renal calculi are also identified. The largest stone is in the inferior pole measuring 2.3 cm. Persistent severe left hydronephrosis with diffuse cortical thinning is identified. Persistent stone within the proximal left ureter just beyond the UPJ measures 8 mm, image 47/2. No focal bladder abnormality. GI AND BOWEL: Stomach demonstrates no acute abnormality. The appendix is visualized and normal in caliber, without wall thickening, periappendiceal inflammation, or fluid. There is no bowel obstruction. PERITONEUM AND RETROPERITONEUM: No ascites. No free air. VASCULATURE: Aorta is normal in caliber. Aortic atherosclerotic calcifications. LYMPH NODES: No lymphadenopathy. REPRODUCTIVE ORGANS: Prostate gland appears normal. BONES AND SOFT TISSUES: No acute osseous abnormality. Small fat-containing left inguinal hernia. IMPRESSION: 1. Persistent severe left hydronephrosis with diffuse cortical thinning, with multiple left renal calculi including a largest stone measuring 2.3 cm, and an 8 mm stone in the proximal left ureter just beyond the UPJ. 2. Mild right hydronephrosis and hydroureter with multiple right renal stones measuring up to 5 mm, and no ureteral  stone identified. This may reflect recent past right ureteral calculus or ascending urinary tract infection. 3. Gallstones 4. Hiatal hernia Electronically signed by: Waddell Calk MD 09/15/2024 01:32 PM EDT RP Workstation: [id number]     Medications:    cefTRIAXone  (ROCEPHIN )  IV 2 g (09/17/24 0944)    aspirin   81 mg Oral Daily   finasteride   5 mg Oral Daily   heparin   5,000 Units Subcutaneous Q8H   metoprolol  succinate  25 mg Oral Daily   tamsulosin   0.4 mg Oral QPC supper   acetaminophen  **OR** acetaminophen , albuterol , dextromethorphan -guaiFENesin , hydrALAZINE , nitroGLYCERIN , ondansetron  **OR** ondansetron  (ZOFRAN ) IV, oxyCODONE , senna-docusate, traZODone   Assessment/ Plan:  Mr. Ronnie Mann is a 68 y.o.  male  with hypertension, hyperlipidemia, insomnia, BPH, kidney stones and chronic kidney disease stage III A, who was admitted to [facility] on 09/15/2024 for Pyelonephritis [N12] Complicated UTI (urinary tract infection) [N39.0] Acute renal failure superimposed on chronic kidney disease, unspecified acute renal failure type, unspecified CKD stage [N17.9, N18.9]   Acute kidney injury with chronic kidney disease stage IIIa. Baseline creatinine of 1.71 with GFR 43 on 07/14/24. Acute kidney injury secondary to severe left sided hydronephrosis and mild right hydronephrosis. Currently being treated for pyelonephritis. Urine culture report is pending Chronic history of renal calculi. Creatinine 2.68 on admission. Urology consulted  and recommend antibiotics and follow up with [facility] urology at discharge. Creatinine has returned to baseline.   Lab Results  Component Value Date   CREATININE 1.49 (H) 09/17/2024   CREATININE 2.93 (H) 09/16/2024   CREATININE 2.68 (H) 09/15/2024    Intake/Output Summary (Last 24 hours) at 09/17/2024 1035 Last data filed at 09/17/2024 0900 Gross per 24 hour  Intake 541.3 ml  Output 1625 ml  Net -1083.7 ml   2. Anemia of chronic kidney disease Lab Results   Component Value Date   HGB 11.9 (L) 09/16/2024    Hgb within desired range.   3. Secondary Hyperparathyroidism: with outpatient labs: None Lab Results  Component Value Date   CALCIUM  9.1 09/17/2024   PHOS 3.1 07/14/2024    Calcium  acceptable. PTH pending.   4.. Hypertension with chronic kidney disease. Home regimen includes furosemide  and metoprolol . Furosemide  remains held. Blood pressure optimal for this patient.   Due to renal recovery, we will sign off.    LOS: 2 Maliq Pilley 10/28/202510:35 AM  .

## 2024-09-17 NOTE — Progress Notes (Signed)
 Progress Note    Ronnie Mann  FMW:[id number] DOB: 11/13/1956  DOA: 09/15/2024 PCP: Bernardo Fend, DO      Brief Narrative:    Medical records reviewed and are as summarized below:  Ronnie Mann is a 68 y.o. 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  Vital signs in the ED: Temperature 98.2 F, respiratory 18, pulse 95, O2 sat 97% on room air, BP 110/83.  Labs significant for sodium 128, glucose 232, BUN 73, creatinine 2.68, bicarb 18, ALP 272, AST 183, ALT 239, total bilirubin 1.6, GFR 25.  WBC was normal.  Urinalysis with turbid appearance trace leukocytes, positive nitrite, many bacteria, WBCs greater than 50 hpf, RBCs greater than 50 hpf  CT renal stone IMPRESSION: 1. Persistent severe left hydronephrosis with diffuse cortical thinning, with multiple left renal calculi including a largest stone measuring 2.3 cm, and an 8 mm stone in the proximal left ureter just beyond the UPJ. 2. Mild right hydronephrosis and hydroureter with multiple right renal stones measuring up to 5 mm, and no ureteral stone identified. This may reflect recent past right ureteral calculus or ascending urinary tract infection. 3. Gallstones 4. Hiatal hernia    He he was admitted to the hospital for acute complicated UTI, AKI on CKD stage III A.     Assessment/Plan:   Principal Problem:   AKI (acute kidney injury) Active Problems:   Complicated UTI (urinary tract infection)   HLD (hyperlipidemia)   Hydronephrosis of left kidney   Essential hypertension   CAD (coronary  artery disease)   CKD stage 3a, GFR 45-59 ml/min (HCC)   Cardiomyopathy (HCC)   CAD in native artery   Calculus of kidney with calculus of ureter   Dysuria   Body mass index is 26.99 kg/m.    Acute complicated UTI/acute right pyelonephritis: Urine culture showed Staph aureus.  Follow-up susceptibility report.  Continue IV ceftriaxone .  Chart review showed that urine culture was positive for MSSA on 07/10/2024.   Intermittent gross hematuria, persistent severe left hydronephrosis, multiple left renal calculi including larger stone measuring 2.3 cm and 8 mm left proximal ureteral stone beyond the UPJ, mild right hydronephrosis and hydroureter with multiple renal stones: Continue Flomax  and finasteride . He was evaluated by urologist.  Outpatient follow-up with his primary urologist recommended. Of note, he was hospitalized from 07/10/2024 through 07/15/2023 for complicated UTI and multiple renal calculi.  Radiologist had offered cystoscopy but patient declined.   AKI on CKD stage IIIa: Improving.  Creatinine down from 2.93-1.49.  Monitor off of IV fluids.   Baseline creatinine around 1.5-1.7.  Diarrhea improved.   Chronic systolic and diastolic CHF: Compensated. Continue metoprolol .  He was not taking Entresto  prior to admission. 2D echo in August 2025 showed EF estimated at 25 to 30%, grade 1 diastolic dysfunction, severely elevated pulmonary artery systolic pressure, mild to moderate MR.   Elevated liver enzymes: Liver enzymes improving.  Lipitor on hold.     CAD with previous MI s/p coronary stent on 05/01/2023, chronically elevated troponins: Continue aspirin . Troponin was 20 on 09/16/2024.   Comorbidities include hypertension, hyperlipidemia    Diet  Order             Diet Heart Room service appropriate? Yes; Fluid consistency: Thin  Diet effective now                                   Consultants: Nephrologist Neurologist  Procedures: None    Medications:    aspirin   81 mg Oral Daily   finasteride   5 mg Oral Daily   heparin   5,000 Units Subcutaneous Q8H   metoprolol  succinate  25 mg Oral Daily   tamsulosin   0.4 mg Oral QPC supper   Continuous Infusions:  cefTRIAXone  (ROCEPHIN )  IV 2 g (09/17/24 0944)     Anti-infectives (From admission, onward)    Start     Dose/Rate Route Frequency Ordered Stop   09/16/24 1000  cefTRIAXone  (ROCEPHIN ) 2 g in sodium chloride  0.9 % 100 mL IVPB        2 g 200 mL/hr over 30 Minutes Intravenous Every 24 hours 09/15/24 1446 09/20/24 0959   09/15/24 1415  cefTRIAXone  (ROCEPHIN ) 2 g in sodium chloride  0.9 % 100 mL IVPB        2 g 200 mL/hr over 30 Minutes Intravenous  Once 09/15/24 1406 09/15/24 1538              Family Communication/Anticipated D/C date and plan/Code Status   DVT prophylaxis: heparin  injection 5,000 Units Start: 09/15/24 2200 Place TED hose Start: 09/15/24 1429     Code Status: Full Code  Family Communication: None Disposition Plan: Plan to discharge home   Status is: Inpatient Remains inpatient appropriate because: Acute complicated UTI       Subjective:   Interval events noted.  He feels better today.  Pelvic pain is better.  He complains of bloody urine but he said this is nothing new.  He said he has intermittent bloody urine because of kidney stones.  No chest pain or shortness of breath.  Objective:    Vitals:   09/16/24 1649 09/16/24 2008 09/17/24 0424 09/17/24 0829  BP: (!) 107/59 128/81 104/77 (!) 115/57  Pulse: 71 71 70 74  Resp: 16 18 18 18   Temp: 97.8 F (36.6 C) 97.7 F (36.5 C) 97.7 F (36.5 C) 97.6 F (36.4 C)  TempSrc: Oral Oral Oral   SpO2: 93% 98% 96% 95%  Weight:      Height:       No data found.   Intake/Output Summary (Last 24 hours) at 09/17/2024 1402 Last data filed at 09/17/2024 0900 Gross per 24 hour   Intake 541.3 ml  Output 1300 ml  Net -758.7 ml   Filed Weights   09/15/24 1438 09/16/24 1042  Weight: 89.8 kg 82.9 kg    Exam:  GEN: NAD SKIN: Warm and dry EYES: No pallor or icterus ENT: MMM CV: RRR PULM: CTA B ABD: soft, ND, NT, +BS CNS: AAO x 3, non focal EXT: No edema or tenderness GU: Mild right CVA tenderness       Data Reviewed:   I have personally reviewed following labs and imaging studies:  Labs: Labs show the following:   Basic Metabolic Panel: Recent Labs  Lab 09/15/24 1232 09/16/24 0501 09/17/24 0348  NA 128* 131* 136  K 4.3 4.2 4.2  CL 98 104 106  CO2 18* 18* 23  GLUCOSE 232* 195* 182*  BUN 73* 71* 46*  CREATININE 2.68* 2.93* 1.49*  CALCIUM  9.6  8.9 9.1   GFR Estimated Creatinine Clearance: 47.4 mL/min (A) (by C-G formula based on SCr of 1.49 mg/dL (H)). Liver Function Tests: Recent Labs  Lab 09/15/24 1232 09/16/24 0501 09/17/24 0348  AST 183* 97* 67*  ALT 239* 162* 126*  ALKPHOS 272* 211* 199*  BILITOT 1.6* 0.9 0.9  PROT 7.9 7.5 7.9  ALBUMIN  3.9 3.2* 3.5   Recent Labs  Lab 09/15/24 1232  LIPASE 27   No results for input(s): AMMONIA in the last 168 hours. Coagulation profile No results for input(s): INR, PROTIME in the last 168 hours.  CBC: Recent Labs  Lab 09/15/24 1232 09/16/24 0501  WBC 5.2 6.0  HGB 13.3 11.9*  HCT 38.5* 36.2*  MCV 93.9 96.8  PLT 272 209   Cardiac Enzymes: No results for input(s): CKTOTAL, CKMB, CKMBINDEX, TROPONINI in the last 168 hours. BNP (last 3 results) Recent Labs    12/21/23 1421  PROBNP 1,262*   CBG: No results for input(s): GLUCAP in the last 168 hours. D-Dimer: No results for input(s): DDIMER in the last 72 hours. Hgb A1c: No results for input(s): HGBA1C in the last 72 hours. Lipid Profile: No results for input(s): CHOL, HDL, LDLCALC, TRIG, CHOLHDL, LDLDIRECT in the last 72 hours. Thyroid function studies: No results for input(s): TSH,  T4TOTAL, T3FREE, THYROIDAB in the last 72 hours.  Invalid input(s): FREET3 Anemia work up: No results for input(s): VITAMINB12, FOLATE, FERRITIN, TIBC, IRON , RETICCTPCT in the last 72 hours. Sepsis Labs: Recent Labs  Lab 09/15/24 1232 09/16/24 0501  WBC 5.2 6.0    Microbiology Recent Results (from the past 240 hours)  Urine Culture     Status: Abnormal (Preliminary result)   Collection Time: 09/15/24 12:48 PM   Specimen: Urine, Clean Catch  Result Value Ref Range Status   Specimen Description   Final    URINE, CLEAN CATCH Performed at [facility], [address] [place], [place] [address]    Special Requests   Final    NONE Performed at [facility], [address], [place], [place] [address]    Culture (A)  Final    >=100,000 COLONIES/mL STAPHYLOCOCCUS AUREUS SUSCEPTIBILITIES TO FOLLOW Performed at [facility], [address] [place], [place] [address]    Report Status PENDING  Incomplete    Procedures and diagnostic studies:  No results found.              LOS: 2 days   Aziyah Provencal  [facility]   Pager on [email or web address]. If 7PM-7AM, please contact night-coverage at [email or web address]     09/17/2024, 2:02 PM

## 2024-09-17 NOTE — Plan of Care (Signed)
  Problem: Clinical Measurements: Goal: Ability to maintain clinical measurements within normal limits will improve Outcome: Progressing   Problem: Elimination: Goal: Will not experience complications related to bowel motility Outcome: Progressing   Problem: Pain Managment: Goal: General experience of comfort will improve and/or be controlled Outcome: Progressing   Problem: Safety: Goal: Ability to remain free from injury will improve Outcome: Progressing   Problem: Skin Integrity: Goal: Risk for impaired skin integrity will decrease Outcome: Progressing   Problem: Coping: Goal: Level of anxiety will decrease Outcome: Progressing

## 2024-09-18 DIAGNOSIS — N179 Acute kidney failure, unspecified: Secondary | ICD-10-CM | POA: Diagnosis not present

## 2024-09-18 LAB — COMPREHENSIVE METABOLIC PANEL WITH GFR
ALT: 91 U/L — ABNORMAL HIGH (ref 0–44)
AST: 43 U/L — ABNORMAL HIGH (ref 15–41)
Albumin: 3.4 g/dL — ABNORMAL LOW (ref 3.5–5.0)
Alkaline Phosphatase: 193 U/L — ABNORMAL HIGH (ref 38–126)
Anion gap: 7 (ref 5–15)
BUN: 34 mg/dL — ABNORMAL HIGH (ref 8–23)
CO2: 22 mmol/L (ref 22–32)
Calcium: 9.1 mg/dL (ref 8.9–10.3)
Chloride: 108 mmol/L (ref 98–111)
Creatinine, Ser: 1.18 mg/dL (ref 0.61–1.24)
GFR, Estimated: >60 mL/min (ref >60)
Glucose, Bld: 149 mg/dL — ABNORMAL HIGH (ref 70–99)
Potassium: 4.4 mmol/L (ref 3.5–5.1)
Sodium: 137 mmol/L (ref 135–145)
Total Bilirubin: 0.8 mg/dL (ref 0.0–1.2)
Total Protein: 7.5 g/dL (ref 6.5–8.1)

## 2024-09-18 LAB — PARATHYROID HORMONE, INTACT (NO CA): PTH: 55 pg/mL (ref 15–65)

## 2024-09-18 LAB — CBC
HCT: 34.3 % — ABNORMAL LOW (ref 39.0–52.0)
Hemoglobin: 11.5 g/dL — ABNORMAL LOW (ref 13.0–17.0)
MCH: 32 pg (ref 26.0–34.0)
MCHC: 33.5 g/dL (ref 30.0–36.0)
MCV: 95.5 fL (ref 80.0–100.0)
Platelets: 229 K/uL (ref 150–400)
RBC: 3.59 MIL/uL — ABNORMAL LOW (ref 4.22–5.81)
RDW: 12.9 % (ref 11.5–15.5)
WBC: 4.9 K/uL (ref 4.0–10.5)
nRBC: 0 % (ref 0.0–0.2)

## 2024-09-18 MED ORDER — VITAMINS A & D EX OINT: TOPICAL_OINTMENT | CUTANEOUS | Status: DC | PRN

## 2024-09-18 NOTE — Plan of Care (Signed)
  Problem: Clinical Measurements: Goal: Ability to maintain clinical measurements within normal limits will improve Outcome: Progressing   Problem: Coping: Goal: Level of anxiety will decrease Outcome: Progressing   Problem: Activity: Goal: Risk for activity intolerance will decrease Outcome: Progressing   Problem: Elimination: Goal: Will not experience complications related to bowel motility Outcome: Progressing   Problem: Safety: Goal: Ability to remain free from injury will improve Outcome: Progressing   Problem: Pain Managment: Goal: General experience of comfort will improve and/or be controlled Outcome: Progressing

## 2024-09-18 NOTE — Care Management Important Message (Signed)
 Important Message  Patient Details  Name: Ronnie Mann MRN: [id number] Date of Birth: 08/31/1956   Important Message Given:  Yes - Medicare IM     Marquise Lambson W, CMA 09/18/2024, 10:59 AM

## 2024-09-18 NOTE — Progress Notes (Signed)
 Progress Note    Ronnie Mann  FMW:[id number] DOB: [birth year]  DOA: 09/15/2024 PCP: Bernardo Fend, DO      Brief Narrative:    Medical records reviewed and are as summarized below:  Ronnie Mann is a 68 y.o. 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  Vital signs in the ED: Temperature 98.2 F, respiratory 18, pulse 95, O2 sat 97% on room air, BP 110/83.  Labs significant for sodium 128, glucose 232, BUN 73, creatinine 2.68, bicarb 18, ALP 272, AST 183, ALT 239, total bilirubin 1.6, GFR 25.  WBC was normal.  Urinalysis with turbid appearance trace leukocytes, positive nitrite, many bacteria, WBCs greater than 50 hpf, RBCs greater than 50 hpf  CT renal stone IMPRESSION: 1. Persistent severe left hydronephrosis with diffuse cortical thinning, with multiple left renal calculi including a largest stone measuring 2.3 cm, and an 8 mm stone in the proximal left ureter just beyond the UPJ. 2. Mild right hydronephrosis and hydroureter with multiple right renal stones measuring up to 5 mm, and no ureteral stone identified. This may reflect recent past right ureteral calculus or ascending urinary tract infection. 3. Gallstones 4. Hiatal hernia    He he was admitted to the hospital for acute complicated UTI, AKI on CKD stage III A.     Assessment/Plan:   Principal Problem:   AKI (acute kidney injury) Active Problems:   Complicated UTI (urinary tract infection)   HLD (hyperlipidemia)   Hydronephrosis of left kidney   Essential hypertension   CAD (coronary  artery disease)   CKD stage 3a, GFR 45-59 ml/min (HCC)   Cardiomyopathy (HCC)   CAD in native artery   Calculus of kidney with calculus of ureter   Dysuria   Body mass index is 26.99 kg/m.    Acute complicated UTI/acute right pyelonephritis: Urine culture showed Staph aureus.  I called [facility] to follow-up on urine culture.  I was informed that the urine culture report to be finalized tomorrow. Continue IV ceftriaxone . Chart review showed that urine culture was positive for MSSA on 07/10/2024.   Intermittent gross hematuria, persistent severe left hydronephrosis, multiple left renal calculi including larger stone measuring 2.3 cm and 8 mm left proximal ureteral stone beyond the UPJ, mild right hydronephrosis and hydroureter with multiple renal stones: Continue Flomax  and finasteride . He was evaluated by urologist.  Outpatient follow-up with his primary urologist recommended. Of note, he was hospitalized from 07/10/2024 through 07/15/2023 for complicated UTI and multiple renal calculi.  Radiologist had offered cystoscopy but patient declined.   AKI on CKD stage IIIa: Improved.  Creatinine down from 2.93-1.49-1.18.  Monitor off of IV fluids.   Baseline creatinine around 1.5-1.7.  Diarrhea improved.   Chronic systolic and diastolic CHF: Compensated. Continue metoprolol .  He was not taking Entresto  prior to admission. 2D echo in August 2025 showed EF estimated at 25 to 30%, grade 1 diastolic dysfunction, severely elevated pulmonary artery systolic pressure, mild to moderate MR.   Elevated liver enzymes: Improved.  Resume Lipitor at discharge   CAD with previous MI s/p coronary stent on 05/01/2023, chronically elevated troponins: Continue  aspirin . Troponin was 20 on 09/16/2024.   Comorbidities include hypertension, hyperlipidemia    Diet Order             Diet Heart Room service appropriate? Yes; Fluid consistency: Thin  Diet effective now                                   Consultants: Nephrologist Urologist  Procedures: None    Medications:    aspirin   81 mg Oral Daily   finasteride   5 mg Oral Daily   heparin   5,000 Units Subcutaneous Q8H   metoprolol  succinate  25 mg Oral Daily   tamsulosin   0.4 mg Oral QPC supper   Continuous Infusions:  cefTRIAXone  (ROCEPHIN )  IV 2 g (09/18/24 0823)     Anti-infectives (From admission, onward)    Start     Dose/Rate Route Frequency Ordered Stop   09/16/24 1000  cefTRIAXone  (ROCEPHIN ) 2 g in sodium chloride  0.9 % 100 mL IVPB        2 g 200 mL/hr over 30 Minutes Intravenous Every 24 hours 09/15/24 1446 09/20/24 0959   09/15/24 1415  cefTRIAXone  (ROCEPHIN ) 2 g in sodium chloride  0.9 % 100 mL IVPB        2 g 200 mL/hr over 30 Minutes Intravenous  Once 09/15/24 1406 09/15/24 1538              Family Communication/Anticipated D/C date and plan/Code Status   DVT prophylaxis: heparin  injection 5,000 Units Start: 09/15/24 2200 Place TED hose Start: 09/15/24 1429     Code Status: Full Code  Family Communication: None Disposition Plan: Plan to discharge home   Status is: Inpatient Remains inpatient appropriate because: Acute complicated UTI       Subjective:   Interval events noted.  Pelvic pain has improved.  No flank pain.  Overall, he feels better.  Objective:    Vitals:   09/17/24 0829 09/17/24 1932 09/18/24 0443 09/18/24 0748  BP: (!) 115/57 (!) 152/101 (!) 121/106 127/86  Pulse: 74 73 69 69  Resp: 18 17 20 17   Temp: 97.6 F (36.4 C) 97.6 F (36.4 C) 98.1 F (36.7 C) 98.1 F (36.7 C)  TempSrc:   Oral   SpO2: 95% 99% 97% 97%  Weight:      Height:       No data found.   Intake/Output Summary (Last 24 hours) at 09/18/2024 1507 Last data filed at 09/18/2024 1434 Gross per 24 hour  Intake 560.82 ml  Output 1400 ml  Net -839.18 ml   Filed Weights   09/15/24 1438 09/16/24 1042  Weight: 89.8 kg 82.9 kg    Exam:  GEN:  NAD SKIN: Warm and dry EYES: No pallor or icterus ENT: MMM CV: RRR PULM: CTA B ABD: soft, ND, NT, +BS CNS: AAO x 3, non focal EXT: No edema or tenderness GU: No CVA tenderness.  Urine in urine bottle is orange in color        Data Reviewed:   I have personally reviewed following labs and imaging studies:  Labs: Labs show the following:   Basic Metabolic Panel: Recent Labs  Lab 09/15/24 1232 09/16/24 0501 09/17/24 0348 09/18/24 0421  NA 128* 131* 136 137  K 4.3 4.2 4.2 4.4  CL 98 104 106 108  CO2 18* 18* 23 22  GLUCOSE 232* 195* 182* 149*  BUN 73* 71* 46* 34*  CREATININE 2.68*  2.93* 1.49* 1.18  CALCIUM  9.6 8.9 9.1 9.1   GFR Estimated Creatinine Clearance: 59.9 mL/min (by C-G formula based on SCr of 1.18 mg/dL). Liver Function Tests: Recent Labs  Lab 09/15/24 1232 09/16/24 0501 09/17/24 0348 09/18/24 0421  AST 183* 97* 67* 43*  ALT 239* 162* 126* 91*  ALKPHOS 272* 211* 199* 193*  BILITOT 1.6* 0.9 0.9 0.8  PROT 7.9 7.5 7.9 7.5  ALBUMIN  3.9 3.2* 3.5 3.4*   Recent Labs  Lab 09/15/24 1232  LIPASE 27   No results for input(s): AMMONIA in the last 168 hours. Coagulation profile No results for input(s): INR, PROTIME in the last 168 hours.  CBC: Recent Labs  Lab 09/15/24 1232 09/16/24 0501 09/18/24 0421  WBC 5.2 6.0 4.9  HGB 13.3 11.9* 11.5*  HCT 38.5* 36.2* 34.3*  MCV 93.9 96.8 95.5  PLT 272 209 229   Cardiac Enzymes: No results for input(s): CKTOTAL, CKMB, CKMBINDEX, TROPONINI in the last 168 hours. BNP (last 3 results) Recent Labs    12/21/23 1421  PROBNP 1,262*   CBG: No results for input(s): GLUCAP in the last 168 hours. D-Dimer: No results for input(s): DDIMER in the last 72 hours. Hgb A1c: No results for input(s): HGBA1C in the last 72 hours. Lipid Profile: No results for input(s): CHOL, HDL, LDLCALC, TRIG, CHOLHDL, LDLDIRECT in the last 72 hours. Thyroid function studies: No results for input(s):  TSH, T4TOTAL, T3FREE, THYROIDAB in the last 72 hours.  Invalid input(s): FREET3 Anemia work up: No results for input(s): VITAMINB12, FOLATE, FERRITIN, TIBC, IRON , RETICCTPCT in the last 72 hours. Sepsis Labs: Recent Labs  Lab 09/15/24 1232 09/16/24 0501 09/18/24 0421  WBC 5.2 6.0 4.9    Microbiology Recent Results (from the past 240 hours)  Urine Culture     Status: Abnormal (Preliminary result)   Collection Time: 09/15/24 12:48 PM   Specimen: Urine, Clean Catch  Result Value Ref Range Status   Specimen Description   Final    URINE, CLEAN CATCH Performed at [facility], [address], [place], [place] [address]    Special Requests   Final    NONE Performed at [facility], [address] [place], [place] [address]    Culture (A)  Final    >=100,000 COLONIES/mL STAPHYLOCOCCUS AUREUS CONFIRMATION OF SUSCEPTIBILITIES IN PROGRESS Performed at [facility], [address], [place], [place] [address]    Report Status PENDING  Incomplete    Procedures and diagnostic studies:  No results found.              LOS: 3 days   Alyna Stensland  [facility]   Pager on [email or web address]. If 7PM-7AM, please contact night-coverage at [email or web address]     09/18/2024, 3:07 PM

## 2024-09-18 NOTE — TOC Progression Note (Signed)
 Transition of Care [facility]) - Progression Note    Patient Details  Name: Ronnie Mann MRN: [id number] Date of Birth: Apr 09, 1956  Transition of Care [facility]) CM/SW Contact  Dalia GORMAN Fuse, RN Phone Number: 09/18/2024, 10:50 AM  Clinical Narrative:     Renal CT showed bilateral hydronephrosis and stones, patient also noted to have gallstones. No therapy evals at this time. Plan to discharge to home when medically appropriate.   Expected Discharge Plan: Home/Self Care Barriers to Discharge: Continued Medical Work up               Expected Discharge Plan and Services   Discharge Planning Services: CM Consult                                           Social Drivers of Health (SDOH) Interventions SDOH Screenings   Food Insecurity: No Food Insecurity (09/15/2024)  Housing: Low Risk  (09/15/2024)  Transportation Needs: No Transportation Needs (09/15/2024)  Utilities: Not At Risk (09/15/2024)  Recent Concern: Utilities - At Risk (07/12/2024)  Alcohol Screen: Low Risk  (11/27/2023)  Depression (PHQ2-9): Low Risk  (02/22/2024)  Financial Resource Strain: Low Risk  (01/23/2024)   Received from [facility]  Social Connections: Moderately Isolated (09/15/2024)  Tobacco Use: Medium Risk (09/15/2024)    Readmission Risk Interventions    09/16/2024   10:26 AM 07/12/2024   12:21 PM 11/06/2023   12:27 PM  Readmission Risk Prevention Plan  Transportation Screening Complete Complete Complete  PCP or Specialist Appt within 3-5 Days Complete Complete Complete  HRI or Home Care Consult Complete  Complete  Social Work Consult for Recovery Care Planning/Counseling Complete Complete Complete  Palliative Care Screening Not Applicable Not Applicable Not Applicable  Medication Review [employer]) Complete Referral to Pharmacy

## 2024-09-18 NOTE — Plan of Care (Signed)

## 2024-09-19 ENCOUNTER — Other Ambulatory Visit: Payer: Self-pay

## 2024-09-19 DIAGNOSIS — N179 Acute kidney failure, unspecified: Secondary | ICD-10-CM | POA: Diagnosis not present

## 2024-09-19 LAB — URINE CULTURE: Culture: >=100000 — AB

## 2024-09-19 MED ORDER — CEPHALEXIN 500 MG PO CAPS
500.0000 mg | ORAL_CAPSULE | Freq: Four times a day (QID) | ORAL | 0 refills | Status: AC
  Filled 2024-09-19: qty 40, 10d supply, fill #0

## 2024-09-19 MED ORDER — VITAMINS A & D EX OINT: TOPICAL_OINTMENT | CUTANEOUS | Status: DC | PRN

## 2024-09-19 MED ORDER — ACETAMINOPHEN 325 MG PO TABS: 650.0000 mg | ORAL_TABLET | Freq: Four times a day (QID) | ORAL | Status: AC | PRN

## 2024-09-19 MED ORDER — A+D FIRST AID 15.5-53.4 % EX OINT
TOPICAL_OINTMENT | CUTANEOUS | 0 refills | Status: AC | PRN
  Filled 2024-09-19: qty 42.5, 30d supply, fill #0

## 2024-09-19 MED ORDER — OXYCODONE HCL 5 MG PO TABS
5.0000 mg | ORAL_TABLET | Freq: Three times a day (TID) | ORAL | 0 refills | Status: AC | PRN
  Filled 2024-09-19: qty 12, 4d supply, fill #0

## 2024-09-19 NOTE — Discharge Summary (Signed)
 Physician Discharge Summary   Patient: Ronnie Mann MRN: [id number] DOB: May 11, 1956  Admit date:     09/15/2024  Discharge date: 09/19/24  Discharge Physician: AIDA CHO   PCP: Bernardo Fend, DO   Recommendations at discharge:   Follow-up with PCP in 1 week Follow-up with primary urologist as soon as possible  Discharge Diagnoses: Principal Problem:   AKI (acute kidney injury) Active Problems:   Complicated UTI (urinary tract infection)   HLD (hyperlipidemia)   Hydronephrosis of left kidney   Essential hypertension   CAD (coronary artery disease)   CKD stage 3a, GFR 45-59 ml/min (HCC)   Cardiomyopathy (HCC)   CAD in native artery   Calculus of kidney with calculus of ureter   Dysuria  Resolved Problems:   * No resolved hospital problems. *  Hospital Course:  Ronnie Mann is a 69 y.o. 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   Vital signs in the ED: Temperature 98.2 F, respiratory 18, pulse 95, O2 sat 97% on room air, BP 110/83.   Labs significant for sodium 128, glucose 232, BUN 73, creatinine 2.68, bicarb 18, ALP 272, AST 183, ALT 239, total bilirubin 1.6, GFR 25.  WBC was normal.   Urinalysis with turbid appearance trace leukocytes, positive nitrite, many bacteria, WBCs greater than 50 hpf, RBCs greater than 50 hpf   CT renal stone IMPRESSION: 1. Persistent severe left hydronephrosis with diffuse cortical thinning, with multiple left renal calculi including a largest stone measuring 2.3 cm, and an 8 mm stone in the proximal left ureter just  beyond the UPJ. 2. Mild right hydronephrosis and hydroureter with multiple right renal stones measuring up to 5 mm, and no ureteral stone identified. This may reflect recent past right ureteral calculus or ascending urinary tract infection. 3. Gallstones 4. Hiatal hernia       He he was admitted to the hospital for acute complicated UTI, AKI on CKD stage III A.   Assessment and Plan:   Acute complicated UTI/acute right pyelonephritis: Urine culture showed methicillin sensitive Staph aureus.  S/p treatment with IV ceftriaxone  for 5 days.  He will be discharged on Keflex  for 10 days to complete 2 weeks of treatment. Chart review showed that urine culture was positive for MSSA on 07/10/2024.     Intermittent gross hematuria, persistent severe left hydronephrosis, multiple left renal calculi including larger stone measuring 2.3 cm and 8 mm left proximal ureteral stone beyond the UPJ, mild right hydronephrosis and hydroureter with multiple renal stones: Continue Flomax  and finasteride . He was evaluated by urologist.  Outpatient follow-up with his primary urologist recommended. Of note, he was hospitalized from 07/10/2024 through 07/15/2023 for complicated UTI and multiple renal calculi.  Urologist had offered cystoscopy but patient declined.     AKI on CKD stage IIIa: Improved.  Creatinine down from 2.93-1.49-1.18.  Monitor off of IV fluids.   Baseline creatinine around 1.5-1.7.   Diarrhea improved.     Chronic systolic and diastolic CHF: Compensated. Continue metoprolol .  He was not taking Entresto  prior to admission. 2D echo in August 2025 showed EF estimated at 25 to 30%, grade 1 diastolic dysfunction,  severely elevated pulmonary artery systolic pressure, mild to moderate MR.     Elevated liver enzymes: Improved.  Resume Lipitor at discharge     CAD with previous MI s/p coronary stent on 05/01/2023, chronically elevated troponins: Continue aspirin . Troponin was 20 on 09/16/2024.      Comorbidities include hypertension, hyperlipidemia               Consultants: Urologist Procedures performed: None Disposition: Home Diet recommendation:  Discharge Diet Orders (From admission, onward)     Start     Ordered   09/19/24 0000  Diet - low sodium heart healthy        09/19/24 1025           Cardiac diet DISCHARGE MEDICATION: Allergies as of 09/19/2024       Reactions   Losartan  Other (See Comments)   Chest pain        Medication List     STOP taking these medications    Entresto  24-26 MG Generic drug: sacubitril -valsartan    Fe Fum-Vit C-Vit B12-FA Caps capsule Commonly known as: TRIGELS-F FORTE   furosemide  20 MG tablet Commonly known as: LASIX    oxyCODONE -acetaminophen  5-325 MG tablet Commonly known as: PERCOCET/ROXICET   solifenacin 10 MG tablet Commonly known as: VESICARE       TAKE these medications    acetaminophen  325 MG tablet Commonly known as: TYLENOL  Take 2 tablets (650 mg total) by mouth every 6 (six) hours as needed for mild pain (pain score 1-3), fever or headache (or Fever >/= 101).   albuterol  108 (90 Base) MCG/ACT inhaler Commonly known as: VENTOLIN  HFA Inhale 2 puffs into the lungs every 6 (six) hours as needed for wheezing or shortness of breath.   Aspirin  Low Dose 81 MG chewable tablet Generic drug: aspirin  Chew 1 tablet (81 mg total) by mouth daily.   atorvastatin  40 MG tablet Commonly known as: LIPITOR Take 1 tablet (40 mg total) by mouth daily.   cephALEXin  500 MG capsule Commonly known as: KEFLEX  Take 1 capsule (500 mg total) by mouth 4 (four) times daily for 10 days.   finasteride  5 MG tablet Commonly known as: PROSCAR  Take 1 tablet (5 mg total) by mouth daily.   metoprolol  succinate 25 MG 24 hr tablet Commonly known as: TOPROL -XL Take 1 tablet (25 mg total) by mouth daily.   Mucinex  DM 30-600 MG Tb12 Take 1 tablet by mouth 2 (two) times daily as needed for cough.   nitroGLYCERIN  0.4 MG  SL tablet Commonly known as: NITROSTAT  Place 1 tablet (0.4 mg total) under the tongue every 5 (five) minutes as needed for chest pain.   oxyCODONE  5 MG immediate release tablet Commonly known as: Oxy IR/ROXICODONE  Take 1 tablet (5 mg total) by mouth every 8 (eight) hours as needed for moderate pain (pain score 4-6).   tamsulosin  0.4 MG Caps capsule Commonly known as: FLOMAX  Take 1 capsule (0.4 mg total) by mouth daily after supper.   traZODone  50 MG tablet Commonly known as: DESYREL  Take 1 tablet (50 mg total) by mouth at bedtime as needed for sleep.   vitamin A & D ointment Apply topically as needed for dry skin.        Follow-up Information     Bernardo Fend, DO Follow up.   Specialty: Internal Medicine Why: hospital follow up Contact information: [address] [place] [address] [phone]                Discharge  Exam: Filed Weights   09/15/24 1438 09/16/24 1042  Weight: 89.8 kg 82.9 kg   GEN: NAD SKIN: Warm and dry EYES: No pallor or icterus ENT: MMM CV: RRR PULM: CTA B ABD: soft, ND, mild suprapubic tenderness, +BS CNS: AAO x 3, non focal EXT: No edema or tenderness GU: No CVA tenderness   Condition at discharge: good  The results of significant diagnostics from this hospitalization (including imaging, microbiology, ancillary and laboratory) are listed below for reference.   Imaging Studies: CT Renal Stone Study Result Date: 09/15/2024 EXAM: CT UROGRAM 09/15/2024 01:18:26 PM TECHNIQUE: CT of the abdomen and pelvis was performed without the administration of intravenous contrast as per CT urogram protocol. Multiplanar reformatted images as well as MIP urogram images are provided for review. Automated exposure control, iterative reconstruction, and/or weight based adjustment of the mA/kV was utilized to reduce the radiation dose to as low as reasonably achievable. COMPARISON: 07/10/2024 CLINICAL HISTORY: Abdominal/flank  pain, stone suspected. R side flank and groin pain. FINDINGS: LOWER CHEST: Moderate to large hiatal hernia. LIVER: The liver is unremarkable. GALLBLADDER AND BILE DUCTS: Multiple large gallstones are identified within the gallbladder measuring up to 3.5 cm. No pericholecystic inflammatory changes identified. No biliary ductal dilatation. SPLEEN: No acute abnormality. PANCREAS: No acute abnormality. ADRENAL GLANDS: Unchanged left adrenal gland adenoma measuring 1.6 cm, image 22/2. Right adrenal gland adenoma is also unchanged measuring 1.1 cm, image 17/2. No follow-up imaging recommended. KIDNEYS, URETERS AND BLADDER: Multiple bilateral Bosniak class 1 and 2 cysts appear unchanged. No follow-up imaging recommended. There are several stones within the lower pole of the right kidney, which measure up to 5 mm, image 36/2. Mild right hydronephrosis and hydroureter with diffuse periureteral soft tissue stranding noted. There is no stone identified along the course of the right ureter. Multiple left renal calculi are also identified. The largest stone is in the inferior pole measuring 2.3 cm. Persistent severe left hydronephrosis with diffuse cortical thinning is identified. Persistent stone within the proximal left ureter just beyond the UPJ measures 8 mm, image 47/2. No focal bladder abnormality. GI AND BOWEL: Stomach demonstrates no acute abnormality. The appendix is visualized and normal in caliber, without wall thickening, periappendiceal inflammation, or fluid. There is no bowel obstruction. PERITONEUM AND RETROPERITONEUM: No ascites. No free air. VASCULATURE: Aorta is normal in caliber. Aortic atherosclerotic calcifications. LYMPH NODES: No lymphadenopathy. REPRODUCTIVE ORGANS: Prostate gland appears normal. BONES AND SOFT TISSUES: No acute osseous abnormality. Small fat-containing left inguinal hernia. IMPRESSION: 1. Persistent severe left hydronephrosis with diffuse cortical thinning, with multiple left renal  calculi including a largest stone measuring 2.3 cm, and an 8 mm stone in the proximal left ureter just beyond the UPJ. 2. Mild right hydronephrosis and hydroureter with multiple right renal stones measuring up to 5 mm, and no ureteral stone identified. This may reflect recent past right ureteral calculus or ascending urinary tract infection. 3. Gallstones 4. Hiatal hernia Electronically signed by: Waddell Calk MD 09/15/2024 01:32 PM EDT RP Workstation: [id number]    Microbiology: Results for orders placed or performed during the hospital encounter of 09/15/24  Urine Culture     Status: Abnormal   Collection Time: 09/15/24 12:48 PM   Specimen: Urine, Clean Catch  Result Value Ref Range Status   Specimen Description   Final    URINE, CLEAN CATCH Performed at [facility], [address] [place], [place] [address]    Special Requests   Final    NONE Performed at [facility], [address] [place], [place] [address]    Culture >=100,000 COLONIES/mL STAPHYLOCOCCUS AUREUS (A)  Final   Report Status 09/19/2024 FINAL  Final   Organism ID, Bacteria STAPHYLOCOCCUS AUREUS (A)  Final      Susceptibility   Staphylococcus aureus - MIC*    CIPROFLOXACIN <=0.5 SENSITIVE Sensitive     GENTAMICIN <=0.5 SENSITIVE Sensitive     NITROFURANTOIN <=16 SENSITIVE Sensitive     OXACILLIN 0.5 SENSITIVE Sensitive     TETRACYCLINE <=1 SENSITIVE Sensitive     VANCOMYCIN  1 SENSITIVE Sensitive     TRIMETH /SULFA  <=10 SENSITIVE Sensitive     RIFAMPIN <=0.5 SENSITIVE Sensitive     Inducible Clindamycin NEGATIVE Sensitive     LINEZOLID 2 SENSITIVE Sensitive     * >=100,000 COLONIES/mL STAPHYLOCOCCUS AUREUS    Labs: CBC: Recent Labs  Lab 09/15/24 1232 09/16/24 0501 09/18/24 0421  WBC 5.2 6.0 4.9  HGB 13.3 11.9* 11.5*  HCT 38.5* 36.2* 34.3*  MCV 93.9 96.8 95.5  PLT 272 209 229   Basic Metabolic Panel: Recent Labs  Lab 09/15/24 1232 09/16/24 0501 09/17/24 0348 09/18/24 0421   NA 128* 131* 136 137  K 4.3 4.2 4.2 4.4  CL 98 104 106 108  CO2 18* 18* 23 22  GLUCOSE 232* 195* 182* 149*  BUN 73* 71* 46* 34*  CREATININE 2.68* 2.93* 1.49* 1.18  CALCIUM  9.6 8.9 9.1 9.1   Liver Function Tests: Recent Labs  Lab 09/15/24 1232 09/16/24 0501 09/17/24 0348 09/18/24 0421  AST 183* 97* 67* 43*  ALT 239* 162* 126* 91*  ALKPHOS 272* 211* 199* 193*  BILITOT 1.6* 0.9 0.9 0.8  PROT 7.9 7.5 7.9 7.5  ALBUMIN  3.9 3.2* 3.5 3.4*   CBG: No results for input(s): GLUCAP in the last 168 hours.  Discharge time spent: greater than 30 minutes.  Signed: AIDA CHO, MD [facility] 09/19/2024

## 2024-09-23 ENCOUNTER — Telehealth: Payer: Self-pay | Admitting: Family

## 2024-09-23 NOTE — Telephone Encounter (Signed)
 Called to confirm/remind patient of their appointment at [facility] on 09/24/24.   Appointment:   [] Confirmed  [] Left mess   [] No answer/No voice mail  [x] VM Full/unable to leave message  [] Phone not in service  Patient reminded to bring all medications and/or complete list.  Confirmed patient has transportation. Gave directions, instructed to utilize valet parking.

## 2024-09-24 ENCOUNTER — Inpatient Hospital Stay: Admitting: Family

## 2024-09-24 ENCOUNTER — Telehealth: Payer: Self-pay | Admitting: Family

## 2024-09-24 NOTE — Telephone Encounter (Signed)
 Patient did not show for [facility] appointment on  12/05/23.

## 2024-09-24 NOTE — Progress Notes (Deleted)
 Advanced Heart Failure Clinic Note    PCP: Bernardo Fend, DO  Primary Cardiologist: Evalene Lunger, MD  Chief Complaint: shortness of breath   HPI:  Mr Rineer is a 68 y/o male with a history of NSTEMI, s/p PCI to proximal LAD and RCA on 05/01/23, HFrEF (EF 30 to 35%), previous tobacco/ alcohol use, HTN, CKD, UTI, hyperlipidemia, kidney stones since the age of 41, right uretal stone s/p stent 10/12/23, bilateral nephrolithiasis and pulmonary embolus (03/25).   Admitted 04/28/23 due to new onset of substernal vice like substernal chest pain that radiated to his left arm. He reports ongoing shortness of breath associated with this. He also was awoken from sleep with this last night and had broken out into a cold sweat. Elevated lactic acid and elevated WBC at 14.4. 1 L of IVF given. Initial troponin was 54 rose to 234. 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 Developed flash pulmonary edema during left heart cath 06/10 and was treated with nitroglycerin  drip. Diuretics held. Status post successful balloon angioplasty to the proximal LAD and drug-eluting stent placement to the proximal right coronary artery. Treated for pneumonia with antibiotics. Was in the ED 05/13/23 due to bilateral flank and suprapubic pain. CT renal was stable. Admitted 05/16/23 due to shortness of breath, bilateral flank pain radiating towards lower abdomen, more on right than left, dysuria and gross hematuria. Patient deferred urology stent placement. Able to void after foley removed. Brilinta  was switched to Plavix , aspirin  discontinued in the setting of hematuria by cardiologist    Admitted 10/10/23 due to shortness of breath, hematuria and flank pain. Started on IV lasix  for HF exacerbation. Cardiology consulted and feel respiratory symptoms more related to bronchitis. Lasix  held and solu-medrol  provided. Respiratory panel negative. Urology consulted and stent placed. Losartan  was  changed to entresto .   Admitted 11/04/23 due to right flank pain. RUQ sono showed cholelithiasis with positive sonographic Murphy's borderline thickening of gallbladder wall at fundus suspicious for acute cholecystitis.  CT renal stone study showed right ureteral stent in position, persistent calculi in right ureter, pelvis and proximal mid left ureter.  Surgery team evaluated the patient in the ED, advised HIDA scan which ruled out acute cholecystitis. Entresto , lasix  and spironolactone  held due to low BP. To follow up with [facility] urology early January 2025.  Has had 2 ED visits 01/25 due to pain from his kidney stone  Had PCNL and ureteral stent placed 01/22/24 complicated by intraoperative pneumothorax requiring right-sided chest tube placement with Interventional Radiology on 01/22/2024 at [facility]. He also received pressors intraoperatively for which IV was infiltrated with concern for right forearm necrosis. Had episode of sudden chest pain radiating to the left arm. The patient had an equivocal EKG with possible ST elevations and Troponins in the 700s. Cardiology service was consulted for their recommendations, and there was no acute intervention for cath lab. Troponins peaked later that night to the 2000s and later downtrended to 1800s. On POD4 the pigtail chest tube was removed with stable CXR, post-pull chest XR was stable.     Admitted 01/30/24 with sudden onset of chest pain localized in the left side chest radiate to the left arm-8:30 AM yesterday. He took some nitroglycerin , and slept. He had another episode of chest pain today at 9:30 AM, was described as a sharp, persistent. 10/10 in severity. Radiate to the left arm. Worsening shortness of breath where he has to sleep in the recliner. Troponins only mildly elevated  and stable. Cardiology consulted, does not think this is ACS. VQ scan pursued which shows acute pulmonary embolus (segmental in LLL and subsegmental in RLL. Heparin  drip  started. Urology consulted. Started on apixaban .   Was in the ER 02/05/24 due to gross hematuria. CT showed no new ureteral stones or any evidence of clots in the bladder. Urology consulted and said to remove the stent. Stent removed with immediate improvement of symptoms.   Most recent ED visit 02/14/24 with right chest pain/ flank pain. CT shows mild area of uncomplicated colitis in the ascending colon.   He came to office 02/19/24 to have staff place zio and he c/o chest pain. BP was low. Encouraged ER but patient refused. Spironolactone  was stopped.   Admitted 05/10/24 with shortness of breath. Has associated fatigue, chest pain (usually at night), rhinorrhea, cough, abdominal distention on right lower side. Has gained 7 pounds since discharge 3 days ago   Seen in [facility] 06/25 where ReDs was elevated and symptoms worse. Entresto  was stopped due to hypotension, furosemide  was increased to 40mg  daily. Offered IV lasix  but he deferred.  Admitted 07/10/24 and 09/15/24 for urinary complication    He presents today for a HF f/u visit with a chief complaint of shortness of breath. Has associated fatigue, abdominal distention/ pain, dark stools,intermittent dizziness, cloudy urine and pain where nephrostomy tube was located at. Denies chest pain, palpitations, pedal edema. He did stop his entresto  at last visit and increased his furosemide  to 40mg  daily but without any change in his symptoms.   When asked about fluid intake he says that he's drinking too much. Drinks 2-3 bottles of water and 2-3 large glasses of juice and 1 pepsi daily.   He was supposed to receive iron  infusion 05/20/24 along with IV lasix  after he called 2 days ago. Patient says that he didn't know anything about it so didn't get either treatment.   Previous cardiac studies:   Echo 04/30/23: EF 30-35% along with mild LVH and Grade II DD and moderate MR.  Echo 10/13/23: EF 25-30% with moderate LAE, moderate MR Echo 07/14/24 EF 25-30%,  LV severe hypokinesis and mild LVH and RV normal.   LHC 05/01/23:   Prox LAD to Mid LAD lesion is 99% stenosed.   Mid LAD lesion is 60% stenosed.   1st Diag lesion is 70% stenosed.   Prox RCA lesion is 95% stenosed.   Mid RCA lesion is 30% stenosed.   RPDA lesion is 60% stenosed.   Dist LAD lesion is 99% stenosed.   A drug-eluting stent was successfully placed using a STENT ONYX FRONTIER 4.0X15.   Balloon angioplasty was performed using a BALLN [place] EUPHORA RX 2.75X20.   Post intervention, there is a 20% residual stenosis.   Post intervention, there is a 0% residual stenosis.   There is severe left ventricular systolic dysfunction.   LV end diastolic pressure is severely elevated.   There is moderate (3+) mitral regurgitation.  1.  Severe two-vessel coronary artery disease with subtotal occlusion of the proximal LAD, diffuse moderate mid LAD disease and severe distal LAD disease.  In addition, there is 95% in the proximal right coronary artery with faint right to left collaterals to the LAD.  The left circumflex has mild nonobstructive disease and OM 3 is large and reaches the apex. 2.  Severely reduced LV systolic function with an EF of 25%.  Severely elevated left ventricular end-diastolic pressure at 34 mmHg. 3.  Successful balloon angioplasty to the  proximal LAD and drug-eluting stent placement to the proximal right coronary artery.   ROS: All systems negative except as listed in HPI, PMH and Problem List.  SH:  Social History   Socioeconomic History   Marital status: Divorced    Spouse name: Not on file   Number of children: Not on file   Years of education: Not on file   Highest education level: 10th grade  Occupational History   Not on file  Tobacco Use   Smoking status: Former    Types: Cigarettes    Passive exposure: Never   Smokeless tobacco: Never  Vaping Use   Vaping status: Never Used  Substance and Sexual Activity   Alcohol use: Not Currently    Comment: former  moderate drinker   Drug use: Yes    Types: Marijuana   Sexual activity: Not Currently  Other Topics Concern   Not on file  Social History Narrative   Not on file   Social Drivers of Health   Financial Resource Strain: Low Risk  (01/23/2024)   Received from [facility]   Overall Financial Resource Strain (CARDIA)    Difficulty of Paying Living Expenses: Not hard at all  Food Insecurity: No Food Insecurity (09/15/2024)   Hunger Vital Sign    Worried About Running Out of Food in the Last Year: Never true    Ran Out of Food in the Last Year: Never true  Transportation Needs: No Transportation Needs (09/15/2024)   PRAPARE - Administrator, Civil Service (Medical): No    Lack of Transportation (Non-Medical): No  Physical Activity: Not on file  Stress: Not on file  Social Connections: Moderately Isolated (09/15/2024)   Social Connection and Isolation Panel    Frequency of Communication with Friends and Family: More than three times a week    Frequency of Social Gatherings with Friends and Family: More than three times a week    Attends Religious Services: More than 4 times per year    Active Member of [employer] or Organizations: No    Attends Banker Meetings: Never    Marital Status: Divorced  Catering Manager Violence: Not At Risk (09/15/2024)   Humiliation, Afraid, Rape, and Kick questionnaire    Fear of Current or Ex-Partner: No    Emotionally Abused: No    Physically Abused: No    Sexually Abused: No    FH:  Family History  Problem Relation Age of Onset   Heart disease Father     Past Medical History:  Diagnosis Date   CHF (congestive heart failure) (HCC)    Coronary artery disease 05/01/2023   PCI/DES   HFrEF (heart failure with reduced ejection fraction) (HCC) 05/01/2023   Hiatal hernia    HLD (hyperlipidemia)    HTN (hypertension)    Ischemic cardiomyopathy 05/01/2023   LVEF 30-35%   Kidney stones    Pneumothorax     Current  Outpatient Medications  Medication Sig Dispense Refill   acetaminophen  (TYLENOL ) 325 MG tablet Take 2 tablets (650 mg total) by mouth every 6 (six) hours as needed for mild pain (pain score 1-3), fever or headache (or Fever >/= 101).     albuterol  (VENTOLIN  HFA) 108 (90 Base) MCG/ACT inhaler Inhale 2 puffs into the lungs every 6 (six) hours as needed for wheezing or shortness of breath. 17 each 0   aspirin  81 MG chewable tablet Chew 1 tablet (81 mg total) by mouth daily. 90 tablet 2  atorvastatin  (LIPITOR) 40 MG tablet Take 1 tablet (40 mg total) by mouth daily. 90 tablet 3   cephALEXin  (KEFLEX ) 500 MG capsule Take 1 capsule (500 mg total) by mouth 4 (four) times daily for 10 days. 40 capsule 0   dextromethorphan -guaiFENesin  (MUCINEX  DM) 30-600 MG 12hr tablet Take 1 tablet by mouth 2 (two) times daily as needed for cough. 30 tablet 0   finasteride  (PROSCAR ) 5 MG tablet Take 1 tablet (5 mg total) by mouth daily. 30 tablet 2   metoprolol  succinate (TOPROL -XL) 25 MG 24 hr tablet Take 1 tablet (25 mg total) by mouth daily. 90 tablet 3   nitroGLYCERIN  (NITROSTAT ) 0.4 MG SL tablet Place 1 tablet (0.4 mg total) under the tongue every 5 (five) minutes as needed for chest pain. 25 tablet 5   oxyCODONE  (OXY IR/ROXICODONE ) 5 MG immediate release tablet Take 1 tablet (5 mg total) by mouth every 8 (eight) hours as needed for moderate pain (pain score 4-6). 12 tablet 0   tamsulosin  (FLOMAX ) 0.4 MG CAPS capsule Take 1 capsule (0.4 mg total) by mouth daily after supper. 30 capsule 2   traZODone  (DESYREL ) 50 MG tablet Take 1 tablet (50 mg total) by mouth at bedtime as needed for sleep. 90 tablet 0   lanolin-petrolatum (A+D FIRST AID) 15.5-53.4 % OINT Apply topically as needed. 42.5 g 0   No current facility-administered medications for this visit.   There were no vitals filed for this visit.  Wt Readings from Last 3 Encounters:  09/16/24 82.9 kg  07/14/24 87.2 kg  05/22/24 87.1 kg   Lab Results  Component  Value Date   CREATININE 1.18 09/18/2024   CREATININE 1.49 (H) 09/17/2024   CREATININE 2.93 (H) 09/16/2024    PHYSICAL EXAM:  General: Ill appearing. No resp difficulty HEENT: normal Neck: supple, no JVD Cor: Regular rhythm, rate. No rubs, gallops or murmurs Lungs: clear Abdomen: soft, nontender, nondistended, + swelling right abdominal side. Extremities: no cyanosis, clubbing, rash, edema Neuro: alert & oriented X 3. Moves all 4 extremities w/o difficulty. Affect pleasant  ECG: not done  ReDs reading: 43 %, abnormal (see plan below)   ASSESSMENT & PLAN:  1: Ischemic heart failure with reduced ejection fraction- - NSTEMI with PCI 06/24 - NYHA class III - moderately fluid up with elevated ReDs, symptoms - ReDs 43%: 5 days ago it was 42% - will send for IV lasix  and patient is agreeable today - weight stable from last visit here 5 days ago - Echo 04/30/23: EF 30-35% along with mild LVH and Grade II DD and moderate MR.  - Echo 10/13/23: EF 25-30% with moderate LAE, moderate MR - will get echo updated as we're having to dial back GDMT due to hyptension - may need repeat cath or cMRI to further evaluate - continue furosemide  40mg  daily; consider changing to torsemide to see if he gets better response - continue metoprolol  succinate 25mg  daily - entresto  stopped due to hypotension - spiro was previously stopped due to hypotension - due to chronic kidney stone; will not be a good candidate for SGLT2 - reviewed the importance of keeping daily fluid intake to 60-64 ounces and to measure how many ounces his cup holds; /he estimates the cup holds 20 oz. Based on that his daily fluid intake ranges from 92-158 ounces - BNP 8//20/25 was 203.8 - BNP today  2: CAD- - was NS with cardiology [place]) 08/24; cardiology referral placed again today - continue atorvastatin  40mg  daily - LDL 05/17/24  was 51 - LHC 05/01/23:   Prox LAD to Mid LAD lesion is 99% stenosed.   Mid LAD lesion is 60%  stenosed.   1st Diag lesion is 70% stenosed.   Prox RCA lesion is 95% stenosed.   Mid RCA lesion is 30% stenosed.   RPDA lesion is 60% stenosed.   Dist LAD lesion is 99% stenosed.   A drug-eluting stent was successfully placed using a STENT ONYX FRONTIER 4.0X15.   Balloon angioplasty was performed using a BALLN [place] EUPHORA RX 2.75X20.   Post intervention, there is a 20% residual stenosis.   Post intervention, there is a 0% residual stenosis.   There is severe left ventricular systolic dysfunction.   LV end diastolic pressure is severely elevated.   There is moderate (3+) mitral regurgitation.  1.  Severe two-vessel coronary artery disease with subtotal occlusion of the proximal LAD, diffuse moderate mid LAD disease and severe distal LAD disease.  In addition, there is 95% in the proximal right coronary artery with faint right to left collaterals to the LAD.  The left circumflex has mild nonobstructive disease and OM 3 is large and reaches the apex. 2.  Severely reduced LV systolic function with an EF of 25%.  Severely elevated left ventricular end-diastolic pressure at 34 mmHg. 3.  Successful balloon angioplasty to the proximal LAD and drug-eluting stent placement to the proximal right coronary artery.  3: HTN- - BP 92/53 - saw PCP Neill) 04/25 - BMP 610/29/25 reviewed: sodium 137, potassium 4.4, creatinine 1.18 & GFR >60 - BMET today  4: Renal stone- - followed by [facility] urology (last seen 01/25) - has bilateral staghorn renal stones  - right ureteral stent placed 10/12/23  - PCNL and urethral stent done 01/22/24 with subsequent complication of pneumothorax - urethral stent removed by EDP on 02/05/24 with improvement of severe pain - emphasized that he reach back out to urology or to his PCP regarding his cloudy urine and pain where nephrostomy tube was  5: SVT- - zio 03/25 showed frequent episodes of SVT - has EP appt 08/25  6: Anemia- - ferritin level 05/11/24 was 17 - going to get  iron  infusion today  - Hg 05/17/24 was 9.4 - CBC today as he's been having dark stools at home - he's picking up his oral iron  today   Return next week to see HF MD, sooner if needed.   Solomon Blumenthal, RN 09/24/24

## 2024-10-26 ENCOUNTER — Other Ambulatory Visit: Payer: Self-pay | Admitting: Family

## 2024-11-20 ENCOUNTER — Telehealth: Payer: Self-pay | Admitting: Family

## 2024-11-20 NOTE — Telephone Encounter (Signed)
 Called to r/s appt from 11/4
